# Patient Record
Sex: Male | Born: 1935
Health system: Southern US, Community
[De-identification: ages and names within clinical notes are randomized; demographics above are authoritative.]

## PROBLEM LIST (undated history)

## (undated) DIAGNOSIS — L03116 Cellulitis of left lower limb: Secondary | ICD-10-CM

## (undated) DIAGNOSIS — D591 Autoimmune hemolytic anemia, unspecified: Secondary | ICD-10-CM

## (undated) DIAGNOSIS — E559 Vitamin D deficiency, unspecified: Secondary | ICD-10-CM

## (undated) DIAGNOSIS — I10 Essential (primary) hypertension: Secondary | ICD-10-CM

## (undated) DIAGNOSIS — F32A Depression, unspecified: Secondary | ICD-10-CM

## (undated) DIAGNOSIS — I255 Ischemic cardiomyopathy: Secondary | ICD-10-CM

## (undated) DIAGNOSIS — R634 Abnormal weight loss: Secondary | ICD-10-CM

## (undated) DIAGNOSIS — I219 Acute myocardial infarction, unspecified: Secondary | ICD-10-CM

## (undated) DIAGNOSIS — I509 Heart failure, unspecified: Secondary | ICD-10-CM

## (undated) DIAGNOSIS — Z974 Presence of external hearing-aid: Secondary | ICD-10-CM

## (undated) DIAGNOSIS — I4891 Unspecified atrial fibrillation: Secondary | ICD-10-CM

## (undated) DIAGNOSIS — I679 Cerebrovascular disease, unspecified: Secondary | ICD-10-CM

## (undated) DIAGNOSIS — G4733 Obstructive sleep apnea (adult) (pediatric): Secondary | ICD-10-CM

## (undated) DIAGNOSIS — Z973 Presence of spectacles and contact lenses: Secondary | ICD-10-CM

## (undated) DIAGNOSIS — F329 Major depressive disorder, single episode, unspecified: Secondary | ICD-10-CM

## (undated) DIAGNOSIS — C44300 Unspecified malignant neoplasm of skin of unspecified part of face: Secondary | ICD-10-CM

## (undated) DIAGNOSIS — M1990 Unspecified osteoarthritis, unspecified site: Secondary | ICD-10-CM

## (undated) DIAGNOSIS — N189 Chronic kidney disease, unspecified: Secondary | ICD-10-CM

## (undated) DIAGNOSIS — L02419 Cutaneous abscess of limb, unspecified: Secondary | ICD-10-CM

## (undated) DIAGNOSIS — R599 Enlarged lymph nodes, unspecified: Secondary | ICD-10-CM

## (undated) DIAGNOSIS — E119 Type 2 diabetes mellitus without complications: Secondary | ICD-10-CM

## (undated) DIAGNOSIS — Z9989 Dependence on other enabling machines and devices: Secondary | ICD-10-CM

## (undated) DIAGNOSIS — G2581 Restless legs syndrome: Secondary | ICD-10-CM

## (undated) DIAGNOSIS — Z95810 Presence of automatic (implantable) cardiac defibrillator: Secondary | ICD-10-CM

## (undated) DIAGNOSIS — Z9189 Other specified personal risk factors, not elsewhere classified: Secondary | ICD-10-CM

## (undated) DIAGNOSIS — R351 Nocturia: Secondary | ICD-10-CM

## (undated) DIAGNOSIS — E785 Hyperlipidemia, unspecified: Secondary | ICD-10-CM

## (undated) DIAGNOSIS — L03119 Cellulitis of unspecified part of limb: Secondary | ICD-10-CM

## (undated) DIAGNOSIS — C4490 Unspecified malignant neoplasm of skin, unspecified: Secondary | ICD-10-CM

## (undated) HISTORY — PX: COLONOSCOPY W/ BIOPSIES AND POLYPECTOMY: SHX1376

## (undated) HISTORY — DX: Abnormal weight loss: R63.4

## (undated) HISTORY — DX: Nocturia: R35.1

## (undated) HISTORY — DX: Restless legs syndrome: G25.81

## (undated) HISTORY — DX: Hyperlipidemia, unspecified: E78.5

## (undated) HISTORY — DX: Cerebrovascular disease, unspecified: I67.9

## (undated) HISTORY — DX: Cellulitis of left lower limb: L03.116

## (undated) HISTORY — DX: Vitamin D deficiency, unspecified: E55.9

## (undated) HISTORY — DX: Other autoimmune hemolytic anemias: D59.1

## (undated) HISTORY — DX: Type 2 diabetes mellitus without complications: E11.9

## (undated) HISTORY — PX: CATARACT EXTRACTION W/ INTRAOCULAR LENS  IMPLANT, BILATERAL: SHX1307

## (undated) HISTORY — PX: MOHS SURGERY: SUR867

## (undated) HISTORY — DX: Essential (primary) hypertension: I10

## (undated) HISTORY — DX: Enlarged lymph nodes, unspecified: R59.9

## (undated) HISTORY — DX: Ischemic cardiomyopathy: I25.5

## (undated) HISTORY — DX: Autoimmune hemolytic anemia, unspecified: D59.10

## (undated) HISTORY — DX: Other specified personal risk factors, not elsewhere classified: Z91.89

## (undated) HISTORY — PX: CARDIAC CATHETERIZATION: SHX172

## (undated) HISTORY — DX: Unspecified atrial fibrillation: I48.91

---

## 1980-03-19 HISTORY — PX: CHOLECYSTECTOMY: SHX55

## 1980-03-19 HISTORY — PX: APPENDECTOMY: SHX54

## 1998-11-07 ENCOUNTER — Other Ambulatory Visit: Payer: Self-pay | Admitting: Plastic Surgery

## 2001-04-03 ENCOUNTER — Encounter (INDEPENDENT_AMBULATORY_CARE_PROVIDER_SITE_OTHER): Payer: Self-pay | Admitting: Gastroenterology

## 2003-03-20 DIAGNOSIS — I219 Acute myocardial infarction, unspecified: Secondary | ICD-10-CM

## 2003-03-20 HISTORY — PX: CARDIAC DEFIBRILLATOR PLACEMENT: SHX171

## 2003-03-20 HISTORY — DX: Acute myocardial infarction, unspecified: I21.9

## 2003-03-20 HISTORY — PX: CORONARY ARTERY BYPASS GRAFT: SHX141

## 2003-03-20 HISTORY — PX: VENTRICULAR RESECTION / REPAIR ANEURYSM: SUR1434

## 2003-06-10 ENCOUNTER — Encounter (INDEPENDENT_AMBULATORY_CARE_PROVIDER_SITE_OTHER): Payer: Self-pay | Admitting: Gastroenterology

## 2003-06-10 ENCOUNTER — Emergency Department (HOSPITAL_COMMUNITY): Admission: EM | Admit: 2003-06-10 | Discharge: 2003-06-10 | Payer: Self-pay | Admitting: Emergency Medicine

## 2003-09-06 ENCOUNTER — Inpatient Hospital Stay (HOSPITAL_COMMUNITY): Admission: EM | Admit: 2003-09-06 | Discharge: 2003-09-20 | Payer: Self-pay | Admitting: *Deleted

## 2003-09-07 ENCOUNTER — Encounter: Payer: Self-pay | Admitting: Cardiology

## 2003-09-10 ENCOUNTER — Encounter: Payer: Self-pay | Admitting: Cardiology

## 2003-09-13 ENCOUNTER — Encounter (INDEPENDENT_AMBULATORY_CARE_PROVIDER_SITE_OTHER): Payer: Self-pay | Admitting: Specialist

## 2003-10-18 ENCOUNTER — Encounter (HOSPITAL_COMMUNITY): Admission: RE | Admit: 2003-10-18 | Discharge: 2004-01-16 | Payer: Self-pay | Admitting: Cardiology

## 2004-02-03 ENCOUNTER — Ambulatory Visit: Payer: Self-pay | Admitting: Cardiology

## 2004-02-03 ENCOUNTER — Ambulatory Visit: Payer: Self-pay | Admitting: Family Medicine

## 2004-02-03 ENCOUNTER — Ambulatory Visit: Payer: Self-pay

## 2004-02-11 ENCOUNTER — Ambulatory Visit: Payer: Self-pay | Admitting: Cardiology

## 2004-02-22 ENCOUNTER — Ambulatory Visit: Payer: Self-pay | Admitting: Internal Medicine

## 2004-02-22 ENCOUNTER — Ambulatory Visit: Payer: Self-pay

## 2004-03-01 ENCOUNTER — Ambulatory Visit: Payer: Self-pay | Admitting: Cardiology

## 2004-03-01 ENCOUNTER — Ambulatory Visit: Payer: Self-pay | Admitting: Internal Medicine

## 2004-03-07 ENCOUNTER — Ambulatory Visit: Payer: Self-pay | Admitting: Internal Medicine

## 2004-03-08 ENCOUNTER — Inpatient Hospital Stay (HOSPITAL_COMMUNITY): Admission: AD | Admit: 2004-03-08 | Discharge: 2004-03-09 | Payer: Self-pay | Admitting: Internal Medicine

## 2004-03-15 ENCOUNTER — Ambulatory Visit: Payer: Self-pay | Admitting: Internal Medicine

## 2004-03-23 ENCOUNTER — Ambulatory Visit: Payer: Self-pay | Admitting: Internal Medicine

## 2004-03-23 ENCOUNTER — Ambulatory Visit: Payer: Self-pay

## 2004-04-06 ENCOUNTER — Ambulatory Visit: Payer: Self-pay | Admitting: Cardiology

## 2004-04-27 ENCOUNTER — Ambulatory Visit: Payer: Self-pay | Admitting: Cardiology

## 2004-05-02 ENCOUNTER — Ambulatory Visit: Payer: Self-pay | Admitting: Internal Medicine

## 2004-05-02 ENCOUNTER — Ambulatory Visit: Payer: Self-pay

## 2004-05-11 ENCOUNTER — Ambulatory Visit: Payer: Self-pay | Admitting: Internal Medicine

## 2004-05-31 ENCOUNTER — Ambulatory Visit: Payer: Self-pay | Admitting: *Deleted

## 2004-05-31 ENCOUNTER — Ambulatory Visit: Payer: Self-pay | Admitting: Cardiology

## 2004-06-07 ENCOUNTER — Ambulatory Visit: Payer: Self-pay | Admitting: Cardiology

## 2004-06-13 ENCOUNTER — Ambulatory Visit: Payer: Self-pay | Admitting: Family Medicine

## 2004-06-15 ENCOUNTER — Ambulatory Visit: Payer: Self-pay | Admitting: Internal Medicine

## 2004-07-06 ENCOUNTER — Ambulatory Visit: Payer: Self-pay | Admitting: Family Medicine

## 2004-07-13 ENCOUNTER — Ambulatory Visit: Payer: Self-pay | Admitting: Cardiology

## 2004-08-10 ENCOUNTER — Ambulatory Visit: Payer: Self-pay | Admitting: Cardiology

## 2004-08-18 ENCOUNTER — Ambulatory Visit: Payer: Self-pay | Admitting: Cardiology

## 2004-08-18 ENCOUNTER — Ambulatory Visit: Payer: Self-pay | Admitting: *Deleted

## 2004-09-04 ENCOUNTER — Ambulatory Visit: Payer: Self-pay | Admitting: Cardiology

## 2004-09-25 ENCOUNTER — Ambulatory Visit: Payer: Self-pay | Admitting: Cardiology

## 2004-10-03 ENCOUNTER — Encounter: Admission: RE | Admit: 2004-10-03 | Discharge: 2004-10-03 | Payer: Self-pay | Admitting: Family Medicine

## 2004-10-03 ENCOUNTER — Ambulatory Visit: Payer: Self-pay | Admitting: Family Medicine

## 2004-10-23 ENCOUNTER — Ambulatory Visit: Payer: Self-pay | Admitting: Cardiology

## 2004-11-06 ENCOUNTER — Ambulatory Visit: Payer: Self-pay | Admitting: Cardiology

## 2004-11-22 ENCOUNTER — Ambulatory Visit: Payer: Self-pay | Admitting: Cardiovascular Disease

## 2004-12-05 ENCOUNTER — Ambulatory Visit: Payer: Self-pay | Admitting: Internal Medicine

## 2004-12-05 ENCOUNTER — Ambulatory Visit: Payer: Self-pay | Admitting: Cardiology

## 2004-12-19 ENCOUNTER — Ambulatory Visit: Payer: Self-pay | Admitting: Internal Medicine

## 2005-01-08 ENCOUNTER — Ambulatory Visit: Payer: Self-pay | Admitting: Cardiology

## 2005-01-19 ENCOUNTER — Ambulatory Visit: Payer: Self-pay | Admitting: Cardiology

## 2005-01-20 ENCOUNTER — Ambulatory Visit: Payer: Self-pay | Admitting: Family Medicine

## 2005-02-05 ENCOUNTER — Ambulatory Visit: Payer: Self-pay | Admitting: Cardiology

## 2005-02-19 ENCOUNTER — Ambulatory Visit: Payer: Self-pay | Admitting: Cardiology

## 2005-03-20 ENCOUNTER — Ambulatory Visit: Payer: Self-pay | Admitting: *Deleted

## 2005-04-17 ENCOUNTER — Ambulatory Visit: Payer: Self-pay | Admitting: Cardiology

## 2005-05-15 ENCOUNTER — Ambulatory Visit: Payer: Self-pay | Admitting: *Deleted

## 2005-06-05 ENCOUNTER — Ambulatory Visit: Payer: Self-pay | Admitting: Cardiology

## 2005-06-14 ENCOUNTER — Ambulatory Visit: Payer: Self-pay | Admitting: Family Medicine

## 2005-07-03 ENCOUNTER — Ambulatory Visit: Payer: Self-pay | Admitting: Cardiology

## 2005-08-15 ENCOUNTER — Ambulatory Visit: Payer: Self-pay | Admitting: Internal Medicine

## 2005-08-27 ENCOUNTER — Ambulatory Visit: Payer: Self-pay | Admitting: Cardiology

## 2005-09-13 ENCOUNTER — Ambulatory Visit: Payer: Self-pay

## 2005-09-27 ENCOUNTER — Ambulatory Visit: Payer: Self-pay | Admitting: Family Medicine

## 2005-10-21 ENCOUNTER — Ambulatory Visit: Payer: Self-pay | Admitting: Internal Medicine

## 2005-10-31 ENCOUNTER — Ambulatory Visit: Payer: Self-pay | Admitting: Family Medicine

## 2005-11-22 ENCOUNTER — Ambulatory Visit: Payer: Self-pay | Admitting: Family Medicine

## 2005-12-11 ENCOUNTER — Ambulatory Visit: Payer: Self-pay | Admitting: Family Medicine

## 2006-01-30 ENCOUNTER — Ambulatory Visit: Payer: Self-pay | Admitting: Internal Medicine

## 2006-05-01 ENCOUNTER — Ambulatory Visit: Payer: Self-pay | Admitting: Internal Medicine

## 2006-06-06 ENCOUNTER — Ambulatory Visit: Payer: Self-pay | Admitting: Cardiology

## 2006-06-06 LAB — CONVERTED CEMR LAB
AST: 24 units/L (ref 0–37)
Albumin: 3.5 g/dL (ref 3.5–5.2)
Chloride: 102 meq/L (ref 96–112)
GFR calc non Af Amer: 87 mL/min
HDL: 45.8 mg/dL (ref >39.0)
LDL Cholesterol: 40 mg/dL (ref 0–99)
Sodium: 138 meq/L (ref 135–145)
VLDL: 15 mg/dL (ref 0–40)

## 2006-06-18 ENCOUNTER — Ambulatory Visit: Payer: Self-pay | Admitting: Family Medicine

## 2006-06-18 LAB — CONVERTED CEMR LAB
Bilirubin, Direct: 0.3 mg/dL (ref 0.0–0.3)
Creatinine,U: 160.7 mg/dL
Eosinophils Absolute: 0.3 10*3/uL (ref 0.0–0.6)
Eosinophils Relative: 2.8 % (ref 0.0–5.0)
GFR calc Af Amer: 106 mL/min
GFR calc non Af Amer: 87 mL/min
Glucose, Bld: 126 mg/dL — ABNORMAL HIGH (ref 70–99)
HCT: 46 % (ref 39.0–52.0)
Hemoglobin: 15 g/dL (ref 13.0–17.0)
Hgb A1c MFr Bld: 6.6 % — ABNORMAL HIGH (ref 4.6–6.0)
Lymphocytes Relative: 19.3 % (ref 12.0–46.0)
MCV: 92.8 fL (ref 78.0–100.0)
Microalb Creat Ratio: 9.3 mg/g (ref 0.0–30.0)
Monocytes Absolute: 0.8 10*3/uL — ABNORMAL HIGH (ref 0.2–0.7)
Neutro Abs: 6.5 10*3/uL (ref 1.4–7.7)
Neutrophils Relative %: 69.2 % (ref 43.0–77.0)
Platelets: 169 10*3/uL (ref 150–400)
Potassium: 3.8 meq/L (ref 3.5–5.1)
Sodium: 142 meq/L (ref 135–145)
WBC: 9.5 10*3/uL (ref 4.5–10.5)

## 2006-08-20 ENCOUNTER — Ambulatory Visit: Payer: Self-pay | Admitting: Cardiology

## 2006-08-20 LAB — CONVERTED CEMR LAB
Basophils Absolute: 0 10*3/uL (ref 0.0–0.1)
Chloride: 105 meq/L (ref 96–112)
Eosinophils Absolute: 0.1 10*3/uL (ref 0.0–0.6)
GFR calc Af Amer: 93 mL/min
GFR calc non Af Amer: 77 mL/min
HCT: 43.9 % (ref 39.0–52.0)
Lymphocytes Relative: 14.7 % (ref 12.0–46.0)
MCHC: 33.8 g/dL (ref 30.0–36.0)
MCV: 92 fL (ref 78.0–100.0)
Neutro Abs: 8.6 10*3/uL — ABNORMAL HIGH (ref 1.4–7.7)
Neutrophils Relative %: 77.5 % — ABNORMAL HIGH (ref 43.0–77.0)
Platelets: 161 10*3/uL (ref 150–400)
Pro B Natriuretic peptide (BNP): 1116 pg/mL — ABNORMAL HIGH (ref 0.0–100.0)
RBC: 4.77 M/uL (ref 4.22–5.81)
Sodium: 143 meq/L (ref 135–145)

## 2006-08-21 ENCOUNTER — Ambulatory Visit: Payer: Self-pay | Admitting: Cardiology

## 2006-08-28 ENCOUNTER — Encounter: Payer: Self-pay | Admitting: Cardiology

## 2006-08-28 ENCOUNTER — Ambulatory Visit: Payer: Self-pay

## 2006-09-13 ENCOUNTER — Ambulatory Visit: Payer: Self-pay | Admitting: Cardiology

## 2006-09-25 ENCOUNTER — Encounter: Payer: Self-pay | Admitting: Family Medicine

## 2006-09-25 DIAGNOSIS — I10 Essential (primary) hypertension: Secondary | ICD-10-CM | POA: Insufficient documentation

## 2006-09-25 DIAGNOSIS — E785 Hyperlipidemia, unspecified: Secondary | ICD-10-CM | POA: Insufficient documentation

## 2006-09-26 ENCOUNTER — Encounter: Payer: Self-pay | Admitting: Family Medicine

## 2006-09-26 ENCOUNTER — Ambulatory Visit: Payer: Self-pay | Admitting: Family Medicine

## 2006-10-30 ENCOUNTER — Ambulatory Visit: Payer: Self-pay | Admitting: Internal Medicine

## 2006-11-07 ENCOUNTER — Ambulatory Visit: Payer: Self-pay | Admitting: Family Medicine

## 2006-11-26 ENCOUNTER — Ambulatory Visit: Payer: Self-pay | Admitting: Cardiology

## 2006-11-26 LAB — CONVERTED CEMR LAB
ALT: 21 units/L (ref 0–53)
AST: 24 units/L (ref 0–37)
Albumin: 3.5 g/dL (ref 3.5–5.2)
Alkaline Phosphatase: 64 units/L (ref 39–117)
BUN: 18 mg/dL (ref 6–23)
CO2: 29 meq/L (ref 19–32)
Calcium: 9.3 mg/dL (ref 8.4–10.5)
Chloride: 104 meq/L (ref 96–112)
Creatinine, Ser: 1 mg/dL (ref 0.4–1.5)
Potassium: 3.9 meq/L (ref 3.5–5.1)
Total Bilirubin: 0.8 mg/dL (ref 0.3–1.2)
Total Protein: 6.1 g/dL (ref 6.0–8.3)
Triglycerides: 117 mg/dL (ref 0–149)
VLDL: 23 mg/dL (ref 0–40)

## 2006-12-05 ENCOUNTER — Ambulatory Visit: Payer: Self-pay | Admitting: Family Medicine

## 2006-12-12 ENCOUNTER — Encounter: Payer: Self-pay | Admitting: Family Medicine

## 2006-12-17 ENCOUNTER — Encounter (INDEPENDENT_AMBULATORY_CARE_PROVIDER_SITE_OTHER): Payer: Self-pay | Admitting: *Deleted

## 2007-01-09 ENCOUNTER — Ambulatory Visit: Payer: Self-pay | Admitting: Family Medicine

## 2007-01-27 ENCOUNTER — Ambulatory Visit: Payer: Self-pay | Admitting: Internal Medicine

## 2007-03-27 ENCOUNTER — Ambulatory Visit: Payer: Self-pay | Admitting: Family Medicine

## 2007-04-30 ENCOUNTER — Ambulatory Visit: Payer: Self-pay | Admitting: Internal Medicine

## 2007-06-11 ENCOUNTER — Encounter: Payer: Self-pay | Admitting: Family Medicine

## 2007-06-11 ENCOUNTER — Ambulatory Visit: Payer: Self-pay | Admitting: Cardiology

## 2007-06-17 ENCOUNTER — Ambulatory Visit: Payer: Self-pay

## 2007-06-17 ENCOUNTER — Encounter: Payer: Self-pay | Admitting: Family Medicine

## 2007-06-17 LAB — CONVERTED CEMR LAB
Alkaline Phosphatase: 40 units/L (ref 39–117)
Bilirubin, Direct: 0.2 mg/dL (ref 0.0–0.3)
Calcium: 9.4 mg/dL (ref 8.4–10.5)
GFR calc Af Amer: 121 mL/min
GFR calc non Af Amer: 100 mL/min
Glucose, Bld: 145 mg/dL — ABNORMAL HIGH (ref 70–99)
HDL: 43.5 mg/dL (ref >39.0)
LDL Cholesterol: 44 mg/dL (ref 0–99)
Potassium: 4.3 meq/L (ref 3.5–5.1)
Sodium: 140 meq/L (ref 135–145)
Total CHOL/HDL Ratio: 2.4
Total Protein: 6 g/dL (ref 6.0–8.3)
VLDL: 17 mg/dL (ref 0–40)

## 2007-06-19 ENCOUNTER — Ambulatory Visit: Payer: Self-pay | Admitting: Family Medicine

## 2007-06-30 LAB — CONVERTED CEMR LAB
Basophils Relative: 0.2 % (ref 0.0–1.0)
HCT: 44.4 % (ref 39.0–52.0)
Hemoglobin: 14.2 g/dL (ref 13.0–17.0)
Lymphocytes Relative: 23.7 % (ref 12.0–46.0)
MCHC: 31.9 g/dL (ref 30.0–36.0)
Monocytes Absolute: 0.8 10*3/uL (ref 0.1–1.0)
Monocytes Relative: 8.2 % (ref 3.0–12.0)
Neutro Abs: 6.1 10*3/uL (ref 1.4–7.7)
RBC: 4.62 M/uL (ref 4.22–5.81)
RDW: 13.7 % (ref 11.5–14.6)

## 2007-07-02 ENCOUNTER — Ambulatory Visit: Payer: Self-pay | Admitting: Family Medicine

## 2007-07-02 LAB — CONVERTED CEMR LAB
OCCULT 2: NEGATIVE
OCCULT 3: NEGATIVE

## 2007-07-03 ENCOUNTER — Encounter: Payer: Self-pay | Admitting: Family Medicine

## 2007-07-09 ENCOUNTER — Telehealth: Payer: Self-pay | Admitting: Family Medicine

## 2007-07-10 ENCOUNTER — Telehealth: Payer: Self-pay | Admitting: Family Medicine

## 2007-07-22 ENCOUNTER — Ambulatory Visit: Payer: Self-pay | Admitting: Internal Medicine

## 2007-07-22 LAB — CONVERTED CEMR LAB
CO2: 31 meq/L (ref 19–32)
Chloride: 103 meq/L (ref 96–112)
GFR calc non Af Amer: 87 mL/min
Potassium: 4.1 meq/L (ref 3.5–5.1)

## 2007-07-30 ENCOUNTER — Encounter: Payer: Self-pay | Admitting: Family Medicine

## 2007-09-02 ENCOUNTER — Ambulatory Visit: Payer: Self-pay

## 2007-10-20 ENCOUNTER — Ambulatory Visit: Payer: Self-pay | Admitting: Internal Medicine

## 2007-12-24 ENCOUNTER — Ambulatory Visit: Payer: Self-pay | Admitting: Family Medicine

## 2008-01-15 ENCOUNTER — Ambulatory Visit: Payer: Self-pay | Admitting: Internal Medicine

## 2008-01-15 ENCOUNTER — Inpatient Hospital Stay (HOSPITAL_COMMUNITY): Admission: EM | Admit: 2008-01-15 | Discharge: 2008-01-16 | Payer: Self-pay | Admitting: Emergency Medicine

## 2008-01-16 ENCOUNTER — Ambulatory Visit: Payer: Self-pay | Admitting: Cardiology

## 2008-01-16 ENCOUNTER — Encounter: Payer: Self-pay | Admitting: Internal Medicine

## 2008-01-19 ENCOUNTER — Ambulatory Visit: Payer: Self-pay | Admitting: Internal Medicine

## 2008-01-28 ENCOUNTER — Ambulatory Visit: Payer: Self-pay | Admitting: Family Medicine

## 2008-02-03 ENCOUNTER — Ambulatory Visit: Payer: Self-pay | Admitting: Cardiology

## 2008-02-03 LAB — CONVERTED CEMR LAB
CO2: 26 meq/L (ref 19–32)
Chloride: 106 meq/L (ref 96–112)
Glucose, Bld: 127 mg/dL — ABNORMAL HIGH (ref 70–99)
Potassium: 4.6 meq/L (ref 3.5–5.1)
Sodium: 140 meq/L (ref 135–145)

## 2008-02-04 ENCOUNTER — Telehealth: Payer: Self-pay | Admitting: Family Medicine

## 2008-04-19 ENCOUNTER — Ambulatory Visit: Payer: Self-pay | Admitting: Internal Medicine

## 2008-04-27 ENCOUNTER — Ambulatory Visit: Payer: Self-pay | Admitting: Cardiology

## 2008-04-27 LAB — CONVERTED CEMR LAB
AST: 28 units/L (ref 0–37)
Albumin: 3.5 g/dL (ref 3.5–5.2)
BUN: 25 mg/dL — ABNORMAL HIGH (ref 6–23)
Calcium: 9.5 mg/dL (ref 8.4–10.5)
Chloride: 105 meq/L (ref 96–112)
Cholesterol: 90 mg/dL (ref 0–200)
Creatinine, Ser: 1.1 mg/dL (ref 0.4–1.5)
GFR calc Af Amer: 83 mL/min
GFR calc non Af Amer: 69 mL/min
HDL: 30.9 mg/dL — ABNORMAL LOW (ref >39.0)
LDL Cholesterol: 27 mg/dL (ref 0–99)
Triglycerides: 161 mg/dL — ABNORMAL HIGH (ref 0–149)
VLDL: 32 mg/dL (ref 0–40)

## 2008-05-04 ENCOUNTER — Ambulatory Visit: Payer: Self-pay | Admitting: Family Medicine

## 2008-05-06 LAB — CONVERTED CEMR LAB: Hgb A1c MFr Bld: 8 % — ABNORMAL HIGH (ref 4.6–6.0)

## 2008-05-12 ENCOUNTER — Encounter (INDEPENDENT_AMBULATORY_CARE_PROVIDER_SITE_OTHER): Payer: Self-pay | Admitting: *Deleted

## 2008-05-17 ENCOUNTER — Telehealth: Payer: Self-pay | Admitting: Gastroenterology

## 2008-06-14 ENCOUNTER — Ambulatory Visit: Payer: Self-pay | Admitting: Gastroenterology

## 2008-06-15 ENCOUNTER — Encounter: Payer: Self-pay | Admitting: Internal Medicine

## 2008-06-23 ENCOUNTER — Ambulatory Visit: Payer: Self-pay | Admitting: Gastroenterology

## 2008-06-29 ENCOUNTER — Encounter: Payer: Self-pay | Admitting: Family Medicine

## 2008-07-14 ENCOUNTER — Ambulatory Visit: Payer: Self-pay | Admitting: Family Medicine

## 2008-07-14 DIAGNOSIS — G2581 Restless legs syndrome: Secondary | ICD-10-CM | POA: Insufficient documentation

## 2008-07-14 DIAGNOSIS — E039 Hypothyroidism, unspecified: Secondary | ICD-10-CM

## 2008-07-14 DIAGNOSIS — E559 Vitamin D deficiency, unspecified: Secondary | ICD-10-CM | POA: Insufficient documentation

## 2008-07-14 DIAGNOSIS — R351 Nocturia: Secondary | ICD-10-CM | POA: Insufficient documentation

## 2008-07-14 LAB — CONVERTED CEMR LAB
Glucose, Urine, Semiquant: NEGATIVE
Nitrite: NEGATIVE
Specific Gravity, Urine: 1.02

## 2008-07-15 LAB — CONVERTED CEMR LAB
Albumin: 3.5 g/dL (ref 3.5–5.2)
Basophils Relative: 0.2 % (ref 0.0–3.0)
CO2: 31 meq/L (ref 19–32)
Chloride: 106 meq/L (ref 96–112)
Creatinine,U: 190.7 mg/dL
Eosinophils Absolute: 0.2 10*3/uL (ref 0.0–0.7)
Hemoglobin: 14.3 g/dL (ref 13.0–17.0)
Lymphs Abs: 1.9 10*3/uL (ref 0.7–4.0)
MCHC: 33.7 g/dL (ref 30.0–36.0)
MCV: 97.4 fL (ref 78.0–100.0)
Microalb, Ur: 1.2 mg/dL (ref 0.0–1.9)
Monocytes Absolute: 0.8 10*3/uL (ref 0.1–1.0)
Neutro Abs: 8.7 10*3/uL — ABNORMAL HIGH (ref 1.4–7.7)
RBC: 4.35 M/uL (ref 4.22–5.81)
Sodium: 141 meq/L (ref 135–145)
Total CHOL/HDL Ratio: 3
Total Protein: 6.3 g/dL (ref 6.0–8.3)
Triglycerides: 163 mg/dL — ABNORMAL HIGH (ref 0.0–149.0)

## 2008-07-16 LAB — CONVERTED CEMR LAB: Vit D, 25-Hydroxy: 17 ng/mL — ABNORMAL LOW (ref 30–89)

## 2008-07-20 ENCOUNTER — Ambulatory Visit: Payer: Self-pay | Admitting: Internal Medicine

## 2008-07-20 DIAGNOSIS — I5022 Chronic systolic (congestive) heart failure: Secondary | ICD-10-CM | POA: Insufficient documentation

## 2008-07-27 ENCOUNTER — Ambulatory Visit: Payer: Self-pay | Admitting: Family Medicine

## 2008-07-27 LAB — CONVERTED CEMR LAB: OCCULT 3: NEGATIVE

## 2008-08-18 ENCOUNTER — Encounter: Payer: Self-pay | Admitting: Family Medicine

## 2008-09-02 ENCOUNTER — Ambulatory Visit: Payer: Self-pay

## 2008-09-02 ENCOUNTER — Encounter: Payer: Self-pay | Admitting: Cardiology

## 2008-09-23 ENCOUNTER — Telehealth (INDEPENDENT_AMBULATORY_CARE_PROVIDER_SITE_OTHER): Payer: Self-pay | Admitting: *Deleted

## 2008-09-23 ENCOUNTER — Encounter: Payer: Self-pay | Admitting: Cardiology

## 2008-10-06 ENCOUNTER — Ambulatory Visit: Payer: Self-pay | Admitting: Family Medicine

## 2008-10-08 LAB — CONVERTED CEMR LAB: Hgb A1c MFr Bld: 7.3 % — ABNORMAL HIGH (ref 4.6–6.5)

## 2008-10-18 ENCOUNTER — Ambulatory Visit: Payer: Self-pay | Admitting: Internal Medicine

## 2008-10-20 ENCOUNTER — Telehealth: Payer: Self-pay | Admitting: Family Medicine

## 2008-10-26 ENCOUNTER — Encounter: Payer: Self-pay | Admitting: Internal Medicine

## 2009-01-17 ENCOUNTER — Ambulatory Visit: Payer: Self-pay | Admitting: Internal Medicine

## 2009-01-24 ENCOUNTER — Encounter: Payer: Self-pay | Admitting: Internal Medicine

## 2009-01-26 ENCOUNTER — Ambulatory Visit: Payer: Self-pay | Admitting: Family Medicine

## 2009-01-27 DIAGNOSIS — I679 Cerebrovascular disease, unspecified: Secondary | ICD-10-CM | POA: Insufficient documentation

## 2009-01-31 ENCOUNTER — Ambulatory Visit: Payer: Self-pay | Admitting: Cardiology

## 2009-02-01 ENCOUNTER — Ambulatory Visit: Payer: Self-pay | Admitting: Cardiology

## 2009-02-02 ENCOUNTER — Encounter (INDEPENDENT_AMBULATORY_CARE_PROVIDER_SITE_OTHER): Payer: Self-pay | Admitting: *Deleted

## 2009-02-02 LAB — CONVERTED CEMR LAB
ALT: 20 units/L (ref 0–53)
AST: 19 units/L (ref 0–37)
Calcium: 9.3 mg/dL (ref 8.4–10.5)
Creatinine, Ser: 1.1 mg/dL (ref 0.4–1.5)
GFR calc non Af Amer: 68.69 mL/min (ref >60)
Glucose, Bld: 152 mg/dL — ABNORMAL HIGH (ref 70–99)
HDL: 37.6 mg/dL — ABNORMAL LOW (ref >39.00)
Sodium: 143 meq/L (ref 135–145)
Total Bilirubin: 0.8 mg/dL (ref 0.3–1.2)
Triglycerides: 162 mg/dL — ABNORMAL HIGH (ref 0.0–149.0)

## 2009-03-03 ENCOUNTER — Encounter: Payer: Self-pay | Admitting: Cardiology

## 2009-03-04 ENCOUNTER — Ambulatory Visit: Payer: Self-pay

## 2009-03-04 ENCOUNTER — Encounter: Payer: Self-pay | Admitting: Cardiology

## 2009-04-18 ENCOUNTER — Ambulatory Visit: Payer: Self-pay | Admitting: Internal Medicine

## 2009-04-20 ENCOUNTER — Encounter: Payer: Self-pay | Admitting: Internal Medicine

## 2009-04-20 ENCOUNTER — Ambulatory Visit: Payer: Self-pay | Admitting: Family Medicine

## 2009-04-20 DIAGNOSIS — M1610 Unilateral primary osteoarthritis, unspecified hip: Secondary | ICD-10-CM | POA: Insufficient documentation

## 2009-04-21 ENCOUNTER — Ambulatory Visit: Payer: Self-pay | Admitting: Family Medicine

## 2009-07-04 ENCOUNTER — Encounter: Payer: Self-pay | Admitting: Family Medicine

## 2009-07-19 ENCOUNTER — Ambulatory Visit: Payer: Self-pay | Admitting: Internal Medicine

## 2009-07-19 DIAGNOSIS — G4733 Obstructive sleep apnea (adult) (pediatric): Secondary | ICD-10-CM | POA: Insufficient documentation

## 2009-07-19 HISTORY — DX: Obstructive sleep apnea (adult) (pediatric): G47.33

## 2009-07-25 ENCOUNTER — Encounter: Payer: Self-pay | Admitting: Internal Medicine

## 2009-07-27 ENCOUNTER — Ambulatory Visit: Payer: Self-pay | Admitting: Family Medicine

## 2009-07-27 DIAGNOSIS — T50995A Adverse effect of other drugs, medicaments and biological substances, initial encounter: Secondary | ICD-10-CM | POA: Insufficient documentation

## 2009-08-04 LAB — CONVERTED CEMR LAB
Albumin: 3.5 g/dL (ref 3.5–5.2)
Basophils Absolute: 0 10*3/uL (ref 0.0–0.1)
Basophils Relative: 0.1 % (ref 0.0–3.0)
CO2: 31 meq/L (ref 19–32)
Calcium: 9.8 mg/dL (ref 8.4–10.5)
Chloride: 103 meq/L (ref 96–112)
Eosinophils Absolute: 0.4 10*3/uL (ref 0.0–0.7)
Glucose, Bld: 125 mg/dL — ABNORMAL HIGH (ref 70–99)
HCT: 39 % (ref 39.0–52.0)
HDL: 34 mg/dL — ABNORMAL LOW (ref >39.00)
Hemoglobin: 13.1 g/dL (ref 13.0–17.0)
Lymphs Abs: 2.2 10*3/uL (ref 0.7–4.0)
MCHC: 33.5 g/dL (ref 30.0–36.0)
MCV: 99.2 fL (ref 78.0–100.0)
Neutro Abs: 5.4 10*3/uL (ref 1.4–7.7)
PSA: 1.99 ng/mL (ref 0.10–4.00)
Potassium: 5.2 meq/L — ABNORMAL HIGH (ref 3.5–5.1)
RDW: 14.2 % (ref 11.5–14.6)
Sodium: 144 meq/L (ref 135–145)
TSH: 1.35 microintl units/mL (ref 0.35–5.50)
Total Protein: 6.4 g/dL (ref 6.0–8.3)
Vit D, 25-Hydroxy: 16 ng/mL — ABNORMAL LOW (ref 30–89)

## 2009-08-05 ENCOUNTER — Telehealth (INDEPENDENT_AMBULATORY_CARE_PROVIDER_SITE_OTHER): Payer: Self-pay | Admitting: *Deleted

## 2009-08-10 ENCOUNTER — Encounter (INDEPENDENT_AMBULATORY_CARE_PROVIDER_SITE_OTHER): Payer: Self-pay | Admitting: *Deleted

## 2009-08-17 ENCOUNTER — Ambulatory Visit: Payer: Self-pay | Admitting: Family Medicine

## 2009-08-22 ENCOUNTER — Ambulatory Visit: Payer: Self-pay | Admitting: Pulmonary Disease

## 2009-08-30 ENCOUNTER — Telehealth: Payer: Self-pay | Admitting: Pulmonary Disease

## 2009-09-01 ENCOUNTER — Encounter: Payer: Self-pay | Admitting: Cardiology

## 2009-09-02 ENCOUNTER — Ambulatory Visit: Payer: Self-pay | Admitting: Cardiology

## 2009-09-05 ENCOUNTER — Encounter: Payer: Self-pay | Admitting: Family Medicine

## 2009-09-07 ENCOUNTER — Encounter: Payer: Self-pay | Admitting: Pulmonary Disease

## 2009-09-14 ENCOUNTER — Encounter: Payer: Self-pay | Admitting: Pulmonary Disease

## 2009-10-02 ENCOUNTER — Encounter: Payer: Self-pay | Admitting: Pulmonary Disease

## 2009-10-20 ENCOUNTER — Ambulatory Visit: Payer: Self-pay | Admitting: Internal Medicine

## 2009-10-23 ENCOUNTER — Encounter: Payer: Self-pay | Admitting: Pulmonary Disease

## 2009-10-24 ENCOUNTER — Ambulatory Visit: Payer: Self-pay | Admitting: Pulmonary Disease

## 2009-10-24 DIAGNOSIS — J310 Chronic rhinitis: Secondary | ICD-10-CM | POA: Insufficient documentation

## 2009-11-04 ENCOUNTER — Encounter: Payer: Self-pay | Admitting: Internal Medicine

## 2009-11-07 ENCOUNTER — Ambulatory Visit: Payer: Self-pay | Admitting: Cardiology

## 2009-11-19 ENCOUNTER — Encounter: Payer: Self-pay | Admitting: Cardiology

## 2009-11-19 ENCOUNTER — Encounter: Payer: Self-pay | Admitting: Emergency Medicine

## 2009-11-19 ENCOUNTER — Ambulatory Visit: Payer: Self-pay | Admitting: Cardiology

## 2009-11-19 ENCOUNTER — Inpatient Hospital Stay (HOSPITAL_COMMUNITY): Admission: EM | Admit: 2009-11-19 | Discharge: 2009-11-21 | Payer: Self-pay | Admitting: Cardiology

## 2009-11-24 ENCOUNTER — Ambulatory Visit: Payer: Self-pay | Admitting: Cardiology

## 2009-11-24 LAB — CONVERTED CEMR LAB
BUN: 29 mg/dL — ABNORMAL HIGH (ref 6–23)
Chloride: 98 meq/L (ref 96–112)
Creatinine, Ser: 1.3 mg/dL (ref 0.4–1.5)
Glucose, Bld: 165 mg/dL — ABNORMAL HIGH (ref 70–99)
Potassium: 4.7 meq/L (ref 3.5–5.1)

## 2009-11-28 ENCOUNTER — Telehealth (INDEPENDENT_AMBULATORY_CARE_PROVIDER_SITE_OTHER): Payer: Self-pay | Admitting: *Deleted

## 2009-11-29 ENCOUNTER — Ambulatory Visit: Payer: Self-pay

## 2009-11-29 ENCOUNTER — Encounter: Payer: Self-pay | Admitting: Cardiology

## 2009-11-29 ENCOUNTER — Encounter (HOSPITAL_COMMUNITY): Admission: RE | Admit: 2009-11-29 | Discharge: 2010-01-18 | Payer: Self-pay | Admitting: Cardiology

## 2009-11-29 ENCOUNTER — Ambulatory Visit: Payer: Self-pay | Admitting: Cardiology

## 2009-11-29 ENCOUNTER — Ambulatory Visit (HOSPITAL_COMMUNITY): Admission: RE | Admit: 2009-11-29 | Discharge: 2009-11-29 | Payer: Self-pay | Admitting: Cardiology

## 2009-12-05 ENCOUNTER — Ambulatory Visit: Payer: Self-pay | Admitting: Family Medicine

## 2009-12-05 LAB — CONVERTED CEMR LAB
BUN: 37 mg/dL — ABNORMAL HIGH (ref 6–23)
CO2: 27 meq/L (ref 19–32)
Calcium: 9.5 mg/dL (ref 8.4–10.5)
Creatinine, Ser: 1.5 mg/dL (ref 0.4–1.5)
Glucose, Bld: 240 mg/dL — ABNORMAL HIGH (ref 70–99)
Sodium: 138 meq/L (ref 135–145)
TSH: 0.61 microintl units/mL (ref 0.35–5.50)

## 2009-12-06 ENCOUNTER — Ambulatory Visit: Payer: Self-pay | Admitting: Family Medicine

## 2009-12-07 LAB — CONVERTED CEMR LAB
Albumin: 3.1 g/dL — ABNORMAL LOW (ref 3.5–5.2)
BUN: 35 mg/dL — ABNORMAL HIGH (ref 6–23)
CO2: 25 meq/L (ref 19–32)
Calcium: 9.6 mg/dL (ref 8.4–10.5)
Chloride: 101 meq/L (ref 96–112)
Creatinine, Ser: 1.4 mg/dL (ref 0.4–1.5)

## 2009-12-08 ENCOUNTER — Ambulatory Visit: Payer: Self-pay | Admitting: Pulmonary Disease

## 2009-12-12 ENCOUNTER — Telehealth: Payer: Self-pay | Admitting: Family Medicine

## 2009-12-13 ENCOUNTER — Telehealth: Payer: Self-pay | Admitting: Cardiology

## 2009-12-16 ENCOUNTER — Inpatient Hospital Stay (HOSPITAL_COMMUNITY): Admission: EM | Admit: 2009-12-16 | Discharge: 2009-12-21 | Payer: Self-pay | Admitting: Emergency Medicine

## 2009-12-16 ENCOUNTER — Telehealth: Payer: Self-pay | Admitting: Family Medicine

## 2009-12-16 ENCOUNTER — Ambulatory Visit: Payer: Self-pay | Admitting: Cardiovascular Disease

## 2009-12-18 ENCOUNTER — Ambulatory Visit: Payer: Self-pay | Admitting: Hematology and Oncology

## 2009-12-19 ENCOUNTER — Encounter: Payer: Self-pay | Admitting: Cardiology

## 2009-12-19 ENCOUNTER — Ambulatory Visit: Payer: Self-pay | Admitting: Infectious Diseases

## 2009-12-19 ENCOUNTER — Encounter: Payer: Self-pay | Admitting: Cardiovascular Disease

## 2009-12-23 ENCOUNTER — Ambulatory Visit: Payer: Self-pay | Admitting: Hematology and Oncology

## 2009-12-23 ENCOUNTER — Telehealth: Payer: Self-pay | Admitting: Cardiovascular Disease

## 2009-12-28 ENCOUNTER — Encounter: Payer: Self-pay | Admitting: Family Medicine

## 2010-01-02 ENCOUNTER — Encounter: Payer: Self-pay | Admitting: Family Medicine

## 2010-01-03 ENCOUNTER — Ambulatory Visit: Payer: Self-pay | Admitting: Family Medicine

## 2010-01-03 DIAGNOSIS — R634 Abnormal weight loss: Secondary | ICD-10-CM | POA: Insufficient documentation

## 2010-01-03 DIAGNOSIS — D649 Anemia, unspecified: Secondary | ICD-10-CM | POA: Insufficient documentation

## 2010-01-04 ENCOUNTER — Encounter: Payer: Self-pay | Admitting: Family Medicine

## 2010-01-04 ENCOUNTER — Ambulatory Visit: Payer: Self-pay | Admitting: Infectious Disease

## 2010-01-04 DIAGNOSIS — D591 Autoimmune hemolytic anemia, unspecified: Secondary | ICD-10-CM | POA: Insufficient documentation

## 2010-01-04 DIAGNOSIS — R599 Enlarged lymph nodes, unspecified: Secondary | ICD-10-CM | POA: Insufficient documentation

## 2010-01-04 LAB — CONVERTED CEMR LAB
Basophils Absolute: 0 10*3/uL (ref 0.0–0.1)
CO2: 31 meq/L (ref 19–32)
Calcium: 9.9 mg/dL (ref 8.4–10.5)
Creatinine, Ser: 1.1 mg/dL (ref 0.4–1.5)
EBV VCA IgM: 0.08
Eosinophils Absolute: 0.1 10*3/uL (ref 0.0–0.7)
GFR calc non Af Amer: 71.52 mL/min (ref >60)
HCT: 37.5 % — ABNORMAL LOW (ref 39.0–52.0)
Hemoglobin: 12.5 g/dL — ABNORMAL LOW (ref 13.0–17.0)
Lymphs Abs: 2.5 10*3/uL (ref 0.7–4.0)
MCHC: 33.3 g/dL (ref 30.0–36.0)
MCV: 94.5 fL (ref 78.0–100.0)
Monocytes Absolute: 0.7 10*3/uL (ref 0.1–1.0)
Monocytes Relative: 7.6 % (ref 3.0–12.0)
Neutro Abs: 5.7 10*3/uL (ref 1.4–7.7)
Platelets: 200 10*3/uL (ref 150.0–400.0)
RDW: 17.1 % — ABNORMAL HIGH (ref 11.5–14.6)
Sodium: 140 meq/L (ref 135–145)

## 2010-01-12 ENCOUNTER — Encounter: Payer: Self-pay | Admitting: Cardiology

## 2010-01-16 ENCOUNTER — Telehealth: Payer: Self-pay | Admitting: Family Medicine

## 2010-01-17 ENCOUNTER — Ambulatory Visit: Payer: Self-pay | Admitting: Cardiology

## 2010-01-18 ENCOUNTER — Encounter: Payer: Self-pay | Admitting: Infectious Disease

## 2010-01-18 ENCOUNTER — Encounter: Payer: Self-pay | Admitting: Cardiology

## 2010-01-18 ENCOUNTER — Encounter: Payer: Self-pay | Admitting: Family Medicine

## 2010-01-18 LAB — CBC WITH DIFFERENTIAL/PLATELET
BASO%: 0.2 % (ref 0.0–2.0)
Eosinophils Absolute: 0 10*3/uL (ref 0.0–0.5)
LYMPH%: 19.5 % (ref 14.0–49.0)
MCHC: 33.5 g/dL (ref 32.0–36.0)
MONO#: 1.1 10*3/uL — ABNORMAL HIGH (ref 0.1–0.9)
NEUT#: 6.5 10*3/uL (ref 1.5–6.5)
RBC: 3.98 10*6/uL — ABNORMAL LOW (ref 4.20–5.82)
RDW: 16.2 % — ABNORMAL HIGH (ref 11.0–14.6)
WBC: 9.5 10*3/uL (ref 4.0–10.3)
lymph#: 1.8 10*3/uL (ref 0.9–3.3)

## 2010-01-18 LAB — COMPREHENSIVE METABOLIC PANEL
ALT: 17 U/L (ref 0–53)
Albumin: 3.2 g/dL — ABNORMAL LOW (ref 3.5–5.2)
CO2: 28 mEq/L (ref 19–32)
Chloride: 100 mEq/L (ref 96–112)
Glucose, Bld: 143 mg/dL — ABNORMAL HIGH (ref 70–99)
Potassium: 3.7 mEq/L (ref 3.5–5.3)
Sodium: 138 mEq/L (ref 135–145)
Total Bilirubin: 0.6 mg/dL (ref 0.3–1.2)
Total Protein: 6.7 g/dL (ref 6.0–8.3)

## 2010-01-18 LAB — LACTATE DEHYDROGENASE: LDH: 172 U/L (ref 94–250)

## 2010-01-19 ENCOUNTER — Ambulatory Visit: Payer: Self-pay | Admitting: Internal Medicine

## 2010-01-25 ENCOUNTER — Ambulatory Visit: Payer: Self-pay | Admitting: Cardiology

## 2010-01-25 LAB — CONVERTED CEMR LAB
AST: 25 units/L (ref 0–37)
BUN: 19 mg/dL (ref 6–23)
Bilirubin, Direct: 0.2 mg/dL (ref 0.0–0.3)
CO2: 30 meq/L (ref 19–32)
Glucose, Bld: 96 mg/dL (ref 70–99)
HDL: 26 mg/dL — ABNORMAL LOW (ref >39.00)
Potassium: 4.3 meq/L (ref 3.5–5.1)
Sodium: 141 meq/L (ref 135–145)
Total Bilirubin: 0.5 mg/dL (ref 0.3–1.2)
Total CHOL/HDL Ratio: 3
VLDL: 21.2 mg/dL (ref 0.0–40.0)

## 2010-01-30 ENCOUNTER — Encounter: Payer: Self-pay | Admitting: Family Medicine

## 2010-01-30 ENCOUNTER — Telehealth: Payer: Self-pay | Admitting: Family Medicine

## 2010-01-30 ENCOUNTER — Encounter: Payer: Self-pay | Admitting: Internal Medicine

## 2010-02-01 ENCOUNTER — Telehealth: Payer: Self-pay | Admitting: Cardiology

## 2010-02-03 ENCOUNTER — Ambulatory Visit: Payer: Self-pay | Admitting: Family Medicine

## 2010-02-03 DIAGNOSIS — D485 Neoplasm of uncertain behavior of skin: Secondary | ICD-10-CM | POA: Insufficient documentation

## 2010-02-13 ENCOUNTER — Encounter: Payer: Self-pay | Admitting: Cardiology

## 2010-02-15 ENCOUNTER — Encounter: Payer: Self-pay | Admitting: Cardiology

## 2010-02-16 ENCOUNTER — Encounter: Payer: Self-pay | Admitting: Cardiology

## 2010-02-17 ENCOUNTER — Ambulatory Visit: Payer: Self-pay

## 2010-02-23 ENCOUNTER — Encounter: Payer: Self-pay | Admitting: Cardiology

## 2010-03-02 ENCOUNTER — Ambulatory Visit: Payer: Self-pay | Admitting: Infectious Disease

## 2010-03-07 ENCOUNTER — Encounter: Payer: Self-pay | Admitting: Family Medicine

## 2010-03-08 ENCOUNTER — Encounter: Payer: Self-pay | Admitting: Family Medicine

## 2010-03-23 ENCOUNTER — Ambulatory Visit
Admission: RE | Admit: 2010-03-23 | Discharge: 2010-03-23 | Payer: Self-pay | Source: Home / Self Care | Attending: Family Medicine | Admitting: Family Medicine

## 2010-03-23 ENCOUNTER — Encounter: Payer: Self-pay | Admitting: Family Medicine

## 2010-03-23 ENCOUNTER — Other Ambulatory Visit: Payer: Self-pay | Admitting: Family Medicine

## 2010-03-23 LAB — BASIC METABOLIC PANEL
BUN: 22 mg/dL (ref 6–23)
CO2: 31 mEq/L (ref 19–32)
Calcium: 9.5 mg/dL (ref 8.4–10.5)
Chloride: 100 mEq/L (ref 96–112)
Creatinine, Ser: 1.2 mg/dL (ref 0.4–1.5)
GFR: 63.78 mL/min (ref >60.00)
Glucose, Bld: 128 mg/dL — ABNORMAL HIGH (ref 70–99)
Potassium: 4.4 mEq/L (ref 3.5–5.1)
Sodium: 139 mEq/L (ref 135–145)

## 2010-03-23 LAB — HEMOGLOBIN A1C: Hgb A1c MFr Bld: 7 % — ABNORMAL HIGH (ref 4.6–6.5)

## 2010-03-23 LAB — MICROALBUMIN / CREATININE URINE RATIO
Creatinine,U: 19.3 mg/dL
Microalb Creat Ratio: 9.8 mg/g (ref 0.0–30.0)
Microalb, Ur: 1.9 mg/dL (ref 0.0–1.9)

## 2010-03-28 ENCOUNTER — Encounter: Payer: Self-pay | Admitting: Cardiology

## 2010-03-30 ENCOUNTER — Ambulatory Visit
Admission: RE | Admit: 2010-03-30 | Discharge: 2010-03-30 | Payer: Self-pay | Source: Home / Self Care | Attending: Family Medicine | Admitting: Family Medicine

## 2010-04-04 LAB — CONVERTED CEMR LAB: Digitoxin Lvl: 0.7 ng/mL — ABNORMAL LOW (ref 0.8–2.0)

## 2010-04-10 ENCOUNTER — Ambulatory Visit: Payer: Self-pay | Admitting: Hematology and Oncology

## 2010-04-12 ENCOUNTER — Encounter: Payer: Self-pay | Admitting: Infectious Disease

## 2010-04-12 ENCOUNTER — Encounter: Payer: Self-pay | Admitting: Cardiology

## 2010-04-12 LAB — CBC WITH DIFFERENTIAL/PLATELET
Basophils Absolute: 0 10*3/uL (ref 0.0–0.1)
EOS%: 1.6 % (ref 0.0–7.0)
Eosinophils Absolute: 0.2 10*3/uL (ref 0.0–0.5)
HGB: 12.7 g/dL — ABNORMAL LOW (ref 13.0–17.1)
MONO#: 0.8 10*3/uL (ref 0.1–0.9)
NEUT#: 6.5 10*3/uL (ref 1.5–6.5)
RDW: 15.3 % — ABNORMAL HIGH (ref 11.0–14.6)
WBC: 9.3 10*3/uL (ref 4.0–10.3)
lymph#: 1.8 10*3/uL (ref 0.9–3.3)

## 2010-04-12 LAB — BASIC METABOLIC PANEL
BUN: 23 mg/dL (ref 6–23)
Chloride: 102 mEq/L (ref 96–112)
Glucose, Bld: 127 mg/dL — ABNORMAL HIGH (ref 70–99)
Potassium: 4.5 mEq/L (ref 3.5–5.3)

## 2010-04-16 LAB — CONVERTED CEMR LAB
CO2: 29 meq/L (ref 19–32)
Calcium: 9.4 mg/dL (ref 8.4–10.5)
Chloride: 104 meq/L (ref 96–112)
Creatinine, Ser: 1.2 mg/dL (ref 0.4–1.5)
Glucose, Bld: 146 mg/dL — ABNORMAL HIGH (ref 70–99)
Sodium: 140 meq/L (ref 135–145)

## 2010-04-17 ENCOUNTER — Other Ambulatory Visit: Payer: Self-pay | Admitting: Dermatology

## 2010-04-18 NOTE — Progress Notes (Signed)
Summary: results  Phone Note Call from Patient Call back at Home Phone [phone]   Caller: Patient Call For: [name] Summary of Call: pt states he is waiting to hear back from nurse/ vs re: results of test.  Initial call taken by: Cooper Render, CNA,  August 30, 2009 11:57 AM  Follow-up for Phone Call        pt requesting results of sleep study, it is scanned in under 07-25-09 date. Please advise. [place] Bing CMA  August 30, 2009 12:01 PM   Additional Follow-up for Phone Call Additional follow up Details #1::        d/w pt and wife over the phone. Additional Follow-up by: Chesley Mires MD,  August 30, 2009 6:00 PM

## 2010-04-18 NOTE — Miscellaneous (Signed)
Summary: Orders Update  Clinical Lists Changes  Orders: Added new Test order of Carotid Duplex (Carotid Duplex) - Signed 

## 2010-04-18 NOTE — Letter (Signed)
Summary: Eye Exam/[facility]  Eye Exam/[facility]   Imported By: Laural Benes 01/10/2010 10:53:08  _____________________________________________________________________  External Attachment:    Type:   Image     Comment:   External Document

## 2010-04-18 NOTE — Letter (Signed)
Summary: Appointment - Reschedule  [place], [place]    Phone:   Fax:      Aug 10, 2009 MRN: [id number]   VERLE GELSINGER [address] [place], [place]  [address]   Dear Mr. Calais,   Due to a change in our office schedule, your appointment on  8-22-192011  at   8:30           must be changed.  It is very important that we reach you to reschedule this appointment. We look forward to participating in your health care needs. Please contact us at the number listed above at your earliest convenience to reschedule this appointment.     Sincerely,      [place] Scheduling Team

## 2010-04-18 NOTE — Letter (Signed)
Summary: [name]   Imported By: Marilynne Drivers 02/14/2010 14:17:08  _____________________________________________________________________  External Attachment:    Type:   Image     Comment:   External Document

## 2010-04-18 NOTE — Assessment & Plan Note (Signed)
Summary: 2 MONTHS/ MBW   Visit Type:  Follow-up Copy to:  Dr. Virl Axe Primary Anda Sobotta/Referring Jaretssi Kraker:  Dr. Joni Fears  CC:  OSA.  The patient states he cannot wear cpap for more than 2-3 hours every night. He is having trouble adjusting to the mask.[name]  History of Present Illness: 77 yo male with mild sleep apnea.  He has experienced difficulty adjusting to his mask.  He was using a full face mask, but is now using nasal pillows.  He likes nasal pillows better, but has trouble with mouth breathing.  He was tried on chin strap, but he said this was too hot.    He can fall asleep with his CPAP.  He sleeps for about 1.5 to 2 hours, and then wakes up to use the bathroom.  He will eventually go back to sleep, but can only use the machine for about another 1 hour or 2.  He has not noticed a difference in his sleep quality.  He has problems with nasal congestion, and this makes it difficult for him to use his mask.   Current Medications (verified): 1)  Diazepam 5 Mg Tabs (Diazepam) .... Take 1 Tablet By Mouth Three Times A Day As Needed--Takes Seldom 2)  Digitek 0.125 Mg Tabs (Digoxin) .... Once Daily 3)  Norvasc 5 Mg  Tabs (Amlodipine Besylate) .... Once Daily 4)  Coreg 25 Mg  Tabs (Carvedilol) .... Two Times A Day 5)  Altace 10 Mg  Caps (Ramipril) .... Two Times A Day 6)  Spironolactone 25 Mg  Tabs (Spironolactone) .... Once Daily 7)  Furosemide 40 Mg  Tabs (Furosemide) .... Once Daily 8)  K-Lor 20 Meq  Pack (Potassium Chloride) .... Two Times A Day 9)  Lipitor 40 Mg  Tabs (Atorvastatin Calcium) .... Once Daily 10)  Fish Oil   Oil (Fish Oil) .[name].. 1 Once Daily 11)  Adult Aspirin Ec Low Strength 81 Mg  Tbec (Aspirin) .... Take 1 Tablet By Mouth Once A Day 12)  Glipizide 10 Mg  Tb24 (Glipizide) .... Two Times A Day 13)  Metformin Hcl 1000 Mg Tabs (Metformin Hcl) .... Take 1 By Mouth Two Times A Day For Diabetes 14)  Avandia 4 Mg Tabs (Rosiglitazone Maleate) .[name].. 1 Two Times A Day For  Diabetes 15)  Accu-Chek Comfort Curve   Strp (Glucose Blood) .... Check Blood Sugar Twice Daily 16)  Lexapro 10 Mg  Tabs (Escitalopram Oxalate) .... Once Daily 17)  Mirapex 0.5 Mg Tabs (Pramipexole Dihydrochloride) .[name].. 1 Once Daily For Restless Legs 18)  Flomax 0.4 Mg  Cp24 (Tamsulosin Hcl) .... Once Daily 19)  Diclofenac Sodium 75 Mg Tbec (Diclofenac Sodium) .[name].. 1 By Mouth Two Times A Day As Needed  Allergies (verified): No Known Drug Allergies  Past History:  Past Medical History: Current Problems:  SYSTOLIC HEART FAILURE, CHRONIC (ICD-428.22) CARDIOMYOPATHY, ISCHEMIC (ICD-414.8) HYPERLIPIDEMIA (ICD-272.4) MYOCARDIAL INFARCTION, HX OF (ICD-412) HYPERTENSION (ICD-401.9) CEREBROVASCULAR DISEASE (ICD-437.9) CORONARY ARTERY BYPASS GRAFT, HX OF (ICD-V45.81) RESTLESS LEG SYNDROME (ICD-333.94) IMPLANTATION OF DEFIBRILLATOR,PRIMARY PREVENTION STJUDE (ICD-V45.02) VITAMIN D DEFICIENCY (ICD-268.9) HYPOTHYROIDISM (ICD-244.9) ALLERGIC RHINITIS (ICD-477.9) ANEMIA (ICD-285.9) DIABETES MELLITUS, TYPE II (ICD-250.00) OSA      - AHI 8.4, RDI 13.9  Past Surgical History: Reviewed history from 01/27/2009 and no changes required. 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 SURGEON:  Lilia Argue. Servando Snare, M.D.  Vital Signs:  Patient profile:   77 year old male Height:      73 inches (185.42 cm) Weight:      212 pounds (96.36 kg) BMI:     28.07 O2 Sat:      94 % on Room air Temp:     97.8 degrees F (36.56 degrees C) oral Pulse rate:   70 / minute BP sitting:    116 / 78  (left arm) Cuff size:   large  Vitals Entered By: Francesca Jewett CMA (October 24, 2009 1:31 PM)  O2 Sat at Rest %:  94 O2 Flow:  Room air CC: OSA.  The patient states he cannot wear cpap for more than 2-3 hours every night. He is having trouble adjusting to the mask. Comments Medications reviewed. Daytime phone verified. Francesca Jewett CMA  October 24, 2009 1:32 PM   Physical Exam  General:  normal appearance, healthy appearing, and obese.   Nose:  clear nasal discharge.   Mouth:  MP 3, no exudate Neck:  no JVD.   Lungs:  diminished breath sounds, no wheezing or rales Heart:  regular rhythm and normal rate, 2/6 SM Extremities:  minimal ankle edema Cervical Nodes:  no significant adenopathy   Impression & Recommendations:  Problem # 1:  OBSTRUCTIVE SLEEP APNEA (ICD-327.23)  I had an extensive discussion with him about why we are trying to use CPAP.  I explained that he may not notice a huge change in his sleep quality, but that we are also trying to improve his heart function.  He is willing to try CPAP further for now.  Will get a copy of his CPAP report and have his mask fit assessed at the sleep lab.  Will also try to treat his rhinitis.  Orders: Est. Patient Level III [id number]  Problem # 2:  RHINITIS (ICD-472.0)  Will try him on nasonex to see if this helps improve his tolerance of CPAP.  Orders: Est. Patient Level III [id number]  Medications Added to Medication List This Visit: 1)  Diclofenac Sodium 75 Mg Tbec (Diclofenac sodium) .[name].. 1 by mouth two times a day as needed 2)  Nasonex 50 Mcg/act Susp (Mometasone furoate) .... Two sprays once daily  Complete Medication List: 1)  Diazepam 5 Mg Tabs (Diazepam) .... Take 1 tablet by mouth three times a day as needed--takes seldom 2)  Digitek 0.125 Mg Tabs (Digoxin) .... Once daily 3)  Norvasc 5 Mg Tabs (Amlodipine besylate) .... Once daily 4)  Coreg 25 Mg Tabs (Carvedilol) .... Two times a day 5)  Altace 10 Mg Caps  (Ramipril) .... Two times a day 6)  Spironolactone 25 Mg Tabs (Spironolactone) .... Once daily 7)  Furosemide 40 Mg Tabs (Furosemide) .... Once daily 8)  K-lor 20 Meq Pack (Potassium chloride) .... Two times a day 9)  Lipitor 40 Mg Tabs (Atorvastatin calcium) .... Once daily 10)  Fish Oil Oil (Fish oil) .[name].. 1 once daily 11)  Adult Aspirin Ec Low Strength 81 Mg Tbec (Aspirin) .... Take 1 tablet by mouth once a day 12)  Glipizide 10 Mg Tb24 (Glipizide) .... Two times a day 13)  Metformin Hcl 1000 Mg Tabs (Metformin hcl) .... Take 1 by mouth two times a day for diabetes  14)  Avandia 4 Mg Tabs (Rosiglitazone maleate) .[name].. 1 two times a day for diabetes 15)  Accu-chek Comfort Curve Strp (Glucose blood) .... Check blood sugar twice daily 16)  Lexapro 10 Mg Tabs (Escitalopram oxalate) .... Once daily 17)  Mirapex 0.5 Mg Tabs (Pramipexole dihydrochloride) .[name].. 1 once daily for restless legs 18)  Flomax 0.4 Mg Cp24 (Tamsulosin hcl) .... Once daily 19)  Diclofenac Sodium 75 Mg Tbec (Diclofenac sodium) .[name].. 1 by mouth two times a day as needed 20)  Nasonex 50 Mcg/act Susp (Mometasone furoate) .... Two sprays once daily  Patient Instructions: 1)  Nasonex two sprays once daily  2)  Will get report from CPAP machine 3)  Will arrange for mask fit at sleep lab 4)  Follow up in 4 to 6 weeks Prescriptions: NASONEX 50 MCG/ACT SUSP (MOMETASONE FUROATE) two sprays once daily  #1 x 3   Entered and Authorized by:   Chesley Mires MD   Signed by:   Chesley Mires MD on 10/24/2009   Method used:   Electronically to        [facility] [id number]* (retail)       [place], [place]  [address]       Ph: [phone]       Fax: [phone]   RxID:   [id number]   Appended Document: 2 MONTHS/ MBW    Clinical Lists Changes  Orders: Added new Referral order of Sleep Disorder Referral (Sleep Disorder) - Signed      Appended Document: 2 MONTHS/ MBW reviewed office visit

## 2010-04-18 NOTE — Medication Information (Signed)
Summary: Order for Diabetic Testing Supplies   Order for Diabetic Testing Supplies   Imported By: Laural Benes 01/20/2010 14:06:29  _____________________________________________________________________  External Attachment:    Type:   Image     Comment:   External Document

## 2010-04-18 NOTE — Letter (Signed)
Summary: Flow Cytometry Report  Flow Cytometry Report   Imported By: Marilynne Drivers 01/02/2010 15:37:44  _____________________________________________________________________  External Attachment:    Type:   Image     Comment:   External Document

## 2010-04-18 NOTE — Medication Information (Signed)
Summary: CPAP Supplies/[facility]  CPAP Supplies/[facility]   Imported By: Phillis Knack 09/15/2009 16:11:22  _____________________________________________________________________  External Attachment:    Type:   Image     Comment:   External Document

## 2010-04-18 NOTE — Assessment & Plan Note (Signed)
Summary: eph./f/u on echo & myoview / gd   Referring Provider:  Dr. Virl Axe, Dr. Kirk Ruths Primary Provider:  Dr. Joni Fears   History of Present Illness: [name] is a pleasant gentleman who has a history of coronary artery disease, status post coronary bypassing graft, ischemic cardiomyopathy, CHF, and status post ICD.  His last echocardiogram was performed in Sept 2011.  At that time, the ejection fraction was 25% with mild aortic insufficiency and trivial mitral regurgitation.  His last Myoview in Sept 2011 showed an ejection fraction of 28% with a prior infarct, but there was no ischemia.  His last carotid Dopplers in June 2011 and showed a 60-79% right and 40-59% left carotid stenosis. Followup was recommended in six months. Recently admitted with weakness. Abdominal CT showed mild lymphadenopathy. He also had cold agglutinins that were positive. He was seen by infectious disease and has seen him in followup since discharge. He has also seen hematology oncology for the possibility of lymphoma and is scheduled for followup tomorrow. Since DC he has dyspnea with more moderate activities but not with routine activities. It is relieved with rest. There is no associated chest pain. There is no orthopnea, PND or worsening pedal edema. He has had no syncope or ICD discharges. He feels much improved since discharge.  Current Medications (verified): 1)  Digitek 0.125 Mg Tabs (Digoxin) .... Once Daily 2)  Carvedilol 12.5 Mg Tabs (Carvedilol) .... Take One Tablet By Mouth Twice A Day 3)  Ramipril 2.5 Mg Caps (Ramipril) .... Take One Capsule By Mouth Daily 4)  Spironolactone 25 Mg  Tabs (Spironolactone) .... Once Daily 5)  Furosemide 40 Mg  Tabs (Furosemide) .... Once Daily 6)  Lipitor 40 Mg  Tabs (Atorvastatin Calcium) .... Once Daily 7)  Fish Oil   Oil (Fish Oil) .[name].. 1 Twice A Day 8)  Adult Aspirin Ec Low Strength 81 Mg  Tbec (Aspirin) .... Take 1 Tablet By Mouth Once A Day 9)  Glipizide 10  Mg  Tb24 (Glipizide) .... Two Times A Day 10)  Metformin Hcl 1000 Mg Tabs (Metformin Hcl) .... Take 1 By Mouth Two Times A Day For Diabetes 11)  Accu-Chek Comfort Curve   Strp (Glucose Blood) .... Check Blood Sugar Twice Daily 12)  Lexapro 10 Mg  Tabs (Escitalopram Oxalate) .... Once Daily 13)  Mirapex 0.5 Mg Tabs (Pramipexole Dihydrochloride) .[name].. 1 Once Daily For Restless Legs 14)  Flomax 0.4 Mg  Cp24 (Tamsulosin Hcl) .... Once Daily 15)  Nasonex 50 Mcg/act Susp (Mometasone Furoate) .... Two Sprays Once Daily 16)  Niaspan 1000 Mg Cr-Tabs (Niacin (Antihyperlipidemic)) .[name].. 1 Tab By Mouth Once Daily 17)  Sleep Therapy .... As Directed 18)  Meloxicam 7.5 Mg Tabs (Meloxicam) .[name].. 1 Tab By Mouth Once Daily As Needed Severe Pain With Food 19)  Onglyza 5 Mg Tabs (Saxagliptin Hcl) .[name].. 1 Tab By Mouth Once Daily 20)  Atrovent 0.03 % Soln (Ipratropium Bromide) .... Two Sprays Each Nostril Three Times A Day As Needed  Allergies (verified): No Known Drug Allergies  Past History:  Past Medical History: SYSTOLIC HEART FAILURE, CHRONIC (ICD-428.22) CARDIOMYOPATHY, ISCHEMIC (ICD-414.8) HYPERLIPIDEMIA (ICD-272.4) MYOCARDIAL INFARCTION, HX OF (ICD-412) HYPERTENSION (ICD-401.9) CEREBROVASCULAR DISEASE (ICD-437.9) CORONARY ARTERY BYPASS GRAFT, HX OF (ICD-V45.81) RESTLESS LEG SYNDROME (ICD-333.94) IMPLANTATION OF DEFIBRILLATOR,PRIMARY PREVENTION STJUDE (ICD-V45.02) VITAMIN D DEFICIENCY (ICD-268.9) HYPOTHYROIDISM (ICD-244.9) ALLERGIC RHINITIS (ICD-477.9) ANEMIA (ICD-285.9) DIABETES MELLITUS, TYPE II (ICD-250.00) OSA      - AHI 8.4, RDI 13.9 Cold agglutinins Lymphadenopathy  Past Surgical History:  Reviewed history from 01/27/2009 and no changes required. 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 SURGEON:  Edward B. Servando Snare, M.D.  Social History: Reviewed history from 01/27/2009 and no changes required.  No tobacco or ETOH abuse.  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   Review of Systems       Weight loss but no fevers or chills, productive cough, hemoptysis, dysphasia, odynophagia, melena, hematochezia, dysuria, hematuria, rash, seizure activity, orthopnea, PND, pedal edema, claudication. Remaining systems are negative.   Vital Signs:  Patient profile:   77 year old male Weight:      195 pounds Pulse rate:   68 / minute Pulse rhythm:   regular BP sitting:   138 / 66  (left arm) Cuff size:   regular  Vitals Entered By: Joelyn Oms RN (January 17, 2010 10:53 AM)  Physical Exam  General:  Well-developed well-nourished in no acute distress.  Skin is warm and dry.  HEENT is normal.  Neck is supple. No thyromegaly. Bilateral bruits Chest is clear to auscultation with normal expansion.  Cardiovascular exam is regular rate and rhythm. 2/6 systolic ejection murmur Abdominal exam nontender or distended. No masses palpated. Extremities show trace edema. neuro grossly intact     ICD Specifications Following MD:  Virl Axe, MD     ICD Vendor:  [employer]     ICD Model Number:  [id number]     ICD Serial Number:  [id number] ICD DOI:  03/08/2004     ICD Implanting MD:  Virl Axe, MD  Lead 1:    Location: [facility]     DOI: 03/08/2004     Model #: [id number]     Serial #: [id number]     Status: active  Indications::  ICM   ICD Follow Up ICD Dependent:  No      Episodes Coumadin:  No  Brady Parameters Mode VVI     Lower Rate Limit:  40      Tachy Zones VF:  240     VT:  200     VT1:  160     Impression & Recommendations:  Problem # 1:   ENLARGEMENT OF LYMPH NODES (ICD-785.6) Patient is being managed by infectious disease and oncology for this issue. Followup as scheduled.  Problem # 2:  WEIGHT LOSS (ICD-783.21) Further evaluation per primary care and above mentioned services.  Problem # 3:  SYSTOLIC HEART FAILURE, CHRONIC (ICD-428.22) Euvolemic on examination. Continue present medications. His updated medication list for this problem includes:    Digitek 0.125 Mg Tabs (Digoxin) ..... Once daily    Carvedilol 12.5 Mg Tabs (Carvedilol) .[name]... Take one tablet by mouth twice a day    Ramipril 5 Mg Caps (Ramipril) .[name]... Take one capsule by mouth daily    Spironolactone 25 Mg Tabs (Spironolactone) ..... Once daily    Furosemide 40 Mg Tabs (Furosemide) ..... Once daily    Adult Aspirin Ec Low Strength 81 Mg Tbec (Aspirin) .[name]... Take 1 tablet by mouth once a day  Problem # 4:  CARDIOMYOPATHY, ISCHEMIC (ICD-414.8) Increase  Altace to 5 mg p.o. b.i.d. Check potassium and renal function in one week. His updated medication list for this problem includes:    Digitek 0.125 Mg Tabs (Digoxin) ..... Once daily    Carvedilol 12.5 Mg Tabs (Carvedilol) .[name]... Take one tablet by mouth twice a day    Ramipril 5 Mg Caps (Ramipril) .[name]... Take one capsule by mouth daily    Spironolactone 25 Mg Tabs (Spironolactone) ..... Once daily    Furosemide 40 Mg Tabs (Furosemide) ..... Once daily    Adult Aspirin Ec Low Strength 81 Mg Tbec (Aspirin) .[name]... Take 1 tablet by mouth once a day  Problem # 5:  HYPERLIPIDEMIA (ICD-272.4)  Continue present medications.check lipids and liver. His updated medication list for this problem includes:    Lipitor 40 Mg Tabs (Atorvastatin calcium) ..... Once daily    Niaspan 1000 Mg Cr-tabs (Niacin (antihyperlipidemic)) .[name]... 1 tab by mouth once daily  His updated medication list for this problem includes:    Lipitor 40 Mg Tabs (Atorvastatin calcium) ..... Once daily    Niaspan 1000 Mg Cr-tabs (Niacin  (antihyperlipidemic)) .[name]... 1 tab by mouth once daily  Problem # 6:  HYPERTENSION (ICD-401.9) Blood pressure controlled. His updated medication list for this problem includes:    Carvedilol 12.5 Mg Tabs (Carvedilol) .[name]... Take one tablet by mouth twice a day    Ramipril 5 Mg Caps (Ramipril) .[name]... Take one capsule by mouth daily    Spironolactone 25 Mg Tabs (Spironolactone) ..... Once daily    Furosemide 40 Mg Tabs (Furosemide) ..... Once daily    Adult Aspirin Ec Low Strength 81 Mg Tbec (Aspirin) .[name]... Take 1 tablet by mouth once a day  Problem # 7:  CEREBROVASCULAR DISEASE (ICD-437.9)  Continue aspirin and statin. Followup carotid Dopplers December 2011.  Orders: Carotid Duplex (Carotid Duplex)  Problem # 8:  CORONARY ARTERY BYPASS GRAFT, HX OF (ICD-V45.81) Continue aspirin and statin.  Problem # 9:  IMPLANTATION OF [place] (ICD-V45.02) Management per electrophysiology.  Problem # 10:  DIABETES MELLITUS, TYPE II (ICD-250.00)  The following medications were removed from the medication list:    Onglyza 5 Mg Tabs (Saxagliptin hcl) .[name]... 1 tab by mouth once daily His updated medication list for this problem includes:    Ramipril 5 Mg Caps (Ramipril) .[name]... Take one capsule by mouth daily    Adult Aspirin Ec Low Strength 81 Mg Tbec (Aspirin) .[name]... Take 1 tablet by mouth once a day    Glipizide 10 Mg Tb24 (Glipizide) .[name]..[name] Two times a day    Metformin Hcl 1000 Mg Tabs (Metformin hcl) .[name]... Take 1 by mouth two times a day for diabetes    Onglyza 5 Mg Tabs (Saxagliptin hcl) .[name]... 1 tab by mouth once daily  The following medications were removed from the medication list:    Onglyza 5 Mg Tabs (Saxagliptin hcl) .[name]... 1 tab by mouth once daily His updated medication list for this problem includes:    Ramipril 5 Mg Caps (Ramipril) .[name]... Take one capsule by mouth daily    Adult Aspirin Ec Low Strength 81 Mg Tbec (Aspirin) .[name]... Take 1 tablet by mouth once a  day    Glipizide 10 Mg Tb24 (Glipizide) .[name]..[name] Two times a day    Metformin Hcl 1000 Mg Tabs (Metformin hcl) .[name]... Take 1 by mouth two times a day for diabetes    Onglyza 5 Mg Tabs (Saxagliptin hcl) .[name]... 1 tab by mouth once daily  Problem # 11:  HYPOTHYROIDISM (  ICD-244.9)  Patient Instructions: 1)  Your physician recommends that you schedule a follow-up appointment in: 3 months with Dr. Stanford Breed 2)  Your physician recommends that you return for a FASTING lipid profile, liver, bmet IN 1 WEEK 414.1 3)  Your physician has recommended you make the following change in your medication:  4)  Your physician has requested that you have a carotid duplex. This test is an ultrasound of the carotid arteries in your neck. It looks at blood flow through these arteries that supply the brain with blood. Allow one hour for this exam. There are no restrictions or special instructions.  IN [place] Prescriptions: RAMIPRIL 5 MG CAPS (RAMIPRIL) Take one capsule by mouth daily  #90 x 3   Entered by:   Joelyn Oms RN   Authorized by:   Colin Mulders, MD, [facility]   Signed by:   Joelyn Oms RN on 01/17/2010   Method used:   Faxed to ...       [facility] (mail-order)             , [place]         Ph: [phone]       Fax: [phone]   RxID:   [id number] RAMIPRIL 5 MG CAPS (RAMIPRIL) Take one capsule by mouth daily  #30 x 3   Entered by:   Joelyn Oms RN   Authorized by:   Colin Mulders, MD, [facility]   Signed by:   Joelyn Oms RN on 01/17/2010   Method used:   Electronically to        [facility]. [id number]* (retail)       [place], [place]  [address]       Ph: [phone]       Fax: [phone]   RxID:   [id number]

## 2010-04-18 NOTE — Medication Information (Signed)
Summary: CPAP Supplies/[facility]  CPAP Supplies/[facility]   Imported By: Phillis Knack 09/21/2009 08:53:11  _____________________________________________________________________  External Attachment:    Type:   Image     Comment:   External Document

## 2010-04-18 NOTE — Assessment & Plan Note (Signed)
Summary: 1 month rov/njr   Vital Signs:  Patient profile:   77 year old male Height:      73 inches (185.42 cm) Weight:      194 pounds (88.18 kg) O2 Sat:      97 % on Room air Temp:     97.6 degrees F (36.44 degrees C) oral Pulse rate:   78 / minute BP sitting:   120 / 62  (left arm) Cuff size:   regular  Vitals Entered By: Gardenia Phlegm RMA (January 03, 2010 10:04 AM)  O2 Flow:  Room air CC: 1 month follow up/ CF Is Patient Diabetic? Yes   History of Present Illness: Patient in today for scheduled follow up but also has been hospitalized since he was last seen. His wife had called due to worsening weakness and falls. In the hospital they found an elevated WBC upon initial presentation and it normalized. ID work up was largely unremarkable. An inguinal LN bx was done and the pathology is still pending. The differential was neoplasm vs infection. Has appts with cardiology, ID and Heme/Onc in the next couple of weeks. He feels much stronger since getting home from the hospital. Improved SOB. FSG 115 this am, no numbers less than 80 or higher than 200 in the past week. Happy with Onglyza. Denies CP/palp/SOB/GI or GU c/o today. No f/c/malaise. Reports a good appetite and increased by mouth intake. No n/v/diarrhea/constipation.  Current Medications (verified): 1)  Digitek 0.125 Mg Tabs (Digoxin) .... Once Daily 2)  Coreg 25 Mg  Tabs (Carvedilol) .... Two Times A Day 3)  Altace 10 Mg  Caps (Ramipril) .... Two Times A Day 4)  Spironolactone 25 Mg  Tabs (Spironolactone) .... Once Daily 5)  Furosemide 40 Mg  Tabs (Furosemide) .... Once Daily 6)  Lipitor 40 Mg  Tabs (Atorvastatin Calcium) .... Once Daily 7)  Fish Oil   Oil (Fish Oil) .[name].. 1 Once Daily 8)  Adult Aspirin Ec Low Strength 81 Mg  Tbec (Aspirin) .... Take 1 Tablet By Mouth Once A Day 9)  Glipizide 10 Mg  Tb24 (Glipizide) .... Two Times A Day 10)  Metformin Hcl 1000 Mg Tabs (Metformin Hcl) .... Take 1 By Mouth Two Times A Day For  Diabetes 11)  Accu-Chek Comfort Curve   Strp (Glucose Blood) .... Check Blood Sugar Twice Daily 12)  Lexapro 10 Mg  Tabs (Escitalopram Oxalate) .... Once Daily 13)  Mirapex 0.5 Mg Tabs (Pramipexole Dihydrochloride) .[name].. 1 Once Daily For Restless Legs 14)  Flomax 0.4 Mg  Cp24 (Tamsulosin Hcl) .... Once Daily 15)  Nasonex 50 Mcg/act Susp (Mometasone Furoate) .... Two Sprays Once Daily 16)  Niaspan 1000 Mg Cr-Tabs (Niacin (Antihyperlipidemic)) .[name].. 1 Tab By Mouth Once Daily 17)  Sleep Therapy .... As Directed 18)  Meloxicam 7.5 Mg Tabs (Meloxicam) .[name].. 1 Tab By Mouth Once Daily As Needed Severe Pain With Food 19)  Onglyza 5 Mg Tabs (Saxagliptin Hcl) .[name].. 1 Tab By Mouth Once Daily 20)  Atrovent 0.03 % Soln (Ipratropium Bromide) .... Two Sprays Each Nostril Three Times A Day As Needed  Allergies (verified): No Known Drug Allergies  Past History:  Past medical history reviewed for relevance to current acute and chronic problems. Social history (including risk factors) reviewed for relevance to current acute and chronic problems.  Past Medical History: Reviewed history from 11/07/2009 and no changes required. SYSTOLIC HEART FAILURE, CHRONIC (ICD-428.22) CARDIOMYOPATHY, ISCHEMIC (ICD-414.8) HYPERLIPIDEMIA (ICD-272.4) MYOCARDIAL INFARCTION, HX OF (ICD-412) HYPERTENSION (ICD-401.9) CEREBROVASCULAR DISEASE (  ICD-437.9) CORONARY ARTERY BYPASS GRAFT, HX OF (ICD-V45.81) RESTLESS LEG SYNDROME (ICD-333.94) IMPLANTATION OF DEFIBRILLATOR,PRIMARY PREVENTION STJUDE (ICD-V45.02) VITAMIN D DEFICIENCY (ICD-268.9) HYPOTHYROIDISM (ICD-244.9) ALLERGIC RHINITIS (ICD-477.9) ANEMIA (ICD-285.9) DIABETES MELLITUS, TYPE II (ICD-250.00) OSA      - AHI 8.4, RDI 13.9  Social History: Reviewed history from 01/27/2009 and no changes required.  No tobacco or ETOH abuse.  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   Review of Systems      See HPI  Physical Exam  General:   Well-developed,well-nourished,in no acute distress; alert,appropriate and cooperative throughout examination Head:  Normocephalic and atraumatic without obvious abnormalities. No apparent alopecia or balding. Lungs:  Normal respiratory effort, chest expands symmetrically. Lungs are clear to auscultation, no crackles or wheezes. Heart:  grade  2/6 systolic murmur.  normal rate, regular rhythm Abdomen:  Bowel sounds positive,abdomen soft and non-tender without masses, organomegaly or hernias noted. Extremities:  No clubbing, cyanosis, edema, or deformity noted with normal full range of motion of all joints.   Psych:  Cognition and judgment appear intact. Alert and cooperative with normal attention span and concentration. No apparent delusions, illusions, hallucinations   Impression & Recommendations:  Problem # 1:  SYSTOLIC HEART FAILURE, CHRONIC (ICD-428.22)  His updated medication list for this problem includes:    Digitek 0.125 Mg Tabs (Digoxin) ..... Once daily    Coreg 25 Mg Tabs (Carvedilol) .[name]..[name] Two times a day    Altace 10 Mg Caps (Ramipril) .[name]..[name] Two times a day    Spironolactone 25 Mg Tabs (Spironolactone) ..... Once daily    Furosemide 40 Mg Tabs (Furosemide) ..... Once daily    Adult Aspirin Ec Low Strength 81 Mg Tbec (Aspirin) .[name]... Take 1 tablet by mouth once a day Brings in daily weight log, has had a slow steady weight loss by watching fluids and maintaining his diuretics  Problem # 2:  UNSPECIFIED ANEMIA (ICD-285.9)  Orders: TLB-CBC Platelet - w/Differential (85025-CBCD) Specimen Handling (99000) Venipuncture [id number]) Also low platelets  and labile WBCs after reviewing his hospital records, he has an appt with Hematology next month and is encouraged to keep this, will repeat CBC with next month's visit.  Problem # 3:  OBSTRUCTIVE SLEEP APNEA (ICD-327.23) Has been compliant with his CPAP and is awaiting an O2 study at home to evaluate for oxygen needs at home.  Problem  # 4:  CEREBROVASCULAR DISEASE (ICD-437.9) Patient denies any history of CVA or TIA  Problem # 5:  DIABETES MELLITUS, TYPE II (ICD-250.00)  His updated medication list for this problem includes:    Altace 10 Mg Caps (Ramipril) .[name]..[name] Two times a day    Adult Aspirin Ec Low Strength 81 Mg Tbec (Aspirin) .[name]... Take 1 tablet by mouth once a day    Glipizide 10 Mg Tb24 (Glipizide) .[name]..[name] Two times a day    Metformin Hcl 1000 Mg Tabs (Metformin hcl) .[name]... Take 1 by mouth two times a day for diabetes    Onglyza 5 Mg Tabs (Saxagliptin hcl) .[name]... 1 tab by mouth once daily Improved control with Onglyza, given samples and an rx  Problem # 6:  WEIGHT LOSS (ICD-783.21) Encouraged increase protein and by mouth intake and reassess weight at next visit.  Complete Medication List: 1)  Digitek 0.125 Mg Tabs (Digoxin) .... Once daily 2)  Coreg 25 Mg Tabs (Carvedilol) .... Two times a day 3)  Altace 10 Mg Caps (Ramipril) .... Two times a day 4)  Spironolactone 25 Mg Tabs (  Spironolactone) .... Once daily 5)  Furosemide 40 Mg Tabs (Furosemide) .... Once daily 6)  Lipitor 40 Mg Tabs (Atorvastatin calcium) .... Once daily 7)  Fish Oil Oil (Fish oil) .[name].. 1 once daily 8)  Adult Aspirin Ec Low Strength 81 Mg Tbec (Aspirin) .... Take 1 tablet by mouth once a day 9)  Glipizide 10 Mg Tb24 (Glipizide) .... Two times a day 10)  Metformin Hcl 1000 Mg Tabs (Metformin hcl) .... Take 1 by mouth two times a day for diabetes 11)  Accu-chek Comfort Curve Strp (Glucose blood) .... Check blood sugar twice daily 12)  Lexapro 10 Mg Tabs (Escitalopram oxalate) .... Once daily 13)  Mirapex 0.5 Mg Tabs (Pramipexole dihydrochloride) .[name].. 1 once daily for restless legs 14)  Flomax 0.4 Mg Cp24 (Tamsulosin hcl) .... Once daily 15)  Nasonex 50 Mcg/act Susp (Mometasone furoate) .... Two sprays once daily 16)  Niaspan 1000 Mg Cr-tabs (Niacin (antihyperlipidemic)) .[name].. 1 tab by mouth once daily 17)  Sleep Therapy  .... As directed 18)   Meloxicam 7.5 Mg Tabs (Meloxicam) .[name].. 1 tab by mouth once daily as needed severe pain with food 19)  Onglyza 5 Mg Tabs (Saxagliptin hcl) .[name].. 1 tab by mouth once daily 20)  Atrovent 0.03 % Soln (Ipratropium bromide) .... Two sprays each nostril three times a day as needed 21)  Clobetasol Propionate 0.05 % Oint (Clobetasol propionate) .... Apply small amount to affected area 22)  Onglyza 5 Mg Tabs (Saxagliptin hcl) .[name].. 1 tab by mouth once daily  Other Orders: TLB-BMP (Basic Metabolic Panel-BMET) (99991111)  Patient Instructions: 1)  Please schedule a follow-up appointment in 1 month.  2)  BMP prior to visit, ICD-9: at next, 401.1 3)  CBC w/ Diff prior to visit ICD-9 : 401.1 4)  Continue daily weights and fluid restrictions and report any concerning numbers. Prescriptions: ONGLYZA 5 MG TABS (SAXAGLIPTIN HCL) 1 tab by mouth once daily  #90 x 1   Entered and Authorized by:   Penni Homans MD   Signed by:   Gardenia Phlegm RMA on 01/03/2010   Method used:   Faxed to ...       [facility] (mail-order)             , [place]         Ph: [phone]       Fax: [phone]   RxID:   [id number] CLOBETASOL PROPIONATE 0.05 % OINT (CLOBETASOL PROPIONATE) apply small amount to affected area  #1 tube x 3   Entered and Authorized by:   Penni Homans MD   Signed by:   Gardenia Phlegm RMA on 01/03/2010   Method used:   Electronically to        [id number] (retail)       [place], [place]  [address]       Ph: [phone]       Fax: [phone]   RxID:   [id number]    Orders Added: 1)  TLB-CBC Platelet - w/Differential [85025-CBCD] 2)  TLB-BMP (Basic Metabolic Panel-BMET) [id number] 3)  Specimen Handling [99000] 4)  Venipuncture [36415] 5)  Est. Patient Level IV [id number]

## 2010-04-18 NOTE — Cardiovascular Report (Signed)
Summary: Office Visit   Office Visit   Imported By: Sallee Provencal 07/22/2009 14:16:46  _____________________________________________________________________  External Attachment:    Type:   Image     Comment:   External Document

## 2010-04-18 NOTE — Consult Note (Signed)
[facility]   Imported By: Bonner Puna 01/30/2010 15:11:51  _____________________________________________________________________  External Attachment:    Type:   Image     Comment:   External Document

## 2010-04-18 NOTE — Assessment & Plan Note (Signed)
Summary: hsfu need chart/fever adenopathy on ct   Vital Signs:  Patient profile:   77 year old male Height:      73 inches (185.42 cm) Weight:      194.25 pounds (88.30 kg) BMI:     25.72 Temp:     98.2 degrees F (36.78 degrees C) oral Pulse rate:   82 / minute BP sitting:   124 / 73  (left arm)  Vitals Entered By: Jarrett Ables CMA (January 04, 2010 10:18 AM) CC: hsfu  Is Patient Diabetic? Yes Did you bring your meter with you today? No Pain Assessment Patient in pain? no      Nutritional Status BMI of 25 - 29 = overweight Nutritional Status Detail nl  Does patient need assistance? Functional Status Self care Ambulation Normal   Referring Provider:  Dr. Virl Axe, Dr. Kirk Ruths Primary Provider:  Dr. Joni Fears  CC:  hsfu .  History of Present Illness: 77 year old with cardiomyopathy, CHF who was admitted to [facility] last month after syncope, low grade fevers, leukocytosis with presence of cold agglutinins and also found to have retroperitoneal lymphadenopthy and inguinal LA thought LN were borderline enlarged. He had iniital workup in the hospitla which included negative blood cultures, negative mycoplasma abs, negative EBV and CMV panels for acute infection, negative quantiferon gold, histoplasma ag in urine. We tried to get excisional LN biopsy for path and culture but surgery and Radiology felt LN too small to make this worthwhile. HE is scheduled to see Dr. Jamse Arn with Hematology on November 2nd. He is without fevers. He had nearly left the hospital ama and had been angered at being put in isolation for possible pulmonary tb and then c diff precautions. Otherwise he feels well. Dr. Jamse Arn  Problems Prior to Update: 1)  Weight Loss  (ICD-783.21) 2)  Unspecified Anemia  (ICD-285.9) 3)  Rhinitis  (ICD-472.0) 4)  Uns Advrs Eff Oth Rx Medicinal&biological Sbstnc  [id number]) 5)  Obstructive Sleep Apnea  (ICD-327.23) 6)  Arthritis, Hip  (0000000) 7)  Systolic  Heart Failure, Chronic  (ICD-428.22) 8)  Cardiomyopathy, Ischemic  (ICD-414.8) 9)  Hyperlipidemia  (ICD-272.4) 10)  Hypertension  (ICD-401.9) 11)  Cerebrovascular Disease  (ICD-437.9) 12)  Coronary Artery Bypass Graft, Hx of  (ICD-V45.81) 13)  Nocturia  (ICD-788.43) 14)  Restless Leg Syndrome  (ICD-333.94) 15)  Implantation of Defibrillator,primary Prevention Stjude  (ICD-V45.02) 16)  Vitamin D Deficiency  (ICD-268.9) 17)  Hypothyroidism  (ICD-244.9) 18)  Special Screening Malignant Neoplasm of Prostate  (ICD-V76.44) 19)  Diabetes Mellitus, Type II  (ICD-250.00)  Medications Prior to Update: 1)  Digitek 0.125 Mg Tabs (Digoxin) .... Once Daily 2)  Coreg 25 Mg  Tabs (Carvedilol) .... Two Times A Day 3)  Altace 10 Mg  Caps (Ramipril) .... Two Times A Day 4)  Spironolactone 25 Mg  Tabs (Spironolactone) .... Once Daily 5)  Furosemide 40 Mg  Tabs (Furosemide) .... Once Daily 6)  Lipitor 40 Mg  Tabs (Atorvastatin Calcium) .... Once Daily 7)  Fish Oil   Oil (Fish Oil) .[name].. 1 Once Daily 8)  Adult Aspirin Ec Low Strength 81 Mg  Tbec (Aspirin) .... Take 1 Tablet By Mouth Once A Day 9)  Glipizide 10 Mg  Tb24 (Glipizide) .... Two Times A Day 10)  Metformin Hcl 1000 Mg Tabs (Metformin Hcl) .... Take 1 By Mouth Two Times A Day For Diabetes 11)  Accu-Chek Comfort Curve   Strp (Glucose Blood) .... Check Blood Sugar Twice Daily 12)  Lexapro 10 Mg  Tabs (Escitalopram Oxalate) .... Once Daily 13)  Mirapex 0.5 Mg Tabs (Pramipexole Dihydrochloride) .[name].. 1 Once Daily For Restless Legs 14)  Flomax 0.4 Mg  Cp24 (Tamsulosin Hcl) .... Once Daily 15)  Nasonex 50 Mcg/act Susp (Mometasone Furoate) .... Two Sprays Once Daily 16)  Niaspan 1000 Mg Cr-Tabs (Niacin (Antihyperlipidemic)) .[name].. 1 Tab By Mouth Once Daily 17)  Sleep Therapy .... As Directed 18)  Meloxicam 7.5 Mg Tabs (Meloxicam) .[name].. 1 Tab By Mouth Once Daily As Needed Severe Pain With Food 19)  Onglyza 5 Mg Tabs (Saxagliptin Hcl) .[name].. 1 Tab By Mouth Once  Daily 20)  Atrovent 0.03 % Soln (Ipratropium Bromide) .... Two Sprays Each Nostril Three Times A Day As Needed 21)  Clobetasol Propionate 0.05 % Oint (Clobetasol Propionate) .... Apply Small Amount To Affected Area 22)  Onglyza 5 Mg Tabs (Saxagliptin Hcl) .[name].. 1 Tab By Mouth Once Daily  Current Medications (verified): 1)  Digitek 0.125 Mg Tabs (Digoxin) .... Once Daily 2)  Coreg 25 Mg  Tabs (Carvedilol) .... Two Times A Day 3)  Altace 10 Mg  Caps (Ramipril) .... Two Times A Day 4)  Spironolactone 25 Mg  Tabs (Spironolactone) .... Once Daily 5)  Furosemide 40 Mg  Tabs (Furosemide) .... Once Daily 6)  Lipitor 40 Mg  Tabs (Atorvastatin Calcium) .... Once Daily 7)  Fish Oil   Oil (Fish Oil) .[name].. 1 Once Daily 8)  Adult Aspirin Ec Low Strength 81 Mg  Tbec (Aspirin) .... Take 1 Tablet By Mouth Once A Day 9)  Glipizide 10 Mg  Tb24 (Glipizide) .... Two Times A Day 10)  Metformin Hcl 1000 Mg Tabs (Metformin Hcl) .... Take 1 By Mouth Two Times A Day For Diabetes 11)  Accu-Chek Comfort Curve   Strp (Glucose Blood) .... Check Blood Sugar Twice Daily 12)  Lexapro 10 Mg  Tabs (Escitalopram Oxalate) .... Once Daily 13)  Mirapex 0.5 Mg Tabs (Pramipexole Dihydrochloride) .[name].. 1 Once Daily For Restless Legs 14)  Flomax 0.4 Mg  Cp24 (Tamsulosin Hcl) .... Once Daily 15)  Nasonex 50 Mcg/act Susp (Mometasone Furoate) .... Two Sprays Once Daily 16)  Niaspan 1000 Mg Cr-Tabs (Niacin (Antihyperlipidemic)) .[name].. 1 Tab By Mouth Once Daily 17)  Sleep Therapy .... As Directed 18)  Meloxicam 7.5 Mg Tabs (Meloxicam) .[name].. 1 Tab By Mouth Once Daily As Needed Severe Pain With Food 19)  Onglyza 5 Mg Tabs (Saxagliptin Hcl) .[name].. 1 Tab By Mouth Once Daily 20)  Atrovent 0.03 % Soln (Ipratropium Bromide) .... Two Sprays Each Nostril Three Times A Day As Needed 21)  Clobetasol Propionate 0.05 % Oint (Clobetasol Propionate) .... Apply Small Amount To Affected Area 22)  Onglyza 5 Mg Tabs (Saxagliptin Hcl) .[name].. 1 Tab By Mouth Once  Daily  Allergies (verified): No Known Drug Allergies  Past History:  Past Medical History: SYSTOLIC HEART FAILURE, CHRONIC (ICD-428.22) CARDIOMYOPATHY, ISCHEMIC (ICD-414.8) HYPERLIPIDEMIA (ICD-272.4) MYOCARDIAL INFARCTION, HX OF (ICD-412) HYPERTENSION (ICD-401.9) CEREBROVASCULAR DISEASE (ICD-437.9) CORONARY ARTERY BYPASS GRAFT, HX OF (ICD-V45.81) RESTLESS LEG SYNDROME (ICD-333.94) IMPLANTATION OF DEFIBRILLATOR,PRIMARY PREVENTION STJUDE (ICD-V45.02) VITAMIN D DEFICIENCY (ICD-268.9) HYPOTHYROIDISM (ICD-244.9) ALLERGIC RHINITIS (ICD-477.9) ANEMIA (ICD-285.9) DIABETES MELLITUS, TYPE II (ICD-250.00) OSA      - AHI 8.4, RDI 13.9  Cold agglutinins Lymphadenopathy  Past Surgical History: Reviewed history from 01/27/2009 and no changes required. 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 SURGEON:  Lilia Argue. Servando Snare, M.D.  Physical Exam  General:  Well-developed,well-nourished,in no acute distress; alert,appropriate and cooperative throughout examination Head:  Normocephalic and atraumatic without obvious abnormalities.  Eyes:  PERRLA and EOMI.   Ears:  no external deformities.   Nose:  no external erythema and no nasal discharge.   Mouth:  pharynx pink and moist, no erythema, and no exudates.   Neck:  supple and full ROM.   Lungs:  normal respiratory effort, no crackles, and no wheezes.   Heart:  normal rate, regular rhythm, no murmur, no gallop, and no rub.   Abdomen:  soft, non-tender, normal bowel sounds, and no distention.   Msk:  normal ROM  and no joint deformities.   Extremities:  1+ left pedal edema and 1+ right pedal edema.   Neurologic:  alert & oriented X3, strength normal in all extremities, and gait normal.   Skin:  no rashes.   Inguinal Nodes:  has bilateral inguinal lymphadenopathy Psych:  Oriented X3, memory intact for recent and remote, and dysphoric affect.     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 I will recheck mycoplasma antibodies, ebv panel histo plasma ag blood, cocci, blasto abs from blood. He needs to see Dr. Jamse Arn re this. LYmphoma remains in the differential  Orders: Est. Patient Level IV [id number])  Problem # 2:  ENLARGEMENT OF LYMPH NODES (ICD-785.6) see above orders. I would at minimum repeat ct scan in another 2 months, but perhaps excisonal LN biopsy should be pursued before then. LAdenopathy with positive cold agglutinins and no obvious infectoius cause does raise possiblity of lymphoma. He is seeing Dr. Jamse Arn on November 2nd Orders: T-Epstein-Barr virus, early antigen [phone]) T-Epstein-Barr virus, nuclear antigen [phone]) T-Epstein-Barr virus, viral capsid [phone]) T- * Misc. Laboratory test [phone]) T- * Misc. Laboratory test [id number]) T- * Misc. Laboratory test [phone]) T- * Misc. Laboratory test [phone]) T- * Misc. Laboratory test [phone]) Est. Patient Level IV [id number])  Patient Instructions: 1)  rtc to see Dr. Tommy Medal in December   Orders Added: 1)  T-Epstein-Barr virus, early antigen [phone] 2)  T-Epstein-Barr virus, nuclear antigen [phone] 3)  T-Epstein-Barr virus, viral capsid [phone] 4)  T- * Misc. Laboratory test [99999] 5)  T- * Misc. Laboratory test [99999] 6)  T- * Misc. Laboratory test [99999] 7)  T- * Misc. Laboratory test [id number])  T- * Misc. Laboratory test [id number] 9)  Est. Patient Level IV [id number]

## 2010-04-18 NOTE — Medication Information (Signed)
Summary: Order for Diabetic Testing Supplies  Order for Diabetic Testing Supplies   Imported By: Laural Benes 02/02/2010 11:21:18  _____________________________________________________________________  External Attachment:    Type:   Image     Comment:   External Document

## 2010-04-18 NOTE — Progress Notes (Signed)
Summary: fyi  Phone Note Other Incoming   Caller: [facility]laura home health nurse [phone] Summary of Call: Pt is going well (CHF) laura will decrease frequency to one a week for service. Mickel Baas is planning to discharge pt week of 02-13-2010 Initial call taken by: Glo Herring,  January 30, 2010 3:28 PM

## 2010-04-18 NOTE — Letter (Signed)
Summary: [facility]   Imported By: Rise Patience 01/30/2010 12:10:41  _____________________________________________________________________  External Attachment:    Type:   Image     Comment:   External Document

## 2010-04-18 NOTE — Medication Information (Signed)
Summary: Order for Diabetic Testing Supplies  Order for Diabetic Testing Supplies   Imported By: Laural Benes 12/30/2009 11:19:50  _____________________________________________________________________  External Attachment:    Type:   Image     Comment:   External Document

## 2010-04-18 NOTE — Assessment & Plan Note (Signed)
Summary: consult re: hip pain/cjr   Vital Signs:  Patient profile:   77 year old male Weight:      212 pounds Temp:     98.6 degrees F oral BP sitting:   120 / 62  Vitals Entered By: Deanna Artis CMA (April 20, 2009 4:21 PM) CC: right hip pain Is Patient Diabetic? Yes Pain Assessment Patient in pain? no        History of Present Illness: this 77 year old white male diabetic is in today complaining of pain in her right hip over the past week pain has increased in severity has been non-responsive to anti-inflammatory medication, he has been taken either Profen with poor results. He also relates he has had some low back pain episodically, no known injury or cause for the onset of pain in the hip and back this discomfort has occurred previously but not to this severity CBCs were good 80-110 Hypertension is stable Cardiac symptoms under control no symptoms at this time Over the past week he has at times felt as if he had a low grade fever some generalized aching his congestion and slight cough nonproductive but muscle aches  Current Medications (verified): 1)  Diazepam 5 Mg Tabs (Diazepam) .... Take 1 Tablet By Mouth Three Times A Day As Needed--Takes Seldom 2)  Digitek 0.125 Mg Tabs (Digoxin) .... Once Daily 3)  Glipizide 10 Mg  Tb24 (Glipizide) .... Two Times A Day 4)  Accu-Chek Comfort Curve   Strp (Glucose Blood) .... Check Blood Sugar Twice Daily 5)  Norvasc 5 Mg  Tabs (Amlodipine Besylate) .... Once Daily 6)  Coreg 25 Mg  Tabs (Carvedilol) .... Two Times A Day 7)  Altace 10 Mg  Caps (Ramipril) .... Two Times A Day 8)  Furosemide 40 Mg  Tabs (Furosemide) .... Once Daily 9)  Niaspan 1000 Mg  Tbcr (Niacin (Antihyperlipidemic)) .... Once Daily 10)  Lexapro 10 Mg  Tabs (Escitalopram Oxalate) .... Once Daily 11)  Chewable Vitamin C 500 Mg  Chew (Ascorbic Acid) .... Once Daily 12)  Adult Aspirin Ec Low Strength 81 Mg  Tbec (Aspirin) .... Take 1 Tablet By Mouth Once A Day 13)   K-Lor 20 Meq  Pack (Potassium Chloride) .... Two Times A Day 14)  Lipitor 40 Mg  Tabs (Atorvastatin Calcium) .... Once Daily 15)  Flomax 0.4 Mg  Cp24 (Tamsulosin Hcl) .... Once Daily 16)  Spironolactone 25 Mg  Tabs (Spironolactone) .... Once Daily 17)  Flonase 50 Mcg/act  Susp (Fluticasone Propionate) .[name].. 1 Spray Each Nostril Once Daily 18)  Metformin Hcl 1000 Mg Tabs (Metformin Hcl) .... Take 1 By Mouth Two Times A Day For Diabetes 19)  Mirapex 0.5 Mg Tabs (Pramipexole Dihydrochloride) .[name].. 1 Once Daily For Restless Legs 20)  Avandia 4 Mg Tabs (Rosiglitazone Maleate) .[name].. 1 Two Times A Day For Diabetes 21)  Fish Oil   Oil (Fish Oil) .[name].. 1 Once Daily  Allergies (verified): No Known Drug Allergies  Past History:  Past Medical History: Last updated: 01/31/2009 Current Problems:  SYSTOLIC HEART FAILURE, CHRONIC (ICD-428.22) CARDIOMYOPATHY, ISCHEMIC (ICD-414.8) HYPERLIPIDEMIA (ICD-272.4) MYOCARDIAL INFARCTION, HX OF (ICD-412) HYPERTENSION (ICD-401.9) CEREBROVASCULAR DISEASE (ICD-437.9) CORONARY ARTERY BYPASS GRAFT, HX OF (ICD-V45.81) RESTLESS LEG SYNDROME (ICD-333.94) IMPLANTATION OF DEFIBRILLATOR,PRIMARY PREVENTION STJUDE (ICD-V45.02) VITAMIN D DEFICIENCY (ICD-268.9) HYPOTHYROIDISM (ICD-244.9) ALLERGIC RHINITIS (ICD-477.9) ANEMIA (ICD-285.9) DIABETES MELLITUS, TYPE II (ICD-250.00)  Past Surgical History: Last updated: 01/27/2009 Cataract extraction Cholecystectomy s/p  ICD -defibralllator s/p resection  left ventriculr apical aneursym   PROCEDURE:  Coronary  artery bypass grafting x4 with left internal mammary to the diagonal coronary artery, reverse saphenous vein graft to the LAD, reverse saphenous vein graft to the circumflex, reverse saphenous vein graft to the distal right coronary artery, and 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 SURGEON:  Lilia Argue. Servando Snare, M.D.  Social History: Last updated:  01/27/2009  No tobacco or ETOH abuse.  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   Risk Factors: Smoking Status: quit (09/25/2006)  Review of Systems  The patient denies anorexia, fever, weight loss, weight gain, vision loss, decreased hearing, hoarseness, chest pain, syncope, dyspnea on exertion, peripheral edema, prolonged cough, headaches, hemoptysis, abdominal pain, melena, hematochezia, severe indigestion/heartburn, hematuria, incontinence, genital sores, muscle weakness, suspicious skin lesions, transient blindness, difficulty walking, depression, unusual weight change, abnormal bleeding, enlarged lymph nodes, angioedema, breast masses, and testicular masses.    Physical Exam  General:  Well-developed,well-nourished,in no acute distress; alert,appropriate and cooperative throughout examination Head:  Normocephalic and atraumatic without obvious abnormalities. No apparent alopecia or balding. Eyes:  No corneal or conjunctival inflammation noted. EOMI. Perrla. Funduscopic exam benign, without hemorrhages, exudates or papilledema. Vision grossly normal. Ears:  External ear exam shows no significant lesions or deformities.  Otoscopic examination reveals clear canals, tympanic membranes are intact bilaterally without bulging, retraction, inflammation or discharge. Hearing is grossly normal bilaterally. Nose:  minimally for congestion Mouth:  pharynx pink and moist.   Lungs:  Normal respiratory effort, chest expands symmetrically. Lungs are clear to auscultation, no crackles or wheezes. Heart:  Normal rate and regular rhythm. S1 and S2 normal without gallop, murmur, click, rub or other extra sounds. Abdomen:  Bowel sounds positive,abdomen soft and non-tender without masses, organomegaly or hernias noted. Msk:  marked tenderness over the right hip joint some pain on movement Pulses:  R and L carotid,radial,femoral,dorsalis pedis and posterior tibial pulses are full and equal  bilaterally Extremities:  No clubbing, cyanosis, edema, or deformity noted with normal full range of motion of all joints.     Impression & Recommendations:  Problem # 1:  FLU SYNDROME (ICD-487.1) Assessment New  Tamiflu  Orders: Prescription Created Electronically [phone])  Problem # 2:  ARTHRITIS, HIP (ICD-716.95) Assessment: New  Orders: T-Hip Comp Right Min 2 views ([id number]) Prescription Created Electronically [phone])  Problem # 3:  MYOCARDIAL INFARCTION, HX OF (ICD-412) Assessment: Improved  His updated medication list for this problem includes:    Norvasc 5 Mg Tabs (Amlodipine besylate) ..... Once daily    Coreg 25 Mg Tabs (Carvedilol) .[name]..[name] Two times a day    Altace 10 Mg Caps (Ramipril) .[name]..[name] Two times a day    Furosemide 40 Mg Tabs (Furosemide) ..... Once daily    Adult Aspirin Ec Low Strength 81 Mg Tbec (Aspirin) .[name]... Take 1 tablet by mouth once a day    Spironolactone 25 Mg Tabs (Spironolactone) ..... Once daily  Problem # 4:  HYPERTENSION (ICD-401.9) Assessment: Improved  His updated medication list for this problem includes:    Norvasc 5 Mg Tabs (Amlodipine besylate) ..... Once daily    Coreg 25 Mg Tabs (Carvedilol) .[name]..[name] Two times a day    Altace 10 Mg Caps (Ramipril) .[name]..[name] Two times a day    Furosemide 40 Mg Tabs (Furosemide) ..... Once daily    Spironolactone 25 Mg Tabs (Spironolactone) ..... Once daily  Problem # 5:  CORONARY ARTERY BYPASS GRAFT, HX OF (ICD-V45.81) Assessment: Improved  His updated  medication list for this problem includes:    Norvasc 5 Mg Tabs (Amlodipine besylate) ..... Once daily    Coreg 25 Mg Tabs (Carvedilol) .[name]..[name] Two times a day    Altace 10 Mg Caps (Ramipril) .[name]..[name] Two times a day    Furosemide 40 Mg Tabs (Furosemide) ..... Once daily    Adult Aspirin Ec Low Strength 81 Mg Tbec (Aspirin) .[name]... Take 1 tablet by mouth once a day    Spironolactone 25 Mg Tabs (Spironolactone) ..... Once daily  Problem # 6:  DIABETES MELLITUS,  TYPE II (ICD-250.00) Assessment: Improved  His updated medication list for this problem includes:    Glipizide 10 Mg Tb24 (Glipizide) .[name]..[name] Two times a day    Altace 10 Mg Caps (Ramipril) .[name]..[name] Two times a day    Adult Aspirin Ec Low Strength 81 Mg Tbec (Aspirin) .[name]... Take 1 tablet by mouth once a day    Metformin Hcl 1000 Mg Tabs (Metformin hcl) .[name]... Take 1 by mouth two times a day for diabetes    Avandia 4 Mg Tabs (Rosiglitazone maleate) .[name]... 1 two times a day for diabetes  Complete Medication List: 1)  Diazepam 5 Mg Tabs (Diazepam) .... Take 1 tablet by mouth three times a day as needed--takes seldom 2)  Digitek 0.125 Mg Tabs (Digoxin) .... Once daily 3)  Glipizide 10 Mg Tb24 (Glipizide) .... Two times a day 4)  Accu-chek Comfort Curve Strp (Glucose blood) .... Check blood sugar twice daily 5)  Norvasc 5 Mg Tabs (Amlodipine besylate) .... Once daily 6)  Coreg 25 Mg Tabs (Carvedilol) .... Two times a day 7)  Altace 10 Mg Caps (Ramipril) .... Two times a day 8)  Furosemide 40 Mg Tabs (Furosemide) .... Once daily 9)  Niaspan 1000 Mg Tbcr (Niacin (antihyperlipidemic)) .... Once daily 10)  Lexapro 10 Mg Tabs (Escitalopram oxalate) .... Once daily 11)  Chewable Vitamin C 500 Mg Chew (Ascorbic acid) .... Once daily 12)  Adult Aspirin Ec Low Strength 81 Mg Tbec (Aspirin) .... Take 1 tablet by mouth once a day 13)  K-lor 20 Meq Pack (Potassium chloride) .... Two times a day 14)  Lipitor 40 Mg Tabs (Atorvastatin calcium) .... Once daily 15)  Flomax 0.4 Mg Cp24 (Tamsulosin hcl) .... Once daily 16)  Spironolactone 25 Mg Tabs (Spironolactone) .... Once daily 17)  Flonase 50 Mcg/act Susp (Fluticasone propionate) .[name].. 1 spray each nostril once daily 18)  Metformin Hcl 1000 Mg Tabs (Metformin hcl) .... Take 1 by mouth two times a day for diabetes 19)  Mirapex 0.5 Mg Tabs (Pramipexole dihydrochloride) .[name].. 1 once daily for restless legs 20)  Avandia 4 Mg Tabs (Rosiglitazone maleate) .[name].. 1 two  times a day for diabetes 21)  Fish Oil Oil (Fish oil) .[name].. 1 once daily 22)  Tamiflu 75 Mg Caps (Oseltamivir phosphate) .[name].. 1 two times a day to prevnt or control flu 23)  Diclofenac Sodium 75 Mg Tbec (Diclofenac sodium) .[name].. 1 two times a day pc for arthritis  Patient Instructions: 1)  I feel she had a flulike syndrome and would be beneficial to take Tamiflu 75 mg twice daily for 5 days 2)  We will get an x-ray of the right hip without pink the diagnoses is arthritis number treat you with diclofenac 75 mg twice daily. Will call results of x-ray as soon as well\par 3)  Continue other medications Prescriptions: DICLOFENAC SODIUM 75 MG TBEC (DICLOFENAC SODIUM) 1 two times a day pc for arthritis  #60 x 11  Entered and Authorized by:   Emeterio Reeve MD   Signed by:   Emeterio Reeve MD on 04/20/2009   Method used:   Electronically to        [facility]. [id number]* (retail)       [place], [place]  [address]       Ph: [phone]       Fax: [phone]   RxID:   [id number] TAMIFLU 75 MG CAPS (OSELTAMIVIR PHOSPHATE) 1 two times a day to prevnt or control flu  #10 x 1   Entered and Authorized by:   Emeterio Reeve MD   Signed by:   Emeterio Reeve MD on 04/20/2009   Method used:   Electronically to        [facility]. [id number]* (retail)       [place], [place]  [address]       Ph: [phone]       Fax: [phone]   RxID:   [id number]

## 2010-04-18 NOTE — Miscellaneous (Signed)
Summary: Orders Update  Clinical Lists Changes  Problems: Added new problem of CORONARY ATHEROSCLEROSIS NATIVE CORONARY ARTERY (ICD-414.01) Added new problem of OTHER SPEC FORMS CHRONIC ISCHEMIC HEART DISEASE (ICD-414.8) Orders: Added new Test order of TLB-Lipid Panel (80061-LIPID) - Signed Added new Test order of TLB-BMP (Basic Metabolic Panel-BMET) (99991111) - Signed Added new Test order of TLB-Hepatic/Liver Function Pnl (80076-HEPATIC) - Signed

## 2010-04-18 NOTE — Cardiovascular Report (Signed)
Summary: Office Visit Remote   Office Visit Remote   Imported By: Sallee Provencal 04/25/2009 16:44:11  _____________________________________________________________________  External Attachment:    Type:   Image     Comment:   External Document

## 2010-04-18 NOTE — Progress Notes (Signed)
Summary: daibetic supplies  Phone Note From Pharmacy Call back at [phone] on phonepad   Caller: tina,[facility] billing office [phone] - vm Summary of Call: Sending physician's order diabetic order.  Form we received from your office 10-25 had one time & unable to make out rest, however patient did pick up supplies to test 2x.  Updating form to with correct amount.   Questions, give me a call  Initial call taken by: Shelbie Hutching, RN,  January 16, 2010 12:50 PM  Follow-up for Phone Call        OK just forward new form when it arrives Follow-up by: Penni Homans MD,  January 16, 2010 2:01 PM

## 2010-04-18 NOTE — Assessment & Plan Note (Signed)
Summary: 1 mo rov/mm LMOM FOR PT TO RSC/NJR   Vital Signs:  Patient profile:   77 year old male Height:      73 inches (185.42 cm) Weight:      192 pounds (87.27 kg) O2 Sat:      97 % on Room air Temp:     97.8 degrees F (36.56 degrees C) oral Pulse rate:   71 / minute BP sitting:   130 / 68  (left arm) Cuff size:   regular  Vitals Entered By: Gardenia Phlegm RMA (February 03, 2010 2:26 PM)  O2 Flow:  Room air CC: 1 month follow up/ CF Is Patient Diabetic? Yes   History of Present Illness: patient is 36 Caucasian male in today for evaluation of multiple medical is feeling significantly better at this time. he is to date with his wife who confirms that he is doing better. His appetite is improved he's not having any pain. He has been seen by hematology oncology as well as infectious disease and so far no cause for his lymphadenopathy anemia leukocytosis haven't found that he is feeling better status post treatment they do not believe at this time he has a lymphoma and the patient himself agrees that his energy level is better his appetite is better and he is on the mend. Heart his blood sugars morning was 135 but has generally been running between 90 and 100 denies polyuria polydipsia. Denies chest pain, palpitations, shortness of breath, GI or GU concerns at this time. He reports his weight continues to drop but much more slowly.  Current Medications (verified): 1)  Digitek 0.125 Mg Tabs (Digoxin) .... Once Daily 2)  Carvedilol 12.5 Mg Tabs (Carvedilol) .... Take One Tablet By Mouth Twice A Day 3)  Ramipril 5 Mg Caps (Ramipril) .... Take One Capsule By Mouth Daily 4)  Spironolactone 25 Mg  Tabs (Spironolactone) .... Once Daily 5)  Furosemide 40 Mg  Tabs (Furosemide) .... Once Daily 6)  Lipitor 40 Mg  Tabs (Atorvastatin Calcium) .... Once Daily 7)  Fish Oil   Oil (Fish Oil) .[name].. 1 Twice A Day 8)  Adult Aspirin Ec Low Strength 81 Mg  Tbec (Aspirin) .... Take 1 Tablet By Mouth Once A  Day 9)  Glipizide 10 Mg  Tb24 (Glipizide) .... Two Times A Day 10)  Metformin Hcl 1000 Mg Tabs (Metformin Hcl) .... Take 1 By Mouth Two Times A Day For Diabetes 11)  Accu-Chek Comfort Curve   Strp (Glucose Blood) .... Check Blood Sugar Twice Daily 12)  Lexapro 10 Mg  Tabs (Escitalopram Oxalate) .... Once Daily 13)  Mirapex 0.5 Mg Tabs (Pramipexole Dihydrochloride) .[name].. 1 Once Daily For Restless Legs 14)  Flomax 0.4 Mg  Cp24 (Tamsulosin Hcl) .... Once Daily 15)  Nasonex 50 Mcg/act Susp (Mometasone Furoate) .... Two Sprays Once Daily 16)  Niaspan 1000 Mg Cr-Tabs (Niacin (Antihyperlipidemic)) .[name].. 1 Tab By Mouth Once Daily 17)  Sleep Therapy .... As Directed 18)  Meloxicam 7.5 Mg Tabs (Meloxicam) .[name].. 1 Tab By Mouth Once Daily As Needed Severe Pain With Food 19)  Onglyza 5 Mg Tabs (Saxagliptin Hcl) .[name].. 1 Tab By Mouth Once Daily 20)  Atrovent 0.03 % Soln (Ipratropium Bromide) .... Two Sprays Each Nostril Three Times A Day As Needed  Allergies (verified): No Known Drug Allergies  Past History:  Past medical history reviewed for relevance to current acute and chronic problems. Social history (including risk factors) reviewed for relevance to current acute and chronic problems.  Past Medical History: Reviewed history from 01/17/2010 and no changes required. SYSTOLIC HEART FAILURE, CHRONIC (ICD-428.22) CARDIOMYOPATHY, ISCHEMIC (ICD-414.8) HYPERLIPIDEMIA (ICD-272.4) MYOCARDIAL INFARCTION, HX OF (ICD-412) HYPERTENSION (ICD-401.9) CEREBROVASCULAR DISEASE (ICD-437.9) CORONARY ARTERY BYPASS GRAFT, HX OF (ICD-V45.81) RESTLESS LEG SYNDROME (ICD-333.94) IMPLANTATION OF DEFIBRILLATOR,PRIMARY PREVENTION STJUDE (ICD-V45.02) VITAMIN D DEFICIENCY (ICD-268.9) HYPOTHYROIDISM (ICD-244.9) ALLERGIC RHINITIS (ICD-477.9) ANEMIA (ICD-285.9) DIABETES MELLITUS, TYPE II (ICD-250.00) OSA      - AHI 8.4, RDI 13.9 Cold agglutinins Lymphadenopathy  Social History: Reviewed history from 01/27/2009 and no  changes required.  No tobacco or ETOH abuse.  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   Review of Systems      See HPI  Physical Exam  General:  Well-developed,well-nourished,in no acute distress; alert,appropriate and cooperative throughout examination Head:  Normocephalic and atraumatic without obvious abnormalities. No apparent alopecia or balding. Mouth:  Oral mucosa and oropharynx without lesions or exudates.  Teeth in good repair. Neck:  No deformities, masses, or tenderness noted. Lungs:  Normal respiratory effort, chest expands symmetrically. Lungs are clear to auscultation, no crackles or wheezes. Heart:  Normal rate and regular rhythm. S1 and S2 normal without gallop, click, rub or other extra sounds.Grade  1 /6 systolic ejection murmur.   Abdomen:  Bowel sounds positive,abdomen soft and non-tender without masses, organomegaly or hernias noted. Extremities:  No clubbing, cyanosis, edema, or deformity noted with normal full range of motion of all joints.   Skin:  scaly lesion on left cheek enlarging, flesh colored, 5 mm x12 mm roughly Cervical Nodes:  No lymphadenopathy noted Psych:  Cognition and judgment appear intact. Alert and cooperative with normal attention span and concentration. No apparent delusions, illusions, hallucinations   Impression & Recommendations:  Problem # 1:  LESION, FACE (ICD-238.2)  Orders: Dermatology Referral (Derma)  Problem # 2:  CORONARY ATHEROSCLEROSIS NATIVE CORONARY ARTERY (ICD-414.01)  His updated medication list for this problem includes:    Carvedilol 12.5 Mg Tabs (Carvedilol) .[name]... Take one tablet by mouth twice a day    Ramipril 5 Mg Caps (Ramipril) .[name]... Take one capsule by mouth daily    Spironolactone 25 Mg Tabs (Spironolactone) ..... Once daily    Furosemide 40 Mg Tabs (Furosemide) ..... Once daily    Adult Aspirin Ec Low Strength 81 Mg Tbec (Aspirin) .[name]... Take 1 tablet by mouth once a day  Stable following with  cardiology  Problem # 3:  WEIGHT LOSS (ICD-783.21) Slowed considerably, appears to have been some unclear acute infection, no changes to regimen at this time call if appetite or weight drops again for further eval  Problem # 4:  ENLARGEMENT OF LYMPH NODES (ICD-785.6) Is following with ID and Heme/Onc and will cont to do so for f/u CT scan  Problem # 5:  HYPERTENSION (ICD-401.9)  His updated medication list for this problem includes:    Carvedilol 12.5 Mg Tabs (Carvedilol) .[name]... Take one tablet by mouth twice a day    Ramipril 5 Mg Caps (Ramipril) .[name]... Take one capsule by mouth daily    Spironolactone 25 Mg Tabs (Spironolactone) ..... Once daily    Furosemide 40 Mg Tabs (Furosemide) ..... Once daily Well controlled  Complete Medication List: 1)  Digitek 0.125 Mg Tabs (Digoxin) .... Once daily 2)  Carvedilol 12.5 Mg Tabs (Carvedilol) .... Take one tablet by mouth twice a day 3)  Ramipril 5 Mg Caps (Ramipril) .... Take one capsule by mouth daily 4)  Spironolactone 25 Mg Tabs (Spironolactone) .... Once daily 5)  Furosemide 40 Mg Tabs (Furosemide) .... Once daily 6)  Lipitor 40 Mg Tabs (Atorvastatin calcium) .... Once daily 7)  Fish Oil Oil (Fish oil) .[name].. 1 twice a day 8)  Adult Aspirin Ec Low Strength 81 Mg Tbec (Aspirin) .... Take 1 tablet by mouth once a day 9)  Glipizide 10 Mg Tb24 (Glipizide) .... Two times a day 10)  Metformin Hcl 1000 Mg Tabs (Metformin hcl) .... Take 1 by mouth two times a day for diabetes 11)  Accu-chek Comfort Curve Strp (Glucose blood) .... Check blood sugar twice daily 12)  Lexapro 10 Mg Tabs (Escitalopram oxalate) .... Once daily 13)  Mirapex 0.5 Mg Tabs (Pramipexole dihydrochloride) .[name].. 1 once daily for restless legs 14)  Flomax 0.4 Mg Cp24 (Tamsulosin hcl) .... Once daily 15)  Nasonex 50 Mcg/act Susp (Mometasone furoate) .... Two sprays once daily 16)  Niaspan 1000 Mg Cr-tabs (Niacin (antihyperlipidemic)) .[name].. 1 tab by mouth once daily 17)  Sleep  Therapy  .... As directed 18)  Meloxicam 7.5 Mg Tabs (Meloxicam) .[name].. 1 tab by mouth once daily as needed severe pain with food 19)  Onglyza 5 Mg Tabs (Saxagliptin hcl) .[name].. 1 tab by mouth once daily 20)  Atrovent 0.03 % Soln (Ipratropium bromide) .... Two sprays each nostril three times a day as needed  Patient Instructions: 1)  BMP prior to visit, ICD-9: 401.9 2)  Digoxin level: 414.01 3)  HgBA1c prior to visit  ICD-9: 250.0 4)  Urine Microalbumin prior to visit ICD-9 : 250.0 5)  Please schedule a follow-up appointment in 2 months with Dr Joni Fears Prescriptions: MELOXICAM 7.5 MG TABS (MELOXICAM) 1 tab by mouth once daily as needed severe pain with food  #30 x 2   Entered and Authorized by:   Penni Homans MD   Signed by:   Penni Homans MD on 02/03/2010   Method used:   Electronically to        [facility]. [id number]* (retail)       [place], [place]  [address]       Ph: [phone]       Fax: [phone]   RxID:   [id number]    Orders Added: 1)  Dermatology Referral [Derma] 2)  Est. Patient Level IV [id number]    Preventive Care Screening  Last Flu Shot:    Date:  12/17/2009    Results:  historical

## 2010-04-18 NOTE — Progress Notes (Signed)
Summary: homebound status   Phone Note From Other Clinic   Caller: [place] [phone] Request: Talk with Nurse Details of Complaint: Has question regarding homebound status. notes was faxed on 11-2. Initial call taken by: Neil Crouch,  February 01, 2010 11:16 AM  Follow-up for Phone Call        left message for callback.  Follow-up by: Joan Flores RN,  February 01, 2010 12:24 PM  Additional Follow-up for Phone Call Additional follow up Details #1::        spoke with [facility], questions answered Fredia Beets, RN  February 02, 2010 2:16 PM

## 2010-04-18 NOTE — Progress Notes (Signed)
Summary: FYI  Phone Note Call from Patient   Summary of Call: Patients wife states husband is still very weak (in legs) and every step he is going closer to the ground. Pts spouse stated she had to call the fire dept two days ago to help get patient off the floor. Pts spouse states there is no SOB or chest pain. Per MD I instructed Pts spouse he needed to go ahead to the ER. Initial call taken by: Gardenia Phlegm RMA,  December 16, 2009 9:27 AM  Follow-up for Phone Call        Agree Follow-up by: Penni Homans MD,  December 16, 2009 10:13 AM

## 2010-04-18 NOTE — Letter (Signed)
Summary: [name] - Face-to-Face Encounter  [place] Encounter   Imported By: Marilynne Drivers 02/07/2010 14:53:48  _____________________________________________________________________  External Attachment:    Type:   Image     Comment:   External Document

## 2010-04-18 NOTE — Progress Notes (Signed)
Summary: Nuclear Pre-Procedure  Phone Note Outgoing Call   Call placed by: Perrin Maltese, EMT-P,  November 28, 2009 4:01 PM Summary of Call: Left message with information on Myoview Information Sheet (see scanned document for details).      Nuclear Med Background Indications for Stress Test: Evaluation for Ischemia, Graft Patency, Post Hospital  Indications Comments: 11/21/09 CHF R/O MI  History: CABG, Defibrillator, Myocardial Infarction, Myocardial Perfusion Study  History Comments: 06/05 MI--CABG 12/05 Defibrilator 03/09 MPS EF 25% ICM  Symptoms: DOE    Nuclear Pre-Procedure Cardiac Risk Factors: Carotid Disease, Hypertension, LBBB, Lipids, NIDDM Height (in): 18  Nuclear Med Study Referring MD:  B.Crenshaw

## 2010-04-18 NOTE — Miscellaneous (Signed)
Summary: CPAP Compliance/[facility]  CPAP Compliance/[facility]   Imported By: Phillis Knack 10/03/2009 11:21:13  _____________________________________________________________________  External Attachment:    Type:   Image     Comment:   External Document

## 2010-04-18 NOTE — Assessment & Plan Note (Signed)
Summary: defib [email or web address]   CC:  defib check.  History of Present Illness:  [name] is seen in followup for an ICD implanted in the setting of ischemic heart disease prior bypass surgery and chronic systolic failure.    His last echocardiogram was performed in October 2009.  At that time, the ejection fraction was 20-25% with mild- to-moderate aortic insufficiency and mild mitral regurgitation.   His last Myoview in March of 2009 showed an ejection fraction of 25% with a prior anterior, apical, and inferoapical infarct, but there was no ischemia.     The patient denies SOB, chest pain,  or palpitations; he has had problems with intermittent peripheral edema However he does note significant daytime somnolence sleep disordered breathing.   Current Medications (verified): 1)  Diazepam 5 Mg Tabs (Diazepam) .... Take 1 Tablet By Mouth Three Times A Day As Needed--Takes Seldom 2)  Digitek 0.125 Mg Tabs (Digoxin) .... Once Daily 3)  Glipizide 10 Mg  Tb24 (Glipizide) .... Two Times A Day 4)  Accu-Chek Comfort Curve   Strp (Glucose Blood) .... Check Blood Sugar Twice Daily 5)  Norvasc 5 Mg  Tabs (Amlodipine Besylate) .... Once Daily 6)  Coreg 25 Mg  Tabs (Carvedilol) .... Two Times A Day 7)  Altace 10 Mg  Caps (Ramipril) .... Two Times A Day 8)  Furosemide 40 Mg  Tabs (Furosemide) .... Once Daily 9)  Niaspan 1000 Mg  Tbcr (Niacin (Antihyperlipidemic)) .... Once Daily 10)  Lexapro 10 Mg  Tabs (Escitalopram Oxalate) .... Once Daily 11)  Chewable Vitamin C 500 Mg  Chew (Ascorbic Acid) .... Once Daily 12)  Adult Aspirin Ec Low Strength 81 Mg  Tbec (Aspirin) .... Take 1 Tablet By Mouth Once A Day 13)  K-Lor 20 Meq  Pack (Potassium Chloride) .... Two Times A Day 14)  Lipitor 40 Mg  Tabs (Atorvastatin Calcium) .... Once Daily 15)  Flomax 0.4 Mg  Cp24 (Tamsulosin Hcl) .... Once Daily 16)  Spironolactone 25 Mg  Tabs (Spironolactone) .... Once Daily 17)  Flonase 50 Mcg/act  Susp (Fluticasone  Propionate) .[name].. 1 Spray Each Nostril Once Daily 18)  Metformin Hcl 1000 Mg Tabs (Metformin Hcl) .... Take 1 By Mouth Two Times A Day For Diabetes 19)  Mirapex 0.5 Mg Tabs (Pramipexole Dihydrochloride) .[name].. 1 Once Daily For Restless Legs 20)  Avandia 4 Mg Tabs (Rosiglitazone Maleate) .[name].. 1 Two Times A Day For Diabetes 21)  Fish Oil   Oil (Fish Oil) .[name].. 1 Once Daily  Allergies (verified): No Known Drug Allergies  Past History:  Past Medical History: Last updated: 01/31/2009 Current Problems:  SYSTOLIC HEART FAILURE, CHRONIC (ICD-428.22) CARDIOMYOPATHY, ISCHEMIC (ICD-414.8) HYPERLIPIDEMIA (ICD-272.4) MYOCARDIAL INFARCTION, HX OF (ICD-412) HYPERTENSION (ICD-401.9) CEREBROVASCULAR DISEASE (ICD-437.9) CORONARY ARTERY BYPASS GRAFT, HX OF (ICD-V45.81) RESTLESS LEG SYNDROME (ICD-333.94) IMPLANTATION OF DEFIBRILLATOR,PRIMARY PREVENTION STJUDE (ICD-V45.02) VITAMIN D DEFICIENCY (ICD-268.9) HYPOTHYROIDISM (ICD-244.9) ALLERGIC RHINITIS (ICD-477.9) ANEMIA (ICD-285.9) DIABETES MELLITUS, TYPE II (ICD-250.00)  Past Surgical History: Last updated: 01/27/2009 Cataract extraction Cholecystectomy s/p  ICD -defibralllator s/p resection  left ventriculr apical aneursym   PROCEDURE:  Coronary artery bypass grafting x4 with left internal mammary to the diagonal coronary artery, reverse saphenous vein graft to the LAD, reverse saphenous vein graft to the circumflex, reverse saphenous vein graft to the distal right coronary artery, and 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 SURGEON:  Lilia Argue. Servando Snare, M.D.  Family History: Last updated: 01/27/2009  Reviewed and noncontributory  Social  History: Last updated: 01/27/2009  No tobacco or ETOH abuse.  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   Vital Signs:  Patient profile:   76 year old male Height:      73 inches Weight:      225 pounds BMI:     29.79 Pulse rate:    62 / minute Pulse rhythm:   regular BP sitting:   126 / 64  (left arm) Cuff size:   regular  Vitals Entered By: Doug Sou CMA (Jul 19, 2009 10:28 AM)  Physical Exam  General:  The patient was alert and oriented in no acute distress. HEENT Normal.hearing aids in place  Neck veins were flat, carotids were brisk.  Lungs were clear.  Heart sounds were regular witha 2/6 murmur heard on the right upper sternal border or gallops.  Abdomen was soft with active bowel sounds. There is no clubbing cyanosis 2+ edema Skin Warm and dry     ICD Specifications Following MD:  Virl Axe, MD     ICD Vendor:  [employer]     ICD Model Number:  [phone]     ICD Serial Number:  [id number] ICD DOI:  03/08/2004     ICD Implanting MD:  Virl Axe, MD  Lead 1:    Location: RV     DOI: 03/08/2004     Model #: [id number]     Serial #: [id number]     Status: active  Indications::  ICM   ICD Follow Up Remote Check?  No Battery Voltage:  2.66 V     Charge Time:  11.4 seconds     ICD Dependent:  No       ICD Device Measurements Right Ventricle:  Amplitude: 12 mV, Impedance: 435 ohms, Threshold: 0.75 V at 0.5 msec  Episodes Coumadin:  No Shock:  0     ATP:  0     Nonsustained:  0     Ventricular Pacing:  <1%  Brady Parameters Mode VVI     Lower Rate Limit:  40      Tachy Zones VF:  240     VT:  200     VT1:  160     Next Remote Date:  10/20/2009     Next Cardiology Appt Due:  07/18/2010 Tech Comments:  No parameter changes.  Device function normal.  Atlas battery 2.66V today.  Merlin transmissions every 3 months.  ROV 1 year with Dr. Caryl Comes. Alma Friendly, LPN  May  3, [id number] AM   Impression & Recommendations:  Problem # 1:  SLEEP APNEA (ICD-780.57)  The patient has significant symptoms consistent with obstructive sleep apnea including daytime somnolence hypertension edema poor rest. We'll undertake a2. day night watch study  Orders: Misc. Referral (Misc. Ref)  Problem # 2:  SYSTOLIC HEART FAILURE,  CHRONIC (ICD-428.22)  His heart failure is relatively well compensated. I suspect however that he does be negatively impacted by sleep apnea hypertension. His edema may be related to his amlodipine. For now we'll continue him on his current medications pendts of the above His updated medication list for this problem includes:    Digitek 0.125 Mg Tabs (Digoxin) ..... Once daily    Norvasc 5 Mg Tabs (Amlodipine besylate) ..... Once daily    Coreg 25 Mg Tabs (Carvedilol) .[name]..[name] Two times a day    Altace 10 Mg Caps (Ramipril) .[name]..[name] Two times a day    Furosemide 40 Mg  Tabs (Furosemide) ..... Once daily    Adult Aspirin Ec Low Strength 81 Mg Tbec (Aspirin) .[name]... Take 1 tablet by mouth once a day    Spironolactone 25 Mg Tabs (Spironolactone) ..... Once daily  His updated medication list for this problem includes:    Digitek 0.125 Mg Tabs (Digoxin) ..... Once daily    Norvasc 5 Mg Tabs (Amlodipine besylate) ..... Once daily    Coreg 25 Mg Tabs (Carvedilol) .[name]..[name] Two times a day    Altace 10 Mg Caps (Ramipril) .[name]..[name] Two times a day    Furosemide 40 Mg Tabs (Furosemide) ..... Once daily    Adult Aspirin Ec Low Strength 81 Mg Tbec (Aspirin) .[name]... Take 1 tablet by mouth once a day    Spironolactone 25 Mg Tabs (Spironolactone) ..... Once daily  Problem # 3:  CARDIOMYOPATHY, ISCHEMIC (ICD-414.8)  stable at this point without evidence of recurrent angina  His updated medication list for this problem includes:    Digitek 0.125 Mg Tabs (Digoxin) ..... Once daily    Norvasc 5 Mg Tabs (Amlodipine besylate) ..... Once daily    Coreg 25 Mg Tabs (Carvedilol) .[name]..[name] Two times a day    Altace 10 Mg Caps (Ramipril) .[name]..[name] Two times a day    Furosemide 40 Mg Tabs (Furosemide) ..... Once daily    Adult Aspirin Ec Low Strength 81 Mg Tbec (Aspirin) .[name]... Take 1 tablet by mouth once a day    Spironolactone 25 Mg Tabs (Spironolactone) ..... Once daily  Problem # 4:  IMPLANTATION OF DEFIBRILLATOR,PRIMARY PREVENTION  STJUDE (ICD-V45.02) Device parameters and data were reviewed and no changes were made  Patient Instructions: 1)  Your physician recommends that you schedule a follow-up appointment in: 12 months  2)  pt needs a night watch monitor

## 2010-04-18 NOTE — Cardiovascular Report (Signed)
Summary: Office Visit Remote   Office Visit Remote   Imported By: Sallee Provencal 01/30/2010 15:12:36  _____________________________________________________________________  External Attachment:    Type:   Image     Comment:   External Document

## 2010-04-18 NOTE — Assessment & Plan Note (Signed)
Summary: sleep apena/cb   Visit Type:  Initial Consult Copy to:  Dr. Virl Axe Primary Provider/Referring Provider:  Dr. Joni Fears  CC:  Sleep consult for snoring. Epworth score is 5..  History of Present Illness: 77 yo male for sleep evaluation.  He is followed by cardiology.  He was noted to have snoring, and had a home sleep test.  This showed sleep apnea, and he was referred for further evaluation.  He is not aware of having any problems with his sleep.  He does snore.  His wife sleeps in a separate room, and can't comment more about his sleep patterns.  He gets a dry mouth.  He is not aware of breathing problems while asleep.  He goes to bed at 1030 pm.  He uses a valium about once per week.  He wakes up 2 or 3 times to use the bathroom.  He gets out of bed at 7am.  He feels okay in the morning, and denies headaches.  He takes a nap in the afternoon.  He drinks one cup of coffee in the morning.  He denies sleep walking, sleep talking, or bruxism.  He gets crazy dreams about his experiences during the war.  He is on mirapex for restless leg syndrome.  There is no history of sleep hallucinations, sleep paralysis, or cataplexy.  There is no history of thyroid disease.  He has gained about 15 lbs recently.   Preventive Screening-Counseling & Management  Alcohol-Tobacco     Smoking Status: current smokeless tobacco use     Cans of tobacco/week: ?  Current Medications (verified): 1)  Diazepam 5 Mg Tabs (Diazepam) .... Take 1 Tablet By Mouth Three Times A Day As Needed--Takes Seldom 2)  Digitek 0.125 Mg Tabs (Digoxin) .... Once Daily 3)  Glipizide 10 Mg  Tb24 (Glipizide) .... Two Times A Day 4)  Accu-Chek Comfort Curve   Strp (Glucose Blood) .... Check Blood Sugar Twice Daily 5)  Norvasc 5 Mg  Tabs (Amlodipine Besylate) .... Once Daily 6)  Coreg 25 Mg  Tabs (Carvedilol) .... Two Times A Day 7)  Altace 10 Mg  Caps (Ramipril) .... Two Times A Day 8)  Furosemide 40 Mg  Tabs (Furosemide)  .... Once Daily 9)  Niaspan 1000 Mg  Tbcr (Niacin (Antihyperlipidemic)) .... Once Daily 10)  Lexapro 10 Mg  Tabs (Escitalopram Oxalate) .... Once Daily 11)  Adult Aspirin Ec Low Strength 81 Mg  Tbec (Aspirin) .... Take 1 Tablet By Mouth Once A Day 12)  K-Lor 20 Meq  Pack (Potassium Chloride) .... Two Times A Day 13)  Lipitor 40 Mg  Tabs (Atorvastatin Calcium) .... Once Daily 14)  Flomax 0.4 Mg  Cp24 (Tamsulosin Hcl) .... Once Daily 15)  Spironolactone 25 Mg  Tabs (Spironolactone) .... Once Daily 16)  Metformin Hcl 1000 Mg Tabs (Metformin Hcl) .... Take 1 By Mouth Two Times A Day For Diabetes 17)  Mirapex 0.5 Mg Tabs (Pramipexole Dihydrochloride) .[name].. 1 Once Daily For Restless Legs 18)  Avandia 4 Mg Tabs (Rosiglitazone Maleate) .[name].. 1 Two Times A Day For Diabetes 19)  Fish Oil   Oil (Fish Oil) .[name].. 1 Once Daily 20)  Diclofenac Sodium 75 Mg Tbec (Diclofenac Sodium) .[name].. 1 By Mouth Two Times A Day 21)  Vitamin D (Ergocalciferol) 50000 Unit Caps (Ergocalciferol) .[name].. 1 Weekly X 12  Allergies (verified): No Known Drug Allergies  Past History:  Past Medical History: Current Problems:  SYSTOLIC HEART FAILURE, CHRONIC (ICD-428.22) CARDIOMYOPATHY, ISCHEMIC (ICD-414.8) HYPERLIPIDEMIA (ICD-272.4)  MYOCARDIAL INFARCTION, HX OF (ICD-412) HYPERTENSION (ICD-401.9) CEREBROVASCULAR DISEASE (ICD-437.9) CORONARY ARTERY BYPASS GRAFT, HX OF (ICD-V45.81) RESTLESS LEG SYNDROME (ICD-333.94) IMPLANTATION OF DEFIBRILLATOR,PRIMARY PREVENTION STJUDE (ICD-V45.02) VITAMIN D DEFICIENCY (ICD-268.9) HYPOTHYROIDISM (ICD-244.9) ALLERGIC RHINITIS (ICD-477.9) ANEMIA (ICD-285.9) DIABETES MELLITUS, TYPE II (ICD-250.00) OSA  Past Surgical History: Reviewed history from 01/27/2009 and no changes required. 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 SURGEON:  Edward B. Servando Snare, M.D.  Family History: Reviewed history from 01/27/2009 and no changes required.  Reviewed and noncontributory  Social History: Reviewed history from 01/27/2009 and no changes required.  No tobacco or ETOH abuse.  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 Smoking Status:  current smokeless tobacco use Cans of tobacco/week:  ?  Vital Signs:  Patient profile:   77 year old male Height:      73 inches (185.42 cm) Weight:      219 pounds (99.55 kg) BMI:     29.00 O2 Sat:      96 % on Room air Temp:     98.1 degrees F (36.72 degrees C) oral Pulse rate:   64 / minute BP sitting:   128 / 74  (right arm) Cuff size:   large  Vitals Entered By: Francesca Jewett CMA (August 22, 2009 2:32 PM)  O2 Sat at Rest %:  96 O2 Flow:  Room air CC: Sleep consult for snoring. Epworth score is 5. Is Patient Diabetic? Yes Comments Medications reviewed. Daytime phone verified. Francesca Jewett CMA  August 22, 2009 2:33 PM   Physical Exam  General:  normal appearance, healthy appearing, and obese.   Eyes:  PERRLA and EOMI.   Nose:  clear nasal discharge.   Mouth:  MP 3, no exudate Neck:  no JVD.   Chest Wall:  AICD Lt upper chest Lungs:  diminished breath sounds, no wheezing or rales Heart:  regular rhythm and normal rate, 2/6 SM Abdomen:  soft, non-tender, normal bowel sounds Pulses:  pulses normal Extremities:  minimal ankle edema Neurologic:  normal CN II-XII, gait normal, and strength normal.   Cervical Nodes:  no significant adenopathy Psych:  alert and cooperative; normal mood and affect; normal attention span and concentration   Impression & Recommendations:  Problem # 1:  OBSTRUCTIVE  SLEEP APNEA (ICD-327.23)  He has a recent home sleep test which showed evidence for sleep apnea.  He has a history of cardiovascular disease and diabetes.  I explained to him how sleep apnea can affect his health.  The importance of weight reduction was reviewed.  Driving precautions were discussed.  I reviewed various options to treat his sleep apnea.  Unfortunately, I do not have his sleep test results at this time.  I have contacted Dr. Olin Pia office, and they will fax the results as soon as possible.  I will call him to discuss the results, and review what the next appropriate option will be.  Complete Medication List: 1)  Diazepam 5 Mg Tabs (Diazepam) .... Take 1 tablet by mouth three times a day as needed--takes seldom 2)  Digitek 0.125 Mg Tabs (Digoxin) .Marland KitchenMarland KitchenMarland Kitchen  Once daily 3)  Norvasc 5 Mg Tabs (Amlodipine besylate) .... Once daily 4)  Coreg 25 Mg Tabs (Carvedilol) .... Two times a day 5)  Altace 10 Mg Caps (Ramipril) .... Two times a day 6)  Spironolactone 25 Mg Tabs (Spironolactone) .... Once daily 7)  Furosemide 40 Mg Tabs (Furosemide) .... Once daily 8)  K-lor 20 Meq Pack (Potassium chloride) .... Two times a day 9)  Niaspan 1000 Mg Tbcr (Niacin (antihyperlipidemic)) .... Once daily 10)  Lipitor 40 Mg Tabs (Atorvastatin calcium) .... Once daily 11)  Fish Oil Oil (Fish oil) .[name].. 1 once daily 12)  Adult Aspirin Ec Low Strength 81 Mg Tbec (Aspirin) .... Take 1 tablet by mouth once a day 13)  Glipizide 10 Mg Tb24 (Glipizide) .... Two times a day 14)  Metformin Hcl 1000 Mg Tabs (Metformin hcl) .... Take 1 by mouth two times a day for diabetes 15)  Avandia 4 Mg Tabs (Rosiglitazone maleate) .[name].. 1 two times a day for diabetes 16)  Accu-chek Comfort Curve Strp (Glucose blood) .... Check blood sugar twice daily 17)  Lexapro 10 Mg Tabs (Escitalopram oxalate) .... Once daily 18)  Mirapex 0.5 Mg Tabs (Pramipexole dihydrochloride) .[name].. 1 once daily for restless legs 19)  Flomax 0.4 Mg Cp24  (Tamsulosin hcl) .... Once daily 20)  Diclofenac Sodium 75 Mg Tbec (Diclofenac sodium) .[name].. 1 by mouth two times a day 21)  Vitamin D (ergocalciferol) 50000 Unit Caps (Ergocalciferol) .Marland KitchenMarland KitchenMarland Kitchen 1 weekly x 12  Other Orders: Consultation Level IV [id number])  Patient Instructions: 1)  Will call with results of sleep apnea test 2)  Follow up in 8 weeks  Appended Document: sleep apena/cb Sleep study reprinted and given to medical records to scan into EMR and route to Dr Halford Chessman.  Appended Document: sleep apena/cb home sleep test showed AHI 8.4 and 13.9.  Results d/w pt and wife over the phone.  Reviewed options for tx OSA.  Will arrange for auto-CPAP titration. Pt has f/u scheduled for August 8.   Clinical Lists Changes  Orders: Added new Referral order of DME Referral (DME) - Signed      Appended Document: sleep apena/cb reviewed

## 2010-04-18 NOTE — Letter (Signed)
Summary: Remote Device Check  [facility], [place]  [id number] [place]   [place], [place] [address]   Phone: [phone]  Fax: [phone]     November 04, 2009 MRN: [id number]   [name] [address] [place], [place]  [address]   Dear Mr. Bisping,   Your remote transmission was recieved and reviewed by your physician.  All diagnostics were within normal limits for you.  __X___Your next transmission is scheduled for:  01-19-2010.  Please transmit at any time this day.  If you have a wireless device your transmission will be sent automatically.    Sincerely,  Shelly Bombard

## 2010-04-18 NOTE — Procedures (Signed)
Summary: [facility] Respiration Report  [facility] Respiration Report   Imported By: Sallee Provencal 08/31/2009 11:55:01  _____________________________________________________________________  External Attachment:    Type:   Image     Comment:   External Document

## 2010-04-18 NOTE — Assessment & Plan Note (Signed)
Summary: Cardiology Nuclear Testing  Nuclear Med Background Indications for Stress Test: Evaluation for Ischemia, Graft Patency, Post Hospital  Indications Comments: 11/21/09 CHF R/O MI  History: CABG, Defibrillator, Myocardial Infarction, Myocardial Perfusion Study  History Comments: 06/05 MI--CABG 12/05 Defibrilator 03/09 MPS EF 25% ICM  Symptoms: DOE, Palpitations, SOB    Nuclear Pre-Procedure Cardiac Risk Factors: Carotid Disease, Hypertension, LBBB, Lipids, NIDDM Caffeine/Decaff Intake: None NPO After: 10:30 PM Lungs: clear IV 0.9% NS with Angio Cath: 22g     IV Site: R Wrist IV Started by: Irven Baltimore, RN Chest Size (in) 46     Height (in): 73 Weight (lb): 203 BMI: 26.88 Tech Comments: Held carvedilol this am.  Nuclear Med Study 1 or 2 day study:  1 day     Stress Test Type:  Adenosine Reading MD:  [name], MD     Referring MD:  B.Crenshaw Resting Radionuclide:  Technetium 38m Tetrofosmin     Resting Radionuclide Dose:  11 mCi  Stress Radionuclide:  Technetium 42m Tetrofosmin     Stress Radionuclide Dose:  33 mCi   Stress Protocol  Dose of Adenosine:  51.7 mg    Stress Test Technologist:  Perrin Maltese, EMT-P     Nuclear Technologist:  Charlton Amor, CNMT  Rest Procedure  Myocardial perfusion imaging was performed at rest 45 minutes following the intravenous administration of Technetium 34m Tetrofosmin.  Stress Procedure  The patient received IV adenosine at 140 mcg/kg/min for 4 minutes. There were no significant changes and rare pvcs with infusion. Technetium 66m Tetrofosmin was injected at the 2 minute mark and quantitative spect images were obtained after a 45 minute delay.  QPS Raw Data Images:  Normal; no motion artifact; normal heart/lung ratio. Stress Images:  Severe mid to apical anterior and anterolateral and apical lateral perfusion defect.  Moderate inferior perfusion defect.  Rest Images:  Same as stress.  Subtraction (SDS):  Large,  fixed mid to apical anterior and anterolateral and apical lateral perfusion defect with fixed moderate inferior perfusion defect.  Transient Ischemic Dilatation:  0.89  (Normal <1.22)  Lung/Heart Ratio:  0.47  (Normal <0.45)  Quantitative Gated Spect Images QGS EDV:  362 ml QGS ESV:  259 ml QGS EF:  28 % QGS cine images:  Apical and peri-apical akinesis.    Overall Impression  Exercise Capacity: Adenosine study with no exercise. BP Response: Normal blood pressure response. Clinical Symptoms: Warm, short of breath.  ECG Impression: IVCD at baseline, brief 2nd degree AV block with infusion.  Overall Impression: Large, fixed mid to apical anterior and anteroseptal, apical lateral, and inferior perfusion defect.  This suggests large MI with no significant ischemia.  Overall Impression Comments: Severe systolic dysfunction with akinesis of the apex and peri-apical segments.   Appended Document: Cardiology Nuclear Testing ok  Appended Document: Cardiology Nuclear Testing pt aware of results

## 2010-04-18 NOTE — Letter (Signed)
Summary: Remote Device Check  [facility], [place]  [id number] [place]   [place], [place] [address]   Phone: [phone]  Fax: [phone]     April 20, 2009 MRN: [id number]   [place], [place]  [address]   Dear Mr. Potempa,   Your remote transmission was recieved and reviewed by your physician.  All diagnostics were within normal limits for you.    ___X___Your next office visit is scheduled for:    MAY 2011 [place]. Please call our office to schedule an appointment.    Sincerely,  Hotel manager

## 2010-04-18 NOTE — Progress Notes (Signed)
Summary: pt's wife calling for a referral  Phone Note Call from Patient   Caller: Patient [phone] Reason for Call: Talk to Nurse Summary of Call: pt had chf labor day weekend , has been weak ever since-legs are so weak he can hardly stand on them, today he fell trying to get up-wife calling to see what kind of dr he should see Initial call taken by: Lorenda Hatchet,  December 13, 2009 4:47 PM  Follow-up for Phone Call        spoke with pt, he states he is feeling alittle better but cont to feel very weak. he has not checked his bp. he denies SOB or chest pain and has no swelling. eph appt 10/24, pt reschedule to see dr Stanford Breed next week. he will check his bp and call me back if running too low Fredia Beets, RN  December 14, 2009 12:06 PM

## 2010-04-18 NOTE — Progress Notes (Signed)
Summary: Accu-check  Phone Note Refill Request Message from:  Fax from Pharmacy on December 12, 2009 5:23 PM  Refills Requested: Medication #1:  ACCU-CHEK COMFORT CURVE   STRP check blood sugar twice daily   Dosage confirmed as above?Dosage Confirmed Initial call taken by: Gardenia Phlegm RMA,  December 12, 2009 5:23 PM    Prescriptions: ACCU-CHEK COMFORT CURVE   STRP (GLUCOSE BLOOD) check blood sugar twice daily  #100 x 2   Entered by:   Gardenia Phlegm RMA   Authorized by:   Penni Homans MD   Signed by:   Gardenia Phlegm RMA on 12/12/2009   Method used:   Electronically to        [facility]. [id number]* (retail)       [place], [place]  [address]       Ph: [phone]       Fax: [phone]   RxID:   [id number]

## 2010-04-18 NOTE — Miscellaneous (Signed)
Summary: auto CPAP dowload 09/02/09 to 10/02/09  Clinical Lists Changes Used on 31 of 31 nights with average 6hrs 24 min.  Optimal pressure 7 cm H2O with average AHI 1.4.  Will have my nurse call to inform patient that CPAP report looked good, and will set pressure at 7 cm H2O. Orders: Added new Referral order of DME Referral (DME) - Signed  Appended Document: auto CPAP dowload 09/02/09 to 10/02/09 Patients spouse aware cpap report looks good and dr. Halford Chessman will have [place] at 7.

## 2010-04-18 NOTE — Assessment & Plan Note (Signed)
Summary: 9 MO F/U   Referring Provider:  Dr. Virl Axe Primary Provider:  Dr. Joni Fears  CC:  no complaints.  History of Present Illness: [name] is a pleasant gentleman who has a history of coronary artery disease, status post coronary bypassing graft, ischemic cardiomyopathy, CHF, and status post ICD.  His last echocardiogram was performed in October 2009.  At that time, the ejection fraction was 20-25% with mild- to-moderate aortic insufficiency and mild mitral regurgitation.  His last Myoview in March of 2009 showed an ejection fraction of 25% with a prior anterior, apical, and inferoapical infarct, but there was no ischemia.  His last carotid Dopplers in June 2011 and showed a 60-79% right and 40-59% left carotid stenosis. Followup was recommended in six months. I last saw him in November of 2010. Since then he has dyspnea with more moderate activities but not with routine activities. It is relieved with rest. There is no associated chest pain. There is no orthopnea, PND or worsening pedal edema. He has had no syncope or ICD discharges.  Current Medications (verified): 1)  Digitek 0.125 Mg Tabs (Digoxin) .... Once Daily 2)  Norvasc 5 Mg  Tabs (Amlodipine Besylate) .... Ran Out Once Daily 3)  Coreg 25 Mg  Tabs (Carvedilol) .... Two Times A Day 4)  Altace 10 Mg  Caps (Ramipril) .... Two Times A Day 5)  Spironolactone 25 Mg  Tabs (Spironolactone) .... Once Daily 6)  Furosemide 40 Mg  Tabs (Furosemide) .... Once Daily 7)  K-Lor 20 Meq  Pack (Potassium Chloride) .... Two Times A Day 8)  Lipitor 40 Mg  Tabs (Atorvastatin Calcium) .... Once Daily 9)  Fish Oil   Oil (Fish Oil) .[name].. 1 Once Daily 10)  Adult Aspirin Ec Low Strength 81 Mg  Tbec (Aspirin) .... Take 1 Tablet By Mouth Once A Day 11)  Glipizide 10 Mg  Tb24 (Glipizide) .... Two Times A Day 12)  Metformin Hcl 1000 Mg Tabs (Metformin Hcl) .... Take 1 By Mouth Two Times A Day For Diabetes 13)  Avandia 4 Mg Tabs (Rosiglitazone Maleate)  .[name].. 1 Two Times A Day For Diabetes 14)  Accu-Chek Comfort Curve   Strp (Glucose Blood) .... Check Blood Sugar Twice Daily 15)  Lexapro 10 Mg  Tabs (Escitalopram Oxalate) .... Once Daily 16)  Mirapex 0.5 Mg Tabs (Pramipexole Dihydrochloride) .[name].. 1 Once Daily For Restless Legs 17)  Flomax 0.4 Mg  Cp24 (Tamsulosin Hcl) .... Once Daily 18)  Diclofenac Sodium 75 Mg Tbec (Diclofenac Sodium) .[name].. 1 By Mouth Two Times A Day As Needed 19)  Nasonex 50 Mcg/act Susp (Mometasone Furoate) .... Two Sprays Once Daily 20)  Vitamin D (Ergocalciferol) 50000 Unit Caps (Ergocalciferol) .... Every 12 Days 21)  Niaspan 1000 Mg Cr-Tabs (Niacin (Antihyperlipidemic)) .[name].. 1 Tab By Mouth Once Daily 22)  Sleep Therapy .... As Directed  Allergies: No Known Drug Allergies  Past History:  Past Medical History: SYSTOLIC HEART FAILURE, CHRONIC (ICD-428.22) CARDIOMYOPATHY, ISCHEMIC (ICD-414.8) HYPERLIPIDEMIA (ICD-272.4) MYOCARDIAL INFARCTION, HX OF (ICD-412) HYPERTENSION (ICD-401.9) CEREBROVASCULAR DISEASE (ICD-437.9) CORONARY ARTERY BYPASS GRAFT, HX OF (ICD-V45.81) RESTLESS LEG SYNDROME (ICD-333.94) IMPLANTATION OF DEFIBRILLATOR,PRIMARY PREVENTION STJUDE (ICD-V45.02) VITAMIN D DEFICIENCY (ICD-268.9) HYPOTHYROIDISM (ICD-244.9) ALLERGIC RHINITIS (ICD-477.9) ANEMIA (ICD-285.9) DIABETES MELLITUS, TYPE II (ICD-250.00) OSA      - AHI 8.4, RDI 13.9  Past Surgical History: Reviewed history from 01/27/2009 and no changes required. 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 SURGEON:   Lilia Argue. Servando Snare, M.D.  Social History: Reviewed history from 01/27/2009 and no changes required.  No tobacco or ETOH abuse.  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   Review of Systems       Some arthralgias but no fevers or chills, productive cough, hemoptysis, dysphasia, odynophagia, melena, hematochezia, dysuria, hematuria, rash, seizure activity, orthopnea, PND, pedal edema, claudication. Remaining systems are negative.   Vital Signs:  Patient profile:   77 year old male Height:      73 inches Weight:      214 pounds BMI:     28.34 Pulse rate:   60 / minute Resp:     14 per minute BP sitting:   106 / 58  (left arm)  Vitals Entered By: Burnett Kanaris (November 07, 2009 9:40 AM)  Physical Exam  General:  Well-developed well-nourished in no acute distress.  Skin is warm and dry.  HEENT is normal.  Neck is supple. No thyromegaly. Bilateral bruits Chest is clear to auscultation with normal expansion.  Cardiovascular exam is regular rate and rhythm. 2/6 systolic ejection murmur no diastolic murmur Abdominal exam nontender or distended. No masses palpated. Extremities show no edema. neuro grossly intact     ICD Specifications Following MD:  Virl Axe, MD     ICD Vendor:  [employer]     ICD Model Number:  [phone]     ICD Serial Number:  [id number] ICD DOI:  03/08/2004     ICD Implanting MD:  Virl Axe, MD  Lead 1:    Location: [facility]     DOI: 03/08/2004     Model #: [id number]     Serial #: [id number]     Status: active  Indications::  ICM   ICD Follow Up ICD Dependent:  No      Episodes Coumadin:  No  Brady Parameters Mode VVI     Lower Rate Limit:  40      Tachy Zones VF:  240     VT:  200     VT1:  160     Impression & Recommendations:  Problem # 1:  CARDIOMYOPATHY, ISCHEMIC (ICD-414.8) Continue digoxin, beta blocker and ACE inhibitor. Plan repeat echocardiogram to assess LV function and aortic insufficiency/mitral regurgitation. The following medications  were removed from the medication list:    Norvasc 5 Mg Tabs (Amlodipine besylate) ..... Once daily His updated medication list for this problem includes:    Digitek 0.125 Mg Tabs (Digoxin) ..... Once daily    Coreg 25 Mg Tabs (Carvedilol) .[name]..[name] Two times a day    Altace 10 Mg Caps (Ramipril) .[name]..[name] Two times a day    Spironolactone 25 Mg Tabs (Spironolactone) ..... Once daily    Furosemide 40 Mg Tabs (Furosemide) ..... Once daily    Adult Aspirin Ec Low Strength 81 Mg Tbec (Aspirin) .[name]... Take 1 tablet by mouth once a day  Orders: Echocardiogram (Echo)  Problem # 2:  HYPERLIPIDEMIA (ICD-272.4) Continue present medications. Lipids and liver monitored by primary care. His updated medication list for this problem includes:    Lipitor 40 Mg Tabs (Atorvastatin calcium) ..... Once daily  Niaspan 1000 Mg Cr-tabs (Niacin (antihyperlipidemic)) .[name]... 1 tab by mouth once daily  Problem # 3:  HYPERTENSION (ICD-401.9) Blood pressure controlled. Check potassium and renal function. Note he has not taken his amlodipine for 2 weeks. Given that his blood pressure is controlled I will not resume this medication. The following medications were removed from the medication list:    Norvasc 5 Mg Tabs (Amlodipine besylate) ..... Once daily His updated medication list for this problem includes:    Coreg 25 Mg Tabs (Carvedilol) .[name]..[name] Two times a day    Altace 10 Mg Caps (Ramipril) .[name]..[name] Two times a day    Spironolactone 25 Mg Tabs (Spironolactone) ..... Once daily    Furosemide 40 Mg Tabs (Furosemide) ..... Once daily    Adult Aspirin Ec Low Strength 81 Mg Tbec (Aspirin) .[name]... Take 1 tablet by mouth once a day  Orders: TLB-BMP (Basic Metabolic Panel-BMET) (99991111)  Problem # 4:  CEREBROVASCULAR DISEASE (ICD-437.9) Continue aspirin and statin. Followup carotid Dopplers December 2011.  Problem # 5:  CORONARY ARTERY BYPASS GRAFT, HX OF (ICD-V45.81) Continue aspirin, ACE inhibitor, statin and beta  blocker. Repeat Myoview. Orders: Nuclear Stress Test (Nuc Stress Test)  Problem # 6:  IMPLANTATION OF [place] (ICD-V45.02) Management per EP.  Problem # 7:  HYPOTHYROIDISM (ICD-244.9)  Problem # 8:  DIABETES MELLITUS, TYPE II (ICD-250.00)  His updated medication list for this problem includes:    Altace 10 Mg Caps (Ramipril) .[name]..[name] Two times a day    Adult Aspirin Ec Low Strength 81 Mg Tbec (Aspirin) .[name]... Take 1 tablet by mouth once a day    Glipizide 10 Mg Tb24 (Glipizide) .[name]..[name] Two times a day    Metformin Hcl 1000 Mg Tabs (Metformin hcl) .[name]... Take 1 by mouth two times a day for diabetes    Avandia 4 Mg Tabs (Rosiglitazone maleate) .[name]... 1 two times a day for diabetes  Problem # 9:  OBSTRUCTIVE SLEEP APNEA (ICD-327.23)  Patient Instructions: 1)  Your physician recommends that you schedule a follow-up appointment in: ONE YEAR 2)  Your physician has recommended you make the following change in your medication: STOP AMLODIPINE 3)  Your physician has requested that you have an echocardiogram.  Echocardiography is a painless test that uses sound waves to create images of your heart. It provides your doctor with information about the size and shape of your heart and how well your heart's chambers and valves are working.  This procedure takes approximately one hour. There are no restrictions for this procedure. 4)  Your physician has requested that you have an exercise stress myoview.  For further information please visit [email or web address].  Please follow instruction sheet, as given.

## 2010-04-18 NOTE — Assessment & Plan Note (Signed)
Summary: Hospital Follow up/ CF   Vital Signs:  Patient profile:   77 year old male Height:      73 inches (185.42 cm) Weight:      201 pounds (91.36 kg) O2 Sat:      96 % on Room air Temp:     97.8 degrees F (36.56 degrees C) oral Pulse rate:   66 / minute BP sitting:   112 / 56  (left arm) Cuff size:   large  Vitals Entered By: Gardenia Phlegm RMA (December 05, 2009 10:10 AM)  O2 Flow:  Room air CC: Hospital follow up/ CF Is Patient Diabetic? Yes   History of Present Illness: patient is a 77 year old Caucasian male in today for hospital followup from the practice of Dr. Joni Fears. She was admitted initially and then transferred to [facility] following benign 03 2001 in with a sudden onset episode of congestive heart failure. He has a long-standing history of known coronary artery disease status post CABG in 2005 at [place] ICD implantation valvular insufficiency hypothyroidism and hypertension. He is noted to have a history of ischemic cardiomyopathy and has had episodes of sudden onset CHF in the past but never this severe. He awoke in the a.m. of 77 with acute onset shortness of breath and his wife had to call EMS to get him to the hospital in route he was given Solu-Medrol, albuterol, Lasix. He is also known to have sleep apnea and does routinely use a CPAP machine. In the hospital they adjusted his medications and titrate his fluids they asked him to check his weight daily. They have asked him to monitor his sodium intake and to avoid sodium whenever possible maintaining it most 2 g sodium diet daily. His weight loss is reviewed and his weights at home have steadily trended down upon release from the hospital he was 205.6 pounds his numbers did and have slowly decreased to 204.6, 203.2, 202.4, 201.2, 200.0,199, 196.2,  and now 196.0. with the weight loss we note his sugars have improved as well, prior hospitalization he was running in the 130s to 140s or significant morning.  Now his blood sugars are running 100-110 routinely. No polyuria no polydipsia, no chest pain, no palpitations, no fevers, no chills, no GU complaints. He does have recent onset of loose stool 1-2 daily with a previous history of constipation. No bloody or tarry stools are noted.   Patient has been compliant with his CPAP machine since being home, has been compliant with fluid restriction and sodium restriction. His major complaint today is of persistent fatigue. His shortness of breath since hospitalization is significantly improved.  Current Medications (verified): 1)  Digitek 0.125 Mg Tabs (Digoxin) .... Once Daily 2)  Coreg 25 Mg  Tabs (Carvedilol) .... Two Times A Day 3)  Altace 10 Mg  Caps (Ramipril) .... Two Times A Day 4)  Spironolactone 25 Mg  Tabs (Spironolactone) .... Once Daily 5)  Furosemide 40 Mg  Tabs (Furosemide) .... Once Daily 6)  K-Lor 20 Meq  Pack (Potassium Chloride) .... Two Times A Day 7)  Lipitor 40 Mg  Tabs (Atorvastatin Calcium) .... Once Daily 8)  Fish Oil   Oil (Fish Oil) .[name].. 1 Once Daily 9)  Adult Aspirin Ec Low Strength 81 Mg  Tbec (Aspirin) .... Take 1 Tablet By Mouth Once A Day 10)  Glipizide 10 Mg  Tb24 (Glipizide) .... Two Times A Day 11)  Metformin Hcl 1000 Mg Tabs (Metformin Hcl) .... Take 1  By Mouth Two Times A Day For Diabetes 12)  Avandia 4 Mg Tabs (Rosiglitazone Maleate) .[name].. 1 Two Times A Day For Diabetes 13)  Accu-Chek Comfort Curve   Strp (Glucose Blood) .... Check Blood Sugar Twice Daily 14)  Lexapro 10 Mg  Tabs (Escitalopram Oxalate) .... Once Daily 15)  Mirapex 0.5 Mg Tabs (Pramipexole Dihydrochloride) .[name].. 1 Once Daily For Restless Legs 16)  Flomax 0.4 Mg  Cp24 (Tamsulosin Hcl) .... Once Daily 17)  Diclofenac Sodium 75 Mg Tbec (Diclofenac Sodium) .[name].. 1 By Mouth Two Times A Day As Needed 18)  Nasonex 50 Mcg/act Susp (Mometasone Furoate) .... Two Sprays Once Daily 19)  Niaspan 1000 Mg Cr-Tabs (Niacin (Antihyperlipidemic)) .[name].. 1 Tab By Mouth Once  Daily 20)  Sleep Therapy .... As Directed  Allergies (verified): No Known Drug Allergies  Past History:  Past medical history reviewed for relevance to current acute and chronic problems. Social history (including risk factors) reviewed for relevance to current acute and chronic problems.  Past Medical History: Reviewed history from 11/07/2009 and no changes required. SYSTOLIC HEART FAILURE, CHRONIC (ICD-428.22) CARDIOMYOPATHY, ISCHEMIC (ICD-414.8) HYPERLIPIDEMIA (ICD-272.4) MYOCARDIAL INFARCTION, HX OF (ICD-412) HYPERTENSION (ICD-401.9) CEREBROVASCULAR DISEASE (ICD-437.9) CORONARY ARTERY BYPASS GRAFT, HX OF (ICD-V45.81) RESTLESS LEG SYNDROME (ICD-333.94) IMPLANTATION OF DEFIBRILLATOR,PRIMARY PREVENTION STJUDE (ICD-V45.02) VITAMIN D DEFICIENCY (ICD-268.9) HYPOTHYROIDISM (ICD-244.9) ALLERGIC RHINITIS (ICD-477.9) ANEMIA (ICD-285.9) DIABETES MELLITUS, TYPE II (ICD-250.00) OSA      - AHI 8.4, RDI 13.9  Social History: Reviewed history from 01/27/2009 and no changes required.  No tobacco or ETOH abuse.  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   Review of Systems      See HPI  Physical Exam  General:  Well-developed,well-nourished,in no acute distress; alert,appropriate and cooperative throughout examination Head:  Normocephalic and atraumatic without obvious abnormalities. No apparent alopecia or balding. Mouth:  Oral mucosa and oropharynx without lesions or exudates.  Teeth in good repair. Neck:  No deformities, masses, or tenderness noted. Lungs:  Normal respiratory effort, chest expands symmetrically. Lungs are clear to auscultation, no crackles or wheezes. Decreased breath sounds at bases Heart:  normal rate, regular rhythm, and systolic click.   Abdomen:  Bowel sounds positive,abdomen soft and non-tender without masses, organomegaly or hernias noted. Extremities:  No clubbing, cyanosis, edema, or deformity noted   Skin:  Intact without suspicious lesions  or rashes Cervical Nodes:  No lymphadenopathy noted Psych:  Cognition and judgment appear intact. Alert and cooperative with normal attention span and concentration. No apparent delusions, illusions, hallucinations   Impression & Recommendations:  Problem # 1:  SYSTOLIC HEART FAILURE, CHRONIC (ICD-428.22)  His updated medication list for this problem includes:    Digitek 0.125 Mg Tabs (Digoxin) ..... Once daily    Coreg 25 Mg Tabs (Carvedilol) .[name]..[name] Two times a day    Altace 10 Mg Caps (Ramipril) .[name]..[name] Two times a day    Spironolactone 25 Mg Tabs (Spironolactone) ..... Once daily    Furosemide 40 Mg Tabs (Furosemide) ..... Once daily    Adult Aspirin Ec Low Strength 81 Mg Tbec (Aspirin) .[name]... Take 1 tablet by mouth once a day  Orders: TLB-Renal Function Panel (80069-RENAL) TLB-TSH (Thyroid Stimulating Hormone) (84443-TSH) Venipuncture [id number]) Specimen Handling (99000) Prescription Created Electronically [phone]) Reviewed Echo and stress test with patient, he will follow up tiwh cardiology. Cont to avoid sodium and cont fluid restrictions and daily weights  Problem # 2:  HYPERTENSION (ICD-401.9)  His updated medication list for this problem includes:  Coreg 25 Mg Tabs (Carvedilol) .[name]..[name] Two times a day    Altace 10 Mg Caps (Ramipril) .[name]..[name] Two times a day    Spironolactone 25 Mg Tabs (Spironolactone) ..... Once daily    Furosemide 40 Mg Tabs (Furosemide) ..... Once daily Well controlled at this time  Problem # 3:  DIABETES MELLITUS, TYPE II (ICD-250.00)  The following medications were removed from the medication list:    Avandia 4 Mg Tabs (Rosiglitazone maleate) .[name]... 1 two times a day for diabetes His updated medication list for this problem includes:    Altace 10 Mg Caps (Ramipril) .[name]..[name] Two times a day    Adult Aspirin Ec Low Strength 81 Mg Tbec (Aspirin) .[name]... Take 1 tablet by mouth once a day    Glipizide 10 Mg Tb24 (Glipizide) .[name]..[name] Two times a day    Metformin Hcl  1000 Mg Tabs (Metformin hcl) .[name]... Take 1 by mouth two times a day for diabetes    Onglyza 5 Mg Tabs (Saxagliptin hcl) .[name]... 1 tab by mouth once daily With the worsening of his CHF he needs to stop Avandia, samples of Onglyza given call with any concerning numbers monitor bs qam and prn  Orders: Prescription Created Electronically [phone])  Problem # 4:  ARTHRITIS, HIP (ICD-716.95) D/C Diclofenac due to cardiac risk. Asked to use Tylenol and topically creams OTC as first line for pain relief, if pain is severe and debilitating may take Meloxicam 7.5 mg daily prn  Complete Medication List: 1)  Digitek 0.125 Mg Tabs (Digoxin) .... Once daily 2)  Coreg 25 Mg Tabs (Carvedilol) .... Two times a day 3)  Altace 10 Mg Caps (Ramipril) .... Two times a day 4)  Spironolactone 25 Mg Tabs (Spironolactone) .... Once daily 5)  Furosemide 40 Mg Tabs (Furosemide) .... Once daily 6)  K-lor 20 Meq Pack (Potassium chloride) .... Two times a day 7)  Lipitor 40 Mg Tabs (Atorvastatin calcium) .... Once daily 8)  Fish Oil Oil (Fish oil) .[name].. 1 once daily 9)  Adult Aspirin Ec Low Strength 81 Mg Tbec (Aspirin) .... Take 1 tablet by mouth once a day 10)  Glipizide 10 Mg Tb24 (Glipizide) .... Two times a day 11)  Metformin Hcl 1000 Mg Tabs (Metformin hcl) .... Take 1 by mouth two times a day for diabetes 12)  Accu-chek Comfort Curve Strp (Glucose blood) .... Check blood sugar twice daily 13)  Lexapro 10 Mg Tabs (Escitalopram oxalate) .... Once daily 14)  Mirapex 0.5 Mg Tabs (Pramipexole dihydrochloride) .[name].. 1 once daily for restless legs 15)  Flomax 0.4 Mg Cp24 (Tamsulosin hcl) .... Once daily 16)  Nasonex 50 Mcg/act Susp (Mometasone furoate) .... Two sprays once daily 17)  Niaspan 1000 Mg Cr-tabs (Niacin (antihyperlipidemic)) .[name].. 1 tab by mouth once daily 18)  Sleep Therapy  .... As directed 19)  Meloxicam 7.5 Mg Tabs (Meloxicam) .[name].. 1 tab by mouth once daily as needed severe pain with food 20)  Onglyza 5 Mg  Tabs (Saxagliptin hcl) .[name].. 1 tab by mouth once daily  Patient Instructions: 1)  Please schedule a follow-up appointment in 1 month.  2)  Limit your Sodium(salt) to less than 2 grams a day (slightly less than 1/2 teaspoon) to prevent fluid retention, swelling, or worsening of symptoms .  3)  Check your blood sugars regularly. If your readings are usually above: 200 or below 70 you should contact our office. Or if any concerns. Avoid simple carbs Prescriptions: ONGLYZA 5 MG TABS (SAXAGLIPTIN HCL) 1 tab  by mouth once daily  #28 x 0   Entered and Authorized by:   Penni Homans MD   Signed by:   Penni Homans MD on 12/05/2009   Method used:   Samples Given   RxID:   [id number] MELOXICAM 7.5 MG TABS (MELOXICAM) 1 tab by mouth once daily as needed severe pain with food  #30 x 0   Entered and Authorized by:   Penni Homans MD   Signed by:   Penni Homans MD on 12/05/2009   Method used:   Electronically to        [facility]. [id number]* (retail)       [place], [place]  [address]       Ph: [phone]       Fax: [phone]   RxID:   [id number]   Prevention & Chronic Care Immunizations   Influenza vaccine: Fluvax 3+  (01/26/2009)    Tetanus booster: Not documented    Pneumococcal vaccine: Pneumovax  (01/28/2008)    H. zoster vaccine: 03/19/2005: Zostavax  Colorectal Screening   Hemoccult: Not documented    Colonoscopy: Location:  [facility].    (06/23/2008)   Colonoscopy due: 06/2008  Other Screening   PSA: 1.99  (07/27/2009)   Smoking status: current smokeless tobacco use  (08/22/2009)  Diabetes Mellitus   HgbA1C: 7.5  (07/27/2009)    Eye exam: Not documented    Foot exam: Not documented   High risk foot: Not documented   Foot care education: Not documented    Urine microalbumin/creatinine ratio: 6.3  (07/14/2008)  Lipids   Total Cholesterol: 115  (07/27/2009)   LDL: 51  (07/27/2009)   LDL Direct: Not documented    HDL: 34.00  (07/27/2009)   Triglycerides: 151.0  (07/27/2009)    SGOT (AST): 23  (07/27/2009)   SGPT (ALT): 21  (07/27/2009)   Alkaline phosphatase: 41  (07/27/2009)   Total bilirubin: 0.8  (07/27/2009)  Hypertension   Last Blood Pressure: 112 / 56  (12/05/2009)   Serum creatinine: 1.3  (11/24/2009)   Serum potassium 4.7  (11/24/2009)  Self-Management Support :    Diabetes self-management support: Not documented    Hypertension self-management support: Not documented    Lipid self-management support: Not documented

## 2010-04-18 NOTE — Cardiovascular Report (Signed)
Summary: Office Visit Remote   Office Visit Remote   Imported By: Sallee Provencal 11/07/2009 13:20:44  _____________________________________________________________________  External Attachment:    Type:   Image     Comment:   External Document

## 2010-04-18 NOTE — Progress Notes (Signed)
Summary: home health orders  Phone Note From Other Clinic   Caller: Nurse Summary of Call: per Rosalie Doctor  home health nurse. needs new order for home health. [phone] Initial call taken by: Lorraine Lax,  December 23, 2009 3:18 PM  Follow-up for Phone Call        left message for eric okay to continue home health Fredia Beets, RN  December 23, 2009 4:43 PM

## 2010-04-18 NOTE — Letter (Signed)
Summary: Eye Exam/[facility]  Eye Exam/[facility]   Imported By: Laural Benes 07/15/2009 15:20:03  _____________________________________________________________________  External Attachment:    Type:   Image     Comment:   External Document

## 2010-04-18 NOTE — Progress Notes (Signed)
  Phone Note Outgoing Call   Summary of Call: Results of sleep study  Follow-up for Phone Call        Called patient with results of positive sleep study from 07/25/2009 and need for referral to [facility] per Dr. Caryl Comes. Follow-up by: Georgetta Haber RN

## 2010-04-18 NOTE — Letter (Signed)
Summary: Remote Device Check  [facility] [place]  [id number] [place]   [place], [place] [address]   Phone: [phone]  Fax: [phone]     January 30, 2010 MRN: [id number]   [name] [address] [place], [place]  [address]   Dear Mr. Glacken,   Your remote transmission was recieved and reviewed by your physician.  All diagnostics were within normal limits for you.  __X___Your next transmission is scheduled for:  04-20-2010.  Please transmit at any time this day.  If you have a wireless device your transmission will be sent automatically.   Sincerely,  Shelly Bombard

## 2010-04-18 NOTE — Assessment & Plan Note (Signed)
Summary: 4-6 weeks/apc   Visit Type:  Follow-up Copy to:  Dr. Virl Axe, Dr. Kirk Ruths Primary Provider/Referring Provider:  Dr. Joni Fears  CC:  OSA...the patient says he is doing well on cpap...no complaints today.  History of Present Illness: 77 yo male with mild sleep apnea.  He has been sleeping better since he got his new mask.  He goes to bed at 11pm, and falls asleep quickly.  He has to wake up every few hours to use the bathroom.  He gets out of bed at 730 am, but usually feels like he has no energy.  He is sleeping more sound, and is not snoring.  He was in the hospital over labor day for an exacerbation of his heart failure.  He feels better now.  He is napping for upto 2 hours in the afternoon, but is not using his CPAP when he naps.   Current Medications (verified): 1)  Digitek 0.125 Mg Tabs (Digoxin) .... Once Daily 2)  Coreg 25 Mg  Tabs (Carvedilol) .... Two Times A Day 3)  Altace 10 Mg  Caps (Ramipril) .... Two Times A Day 4)  Spironolactone 25 Mg  Tabs (Spironolactone) .... Once Daily 5)  Furosemide 40 Mg  Tabs (Furosemide) .... Once Daily 6)  Lipitor 40 Mg  Tabs (Atorvastatin Calcium) .... Once Daily 7)  Fish Oil   Oil (Fish Oil) .[name].. 1 Once Daily 8)  Adult Aspirin Ec Low Strength 81 Mg  Tbec (Aspirin) .... Take 1 Tablet By Mouth Once A Day 9)  Glipizide 10 Mg  Tb24 (Glipizide) .... Two Times A Day 10)  Metformin Hcl 1000 Mg Tabs (Metformin Hcl) .... Take 1 By Mouth Two Times A Day For Diabetes 11)  Accu-Chek Comfort Curve   Strp (Glucose Blood) .... Check Blood Sugar Twice Daily 12)  Lexapro 10 Mg  Tabs (Escitalopram Oxalate) .... Once Daily 13)  Mirapex 0.5 Mg Tabs (Pramipexole Dihydrochloride) .[name].. 1 Once Daily For Restless Legs 14)  Flomax 0.4 Mg  Cp24 (Tamsulosin Hcl) .... Once Daily 15)  Nasonex 50 Mcg/act Susp (Mometasone Furoate) .... Two Sprays Once Daily 16)  Niaspan 1000 Mg Cr-Tabs (Niacin (Antihyperlipidemic)) .[name].. 1 Tab By Mouth Once Daily 17)   Sleep Therapy .... As Directed 18)  Meloxicam 7.5 Mg Tabs (Meloxicam) .[name].. 1 Tab By Mouth Once Daily As Needed Severe Pain With Food 19)  Onglyza 5 Mg Tabs (Saxagliptin Hcl) .[name].. 1 Tab By Mouth Once Daily  Allergies (verified): No Known Drug Allergies  Past History:  Past Medical History: Last updated: 0000000 SYSTOLIC HEART FAILURE, CHRONIC (ICD-428.22) CARDIOMYOPATHY, ISCHEMIC (ICD-414.8) HYPERLIPIDEMIA (ICD-272.4) MYOCARDIAL INFARCTION, HX OF (ICD-412) HYPERTENSION (ICD-401.9) CEREBROVASCULAR DISEASE (ICD-437.9) CORONARY ARTERY BYPASS GRAFT, HX OF (ICD-V45.81) RESTLESS LEG SYNDROME (ICD-333.94) IMPLANTATION OF DEFIBRILLATOR,PRIMARY PREVENTION STJUDE (ICD-V45.02) VITAMIN D DEFICIENCY (ICD-268.9) HYPOTHYROIDISM (ICD-244.9) ALLERGIC RHINITIS (ICD-477.9) ANEMIA (ICD-285.9) DIABETES MELLITUS, TYPE II (ICD-250.00) OSA      - AHI 8.4, RDI 13.9  Past Surgical History: Reviewed history from 01/27/2009 and no changes required. 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 SURGEON:  Edward B. Servando Snare, M.D.  Vital Signs:  Patient profile:  77 year old male Height:      73 inches (185.42 cm) Weight:      200.50 pounds (91.14 kg) BMI:     26.55 O2 Sat:      96 % on Room air Temp:     97.7 degrees F (36.50 degrees C) oral Pulse rate:   71 / minute BP sitting:   112 / 70  (right arm) Cuff size:   regular  Vitals Entered By: Francesca Jewett CMA (December 08, 2009 1:50 PM)  O2 Sat at Rest %:  96 O2 Flow:  Room air CC: OSA...the patient says he is  doing well on cpap...no complaints today Comments Medications reviewed with patient Daytime phone verified. Francesca Jewett [facility]  December 08, 2009 1:51 PM   Physical Exam  General:  normal appearance, healthy appearing, and obese.   Nose:  clear nasal discharge.   Mouth:  MP 3, no exudate Neck:  no JVD.   Lungs:  diminished breath sounds, no wheezing or rales Heart:  regular rhythm and normal rate, 2/6 SM Extremities:  minimal ankle edema Neurologic:  normal CN II-XII and strength normal.   Cervical Nodes:  no significant adenopathy Psych:  alert and cooperative; normal mood and affect; normal attention span and concentration   Impression & Recommendations:  Problem # 1:  OBSTRUCTIVE SLEEP APNEA (ICD-327.23) He has done well with CPAP.  Will get a copy of his download.  Advised him to use his machine whenever he is asleep, including during naps.  Problem # 2:  RHINITIS (ICD-472.0) He has gustatory rhinitis.  Will have him try atrovent nasal spray as needed before meals.  He is to use nasonex as needed.  Problem # 3:  NOCTURIA [id number]) Advised him to speak with primary care and cardiology about whether he can take spironolactone earlier in the day.  Medications Added to Medication List This Visit: 1)  Atrovent 0.03 % Soln (Ipratropium bromide) .... Two sprays each nostril three times a day as needed  Complete Medication List: 1)  Digitek 0.125 Mg Tabs (Digoxin) .... Once daily 2)  Coreg 25 Mg Tabs (Carvedilol) .... Two times a day 3)  Altace 10 Mg Caps (Ramipril) .... Two times a day 4)  Spironolactone 25 Mg Tabs (Spironolactone) .... Once daily 5)  Furosemide 40 Mg Tabs (Furosemide) .... Once daily 6)  Lipitor 40 Mg Tabs (Atorvastatin calcium) .... Once daily 7)  Fish Oil Oil (Fish oil) .[name].. 1 once daily 8)  Adult Aspirin Ec Low Strength 81 Mg Tbec (Aspirin) .... Take 1 tablet by mouth once a day 9)  Glipizide 10 Mg Tb24 (Glipizide) .... Two times a day 10)  Metformin Hcl  1000 Mg Tabs (Metformin hcl) .... Take 1 by mouth two times a day for diabetes 11)  Accu-chek Comfort Curve Strp (Glucose blood) .... Check blood sugar twice daily 12)  Lexapro 10 Mg Tabs (Escitalopram oxalate) .... Once daily 13)  Mirapex 0.5 Mg Tabs (Pramipexole dihydrochloride) .[name].. 1 once daily for restless legs 14)  Flomax 0.4 Mg Cp24 (Tamsulosin hcl) .... Once daily 15)  Nasonex 50 Mcg/act Susp (Mometasone furoate) .... Two sprays once daily 16)  Niaspan 1000 Mg Cr-tabs (Niacin (antihyperlipidemic)) .[name].. 1 tab by mouth once daily 17)  Sleep Therapy  .... As directed 18)  Meloxicam 7.5 Mg Tabs (Meloxicam) .[name].. 1 tab by mouth once daily as needed severe pain with food 19)  Onglyza 5 Mg Tabs (Saxagliptin hcl) .[name].. 1 tab by mouth once daily 20)  Atrovent 0.03 % Soln (Ipratropium bromide) .... Two sprays each nostril three times a day as needed  Other Orders: Est. Patient Level III [id number]) DME Referral (DME)  Patient Instructions: 1)  Atrovent nasal spray two sprays each nostril three times a day as needed before meals 2)  Nasonex two sprays once daily as needed  3)  Will get report from CPAP machine 4)  Talk to your heart doctor about changing the time when you take spironolactone 5)  Follow up in 6 months Prescriptions: ATROVENT 0.03 % SOLN (IPRATROPIUM BROMIDE) two sprays each nostril three times a day as needed  #1 x 3   Entered and Authorized by:   Chesley Mires MD   Signed by:   Chesley Mires MD on 12/08/2009   Method used:   Electronically to        [facility]. [id number] (retail)       [place], [place]  [address]       Ph: [phone]       Fax: [phone]   RxID:   [id number]

## 2010-04-18 NOTE — Assessment & Plan Note (Signed)
Summary: [name]--WILL FAST//CCM   Vital Signs:  Patient profile:   77 year old male Height:      73 inches Weight:      221 pounds O2 Sat:      94 % Temp:     97.7 degrees F Pulse rate:   58 / minute BP sitting:   110 / 70  (left arm) Cuff size:   regular  Vitals Entered By: Levora Angel, RN (Jul 27, 2009 9:40 AM) CC: refills to [facility] fasting go over problems    History of Present Illness: This 77 year old white married male known diabetic with coronary artery disease is in to discuss his medical problems as well as refill his medications. He had colonoscopic exam in 2010 Zostavax 07 and Pneumovax was He relates in general he is feeling overwhelmed the left complains of some nasal congestion CBGs have run 100 to 1:30 Hypertension has been well-controlled he has had no cardiac symptoms and is to see Dr. Stanford Breed in approximately one week he had a problem with anxiety and depression which have been well-controlled with Lexapro Been controlled with Mirapex  Allergies (verified): No Known Drug Allergies  Past History:  Past Medical History: Last updated: 01/31/2009 Current Problems:  SYSTOLIC HEART FAILURE, CHRONIC (ICD-428.22) CARDIOMYOPATHY, ISCHEMIC (ICD-414.8) HYPERLIPIDEMIA (ICD-272.4) MYOCARDIAL INFARCTION, HX OF (ICD-412) HYPERTENSION (ICD-401.9) CEREBROVASCULAR DISEASE (ICD-437.9) CORONARY ARTERY BYPASS GRAFT, HX OF (ICD-V45.81) RESTLESS LEG SYNDROME (ICD-333.94) IMPLANTATION OF DEFIBRILLATOR,PRIMARY PREVENTION STJUDE (ICD-V45.02) VITAMIN D DEFICIENCY (ICD-268.9) HYPOTHYROIDISM (ICD-244.9) ALLERGIC RHINITIS (ICD-477.9) ANEMIA (ICD-285.9) DIABETES MELLITUS, TYPE II (ICD-250.00)  Past Surgical History: Last updated: 01/27/2009 Cataract extraction Cholecystectomy s/p  ICD -defibralllator s/p resection  left ventriculr apical aneursym   PROCEDURE:  Coronary artery bypass grafting x4 with left internal mammary to the diagonal coronary artery, reverse saphenous  vein graft to the LAD, reverse saphenous vein graft to the circumflex, reverse saphenous vein graft to the distal right coronary artery, and 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 SURGEON:  Lilia Argue. Servando Snare, M.D.  Social History: Last updated: 01/27/2009  No tobacco or ETOH abuse.  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   Risk Factors: Smoking Status: quit (09/25/2006)  Review of Systems      See HPI General:  See HPI; Denies chills, fatigue, fever, loss of appetite, malaise, sleep disorder, sweats, weakness, and weight loss. Eyes:  Denies blurring, discharge, double vision, eye irritation, eye pain, halos, itching, light sensitivity, red eye, vision loss-1 eye, and vision loss-both eyes. ENT:  Complains of nasal congestion. CV:  Denies bluish discoloration of lips or nails, chest pain or discomfort, difficulty breathing at night, difficulty breathing while lying down, fainting, fatigue, leg cramps with exertion, lightheadness, near fainting, palpitations, shortness of breath with exertion, swelling of feet, swelling of hands, and weight gain. Resp:  Denies chest discomfort, chest pain with inspiration, cough, coughing up blood, excessive snoring, hypersomnolence, morning headaches, pleuritic, shortness of breath, sputum productive, and wheezing. GI:  Denies abdominal pain, bloody stools, change in bowel habits, constipation, dark tarry stools, diarrhea, excessive appetite, gas, hemorrhoids, indigestion, loss of appetite, nausea, vomiting, vomiting blood, and yellowish skin color. GU:  Complains of nocturia; nocturia controlled with Flomax. MS:  Denies joint pain, joint redness, joint swelling, loss of strength, low back pain, mid back pain, muscle aches, muscle , cramps, muscle weakness, stiffness, and thoracic pain. Derm:  Denies changes in color of skin, changes in nail beds,  dryness, excessive perspiration,  flushing, hair loss, insect bite(s), itching, lesion(s), poor wound healing, and rash. Neuro:  Denies brief paralysis, difficulty with concentration, disturbances in coordination, falling down, headaches, inability to speak, memory loss, numbness, poor balance, seizures, sensation of room spinning, tingling, tremors, visual disturbances, and weakness. Psych:  Complains of anxiety and depression; control with Lexapro.  Physical Exam  General:  Well-developed,well-nourished,in no acute distress; alert,appropriate and cooperative throughout examination Head:  Normocephalic and atraumatic without obvious abnormalities. No apparent alopecia or balding. Eyes:  No corneal or conjunctival inflammation noted. EOMI. Perrla. Funduscopic exam benign, without hemorrhages, exudates or papilledema. Vision grossly normal. Ears:  External ear exam shows no significant lesions or deformities.  Otoscopic examination reveals clear canals, tympanic membranes are intact bilaterally without bulging, retraction, inflammation or discharge. Hearing is grossly normal bilaterally. Nose:  boggy pale nasal mucosa with clear drainage Mouth:  Oral mucosa and oropharynx without lesions or exudates.  Teeth in good repair. Neck:  No deformities, masses, or tenderness noted. Chest Wall:  CABG scar Lungs:  Normal respiratory effort, chest expands symmetrically. Lungs are clear to auscultation, no crackles or wheezes. Heart:  Normal rate and regular rhythm. S1 and S2 normal without gallop, murmur, click, rub or other extra sounds. Abdomen:  Bowel sounds positive,abdomen soft and non-tender without masses, organomegaly or hernias noted. Rectal:  No external abnormalities noted. Normal sphincter tone. No rectal masses or tenderness. Genitalia:  Testes bilaterally descended without nodularity, tenderness or masses. No scrotal masses or lesions. No penis lesions or urethral discharge. Prostate:  no nodules, no asymmetry, and 1+ enlarged.    Msk:  No deformity or scoliosis noted of thoracic or lumbar spine.   Pulses:  R and L carotid,radial,femoral,dorsalis pedis and posterior tibial pulses are full and equal bilaterally Extremities:  No clubbing, cyanosis, edema, or deformity noted with normal full range of motion of all joints.   Neurologic:  No cranial nerve deficits noted. Station and gait are normal. Plantar reflexes are down-going bilaterally. DTRs are symmetrical throughout. Sensory, motor and coordinative functions appear intact. Skin:  Intact without suspicious lesions or rashes Cervical Nodes:  No lymphadenopathy noted Axillary Nodes:  No palpable lymphadenopathy Inguinal Nodes:  No significant adenopathy Psych:  Cognition and judgment appear intact. Alert and cooperative with normal attention span and concentration. No apparent delusions, illusions, hallucinations   Impression & Recommendations:  Problem # 1:  SLEEP APNEA (ICD-780.57) Assessment Improved CPAP  Problem # 2:  ARTHRITIS, HIP (ICD-716.95) Assessment: Unchanged  diclofenac 75 mg b.i.d.  Orders: Prescription Created Electronically [phone])  Problem # 3:  DIABETES MELLITUS, TYPE II (ICD-250.00) Assessment: Improved  His updated medication list for this problem includes:    Glipizide 10 Mg Tb24 (Glipizide) .[name]..[name] Two times a day    Altace 10 Mg Caps (Ramipril) .[name]..[name] Two times a day    Adult Aspirin Ec Low Strength 81 Mg Tbec (Aspirin) .[name]... Take 1 tablet by mouth once a day    Metformin Hcl 1000 Mg Tabs (Metformin hcl) .[name]... Take 1 by mouth two times a day for diabetes    Avandia 4 Mg Tabs (Rosiglitazone maleate) .[name]... 1 two times a day for diabetes  Orders: TLB-A1C / Hgb A1C (Glycohemoglobin) (83036-A1C)  Problem # 4:  HYPERLIPIDEMIA (ICD-272.4) Assessment: Improved  His updated medication list for this problem includes:    Niaspan 1000 Mg Tbcr (Niacin (antihyperlipidemic)) ..... Once daily    Lipitor 40 Mg Tabs (Atorvastatin calcium) .....  Once daily  Orders: Venipuncture [id number]) TLB-Lipid  Panel (80061-LIPID) TLB-Hepatic/Liver Function Pnl (80076-HEPATIC)  Problem # 5:  HYPERTENSION (ICD-401.9) Assessment: Improved  His updated medication list for this problem includes:    Norvasc 5 Mg Tabs (Amlodipine besylate) ..... Once daily    Coreg 25 Mg Tabs (Carvedilol) .[name]..[name] Two times a day    Altace 10 Mg Caps (Ramipril) .[name]..[name] Two times a day    Furosemide 40 Mg Tabs (Furosemide) ..... Once daily    Spironolactone 25 Mg Tabs (Spironolactone) ..... Once daily  Problem # 6:  CORONARY ARTERY BYPASS GRAFT, HX OF (ICD-V45.81) Assessment: Improved  His updated medication list for this problem includes:    Norvasc 5 Mg Tabs (Amlodipine besylate) ..... Once daily    Coreg 25 Mg Tabs (Carvedilol) .[name]..[name] Two times a day    Altace 10 Mg Caps (Ramipril) .[name]..[name] Two times a day    Furosemide 40 Mg Tabs (Furosemide) ..... Once daily    Adult Aspirin Ec Low Strength 81 Mg Tbec (Aspirin) .[name]... Take 1 tablet by mouth once a day    Spironolactone 25 Mg Tabs (Spironolactone) ..... Once daily  Problem # 7:  RESTLESS LEG SYNDROME (ICD-333.94) Assessment: Improved  Complete Medication List: 1)  Diazepam 5 Mg Tabs (Diazepam) .... Take 1 tablet by mouth three times a day as needed--takes seldom 2)  Digitek 0.125 Mg Tabs (Digoxin) .... Once daily 3)  Glipizide 10 Mg Tb24 (Glipizide) .... Two times a day 4)  Accu-chek Comfort Curve Strp (Glucose blood) .... Check blood sugar twice daily 5)  Norvasc 5 Mg Tabs (Amlodipine besylate) .... Once daily 6)  Coreg 25 Mg Tabs (Carvedilol) .... Two times a day 7)  Altace 10 Mg Caps (Ramipril) .... Two times a day 8)  Furosemide 40 Mg Tabs (Furosemide) .... Once daily 9)  Niaspan 1000 Mg Tbcr (Niacin (antihyperlipidemic)) .... Once daily 10)  Lexapro 10 Mg Tabs (Escitalopram oxalate) .... Once daily 11)  Vitamin C 500 Mg  .... Once daily 12)  Adult Aspirin Ec Low Strength 81 Mg Tbec (Aspirin) .... Take 1  tablet by mouth once a day 13)  K-lor 20 Meq Pack (Potassium chloride) .... Two times a day 14)  Lipitor 40 Mg Tabs (Atorvastatin calcium) .... Once daily 15)  Flomax 0.4 Mg Cp24 (Tamsulosin hcl) .... Once daily 16)  Spironolactone 25 Mg Tabs (Spironolactone) .... Once daily 17)  Metformin Hcl 1000 Mg Tabs (Metformin hcl) .... Take 1 by mouth two times a day for diabetes 18)  Mirapex 0.5 Mg Tabs (Pramipexole dihydrochloride) .[name].. 1 once daily for restless legs 19)  Avandia 4 Mg Tabs (Rosiglitazone maleate) .[name].. 1 two times a day for diabetes 20)  Fish Oil Oil (Fish oil) .[name].. 1 once daily 21)  Diclofenac Sodium 75 Mg Tbec (Diclofenac sodium) .[name].. 1 by mouth two times a day 22)  Vitamin D (ergocalciferol) 50000 Unit Caps (Ergocalciferol) .Marland KitchenMarland KitchenMarland Kitchen 1 weekly x 12  Other Orders: T-Vitamin D (25-Hydroxy) [id number]) TLB-BMP (Basic Metabolic Panel-BMET) (99991111) TLB-CBC Platelet - w/Differential (85025-CBCD) TLB-TSH (Thyroid Stimulating Hormone) (84443-TSH) TLB-PSA (Prostate Specific Antigen) (84153-PSA)  Patient Instructions: 1)  in [place] doing her well on present medications and your continues sure about her medical problems and taking care of yourself 2)  Refilled her medication 3)  Will call results of lab studies Prescriptions: VITAMIN D (ERGOCALCIFEROL) 50000 UNIT CAPS (ERGOCALCIFEROL) 1 weekly x 12  #12 x 1   Entered and Authorized by:   Emeterio Reeve MD   Signed by:   Melody Comas  Jr MD on 08/04/2009   Method used:   Faxed to ...       [facility]* (mail-order)             ,          Ph: [phone]       Fax: [phone]   [id number] DICLOFENAC SODIUM 75 MG TBEC (DICLOFENAC SODIUM) 1 by mouth two times a day  #180 x 3   Entered by:   Levora Angel, RN   Authorized by:   Emeterio Reeve MD   Signed by:   Levora Angel, RN on 07/27/2009   Method used:   Faxed to ...       [facility]* (mail-order)             ,          Ph:  [phone]       Fax: [phone]   [id number] AVANDIA 4 MG TABS (ROSIGLITAZONE MALEATE) 1 two times a day for diabetes  #180 x 3   Entered by:   Levora Angel, RN   Authorized by:   Emeterio Reeve MD   Signed by:   Levora Angel, RN on 07/27/2009   Method used:   Faxed to ...       [facility]* (mail-order)             ,          Ph: [phone]       Fax: [phone]   [id number] MIRAPEX 0.5 MG TABS (PRAMIPEXOLE DIHYDROCHLORIDE) 1 once daily for restless legs  #90 x 3   Entered by:   Levora Angel, RN   Authorized by:   Emeterio Reeve MD   Signed by:   Levora Angel, RN on 07/27/2009   Method used:   Faxed to ...       [facility]* (mail-order)             ,          Ph: [phone]       Fax: [phone]   [id number] METFORMIN HCL 1000 MG TABS (METFORMIN HCL) take 1 by mouth two times a day for diabetes  #180 x 3   Entered by:   Levora Angel, RN   Authorized by:   Emeterio Reeve MD   Signed by:   Levora Angel, RN on 07/27/2009   Method used:   Faxed to ...       [facility]* (mail-order)             ,          Ph: [phone]       Fax: [phone]   RxID:   [id number] FLOMAX 0.4 MG  CP24 (TAMSULOSIN HCL) once daily  #90 x 3   Entered by:   Levora Angel, RN   Authorized by:   Emeterio Reeve MD   Signed by:   Levora Angel, RN on 07/27/2009   Method used:   Faxed to ...       [facility] (mail-order)             ,          Ph: [phone]       Fax: [phone]   RxID:   [id number] [place] 10 MG  TABS (ESCITALOPRAM OXALATE) once daily  #90 x 3   Entered by:   Levora Angel, RN   Authorized by:   Emeterio Reeve MD   Signed by:   Levora Angel, RN on 07/27/2009   Method used:   Faxed to ...       [facility] (mail-order)             ,          Ph: [phone]       Fax: [phone]   RxID:   [id number] GLIPIZIDE 10 MG  TB24 (GLIPIZIDE) two times a day   #180 x 3   Entered by:   Levora Angel, RN   Authorized by:   Emeterio Reeve MD   Signed by:   Levora Angel, RN on 07/27/2009   Method used:   Faxed to ...       [facility]* (mail-order)             ,          Ph: [phone]       Fax: [phone]   [id number] DIAZEPAM 5 MG TABS (DIAZEPAM) Take 1 tablet by mouth three times a day as needed--takes seldom  #90 x 1   Entered by:   Levora Angel, RN   Authorized by:   Emeterio Reeve MD   Signed by:   Levora Angel, RN on 07/27/2009   Method used:   Printed then faxed to ...       [facility]* (mail-order)             ,          Ph: [phone]       Fax: [phone]   RxIDFW:[id number] K-LOR 20 MEQ  PACK (POTASSIUM CHLORIDE) two times a day  #180 x 3   Entered and Authorized by:   Emeterio Reeve MD   Signed by:   Emeterio Reeve MD on 07/27/2009   Method used:   Faxed to ...       [place] (mail-order)             ,          Ph: [phone]       Fax: [phone]   [id number] LEXAPRO 10 MG  TABS (ESCITALOPRAM OXALATE) once daily  #90 x 3   Entered and Authorized by:   Emeterio Reeve MD   Signed by:   Emeterio Reeve MD on 07/27/2009   Method used:   Faxed to ...       [place] (mail-order)             ,          Ph: [phone]       Fax: [phone]   [id number] NIASPAN 1000 MG  TBCR (NIACIN (ANTIHYPERLIPIDEMIC)) once daily  #90 x 3   Entered and Authorized by:   Emeterio Reeve MD   Signed by:   Emeterio Reeve MD on 07/27/2009   Method used:   Faxed to ...       [place] (mail-order)             ,          Ph: [phone]       Fax:  [phone]   [id number] FUROSEMIDE 40 MG  TABS (FUROSEMIDE) once daily  #90 x 3   Entered and Authorized by:   Emeterio Reeve MD   Signed by:   Emeterio Reeve MD on 07/27/2009   Method used:   Faxed to ...       [place]  (mail-order)             ,          Ph: [phone]       Fax: [phone]   [id number] ALTACE 10 MG  CAPS (RAMIPRIL) two times a day  #180 x 3   Entered and Authorized by:   Emeterio Reeve MD   Signed by:   Emeterio Reeve MD on 07/27/2009   Method used:   Faxed to ...       [facility]* (mail-order)             ,          Ph: [phone]       Fax: [phone]   RxID:   [id number] COREG 25 MG  TABS (CARVEDILOL) two times a day  #180 x 3   Entered and Authorized by:   Emeterio Reeve MD   Signed by:   Emeterio Reeve MD on 07/27/2009   Method used:   Faxed to ...       [place] (mail-order)             ,          Ph: [phone]       Fax: [phone]   RxID:   [id number] NORVASC 5 MG  TABS (AMLODIPINE BESYLATE) once daily  #90 x 3   Entered and Authorized by:   Emeterio Reeve MD   Signed by:   Emeterio Reeve MD on 07/27/2009   Method used:   Faxed to ...       [place] (mail-order)             ,          Ph: [phone]       Fax: [phone]   RxID:   [id number] GLIPIZIDE 10 MG  TB24 (GLIPIZIDE) two times a day  #180 x 3   Entered and Authorized by:   Emeterio Reeve MD   Signed by:   Emeterio Reeve MD on 07/27/2009   Method used:   Faxed to ...       [facility]* (mail-order)             ,          Ph: [phone]       Fax: [phone]   [id number] DIAZEPAM 5 MG TABS (DIAZEPAM) Take 1 tablet by mouth three times a day as needed--takes seldom  #90 x 5   Entered and Authorized by:   Emeterio Reeve MD   Signed by:   Emeterio Reeve MD on 07/27/2009   Method used:   Print then Give to Patient   RxID:   [id number]

## 2010-04-18 NOTE — Miscellaneous (Signed)
Summary: HIPAA Restrictions  HIPAA Restrictions   Imported By: Bonner Puna 01/04/2010 10:30:11  _____________________________________________________________________  External Attachment:    Type:   Image     Comment:   External Document

## 2010-04-18 NOTE — Letter (Signed)
Summary: Results Follow-up Letter  [name]   [place], [place] [address]   Phone: [phone]  Fax: [phone]    09/05/2009  [name], [place]  [id number]  Dear [name],   The following are the results of your recent test(s):  H[facility]ocult cards were all negative.     Sincerely,     Dr Viona Gilmore.R.Stafford,MD   at [facility]

## 2010-04-20 ENCOUNTER — Encounter: Payer: Self-pay | Admitting: Internal Medicine

## 2010-04-20 ENCOUNTER — Ambulatory Visit: Admit: 2010-04-20 | Payer: Self-pay | Admitting: Internal Medicine

## 2010-04-20 ENCOUNTER — Encounter (INDEPENDENT_AMBULATORY_CARE_PROVIDER_SITE_OTHER): Payer: Medicare Other

## 2010-04-20 DIAGNOSIS — Z95810 Presence of automatic (implantable) cardiac defibrillator: Secondary | ICD-10-CM

## 2010-04-20 DIAGNOSIS — I428 Other cardiomyopathies: Secondary | ICD-10-CM

## 2010-04-20 NOTE — Assessment & Plan Note (Signed)
Summary: 2 month rov/njr   Vital Signs:  Patient profile:   77 year old male Weight:      198 pounds Temp:     98.4 degrees F oral Pulse rate:   68 / minute Pulse rhythm:   regular BP sitting:   108 / 64  (left arm) Cuff size:   large  Vitals Entered By: Townsend Roger, CMA (March 30, 2010 11:35 AM) CC: discuss labs   History of Present Illness: This 77 year old white married male was hospitalized in September for congestive heart failure in the care of Dr. Herb Grays is in for a followup examination today. He relates he's been doing relatively well no cough congestion does complain of weakness in both legs after having congestive heart failure. Discussed exercise and walk and an attempt to strengthen the lower extremities he is in to discuss his lab test is also weak obtain a digoxin level which was oh 0.7 i will follow this to Dr. Stanford Breed CABG have been 110-120\par Patient recently treated for basal cell carcinoma of the right chest by Dr. Josph Macho locked in No other complaint sleeping better since having CPAP for the obstructive sleep apnea emotionally doing better but continues to need the Lexapro dilated Blood pressure under good control  Current Medications (verified): 1)  Digitek 0.125 Mg Tabs (Digoxin) .... Once Daily 2)  Carvedilol 12.5 Mg Tabs (Carvedilol) .... Take One Tablet By Mouth Twice A Day 3)  Ramipril 5 Mg Caps (Ramipril) .... Take One Capsule By Mouth Daily 4)  Spironolactone 25 Mg  Tabs (Spironolactone) .... Once Daily 5)  Furosemide 40 Mg  Tabs (Furosemide) .... Once Daily 6)  Lipitor 40 Mg  Tabs (Atorvastatin Calcium) .... Once Daily 7)  Fish Oil   Oil (Fish Oil) .[name].. 1 Twice A Day 8)  Adult Aspirin Ec Low Strength 81 Mg  Tbec (Aspirin) .... Take 1 Tablet By Mouth Once A Day 9)  Glipizide 10 Mg  Tb24 (Glipizide) .... Two Times A Day 10)  Metformin Hcl 1000 Mg Tabs (Metformin Hcl) .... Take 1 By Mouth Two Times A Day For Diabetes 11)  Accu-Chek Comfort Curve    Strp (Glucose Blood) .... Check Blood Sugar Twice Daily 12)  Lexapro 10 Mg  Tabs (Escitalopram Oxalate) .... Once Daily 13)  Mirapex 0.5 Mg Tabs (Pramipexole Dihydrochloride) .[name].. 1 Once Daily For Restless Legs 14)  Flomax 0.4 Mg  Cp24 (Tamsulosin Hcl) .... Once Daily 15)  Nasonex 50 Mcg/act Susp (Mometasone Furoate) .... Two Sprays Once Daily 16)  Niaspan 1000 Mg Cr-Tabs (Niacin (Antihyperlipidemic)) .[name].. 1 Tab By Mouth Once Daily 17)  Sleep Therapy .... As Directed 18)  Meloxicam 7.5 Mg Tabs (Meloxicam) .[name].. 1 Tab By Mouth Once Daily As Needed Severe Pain With Food 19)  Onglyza 5 Mg Tabs (Saxagliptin Hcl) .[name].. 1 Tab By Mouth Once Daily 20)  Atrovent 0.03 % Soln (Ipratropium Bromide) .... Two Sprays Each Nostril Three Times A Day As Needed  Allergies (verified): No Known Drug Allergies  Past History:  Past Medical History: Last updated: [id number] SYSTOLIC HEART FAILURE, CHRONIC (ICD-428.22) CARDIOMYOPATHY, ISCHEMIC (ICD-414.8) HYPERLIPIDEMIA (ICD-272.4) MYOCARDIAL INFARCTION, HX OF (ICD-412) HYPERTENSION (ICD-401.9) CEREBROVASCULAR DISEASE (ICD-437.9) CORONARY ARTERY BYPASS GRAFT, HX OF (ICD-V45.81) RESTLESS LEG SYNDROME (ICD-333.94) IMPLANTATION OF DEFIBRILLATOR,PRIMARY PREVENTION STJUDE (ICD-V45.02) VITAMIN D DEFICIENCY (ICD-268.9) HYPOTHYROIDISM (ICD-244.9) ALLERGIC RHINITIS (ICD-477.9) ANEMIA (ICD-285.9) DIABETES MELLITUS, TYPE II (ICD-250.00) OSA      - AHI 8.4, RDI 13.9 Cold agglutinins Lymphadenopathy  Past Surgical History: Last updated: 01/27/2009 Cataract  extraction Cholecystectomy s/p  ICD -defibralllator s/p resection  left ventriculr apical aneursym   PROCEDURE:  Coronary artery bypass grafting x4 with left internal mammary to the diagonal coronary artery, reverse saphenous vein graft to the LAD, reverse saphenous vein graft to the circumflex, reverse saphenous vein graft to the distal right coronary artery, and resection and closure of anterior apical LV  aneurysm and removal of mural thrombus, and endovein harvest. SURGEON:  Edward B. Servando Snare, M.D.  Social History: Last updated: 01/27/2009  No tobacco or ETOH abuse.  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   Risk Factors: Caffeine Use: none (03/02/2010) Exercise: yes (03/02/2010)  Risk Factors: Smoking Status: current smokeless tobacco use (03/02/2010) Cans of tobacco/wk: ? (03/02/2010)  Review of Systems      See HPI General:  See HPI; Complains of weakness. Eyes:  Denies blurring, discharge, double vision, eye irritation, eye pain, halos, itching, light sensitivity, red eye, vision loss-1 eye, and vision loss-both eyes. ENT:  Denies decreased hearing, difficulty swallowing, ear discharge, earache, hoarseness, nasal congestion, nosebleeds, postnasal drainage, ringing in ears, sinus pressure, and sore throat. CV:  See HPI; Complains of swelling of feet. Resp:  Denies chest discomfort, chest pain with inspiration, cough, coughing up blood, excessive snoring, hypersomnolence, morning headaches, pleuritic, shortness of breath, sputum productive, and wheezing. GI:  Denies abdominal pain, bloody stools, change in bowel habits, constipation, dark tarry stools, diarrhea, excessive appetite, gas, hemorrhoids, indigestion, loss of appetite, nausea, vomiting, vomiting blood, and yellowish skin color. GU:  Denies decreased libido, discharge, dysuria, erectile dysfunction, genital sores, hematuria, incontinence, nocturia, urinary frequency, and urinary hesitancy. MS:  Denies joint pain, joint redness, joint swelling, loss of strength, low back pain, mid back pain, muscle aches, muscle , cramps, muscle weakness, stiffness, and thoracic pain.  Physical Exam  General:  Well-developed,well-nourished,in no acute distress; alert,appropriate and cooperative throughout examination Head:  Normocephalic and atraumatic without obvious abnormalities. No apparent alopecia or balding. Eyes:  No  corneal or conjunctival inflammation noted. EOMI. Perrla. Funduscopic exam benign, without hemorrhages, exudates or papilledema. Vision grossly normal. Ears:  External ear exam shows no significant lesions or deformities.  Otoscopic examination reveals clear canals, tympanic membranes are intact bilaterally without bulging, retraction, inflammation or discharge. Hearing is grossly normal bilaterally. Nose:  External nasal examination shows no deformity or inflammation. Nasal mucosa are pink and moist without lesions or exudates. Mouth:  Oral mucosa and oropharynx without lesions or exudates.  Teeth in good repair. Chest Wall:  CABG scar Lungs:  Normal respiratory effort, chest expands symmetrically. Lungs are clear to auscultation, no crackles or wheezes. Heart:  Normal rate and regular rhythm. S1 and S2 normal without gallop, murmur, click, rub or other extra sounds. Abdomen:  Bowel sounds positive,abdomen soft and non-tender without masses, organomegaly or hernias noted. Msk:  noted weakness on trying to stand from sitting position in chair to stand and Extremities:  trace pretibial edema   Impression & Recommendations:  Problem # 1:  SYSTOLIC HEART FAILURE, CHRONIC (ICD-428.22) Assessment Improved  His updated medication list for this problem includes:    Digitek 0.125 Mg Tabs (Digoxin) ..... Once daily    Carvedilol 12.5 Mg Tabs (Carvedilol) .[name]... Take one tablet by mouth twice a day    Ramipril 5 Mg Caps (Ramipril) .[name]... Take one capsule by mouth daily    Spironolactone 25 Mg Tabs (Spironolactone) ..... Once daily    Furosemide 40 Mg Tabs (Furosemide) ..... Once daily  Adult Aspirin Ec Low Strength 81 Mg Tbec (Aspirin) .[name]... Take 1 tablet by mouth once a day  Problem # 2:  DIABETES MELLITUS, TYPE II (ICD-250.00) Assessment: Improved  His updated medication list for this problem includes:    Ramipril 5 Mg Caps (Ramipril) .[name]... Take one capsule by mouth daily    Adult Aspirin Ec  Low Strength 81 Mg Tbec (Aspirin) .[name]... Take 1 tablet by mouth once a day    Glipizide 10 Mg Tb24 (Glipizide) .[name]..[name] Two times a day    Metformin Hcl 1000 Mg Tabs (Metformin hcl) .[name]... Take 1 by mouth two times a day for diabetes    Onglyza 5 Mg Tabs (Saxagliptin hcl) .[name]... 1 tab by mouth once daily  Problem # 3:  OBSTRUCTIVE SLEEP APNEA (ICD-327.23) Assessment: Improved  Problem # 4:  ARTHRITIS, HIP (ICD-716.95) Assessment: Unchanged  Problem # 5:  HYPERTENSION (ICD-401.9) Assessment: Improved  His updated medication list for this problem includes:    Carvedilol 12.5 Mg Tabs (Carvedilol) .[name]... Take one tablet by mouth twice a day    Ramipril 5 Mg Caps (Ramipril) .[name]... Take one capsule by mouth daily    Spironolactone 25 Mg Tabs (Spironolactone) ..... Once daily    Furosemide 40 Mg Tabs (Furosemide) ..... Once daily  Problem # 6:  IMPLANTATION OF DEFIBRILLATOR,PRIMARY PREVENTION STJUDE (ICD-V45.02) Assessment: Unchanged  Complete Medication List: 1)  Digitek 0.125 Mg Tabs (Digoxin) .... Once daily 2)  Carvedilol 12.5 Mg Tabs (Carvedilol) .... Take one tablet by mouth twice a day 3)  Ramipril 5 Mg Caps (Ramipril) .... Take one capsule by mouth daily 4)  Spironolactone 25 Mg Tabs (Spironolactone) .... Once daily 5)  Furosemide 40 Mg Tabs (Furosemide) .... Once daily 6)  Lipitor 40 Mg Tabs (Atorvastatin calcium) .... Once daily 7)  Fish Oil Oil (Fish oil) .[name].. 1 twice a day 8)  Adult Aspirin Ec Low Strength 81 Mg Tbec (Aspirin) .... Take 1 tablet by mouth once a day 9)  Glipizide 10 Mg Tb24 (Glipizide) .... Two times a day 10)  Metformin Hcl 1000 Mg Tabs (Metformin hcl) .... Take 1 by mouth two times a day for diabetes 11)  Accu-chek Comfort Curve Strp (Glucose blood) .... Check blood sugar twice daily 12)  Lexapro 10 Mg Tabs (Escitalopram oxalate) .... Once daily 13)  Mirapex 0.5 Mg Tabs (Pramipexole dihydrochloride) .[name].. 1 once daily for restless legs 14)  Flomax 0.4 Mg Cp24  (Tamsulosin hcl) .... Once daily 15)  Nasonex 50 Mcg/act Susp (Mometasone furoate) .... Two sprays once daily 16)  Niaspan 1000 Mg Cr-tabs (Niacin (antihyperlipidemic)) .[name].. 1 tab by mouth once daily 17)  Sleep Therapy  .... As directed 18)  Meloxicam 7.5 Mg Tabs (Meloxicam) .[name].. 1 tab by mouth once daily as needed severe pain with food 19)  Onglyza 5 Mg Tabs (Saxagliptin hcl) .[name].. 1 tab by mouth once daily 20)  Atrovent 0.03 % Soln (Ipratropium bromide) .... Two sprays each nostril three times a day as needed  Patient Instructions: 1)  lab studies were good except digoxin 0.7 to send report to Dr. Stanford Breed 2)  Continue other medications as instructed 3)  Increase physical activity everyday exercise standing from a sitting position in the chair   Orders Added: 1)  Est. Patient Level IV [id number]

## 2010-04-20 NOTE — Letter (Signed)
Summary: Discharge Summary  Discharge Summary   Imported By: Sallee Provencal 03/30/2010 10:57:16  _____________________________________________________________________  External Attachment:    Type:   Image     Comment:   External Document

## 2010-04-20 NOTE — Letter (Signed)
Summary: [place] By: Marilynne Drivers 03/01/2010 10:26:58  _____________________________________________________________________  External Attachment:    Type:   Image     Comment:   External Document

## 2010-04-20 NOTE — Assessment & Plan Note (Signed)
Summary: 28month f/u [mkj]   Visit Type:  Follow-up Referring Shandel Busic:  Dr. Virl Axe, Dr. Kirk Ruths Primary Sevin Farone:  Dr. Joni Fears  CC:  follow-up visit, no problems, and doing better .  History of Present Illness: 77 year old with cardiomyopathy, CHF who was admitted to [facility] last month after syncope, low grade fevers, leukocytosis with presence of cold agglutinins and also found to have retroperitoneal lymphadenopthy and inguinal LA thought LN were borderline enlarged. He had iniital workup in the hospitla which included negative blood cultures, negative mycoplasma abs, negative EBV and CMV panels for acute infection, negative quantiferon gold, histoplasma ag in urine. We tried to get excisional LN biopsy for path and culture but surgery and Radiology felt LN too small to make this worthwhile. I rechecked his cold agglutinins in clinic (negative), histo plasma abs, crypto ag--both negaive. Patient was  apparently seen by Dr Jamse Arn who felt no need for repeat CT of the abdomen and pelvis. Patient has felt well and been withotu fevers, malaise or other systemic symptoms.dHE  claims he has minimal weight loss due to intentional weight loss, dieting.   Problems Prior to Update: 1)  Lesion, Face  (ICD-238.2) 2)  Other Spec Forms Chronic Ischemic Heart Disease  (ICD-414.8) 3)  Coronary Atherosclerosis Native Coronary Artery  (ICD-414.01) 4)  Coronary Atherosclerosis Native Coronary Artery  (ICD-414.01) 5)  Enlargement of Lymph Nodes  (ICD-785.6) 6)  Autoimmune Hemolytic Anemias  (ICD-283.0) 7)  Weight Loss  (ICD-783.21) 8)  Unspecified Anemia  (ICD-285.9) 9)  Rhinitis  (ICD-472.0) 10)  Uns Advrs Eff Oth Rx Medicinal&biological Sbstnc  (ICD-995.29) 11)  Obstructive Sleep Apnea  (ICD-327.23) 12)  Arthritis, Hip  (0000000) 13)  Systolic Heart Failure, Chronic  (ICD-428.22) 14)  Cardiomyopathy, Ischemic  (ICD-414.8) 15)  Hyperlipidemia  (ICD-272.4) 16)  Hypertension  (ICD-401.9) 17)   Cerebrovascular Disease  (ICD-437.9) 18)  Coronary Artery Bypass Graft, Hx of  (ICD-V45.81) 19)  Nocturia  (ICD-788.43) 20)  Restless Leg Syndrome  (ICD-333.94) 21)  Implantation of Defibrillator,primary Prevention Stjude  (ICD-V45.02) 22)  Vitamin D Deficiency  (ICD-268.9) 23)  Hypothyroidism  (ICD-244.9) 24)  Special Screening Malignant Neoplasm of Prostate  (ICD-V76.44) 25)  Diabetes Mellitus, Type II  (ICD-250.00)  Medications Prior to Update: 1)  Digitek 0.125 Mg Tabs (Digoxin) .... Once Daily 2)  Carvedilol 12.5 Mg Tabs (Carvedilol) .... Take One Tablet By Mouth Twice A Day 3)  Ramipril 5 Mg Caps (Ramipril) .... Take One Capsule By Mouth Daily 4)  Spironolactone 25 Mg  Tabs (Spironolactone) .... Once Daily 5)  Furosemide 40 Mg  Tabs (Furosemide) .... Once Daily 6)  Lipitor 40 Mg  Tabs (Atorvastatin Calcium) .... Once Daily 7)  Fish Oil   Oil (Fish Oil) .[name].. 1 Twice A Day 8)  Adult Aspirin Ec Low Strength 81 Mg  Tbec (Aspirin) .... Take 1 Tablet By Mouth Once A Day 9)  Glipizide 10 Mg  Tb24 (Glipizide) .... Two Times A Day 10)  Metformin Hcl 1000 Mg Tabs (Metformin Hcl) .... Take 1 By Mouth Two Times A Day For Diabetes 11)  Accu-Chek Comfort Curve   Strp (Glucose Blood) .... Check Blood Sugar Twice Daily 12)  Lexapro 10 Mg  Tabs (Escitalopram Oxalate) .... Once Daily 13)  Mirapex 0.5 Mg Tabs (Pramipexole Dihydrochloride) .[name].. 1 Once Daily For Restless Legs 14)  Flomax 0.4 Mg  Cp24 (Tamsulosin Hcl) .... Once Daily 15)  Nasonex 50 Mcg/act Susp (Mometasone Furoate) .... Two Sprays Once Daily 16)  Niaspan 1000  Mg Cr-Tabs (Niacin (Antihyperlipidemic)) .[name].. 1 Tab By Mouth Once Daily 17)  Sleep Therapy .... As Directed 18)  Meloxicam 7.5 Mg Tabs (Meloxicam) .[name].. 1 Tab By Mouth Once Daily As Needed Severe Pain With Food 19)  Onglyza 5 Mg Tabs (Saxagliptin Hcl) .[name].. 1 Tab By Mouth Once Daily 20)  Atrovent 0.03 % Soln (Ipratropium Bromide) .... Two Sprays Each Nostril Three Times A Day As  Needed  Current Medications (verified): 1)  Digitek 0.125 Mg Tabs (Digoxin) .... Once Daily 2)  Carvedilol 12.5 Mg Tabs (Carvedilol) .... Take One Tablet By Mouth Twice A Day 3)  Ramipril 5 Mg Caps (Ramipril) .... Take One Capsule By Mouth Daily 4)  Spironolactone 25 Mg  Tabs (Spironolactone) .... Once Daily 5)  Furosemide 40 Mg  Tabs (Furosemide) .... Once Daily 6)  Lipitor 40 Mg  Tabs (Atorvastatin Calcium) .... Once Daily 7)  Fish Oil   Oil (Fish Oil) .[name].. 1 Twice A Day 8)  Adult Aspirin Ec Low Strength 81 Mg  Tbec (Aspirin) .... Take 1 Tablet By Mouth Once A Day 9)  Glipizide 10 Mg  Tb24 (Glipizide) .... Two Times A Day 10)  Metformin Hcl 1000 Mg Tabs (Metformin Hcl) .... Take 1 By Mouth Two Times A Day For Diabetes 11)  Accu-Chek Comfort Curve   Strp (Glucose Blood) .... Check Blood Sugar Twice Daily 12)  Lexapro 10 Mg  Tabs (Escitalopram Oxalate) .... Once Daily 13)  Mirapex 0.5 Mg Tabs (Pramipexole Dihydrochloride) .[name].. 1 Once Daily For Restless Legs 14)  Flomax 0.4 Mg  Cp24 (Tamsulosin Hcl) .... Once Daily 15)  Nasonex 50 Mcg/act Susp (Mometasone Furoate) .... Two Sprays Once Daily 16)  Niaspan 1000 Mg Cr-Tabs (Niacin (Antihyperlipidemic)) .[name].. 1 Tab By Mouth Once Daily 17)  Sleep Therapy .... As Directed 18)  Meloxicam 7.5 Mg Tabs (Meloxicam) .[name].. 1 Tab By Mouth Once Daily As Needed Severe Pain With Food 19)  Onglyza 5 Mg Tabs (Saxagliptin Hcl) .[name].. 1 Tab By Mouth Once Daily 20)  Atrovent 0.03 % Soln (Ipratropium Bromide) .... Two Sprays Each Nostril Three Times A Day As Needed  Allergies (verified): No Known Drug Allergies   Preventive Screening-Counseling & Management  Alcohol-Tobacco     Smoking Status: current smokeless tobacco use     Cans of tobacco/week: ?  Caffeine-Diet-Exercise     Caffeine use/day: none     Does Patient Exercise: yes     Type of exercise: yard work  Paramedic Use: yes   Current Allergies (reviewed today): No  known allergies  Past History:  Past Medical History: Last updated: [id number] SYSTOLIC HEART FAILURE, CHRONIC (ICD-428.22) CARDIOMYOPATHY, ISCHEMIC (ICD-414.8) HYPERLIPIDEMIA (ICD-272.4) MYOCARDIAL INFARCTION, HX OF (ICD-412) HYPERTENSION (ICD-401.9) CEREBROVASCULAR DISEASE (ICD-437.9) CORONARY ARTERY BYPASS GRAFT, HX OF (ICD-V45.81) RESTLESS LEG SYNDROME (ICD-333.94) IMPLANTATION OF DEFIBRILLATOR,PRIMARY PREVENTION STJUDE (ICD-V45.02) VITAMIN D DEFICIENCY (ICD-268.9) HYPOTHYROIDISM (ICD-244.9) ALLERGIC RHINITIS (ICD-477.9) ANEMIA (ICD-285.9) DIABETES MELLITUS, TYPE II (ICD-250.00) OSA      - AHI 8.4, RDI 13.9 Cold agglutinins Lymphadenopathy  Past Surgical History: Last updated: 01/27/2009 Cataract extraction Cholecystectomy s/p  ICD -defibralllator s/p resection  left ventriculr apical aneursym   PROCEDURE:  Coronary artery bypass grafting x4 with left internal mammary to the diagonal coronary artery, reverse saphenous vein graft to the LAD, reverse saphenous vein graft to the circumflex, reverse saphenous vein graft to the distal right coronary artery, and 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 SURGEON:  Lilia Argue Servando Snare, M.D.  Family History: Last updated: 01/27/2009  Reviewed and noncontributory  Social History: Last updated: 01/27/2009  No tobacco or ETOH abuse.  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   Risk Factors: Caffeine Use: none (03/02/2010) Exercise: yes (03/02/2010)  Risk Factors: Smoking Status: current smokeless tobacco use (03/02/2010) Cans of tobacco/wk: ? (03/02/2010)  Family History: Reviewed history from 01/27/2009 and no changes required.  Reviewed and noncontributory  Social History: Reviewed history from 01/27/2009 and no changes required.  No tobacco or ETOH abuse.  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   Review of Systems  The  patient denies anorexia, fever, weight loss, weight gain, vision loss, decreased hearing, hoarseness, chest pain, syncope, dyspnea on exertion, peripheral edema, prolonged cough, headaches, hemoptysis, abdominal pain, melena, hematochezia, severe indigestion/heartburn, hematuria, incontinence, genital sores, muscle weakness, suspicious skin lesions, transient blindness, difficulty walking, depression, unusual weight change, abnormal bleeding, and enlarged lymph nodes.    Vital Signs:  Patient profile:   77 year old male Height:      73 inches (185.42 cm) Weight:      195.8 pounds (89 kg) BMI:     25.93 Temp:     99.5 degrees F (37.50 degrees C) oral Pulse rate:   65 / minute BP sitting:   143 / 66  (left arm)  Vitals Entered By: Myrtis Hopping CMA Deborra Medina) (March 02, 2010 3:58 PM) CC: follow-up visit, no problems, doing better  Is Patient Diabetic? Yes Did you bring your meter with you today? No Pain Assessment Patient in pain? no      Nutritional Status BMI of 25 - 29 = overweight Nutritional Status Detail appetite "good"  Have you ever been in a relationship where you felt threatened, hurt or afraid?No   Does patient need assistance? Functional Status Self care Ambulation Normal Comments no missed doses of meds per pt.   Physical Exam  General:  Well-developed,well-nourished,in no acute distress; alert,appropriate and cooperative throughout examination Head:  Normocephalic and atraumatic without obvious abnormalities.  Eyes:  PERRLA and EOMI.   Nose:  no external erythema and no nasal discharge.   Mouth:  pharynx pink and moist, no erythema, and no exudates.   Lungs:  normal respiratory effort, no crackles, and no wheezes.   Heart:  normal rate, regular rhythm, no murmur, no gallop, and no rub.   Abdomen:  soft, non-tender, normal bowel sounds, and no distention.   Neurologic:  alert & oriented X3, strength normal in all extremities, and gait normal.   Skin:  no  rashes.   Inguinal Nodes:  has bilateral inguinal lymphadenopathy Psych:  Oriented X3, memory intact for recent and remote, and dysphoric affect.     Impression & Recommendations:  Problem # 1:  ENLARGEMENT OF LYMPH NODES (ICD-785.6)  This seems to have resolved. If his oncologist does not wish to pursue repeat CT imaging, then I am also in agreemnet with this. Rtc prn  Orders: Est. Patient Level IV [id number])  Problem # 2:  WEIGHT LOSS (ICD-783.21)  appears stable  Orders: Est. Patient Level IV [id number])  Patient Instructions: 1)  rtc as needed 2)  Have a Happpy Holidays!

## 2010-04-26 NOTE — Consult Note (Signed)
Summary: [facility]   Imported By: Laural Benes 04/17/2010 10:08:01  _____________________________________________________________________  External Attachment:    Type:   Image     Comment:   External Document

## 2010-04-28 ENCOUNTER — Encounter: Payer: Self-pay | Admitting: Cardiology

## 2010-04-28 ENCOUNTER — Ambulatory Visit (INDEPENDENT_AMBULATORY_CARE_PROVIDER_SITE_OTHER): Payer: Medicare Other | Admitting: Cardiology

## 2010-04-28 DIAGNOSIS — I2589 Other forms of chronic ischemic heart disease: Secondary | ICD-10-CM

## 2010-04-28 DIAGNOSIS — I2510 Atherosclerotic heart disease of native coronary artery without angina pectoris: Secondary | ICD-10-CM

## 2010-05-04 NOTE — Assessment & Plan Note (Signed)
Summary: 3 month rov/sl   Referring Provider:  Dr. Virl Axe, Dr. Kirk Ruths Primary Provider:  Dr. Joni Fears   History of Present Illness: [name] is a pleasant gentleman who has a history of coronary artery disease, status post coronary bypassing graft, ischemic cardiomyopathy, CHF, and status post ICD.  His last echocardiogram was performed in Sept 2011.  At that time, the ejection fraction was 25% with mild aortic insufficiency and trivial mitral regurgitation.  His last Myoview in Sept 2011 showed an ejection fraction of 28% with a prior infarct, but there was no ischemia.  His last carotid Dopplers in Dec 2011 and showed a 60-79% right and 40-59% left carotid stenosis. Followup was recommended in six months. I last saw him in November of 2011. Since then, the patient has dyspnea with more extreme activities but not with routine activities. It is relieved with rest. It is not associated with chest pain. There is no orthopnea, PND or pedal edema. There is no syncope or palpitations. There is no exertional chest pain.   Current Medications (verified): 1)  Digitek 0.125 Mg Tabs (Digoxin) .... Once Daily 2)  Carvedilol 12.5 Mg Tabs (Carvedilol) .... Take One Tablet By Mouth Twice A Day 3)  Ramipril 5 Mg Caps (Ramipril) .... Take One Capsule By Mouth Daily 4)  Spironolactone 25 Mg  Tabs (Spironolactone) .... Once Daily 5)  Furosemide 40 Mg  Tabs (Furosemide) .... Once Daily 6)  Lipitor 40 Mg  Tabs (Atorvastatin Calcium) .... Once Daily 7)  Fish Oil   Oil (Fish Oil) .[name].. 1 Twice A Day 8)  Adult Aspirin Ec Low Strength 81 Mg  Tbec (Aspirin) .... Take 1 Tablet By Mouth Once A Day 9)  Glipizide 10 Mg  Tb24 (Glipizide) .... Two Times A Day 10)  Metformin Hcl 1000 Mg Tabs (Metformin Hcl) .... Take 1 By Mouth Two Times A Day For Diabetes 11)  Accu-Chek Comfort Curve   Strp (Glucose Blood) .... Check Blood Sugar Twice Daily 12)  Lexapro 10 Mg  Tabs (Escitalopram Oxalate) .... Once Daily 13)   Mirapex 0.5 Mg Tabs (Pramipexole Dihydrochloride) .[name].. 1 Once Daily For Restless Legs 14)  Flomax 0.4 Mg  Cp24 (Tamsulosin Hcl) .... Once Daily 15)  Nasonex 50 Mcg/act Susp (Mometasone Furoate) .... Two Sprays Once Daily 16)  Niaspan 1000 Mg Cr-Tabs (Niacin (Antihyperlipidemic)) .[name].. 1 Tab By Mouth Once Daily 17)  Sleep Therapy .... As Directed 18)  Meloxicam 7.5 Mg Tabs (Meloxicam) .[name].. 1 Tab By Mouth Once Daily As Needed Severe Pain With Food 19)  Onglyza 5 Mg Tabs (Saxagliptin Hcl) .[name].. 1 Tab By Mouth Once Daily 20)  Atrovent 0.03 % Soln (Ipratropium Bromide) .... Two Sprays Each Nostril Three Times A Day As Needed  Allergies: No Known Drug Allergies  Past History:  Past Medical History: Reviewed history from 01/17/2010 and no changes required. SYSTOLIC HEART FAILURE, CHRONIC (ICD-428.22) CARDIOMYOPATHY, ISCHEMIC (ICD-414.8) HYPERLIPIDEMIA (ICD-272.4) MYOCARDIAL INFARCTION, HX OF (ICD-412) HYPERTENSION (ICD-401.9) CEREBROVASCULAR DISEASE (ICD-437.9) CORONARY ARTERY BYPASS GRAFT, HX OF (ICD-V45.81) RESTLESS LEG SYNDROME (ICD-333.94) IMPLANTATION OF DEFIBRILLATOR,PRIMARY PREVENTION STJUDE (ICD-V45.02) VITAMIN D DEFICIENCY (ICD-268.9) HYPOTHYROIDISM (ICD-244.9) ALLERGIC RHINITIS (ICD-477.9) ANEMIA (ICD-285.9) DIABETES MELLITUS, TYPE II (ICD-250.00) OSA      - AHI 8.4, RDI 13.9 Cold agglutinins Lymphadenopathy  Past Surgical History: Reviewed history from 01/27/2009 and no changes required. 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 SURGEON:   Lilia Argue. Servando Snare, M.D.  Social History: Reviewed history from 01/27/2009 and no changes required.  No tobacco or ETOH abuse.  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   Review of Systems       no fevers or chills, productive cough, hemoptysis, dysphasia, odynophagia, melena, hematochezia, dysuria, hematuria, rash, seizure activity, orthopnea, PND, pedal edema, claudication. Remaining systems are negative.   Vital Signs:  Patient profile:   77 year old male Height:      73 inches Weight:      200 pounds Pulse rate:   68 / minute Resp:     14 per minute BP sitting:   119 / 57  (left arm)  Vitals Entered By: Burnett Kanaris (April 28, 2010 10:05 AM)  Physical Exam  General:  Well-developed well-nourished in no acute distress.  Skin is warm and dry.  HEENT is significant for recent resection of cancer from right side of face Neck is supple. No thyromegaly.  Chest is clear to auscultation with normal expansion.  Cardiovascular exam is regular rate and rhythm. 2/6 systolic ejection murmur. Abdominal exam nontender or distended. No masses palpated. Extremities show no edema. neuro grossly intact    EKG  Procedure date:  04/28/2010  Findings:      Sinus rhythm, first degree AV block, PVCs, left bundle branch block.   ICD Specifications Following MD:  Virl Axe, MD     ICD Vendor:  [facility]     ICD Model Number:  [id number]     ICD Serial Number:  [id number] ICD DOI:  03/08/2004     ICD Implanting MD:  Virl Axe, MD  Lead 1:    Location: RV     DOI: 03/08/2004     Model #: [id number]     Serial #: [id number]     Status: active  Indications::  ICM   ICD Follow Up ICD Dependent:  No      Episodes Coumadin:  No  Brady Parameters Mode VVI     Lower Rate Limit:  40      Tachy Zones VF:  240     VT:  200     VT1:  160     Impression & Recommendations:  Problem # 1:  CORONARY ATHEROSCLEROSIS NATIVE CORONARY ARTERY (ICD-414.01) Continued aspirin, beta blocker,  ACE inhibitor and statin. His updated medication list for this problem includes:    Carvedilol 12.5 Mg Tabs (Carvedilol) .[name]... Take one tablet by mouth twice a day    Ramipril 5 Mg Caps (Ramipril) .[name]... Take one capsule by mouth daily    Adult Aspirin Ec Low Strength 81 Mg Tbec (Aspirin) .[name]... Take 1 tablet by mouth once a day  Problem # 2:  SYSTOLIC HEART FAILURE, CHRONIC (ICD-428.22) Euvolemic on examination. Continue ACE inhibitor, beta blocker and present dose of diuretic. His updated medication list for this problem includes:    Digitek 0.125 Mg Tabs (Digoxin) ..... Once daily    Carvedilol 12.5 Mg Tabs (Carvedilol) .[name]... Take one tablet by mouth twice a day    Ramipril 5 Mg Caps (Ramipril) .[name]... Take one capsule by mouth daily    Spironolactone 25 Mg Tabs (Spironolactone) ..... Once  daily    Furosemide 40 Mg Tabs (Furosemide) ..... Once daily    Adult Aspirin Ec Low Strength 81 Mg Tbec (Aspirin) .[name]... Take 1 tablet by mouth once a day  Problem # 3:  CARDIOMYOPATHY, ISCHEMIC (ICD-414.8) Continue ACE inhibitor and beta blocker. His updated medication list for this problem includes:    Digitek 0.125 Mg Tabs (Digoxin) ..... Once daily    Carvedilol 12.5 Mg Tabs (Carvedilol) .[name]... Take one tablet by mouth twice a day    Ramipril 5 Mg Caps (Ramipril) .[name]... Take one capsule by mouth daily    Spironolactone 25 Mg Tabs (Spironolactone) ..... Once daily    Furosemide 40 Mg Tabs (Furosemide) ..... Once daily    Adult Aspirin Ec Low Strength 81 Mg Tbec (Aspirin) .[name]... Take 1 tablet by mouth once a day  Problem # 4:  HYPERLIPIDEMIA (ICD-272.4) Continue statin. His updated medication list for this problem includes:    Lipitor 40 Mg Tabs (Atorvastatin calcium) ..... Once daily    Niaspan 1000 Mg Cr-tabs (Niacin (antihyperlipidemic)) .[name]... 1 tab by mouth once daily  Problem # 5:  HYPERTENSION (ICD-401.9) Continue present medications. Blood pressure controlled. His updated medication list for  this problem includes:    Carvedilol 12.5 Mg Tabs (Carvedilol) .[name]... Take one tablet by mouth twice a day    Ramipril 5 Mg Caps (Ramipril) .[name]... Take one capsule by mouth daily    Spironolactone 25 Mg Tabs (Spironolactone) ..... Once daily    Furosemide 40 Mg Tabs (Furosemide) ..... Once daily    Adult Aspirin Ec Low Strength 81 Mg Tbec (Aspirin) .[name]... Take 1 tablet by mouth once a day  Problem # 6:  CEREBROVASCULAR DISEASE (ICD-437.9) Continue aspirin and statin. Followup carotid Dopplers June 2012.  Problem # 7:  IMPLANTATION OF [place] (ICD-V45.02) Management per electrophysiology.  Problem # 8:  DIABETES MELLITUS, TYPE II (ICD-250.00)  His updated medication list for this problem includes:    Ramipril 5 Mg Caps (Ramipril) .[name]... Take one capsule by mouth daily    Adult Aspirin Ec Low Strength 81 Mg Tbec (Aspirin) .[name]... Take 1 tablet by mouth once a day    Glipizide 10 Mg Tb24 (Glipizide) .[name]..[name] Two times a day    Metformin Hcl 1000 Mg Tabs (Metformin hcl) .[name]... Take 1 by mouth two times a day for diabetes    Onglyza 5 Mg Tabs (Saxagliptin hcl) .[name]... 1 tab by mouth once daily  Problem # 9:  HYPOTHYROIDISM (ICD-244.9)  Patient Instructions: 1)  Your physician wants you to follow-up in: [place] will receive a reminder letter in the mail two months in advance. If you don't receive a letter, please call our office to schedule the follow-up appointment.

## 2010-05-04 NOTE — Miscellaneous (Signed)
Summary: [name]   Imported By: Sallee Provencal 04/24/2010 14:41:11  _____________________________________________________________________  External Attachment:    Type:   Image     Comment:   External Document

## 2010-05-08 ENCOUNTER — Encounter (INDEPENDENT_AMBULATORY_CARE_PROVIDER_SITE_OTHER): Payer: Self-pay | Admitting: *Deleted

## 2010-05-16 NOTE — Consult Note (Signed)
Summary: [facility]   Imported By: Bonner Puna 05/09/2010 15:13:06  _____________________________________________________________________  External Attachment:    Type:   Image     Comment:   External Document

## 2010-05-16 NOTE — Progress Notes (Signed)
Summary: [facility]: Office Progress Note  [facility]: Office Progress Note   Imported By: Roddie Mc 05/10/2010 08:45:49  _____________________________________________________________________  External Attachment:    Type:   Image     Comment:   External Document

## 2010-05-16 NOTE — Cardiovascular Report (Signed)
Summary: Office Visit Remote   Office Visit Remote   Imported By: Sallee Provencal 05/12/2010 11:15:44  _____________________________________________________________________  External Attachment:    Type:   Image     Comment:   External Document

## 2010-05-16 NOTE — Letter (Signed)
Summary: Remote Device Check  [facility]  [id number] [place]   [place], [place] [address]   Phone: [phone]  Fax: [phone]     May 08, 2010 MRN: [id number]   [name] [address] [place], [place]  [address]   Dear Mr. Starace,   Your remote transmission was recieved and reviewed by your physician.  All diagnostics were within normal limits for you.  __X____Your next office visit is scheduled for:  May 2012 with Dr Caryl Comes. Please call our office to schedule an appointment.    Sincerely,  Shelly Bombard

## 2010-05-26 ENCOUNTER — Encounter: Payer: Self-pay | Admitting: Infectious Disease

## 2010-05-31 LAB — GLUCOSE, CAPILLARY
Glucose-Capillary: 164 mg/dL — ABNORMAL HIGH (ref 70–99)
Glucose-Capillary: 191 mg/dL — ABNORMAL HIGH (ref 70–99)
Glucose-Capillary: 217 mg/dL — ABNORMAL HIGH (ref 70–99)
Glucose-Capillary: 234 mg/dL — ABNORMAL HIGH (ref 70–99)
Glucose-Capillary: 235 mg/dL — ABNORMAL HIGH (ref 70–99)
Glucose-Capillary: 239 mg/dL — ABNORMAL HIGH (ref 70–99)
Glucose-Capillary: 261 mg/dL — ABNORMAL HIGH (ref 70–99)
Glucose-Capillary: 331 mg/dL — ABNORMAL HIGH (ref 70–99)
Glucose-Capillary: 65 mg/dL — ABNORMAL LOW (ref 70–99)

## 2010-05-31 LAB — DIFFERENTIAL
Basophils Relative: 0 % (ref 0–1)
Eosinophils Relative: 0 % (ref 0–5)
Lymphs Abs: 1.7 10*3/uL (ref 0.7–4.0)
Monocytes Absolute: 1.3 10*3/uL — ABNORMAL HIGH (ref 0.1–1.0)
Neutrophils Relative %: 82 % — ABNORMAL HIGH (ref 43–77)
Smear Review: ADEQUATE

## 2010-05-31 LAB — URINALYSIS, ROUTINE W REFLEX MICROSCOPIC
Ketones, ur: NEGATIVE mg/dL
Nitrite: NEGATIVE
Protein, ur: NEGATIVE mg/dL

## 2010-05-31 LAB — COMPREHENSIVE METABOLIC PANEL
AST: 42 U/L — ABNORMAL HIGH (ref 0–37)
Albumin: 2.3 g/dL — ABNORMAL LOW (ref 3.5–5.2)
BUN: 25 mg/dL — ABNORMAL HIGH (ref 6–23)
Calcium: 8.5 mg/dL (ref 8.4–10.5)
Creatinine, Ser: 1.16 mg/dL (ref 0.4–1.5)
GFR calc Af Amer: >60 mL/min (ref >60)
Total Protein: 5.8 g/dL — ABNORMAL LOW (ref 6.0–8.3)

## 2010-05-31 LAB — CBC
Hemoglobin: 11.4 g/dL — ABNORMAL LOW (ref 13.0–17.0)
Hemoglobin: 12.3 g/dL — ABNORMAL LOW (ref 13.0–17.0)
MCH: 30.9 pg (ref 26.0–34.0)
MCH: 33 pg (ref 26.0–34.0)
MCHC: 33.7 g/dL (ref 30.0–36.0)
MCHC: 34.9 g/dL (ref 30.0–36.0)
MCV: 94.8 fL (ref 78.0–100.0)
Platelets: 127 10*3/uL — ABNORMAL LOW (ref 150–400)
RBC: 3.45 MIL/uL — ABNORMAL LOW (ref 4.22–5.81)
RBC: 3.88 MIL/uL — ABNORMAL LOW (ref 4.22–5.81)

## 2010-05-31 LAB — IMMUNOFIXATION ELECTROPHORESIS
IgA: 324 mg/dL (ref 68–378)
IgG (Immunoglobin G), Serum: 1240 mg/dL (ref 694–1618)
IgM, Serum: 51 mg/dL — ABNORMAL LOW (ref 60–263)

## 2010-05-31 LAB — BASIC METABOLIC PANEL
BUN: 20 mg/dL (ref 6–23)
BUN: 23 mg/dL (ref 6–23)
CO2: 27 mEq/L (ref 19–32)
CO2: 27 mEq/L (ref 19–32)
CO2: 29 mEq/L (ref 19–32)
Calcium: 8.3 mg/dL — ABNORMAL LOW (ref 8.4–10.5)
Calcium: 8.6 mg/dL (ref 8.4–10.5)
Calcium: 8.6 mg/dL (ref 8.4–10.5)
Chloride: 97 mEq/L (ref 96–112)
Chloride: 99 mEq/L (ref 96–112)
Creatinine, Ser: 1.1 mg/dL (ref 0.4–1.5)
Creatinine, Ser: 1.14 mg/dL (ref 0.4–1.5)
GFR calc Af Amer: >60 mL/min (ref >60)
GFR calc Af Amer: >60 mL/min (ref >60)
GFR calc Af Amer: >60 mL/min (ref >60)
GFR calc non Af Amer: >60 mL/min (ref >60)
Glucose, Bld: 190 mg/dL — ABNORMAL HIGH (ref 70–99)
Glucose, Bld: 223 mg/dL — ABNORMAL HIGH (ref 70–99)
Glucose, Bld: 50 mg/dL — ABNORMAL LOW (ref 70–99)
Glucose, Bld: 77 mg/dL (ref 70–99)
Potassium: 4.6 mEq/L (ref 3.5–5.1)
Sodium: 131 mEq/L — ABNORMAL LOW (ref 135–145)

## 2010-05-31 LAB — HEPATITIS PANEL, ACUTE
HCV Ab: NEGATIVE
Hep A IgM: NEGATIVE
Hepatitis B Surface Ag: NEGATIVE

## 2010-05-31 LAB — PROTEIN ELECTROPHORESIS, SERUM
Albumin ELP: 41.3 % — ABNORMAL LOW (ref 55.8–66.1)
Alpha-1-Globulin: 8.5 % — ABNORMAL HIGH (ref 2.9–4.9)
Alpha-2-Globulin: 17.4 % — ABNORMAL HIGH (ref 7.1–11.8)
Gamma Globulin: 18.6 % (ref 11.1–18.8)

## 2010-05-31 LAB — QUANTIFERON TB GOLD ASSAY (BLOOD)
Interferon Gamma Release Assay: NEGATIVE
Mitogen Minus Nil Value: 1.95 IU/mL
TB Antigen Minus Nil Value: 0.04 IU/mL

## 2010-05-31 LAB — MYCOPLASMA PNEUMONIAE ANTIBODY, IGM: Mycoplasma pneumo IgM: 59 Units/mL (ref <770)

## 2010-05-31 LAB — HEPATIC FUNCTION PANEL
AST: 46 U/L — ABNORMAL HIGH (ref 0–37)
Albumin: 2.3 g/dL — ABNORMAL LOW (ref 3.5–5.2)
Bilirubin, Direct: 0.4 mg/dL — ABNORMAL HIGH (ref 0.0–0.3)
Total Bilirubin: 1 mg/dL (ref 0.3–1.2)

## 2010-05-31 LAB — HISTOPLASMA ANTIGEN, URINE

## 2010-05-31 LAB — IGA: IgA: 308 mg/dL (ref 68–378)

## 2010-05-31 LAB — IGG: IgG (Immunoglobin G), Serum: 1140 mg/dL (ref 694–1618)

## 2010-05-31 LAB — IGM: IgM, Serum: 48 mg/dL — ABNORMAL LOW (ref 60–263)

## 2010-05-31 LAB — RETICULOCYTES: RBC.: 3.95 MIL/uL — ABNORMAL LOW (ref 4.22–5.81)

## 2010-05-31 LAB — HIV ANTIBODY (ROUTINE TESTING W REFLEX): HIV: NONREACTIVE

## 2010-06-01 LAB — URINE CULTURE
Colony Count: NO GROWTH
Culture  Setup Time: 201110010045
Culture: NO GROWTH

## 2010-06-01 LAB — COMPREHENSIVE METABOLIC PANEL
ALT: 38 U/L (ref 0–53)
Alkaline Phosphatase: 60 U/L (ref 39–117)
Glucose, Bld: 183 mg/dL — ABNORMAL HIGH (ref 70–99)
Potassium: 4.9 mEq/L (ref 3.5–5.1)
Sodium: 131 mEq/L — ABNORMAL LOW (ref 135–145)
Total Protein: 6.6 g/dL (ref 6.0–8.3)

## 2010-06-01 LAB — PROTIME-INR
INR: 1.08 (ref 0.00–1.49)
Prothrombin Time: 14.2 seconds (ref 11.6–15.2)

## 2010-06-01 LAB — GLUCOSE, CAPILLARY
Glucose-Capillary: 122 mg/dL — ABNORMAL HIGH (ref 70–99)
Glucose-Capillary: 137 mg/dL — ABNORMAL HIGH (ref 70–99)
Glucose-Capillary: 178 mg/dL — ABNORMAL HIGH (ref 70–99)
Glucose-Capillary: 220 mg/dL — ABNORMAL HIGH (ref 70–99)
Glucose-Capillary: 95 mg/dL (ref 70–99)

## 2010-06-01 LAB — BASIC METABOLIC PANEL
BUN: 32 mg/dL — ABNORMAL HIGH (ref 6–23)
BUN: 22 mg/dL (ref 6–23)
CO2: 25 mEq/L (ref 19–32)
Chloride: 96 mEq/L (ref 96–112)
Chloride: 105 mEq/L (ref 96–112)
Creatinine, Ser: 1.44 mg/dL (ref 0.4–1.5)
Creatinine, Ser: 1.13 mg/dL (ref 0.4–1.5)
Glucose, Bld: 125 mg/dL — ABNORMAL HIGH (ref 70–99)
Glucose, Bld: 259 mg/dL — ABNORMAL HIGH (ref 70–99)
Potassium: 4.3 mEq/L (ref 3.5–5.1)

## 2010-06-01 LAB — DIFFERENTIAL
Basophils Absolute: 0 10*3/uL (ref 0.0–0.1)
Basophils Relative: 0 % (ref 0–1)
Basophils Relative: 0 % (ref 0–1)
Eosinophils Absolute: 0.1 10*3/uL (ref 0.0–0.7)
Eosinophils Relative: 0 % (ref 0–5)
Eosinophils Relative: 1 % (ref 0–5)
Lymphs Abs: 0.6 10*3/uL — ABNORMAL LOW (ref 0.7–4.0)
Monocytes Absolute: 1.5 10*3/uL — ABNORMAL HIGH (ref 0.1–1.0)
Monocytes Absolute: 0.8 10*3/uL (ref 0.1–1.0)

## 2010-06-01 LAB — URINALYSIS, ROUTINE W REFLEX MICROSCOPIC
Bilirubin Urine: NEGATIVE
Glucose, UA: NEGATIVE mg/dL
Hgb urine dipstick: NEGATIVE
Ketones, ur: 15 mg/dL — AB
Nitrite: NEGATIVE
Specific Gravity, Urine: 1.019 (ref 1.005–1.030)
pH: 5 (ref 5.0–8.0)

## 2010-06-01 LAB — CARDIAC PANEL(CRET KIN+CKTOT+MB+TROPI)
CK, MB: 2.4 ng/mL (ref 0.3–4.0)
CK, MB: 2.9 ng/mL (ref 0.3–4.0)
Relative Index: INVALID (ref 0.0–2.5)
Total CK: 49 U/L (ref 7–232)
Total CK: 74 U/L (ref 7–232)
Troponin I: 0.3 ng/mL — ABNORMAL HIGH (ref 0.00–0.06)

## 2010-06-01 LAB — TROPONIN I: Troponin I: 0.16 ng/mL — ABNORMAL HIGH (ref 0.00–0.06)

## 2010-06-01 LAB — CBC
HCT: 38.3 % — ABNORMAL LOW (ref 39.0–52.0)
Hemoglobin: 12.7 g/dL — ABNORMAL LOW (ref 13.0–17.0)
MCH: 30.7 pg (ref 26.0–34.0)
MCHC: 32.7 g/dL (ref 30.0–36.0)
MCV: 93.8 fL (ref 78.0–100.0)
Platelets: 162 10*3/uL (ref 150–400)
RDW: 14.2 % (ref 11.5–15.5)
WBC: 11.3 10*3/uL — ABNORMAL HIGH (ref 4.0–10.5)
WBC: 11.6 10*3/uL — ABNORMAL HIGH (ref 4.0–10.5)

## 2010-06-01 LAB — DIGOXIN LEVEL
Digoxin Level: 0.5 ng/mL — ABNORMAL LOW (ref 0.8–2.0)
Digoxin Level: 0.6 ng/mL — ABNORMAL LOW (ref 0.8–2.0)

## 2010-06-01 LAB — CULTURE, BLOOD (ROUTINE X 2)
Culture  Setup Time: 201110010423
Culture: NO GROWTH

## 2010-06-01 LAB — POCT CARDIAC MARKERS
CKMB, poc: <1 ng/mL — ABNORMAL LOW (ref 1.0–8.0)
Myoglobin, poc: 88.7 ng/mL (ref 12–200)
Troponin i, poc: <0.05 ng/mL (ref 0.00–0.09)
Troponin i, poc: <0.05 ng/mL (ref 0.00–0.09)

## 2010-06-01 LAB — TSH: TSH: 0.349 u[IU]/mL — ABNORMAL LOW (ref 0.350–4.500)

## 2010-06-01 LAB — BRAIN NATRIURETIC PEPTIDE: Pro B Natriuretic peptide (BNP): 956 pg/mL — ABNORMAL HIGH (ref 0.0–100.0)

## 2010-06-01 LAB — HEMOGLOBIN A1C: Mean Plasma Glucose: 174 mg/dL — ABNORMAL HIGH (ref <117)

## 2010-06-28 LAB — GLUCOSE, CAPILLARY
Glucose-Capillary: 188 mg/dL — ABNORMAL HIGH (ref 70–99)
Glucose-Capillary: 192 mg/dL — ABNORMAL HIGH (ref 70–99)

## 2010-07-21 ENCOUNTER — Encounter: Payer: Self-pay | Admitting: Internal Medicine

## 2010-07-25 ENCOUNTER — Ambulatory Visit (INDEPENDENT_AMBULATORY_CARE_PROVIDER_SITE_OTHER): Payer: Medicare Other | Admitting: Internal Medicine

## 2010-07-25 ENCOUNTER — Encounter: Payer: Self-pay | Admitting: Internal Medicine

## 2010-07-25 DIAGNOSIS — Z95810 Presence of automatic (implantable) cardiac defibrillator: Secondary | ICD-10-CM

## 2010-07-25 DIAGNOSIS — I255 Ischemic cardiomyopathy: Secondary | ICD-10-CM

## 2010-07-25 DIAGNOSIS — I5022 Chronic systolic (congestive) heart failure: Secondary | ICD-10-CM

## 2010-07-25 DIAGNOSIS — I2589 Other forms of chronic ischemic heart disease: Secondary | ICD-10-CM

## 2010-07-25 NOTE — Patient Instructions (Signed)
Your physician wants you to follow-up in: 1 year  You will receive a reminder letter in the mail two months in advance. If you don't receive a letter, please call our office to schedule the follow-up appointment.  Your physician recommends that you continue on your current medications as directed. Please refer to the Current Medication list given to you today.  

## 2010-07-25 NOTE — Progress Notes (Signed)
HPI  [name] is a 77 y.o. male seen in followup for an ICD implanted in the setting of ischemic heart disease prior bypass surgery and chronic systolic failure.    His last echocardiogram was performed in September 2011 when he presented with congestive heart failure. It demonstrated diffuse wall motion abnormalities and an ejection fraction of 20-25% His last Myoview in March of 2009 showed an ejection fraction of 25% with a prior anterior, apical, and inferoapical infarct, but there was no ischemia.     The patient denies SOB, chest pain,  or palpitations; he has had problems with intermittent peripheral edema However he does note significant daytime somnolence sleep disordered breathing.  Past Medical History  Diagnosis Date  . ARTHRITIS, HIP   . Autoimmune hemolytic anemias   . CEREBROVASCULAR DISEASE   . CORONARY ARTERY BYPASS GRAFT, HX OF   . CORONARY ATHEROSCLEROSIS NATIVE CORONARY ARTERY   . DIABETES MELLITUS, TYPE II   . Enlargement of lymph nodes   . HYPERLIPIDEMIA   . HYPERTENSION   . HYPOTHYROIDISM   . LESION, FACE   . Nocturia   . RESTLESS LEG SYNDROME   . UNSPECIFIED ANEMIA   . VITAMIN D DEFICIENCY   . WEIGHT LOSS     Past Surgical History  Procedure Date  . Cataract extraction   . Cholecystectomy   . Cardiac defibrillator placement   . Ventricular resection / repair aneurysm     left   . Cabg     Current Outpatient Prescriptions  Medication Sig Dispense Refill  . aspirin EC 81 MG EC tablet Take 81 mg by mouth daily.        [name] atorvastatin (LIPITOR) 40 MG tablet Take 40 mg by mouth daily.        . carvedilol (COREG) 12.5 MG tablet Take 12.5 mg by mouth 2 (two) times daily with a meal.        . digoxin (LANOXIN) 0.125 MG tablet Take 125 mcg by mouth daily.        [name] escitalopram (LEXAPRO) 10 MG tablet Take 10 mg by mouth daily.        . fish oil-omega-3 fatty acids 1000 MG capsule Take 2 g by mouth 2 (two) times daily.        . furosemide (LASIX)  40 MG tablet Take 40 mg by mouth daily.        [name] glipiZIDE (GLUCOTROL) 10 MG tablet Take 10 mg by mouth 2 (two) times daily before a meal.        . glucose blood (ACCU-CHEK COMFORT CURVE) test strip by Other route 2 (two) times daily. Use as instructed       . ipratropium (ATROVENT) 0.03 % nasal spray 2 sprays by Nasal route. 2 sprays into each nostril three times a day as needed       . metFORMIN (GLUCOPHAGE) 1000 MG tablet Take 1,000 mg by mouth 2 (two) times daily with a meal.        . niacin (NIASPAN) 1000 MG CR tablet Take 1,000 mg by mouth daily.        . pramipexole (MIRAPEX) 0.5 MG tablet Take 0.5 mg by mouth daily. For restless legs       . ramipril (ALTACE) 5 MG capsule Take 5 mg by mouth daily.        . saxagliptin HCl (ONGLYZA) 5 MG TABS tablet Take 5 mg by mouth daily.        [name] spironolactone (  ALDACTONE) 25 MG tablet Take 25 mg by mouth daily.        . Tamsulosin HCl (FLOMAX) 0.4 MG CAPS Take 0.4 mg by mouth daily.        . meloxicam (MOBIC) 7.5 MG tablet Take 7.5 mg by mouth daily. Take one tab by mouth once daily as needed for severe pain. Take with food       . mometasone (NASONEX) 50 MCG/ACT nasal spray 2 sprays by Nasal route daily.          No Known Allergies  Review of Systems negative except from HPI and PMH  Physical Exam Well developed and well nourished in no acute distress HENT normal x heaering aids E scleral and icterus clear Neck Supple JVP flat; carotids brisk and full Clear to ausculation Regular rate and rhythm, 2/6 murmur  Soft with active bowel sounds No clubbing cyanosis ; trace edema Alert and oriented, grossly normal motor and sensory function Skin Warm and Dry   Assessment and  Plan

## 2010-07-25 NOTE — Assessment & Plan Note (Signed)
Stable on the current medications;

## 2010-07-25 NOTE — Assessment & Plan Note (Signed)
Continue current meds 

## 2010-07-25 NOTE — Assessment & Plan Note (Signed)
The patient's device was interrogated.  The information was reviewed. No changes were made in the programming.    

## 2010-08-01 NOTE — Assessment & Plan Note (Signed)
[facility] OFFICE NOTE   [name], [name]                      MRN:          [id number]  DATE:07/22/2007                            DOB:          08/17/1928    [name] is seen in followup for an ICD implanted in the setting of  ischemic heart disease and chronic systolic failure.  He saw Dr.  Stanford Breed about a month ago, and because of more edema, spironolactone  was added.  The edema is better.  His shortness of breath is better as  well.   MEDICATIONS:  His medications are reviewed and are not changed, apart  from the intercurrent addition of the spironolactone apart from Dr.  Jacalyn Lefevre note from June 11, 2007.   PHYSICAL EXAMINATION:  VITAL SIGNS:  On examination today, his blood  pressure was better at 121/63.  His weight was 219, down 2 pounds.  His  pulse was 60.  NECK:  His neck veins were 7 cm.  LUNGS:  Clear.  HEART:  Heart sounds were regular.  EXTREMITIES:  Trace 1-2+ edema.   Interrogation of his Primera ICD demonstrated an R wave of 12,  impedance of 435, a threshold of 1 volt at 0.5.  Battery voltage 3.1.   IMPRESSION:  1. Ischemic heart disease with prior bypass grafting.  2. Congestive heart failure - chronic - systolic.  3. Status post implantable cardioverter-defibrillator for primary      prevention.   [name] is stable from a rhythm point of view.  As it relates to his  diuretics, we will plan to check a BMET today with the intercurrent  addition of the potassium-sparing diuretic.  I should note that his  potassium before was 4.3.   We will see him again in six months' time.     Deboraha Sprang, MD, [facility]  Electronically Signed    SCK/MedQ  DD: 07/22/2007  DT: 07/22/2007  Job #: [id number]

## 2010-08-01 NOTE — H&P (Signed)
[name], [name]               ACCOUNT NO.:  [id number]   MEDICAL RECORD NO.:  [id number]          PATIENT TYPE:  INP   LOCATION:  0104                         FACILITY:  [facility]   PHYSICIAN:  Valerie A. Asa Lente, MDDATE OF BIRTH:  Aug 27, 1928   DATE OF ADMISSION:  01/15/2008  DATE OF DISCHARGE:                              HISTORY & PHYSICAL   PRIMARY CARE PHYSICIAN:  Dr. Tempie Hoist.   CARDIOLOGIST:  Dr. Kirk Ruths.   ELECTROPHYSIOLOGIST:  Dr. Virl Axe.   CHIEF COMPLAINT:  Shortness of breath and diaphoresis.   HISTORY OF PRESENT ILLNESS:  [name] is a 77 year old white male with  history of CAD status post CABG and ischemic cardiomyopathy with chronic  systolic heart failure.  Patient presents to [facility] Emergency Room  with reports of onset of shortness of breath awaking him from sleep at  approximately 4 o'clock this morning.  Patient reported shortness of  breath associated with diaphoresis at which time he stepped outside for  some cool air.  Patient reports on admission eval that he has been  compliant with the Lasix aside from 1 missed dose on day prior to  admission, however, patient states he is not taking his spironolactone  prescribed by Dr. Kirk Ruths at last cardiology office visit because  he pees too much already.  Patient denies any recent chest pain,  increased dyspnea on exertion, weakness, or increase in lower extremity  edema.  Of note, patient with recent increase in emotional stressors in  the last 6 weeks as stepson in car accident and deceased just prior to  this admission with funeral being held on day of admission.  Due to  patient's inability to maintain O2 saturation on room air and evidence  of heart failure on chest x-ray, patient will be admitted for further  evaluation and treatment.   PAST MEDICAL HISTORY:  1. Ischemic cardiomyopathy with chronic systolic heart failure, left      ventricular ejection fraction 35% in  June 2008 per 2D echo.  2. CAD status post CABG.  3. ICD placement in December 2005 secondary to #1.  4. Hypertension.  5. Hyperlipidemia.  6. Type 2 diabetes.  7. Remote cholecystectomy.   ALLERGIES:  No known drug allergies.   MEDICATIONS:  Per the EMR, E-chart, and M-STAT review:  1. Coreg 25 mg p.o. b.i.d.  2. Altace 10 mg p.o. b.i.d.  3. Lasix 40 mg p.o. daily.  4. Potassium chloride 20 mEq p.o. b.i.d.  5. Digoxin 0.125 mg p.o. daily.  6. Lipitor 40 mg p.o. daily.  7. Niaspan 1 g p.o. daily.  8. Glipizide 10 mg p.o. b.i.d.  9. Fosamax 0.4 mg p.o. daily.  10.Vitamin C p.o. daily.  11.Aspirin 81 mg p.o. daily.  12.Lexapro 10 mg p.o. daily.  13.Fish oil p.o. daily.  14.Norvasc 5 mg p.o. daily.  15.Avandamet 06/998 two tabs p.o. daily.  16.Mirapex 0.25 mg p.o. daily.  17.Spironolactone 25 mg p.o. daily prescribed at last cardiologist      visit, however, patient not taking.   FAMILY HISTORY:  Reviewed and noncontributory to this  admit.   SOCIAL HISTORY:  No tobacco or ETOH abuse.  Patient is married.  He is a  retired Administrator.   REVIEW OF SYSTEMS:  GENERAL:  Patient denies fever or weakness.  EYES:  Patient denies blurred vision or diplopia.  EARS, NOSE, AND THROAT:  Patient's denies hearing loss, sore throat.  NECK:  Patient denies  stiffness or swelling.  CARDIOVASCULAR:  Patient denies chest pain,  palpitations.  Patient does report chronic pedal edema unchanged from  baseline.  RESPIRATORY:  Patient reports positive productive cough.  Positive shortness of breath.  GI:  Patient denies abdominal pain,  nausea, vomiting, diarrhea, melena, or hematochezia.  GENITOURINARY:  Patient denies dysuria or hematuria.  MUSCULOSKELETAL:  Patient denies  joint pain or swelling.  ENDOCRINE:  Patient denies polyuria,  polydipsia, polyphagia.  NEUROLOGIC:  Patient denies headache or any  dizziness.   PHYSICAL EXAM:  Blood pressure 115/76.  Heart rate 60.  Respirations 22.   Temp 97.0.  O2 sat is 85% on room air, 96% on 3 L of O2.  GENERAL:  This is an elderly white male, awake and alert, in no acute  distress.  HEAD:  Normocephalic, atraumatic.  EYES:  Extraocular movements are intact with no scleral icterus or  injection.  EARS, NOSE, AND THROAT:  Mucous membranes are moist with no  oropharyngeal lesions.  NECK:  Supple with no thyromegaly or adenopathy.  CHEST:  With symmetrical movement.  Nontender to palpation.  CARDIOVASCULAR:  S1-S2.  Regular rate and rhythm with no murmur, rub, or  gallop.  Two-plus pitting edema, bilateral lower extremities, distal to  knees.  RESPIRATORY:  Diminished throughout with bibasilar crackles.  No  increased work of breathing at rest.  No wheeze.  GI:  Abdomen is soft, nontender, and nondistended with positive bowel  sounds.  No appreciated masses or hepatosplenomegaly.  NEUROLOGICAL:  Patient moves all extremities x4 with 5/5 strength with  no deficits on exam.  PSYCH:  Patient is alert and oriented x3 with pleasant mood.   LABS AND X-RAY:  Lab work significant for a white cell count of 12.3  with absolute neutrophil count of 9.5, BNP 480.0, creatinine 0.78,  glucose 206.  Cardiac point of care enzymes are negative x1.  Digoxin  level 0.3.  Chest x-ray reveals cardiomegaly with interstitial edema  with early air space edema consistent with moderate CHF over atypical  pneumonia.  EKG reveals left bundle branch block with normal sinus  rhythm at 69 beats per minute.  No old EKGs for comparison.   IMPRESSION AND PLAN:  1. Acute hypoxic respiratory failure in setting of acute on chronic      systolic heart failure.  We will admit patient to telemetry for      diuresis by increasing home dose Lasix and place patient on      spironolactone as ordered previously by cardiology.  In addition,      we will continue other medications including beta-blocker, aspirin,      and statin.  Cardiac Myoview performed in March 2009  revealed large      apical and inferoapical infarct without any reversible ischemia.      At this time, we will check 2D echo to eval for any new wall motion      abnormalities and ejection fraction changes.  We will cycle cardiac      enzymes to rule out acute coronary syndrome.  Check TSH.  We will  wean patient off O2 as tolerated and monitor I's and O's closely.  2. Leukocytosis with productive cough.  Given patient's age and      physical exam, as well as chest x-ray changes, we will treat      patient empirically with Avelox.  Recheck CBC in a.m. to monitor      white blood cell trend.  3. Hypertension.  Stable.  Continue home medications.  4. Hyperlipidemia.  Continue home medications except fish oil.  We      will check FLP in a.m.  5. Type 2 diabetes.  Check A1c.  We will continue glipizide and      sliding scale insulin as inpatient.  We will hold Avandamet      secondary to problem #1 and acute hospitalization.  Avandia may      need to be held at time of discharge in setting of chronic systolic      heart failure.  However, we will review patient's A1c and      echocardiogram.  6. History of coronary artery disease status post coronary artery      bypass graft.  Again, patient with no complaints of chest pain at      time of admission, however, given risk factors we will cycle      cardiac enzymes x3 to monitor trend.      Patrici Ranks, NP      Jannifer Rodney. Asa Lente, MD  Electronically Signed    LE/MEDQ  D:  01/15/2008  T:  01/15/2008  Job:  [id number]   cc:   Shanna Cisco., MD  [address]  [place]  [place] [address]   Deboraha Sprang, MD, [place] [address]  [place] [address]   Brian S. Stanford Breed, MD, [place] [place]  [place] [address]

## 2010-08-01 NOTE — Assessment & Plan Note (Signed)
[place] OFFICE NOTE   GIBBS, STROUGH                      MRN:          [id number]  DATE:02/03/2008                            DOB:          01/24/1929    [name] is a pleasant gentleman who has a history of coronary artery  disease status post coronary artery bypass graft, ischemic  cardiomyopathy, chronic congestive heart failure, and status post ICD.  His step son was recently involved in a motor vehicle accident in  [place] and ultimately ended up dying from this.  He spent a  significant amount of time on the road to and from.  He did not take his  diuretics at times due to the fact that he would have to stop to  urinate.  This ultimately caused him to be admitted on January 15, 2008,  with an episode of congestive heart failure.  His medications were  resumed.  His cardiac markers were negative.  Note, he had a followup  echocardiogram as well performed on January 16, 2008.  The ejection  fraction was noted to be 20-25%.  There was a mild-to-moderate aortic  insufficiency and mild mitral regurgitation.  There is moderate left  atrial enlargement.  Also, of note, he did have laboratories checked  during that admission and his liver functions were normal other than a  minimally elevated bilirubin at 1.3.  His cholesterol was 94 with an LDL  of 39 and an HDL 37.  Since discharge and taken his medications, he is  doing well with no dyspnea on exertion, orthopnea, PND, pedal edema,  palpitations, presyncope, or syncope.  Note, he has not had chest pain.  Also of note that he did have a Myoview performed in March 2009.  At  that time, his ejection fraction was noted to be 25% and there was a  prior anterior, apical, and inferoapical infarct with no ischemia.  His  most recent carotid Dopplers in June 2009 showed a 40-59% bilateral  internal carotid artery stenosis.   MEDICATIONS:  1. Norvasc 5 mg  p.o. daily.  2. Spironolactone 25 mg p.o. daily.  3. Metformin 1000 mg p.o. b.i.d.  4. Coreg 25 mg p.o. b.i.d.  5. Altace 10 mg p.o. b.i.d.  6. Lasix 40 mg p.o. daily.  7. Potassium 2 mEq p.o. b.i.d.  8. Digoxin 0.25 mg p.o. daily.  9. Lipitor 40 mg p.o. daily.  10.Niaspan 1 g p.o. daily.  11.Glipizide 10 mg p.o. b.i.d.  12.Flomax.  13.Aspirin 81 mg p.o. daily.  14.Vitamin C.  15.Lexapro.  16.Fish oil.  17.Mirapex.   PHYSICAL EXAMINATION:  VITAL SIGNS:  A blood pressure of 112/56 and his  pulse is 60.  He weighs 215 pounds.  HEENT:  Normal.  NECK:  Supple.  CHEST:  Clear.  CARDIOVASCULAR:  Regular rhythm.  ABDOMEN:  No tenderness.  EXTREMITIES:  No edema.   DIAGNOSES:  1. Chronic systolic congestive heart failure - [name] has much      improved since discharge.  He will continue on his diuretics and I  instructed him on the compliance of this as well as the low-sodium      diet.  He will also continue on his ACE inhibitor as well as his      beta-blocker and digoxin.  I will check a BMET to follow his      potassium and renal function.  2. Coronary artery disease status post coronary artery bypass graft -      he has not had chest pain and his recent Myoview showed no      ischemia.  He will continue with medical therapy including his      aspirin, statin, beta-blocker, and ACE inhibitor.  3. Cerebrovascular disease - he will continue on his aspirin and      statin and he will need to follow up carotid Dopplers in June 2010.  4. History of implantable cardioverter-defibrillator.  5. Hypertension - his blood pressure is adequately controlled on his      present medications.  6. Hyperlipidemia - he will continue on his statin and his recent      lipids and liver were outstanding.  7. Diabetes mellitus.   I will see him back in 3 months.     [name] Bors Stanford Breed, MD, [facility]  Electronically Signed    BSC/MedQ  DD: 02/03/2008  DT: 02/04/2008  Job #: [id number]

## 2010-08-01 NOTE — Assessment & Plan Note (Signed)
[place] OFFICE NOTE   [name], [name]                      MRN:          [id number]  DATE:04/27/2008                            DOB:          1928/12/14    [name] is a pleasant gentleman who has a history of coronary artery  disease, status post coronary bypassing graft, ischemic cardiomyopathy,  CHF, and status post ICD.  His last echocardiogram was performed in  October 2009.  At that time, the ejection fraction was 20-25% with mild-  to-moderate aortic insufficiency and mild mitral regurgitation.  His  last Myoview in March of 2009 showed an ejection fraction of 25% with a  prior anterior, apical, and inferoapical infarct, but there was no  ischemia.  His last carotid Dopplers in June 2009 showed a 40-59%  bilateral stenosis.  Since I last saw him, he denies any dyspnea, chest  pain, palpitations, or syncope.  There is no pedal edema.   MEDICATIONS:  1. Altace 10 mg p.o. b.i.d.  2. Amlodipine 5 mg p.o. daily.  3. Lipitor 40 mg p.o. daily.  4. Niaspan 1 g p.o. daily.  5. Potassium 20 mEq p.o. daily.  6. Digoxin 0.25 mg p.o. daily.  7. Carvedilol 25 mg p.o. b.i.d.  8. Spironolactone 25 mg p.o. daily.  9. Lasix 40 mg p.o. daily.  10.Flomax 0.4 mg p.o. daily.  11.Lexapro 10 mg p.o. daily.  12.Metformin 1000 mg p.o. daily.  13.Glyburide 10 mg tablets 2 p.o. daily.  14.Fish oil,.  15.Vitamin C.  16.Aspirin 81 mg p.o. daily.   PHYSICAL EXAMINATION:  VITAL SIGNS:  Blood pressure of 100/59 and pulse  is 70.  HEENT:  Normal.  NECK:  Supple.  CHEST:  Clear.  CARDIOVASCULAR:  Regular rate.  ABDOMEN:  No tenderness  EXTREMITIES:  No edema.   Electrocardiogram shows sinus rhythm at a rate of 68.  There is left  anterior fascicular block.  There is left ventricular hypertrophy with  QRS widening.  There are no significant ST changes noted.   DIAGNOSES:  1. Coronary artery disease, status post  bypass graft/ischemic      cardiomyopathy - [name] is doing well from symptomatic      standpoint.  He will continue with his angiotensin-converting      enzyme inhibitor, beta blocker, digoxin, diuretics, and statin as      well as aspirin.  2. Chronic systolic congestive heart failure - he is euvolemic on      examination today.  He will continue on his present medications.  I      will check a BMET to follow his potassium and renal function.  3. Cerebrovascular disease - He will need followup carotid Dopplers in      June of this year.  4. History of implantable cardioverter-defibrillator.  5. Hypertension - his blood pressure is adequately controlled.  6. Hyperlipidemia - he will continue on his statin, and Niaspan.  We      will check lipids and liver to adjust as indicated.  7. Diabetes mellitus.   We will  see him back in 9 months.     Denice Bors Stanford Breed, MD, [facility]  Electronically Signed    BSC/MedQ  DD: 04/27/2008  DT: 04/28/2008  Job #: [id number]

## 2010-08-01 NOTE — Assessment & Plan Note (Signed)
[place] OFFICE NOTE   VICTORY, FEICKERT                      MRN:          [id number]  DATE:11/26/2006                            DOB:          08-10-1928    [name] is a pleasant gentleman who has a history of coronary disease  status post coronary bypassing graft, ischemic cardiomyopathy, status  post ICD, and congestive heart failure.  Since I last saw him he has  dyspnea with more extreme activities but not with routine activities.  There is no orthopnea, PND, palpitations, presyncope, syncope or chest  pain.  His ICD has not fired.  He occasionally has mild pedal edema.  Note he is now taking Lasix 40 mg p.o. b.i.d. 3 days a week and the  other days 40 mg a day.   His medications include:  1. Coreg 25 mg p.o. b.i.d.  2. Altace 10 mg p.o. b.i.d.  3. Lasix 40 mg p.o. daily.  4. Potassium 20 mEq p.o. b.i.d.  5. Digoxin 0.125 mg p.o. daily.  6. Lipitor 40 mg p.o. daily.  7. Niaspan 1 g p.o. daily.  8. Glipizide 10 mg p.o. b.i.d.  9. Flomax.  10.Vitamin C.  11.Aspirin 81 mg p.o. daily.  12.Lexapro 10 mg p.o. daily.  13.Omega-3 fish oil.  14.Mirapex.  15.He also takes Norvasc 5 mg p.o. daily.   PHYSICAL EXAMINATION:  Shows a blood pressure of 129/69 and his pulse is  58.  He weighs 216 pounds.  HEENT:  Normal.  NECK:  Supple.  CHEST:  Clear.  CARDIOVASCULAR:  Reveals a regular rate and rhythm.  There is a 1/6  systolic ejection murmur at the left sternal border.  ABDOMEN:  Benign.  EXTREMITIES:  Show trace edema.   His electrocardiogram shows a sinus rhythm at a rate of 58.  There is a  left anterior fascicular block.  There is left ventricular hypertrophy.   DIAGNOSES:  1. Chronic systolic congestive heart failure - Mr. Moure's symptoms      have improved since I saw him before.  His Actos has been      discontinued by Dr. Joni Fears.  We will continue with his present      medications.   I will check a BMET today to follow his potassium and      renal function.  He will continue on his Coreg, Altace, diuretic,      and digoxin.  I have instructed him on a low-sodium diet and to      minimize his fluids.  2. Coronary artery disease status post coronary artery bypassing graft      - he will continue on his aspirin, statin, beta blocker, and ACE      inhibitor.  3. Status post implantable cardioverter-defibrillator.  4. History of mild cerebrovascular disease - he will need followup      carotid Dopplers in June 2009.  5. Hypertension - his blood pressure is adequately controlled on his      present medications.  6. Hyperlipidemia - we will check lipids and liver today and  adjust as      indicated.  7. Diabetes mellitus - per Dr. Joni Fears.   I will see him back in 6 months.     Denice Bors Stanford Breed, MD, [facility]  Electronically Signed    BSC/MedQ  DD: 11/26/2006  DT: 11/26/2006  Job #: [id number]

## 2010-08-01 NOTE — Discharge Summary (Signed)
[name], [name]               ACCOUNT NO.:  [id number]   MEDICAL RECORD NO.:  [id number]          PATIENT TYPE:  INP   LOCATION:  [id number]                         [facility]ACILITY[facility]   PHYSICIAN:  Ricard Dillon, MD    DATE OF BIRTH:  06/09/1928   DATE OF ADMISSION:  01/15/2008  DATE OF DISCHARGE:  01/16/2008                               DISCHARGE SUMMARY   PRIMARY CARE PHYSICIAN:  Frankey Shown, M.D.   CARDIOLOGIST:  Denice Bors. Stanford Breed, MD, F.A.C.C.   DISCHARGE DIAGNOSES:  1. Acute hypoxic respiratory failure in setting of acute on chronic      systolic heart failure.  2. Leukocytosis with productive cough.   HISTORY OF PRESENT ILLNESS:  [name] is a 77 year old white male with  past medical history of ischemic cardiomyopathy with chronic systolic  heart failure, last documented left ventricular ejection fraction 35% in  June 2008.  Patient also with history of CAD, status post CABG, and  status post ICD placement in December of 2005.  Patient presented to  [facility] Emergency Room on day of admission with reports of onset of  shortness of breath, awakening him from sleep at approximately 4 o'clock  on morning of admission.  Patient reported shortness of breath was  associated with diaphoresis.  Upon further discussion with patient upon  admission evaluation, patient did report missing frequent doses of  diuretics over the past several weeks, as patient and wife have been in  and out of the hospital with patient's step-son, who was in an MVC.  Unfortunately, patient's step-son passed away and funeral was actually  held the day of this admission.  Also of note, patient reported that he  was not taking his spironolactone as prescribed by Dr. Stanford Breed at  previous office visit because he already pees too much and is wearing  out the bathroom door.  Patient denied any increased chest pain,  increased dyspnea on exertion, weakness or increase in lower extremity  edema.   Evaluation in the ER revealed mildly elevated BNP at 480 with  mild leukocytosis at 12.3.  Chest x-ray revealed interstitial edema with  early air space edema, consistent with moderate CHF.  Patient was  admitted at that time for further evaluation and treatment.   PAST MEDICAL HISTORY:  1. Ischemic cardiomyopathy with chronic systolic heart failure, left      ventricular ejection fraction 35% in June 2008, per 2D echo.  2. Coronary artery disease, status post CABG.  3. Implantable cardioverter defibrillator placement in December of      2005, secondary to problem #1.  4. Hypertension.  5. Hyperlipidemia.  6. Type 2 diabetes.  7. Remote cholecystectomy.   COURSE OF HOSPITALIZATION:  1. Acute hypoxic respiratory failure in setting of acute chronic      systolic heart failure.  As mentioned above, patient has missed      frequent doses of diuretic therapy over the past several weeks, due      to increased stressors in family and death of stepson.  In      addition, patient was not  taking his spironolactone, as prescribed      by cardiology, as he felt this was unnecessary.  Patient was      admitted from the emergency room and placed on telemetry unit.      Patient did not require IV diuresis.  However, home dose Lasix was      increased to b.i.d.  Patient diuresed well with just over a liter      output since arrival to floor.  Also of note, patient underwent      Myoview in March 2009, per cardiology, that revealed large apical      and inferoapical infarct, without reversible ischemia.  A 2D      echocardiogram was repeated during this admission.  However,      results are pending at this time and can be reviewed at followup      appointment.  Cardiac enzymes were cycled and negative times three.      TSH was within normal limits.  At this time, patient able to      maintain O2 saturation on room air and thought medically stable for      discharge home on continued beta blocker,  aspirin and statin      therapy.  2. In regards to patient's mild leukocytosis with productive cough at      time of admission, in addition to chest x-ray changes that could      not rule out atypical pneumonia, patient will be treated      empirically with seven days of Avelox therapy.  3. Type 2 diabetes.  A1c obtained during this admission, 7.1.  Of      note, the patient was on Avandamet at time of admission.  In sight      of problem #1, we will discontinue Avandamet at this time and      continue patient on metformin b.i.d.  Patient will need outpatient      followup with primary care physician in approximately one month to      assess efficacy of this medication change and need for addition of      any other medications.   DISCHARGE MEDICATIONS:  1. Avelox 400 mg p.o. daily until gone.  2. Metformin 1000 mg p.o. b.i.d.  Note this is a new medication.  3. Coreg 25 mg p.o. b.i.d.  4. Lasix 40 mg p.o. daily.  5. Potassium chloride 20 mEq p.o. b.i.d.  6. Digoxin 0.125 mg p.o. daily.  7. Ramipril 10 mg p.o. b.i.d.  8. Amlodipine 5 mg p.o. daily.  9. Glipizide 10 mg p.o. b.i.d.  10.Flomax 0.4 mg p.o. daily.  11.Lexapro 10 mg p.o. daily.  12.Niacin 1000 mg p.o. daily.  13.Mirapex 0.25 mg p.o. daily.  14.Aspirin 81 mg p.o. daily.   Patient instructed to discontinue Avandamet at this time.   DISPOSITION:  Patient felt medically stable for discharge home at this  time.  Patient was instructed to follow up with his cardiologist, Dr.  Kirk Ruths, on November 17 at 11:15 a.m., at which time 2D  echocardiogram results  can be reviewed.  In addition, patient instructed to follow up with his  primary care physician, Dr. Tempie Hoist, in one month after  hospital discharge to assess the need for further adjustment of diabetic  medications.  Patient to call office for this appointment.      Patrici Ranks, NP      Ricard Dillon, MD  Electronically Signed  LE/MEDQ  D:  01/16/2008  T:  01/16/2008  Job:  [id number]   cc:   Shanna Cisco., MD  [place] [address]   Brian S. Stanford Breed, MD, [place] [place]  [place] [address]

## 2010-08-01 NOTE — Assessment & Plan Note (Signed)
[place] OFFICE NOTE   [name], [name]                      MRN:          [id number]  DATE:09/13/2006                            DOB:          02-10-1929    [name] is a pleasant gentleman who I follow for coronary disease  status post coronary artery bypass graft, ischemic cardiomyopathy,  hypertension, and hyperlipidemia. While I was on vacation in early June,  the patient presented with increased shortness of breath and was seen by  Dr. Olevia Perches. A chest x-ray showed vascular congestion and small  effusions. He also had a BNP that was elevated at 1116. Note the patient  had forgotten to take his Lasix the day before. His symptoms improved  after his Lasix was reinitiated. Now he has dyspnea with more extreme  exertion but not with routine activities around the house. There is no  orthopnea or PND but occasionally there is mild pedal edema. There is no  chest pain, palpitations or syncope. Note Dr. Olevia Perches also started Imdur  30 mg p.o. daily. He also had a followup echocardiogram performed on  August 28, 2006 that showed an ejection fraction of 35% with diffuse LV  hypokinesis. There was mild aortic insufficiency and tricuspid  regurgitation. The left atrium is mild to moderately dilated.   CURRENT MEDICATIONS:  1. Coreg 25 mg p.o. b.i.d.  2. Altace 10 mg p.o. b.i.d.  3. Lasix 40 mg p.o. daily.  4. K-Dur 20 mEq p.o. b.i.d.  5. Digoxin 0.125 mg p.o. daily.  6. Lipitor 40 mg p.o. daily.  7. Niaspan 1 gram p.o. daily.  8. Glipizide 10 mg p.o. b.i.d.  9. Actos/__________ 15/850 b.i.d.  10.Flomax 0.4 mg p.o. daily.  11.Valium as needed.  12.Vitamin C.  13.Aspirin 81 mg p.o. daily.  14.Lexapro.  15.Omega 3.  16.Mirapex.  17.Imdur 30 mg p.o. daily.   PHYSICAL EXAMINATION:  VITAL SIGNS:  Blood pressure of 151/74 and his  pulse is 66.  HEENT:  Normal.  NECK:  Supple.  CHEST:  Clear.  CARDIOVASCULAR:  Reveals a regular rate and rhythm.  ABDOMEN:  Benign.  EXTREMITIES: Show trace ankle edema.   DIAGNOSES:  1. Chronic congestive heart failure - the patient's symptoms have      improved. I have asked him to followup with Dr. Joni Fears for      consideration of changing his Actos to a different medication that      does not cause fluid retention. He will continue his present dose      of Lasix and he can take an additional 40 mg p.o. twice weekly for      increased weight gain or shortness of breath. He will continue on      his beta blocker, ACE inhibitor and Digoxin. I have also instructed      him on a low sodium diet and to minimize his fluids.  2. Coronary artery disease status post coronary artery bypass graft -      he will continue on his aspirin, beta blocker, ACE inhibitor and  statin.  3. Status post ICD.  4. Diabetes mellitus - per Dr. Joni Fears.  5. Hypertension - his blood pressure is elevated today and this      certainly could contribute to his heart failure. I have asked him      to discontinue his Imdur as it is causing a headache and we will      begin Norvasc 5 mg p.o. daily. I could increase this in the future      as needed.  6. Hyperlipidemia - he will continue on his statin.  7. I will see him back in approximately 3 months.     Denice Bors Stanford Breed, MD, [facility]  Electronically Signed    BSC/MedQ  DD: 09/13/2006  DT: 09/13/2006  Job #: [id number]

## 2010-08-01 NOTE — Assessment & Plan Note (Signed)
[place] OFFICE NOTE   [name], [name]                      MRN:          [id number]  DATE:06/11/2007                            DOB:          November 02, 1928    [name] is a very pleasant gentleman who has a history of coronary  disease status post coronary bypassing graft, ischemic cardiomyopathy,  status post ICD, and chronic systolic congestive heart failure.  Since I  last saw him there is no increased dyspnea on exertion, orthopnea, PND,  palpitations, presyncope, syncope, chest pain or ICD discharges.  He has  noticed increased pedal edema over the past 1 month.  His medications  include Coreg 25 mg p.o. b.i.d., Altace 10 mg p.o. b.i.d., Lasix 40 mg  p.o. daily, potassium 20 mEq p.o. b.i.d., digoxin 0.5 mg daily, Lipitor  40 mg, Niaspan 1 gram daily, glipizide 10 mg p.o. b.i.d., Flomax 0.4 mg  p.o. daily, vitamin C, aspirin 81 mg daily, Lexapro 10 mg daily, fish  oil, Mirapex, Norvasc 5 mg daily and Avandamet 06/998 two p.o. daily.   PHYSICAL EXAM:  Today shows a blood pressure of 144/70 and pulse of 53.  Weighs 221 pounds.  HEENT:  Normal.  NECK:  Supple.  CHEST:  Clear.  CARDIOVASCULAR:  Regular rate.  ABDOMEN:  Exam shows no tenderness.  EXTREMITIES:  1+ ankle edema.   Electrocardiogram shows sinus rhythm at a rate of 55.  There is left  ventricle hypertrophy.  There is lateral T-wave inversion.   DIAGNOSES:  1. Chronic systolic congestive heart failure - [name] has mild      volume excess today.  I have asked him to begin spironolactone 25      mg p.o. daily in addition to his other medications.  He will also      continue on his ACE inhibitor, beta-blocker, Lasix and digoxin.  We      will check a BMET in 1 week to follow his past and renal function.      At that time I also check lipids and liver.  2. Coronary disease status post bypassing graft - we will schedule him      to have a  Myoview for stratification.  He will continue his      aspirin, statin, beta blocker and ACE inhibitor.  3. History of implantable cardioverter defibrillator - he is scheduled      see Dr. Caryl Comes in May.  4. History of mild cerebrovascular disease - he will need followup      carotid Dopplers in June.  5. Hypertension - his blood pressure is mildly elevated and we are      adding spironolactone.  6. Hyperlipidemia - he will continue on statin.  7. Diabetes mellitus.   We will see him back in 9 months.     Denice Bors Stanford Breed, MD, [facility]  Electronically Signed    BSC/MedQ  DD: 06/11/2007  DT: 06/11/2007  Job #: [id number]

## 2010-08-01 NOTE — Assessment & Plan Note (Signed)
[place] OFFICE NOTE   [name], [name]                      MRN:          [id number]  DATE:08/20/2006                            DOB:          1928-07-29    Primary care physician:  Frankey Shown, MD  Cardiologist:  Denice Bors. Crenshaw, MD   CLINICAL HISTORY:  [name] was seen today as an add-on because of  increased shortness of breath.  He has coronary disease and has previous  coronary bypass graft surgery and has an ischemic cardiomyopathy.  The  last evaluation of his LV function I saw was an echocardiogram in 2005,  which showed an ejection fraction of 20-30%.  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   This morning he woke up and developed symptoms of shortness of breath,  which lasted for a couple of hours.  This was a very unusual symptom for  him.  He has no associated chest pain or palpitations.  His symptoms  improved over the course of the day.  He had been to the eye doctor  yesterday and not taken his Lasix.  He did take his Lasix this morning  and got better after that.   PAST MEDICAL HISTORY:  1. Diabetes.  2. Hypertension.  3. Hyperlipidemia.   CURRENT MEDICATIONS:  Coreg, Altace, furosemide, potassium, digoxin,  Lipitor, Niaspan, glipizide, Actos, Flomax, diazepam, aspirin, Lexapro,  omega fish oil, and Mirapex.   PHYSICAL EXAMINATION:  VITAL SIGNS:  On examination, the blood pressure  is 151/81, pulse 68 and regular.  NECK:  There was no venous distention.  The carotid pulses were full  without bruits.  CHEST:  Clear.  CARDIAC:  Cardiac rhythm was regular.  I did not hear any murmurs or  gallops.  ABDOMEN:  Soft without hepatosplenomegaly.  EXTREMITIES:  1+ edema.  The pedal pulses were equal.   IMPRESSION:  1. Shortness of breath, probably related to congestive heart failure  with mild volume overload.  2. Ischemic cardiomyopathy with an ejection fraction of 20-30%.  3. Coronary artery disease, status post prior coronary bypass graft      surgery.  4. Status post implantable cardioverter-defibrillator, VVI back-up      implantable cardioverter-defibrillator placement.  5. Hypertension.   RECOMMENDATIONS:  We interrogated his ICD and he has had no episodes of  ventricular tachycardia.  We got an x-ray on him today which showed  cardiomegaly but no congestion.  We also got some laboratory studies  today which are pending, including a CBC, BMP and BNP.  We will plan to  have him take an extra Lasix today 40 mg a day and then resume his  regular 40 mg tomorrow.  We will give him Imdur 30 mg at night as  treatment for prevention, prophylaxis against congestive heart failure.  We will get a 2 D echo on him next week and arrange for follow-up with  Dr. Stanford Breed in a week.  If his blood pressure does not  come down, he  may need further treatment for that.  In reviewing his records, I do not  think we did an ECG today and if we cannot find one, we will need to  have him come in to make sure that that does not show any changes.     Bruce Alfonso Patten Olevia Perches, MD, [facility]  Electronically Signed    BRB/MedQ  DD: 08/20/2006  DT: 08/21/2006  Job #: [id number]

## 2010-08-02 ENCOUNTER — Ambulatory Visit (INDEPENDENT_AMBULATORY_CARE_PROVIDER_SITE_OTHER): Payer: Medicare Other | Admitting: Family Medicine

## 2010-08-02 ENCOUNTER — Encounter: Payer: Self-pay | Admitting: Family Medicine

## 2010-08-02 DIAGNOSIS — I10 Essential (primary) hypertension: Secondary | ICD-10-CM

## 2010-08-02 DIAGNOSIS — D649 Anemia, unspecified: Secondary | ICD-10-CM

## 2010-08-02 DIAGNOSIS — G2581 Restless legs syndrome: Secondary | ICD-10-CM

## 2010-08-02 DIAGNOSIS — E119 Type 2 diabetes mellitus without complications: Secondary | ICD-10-CM

## 2010-08-02 DIAGNOSIS — R3915 Urgency of urination: Secondary | ICD-10-CM

## 2010-08-02 DIAGNOSIS — E039 Hypothyroidism, unspecified: Secondary | ICD-10-CM

## 2010-08-02 DIAGNOSIS — N401 Enlarged prostate with lower urinary tract symptoms: Secondary | ICD-10-CM

## 2010-08-02 DIAGNOSIS — M19019 Primary osteoarthritis, unspecified shoulder: Secondary | ICD-10-CM

## 2010-08-02 DIAGNOSIS — E785 Hyperlipidemia, unspecified: Secondary | ICD-10-CM

## 2010-08-02 DIAGNOSIS — N138 Other obstructive and reflux uropathy: Secondary | ICD-10-CM

## 2010-08-02 LAB — PSA: PSA: 2.47 ng/mL (ref 0.10–4.00)

## 2010-08-02 LAB — CBC WITH DIFFERENTIAL/PLATELET
Basophils Relative: 0 % (ref 0.0–3.0)
Eosinophils Absolute: 0.3 10*3/uL (ref 0.0–0.7)
Lymphocytes Relative: 21.5 % (ref 12.0–46.0)
Monocytes Absolute: 0.8 10*3/uL (ref 0.1–1.0)
Monocytes Relative: 7 % (ref 3.0–12.0)
Neutro Abs: 8.4 10*3/uL — ABNORMAL HIGH (ref 1.4–7.7)
Platelets: 172 10*3/uL (ref 150.0–400.0)
RDW: 17.5 % — ABNORMAL HIGH (ref 11.5–14.6)

## 2010-08-02 LAB — TSH: TSH: 1.03 u[IU]/mL (ref 0.35–5.50)

## 2010-08-02 LAB — BASIC METABOLIC PANEL
BUN: 16 mg/dL (ref 6–23)
Creatinine, Ser: 1 mg/dL (ref 0.4–1.5)
GFR: 79.11 mL/min (ref >60.00)
Potassium: 4.5 mEq/L (ref 3.5–5.1)

## 2010-08-02 LAB — HEPATIC FUNCTION PANEL
Albumin: 3.2 g/dL — ABNORMAL LOW (ref 3.5–5.2)
Alkaline Phosphatase: 66 U/L (ref 39–117)

## 2010-08-02 LAB — HEMOGLOBIN A1C: Hgb A1c MFr Bld: 8 % — ABNORMAL HIGH (ref 4.6–6.5)

## 2010-08-03 LAB — VITAMIN D 25 HYDROXY (VIT D DEFICIENCY, FRACTURES): Vit D, 25-Hydroxy: 29 ng/mL — ABNORMAL LOW (ref 30–89)

## 2010-08-04 MED ORDER — VITAMIN D3 1.25 MG (50000 UT) PO CAPS: 1.0000 | ORAL_CAPSULE | ORAL | Status: DC

## 2010-08-04 NOTE — Assessment & Plan Note (Signed)
[place]                                 ON-CALL NOTE   SAISH, WEATHERWAX                     MRN:          [id number]  DATE:09/29/2006                            DOB:          [birth year]    PRIMARY CARDIOLOGIST:  Dr. Denice Bors. Crenshaw   The patient's wife called me this evening complaining of her husband  having marked ankle edema.  The patient states that he is normally  taking Lasix 40 mg once a day and potassium 20 mEq once a day.  The  patient is having no shortness of breath or chest pain or dizziness, but  she is noticing that his ankles are really starting to swell this  afternoon.  She states that he did have a salty breakfast actually,  eating some sausage and some sausage gravy but no other extra salt  today.  The patient is asymptomatic but she is concerned about the ankle  edema.   The patient has a history of ischemic cardiomyopathy with an EF of 35%  and he does have an AICD pacemaker and is also diabetic.  The patient  states that Dr. Stanford Breed had advised that he stop taking his Actos and  follow with his primary care physician for a new medication and he was  started on Glucophage along with his current dose of glipizide.   I had advised the patient's wife to have him take an additional 40 mg of  Lasix p.o. now and also an additional potassium.  He is to keep his feet  elevated as much as possible and not get up and walk around and has his  feet hang over a couch, usually to sit is a recliner or to keep his feet  up with an ottoman.  The patient's wife is to weigh him, give him the  Lasix and then in the morning weight him again and hopefully the ankle  edema will have been better and he would have lost some fluid.  I have  asked her to give Dr. Jacalyn Lefevre office a call in the morning to advise  him if they have to give him an additional dose of Lasix.  If the edema  is not any better, she is also to report that to Dr.  Jacalyn Lefevre nurse in  order to receive more instructions.  The wife has been advised that if  he becomes very short of breath, having chest discomfort or dizziness,  that she is to call EMS for transport to the emergency room secondary to  probable CHF exacerbation.     Phill Myron. Purcell Nails, NP  Electronically Signed    KML/MedQ  DD: 09/29/2006  DT: 09/30/2006  Job #: [id number]

## 2010-08-04 NOTE — Assessment & Plan Note (Signed)
[place]                                   ON-CALL NOTE   DREYDAN, SOTTO                      MRN:          [id number]  DATE:10/27/2005                            DOB:          Jun 12, 1928    PRIMARY CARDIOLOGIST:  Dr. Stanford Breed.   SUPERVISING CARDIOLOGIST:  Dr. Ron Parker.   SUMMARY OF HISTORY:  [name] calls on the morning of the 11th complaining  of chin numbness.  He states this has been going on for four days.  His  questions was that he did not know if this was related to his heart.  On  further review with Mr. Linde he has not had any problems with chest  discomfort, shortness of breath, syncope or palpitations, orthopnea or  edema.  He states that his chin numbness is not similar to the symptoms he  was having prior to his bypass surgery.  He denies any other neurological  difficulties such as speech problems, weakness or numbness on either side of  his body.   I reassured Mr. Bagent that I did not think that his chin numbness was  related to a cardiac issue.  However, if he wished to be seen the only way  for evaluation over the weekend would be to present to the emergency room  and the ER physician could evaluate him and contact us if he should have any  cardiac issues.  I also suggested that he call his primary care physician,  Dr. Joni Fears, for further direction.  Mr. Gorostieta also states and confirms  according to Dr. Jacalyn Lefevre notes, that he has had occasional numbness in  his left upper extremity.  He has not informed Dr. Joni Fears of this in the  past.  He denies any injuries and again denies any other deficits except for  numbness in his chin.  He is not having any left upper extremity numbness.  He is acceptable with speaking with Dr. Joni Fears first and he states that if  he begins to feel bad he will seek evaluation at the emergency room for  further direction.                                   Sharyl Nimrod, PA-C   EW/MedQ  DD:  10/27/2005  DT:  10/28/2005  Job #:  [id number]

## 2010-08-04 NOTE — Assessment & Plan Note (Signed)
[place]                                 ON-CALL NOTE   LEVENT, OLVER                      MRN:          [id number]  DATE:09/29/2006                            DOB:          11/13/1928    PRIMARY CARE PHYSICIAN:  Dr. Joni Fears   Mr. Baerga wife called in today stating that he has had a significant  swelling of his feet and ankles.  She reports that she has never seen it  this swollen in the past.  He does have some baseline swelling.  He  recently was seen by Dr. Stanford Breed, his cardiologist who recommended that  he be removed from Actos.  Dr. Joni Fears switched him from Actoplus Met  to regular metformin.  He has been taking it for about 3 days.  The wife  states that this evening she noted significant swelling of his ankles  and feet and she states that he is not complaining of shortness of  breath or dyspnea on exertion.  The patient does have a history of  coronary artery disease, status post CABG as well as having a  defibrillator.  He is currently on Lasix as well.   PLAN:  I advised wife to take her husband to the emergency department  given the significant swelling of the lower extremity.  Although he is  not complaining about shortness of breath, this could be early sign of  congestive heart failure.  Wife states that she will call Dr. Jacalyn Lefevre  office as well to get further advice.     Leone Haven, M.D.  Electronically Signed    LA/MedQ  DD: 09/29/2006  DT: 09/30/2006  Job #: [id number]   cc:   Shanna Cisco., MD

## 2010-08-04 NOTE — Assessment & Plan Note (Signed)
[place] OFFICE NOTE   JOHANSEN, GARY                      MRN:          [id number]  DATE:06/06/2006                            DOB:          09-29-1928    Mr. Rosco is a pleasant gentleman with a history of coronary disease  status post coronary artery bypass graft, ischemic cardiomyopathy,  hypertension, hyperlipidemia.  Since I last saw him, he is doing well.  There is no dyspnea, chest pain, palpitations or syncope.  He  occasionally has mild pedal edema.  Note, he is not taking his Lasix on  a routine basis, but only intermittently.  His medications include:  1. Coreg 25 mg  p.o. b.i.d.  2. Altace 10 mg p.o. b.i.d.  3. Lasix 40 mg p.o. daily.  4. Potassium 20 mEq p.o. b.i.d.  5. Digoxin 0.5 mg p.o. daily.  6. Lipitor 40 mg p.o.  daily.  7. Niaspan 1 g p.o. daily.  8. Glipizide 10 mg p.o. b.i.d.  9. Actos, Flomax, Valium as needed.  10.Vitamin C.  11.Aspirin 81 mg p.o. daily.  12.__________ 10 mg p.o. daily.  13.Fish oil.   His physical examination today shows a blood pressure of 138/82 and his  pulse is 65.  His neck is supple.  His chest is clear.  His  cardiovascular examination is regular.  His abdominal examination shows  no pulsatile masses and no bruits.  His extremities show no edema.  Electrocardiogram shows a sinus rhythm at a rate of 65.  There is a  first degree AV block.  There is left axis deviation and LVH.  There is  poor R wave progression consisting with his prior anterior infarct.   DIAGNOSES:  1. Coronary artery disease status post coronary artery bypass graft.  2. Ischemic cardiomyopathy.  3. Status post ICD.  4. Status post resection of left ventricular apical aneurysm.  5. Diabetes mellitus.  6. Hypertension.  7. Hyperlipidemia.   PLAN:  Mr. Battenfield is doing well from a symptomatic standpoint.  He will  follow up with the EP physicians for his ICD as scheduled in  June.  He  will need followup carotid Dopplers in June of 2009.  We will check  lipids and liver as well as a BMET today and adjust his medications as  indicated.  He will continue with the risk factor modification including  diet and exercise.  He does not smoke.  He will see me back in one year.     Denice Bors Stanford Breed, MD, [facility]  Electronically Signed    BSC/MedQ  DD: 06/06/2006  DT: 06/06/2006  Job #: [id number]   cc:   Shanna Cisco., MD

## 2010-08-04 NOTE — H&P (Signed)
NAMEHOLBERT, BENTLEY                           ACCOUNT NO.:  [id number]   MEDICAL RECORD NO.:  [id number]                   PATIENT TYPE:  INP   LOCATION:  2307                                 FACILITY:  [facility]   PHYSICIAN:  Kirk Ruths, M.D. [facility]            DATE OF BIRTH:  18-Jun-1928   DATE OF ADMISSION:  09/06/2003  DATE OF DISCHARGE:                                HISTORY & PHYSICAL   HISTORY OF PRESENT ILLNESS:  Mr. Giachetti is a 77 year old male with a past  medical history of diabetes mellitus, hypertension, and status post  cholecystectomy who we were asked to evaluate for neck pain.  He has no  prior cardiac history.  Yesterday morning, he developed upper posterior back  pain as well as throat pain.  It was described as an aching sensation  without radiation.  There was no associated nausea or vomiting, shortness of  breath, or diaphoresis.  The pain was initially not pleuritic.  The pain  persisted throughout the day and through the night it became pleuritic and  also increased with lying flat.  The pain persisted this morning and he went  to see Dr. Regis Bill.  His electrocardiogram was noted to be abnormal and he  was referred to the emergency room.  On arrival to the emergency room, he is  complaining of pleuritic-type pain.  His electrocardiogram reveals a septal  infarction with anterior ST elevation.   MEDICATIONS AT HOME:  1. Actos 30 mg p.o. daily.  2. Glipizide 10 mg p.o. b.i.d.  3. Lotrel 5/20 one p.o. daily.  4. Lexapro 10 mg p.o. q.h.s.  5. Maxzide 37.5/25 one p.o. daily.  6. Flomax.  7. Zyrtec.  8. Quinine.  9. Vitamins.  10.      Aspirin 81 mg p.o. daily.  11.      Cymbalta 30 mg.  12.      Mobic.  13.      Protonix.   He has no known drug allergies.   SOCIAL HISTORY:  He denies any tobacco or alcohol use.   FAMILY HISTORY:  Negative for coronary artery disease.   PAST MEDICAL HISTORY:  Significant for diabetes mellitus as well as  hypertension but he  denies hyperlipidemia.  He has had prior  cholecystectomy.  There is no other past medical history noted.   REVIEW OF SYSTEMS:  He denies any headaches or fevers or chills.  There is  no productive cough or hemoptysis.  There is no dysphagia, odynophagia,  melena, or hematochezia.  There is no dysuria or hematuria.  There is no  seizure activity.  There is no orthopnea, PND, or pedal edema.  The  remaining systems are negative.   PHYSICAL EXAMINATION:  VITAL SIGNS:  Today shows a blood pressure of 129/70.  His pulse is 85.  GENERAL:  He is well developed and well nourished in mild distress.  His  skin is  warm and dry.  He does not appear to be depressed and there is no  peripheral clubbing.  HEENT:  Unremarkable with no abnormalities.  NECK:  Supple with normal upstroke bilaterally and there are no bruits  noted.  There was no jugular venous distention and no thyromegaly.  CHEST:  Clear to auscultation, normal expansion.  CARDIOVASCULAR:  There is a regular rate and rhythm.  Normal S1, S2.  Heart  sounds are distant.  I could not appreciate murmurs, rubs, or gallops.  ABDOMEN:  No tenderness.  Positive bowel sounds.  No hepatosplenomegaly.  No  masses appreciated.  There is no abdominal bruit.  EXTREMITIES:  He has 2+ femoral pulses bilaterally and no bruits.  His  extremities showed no edema and I can palpate no cords.  He has 2+ dorsalis  pedis pulses bilaterally.  NEUROLOGIC:  Grossly intact.   His electrocardiogram shows a normal sinus rhythm with occasional PVC's,  septal myocardial infarction and ST elevation in the anterolateral  distribution.  A quick-look echocardiogram in the emergency room was  technically difficult but showed a septal wall motion abnormality and  minimal pericardial effusion.   His laboratories from the emergency room are pending other than an i-STAT at  the time of this dictation.  He has a hemoglobin and hematocrit of 15 and 45  and his potassium is  3.9.  His BUN is elevated at 27.  His creatinine is  1.4.   DIAGNOSES:  1. Recent myocardial infarction.  2. 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  3. Diabetes mellitus.  4. Hypertension.   PLAN:  Mr. Web presents with neck pain that is somewhat atypical but his  electrocardiogram is consistent with an anterior myocardial infarction with  a residual ST elevation.  It should be noted that his symptoms are greater  than 24 hours in duration and they are now pleuritic.  This is suggestive of  a recent myocardial infarction and now with a post myocardial infarction  pericarditis.  A quick-look echocardiogram in the emergency room revealed  only a minimal effusion.  I therefore feel we should treat with aspirin,  heparin, beta-blockade, and statin therapy.  The risks and benefits of  catheterization have been discussed with the patient and he agrees to  proceed.  We will check lipids, liver functions, and TSH.  We will need to  cycle enzymes and follow his CBG's.  We will make further recommendations  once his catheterization results are known.                                                Kirk Ruths, M.D. [facility]    BC/MEDQ  D:  09/06/2003  T:  09/07/2003  Job:  [id number]

## 2010-08-04 NOTE — Discharge Summary (Signed)
NAMEBINGHAM, PICCIOTTO               ACCOUNT NO.:  [id number]   MEDICAL RECORD NO.:  [id number]          PATIENT TYPE:  INP   LOCATION:  6523                         FACILITY:  [facility]   PHYSICIAN:  Deboraha Sprang, M.D.  DATE OF BIRTH:  11-29-1928   DATE OF ADMISSION:  03/08/2004  DATE OF DISCHARGE:  03/09/2004                                 DISCHARGE SUMMARY   DISCHARGE DIAGNOSES:  Discharging post procedure day #1, status post implant  of St. Jude ATLAS + VR cardioverter defibrillator.   SECONDARY DIAGNOSES:  1.  History of myocardial infarction with subsequent coronary artery bypass      surgery, September 13, 2003.  2.  Ischemic cardiomyopathy, ejection fraction 25%.  3.  Fatigue.  4.  Hypertension.  5.  Type 2 diabetes.  6.  Status post cholecystectomy.  7.  Benign prostatic hypertrophy.   PROCEDURE:  March 08, 2004 implantation of cardioverter defibrillator by  Dr. Virl Axe.  Defibrillator threshold testing less than equal to 15  joules.  The patient has had no complications post procedurally.  He had  interrogation on post procedure day #1 of this St. Jude device; all values  within normal limits.  Chest x-ray shows no evidence of pneumothorax and  that the lead is in appropriate position.  The patient discharged on the  following medications.  1.  Altace 5 mg twice daily.  2.  Enteric coated aspirin 81 mg daily.  3.  Coreg 12.5 mg twice daily.  4.  Lipitor 40 mg daily at bedtime.  5.  Lasix 40 mg daily.  6.  Glipizide 10 mg twice daily.  7.  Actos 30 mg daily.  8.  Flomax 0.4 mg daily.  9.  Coumadin as directed by primary care giver.  10. K-Dur 20 mEq twice daily.  11. Diazepam 5 mg at bedtime.  12. Zyrtec-D 5/120 b.i.d.   Mobility issues have been discussed with the patient.   DISCHARGE DIET:  Low, low cholesterol diabetic diet.   He is asked to keep his incision dry for the next week and restart showering  Wednesday, December 28.  He has followup in the  Coumadin clinic Wednesday  December 28.  He has ICD clinic at [facility] on Thursday, March 23, 2004 a 9 in the morning.  He will see Dr. Caryl Comes March 27 at 10:45.   BRIEF HISTORY:  Mr. Jointer is a 77 year old gentleman who has a history of  diabetes, hypertension.  He had a myocardial infarction which occurred out  of hospital in March and then presented with recurrent chest pain. He  underwent left heart catheterization which showed severe coronary artery  disease.  He also underwent bypass surgery.  He apparently had an apical  clot which the family says was removed surgically.  Ejection fraction at the  time of catheterization revealed 20% and repeat echocardiographic evaluation  about November 2005 shows ejection fraction still in the 20s.  The patient's  main symptoms are lack of energy and occasional peripheral edema.  He does  not have nocturnal dyspnea or orthopnea.  He has no history of syncope or  presyncope or palpitations.  Mr. Ormes has indication for implantable  cardioverter defibrillator for sudden death prevention as indicated by the  MADIT-II criteria.  Benefits and risks have been discussed with the patient  and he agrees to undergo the procedure.  This is scheduled for December 21.   HOSPITAL COURSE:  The patient presented electively December 21 and underwent  implantation of St. Jude cardioverter defibrillator the same day. He has had  no cardiac dysrhythmias or respiratory distress in the post implant period.  Discharged on the medications and followup.      GM/MEDQ  D:  05/18/2004  T:  05/18/2004  Job:  [id number]   cc:   Shanna Cisco., M.D.  [address] [place]  [place] [address]  Fax: [phone]

## 2010-08-04 NOTE — Op Note (Signed)
[name], LASPISA                           ACCOUNT NO.:  [id number]   MEDICAL RECORD NO.:  [id number]                   PATIENT TYPE:  INP   LOCATION:  2307                                 FACILITY:  [facility]   PHYSICIAN:  Finis Bud, M.D.              DATE OF BIRTH:  09-22-1928   DATE OF PROCEDURE:  DATE OF DISCHARGE:                                 OPERATIVE REPORT   PROCEDURE:  Transesophageal echocardiography.   ANESTHESIOLOGIST:  Finis Bud, M.D.   INDICATION:  [name] is a 77 year old male who presents today for  coronary artery bypass grafting by Dr. Percell Miller B. Gerhardt.  Transesophageal  echocardiography probe will be utilized for left ventricular function and  structure.   DESCRIPTION OF PROCEDURE:  The patient was brought to the holding area the  morning of surgery where radial artery and pulmonary artery lines were  placed under local anesthetic with sedation.  The patient was then taken to  the operating room for routine induction of anesthesia.  The trachea was  intubated.  Following intubation of the trachea, the TEE probe was passed  oropharyngeally into the stomach and then withdrawn for imaging of the  cardiac structures.   PRE-CARDIOPULMONARY BYPASS TRANSESOPHAGEAL ECHOCARDIOGRAM:  Left ventricle:  There was significant decrease in left ventricular systolic function.  There  was overall significant left ventricular hypokinesis.  There was akinesis of  the anterior wall.  There appeared to be a thrombus along the apical wall of  the ventricle, however, it was somewhat difficult to visualize.   Aortic valve:  The aortic valve was trileaflet in nature.  On Doppler  examination, there was a mild aortic regurgitant flow.   Mitral valve:  The mitral valve appeared to have adequate leaflet function.  There was 1 to 2+ regurgitant flow on Doppler examination.  Pulse wave  examination was not reversed.   Left atrium:  The left atrium appeared to be  clear of any masses.  The  interatrial septum appeared intact.   Right ventricle:  Overall and grossly within normal limits.  No significant  tricuspid regurgitant flow.   The patient was placed on cardiopulmonary bypass.  Coronary artery bypass  grafting was carried out, as well as removal of thrombus from the ventricle.  Deairing maneuvers were carried out.  The patient was rewarmed and separated  from cardiopulmonary bypass with the initial attempt.   POST-CARDIOPULMONARY BYPASS TRANSESOPHAGEAL ECHOCARDIOGRAM -- LIMITED  EXAMINATION:  Left ventricle:  In the early bypass period, the ventricle  revealed evidence of low volume but with replacement of volume, the  contractile pattern improved.  There was still significant depression of  left ventricular function.   The rest of the cardiac exam was as previously described with noted removal  of left ventricular thrombus.  The patient was returned to the intensive  care unit in stable condition.  Finis Bud, M.D.    CE/MEDQ  D:  09/13/2003  T:  09/14/2003  Job:  [id number]

## 2010-08-04 NOTE — Op Note (Signed)
[name], [name]                           ACCOUNT NO.:  [id number]   MEDICAL RECORD NO.:  [id number]                   PATIENT TYPE:  INP   LOCATION:  2307                                 FACILITY:  [facility]   PHYSICIAN:  Lilia Argue. Servando Snare, M.D.            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   PROCEDURE:  Coronary artery bypass grafting x4 with left internal mammary to  the diagonal coronary artery, reverse saphenous vein graft to the LAD,  reverse saphenous vein graft to the circumflex, reverse saphenous vein graft  to the distal right coronary artery, and resection and closure of anterior  apical LV aneurysm and removal of mural thrombus, and endovein harvest.   SURGEON:  Lilia Argue. Servando Snare, M.D.   FIRST ASSISTANT:  Valentina Gu. Roxy Manns, M.D.   SECOND ASSISTANT:  Mardene Celeste, N.P./RNFA   HISTORY:  The patient is a 77 year old male who presented with over 24 hours  of prolonged chest pain with shortness of breath and was admitted and  underwent urgent cardiac catheterization.  At the time of catheterization he  was noted to have a totally occluded LAD that filled by collaterals, a  moderate-sized diagonal with a 70-80% stenosis.  High-grade stenosis of the  circumflex and total occlusion of the right coronary artery.  In addition  the patient had depressed LV function and evidence of an apical clot.  The  patient was stabilized medically and placed on heparin and then underwent  surgery after a period of time for stabilization and while on heparin.  The  patient agreed to surgery and signed informed consent.   DESCRIPTION OF  PROCEDURE:  With Swan-Ganz and arterial line monitors in  place the patient underwent general endotracheal anesthesia without  incident.  The skin of the chest and legs was prepped with Betadine and  draped in the usual sterile manner.  Using the Guidant endovein harvesting  system the vein was harvested from the right leg and was of good quality and  caliber.  He was systemically heparinized and the ascending aorta and the  right atrium were cannulated.  On examination of the LV there was extensive  vascular adhesions to the pericardium from the anterior wall.  These were  dissected down and it became obvious that the patient has evidence by echo  and ventriculogram that the patient had a very definite full thickness area  of infarction involving the anterior apical wall, that appeared older than  one week, as it was a thickly healed scar with paradoxical movement with  contraction.  With the patient on cardiopulmonary bypass sites for  anastomosis were selected and dissected out of the epicardium.  The  patient's body temperature was cooled, and aortic cross-clamp was applied,  and 500 mL of cold blood potassium cardioplegia was administered with rapid  diastolic arrest of the heart.  Myocardial septal temperature is monitored  throughout the cross-clamp.   Attention was turned first to the distal right coronary artery which was  opened.  The posterior descending was a small vessel and not bypassable.  The distal right coronary artery was opened and admitted a 1.5 mm probe  using a running 7-0 Prolene a distal anastomosis was performed.  Attention  was then turned to the circumflex coronary artery, which was opened  admitting a 1.5 mm probe, and secondly a reverse saphenous vein graft was  anastomosed to the circumflex.  Attention was then turned to the LV  aneurysm.  The thinned full-thickness scar was opened anteriorly and carried  down to the base of the heart.  On examination it was  an old organized  thrombus at the base of the LV which was stripped away.  The anterior apical  akinetic segment was then plicated with #2 Ticron sutures that were felt  strips, and were reinforced with a running 3-0 Prolene.  Prior to complete  closure of this the heart was allowed to passively fill and de-air.   Attention was then turned to the diagonal coronary artery, which was opened  and using a running 8-0 Prolene a distal anastomosis was performed with the  left internal mammary to the diagonal coronary artery.  The small, totally  occluded LAD, which predominately supplied the area of scar was then opened  and admitted a 1 mm probe distally.  Using a segment of reverse saphenous  vein graft the distal anastomosis was performed.  The aortic cross-clamp was  removed.  The total cross-clamp time was 85 minutes.  The 16 gauge needle  was introduced into the left ventricular apex to further de-air the heart.  Partial occlusion clamp was placed on the ascending aorta.  Three-punch  aortotomies were performed and each of three vein grafts were anastomosed to  the ascending aorta.  The air was evacuated from the grafts.  Partial  occlusion clamp was removed.  On Elanone and Dopamine infusion the patient  was then ventilated and weaned from cardiopulmonary bypass without  difficulty.  He remained hemodynamically stable.  He was decannulated in the  usual fashion.  Protamine sulfate was administered with operative field  hemostatic.  Two atrial and two ventricular pacing wires were applied to  graft and markers were applied.  Left pleural tube and two mediastinal tubes  left in place.  The sternum was closed with #6 stainless steel wire.  Fascia  closed with interrupted 0 Vicryl and running 3-0 Vicryl and subcutaneous  tissue, and 4-0 subcuticular stitch in the skin edges.  Dry dressings were applied.  Sponge and needle count was reported as correct at completion of  the procedure.  The  patient tolerated the procedure without obvious  complication and was transferred to the surgical intensive care unit for  further postoperative care.                                               Lilia Argue Servando Snare, M.D.    Mcneil Sober  D:  09/14/2003  T:  09/15/2003  Job:  [id number]   cc:   Kirk Ruths, M.D. [facility]

## 2010-08-04 NOTE — Progress Notes (Signed)
  Subjective:    Patient ID: [name], male    DOB: March 17, 1929, 77 y.o.   MRN: [id number] This 77 year old white male diabetic is in to discuss his multiple medical problems as well as didn't have several lab studies and refill meds her medications he has recently seen Dr. Caryl Comes regarding his pacemaker defibrillator and relates he is doing fine cardiac wise his CBGs have been running 1:30 to 150 continues to have some arthritic pains especially in his right shoulder. Continues to have restless leg syndrome and states Mirapex control hypertension has been well-controlled blood pressure 102/62 he continues to have some hearing problem despite his hearing aidsHPI    Review of Systemssee history of present illness     Objective:   Physical Exam Th are palpable and no bruitsme difficulty hearing HEENT normal heart fall except for the hearing aids carotid arteries no bruits thyroid is nonpalpable Chest wall is rather large defibrillator pacemaker in the left upper chest CABG scar well healed Heart no evidence of cardiomegaly as a systolic precordial murmur Lungs clear to palpation percussion auscultation  no dullness no rales no wheeze andAbdomen liver spleen kidneys are nonpalpable well healed cholecystectomy scar Genitalia normal Prostate 1+ enlarged no nodules no tenderness Extremities negative Skin negative Neurological no positive findings        Assessment & Plan:  Assessment the patient is that he is doing rather well controlled on his multiple medical problemsCAD no change in treatment Restless leg syndrome continue Mirapex Diabetes mellitus type 2 continue same medication until we get results of hemoglobin A1c Arthritis continue Mobic 7.5 mg each day

## 2010-08-04 NOTE — Consult Note (Signed)
NAMEISSACHAR, HAWRYLUK                           ACCOUNT NO.:  [id number]   MEDICAL RECORD NO.:  [id number]                   PATIENT TYPE:  INP   LOCATION:  2307                                 FACILITY:  [facility]   PHYSICIAN:  Lilia Argue. Servando Snare, M.D.            DATE OF BIRTH:  05/01/1928   DATE OF CONSULTATION:  09/06/2003  DATE OF DISCHARGE:                                   CONSULTATION   PHYSICIANS:  Requesting physician:  Jenkins Rouge, M.D.  Followup cardiologist: Kirk Ruths, M.D.  Primary care physician:  Frankey Shown, M.D.   REASON FOR CONSULTATION:  Myocardial infarction.   HISTORY OF PRESENT ILLNESS:  The patient is a 77 year old male with known  diabetes who presented to the [facility] Room in March with  substernal chest pain.  He was discharged from the emergency room with  diagnosis of reflux.  The patient now presents to [facility] after  having greater than 24 hours of prolonged substernal chest pain along with  shortness of breath at rest and then becoming pleuritic in nature, worse  with deep breaths.  This caused him this morning to see Dr. __________ in  the office, and EKG showed evidence of septal myocardial infarction.  The  patient was referred to the emergency room and then ultimately underwent  cardiac catheterization late this afternoon and placement of intra-aortic  balloon pump.  He has no previous history of myocardial infarction.   Cardiac risk factors include hypertension, type 2 diabetes for 10 years.  He  checks his CBGs frequently, keeping his glucose between 110 and 120.  He is  a remote smoker, having quit more than 20 years ago.  He denies  hyperlipidemia.  Does have a positive family history of coronary artery  disease in his mother.  He has no previous history of stroke.  Denies  claudication, denies renal insufficiency.   PAST MEDICAL HISTORY:  1. Colonoscopy in March.  He reports that the night prior to this  procedure     when he was drinking bowel prep that he had substernal pain.  2. Cholecystectomy in 1973.  3. Lens implant.   SOCIAL HISTORY:  The patient is married, lives in [place], has six  children.  His wife has had a knee replacement.  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  He denies alcohol use.   MEDICATIONS AT TIME OF ADMISSION:  1. Actos 30 mg a day.  2. Glyburide 10 mg b.i.d.  3. Lotrel 5/20 daily.  4. Lexapro 10 mg q.h.s.  5. Maxzide 37.5/25 once a day.  6. Flomax 0.4 mg a day.  7. Zyrtec D once a day.  8. Quinine 300 mg q.h.s.  9. Baby aspirin 1 a day.  10.      Mobic 15 mg a day.  11.  Protonix 20 mg a day.  12.      Cymbalta 30 mg a day.   ALLERGIES:  Denies any allergies.   REVIEW OF SYSTEMS:  CARDIAC:  Positive for chest pain, exertional shortness  of breath, and increase in fatigue.  Denies syncope, presyncope, orthopnea,  resting shortness of breath, palpitations, lower extremity edema.  GENERAL:  Denies any constitutional symptoms.  RESPIRATORY:  Symptoms as noted above.  Denies hemoptysis. GASTROINTESTINAL:  Positive for constipation, recent  negative colonoscopy.  NEUROLOGIC:  Has some evidence of peripheral  neuropathy, has also had frost bite during the [place] war of the feet.  MUSCULOSKELETAL:  Left leg jerks at night, treated with quinine.  GU:  Nocturia x 3.  Denies any recent infection.  Denies any hematologic  abnormalities.  Known diabetes for at least 10 years.  Denies psychiatric  history.  Denies claudication.  HEENT:  He has had bilateral implants.   PHYSICAL EXAMINATION:  VITAL SIGNS:  Temperature 98, blood pressure 117/48,  pulse 81, respiratory rate 1, O2 saturation 92%.  Height 75 inches, weight  225 pounds.  GENERAL:  The patient is supine in bed with a intra-aortic balloon pump in  his right groin.  He is without chest pain and comfortable.  He is alert and   oriented, able to relate his history without difficulty.  HEENT:  Pupils equal, round, and reactive to light.  NECK:  Without carotid bruits or jugular venous distention.  LUNGS:  Clear bilaterally with exception of sounds of the intra-aortic  balloon pump.  He has no wheezing.  CARDIAC:  Regular rate and rhythm without murmur or gallop.  ABDOMEN:  Benign without palpable masses or tenderness.  EXTREMITIES:  He has 1+ DP and PT pulses bilaterally.  NEUROLOGIC:  Grossly intact.  SKIN:  He has no skin changes in the lower extremities.  VASCULAR:  Exam as noted above.   LABORATORY AND X-RAY DATA:  Laboratory findings:  White count 16,000,  hemoglobin 14, hematocrit 42, platelet count 233.  PT 13.3, INR 1.0, PTT 35.  Sodium 138, potassium 3.9, creatinine 1.3.   Cardiac catheterization was reviewed with Dr. Stanford Breed and Dr. Johnsie Cancel.  The  patient has approximately 20% ejection fraction.  PA pressure is 37/20 with  a wedge of 22.  Cardiac output 6.0.  He has severe anterior apical  hypokinesis with large apical clot.  Left main 10%.  LAD:  Small vessel,  totally occluded, fills by collaterals from the right.  Does not appear to  be a long, large LAD wrapping around the apex.  Circumflex:  There is a  large diagonal with a proximal 90 to 95% stenosis.  The circumflex has a 70%  mid lesion.  Right coronary artery:  Totally occluded, a small vessel with  right-to-right and left-to-right collaterals.   IMPRESSION:  A patient with probable out-of-hospital completed myocardial  infarction with significant hypokinesis and apical clot.  Currently the  patient has been stabilized with intra-aortic balloon pump and medications  with the overall situation waiting and giving recovery time for acute  myocardial infarction before proceeding with bypass surgery would be  indicated but with surgery anticipated during this admission.  At this point, I do not think emergency bypass would be in the best  interest of the  patient now that he has been stabilized and has significant left ventricular  dysfunction with apical clot.  I will continue to follow the patient with  you with  possible bypass surgery  in approximately 7 days.                                               Lilia Argue Servando Snare, M.D.    Mcneil Sober  D:  09/06/2003  T:  09/06/2003  Job:  [id number]   cc:   Jenkins Rouge, M.D.   Kirk Ruths, M.D. [facility]   Shanna Cisco., M.D.  [address] [place]  [place] [address]  Fax: [phone]

## 2010-08-04 NOTE — Discharge Summary (Signed)
[name], [name]                           ACCOUNT NO.:  [id number]   MEDICAL RECORD NO.:  [id number]                   PATIENT TYPE:  INP   LOCATION:  2018                                 FACILITY:  [facility]   PHYSICIAN:  Lilia Argue. Servando Snare, M.D.            DATE OF BIRTH:  April 03, 1928   DATE OF ADMISSION:  09/06/2003  DATE OF DISCHARGE:  09/20/2003                                 DISCHARGE SUMMARY   ADMITTING DIAGNOSIS:  Acute myocardial infarction.   PAST MEDICAL HISTORY:  1. Diabetes mellitus type 2 x10 years.  2. Hypertension.   SURGICAL HISTORY:  Cholecystectomy and cataract extraction.   ALLERGIES:  No known drug allergies.   DISCHARGE DIAGNOSES:  1. 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  2. Post chest tube removal, development of subcutaneous emphysema, treated     with chest tube, now resolved.   BRIEF HISTORY:  [name] is a 77 year old Caucasian man.  He presented to  the [facility] Emergency Room on June 20 after having greater than 24 hours of  prolonged substernal chest pain associated with shortness of breath.  He saw  Dr. Suzzette Righter in the office.  EKG there showed evidence of septal MI.  He was  referred to the emergency room and was evaluated by Dr. Kirk Ruths.  He  recommended admission to the hospital and plans for urgent cardiac  catheterization.   HOSPITAL COURSE:  September 06, 2003, [name] was admitted to [facility] in care of [facility].  He underwent  cardiac catheterization by Dr. Jenkins Rouge.  His ejection fraction was  estimated to be 20%.  He was noted to have a severe anterior apical  hypokinesis with a large apical clot.  He was also noted to have occlusions  of the left anterior descending artery as well as diagonal arterial disease.  His circumflex artery had a 70% mid-vessel lesion and the right coronary  artery  was 100% occluded at the mid vessel.  Intraarticular balloon pump was  placed at the conclusion of the catheterization.  Cardiac surgery  consultation was requested and he was evaluated by Dr. Servando Snare.  After  examination of the patient and review of the available records, including  the catheterization films reviewed with Drs. Crenshaw and Albertine Patricia, all were  in agreement that [name] was currently stable and emergency coronary  artery bypass grafting would probably be a higher risk than waiting for some  recovery after his MI.   [name] remained hemodynamically stable after his catheterization.  His  condition improved such that his intra aortic balloon pump was removed on  September 07, 2003.   Preoperative arterial evaluation was performed due to occluded duplex scan  which revealed no significant carotid artery disease.  His upper extremity  exam revealed normal Allen's  chest bilaterally.  His lower extremity exam  revealed ankle brachial indices .88 on the right, .98 on the left.   [name] remained stable while recovering from his MI.  On September 13, 2003,  he underwent following surgical procedure with Dr. Lanelle Bal.  1. Coronary artery bypass grafting x4.  Grafts placed at time of procedure:     Left internal mammary artery graft to the diagonal artery, saphenous vein     grafted to left interior descending artery, saphenous vein was grafted to     the circumflex artery, saphenous vein was grafted to the distal right     coronary artery.  Vein was harvested from the right thigh to the end of     vein harvesting, taped date to the lower leg with an open surgical     technique.  2. Procedure No. 2.  Evacuation of left ventricular neural thrombus and     resection and plication of left ventricular aneurysm.   [name] tolerated these procedures well, transferring in stable condition  to the SICU.  He remained hemodynamically stable in the immediate  perioperative period  and was extubated several hours after arrival in the  ICU.  He woke from anesthesia neurologically intact.  He initially required  only routine care in the Intensive Care Unit.  Postoperative day 1, his  mediastinal chest tubes were removed.  He subsequently developed significant  subcutaneous emphysema.  There were no air leaks noted from the left pleural  tube that remained.  On postoperative day 2, his subcutaneous emphysema  increased such that placement of a left anterior chest tube was required.  This was performed at the bedside without incident.  Over the next several  days, the subcutaneous emphysema resolved.  His pleural tube and anterior  chest tube were removed in sequential fashion on postoperative day 4 and  postoperative day 6.  Otherwise [name] continued to make very good  progress in recovery from his surgery.  He was transferred to unit 2000 on  postoperative day 3.  He began working with cardiac rehab services on  postoperative day 2.  Coumadin therapy was initiated postoperatively and his  INR has been rising slowly.  This morning, July 3, his INR is 1.9.  His goal  is 2-2.5.  Cardiology's plan is for reevaluation of left ventricular  function at about 12 weeks and if his ejection fraction is less than 30 he  may require an ICD.   This morning, August 20, 2003, postoperative day 6 on morning rounds, [name] reports feeling very well.  His vital signs are stable, with blood  pressure 134/81.  He is afebrile.  His room air saturation is 92%.  His  heart is maintaining a normal sinus rhythm. 78 PBM.  His lungs are clear to  auscultation bilaterally.  His chest x-ray remains stable.  His subcutaneous  emphysema is continuing to resolve.  He is tolerating his diet without  nausea.  His bowel and bladder functions are within normal limits for him. His incisions are all healing well.  He has no lower extremity edema.  He is  ambulating in the hall today independently,  his pain is adequately  controlled.  Chest x-ray following removal of his left chest tube revealed  no pneumothorax.   [name] diabetes was controlled in the immediate perioperative period  due to the recommended protocol.  He was switched to Lantus insulin  initially and then as his p.o.  intake increased he was restarted on his home  oral medications.  His CBGs have generally remained below 150 during his  postoperative period.   If [name] continues to progress as managed, anticipate he will be ready  for discharge home tomorrow, September 20, 2003.   LABORATORY DATA:  July 1 CBC, white blood cells 13.3, hemoglobin 8.9,  hematocrit 26.5, platelets 220.  July 2 chemistries included sodium 146,  potassium 3.6, BUN 20, creatinine 1.3, glucose 92.  July 3, PT 18.4, INR  1.9.   CONDITION ON DISCHARGE:  Improved.   DISCHARGE INSTRUCTIONS:  Medications:  1. Lopressor 25 mg b.i.d.  2. Altace 2.5 mg daily.  3. Lipitor 40 mg daily.  4. Lasix 40 mg daily x 1 week.  5. Potassium chloride 20 mEq daily for 1 week.  6. Coumadin, final dose to be determined at discharge.  He is to resume his home medications of Glipizide 10 mg t.i.d., Actos 30 mg  daily, Lexapro 10 mg daily, Flomax 0.4 mg daily, Protonix 20 mg daily.  Pain management:  He may have Ultram 50 mg 1-2 p.o. q.6 hours p.r.n. for moderate to severe  pain or Tylenol 325 mg 1-2 p.o. q.4 hours for mild pain.  Activity:  He should refrain from driving or any heavy lifting, pushing or pulling.  Also instructed to continue his breathing exercises and daily walking.  Diet:  Should continue to be a carbohydrate-modified medium calories diet.  Wound care:  He is to shower daily with mild soap and water.  If his incisions  show any  sign of infection he is to call Dr. Everrett Coombe office.   FOLLOW UP:  Home health services will be arranged for cardiac surgery  restorative protocol as well as for PT and INR blood work.  First draw to be   on Wednesday, July 6.  Results should be called to [facility].  He should be seen by Dr. Stanford Breed at the [facility] office in approximately 2 weeks.  If an appointment is not made  prior to discharge, the patient will be asked to call the office for an  appointment.  He will have a chest x-ray taken that day.   He has an appointment to see Dr. Cyndia Bent on Tuesday, July 26 at 12:15.  He  will be asked to bring his chest x-ray from Dr. Jacalyn Lefevre office for Dr.  Cyndia Bent to review.      Mardene Celeste, N.P.                  Lilia Argue Servando Snare, M.D.    CTK/MEDQ  D:  09/19/2003  T:  09/20/2003  Job:  [id number]   cc:   Kirk Ruths, M.D. [facility]   Shanna Cisco., M.D.  [address] [place]  [place] [address]  Fax: [phone]

## 2010-08-04 NOTE — Cardiovascular Report (Signed)
NAMESHERROD, [name]                           ACCOUNT [name].:  [id number]   [name].:  [id number]                   PATIENT TYPE:  INP   LOCATION:  2307                                 FACILITY:  [name]   PHYSICIAN:  [name], M.D.                  DATE OF BIRTH:  April 10, 1928   DATE OF PROCEDURE:  DATE OF DISCHARGE:                              CARDIAC CATHETERIZATION   PROCEDURE PERFORMED:  Coronary arteriography.   CARDIOLOGIST:  [name], M.D.   INDICATIONS:  [name] was done emergently.  He is a 77 year old patient  who suffered a myocardial infarction over the last 36 hours.  He was having  continuous chest pain.  He was sent to the emergency room by Dr. Regis [name].  Catheter was done as if the patient was an acute.   DESCRIPTION OF PROCEDURE:  We initially approached him from the right  femoral artery.  After the seriousness of his coronary disease was noted we  placed an intra-aortic balloon pump in the right femoral artery and a Swan  in the left femoral vein.   CORONARY ARTERIOGRAPHY:  Left Main Coronary Artery:  The left main coronary  artery had 20% discrete stenosis.   Left Anterior Descending Artery:  The left anterior descending artery was  100% occluded after the first septal perforator.  There were right-to-left  collaterals to a smallish LAD.  The first diagonal branch was a very large  artery and actually supplied the apex.  This is the artery that an internal  mammary graft should be placed.  There was a 95% ostial stenosis here.  The  second diagonal branch was small with 30-40% diffuse disease.   Circumflex Coronary Artery:  The circumflex coronary artery had a 70%  tubular midvessel lesion.  The second obtuse marginal branch had a 70%  tubular lesion.   Right Coronary Artery:  The right coronary artery was 100% occluded in its  midvessel.  There were right-to-right as well as left-to-right collaterals.  The right coronary artery also  supplied collaterals to the LAD.   VENTRICULOGRAPHIC DATA:  RAO Ventriculography:  RAO ventriculography showed  an EF of 25%.  The entire anterior wall apex and inferior apex were  akinetic.  There was a large roundish thrombus in the apex.   We called CVTS during the middle of the case since there was a chance of  having emergent bypass surgery.  The left subclavian was injected and the  LIMA was a good artery for bypass.  Because of the patient's ongoing chest  pain, three-vessel disease and decreased LV function intra-aortic balloon  pumping was initiated.   The 6 French coronary sheath was exchanged out for an 8.5 [place] intra-  aortic balloon pump sheath.  Fluoroscopically the tip of the intra-aortic  balloon was placed just distal to the left subclavian artery.  Balloon  inflation was excellent by fluoro.  Right heart catheterization was performed to help with hemodynamics.  The  patient was somewhat hypotensive in the lab and the cardiac surgeons wanted  an estimation of his cardiac index.   Mean right atrial pressure was 16.  RV pressure was 37/12.  PA pressure was  37/20.  Mean pulmonary capillary wedge pressure was 22.  The aortic pressure  was 105/68.  LV pressure was 101/21.  Fick cardiac output is pending.   IMPRESSION:  Dr. Servando [name], Dr. Stanford [name], Dr. Albertine [name] and I all reviewed the  films while in the catheterization laboratory.  We all agreed that he was  currently stable and that emergency coronary artery bypass graft would  probably a higher risk given tissue edema and the large clot in his  ventricle.   We will maintain his intra-aortic balloon pumping for 36-48 hours and  monitor his hemodynamics with a Swan-Ganz catheter.  He will be heparinized.  The patient will hopefully have an uneventful course in the next few days.  Coronary bypass grafting early next week would then be indicated.  Since the  LAD is collateralized and most of the infarction should have  been completed  by now, Dr. Albertine [name], myself and Dr. Stanford [name] all agreed that there was [name]  need currently for an intervention.  Dr. Albertine [name] actually felt that the LAD  had some aspects, which suggested chronic occlusion.   Again, I think that the internal mammary artery should go to the large  diagonal branch.   The patient left the lab hemodynamically stable with the balloon pump on 1:1  pumping.                                               [name], M.D.    PN/MEDQ  D:  09/06/2003  T:  09/07/2003  Job:  [id number]   cc:   [name]. [name], M.D. [facility]   [name], M.D. [name]

## 2010-08-04 NOTE — Patient Instructions (Signed)
Past allergies are doing very well however but may possibly not had your diabetes is well controlled as we like it will call results and will discuss treatment later [facility] call results of lab studies Reviewed her medications for 90 days

## 2010-08-04 NOTE — Op Note (Signed)
[name], [name]               ACCOUNT NO.:  [id number]   MEDICAL RECORD NO.:  [id number]          PATIENT TYPE:  INP   LOCATION:  6523                         FACILITY:  [facility]   PHYSICIAN:  Deboraha Sprang, M.D.  DATE OF BIRTH:  Mar 02, 1929   DATE OF PROCEDURE:  03/08/2004  DATE OF DISCHARGE:                                 OPERATIVE REPORT   PREOPERATIVE DIAGNOSES:  1.  Ischemic cardiomyopathy.  2.  Prior bypass surgery and depressed left ventricular function.   POSTOPERATIVE DIAGNOSES:  1.  Ischemic cardiomyopathy.  2.  Prior bypass surgery and depressed left ventricular function.   PROCEDURE:  Defibrillation implantation with intraoperative defibrillation  threshold testing.   CARDIOLOGIST:  Deboraha Sprang, M.D.   DESCRIPTION OF PROCEDURE:  Following the obtaining of an informed consent,  the patient was brought to the electrophysiological laboratory and placed on  the fluoroscopic table in the supine position.  After a routine prep and  drape of the left upper chest, lidocaine was infiltrated in the pre-pectoral  subpectoral groove, and an incision was made and carried down to the layer  of the pre-pectoral fascia using electrocautery and sharp dissection.  A  pocket was formed similarly.  Hemostasis was obtained.  Thereafter, attention was turned to gaining access to the extra-thoracic  left subclavian vein, which was accomplished without difficulty and without  the aspiration of air or puncture of the artery.  A single venipuncture was  accomplished.  A guide wire was placed and retained, and a #0 silk suture  was placed in a figure-of-eight fashion, and allowed to hang loosely.  Subsequently a 9-French tear-away introducer sheath was placed, through  which was then passed without the aspiration of air or puncture of the  artery.  The lead was a dual coil active fixation fibrillator lead model  #1581, serial [id number].  Under fluoroscopic guidance this was  manipulated  to the right ventricular apex where the bipolar R-wave was  20.9 mV, with a  pacing impedance of 512 ohms and a threshold of 0.7 V, at 0.5 msec.  The  currented threshold was 1.3 MA, and there was no diaphragmatic pacing at 10  V.  This lead was secured to the pre-pectoral fascia and then attached to a Kimble +VR ICD model #V193, serial [id number].  Through the device a  bipolar R-wave was 12 mV, with a pacing impedance of 460 and a threshold of  0.7 V, at 0.5 msec.  With these acceptable parameters recorded,  defibrillation threshold testing was undertaken.  Ventricular fibrillation  was induced through the T-wave shock.  After a total duration of 6 seconds,  a 15-joule shock was delivered through a measured resistance of 39 ohms,  terminating ventricular fibrillation and restoring sinus rhythm..  After a wait of 5 minutes, ventricular fibrillation was re-introduced via  the T-wave shock.  Again after a total duration of 6 seconds, a 15-joule  shock was delivered through a measured resistance of 39 ohms, terminating  ventricular fibrillation and restoring sinus rhythm.  With these acceptable  parameters recorded, the system  was implanted.  The pocket was copiously  irrigated with antibiotic-containing saline solution.  Hemostasis was  assured.  The leads in the pulse generator were placed in the pocket and  secured to the pre-pectoral fascia.  The wound was closed in three layers in  the normal fashion.  The wound was washed and dried, and Benzoin and Steri-  Strip dressing  was applied.  The needle count, sponge count and instrument count were  correct at the end of the procedure, according to the staff.  The patient tolerated the procedure without apparent complications.       SCK/MEDQ  D:  03/08/2004  T:  03/09/2004  Job:  [id number]   cc:   Electrophysiology Lab - [facility]   Kirk Ruths, M.D. [facility]

## 2010-08-10 ENCOUNTER — Encounter: Payer: Self-pay | Admitting: Family Medicine

## 2010-08-10 ENCOUNTER — Other Ambulatory Visit: Payer: Self-pay | Admitting: Family Medicine

## 2010-08-10 MED ORDER — METFORMIN HCL 1000 MG PO TABS: 1000.0000 mg | ORAL_TABLET | Freq: Two times a day (BID) | ORAL | Status: DC

## 2010-08-10 MED ORDER — IPRATROPIUM BROMIDE 0.03 % NA SOLN: 2.0000 | Freq: Three times a day (TID) | NASAL | Status: DC

## 2010-08-10 MED ORDER — ESCITALOPRAM OXALATE 10 MG PO TABS: 10.0000 mg | ORAL_TABLET | Freq: Every day | ORAL | Status: DC

## 2010-08-10 MED ORDER — GLIPIZIDE 10 MG PO TABS: 10.0000 mg | ORAL_TABLET | Freq: Two times a day (BID) | ORAL | Status: DC

## 2010-08-10 MED ORDER — PRAMIPEXOLE DIHYDROCHLORIDE 0.5 MG PO TABS: 0.5000 mg | ORAL_TABLET | Freq: Every day | ORAL | Status: DC

## 2010-08-10 MED ORDER — ATORVASTATIN CALCIUM 40 MG PO TABS: 40.0000 mg | ORAL_TABLET | Freq: Every day | ORAL | Status: DC

## 2010-08-10 MED ORDER — SAXAGLIPTIN HCL 5 MG PO TABS: 5.0000 mg | ORAL_TABLET | Freq: Every day | ORAL | Status: DC

## 2010-08-10 NOTE — Telephone Encounter (Signed)
Pt said that [facility] has not rcvd any scripts for the pt. Pls call all pts meds into [facility] mail order pharmacy asap.

## 2010-08-10 NOTE — Telephone Encounter (Signed)
rx sent to [facility] mail order and pt is aware

## 2010-08-17 ENCOUNTER — Other Ambulatory Visit (INDEPENDENT_AMBULATORY_CARE_PROVIDER_SITE_OTHER): Payer: Medicare Other

## 2010-08-17 DIAGNOSIS — K921 Melena: Secondary | ICD-10-CM

## 2010-08-17 LAB — HEMOCCULT GUIAC POC 1CARD (OFFICE)
Card #2 Fecal Occult Blod, POC: NEGATIVE
Card #3 Fecal Occult Blood, POC: NEGATIVE

## 2010-08-22 ENCOUNTER — Other Ambulatory Visit: Payer: Self-pay

## 2010-08-22 MED ORDER — TAMSULOSIN HCL 0.4 MG PO CAPS: 0.4000 mg | ORAL_CAPSULE | Freq: Every day | ORAL | Status: DC

## 2010-08-22 NOTE — Telephone Encounter (Signed)
Pt's wife called in and stated husband needed flomax rx sent to [facility]; pt is having trouble getting his rx from [facility].  Rx sent over and pt is aware

## 2010-08-24 ENCOUNTER — Encounter: Payer: Medicare Other | Admitting: *Deleted

## 2010-09-01 ENCOUNTER — Telehealth: Payer: Self-pay

## 2010-09-05 ENCOUNTER — Other Ambulatory Visit: Payer: Self-pay | Admitting: Cardiology

## 2010-09-05 DIAGNOSIS — I6529 Occlusion and stenosis of unspecified carotid artery: Secondary | ICD-10-CM

## 2010-09-06 ENCOUNTER — Encounter (INDEPENDENT_AMBULATORY_CARE_PROVIDER_SITE_OTHER): Payer: Medicare Other | Admitting: *Deleted

## 2010-09-06 ENCOUNTER — Encounter: Payer: Self-pay | Admitting: Cardiology

## 2010-09-06 DIAGNOSIS — I6529 Occlusion and stenosis of unspecified carotid artery: Secondary | ICD-10-CM

## 2010-09-06 NOTE — Telephone Encounter (Signed)
Pt is aware of lab results.

## 2010-10-19 ENCOUNTER — Encounter: Payer: Medicare Other | Admitting: *Deleted

## 2010-10-24 ENCOUNTER — Encounter: Payer: Self-pay | Admitting: *Deleted

## 2010-10-30 ENCOUNTER — Ambulatory Visit (INDEPENDENT_AMBULATORY_CARE_PROVIDER_SITE_OTHER): Payer: Medicare Other | Admitting: *Deleted

## 2010-10-30 DIAGNOSIS — Z95810 Presence of automatic (implantable) cardiac defibrillator: Secondary | ICD-10-CM

## 2010-10-30 DIAGNOSIS — I428 Other cardiomyopathies: Secondary | ICD-10-CM

## 2010-10-31 ENCOUNTER — Other Ambulatory Visit: Payer: Self-pay | Admitting: Internal Medicine

## 2010-10-31 ENCOUNTER — Encounter: Payer: Self-pay | Admitting: Internal Medicine

## 2010-10-31 LAB — REMOTE ICD DEVICE
BATTERY VOLTAGE: 2.55 V
BRDY-0002RV: 40 {beats}/min
RV LEAD AMPLITUDE: 12 mv
TZAT-0004SLOWVT: 8
TZAT-0012SLOWVT: 200 ms
TZAT-0013SLOWVT: 3
TZAT-0018FASTVT: NEGATIVE
TZAT-0018SLOWVT: NEGATIVE
TZAT-0019SLOWVT: 7.5 V
TZAT-0020FASTVT: 1 ms
TZAT-0020SLOWVT: 1 ms
TZON-0003FASTVT: 300 ms
TZON-0003SLOWVT: 375 ms
TZON-0004SLOWVT: 12
TZON-0005FASTVT: 6
TZON-0005SLOWVT: 6
TZON-0008FASTVT: 40 ms
TZON-0010FASTVT: 80 ms
TZST-0001FASTVT: 2
TZST-0001FASTVT: 4
TZST-0001FASTVT: 5
TZST-0001SLOWVT: 3
TZST-0001SLOWVT: 4
TZST-0003FASTVT: 36 J
TZST-0003FASTVT: 36 J
TZST-0003SLOWVT: 15 J
TZST-0003SLOWVT: 25 J
TZST-0003SLOWVT: 36 J
VENTRICULAR PACING ICD: 1 pct

## 2010-11-06 NOTE — Progress Notes (Signed)
icd remote check  

## 2010-11-13 ENCOUNTER — Telehealth: Payer: Self-pay | Admitting: Cardiology

## 2010-11-13 ENCOUNTER — Ambulatory Visit (INDEPENDENT_AMBULATORY_CARE_PROVIDER_SITE_OTHER): Payer: Medicare Other | Admitting: Cardiology

## 2010-11-13 ENCOUNTER — Encounter: Payer: Self-pay | Admitting: Cardiology

## 2010-11-13 DIAGNOSIS — I2510 Atherosclerotic heart disease of native coronary artery without angina pectoris: Secondary | ICD-10-CM | POA: Insufficient documentation

## 2010-11-13 DIAGNOSIS — I255 Ischemic cardiomyopathy: Secondary | ICD-10-CM

## 2010-11-13 DIAGNOSIS — Z95810 Presence of automatic (implantable) cardiac defibrillator: Secondary | ICD-10-CM

## 2010-11-13 DIAGNOSIS — I5022 Chronic systolic (congestive) heart failure: Secondary | ICD-10-CM

## 2010-11-13 DIAGNOSIS — I10 Essential (primary) hypertension: Secondary | ICD-10-CM

## 2010-11-13 DIAGNOSIS — I679 Cerebrovascular disease, unspecified: Secondary | ICD-10-CM

## 2010-11-13 DIAGNOSIS — E785 Hyperlipidemia, unspecified: Secondary | ICD-10-CM

## 2010-11-13 DIAGNOSIS — I2589 Other forms of chronic ischemic heart disease: Secondary | ICD-10-CM

## 2010-11-13 MED ORDER — SPIRONOLACTONE 25 MG PO TABS: 25.0000 mg | ORAL_TABLET | Freq: Every day | ORAL | Status: DC

## 2010-11-13 MED ORDER — DIGOXIN 125 MCG PO TABS: 125.0000 ug | ORAL_TABLET | Freq: Every day | ORAL | Status: DC

## 2010-11-13 MED ORDER — NIACIN ER (ANTIHYPERLIPIDEMIC) 1000 MG PO TBCR: 1000.0000 mg | EXTENDED_RELEASE_TABLET | Freq: Every day | ORAL | Status: DC

## 2010-11-13 MED ORDER — RAMIPRIL 5 MG PO CAPS: 5.0000 mg | ORAL_CAPSULE | Freq: Every day | ORAL | Status: DC

## 2010-11-13 MED ORDER — CARVEDILOL 12.5 MG PO TABS: 12.5000 mg | ORAL_TABLET | Freq: Two times a day (BID) | ORAL | Status: DC

## 2010-11-13 MED ORDER — FUROSEMIDE 40 MG PO TABS: 40.0000 mg | ORAL_TABLET | Freq: Every day | ORAL | Status: DC

## 2010-11-13 MED ORDER — ATORVASTATIN CALCIUM 40 MG PO TABS: 40.0000 mg | ORAL_TABLET | Freq: Every day | ORAL | Status: DC

## 2010-11-13 NOTE — Patient Instructions (Addendum)
Your physician recommends that you schedule a follow-up appointment in: [place]  Your physician recommends that you continue on your current medications as directed. Please refer to the Current Medication list given to you today.

## 2010-11-13 NOTE — Assessment & Plan Note (Signed)
Blood pressure controlled. Continue present medications. 

## 2010-11-13 NOTE — Progress Notes (Signed)
HPI:[name] is a pleasant gentleman who has a history of coronary artery disease, status post coronary bypassing graft, ischemic cardiomyopathy, CHF, and status post ICD.  His last echocardiogram was performed in Sept 2011.  At that time, the ejection fraction was 25% with mild aortic insufficiency and trivial mitral regurgitation.  His last Myoview in Sept 2011 showed an ejection fraction of 28% with a prior infarct, but there was no ischemia.  His last carotid Dopplers in June 2012 showed a 60-79% right and 40-59% left carotid stenosis. Followup was recommended in six months. I last saw him in Feb 2012. Since then, the patient has dyspnea with more extreme activities but not with routine activities. It is relieved with rest. It is not associated with chest pain. There is no orthopnea, PND or pedal edema. There is no syncope or palpitations. There is no exertional chest pain.   Current Outpatient Prescriptions  Medication Sig Dispense Refill  . aspirin EC 81 MG EC tablet Take 81 mg by mouth daily.        [name] atorvastatin (LIPITOR) 40 MG tablet Take 1 tablet (40 mg total) by mouth daily.  90 tablet  3  . carvedilol (COREG) 12.5 MG tablet Take 12.5 mg by mouth 2 (two) times daily with a meal.        . digoxin (LANOXIN) 0.125 MG tablet Take 125 mcg by mouth daily.        [name] escitalopram (LEXAPRO) 10 MG tablet Take 1 tablet (10 mg total) by mouth daily.  90 tablet  3  . fish oil-omega-3 fatty acids 1000 MG capsule Take 2 g by mouth 2 (two) times daily.        . furosemide (LASIX) 40 MG tablet Take 40 mg by mouth daily.        [name] glipiZIDE (GLUCOTROL) 10 MG tablet Take 1 tablet (10 mg total) by mouth 2 (two) times daily before a meal.  270 tablet  3  . glucose blood (ACCU-CHEK COMFORT CURVE) test strip by Other route 2 (two) times daily. Use as instructed       . meloxicam (MOBIC) 7.5 MG tablet Take 7.5 mg by mouth daily. Take one tab by mouth once daily as needed for severe pain. Take with food       .  metFORMIN (GLUCOPHAGE) 1000 MG tablet Take 1 tablet (1,000 mg total) by mouth 2 (two) times daily with a meal.  270 tablet  3  . mometasone (NASONEX) 50 MCG/ACT nasal spray 2 sprays by Nasal route daily.        . niacin (NIASPAN) 1000 MG CR tablet Take 1,000 mg by mouth daily.        . pramipexole (MIRAPEX) 0.5 MG tablet Take 1 tablet (0.5 mg total) by mouth daily. For restless legs  90 tablet  3  . ramipril (ALTACE) 5 MG capsule Take 5 mg by mouth daily.        . saxagliptin HCl (ONGLYZA) 5 MG TABS tablet Take 1 tablet (5 mg total) by mouth daily.  90 tablet  3  . spironolactone (ALDACTONE) 25 MG tablet Take 25 mg by mouth daily.        . Tamsulosin HCl (FLOMAX) 0.4 MG CAPS Take 1 capsule (0.4 mg total) by mouth daily.  90 capsule  3     Past Medical History  Diagnosis Date  . ARTHRITIS, HIP   . Autoimmune hemolytic anemias   . CEREBROVASCULAR DISEASE   . Ischemic cardiomyopathy  S/P CABG; EF 201-25% 11/2009  . ICD (implantable cardiac defibrillator) in place     [facility]--implanted 2005  . DIABETES MELLITUS, TYPE II   . Enlargement of lymph nodes   . HYPERLIPIDEMIA   . HYPERTENSION   . HYPOTHYROIDISM   . LESION, FACE   . Nocturia   . RESTLESS LEG SYNDROME   . UNSPECIFIED ANEMIA   . VITAMIN D DEFICIENCY   . WEIGHT LOSS     Past Surgical History  Procedure Date  . Cataract extraction   . Cholecystectomy   . Cardiac defibrillator placement   . Ventricular resection / repair aneurysm     left   . Cabg     History   Social History  . Marital Status: Married    Spouse Name: N/A    Number of Children: N/A  . Years of Education: N/A   Occupational History  . Not on file.   Social History Main Topics  . Smoking status: Former Research scientist (life sciences)  . Smokeless tobacco: Current User  . Alcohol Use: No  . Drug Use: No  . Sexually Active: No   Other Topics Concern  . Not on file   Social History Narrative  . No narrative on file    ROS: no fevers or chills, productive  cough, hemoptysis, dysphasia, odynophagia, melena, hematochezia, dysuria, hematuria, rash, seizure activity, orthopnea, PND, pedal edema, claudication. Remaining systems are negative.  Physical Exam: Well-developed well-nourished in no acute distress.  Skin is warm and dry.  HEENT is normal.  Neck is supple. No thyromegaly. Bilateral bruits Chest is clear to auscultation with normal expansion.  Cardiovascular exam is regular rate and rhythm. 2/6 systolic murmur Abdominal exam nontender or distended. No masses palpated. Extremities show no edema. neuro grossly intact  ECG sinus rhythm at a rate of 57. First degree AV block. Left bundle branch block.

## 2010-11-13 NOTE — Assessment & Plan Note (Signed)
Continue statin. 

## 2010-11-13 NOTE — Assessment & Plan Note (Signed)
Continue ACE inhibitor and beta blocker. 

## 2010-11-13 NOTE — Assessment & Plan Note (Signed)
Continue aspirin and statin. 

## 2010-11-13 NOTE — Assessment & Plan Note (Signed)
Continue aspirin and statin. Followup carotid Dopplers December 2012.

## 2010-11-13 NOTE — Telephone Encounter (Signed)
Spoke with pt, refills done as requested [name]

## 2010-11-13 NOTE — Telephone Encounter (Signed)
Pt has question re his meds pt would like to talk to debra.

## 2010-11-13 NOTE — Assessment & Plan Note (Signed)
Management per electrophysiology. 

## 2010-11-13 NOTE — Assessment & Plan Note (Signed)
Euvolemic on examination. Continue present dose of diuretic.

## 2010-11-27 NOTE — Progress Notes (Signed)
Addended by: Doug Sou D on: 11/27/2010 02:35 PM   Modules accepted: Orders

## 2010-12-01 ENCOUNTER — Telehealth: Payer: Self-pay | Admitting: *Deleted

## 2010-12-01 DIAGNOSIS — T82198A Other mechanical complication of other cardiac electronic device, initial encounter: Secondary | ICD-10-CM

## 2010-12-01 NOTE — Telephone Encounter (Signed)
Checking lead

## 2010-12-12 ENCOUNTER — Ambulatory Visit (INDEPENDENT_AMBULATORY_CARE_PROVIDER_SITE_OTHER)
Admission: RE | Admit: 2010-12-12 | Discharge: 2010-12-12 | Disposition: A | Discharge status: Home / Self Care | Payer: Medicare Other | Source: Ambulatory Visit | Attending: Internal Medicine | Admitting: Internal Medicine

## 2010-12-12 DIAGNOSIS — T82198A Other mechanical complication of other cardiac electronic device, initial encounter: Secondary | ICD-10-CM

## 2010-12-13 ENCOUNTER — Ambulatory Visit (INDEPENDENT_AMBULATORY_CARE_PROVIDER_SITE_OTHER): Payer: Medicare Other | Admitting: Pulmonary Disease

## 2010-12-13 ENCOUNTER — Encounter: Payer: Self-pay | Admitting: Pulmonary Disease

## 2010-12-13 VITALS — BP 122/64 | HR 58 | Temp 97.7°F | Ht 74.0 in | Wt 204.0 lb

## 2010-12-13 DIAGNOSIS — G4733 Obstructive sleep apnea (adult) (pediatric): Secondary | ICD-10-CM

## 2010-12-13 NOTE — Patient Instructions (Signed)
Will get report from CPAP machine and call with results Follow up in one year

## 2010-12-13 NOTE — Progress Notes (Signed)
Subjective:    Patient ID: [name], male    DOB: 09-29-1928, 77 y.o.   MRN: [id number]  HPI  77 yo male with OSA.  He goes to bed at 11 pm and wakes up at 7 am.  He wakes up once to use the bathroom.  He uses his CPAP every night.  He is not having problems with his mask, and uses a full face mask.  He feels rested during the day.  Past Medical History  Diagnosis Date  . ARTHRITIS, HIP   . Autoimmune hemolytic anemias   . CEREBROVASCULAR DISEASE   . Ischemic cardiomyopathy     S/P CABG; EF 201-25% 11/2009  . ICD (implantable cardiac defibrillator) in place     [employer]--implanted 2005  . DIABETES MELLITUS, TYPE II   . Enlargement of lymph nodes   . HYPERLIPIDEMIA   . HYPERTENSION   . HYPOTHYROIDISM   . LESION, FACE   . Nocturia   . RESTLESS LEG SYNDROME   . UNSPECIFIED ANEMIA   . VITAMIN D DEFICIENCY   . WEIGHT LOSS   . OBSTRUCTIVE SLEEP APNEA 07/19/2009    Family History  Problem Relation Age of Onset  . COPD Father     History   Social History  . Marital Status: Married   Occupational History  . Not on file.   Social History Main Topics  . Smoking status: Former Smoker -- 1.0 packs/day for 40 years    Types: Cigarettes    Quit date: 03/19/1978  . Smokeless tobacco: Current User  . Alcohol Use: No  . Drug Use: No  . Sexually Active: No   No Known Allergies   Review of Systems     Objective:   Physical Exam  BP 122/64  Pulse 58  Temp(Src) 97.7 F (36.5 C) (Oral)  Ht 6\' 2"  (1.88 m)  Wt 204 lb (92.534 kg)  BMI 26.19 kg/m2  SpO2 97%  General: normal appearance, healthy appearing, and obese.  Nose: clear nasal discharge.  Mouth: MP 3, no exudate  Neck: no JVD.  Lungs: diminished breath sounds, no wheezing or rales  Heart: regular rhythm and normal rate, 2/6 SM  Extremities: minimal ankle edema  Neurologic: normal CN II-XII and strength normal.  Cervical Nodes: no significant adenopathy  Psych: alert and cooperative; normal mood and  affect; normal attention span and concentration      Assessment & Plan:   OBSTRUCTIVE SLEEP APNEA He is doing well with CPAP.  He reports benefit and compliance with therapy.  Will get his download and call him with results.    Updated Medication List Outpatient Encounter Prescriptions as of 12/13/2010  Medication Sig Dispense Refill  . aspirin EC 81 MG EC tablet Take 81 mg by mouth daily.        [name] atorvastatin (LIPITOR) 40 MG tablet Take 1 tablet (40 mg total) by mouth daily.  90 tablet  3  . carvedilol (COREG) 12.5 MG tablet Take 1 tablet (12.5 mg total) by mouth 2 (two) times daily with a meal.  180 tablet  3  . digoxin (LANOXIN) 0.125 MG tablet Take 1 tablet (125 mcg total) by mouth daily.  90 tablet  3  . escitalopram (LEXAPRO) 10 MG tablet Take 1 tablet (10 mg total) by mouth daily.  90 tablet  3  . fish oil-omega-3 fatty acids 1000 MG capsule Take 2 g by mouth 2 (two) times daily.        . furosemide (  LASIX) 40 MG tablet Take 1 tablet (40 mg total) by mouth daily.  90 tablet  3  . glipiZIDE (GLUCOTROL) 10 MG tablet Take 1 tablet (10 mg total) by mouth 2 (two) times daily before a meal.  270 tablet  3  . glucose blood (ACCU-CHEK COMFORT CURVE) test strip by Other route 2 (two) times daily. Use as instructed       . meloxicam (MOBIC) 7.5 MG tablet Take 7.5 mg by mouth daily. Take one tab by mouth once daily as needed for severe pain. Take with food       . metFORMIN (GLUCOPHAGE) 1000 MG tablet Take 1 tablet (1,000 mg total) by mouth 2 (two) times daily with a meal.  270 tablet  3  . mometasone (NASONEX) 50 MCG/ACT nasal spray 2 sprays by Nasal route daily.        . niacin (NIASPAN) 1000 MG CR tablet Take 1 tablet (1,000 mg total) by mouth daily.  90 tablet  3  . ramipril (ALTACE) 5 MG capsule Take 1 capsule (5 mg total) by mouth daily.  90 capsule  3  . saxagliptin HCl (ONGLYZA) 5 MG TABS tablet Take 1 tablet (5 mg total) by mouth daily.  90 tablet  3  . spironolactone (ALDACTONE) 25  MG tablet Take 1 tablet (25 mg total) by mouth daily.  90 tablet  3  . Tamsulosin HCl (FLOMAX) 0.4 MG CAPS Take 1 capsule (0.4 mg total) by mouth daily.  90 capsule  3  . pramipexole (MIRAPEX) 0.5 MG tablet Take 1 tablet (0.5 mg total) by mouth daily. For restless legs  90 tablet  3

## 2010-12-13 NOTE — Assessment & Plan Note (Signed)
He is doing well with CPAP.  He reports benefit and compliance with therapy.  Will get his download and call him with results.

## 2010-12-19 LAB — BASIC METABOLIC PANEL
CO2: 33 — ABNORMAL HIGH
Chloride: 101
Chloride: 104
GFR calc Af Amer: >60
GFR calc Af Amer: >60
Potassium: 3.5
Sodium: 142
Sodium: 143

## 2010-12-19 LAB — GLUCOSE, CAPILLARY: Glucose-Capillary: 148 — ABNORMAL HIGH

## 2010-12-19 LAB — URINALYSIS, ROUTINE W REFLEX MICROSCOPIC
Glucose, UA: 100 — AB
Ketones, ur: NEGATIVE
Leukocytes, UA: NEGATIVE
pH: 6.5

## 2010-12-19 LAB — CARDIAC PANEL(CRET KIN+CKTOT+MB+TROPI)
CK, MB: 2
Relative Index: INVALID
Relative Index: INVALID
Total CK: 41
Total CK: 69
Troponin I: 0.04

## 2010-12-19 LAB — HEMOGLOBIN A1C: Mean Plasma Glucose: 157

## 2010-12-19 LAB — B-NATRIURETIC PEPTIDE (CONVERTED LAB): Pro B Natriuretic peptide (BNP): 480 — ABNORMAL HIGH

## 2010-12-19 LAB — DIGOXIN LEVEL: Digoxin Level: 0.3 — ABNORMAL LOW

## 2010-12-19 LAB — LIPID PANEL
LDL Cholesterol: 39
Triglycerides: 89

## 2010-12-19 LAB — CBC
Hemoglobin: 12.7 — ABNORMAL LOW
MCHC: 32.9
MCV: 95.8
RBC: 4.04 — ABNORMAL LOW
RBC: 4.75
WBC: 12.3 — ABNORMAL HIGH
WBC: 9.3

## 2010-12-19 LAB — TSH: TSH: 0.864

## 2010-12-19 LAB — POCT CARDIAC MARKERS
CKMB, poc: 1.3
Myoglobin, poc: 80.6
Troponin i, poc: <0.05

## 2010-12-19 LAB — DIFFERENTIAL
Eosinophils Absolute: 0.2
Eosinophils Relative: 2
Lymphs Abs: 1.8

## 2010-12-19 LAB — COMPREHENSIVE METABOLIC PANEL
ALT: 22
AST: 27
CO2: 26
Calcium: 9.3
Chloride: 106
GFR calc Af Amer: >60
GFR calc non Af Amer: >60
Potassium: 4.2
Sodium: 141

## 2010-12-19 LAB — URINE MICROSCOPIC-ADD ON

## 2011-01-12 ENCOUNTER — Telehealth: Payer: Self-pay | Admitting: Family Medicine

## 2011-01-12 DIAGNOSIS — E119 Type 2 diabetes mellitus without complications: Secondary | ICD-10-CM

## 2011-01-12 MED ORDER — GLUCOSE BLOOD VI STRP: ORAL_STRIP | Status: DC

## 2011-01-12 NOTE — Telephone Encounter (Signed)
Pt called and is req refill glucose blood (ACCU-CHEK COMFORT CURVE) test strip to [facility] on [address] and [address].

## 2011-01-12 NOTE — Telephone Encounter (Signed)
Pls disregard previous note. Pt says that he already has Accu-chek test strips.

## 2011-01-12 NOTE — Telephone Encounter (Signed)
rx sent into pharmacy

## 2011-01-17 ENCOUNTER — Encounter: Payer: Self-pay | Admitting: Family Medicine

## 2011-01-17 ENCOUNTER — Ambulatory Visit (INDEPENDENT_AMBULATORY_CARE_PROVIDER_SITE_OTHER): Payer: Medicare Other | Admitting: Family Medicine

## 2011-01-17 VITALS — BP 120/70 | Temp 97.5°F | Wt 205.0 lb

## 2011-01-17 DIAGNOSIS — Z23 Encounter for immunization: Secondary | ICD-10-CM

## 2011-01-17 DIAGNOSIS — G4700 Insomnia, unspecified: Secondary | ICD-10-CM

## 2011-01-17 DIAGNOSIS — E119 Type 2 diabetes mellitus without complications: Secondary | ICD-10-CM

## 2011-01-17 MED ORDER — DIAZEPAM 5 MG PO TABS: 5.0000 mg | ORAL_TABLET | Freq: Four times a day (QID) | ORAL | Status: AC | PRN

## 2011-01-17 NOTE — Progress Notes (Signed)
  Subjective:    Patient ID: [name], male    DOB: Nov 04, 1928, 77 y.o.   MRN: [id number]  HPI  Patient seen for medical followup. 2 diabetes. History of poor control. A1c 8.0% last May. Some weight gain since then. Blood sugar sometimes as high as 240 fasting. No symptoms of thirst. Chronic urine frequency unchanged. Patient on 3 drug regimen which is reviewed. No history of insulin use. Poor dietary compliance at times.  History of chronic insomnia. Has been on diazepam 5 mg each bedtime as needed and requesting refills. Multiple other medical problems reviewed. Compliant with all medications. Denies recent chest pains. He has history of chronic systolic heart failure as well as CAD. No recent chest pain  Past Medical History  Diagnosis Date  . ARTHRITIS, HIP   . Autoimmune hemolytic anemias   . CEREBROVASCULAR DISEASE   . Ischemic cardiomyopathy     S/P CABG; EF 201-25% 11/2009  . ICD (implantable cardiac defibrillator) in place     [employer]--implanted 2005  . DIABETES MELLITUS, TYPE II   . Enlargement of lymph nodes   . HYPERLIPIDEMIA   . HYPERTENSION   . HYPOTHYROIDISM   . LESION, FACE   . Nocturia   . RESTLESS LEG SYNDROME   . UNSPECIFIED ANEMIA   . VITAMIN D DEFICIENCY   . WEIGHT LOSS   . OBSTRUCTIVE SLEEP APNEA 07/19/2009   Past Surgical History  Procedure Date  . Cataract extraction   . Cholecystectomy   . Cardiac defibrillator placement   . Ventricular resection / repair aneurysm     left   . Cabg     reports that he quit smoking about 32 years ago. His smoking use included Cigarettes. He has a 40 pack-year smoking history. He uses smokeless tobacco. He reports that he does not drink alcohol or use illicit drugs. family history includes COPD in his father. No Known Allergies    Review of Systems  Constitutional: Negative for fever, chills, appetite change and unexpected weight change.  Respiratory: Negative for cough and shortness of breath.     Cardiovascular: Negative for chest pain, palpitations and leg swelling.  Gastrointestinal: Negative for abdominal pain.  Neurological: Negative for dizziness and syncope.  Psychiatric/Behavioral: Negative for confusion and dysphoric mood.       Objective:   Physical Exam  Constitutional: He is oriented to person, place, and time. He appears well-developed and well-nourished.  HENT:  Mouth/Throat: Oropharynx is clear and moist.  Neck: Neck supple.  Cardiovascular: Normal rate, regular rhythm and normal heart sounds.   Pulmonary/Chest: Effort normal and breath sounds normal. No respiratory distress. He has no wheezes. He has no rales.  Musculoskeletal:       Feet reveal no skin lesions. Good distal foot pulses. Good capillary refill. No calluses. Normal sensation with monofilament testing   Lymphadenopathy:    He has no cervical adenopathy.  Neurological: He is alert and oriented to person, place, and time.  Psychiatric: He has a normal mood and affect. His behavior is normal.          Assessment & Plan:  #1 type 2 diabetes. History of poor control. Repeat A1c. If climbing consider addition of long-acting insulin #2 chronic insomnia. Sleep hygiene discussed. Refill diazepam with caution about potential side effects

## 2011-01-18 NOTE — Progress Notes (Signed)
Quick Note:  Pt and wife informed, they will return in January 2013 ______

## 2011-02-01 ENCOUNTER — Encounter: Payer: Medicare Other | Admitting: *Deleted

## 2011-02-07 ENCOUNTER — Encounter: Payer: Self-pay | Admitting: *Deleted

## 2011-02-12 ENCOUNTER — Other Ambulatory Visit: Payer: Self-pay | Admitting: Internal Medicine

## 2011-02-12 ENCOUNTER — Ambulatory Visit (INDEPENDENT_AMBULATORY_CARE_PROVIDER_SITE_OTHER): Payer: Medicare Other | Admitting: *Deleted

## 2011-02-12 ENCOUNTER — Encounter: Payer: Self-pay | Admitting: Internal Medicine

## 2011-02-12 DIAGNOSIS — Z95810 Presence of automatic (implantable) cardiac defibrillator: Secondary | ICD-10-CM

## 2011-02-12 DIAGNOSIS — I428 Other cardiomyopathies: Secondary | ICD-10-CM

## 2011-02-13 LAB — REMOTE ICD DEVICE
BATTERY VOLTAGE: 2.55 V
RV LEAD AMPLITUDE: 12 mv
RV LEAD IMPEDENCE ICD: 420 Ohm
TZAT-0001FASTVT: 1
TZAT-0012SLOWVT: 200 ms
TZAT-0013SLOWVT: 3
TZAT-0018FASTVT: NEGATIVE
TZAT-0018SLOWVT: NEGATIVE
TZAT-0019FASTVT: 7.5 V
TZAT-0020FASTVT: 1 ms
TZAT-0020SLOWVT: 1 ms
TZON-0003FASTVT: 300 ms
TZON-0003SLOWVT: 375 ms
TZON-0004FASTVT: 12
TZON-0005FASTVT: 6
TZON-0005SLOWVT: 6
TZON-0008FASTVT: 40 ms
TZON-0008SLOWVT: 40 ms
TZON-0010FASTVT: 80 ms
TZST-0001FASTVT: 2
TZST-0001FASTVT: 4
TZST-0001SLOWVT: 2
TZST-0001SLOWVT: 5
TZST-0003FASTVT: 25 J
TZST-0003FASTVT: 36 J
TZST-0003SLOWVT: 25 J
TZST-0003SLOWVT: 36 J
VENTRICULAR PACING ICD: 1 pct

## 2011-02-14 NOTE — Progress Notes (Signed)
Remote icd check  

## 2011-02-22 ENCOUNTER — Encounter: Payer: Self-pay | Admitting: *Deleted

## 2011-03-08 ENCOUNTER — Other Ambulatory Visit: Payer: Self-pay | Admitting: Cardiology

## 2011-03-08 DIAGNOSIS — I6529 Occlusion and stenosis of unspecified carotid artery: Secondary | ICD-10-CM

## 2011-03-09 ENCOUNTER — Encounter (INDEPENDENT_AMBULATORY_CARE_PROVIDER_SITE_OTHER): Payer: Medicare Other | Admitting: *Deleted

## 2011-03-09 ENCOUNTER — Other Ambulatory Visit: Payer: Self-pay | Admitting: *Deleted

## 2011-03-09 DIAGNOSIS — I6529 Occlusion and stenosis of unspecified carotid artery: Secondary | ICD-10-CM

## 2011-05-17 ENCOUNTER — Encounter: Payer: Self-pay | Admitting: Internal Medicine

## 2011-05-17 ENCOUNTER — Ambulatory Visit (INDEPENDENT_AMBULATORY_CARE_PROVIDER_SITE_OTHER): Payer: Medicare Other | Admitting: *Deleted

## 2011-05-17 DIAGNOSIS — I5022 Chronic systolic (congestive) heart failure: Secondary | ICD-10-CM

## 2011-05-17 DIAGNOSIS — Z95810 Presence of automatic (implantable) cardiac defibrillator: Secondary | ICD-10-CM

## 2011-05-17 LAB — REMOTE ICD DEVICE
BATTERY VOLTAGE: 2.55 V
BRDY-0002RV: 40 {beats}/min
RV LEAD AMPLITUDE: 12 mv
TZAT-0001FASTVT: 1
TZAT-0004SLOWVT: 8
TZAT-0012SLOWVT: 200 ms
TZAT-0013FASTVT: 1
TZAT-0018SLOWVT: NEGATIVE
TZAT-0019SLOWVT: 7.5 V
TZAT-0020FASTVT: 1 ms
TZON-0003SLOWVT: 375 ms
TZON-0004SLOWVT: 12
TZON-0005FASTVT: 6
TZON-0008FASTVT: 40 ms
TZON-0010FASTVT: 80 ms
TZON-0010SLOWVT: 80 ms
TZST-0001FASTVT: 2
TZST-0001FASTVT: 4
TZST-0001SLOWVT: 3
TZST-0003FASTVT: 36 J
TZST-0003FASTVT: 36 J
TZST-0003SLOWVT: 36 J
VENTRICULAR PACING ICD: 1 pct

## 2011-05-29 ENCOUNTER — Encounter: Payer: Self-pay | Admitting: *Deleted

## 2011-05-30 NOTE — Progress Notes (Signed)
ICD remote 

## 2011-06-09 ENCOUNTER — Other Ambulatory Visit: Payer: Self-pay | Admitting: Cardiology

## 2011-06-25 DIAGNOSIS — Z85828 Personal history of other malignant neoplasm of skin: Secondary | ICD-10-CM | POA: Diagnosis not present

## 2011-06-25 DIAGNOSIS — L570 Actinic keratosis: Secondary | ICD-10-CM | POA: Diagnosis not present

## 2011-07-11 ENCOUNTER — Encounter: Payer: Self-pay | Admitting: Internal Medicine

## 2011-08-07 ENCOUNTER — Encounter: Payer: Medicare Other | Admitting: Internal Medicine

## 2011-08-07 ENCOUNTER — Encounter: Payer: Medicare Other | Admitting: Family Medicine

## 2011-08-14 ENCOUNTER — Ambulatory Visit (INDEPENDENT_AMBULATORY_CARE_PROVIDER_SITE_OTHER): Payer: Medicare Other | Admitting: Internal Medicine

## 2011-08-14 ENCOUNTER — Encounter: Payer: Self-pay | Admitting: Internal Medicine

## 2011-08-14 VITALS — BP 100/70 | HR 68 | Temp 97.7°F | Ht 73.0 in | Wt 200.0 lb

## 2011-08-14 VITALS — BP 112/62 | HR 62 | Ht 74.0 in | Wt 200.1 lb

## 2011-08-14 DIAGNOSIS — G4733 Obstructive sleep apnea (adult) (pediatric): Secondary | ICD-10-CM

## 2011-08-14 DIAGNOSIS — I2510 Atherosclerotic heart disease of native coronary artery without angina pectoris: Secondary | ICD-10-CM | POA: Diagnosis not present

## 2011-08-14 DIAGNOSIS — I5022 Chronic systolic (congestive) heart failure: Secondary | ICD-10-CM

## 2011-08-14 DIAGNOSIS — I10 Essential (primary) hypertension: Secondary | ICD-10-CM

## 2011-08-14 DIAGNOSIS — Z0000 Encounter for general adult medical examination without abnormal findings: Secondary | ICD-10-CM | POA: Diagnosis not present

## 2011-08-14 DIAGNOSIS — G2581 Restless legs syndrome: Secondary | ICD-10-CM

## 2011-08-14 DIAGNOSIS — G629 Polyneuropathy, unspecified: Secondary | ICD-10-CM | POA: Insufficient documentation

## 2011-08-14 DIAGNOSIS — E119 Type 2 diabetes mellitus without complications: Secondary | ICD-10-CM | POA: Diagnosis not present

## 2011-08-14 DIAGNOSIS — E785 Hyperlipidemia, unspecified: Secondary | ICD-10-CM

## 2011-08-14 DIAGNOSIS — Z95810 Presence of automatic (implantable) cardiac defibrillator: Secondary | ICD-10-CM | POA: Diagnosis not present

## 2011-08-14 DIAGNOSIS — E1140 Type 2 diabetes mellitus with diabetic neuropathy, unspecified: Secondary | ICD-10-CM | POA: Insufficient documentation

## 2011-08-14 DIAGNOSIS — H9190 Unspecified hearing loss, unspecified ear: Secondary | ICD-10-CM | POA: Insufficient documentation

## 2011-08-14 DIAGNOSIS — I2589 Other forms of chronic ischemic heart disease: Secondary | ICD-10-CM | POA: Diagnosis not present

## 2011-08-14 DIAGNOSIS — R413 Other amnesia: Secondary | ICD-10-CM

## 2011-08-14 DIAGNOSIS — M79606 Pain in leg, unspecified: Secondary | ICD-10-CM | POA: Insufficient documentation

## 2011-08-14 DIAGNOSIS — R5383 Other fatigue: Secondary | ICD-10-CM | POA: Insufficient documentation

## 2011-08-14 DIAGNOSIS — I255 Ischemic cardiomyopathy: Secondary | ICD-10-CM

## 2011-08-14 LAB — ICD DEVICE OBSERVATION
BATTERY VOLTAGE: 2.56 V
BRDY-0002RV: 40 {beats}/min
DEV-0020ICD: NEGATIVE
DEVICE MODEL ICD: 255313
RV LEAD THRESHOLD: 1 V
TZAT-0001FASTVT: 1
TZAT-0013SLOWVT: 3
TZAT-0018SLOWVT: NEGATIVE
TZAT-0019FASTVT: 7.5 V
TZAT-0019SLOWVT: 7.5 V
TZAT-0020FASTVT: 1 ms
TZON-0003SLOWVT: 375 ms
TZON-0004FASTVT: 12
TZON-0004SLOWVT: 12
TZON-0005FASTVT: 6
TZON-0005SLOWVT: 6
TZON-0008SLOWVT: 40 ms
TZON-0010FASTVT: 80 ms
TZON-0010SLOWVT: 80 ms
TZST-0001FASTVT: 4
TZST-0001SLOWVT: 2
TZST-0001SLOWVT: 5
TZST-0003FASTVT: 25 J
TZST-0003FASTVT: 36 J
TZST-0003FASTVT: 36 J
TZST-0003SLOWVT: 25 J
VENTRICULAR PACING ICD: 0 pct

## 2011-08-14 LAB — HEPATIC FUNCTION PANEL
ALT: 24 U/L (ref 0–53)
Total Bilirubin: 0.9 mg/dL (ref 0.3–1.2)

## 2011-08-14 LAB — CBC WITH DIFFERENTIAL/PLATELET
Eosinophils Relative: 2.5 % (ref 0.0–5.0)
HCT: 41.5 % (ref 39.0–52.0)
Lymphs Abs: 3.2 10*3/uL (ref 0.7–4.0)
MCHC: 32.6 g/dL (ref 30.0–36.0)
MCV: 93.6 fl (ref 78.0–100.0)
Monocytes Absolute: 1.1 10*3/uL — ABNORMAL HIGH (ref 0.1–1.0)
Platelets: 157 10*3/uL (ref 150.0–400.0)
RDW: 14.5 % (ref 11.5–14.6)
WBC: 14 10*3/uL — ABNORMAL HIGH (ref 4.5–10.5)

## 2011-08-14 LAB — HEMOGLOBIN A1C: Hgb A1c MFr Bld: 10.9 % — ABNORMAL HIGH (ref 4.6–6.5)

## 2011-08-14 LAB — LIPID PANEL
Cholesterol: 86 mg/dL (ref 0–200)
HDL: 35.4 mg/dL — ABNORMAL LOW
LDL Cholesterol: 16 mg/dL (ref 0–99)
Total CHOL/HDL Ratio: 2
Triglycerides: 172 mg/dL — ABNORMAL HIGH (ref 0.0–149.0)
VLDL: 34.4 mg/dL (ref 0.0–40.0)

## 2011-08-14 LAB — BASIC METABOLIC PANEL WITH GFR
BUN: 40 mg/dL — ABNORMAL HIGH (ref 6–23)
CO2: 28 meq/L (ref 19–32)
Calcium: 10 mg/dL (ref 8.4–10.5)
Chloride: 101 meq/L (ref 96–112)
Creatinine, Ser: 1.5 mg/dL (ref 0.4–1.5)
GFR: 46.29 mL/min — ABNORMAL LOW
Glucose, Bld: 340 mg/dL — ABNORMAL HIGH (ref 70–99)
Potassium: 5.4 meq/L — ABNORMAL HIGH (ref 3.5–5.1)
Sodium: 141 meq/L (ref 135–145)

## 2011-08-14 LAB — VITAMIN B12: Vitamin B-12: 298 pg/mL (ref 211–911)

## 2011-08-14 LAB — MICROALBUMIN / CREATININE URINE RATIO
Creatinine,U: 150.1 mg/dL
Microalb, Ur: 0.6 mg/dL (ref 0.0–1.9)

## 2011-08-14 LAB — TSH: TSH: 1 u[IU]/mL (ref 0.35–5.50)

## 2011-08-14 MED ORDER — METFORMIN HCL 1000 MG PO TABS: 1000.0000 mg | ORAL_TABLET | Freq: Two times a day (BID) | ORAL | Status: DC

## 2011-08-14 NOTE — Patient Instructions (Addendum)
Remote monitoring is used to monitor your Pacemaker of ICD from home. This monitoring reduces the number of office visits required to check your device to one time per year. It allows [place] to keep an eye on the functioning of your device to ensure it is working properly. You are scheduled for a device check from home on November 22, 2011. You may send your transmission at any time that day. If you have a wireless device, the transmission will be sent automatically. After your physician reviews your transmission, you will receive a postcard with your next transmission date.  Your physician wants you to follow-up in:  1 year with Dr Caryl Comes.  You will receive a reminder letter in the mail two months in advance. If you don't receive a letter, please call our office to schedule the follow-up appointment.  Your physician recommends that you continue on your current medications as directed. Please refer to the Current Medication list given to you today.

## 2011-08-14 NOTE — Assessment & Plan Note (Signed)
The patient's device was interrogated.  The information was reviewed. Changes were made in the programming to prolonged detection and hopefully avoid inappropriate shocks

## 2011-08-14 NOTE — Assessment & Plan Note (Signed)
stable °

## 2011-08-14 NOTE — Patient Instructions (Addendum)
Decrease the sugars  In the diet ( processed ) such as candies cakes and cookies.   Fresh fruits are ok for now.  Continue same meds for now. Uses cane support and good shoes to prevent falling . Legs  Could have decrease circulation although your pulses feel good today someone will contact you about an ABI test which is blood pressure checks in  legs. This will check  circulation. It is possible that your diabetes if sugar is too high can give you a dry mouth fatigue and numbness in your feet. Make sure you check your feet and the bottom of your feet every day to check for sore spots ulcers and infection.

## 2011-08-14 NOTE — Progress Notes (Signed)
HPI  [name] is a 78 y.o. male  seen in followup for an ICD implanted in the setting of  ischemic heart disease prior bypass surgery and chronic systolic failure.  His last echocardiogram was performed in September 2011 when he presented with congestive heart failure. It demonstrated diffuse wall motion abnormalities and an ejection fraction of 20-25% His last Myoview in March of 2009 showed an ejection fraction of 25% with a prior anterior, apical, and inferoapical infarct, but there was no ischemia.   The patient denies chest pain, shortness of breath, nocturnal dyspnea, orthopnea or peripheral edema.  There have been no palpitations, lightheadedness or syncope.   He told me the story today of having been trapped by the [place] in [place] and taking 12 days of day and night fighting to breakthrough;  three quarters of the man of the 68 didn't survive    Past Medical History  Diagnosis Date  . ARTHRITIS, HIP   . Autoimmune hemolytic anemias   . CEREBROVASCULAR DISEASE   . Ischemic cardiomyopathy     S/P CABG; EF 201-25% 11/2009  . ICD (implantable cardiac defibrillator) in place     [employer]--implanted 2005  . DIABETES MELLITUS, TYPE II   . Enlargement of lymph nodes   . HYPERLIPIDEMIA   . HYPERTENSION   . HYPOTHYROIDISM   . LESION, FACE   . Nocturia   . RESTLESS LEG SYNDROME   . UNSPECIFIED ANEMIA   . VITAMIN D DEFICIENCY   . WEIGHT LOSS   . OBSTRUCTIVE SLEEP APNEA 07/19/2009    Past Surgical History  Procedure Date  . Cataract extraction   . Cholecystectomy   . Cardiac defibrillator placement   . Ventricular resection / repair aneurysm     left   . Cabg     Current Outpatient Prescriptions  Medication Sig Dispense Refill  . aspirin EC 81 MG EC tablet Take 81 mg by mouth daily.        [name] atorvastatin (LIPITOR) 40 MG tablet Take 1 tablet (40 mg total) by mouth daily.  90 tablet  3  . carvedilol (COREG) 12.5 MG tablet Take 1 tablet (12.5 mg total) by mouth 2  (two) times daily with a meal.  180 tablet  3  . digoxin (LANOXIN) 0.125 MG tablet Take 1 tablet (125 mcg total) by mouth daily.  90 tablet  3  . escitalopram (LEXAPRO) 10 MG tablet Take 1 tablet (10 mg total) by mouth daily.  90 tablet  3  . fish oil-omega-3 fatty acids 1000 MG capsule Take 1 g by mouth daily.       . furosemide (LASIX) 40 MG tablet TAKE 1 TABLET DAILY  90 tablet  3  . glipiZIDE (GLUCOTROL) 10 MG tablet Take 1 tablet (10 mg total) by mouth 2 (two) times daily before a meal.  270 tablet  3  . glucose blood (ACCU-CHEK COMFORT CURVE) test strip Use as instructed  100 each  5  . metFORMIN (GLUCOPHAGE) 1000 MG tablet Take 1 tablet (1,000 mg total) by mouth 2 (two) times daily with a meal.  45 tablet  3  . mometasone (NASONEX) 50 MCG/ACT nasal spray 2 sprays by Nasal route daily.        . niacin (NIASPAN) 1000 MG CR tablet Take 1 tablet (1,000 mg total) by mouth daily.  90 tablet  3  . pramipexole (MIRAPEX) 0.5 MG tablet Take 1 tablet (0.5 mg total) by mouth daily. For restless legs  90  tablet  3  . ramipril (ALTACE) 5 MG capsule Take 1 capsule (5 mg total) by mouth daily.  90 capsule  3  . spironolactone (ALDACTONE) 25 MG tablet Take 1 tablet (25 mg total) by mouth daily.  90 tablet  3  . Tamsulosin HCl (FLOMAX) 0.4 MG CAPS Take 1 capsule (0.4 mg total) by mouth daily.  90 capsule  3  . saxagliptin HCl (ONGLYZA) 5 MG TABS tablet Take 1 tablet (5 mg total) by mouth daily.  90 tablet  3  . DISCONTD: metFORMIN (GLUCOPHAGE) 1000 MG tablet Take 1 tablet (1,000 mg total) by mouth 2 (two) times daily with a meal.  270 tablet  3  . DISCONTD: metFORMIN (GLUCOPHAGE) 1000 MG tablet Take 1 tablet (1,000 mg total) by mouth 2 (two) times daily with a meal.  270 tablet  3    No Known Allergies  Review of Systems negative except from HPI and PMH  Physical Exam BP 112/62  Pulse 62  Ht 6\' 2"  (1.88 m)  Wt 200 lb 1.9 oz (90.774 kg)  BMI 25.69 kg/m2 Well developed and well nourished in no acute  distress HENT normal E scleral and icterus clear; hearing aids in pace Neck Supple JVP flat; carotids brisk and full Clear to ausculation  regular rate and rhythm, no murmurs gallops or rub Soft with active bowel sounds No clubbing cyanosis none Edema Alert and oriented, grossly normal motor and sensory function Skin Warm and Dry    Assessment and  Plan

## 2011-08-14 NOTE — Progress Notes (Signed)
Subjective:    Patient ID: [name], male    DOB: 11-16-1928, 78 y.o.   MRN: [id number]  HPI Patient comes in today as a new patient transfer from Dr. Joni Fears. 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 He lives at home with his wife. He has a number of medical problems. Dm: On oral medicines metformin and on the Liza and glipizide. Likes sugar or candy jellybeans etc. his sugars have been on the high side in the 200s in the morning. He complains of fatigue and some thirst in the middle the night no vision changes or unusual infections. Obstructive sleep apnea on machine Systolic heart failure ICD hypertension ischemic cardiomyopathy on medications followed by Dr. Stanford Breed. History of CABG surgery  Review of Systems No chest pain shortness of breath legs get tired and the thighs after walking. No specific pain or back pain. Tendency to fall backwards no recent fall or injury. Has restless legs and his legs jerking at night and some medicine for this and taking tonic water. Decreased hearing using hearing aids sometimes affects intake question memory problems and occasional flushing like hot flashes. Takes niacin regularly. Takes an aspirin a day. Has a chronic nasal drip.  Past history family history social history reviewed in the electronic medical record.   Hearing: Impaired hearing aids does affect interaction socially  Vision:  No limitations at present . Check dr Herbert Deaner 2012 has glasses   Safety:  Has smoke detector and wears seat belts.  No firearms. No excess sun exposure. Sees dentist regularly.  Falls:  None recently has had some previously "tripped"  Advance directive :  Reviewed .  Memory: Felt to be good  , no concern from her or her family.  Depression: No anhedonia unusual crying or depressive symptoms  Nutrition: Eats well balanced diet; LIKES SWEETS ?adequate calcium and vitamin D. No swallowing chewiing problems.  Injury:  no major injuries in the last six months.  Other healthcare providers:  Reviewed today .  Social:  Lives with wife  No pets.  hhof 2   Preventive parameters: up-to-date onADLS:   There are no problems or need for assistance  driving, feeding, obtaining food, dressing, toileting and bathing, managing money using phone. is independent.  No significant alcohol is an ex smoker  per smoked from 1950 05/07/1973      Objective:   Physical Exam BP 100/70  Pulse 68  Temp(Src) 97.7 F (36.5 C) (Oral)  Ht 6\' 1"  (1.854 m)  Wt 200 lb (90.719 kg)  BMI 26.39 kg/m2  SpO2 95%  wdwN in no acute distress slightly flat facies has bilateral hearing aids HEENT: Normocephalic ;atraumatic , Eyes;  PERRL, EOMs  Full, lids and conjunctiva clear,,Ears: no deformities, canals nl, TM landmarks normal, Nose: no deformity or discharge  Mouth : OP clear without lesion or edema . Neck: Supple without adenopathy or masses or bruits Chest:  Clear to A&P without wheezes rales or rhonchi CV:  S1-S2 rr syst  murmurs peripheral perfusion is normal  Pulses legs 1 +?  No clubbing cyanosis or edema Abdomen:  Sof,t normal bowel sounds without hepatosplenomegaly, no guarding rebound or masses no CVA tenderness Inguinalarea fullness  But no hernia or LN. FEET : Scaling on the bottom of the feet with some thickened toenails. Decreased sensation to microfilament on the sole of the foot but intact on the dorsum. Left more than right there some calluses no ulcers or infection.  Gait disorder flat-footed but reasonably balanced. Oriented x 3 and no noted deficits in memory, attention, and speech. Oriented to time person and place formal testing not done today     Assessment & Plan:  [place] regarding healthy nutrition, exercise, sleep, injury prevention, calcium vit d and healthy weight .counseled on decreasing sweets.  DM  presumed out of control by history. He also has some neuropathic findings. Some  symptoms sound like claudication his pulses seem present but would get an ABI to be certain. CAD CHF ICD  followed by cardiology apparently stable Hx of fall   Prevention.  Discussed Mood apparently stable on Lexapro Ob sleep apnea on CPAP Restless leg syndrome multiple factors involved on low-dose Requip taking tonic water. Rule out vascular. Get B12 level today. LIPIDS;;; on meds  MOOD  On lexapro.  Fu 4 months or depending on his labs .

## 2011-08-14 NOTE — Assessment & Plan Note (Signed)
Stable on current medication labs were drawn earlier today

## 2011-08-15 ENCOUNTER — Other Ambulatory Visit: Payer: Self-pay | Admitting: Internal Medicine

## 2011-08-15 DIAGNOSIS — E119 Type 2 diabetes mellitus without complications: Secondary | ICD-10-CM

## 2011-08-15 DIAGNOSIS — I2510 Atherosclerotic heart disease of native coronary artery without angina pectoris: Secondary | ICD-10-CM

## 2011-08-15 DIAGNOSIS — M79606 Pain in leg, unspecified: Secondary | ICD-10-CM

## 2011-08-16 MED ORDER — SAXAGLIPTIN HCL 5 MG PO TABS: 5.0000 mg | ORAL_TABLET | Freq: Every day | ORAL | Status: DC

## 2011-08-16 MED ORDER — TAMSULOSIN HCL 0.4 MG PO CAPS: 0.4000 mg | ORAL_CAPSULE | Freq: Every day | ORAL | Status: DC

## 2011-08-16 MED ORDER — ESCITALOPRAM OXALATE 10 MG PO TABS: 10.0000 mg | ORAL_TABLET | Freq: Every day | ORAL | Status: DC

## 2011-08-16 MED ORDER — GLIPIZIDE 10 MG PO TABS: 10.0000 mg | ORAL_TABLET | Freq: Two times a day (BID) | ORAL | Status: DC

## 2011-08-16 MED ORDER — PRAMIPEXOLE DIHYDROCHLORIDE 0.5 MG PO TABS: 0.5000 mg | ORAL_TABLET | Freq: Every day | ORAL | Status: DC

## 2011-08-28 ENCOUNTER — Encounter (INDEPENDENT_AMBULATORY_CARE_PROVIDER_SITE_OTHER): Payer: Medicare Other

## 2011-08-28 DIAGNOSIS — E1159 Type 2 diabetes mellitus with other circulatory complications: Secondary | ICD-10-CM | POA: Diagnosis not present

## 2011-08-28 DIAGNOSIS — I2510 Atherosclerotic heart disease of native coronary artery without angina pectoris: Secondary | ICD-10-CM

## 2011-08-28 DIAGNOSIS — M79606 Pain in leg, unspecified: Secondary | ICD-10-CM

## 2011-08-28 DIAGNOSIS — E119 Type 2 diabetes mellitus without complications: Secondary | ICD-10-CM

## 2011-09-24 ENCOUNTER — Encounter: Payer: Self-pay | Admitting: Internal Medicine

## 2011-09-24 ENCOUNTER — Ambulatory Visit (INDEPENDENT_AMBULATORY_CARE_PROVIDER_SITE_OTHER): Payer: Medicare Other | Admitting: Internal Medicine

## 2011-09-24 VITALS — BP 142/70 | HR 65 | Temp 97.8°F | Wt 199.0 lb

## 2011-09-24 DIAGNOSIS — E1149 Type 2 diabetes mellitus with other diabetic neurological complication: Secondary | ICD-10-CM | POA: Diagnosis not present

## 2011-09-24 DIAGNOSIS — E1142 Type 2 diabetes mellitus with diabetic polyneuropathy: Secondary | ICD-10-CM | POA: Diagnosis not present

## 2011-09-24 DIAGNOSIS — I10 Essential (primary) hypertension: Secondary | ICD-10-CM | POA: Diagnosis not present

## 2011-09-24 DIAGNOSIS — N289 Disorder of kidney and ureter, unspecified: Secondary | ICD-10-CM

## 2011-09-24 DIAGNOSIS — E1140 Type 2 diabetes mellitus with diabetic neuropathy, unspecified: Secondary | ICD-10-CM

## 2011-09-24 LAB — BASIC METABOLIC PANEL
Chloride: 102 mEq/L (ref 96–112)
GFR: 52.96 mL/min — ABNORMAL LOW (ref >60.00)
Glucose, Bld: 309 mg/dL — ABNORMAL HIGH (ref 70–99)
Potassium: 4.8 mEq/L (ref 3.5–5.1)
Sodium: 139 mEq/L (ref 135–145)

## 2011-09-24 MED ORDER — INSULIN DETEMIR 100 UNIT/ML ~~LOC~~ SOLN: SUBCUTANEOUS | Status: DC

## 2011-09-24 NOTE — Patient Instructions (Signed)
Someone will contact you about diabetes education and nutrition consult. This could help him learn how to read labels and choose foods that might be helpful for you.  Begin Levemir or flex pen  this is a 23-24 hour insulin.  Begin with 6 units every evening. After 5 days if you're fasting blood sugar is still elevated increase by 2 units every 2 days until the fasting blood sugar is below 180  . At that time contact us about further advice or we can discuss this at an office visit.  Continue to check her blood sugars twice a day during this period one should be the fasting in the morning and another either before lunch or in the evening before you get the insulin shot.   Do not use Advil Aleve at this time to protect  kidneys use Tylenol instead if needed. We'll get a chemistry panel today to make sure your kidney function is stable.

## 2011-09-25 ENCOUNTER — Encounter: Payer: Self-pay | Admitting: Cardiology

## 2011-09-25 DIAGNOSIS — N289 Disorder of kidney and ureter, unspecified: Secondary | ICD-10-CM | POA: Insufficient documentation

## 2011-09-25 NOTE — Progress Notes (Signed)
  Subjective:    Patient ID: [name], male    DOB: 12-20-1928, 78 y.o.   MRN: [id number]  HPI Patient comes in today for follow up of  multiple medical problems.  But most specifically his diabetes. See last office note and laboratory studies that showed hemoglobin A1c over 10 and blood sugar in the 300s.  He states that he has cut out sweets and eating a lot more rabbit food. He doesn't feel that bad no syncope change in his vision. He brings with him today a copy of his blood sugars 2-3 times a day over the last month. Wife is also diabetic but her blood sugars are in control  He does take Advil 1-2 times a week occasionally.  He has not been to a nutrition evaluation or referral for diabetic diet in years. Review of Systems No fever chest pain shortness of breath no swelling at this time Past history family history social history reviewed in the electronic medical record. Med reviewed     Objective:   Physical Exam BP 142/70  Pulse 65  Temp 97.8 F (36.6 C) (Oral)  Wt 199 lb (90.266 kg)  SpO2 96% Well-developed well-nourished cognitively intact hearing aids here with wife. In no acute distress.  Review of blood sugars majority are in the 200s and 300 range he has a rare one in the high 100s.    Assessment & Plan:   Diabetes with neuropathy out of control. Despite his continued medications and lifestyle intervention. He is a candidate for low-dose 24-hour insulin. Samples of Levemir given to start off with 6 units day and increase slowly 2 units every few days goal fasting blood sugar would be 120 or below. We discussed at length importance of avoiding hypoglycemia but I do not think he has a risk of this at this time. Call for prescription if needed before he follows up. Would have to monitor once or twice a day and then recheck in about 6 weeks. Information handouts a nurse reviewing injection in the office. We'll also do a referral for diabetes nutrition  management.  Hypertension controlled Mild renal insufficiency potassium 5.4 last check we'll recheck chemistry today avoid Advil Aleve products at this time take Tylenol instead. Wife asks about liver damage from Tylenol discussed limited dosing to avoid this problem. The risk of renal failure is higher than the risk of liver damage. For now will remain on the metformin and monitor carefully.  Total visit 30 mins > 50% spent counseling and coordinating care

## 2011-10-17 ENCOUNTER — Encounter: Payer: Self-pay | Admitting: Dietician

## 2011-10-17 ENCOUNTER — Encounter: Payer: Medicare Other | Attending: Internal Medicine | Admitting: Dietician

## 2011-10-17 VITALS — Ht 74.0 in | Wt 196.0 lb

## 2011-10-17 DIAGNOSIS — E119 Type 2 diabetes mellitus without complications: Secondary | ICD-10-CM | POA: Diagnosis not present

## 2011-10-17 DIAGNOSIS — E1140 Type 2 diabetes mellitus with diabetic neuropathy, unspecified: Secondary | ICD-10-CM

## 2011-10-17 DIAGNOSIS — Z713 Dietary counseling and surveillance: Secondary | ICD-10-CM | POA: Insufficient documentation

## 2011-10-17 NOTE — Patient Instructions (Addendum)
   For your sugars decreasing, consider using glucose tablets for a glucose of 80 mg.  If you are having the shake,  Try to have a protein with every meal and snack.    sweaty feelings, and your glucose is at 100-110 then have a sandwich or your meal if it is time for the meal.    At nigh, check the glucose level after dinner before your snack and then have the snack and take your insulin.

## 2011-10-17 NOTE — Progress Notes (Signed)
Medical Nutrition Therapy:  Appt start time: 1030 end time:  1200.   Assessment:  Primary concerns today: Comes with wife to learn what type of foods to eat. Has a current A1C of "10 something" and has started on Levemir to assist with blood glucose control. He notes that when he started on the Levemir, his blood glucose was frequently 400 or greater.  Over the last few weeks, he is trying to eat better and to take his medications.  Since his visit to the MD he has lost 3 lbs.  He is trying to give up sweets and is giving up some and not totally abstaining from them at this time.  He comes with questions regarding the needle length for his pen needles and the the difference in using the thigh for injections vs using the abdomen.  We discussed how his insulin does not have a real curve as such and the duration of the Levemir.  We reviewed hypo and hyperglycemia and the treatment of hypoglycemia especially.  We talk about treating a glucose of 100-110 that caused some of the S/S of low blood sugar and how he might have a sandwich or his meal rather than using a concentrated glucose source.   Our time was brief and they are to call with questions and follow-up with a return visit for clarification.  MEDICATIONS: medication review completed.  Currently on 12 unit of Levemir. At [facility]  BLOOD GLUCOSE:  Does not have his meter or glucose log book with him today.  He recalls the following:    Fasting:112, 210, 208, 180, 180    After Dinner (2 hr):  200's or betteR  HYPOGLYCEMIA:  No S/S noted today.  Tolerated the 112 without problems  HYPERGLYCEMIA:  No S/S noted, it felt normal at higher levels.    DIETARY INTAKE:  Usual eating pattern includes 3 meals and 2 snacks per day.  Everyday foods include proteins, starches, vegetables, fruits and some sweet items.  Avoided foods include Eating/snacking on less sweets. And milk  24-hr recall:  B ( AM): 7:00-9:00  Bacon (2) or sausage (1 patty) and biscuit  (1) and an egg (2) and sometimes grits.  OR Cereal Special K with Berries, sometimes mixes with Fiber One (1.5 cup) Milk (4 oz) 1%. Water, coffee, (decaf) black Snk ( AM): none  L ( PM): 1:00 Sandwich or cottage cheese and fruit Sandwich 2 breads (ww) tomato, mayo  And couple of sugar free cookies. Drink water Snk ( PM): my have a fruit, peach, orange or apple D ( PM): 6:00-6:30  spam baked, with black eyed peas (1 cup) with sliced tomatoes and sliced cucumbers, 1 slice ww bread. Drinks tonic water with the meal Snk ( PM): 9:00 popcicle fruit bar or frozen yogurt, graham crackers with PB or celery with PB Beverages: coffee, tonic water, water.  Usual physical activity: none   Estimated energy needs:Ht: 74 in WT: 196.0 lb  BMI: 25.2 kg/m2  Adj WT:192 lb (87 kg) 1700-1800 calories 195-200 g carbohydrates 125 g protein 46-48 g fat  Progress Towards Goal(s):  In progress.   Nutritional Diagnosis:  [place]-2.1 Inpaired nutrition utilization As related to blood glucose levels.  As evidenced by 2 yr history of type 2 diabetes, A1C of 10% or greater, fasting and post meal blood glucose levels in the 200's..    Intervention:  Nutrition Completed review of the carb restricted diet for diabetes.  Review of label reading, use of the exchange list  and some carb counting.  Review of the interaction of exercise on the the blood glucose levels and the expected pre and post-meal goals to aim for..  Handouts given during visit include:  Living well with Diabetes  Controlling Blood Glucose  ADA Recommendations for Standards of Care  60 Carb Menu  Snack List  Yellow Card prescription for 60 gm CHO for meals and 15 gm plus protein at mid-afternoon and bedtime snack  Monitoring/Evaluation:  Dietary intake, exercise, blood glucose levels, and body weight in a 8-12 weeks and in the meantime, call with questions.

## 2011-11-08 ENCOUNTER — Encounter: Payer: Self-pay | Admitting: Internal Medicine

## 2011-11-08 ENCOUNTER — Ambulatory Visit (INDEPENDENT_AMBULATORY_CARE_PROVIDER_SITE_OTHER): Payer: Medicare Other | Admitting: Internal Medicine

## 2011-11-08 VITALS — BP 106/56 | HR 66 | Temp 98.3°F | Wt 197.0 lb

## 2011-11-08 DIAGNOSIS — E1149 Type 2 diabetes mellitus with other diabetic neurological complication: Secondary | ICD-10-CM | POA: Diagnosis not present

## 2011-11-08 DIAGNOSIS — I2589 Other forms of chronic ischemic heart disease: Secondary | ICD-10-CM | POA: Diagnosis not present

## 2011-11-08 DIAGNOSIS — E1142 Type 2 diabetes mellitus with diabetic polyneuropathy: Secondary | ICD-10-CM | POA: Diagnosis not present

## 2011-11-08 DIAGNOSIS — I10 Essential (primary) hypertension: Secondary | ICD-10-CM

## 2011-11-08 DIAGNOSIS — E1140 Type 2 diabetes mellitus with diabetic neuropathy, unspecified: Secondary | ICD-10-CM

## 2011-11-08 DIAGNOSIS — Z95810 Presence of automatic (implantable) cardiac defibrillator: Secondary | ICD-10-CM

## 2011-11-08 DIAGNOSIS — G2581 Restless legs syndrome: Secondary | ICD-10-CM

## 2011-11-08 DIAGNOSIS — H9190 Unspecified hearing loss, unspecified ear: Secondary | ICD-10-CM

## 2011-11-08 DIAGNOSIS — I255 Ischemic cardiomyopathy: Secondary | ICD-10-CM

## 2011-11-08 MED ORDER — CLONAZEPAM 0.5 MG PO TABS: ORAL_TABLET | ORAL | Status: DC

## 2011-11-08 NOTE — Patient Instructions (Signed)
Increase the insulin by 2 more units  Goals for the fasting blood sugar is between 90- 120. We want to avoid low blood sugars as we discussed. Consider checking post eating blood sugar 2 hours after midday meal some days and after her evening meal other days.  Continue healthier eating.  If needed can try clonazepam this is instead of the Valium  for sleep restless leg symptoms  Can take a half a milligram to a whole milligram at night this lasts about 8-12 hours as opposed to the Valium which is more likely to build up in the system and cause mental fogginess.  Call for refills as needed before your next visit  Laboratory testing in 2 months before the next visit hemoglobin A1c and a chemistry panel.  Have pharmacy contact us about shorter needles when you need a refill on your needles

## 2011-11-08 NOTE — Progress Notes (Signed)
  Subjective:    Patient ID: [name], male    DOB: 1928/04/29, 78 y.o.   MRN: [id number]  HPI Pt comesin today for fu of dm after beginning insulin  Therapy. He is here with wife. Has been to diabetes teaching  Is slowly increasing insulin and now on 12 units per day and fasting are coming down to the 130 range  Although one was 114 .  Checking pp in the veening and anywhere from 180 to high 200s  . Did related soul like shorter needles  As doesn't have that much fat in skin layers, No se and no lows.  Asks to go back ot valium prn hs for sleep and restless leg   mirapex not that helpful at present . Dr Joni Fears had given him valium in the past.  Used it no more than 2-3 x per week.   No falling infections foot ulcers   Review of Systems No cp sob falling new edema bleeding   Hearing aids no change in mentation or As per hpi Med list checked     Objective:   Physical Exam BP 106/56  Pulse 66  Temp 98.3 F (36.8 C) (Oral)  Wt 197 lb (89.359 kg)  SpO2 95%  WDWN alert pleasant WM in nad  Has hearing aids conversed well. Reviewed logs of bg readings as above.  Gait non antalgic  Oriented x 3 and no noted deficits in memory, attention, and speech.    Assessment & Plan:  \Dm uncontrolled with neuropathy   Doing better by readings   Increase 2 more units to 14 and up if not controlled.  Goal is fastin 90 - 120 or so  Long disc about avoiding  Hypoglycemia and pt aware.  Disc a1c tests and will get labs in 2 months and fu.  RLS sleep   Hx of diazepam use in past    mirapex not that helpful   Options discussed  Would prefer to try clonopin over valium at this time because of risk of metabolite  Accumulation   To see if helps and use prn hs.   i suppose gabapentin may be another choice if needed  the patient willing to try clonopin for now  If not helping can  Call for return to valium with caution.   HT ICM CAD vascular disease  Followed by cards   Total visit 47mins > 50% spent  counseling and coordinating care

## 2011-11-13 IMAGING — CR DG CHEST 2V
2 series · 2 of 2 positions shown · non-contrast
Comparison: 11/19/2009

CLINICAL DATA: Weakness, diabetes, hypertension, CHF

CHEST - 2 VIEW

[w chest lat]
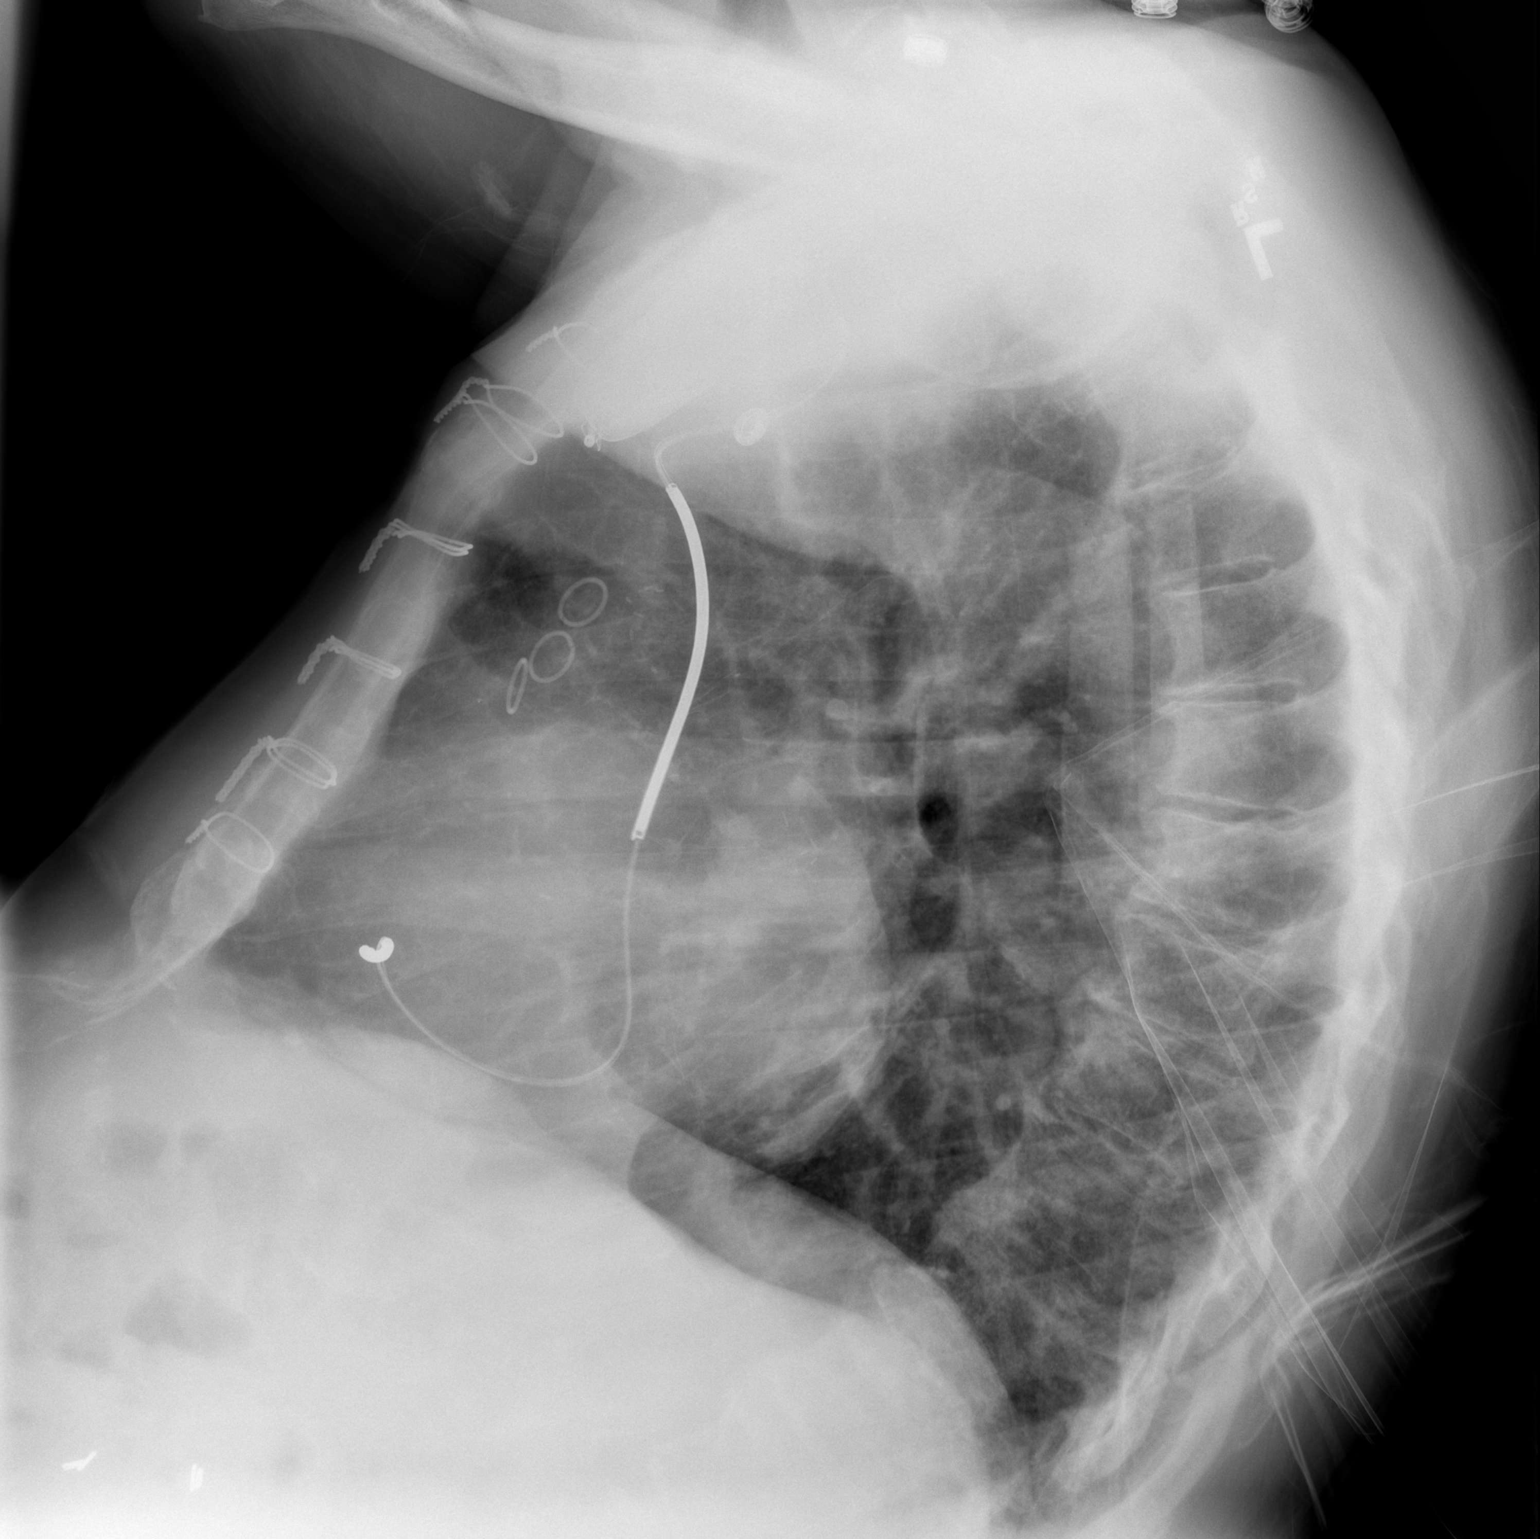

[w chest pa]
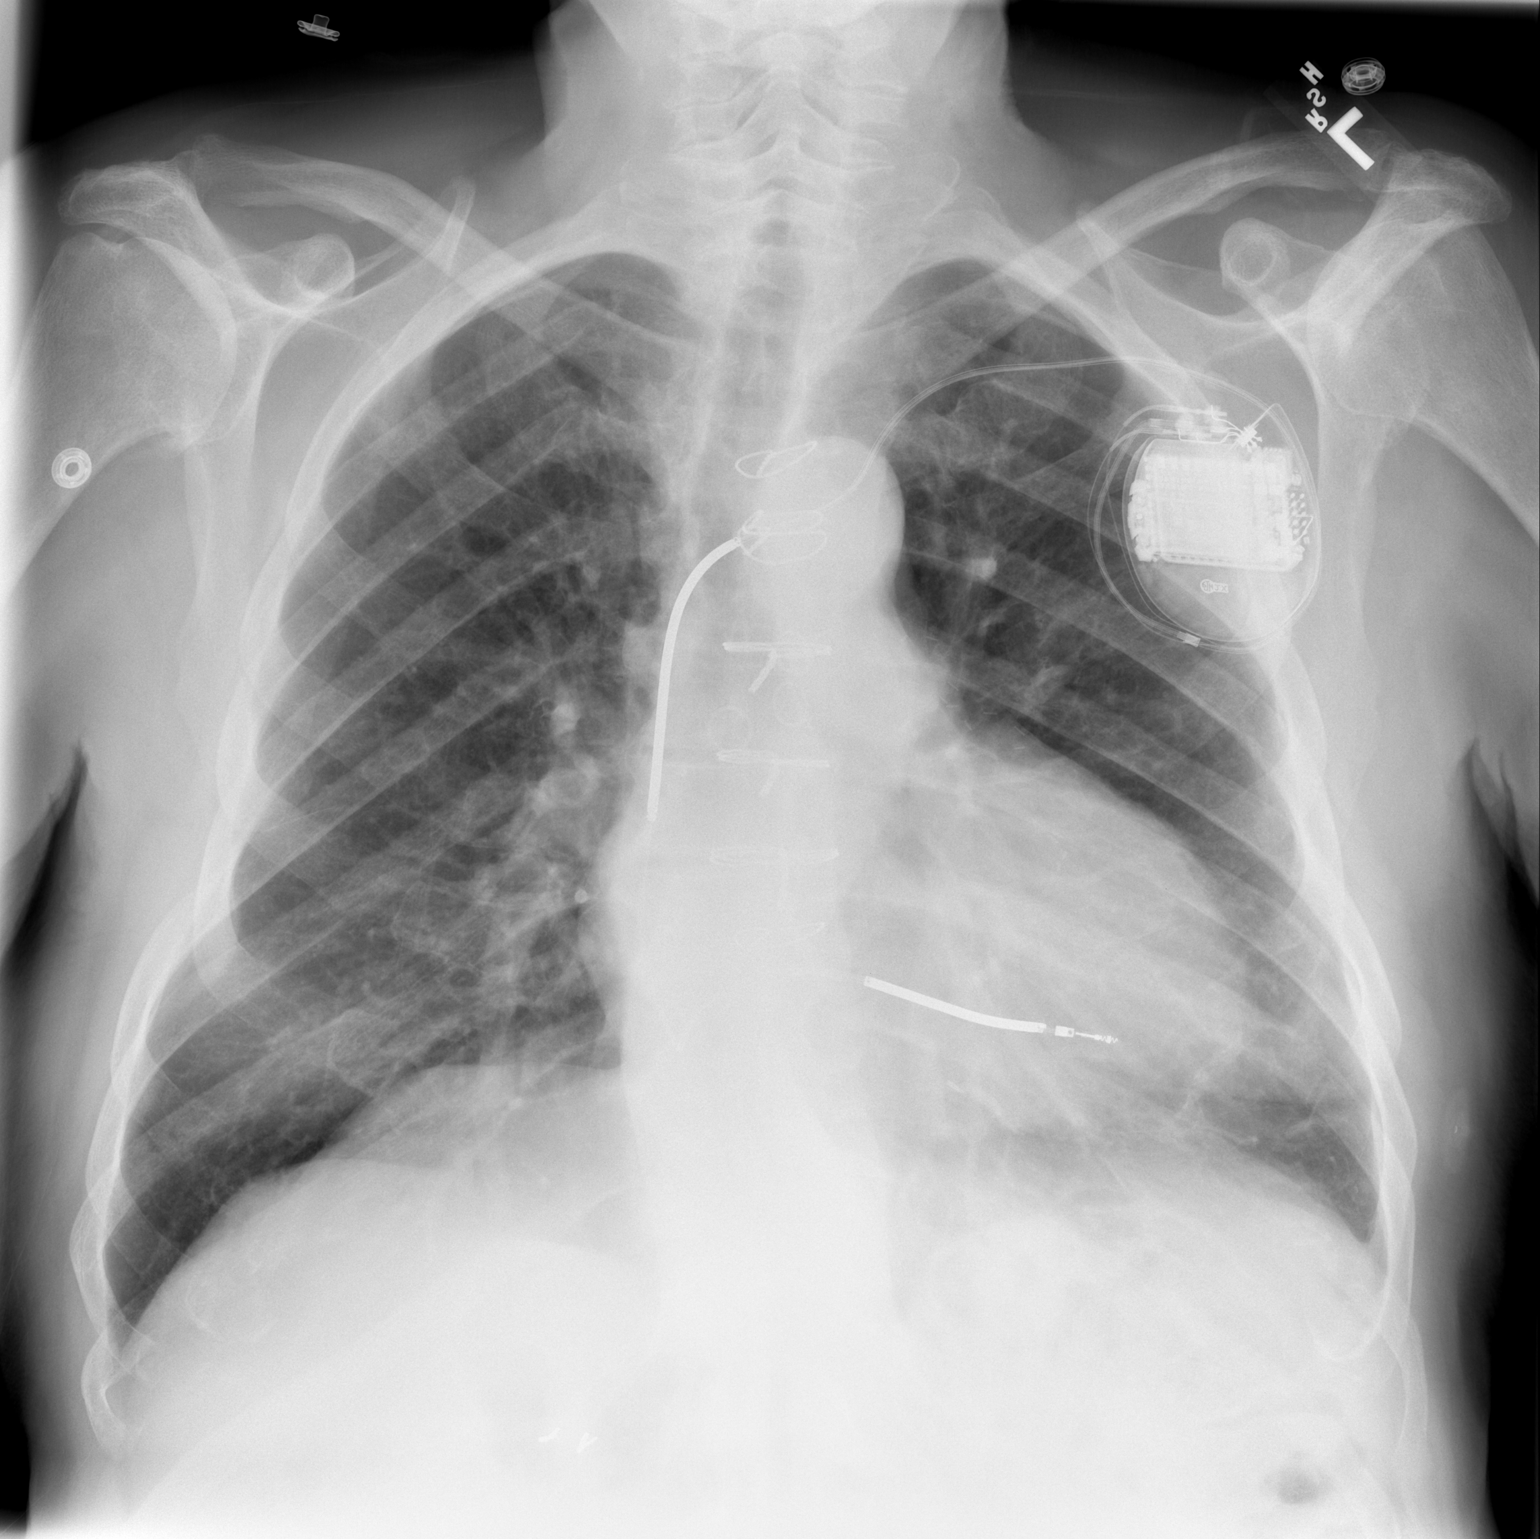

[2 of 2 positions shown; findings below may reference images not displayed]

FINDINGS: Normal heart size and vascularity.  Negative for CHF,
pneumonia, significant effusion or pneumothorax.  Coronary bypass
changes noted with a left subclavian defibrillator.  Degenerative
changes of the spine.
IMPRESSION: Negative for CHF or pneumonia

## 2011-11-21 ENCOUNTER — Other Ambulatory Visit: Payer: Self-pay | Admitting: Cardiology

## 2011-11-22 ENCOUNTER — Encounter: Payer: Self-pay | Admitting: Internal Medicine

## 2011-11-22 ENCOUNTER — Ambulatory Visit (INDEPENDENT_AMBULATORY_CARE_PROVIDER_SITE_OTHER): Payer: Medicare Other | Admitting: *Deleted

## 2011-11-22 DIAGNOSIS — Z95810 Presence of automatic (implantable) cardiac defibrillator: Secondary | ICD-10-CM

## 2011-11-22 DIAGNOSIS — I2589 Other forms of chronic ischemic heart disease: Secondary | ICD-10-CM | POA: Diagnosis not present

## 2011-11-22 DIAGNOSIS — I255 Ischemic cardiomyopathy: Secondary | ICD-10-CM

## 2011-11-23 LAB — REMOTE ICD DEVICE
BATTERY VOLTAGE: 2.55 V
BRDY-0002RV: 40 {beats}/min
DEVICE MODEL ICD: 255313
TZAT-0001FASTVT: 1
TZAT-0012FASTVT: 200 ms
TZAT-0018SLOWVT: NEGATIVE
TZAT-0019SLOWVT: 7.5 V
TZAT-0020FASTVT: 1 ms
TZON-0003SLOWVT: 375 ms
TZON-0004SLOWVT: 40
TZON-0005FASTVT: 6
TZON-0008SLOWVT: 40 ms
TZON-0010SLOWVT: 80 ms
TZST-0001FASTVT: 4
TZST-0001SLOWVT: 2
TZST-0003FASTVT: 25 J
TZST-0003FASTVT: 36 J
TZST-0003SLOWVT: 36 J

## 2011-11-26 ENCOUNTER — Telehealth: Payer: Self-pay | Admitting: Cardiology

## 2011-11-26 MED ORDER — FUROSEMIDE 40 MG PO TABS: 40.0000 mg | ORAL_TABLET | Freq: Every day | ORAL | Status: DC

## 2011-11-26 MED ORDER — DIGOXIN 125 MCG PO TABS: 125.0000 ug | ORAL_TABLET | Freq: Every day | ORAL | Status: DC

## 2011-11-26 MED ORDER — SPIRONOLACTONE 25 MG PO TABS: 25.0000 mg | ORAL_TABLET | Freq: Every day | ORAL | Status: DC

## 2011-11-26 MED ORDER — CARVEDILOL 12.5 MG PO TABS: 12.5000 mg | ORAL_TABLET | Freq: Two times a day (BID) | ORAL | Status: DC

## 2011-11-26 MED ORDER — NIACIN ER (ANTIHYPERLIPIDEMIC) 1000 MG PO TBCR: 1000.0000 mg | EXTENDED_RELEASE_TABLET | Freq: Every day | ORAL | Status: DC

## 2011-11-26 MED ORDER — RAMIPRIL 5 MG PO CAPS: 5.0000 mg | ORAL_CAPSULE | Freq: Every day | ORAL | Status: DC

## 2011-11-26 MED ORDER — ATORVASTATIN CALCIUM 40 MG PO TABS: 40.0000 mg | ORAL_TABLET | Freq: Every day | ORAL | Status: DC

## 2011-11-26 NOTE — Telephone Encounter (Signed)
Pt needs refill of ramipril 5mg , lipitor 40mg , niaspan 1000mg , digoxin 0.125 mg, carvedilol 12.5mg , spirolactone 25mg , furosimide 40mg  90 day supply, [facility]

## 2011-11-28 ENCOUNTER — Encounter: Payer: Self-pay | Admitting: *Deleted

## 2012-01-01 ENCOUNTER — Other Ambulatory Visit (INDEPENDENT_AMBULATORY_CARE_PROVIDER_SITE_OTHER): Payer: Medicare Other

## 2012-01-01 DIAGNOSIS — E119 Type 2 diabetes mellitus without complications: Secondary | ICD-10-CM

## 2012-01-01 DIAGNOSIS — Z23 Encounter for immunization: Secondary | ICD-10-CM

## 2012-01-01 LAB — BASIC METABOLIC PANEL
CO2: 29 mEq/L (ref 19–32)
Calcium: 9.6 mg/dL (ref 8.4–10.5)
Chloride: 104 mEq/L (ref 96–112)
Sodium: 140 mEq/L (ref 135–145)

## 2012-01-08 ENCOUNTER — Encounter: Payer: Self-pay | Admitting: Internal Medicine

## 2012-01-08 ENCOUNTER — Ambulatory Visit (INDEPENDENT_AMBULATORY_CARE_PROVIDER_SITE_OTHER): Payer: Medicare Other | Admitting: Internal Medicine

## 2012-01-08 VITALS — BP 124/60 | HR 70 | Temp 98.2°F | Wt 199.0 lb

## 2012-01-08 DIAGNOSIS — M533 Sacrococcygeal disorders, not elsewhere classified: Secondary | ICD-10-CM

## 2012-01-08 DIAGNOSIS — I255 Ischemic cardiomyopathy: Secondary | ICD-10-CM

## 2012-01-08 DIAGNOSIS — Z95810 Presence of automatic (implantable) cardiac defibrillator: Secondary | ICD-10-CM

## 2012-01-08 DIAGNOSIS — E1142 Type 2 diabetes mellitus with diabetic polyneuropathy: Secondary | ICD-10-CM

## 2012-01-08 DIAGNOSIS — G479 Sleep disorder, unspecified: Secondary | ICD-10-CM

## 2012-01-08 DIAGNOSIS — E1149 Type 2 diabetes mellitus with other diabetic neurological complication: Secondary | ICD-10-CM

## 2012-01-08 DIAGNOSIS — G2581 Restless legs syndrome: Secondary | ICD-10-CM

## 2012-01-08 DIAGNOSIS — I2589 Other forms of chronic ischemic heart disease: Secondary | ICD-10-CM

## 2012-01-08 DIAGNOSIS — N289 Disorder of kidney and ureter, unspecified: Secondary | ICD-10-CM

## 2012-01-08 DIAGNOSIS — E1140 Type 2 diabetes mellitus with diabetic neuropathy, unspecified: Secondary | ICD-10-CM

## 2012-01-08 MED ORDER — TIZANIDINE HCL 2 MG PO TABS: 2.0000 mg | ORAL_TABLET | Freq: Three times a day (TID) | ORAL | Status: DC | PRN

## 2012-01-08 MED ORDER — CLONAZEPAM 1 MG PO TABS: 1.0000 mg | ORAL_TABLET | Freq: Every evening | ORAL | Status: DC | PRN

## 2012-01-08 NOTE — Progress Notes (Signed)
  Subjective:    Patient ID: [name], male    DOB: January 02, 1929, 78 y.o.   MRN: [id number]  HPI Patient comes in today for follow up of  multiple medical problems. Here with wife.  DM with neuropathy  Readings now on insulin better without lows has log today. Needs shorter nano needles better that short ones on levemir 14 unitsl  Eating some better.  LEGS bothering him at night and restless with his neuropathy only tried Total visit 92mins > 50% spent counseling and coordinating care  On mirapex  ? Not helping.    clonopin and needs something stronger. Getting  low back hip pain at times and wants a muscle relaxer  Or asks about med to take and safety  Heart tylenol may not be safe .   Review of Systems NO new cp sob edema falling bleeding vision change   Past history family history social history reviewed in the electronic medical record.     Objective:   Physical Exam BP 124/60  Pulse 70  Temp 98.2 F (36.8 C) (Oral)  Wt 199 lb (90.266 kg)  SpO2 96% wdwn in nad looks well  Hard of hearing  Has log of readings Chest:  Clear to A&P without wheezes rales or rhonchi CV:  S1-S2 no gallops or murmurs peripheral perfusion is normal Gait slightly antalgic favoring the right  Lower back but easily arises and walks with good balance today.   Lab Results  Component Value Date   HGBA1C 7.1* 01/01/2012     Chemistry      Component Value Date/Time   NA 140 01/01/2012 0922   K 4.3 01/01/2012 0922   CL 104 01/01/2012 0922   CO2 29 01/01/2012 0922   BUN 32* 01/01/2012 0922   CREATININE 1.3 01/01/2012 0922      Component Value Date/Time   CALCIUM 9.6 01/01/2012 0922   ALKPHOS 47 08/14/2011 0936   AST 27 08/14/2011 0936   ALT 24 08/14/2011 0936   BILITOT 0.9 08/14/2011 0936         Assessment & Plan:  DM  With neuropathy and CV disease   Much better  control continue  Counseled. Cr 1.3  Stable  To follow Back hip pain seems MS and not alarming   Risk benefit of medication  discussed. Tylenol safer than nsaid in his situation. And muscle relaxant with care  RSL night and alsosleepproblems  Can increase the  clonipen to 1 mg per night and see how he does.  Uncertain it the mirapex has helped in although can increase this  In future if needed.    Total visit 45mins > 50% spent counseling and coordinating care

## 2012-01-08 NOTE — Patient Instructions (Signed)
Continue the insulin you are doing very well.  No  Change will send in nano needles to your pharmacy.  Plan trial increase dose of the klonipen to 1 mg per night to see if that helps.   Can try  Mild muscle relaxant if needed for the back but caution with sedation.   Can use tylenol acetomenophen 325 1-2  upt to 3 x per day if needed for  The back hip pain .  This may be safer than advil at this time.   ROV in 4 months  Labs pre visit

## 2012-01-10 ENCOUNTER — Other Ambulatory Visit: Payer: Self-pay | Admitting: Dermatology

## 2012-01-10 DIAGNOSIS — L570 Actinic keratosis: Secondary | ICD-10-CM | POA: Diagnosis not present

## 2012-01-10 DIAGNOSIS — C44320 Squamous cell carcinoma of skin of unspecified parts of face: Secondary | ICD-10-CM | POA: Diagnosis not present

## 2012-01-12 DIAGNOSIS — G479 Sleep disorder, unspecified: Secondary | ICD-10-CM | POA: Insufficient documentation

## 2012-01-12 DIAGNOSIS — M533 Sacrococcygeal disorders, not elsewhere classified: Secondary | ICD-10-CM | POA: Insufficient documentation

## 2012-01-12 NOTE — Assessment & Plan Note (Signed)
Lab Results  Component Value Date   HGBA1C 7.1* 01/01/2012   Better on insulin continue

## 2012-01-16 DIAGNOSIS — H353190 Nonexudative age-related macular degeneration, unspecified eye, stage unspecified: Secondary | ICD-10-CM | POA: Diagnosis not present

## 2012-01-16 DIAGNOSIS — IMO0001 Reserved for inherently not codable concepts without codable children: Secondary | ICD-10-CM | POA: Diagnosis not present

## 2012-01-16 DIAGNOSIS — H04129 Dry eye syndrome of unspecified lacrimal gland: Secondary | ICD-10-CM | POA: Diagnosis not present

## 2012-01-16 DIAGNOSIS — H35039 Hypertensive retinopathy, unspecified eye: Secondary | ICD-10-CM | POA: Diagnosis not present

## 2012-01-28 ENCOUNTER — Ambulatory Visit (INDEPENDENT_AMBULATORY_CARE_PROVIDER_SITE_OTHER): Payer: Medicare Other | Admitting: *Deleted

## 2012-01-28 ENCOUNTER — Encounter: Payer: Self-pay | Admitting: Internal Medicine

## 2012-01-28 DIAGNOSIS — I2589 Other forms of chronic ischemic heart disease: Secondary | ICD-10-CM | POA: Diagnosis not present

## 2012-01-28 DIAGNOSIS — Z95810 Presence of automatic (implantable) cardiac defibrillator: Secondary | ICD-10-CM

## 2012-01-28 DIAGNOSIS — I255 Ischemic cardiomyopathy: Secondary | ICD-10-CM

## 2012-01-29 ENCOUNTER — Encounter: Payer: Self-pay | Admitting: *Deleted

## 2012-01-29 LAB — REMOTE ICD DEVICE
BATTERY VOLTAGE: 2.55 V
RV LEAD AMPLITUDE: 12 mv
RV LEAD IMPEDENCE ICD: 415 Ohm
TZAT-0001FASTVT: 1
TZAT-0004FASTVT: 8
TZAT-0004SLOWVT: 8
TZAT-0012FASTVT: 200 ms
TZAT-0012SLOWVT: 200 ms
TZAT-0013SLOWVT: 3
TZAT-0018FASTVT: NEGATIVE
TZAT-0018SLOWVT: NEGATIVE
TZAT-0019FASTVT: 7.5 V
TZAT-0020FASTVT: 1 ms
TZON-0003FASTVT: 300 ms
TZON-0003SLOWVT: 375 ms
TZON-0004FASTVT: 24
TZON-0005FASTVT: 6
TZON-0010FASTVT: 80 ms
TZST-0001FASTVT: 3
TZST-0001FASTVT: 4
TZST-0001SLOWVT: 3
TZST-0001SLOWVT: 5
TZST-0003FASTVT: 25 J
TZST-0003FASTVT: 36 J
TZST-0003SLOWVT: 25 J
TZST-0003SLOWVT: 36 J
VENTRICULAR PACING ICD: 1 pct

## 2012-02-21 DIAGNOSIS — Z85828 Personal history of other malignant neoplasm of skin: Secondary | ICD-10-CM | POA: Diagnosis not present

## 2012-02-25 ENCOUNTER — Encounter: Payer: Self-pay | Admitting: *Deleted

## 2012-03-03 ENCOUNTER — Ambulatory Visit (INDEPENDENT_AMBULATORY_CARE_PROVIDER_SITE_OTHER): Payer: Medicare Other | Admitting: *Deleted

## 2012-03-03 DIAGNOSIS — Z95810 Presence of automatic (implantable) cardiac defibrillator: Secondary | ICD-10-CM

## 2012-03-04 ENCOUNTER — Encounter: Payer: Self-pay | Admitting: Internal Medicine

## 2012-03-04 LAB — REMOTE ICD DEVICE
BATTERY VOLTAGE: 2.55 V
BRDY-0002RV: 40 {beats}/min
DEV-0020ICD: NEGATIVE
RV LEAD AMPLITUDE: 12 mv
TZAT-0013FASTVT: 1
TZAT-0018FASTVT: NEGATIVE
TZAT-0020FASTVT: 1 ms
TZAT-0020SLOWVT: 1 ms
TZON-0003FASTVT: 300 ms
TZON-0003SLOWVT: 375 ms
TZON-0004SLOWVT: 40
TZON-0005FASTVT: 6
TZON-0008FASTVT: 40 ms
TZON-0010FASTVT: 80 ms
TZST-0001FASTVT: 2
TZST-0001FASTVT: 4
TZST-0001SLOWVT: 2
TZST-0001SLOWVT: 4
TZST-0001SLOWVT: 5
TZST-0003FASTVT: 36 J
TZST-0003SLOWVT: 25 J
TZST-0003SLOWVT: 36 J
VENTRICULAR PACING ICD: 1 pct

## 2012-03-13 ENCOUNTER — Emergency Department (HOSPITAL_COMMUNITY)
Admission: EM | Admit: 2012-03-13 | Discharge: 2012-03-13 | Disposition: A | Discharge status: Home / Self Care | Payer: Medicare Other | Attending: Emergency Medicine | Admitting: Emergency Medicine

## 2012-03-13 ENCOUNTER — Encounter (HOSPITAL_COMMUNITY): Payer: Self-pay | Admitting: Emergency Medicine

## 2012-03-13 DIAGNOSIS — E559 Vitamin D deficiency, unspecified: Secondary | ICD-10-CM | POA: Diagnosis not present

## 2012-03-13 DIAGNOSIS — W268XXA Contact with other sharp object(s), not elsewhere classified, initial encounter: Secondary | ICD-10-CM | POA: Insufficient documentation

## 2012-03-13 DIAGNOSIS — Z951 Presence of aortocoronary bypass graft: Secondary | ICD-10-CM | POA: Insufficient documentation

## 2012-03-13 DIAGNOSIS — E039 Hypothyroidism, unspecified: Secondary | ICD-10-CM | POA: Diagnosis not present

## 2012-03-13 DIAGNOSIS — Z95810 Presence of automatic (implantable) cardiac defibrillator: Secondary | ICD-10-CM | POA: Diagnosis not present

## 2012-03-13 DIAGNOSIS — Y929 Unspecified place or not applicable: Secondary | ICD-10-CM | POA: Insufficient documentation

## 2012-03-13 DIAGNOSIS — IMO0002 Reserved for concepts with insufficient information to code with codable children: Secondary | ICD-10-CM

## 2012-03-13 DIAGNOSIS — M1610 Unilateral primary osteoarthritis, unspecified hip: Secondary | ICD-10-CM | POA: Diagnosis not present

## 2012-03-13 DIAGNOSIS — Z794 Long term (current) use of insulin: Secondary | ICD-10-CM | POA: Insufficient documentation

## 2012-03-13 DIAGNOSIS — Z9889 Other specified postprocedural states: Secondary | ICD-10-CM | POA: Insufficient documentation

## 2012-03-13 DIAGNOSIS — G2581 Restless legs syndrome: Secondary | ICD-10-CM | POA: Diagnosis not present

## 2012-03-13 DIAGNOSIS — D649 Anemia, unspecified: Secondary | ICD-10-CM | POA: Diagnosis not present

## 2012-03-13 DIAGNOSIS — Z87891 Personal history of nicotine dependence: Secondary | ICD-10-CM | POA: Diagnosis not present

## 2012-03-13 DIAGNOSIS — Y939 Activity, unspecified: Secondary | ICD-10-CM | POA: Insufficient documentation

## 2012-03-13 DIAGNOSIS — S61409A Unspecified open wound of unspecified hand, initial encounter: Secondary | ICD-10-CM | POA: Diagnosis not present

## 2012-03-13 DIAGNOSIS — I679 Cerebrovascular disease, unspecified: Secondary | ICD-10-CM | POA: Insufficient documentation

## 2012-03-13 DIAGNOSIS — D591 Autoimmune hemolytic anemia, unspecified: Secondary | ICD-10-CM | POA: Diagnosis not present

## 2012-03-13 DIAGNOSIS — E785 Hyperlipidemia, unspecified: Secondary | ICD-10-CM | POA: Diagnosis not present

## 2012-03-13 DIAGNOSIS — G4733 Obstructive sleep apnea (adult) (pediatric): Secondary | ICD-10-CM | POA: Insufficient documentation

## 2012-03-13 DIAGNOSIS — Z79899 Other long term (current) drug therapy: Secondary | ICD-10-CM | POA: Insufficient documentation

## 2012-03-13 DIAGNOSIS — Z23 Encounter for immunization: Secondary | ICD-10-CM | POA: Insufficient documentation

## 2012-03-13 DIAGNOSIS — Z7982 Long term (current) use of aspirin: Secondary | ICD-10-CM | POA: Insufficient documentation

## 2012-03-13 DIAGNOSIS — R351 Nocturia: Secondary | ICD-10-CM | POA: Diagnosis not present

## 2012-03-13 DIAGNOSIS — Z9849 Cataract extraction status, unspecified eye: Secondary | ICD-10-CM | POA: Insufficient documentation

## 2012-03-13 DIAGNOSIS — E119 Type 2 diabetes mellitus without complications: Secondary | ICD-10-CM | POA: Insufficient documentation

## 2012-03-13 DIAGNOSIS — Z9089 Acquired absence of other organs: Secondary | ICD-10-CM | POA: Diagnosis not present

## 2012-03-13 DIAGNOSIS — R634 Abnormal weight loss: Secondary | ICD-10-CM | POA: Insufficient documentation

## 2012-03-13 MED ORDER — TETANUS-DIPHTH-ACELL PERTUSSIS 5-2.5-18.5 LF-MCG/0.5 IM SUSP
0.5000 mL | Freq: Once | INTRAMUSCULAR | Status: AC
  Administered 2012-03-13: 0.5 mL via INTRAMUSCULAR
  Filled 2012-03-13: qty 0.5

## 2012-03-13 MED ORDER — OXYCODONE-ACETAMINOPHEN 5-325 MG PO TABS: 2.0000 | ORAL_TABLET | ORAL | Status: DC | PRN

## 2012-03-13 NOTE — ED Provider Notes (Signed)
History     CSN: [id number]  Arrival date & time 03/13/12  1350   First MD Initiated Contact with Patient 03/13/12 1426      Chief Complaint  Patient presents with  . Extremity Laceration    (Consider location/radiation/quality/duration/timing/severity/associated sxs/prior treatment) Patient is a 79 y.o. male presenting with skin laceration. The history is provided by the patient and the spouse. No language interpreter was used.  Laceration  The incident occurred 3 to 5 hours ago. The laceration is located on the left hand. The laceration mechanism was a a metal edge. The pain is at a severity of 3/10. The pain is mild. The pain has been constant since onset. He reports no foreign bodies present. His tetanus status is out of date.  78yo male with laceration to L hand sustained on a metal can today.  Bleeding presently.  Takes asa daily.   Past Medical History  Diagnosis Date  . ARTHRITIS, HIP   . Autoimmune hemolytic anemias   . CEREBROVASCULAR DISEASE   . Ischemic cardiomyopathy     S/P CABG; EF 201-25% 11/2009  . ICD (implantable cardiac defibrillator) in place     [facility]--implanted 2005  . DIABETES MELLITUS, TYPE II   . Enlargement of lymph nodes   . HYPERLIPIDEMIA   . HYPERTENSION   . HYPOTHYROIDISM   . LESION, FACE   . Nocturia   . RESTLESS LEG SYNDROME   . UNSPECIFIED ANEMIA   . VITAMIN D DEFICIENCY   . WEIGHT LOSS   . OBSTRUCTIVE SLEEP APNEA 07/19/2009    Past Surgical History  Procedure Date  . Cataract extraction   . Cholecystectomy   . Cardiac defibrillator placement   . Ventricular resection / repair aneurysm     left   . Cabg     Family History  Problem Relation Age of Onset  . COPD Father     History  Substance Use Topics  . Smoking status: Former Smoker -- 1.0 packs/day for 40 years    Types: Cigarettes    Quit date: 03/19/1978  . Smokeless tobacco: Current User  . Alcohol Use: No      Review of Systems  Constitutional: Negative.     HENT: Negative.   Eyes: Negative.   Respiratory: Negative.   Cardiovascular: Negative.   Gastrointestinal: Negative.   Skin:       Laceration to hand  Neurological: Negative.   Psychiatric/Behavioral: Negative.   All other systems reviewed and are negative.    Allergies  Review of patient's allergies indicates no known allergies.  Home Medications   Current Outpatient Rx  Name  Route  Sig  Dispense  Refill  . ASPIRIN EC 81 MG PO TBEC   Oral   Take 81 mg by mouth daily.           . ATORVASTATIN CALCIUM 40 MG PO TABS   Oral   Take 1 tablet (40 mg total) by mouth daily.   90 tablet   3   . CARVEDILOL 12.5 MG PO TABS   Oral   Take 1 tablet (12.5 mg total) by mouth 2 (two) times daily with a meal.   180 tablet   3   . CLONAZEPAM 1 MG PO TABS   Oral   Take 1 tablet (1 mg total) by mouth at bedtime as needed (OR AS DIRECTED ).   30 tablet   3   . DIGOXIN 0.125 MG PO TABS   Oral   Take  1 tablet (125 mcg total) by mouth daily.   90 tablet   3   . DOCUSATE SODIUM 100 MG PO CAPS   Oral   Take 100 mg by mouth daily.         [name] ESCITALOPRAM OXALATE 10 MG PO TABS   Oral   Take 1 tablet (10 mg total) by mouth daily.   90 tablet   3   . OMEGA-3 FATTY ACIDS 1000 MG PO CAPS   Oral   Take 1 g by mouth daily.          . FUROSEMIDE 40 MG PO TABS   Oral   Take 1 tablet (40 mg total) by mouth daily.   90 tablet   3   . GLIPIZIDE 10 MG PO TABS   Oral   Take 1 tablet (10 mg total) by mouth 2 (two) times daily before a meal.   270 tablet   3   . GLUCOSE BLOOD VI STRP      Use as instructed   100 each   5   . INSULIN DETEMIR 100 UNIT/ML [place] SOLN      Daily at bedtime 14 UNITS OR AS DIRECTED  With needles NANO 4 MM   15 mL   12   . IPRATROPIUM BROMIDE 0.03 % NA SOLN   Nasal   Place 2 sprays into the nose 2 (two) times daily.         [name] METFORMIN HCL 1000 MG PO TABS   Oral   Take 1 tablet (1,000 mg total) by mouth 2 (two) times daily with a  meal.   45 tablet   3   . NIACIN ER (ANTIHYPERLIPIDEMIC) 1000 MG PO TBCR   Oral   Take 1 tablet (1,000 mg total) by mouth at bedtime.   90 tablet   3   . PRAMIPEXOLE DIHYDROCHLORIDE 0.5 MG PO TABS   Oral   Take 1 tablet (0.5 mg total) by mouth daily. For restless legs   90 tablet   3   . RAMIPRIL 5 MG PO CAPS   Oral   Take 1 capsule (5 mg total) by mouth daily.   90 capsule   3   . SAXAGLIPTIN HCL 5 MG PO TABS   Oral   Take 1 tablet (5 mg total) by mouth daily.   90 tablet   3   . SPIRONOLACTONE 25 MG PO TABS   Oral   Take 1 tablet (25 mg total) by mouth daily.   90 tablet   3   . TAMSULOSIN HCL 0.4 MG PO CAPS   Oral   Take 1 capsule (0.4 mg total) by mouth daily.   90 capsule   3   . TIZANIDINE HCL 2 MG PO TABS   Oral   Take 1 tablet (2 mg total) by mouth every 8 (eight) hours as needed (back spasm).   30 tablet   1   . OXYCODONE-ACETAMINOPHEN 5-325 MG PO TABS   Oral   Take 2 tablets by mouth every 4 (four) hours as needed for pain.   6 tablet   0     BP 126/62  Pulse 66  Temp 97.4 F (36.3 C) (Oral)  Resp 16  SpO2 97%  Physical Exam  Nursing note and vitals reviewed. Constitutional: He is oriented to person, place, and time. He appears well-developed and well-nourished.  HENT:  Head: Normocephalic.  Eyes: Conjunctivae normal and EOM are normal. Pupils are  equal, round, and reactive to light.  Neck: Normal range of motion. Neck supple.  Cardiovascular: Normal rate.   Pulmonary/Chest: Effort normal.  Abdominal: Soft.  Musculoskeletal: Normal range of motion.  Neurological: He is alert and oriented to person, place, and time.  Skin: Skin is warm and dry.       4cm laceratin to l hand at the base of the 2nd finger  Psychiatric: He has a normal mood and affect.    ED Course  LACERATION REPAIR Date/Time: 03/13/2012 4:18 PM Performed by: Julieta Bellini Authorized by: Julieta Bellini Consent: Verbal consent obtained. Risks and benefits:  risks, benefits and alternatives were discussed Consent given by: patient Patient understanding: patient states understanding of the procedure being performed Patient identity confirmed: verbally with patient, arm band, provided demographic data and hospital-assigned identification number Time out: Immediately prior to procedure a "time out" was called to verify the correct patient, procedure, equipment, support staff and site/side marked as required. Body area: upper extremity Location details: left hand Laceration length: 4 cm Foreign bodies: metal Tendon involvement: none Nerve involvement: none Vascular damage: no Anesthesia: local infiltration Local anesthetic: lidocaine 2% without epinephrine Anesthetic total: 4 ml Patient sedated: no Preparation: Patient was prepped and draped in the usual sterile fashion. Irrigation solution: saline Irrigation method: jet lavage Amount of cleaning: standard Debridement: none Skin closure: 5-0 Prolene Number of sutures: 4 Approximation: close Approximation difficulty: simple Dressing: 4x4 sterile gauze Patient tolerance: Patient tolerated the procedure well with no immediate complications. Comments: Pressure dressing and split applied   (including critical care time)  Labs Reviewed - No data to display No results found.   1. Laceration       MDM  Laceration repair Clotting material applied.  Tetanus updated.  Splint provided. Patient understands to return in 6-8 days for suture removal.  Ready for discharge.         Julieta Bellini, NP 03/13/12 2221

## 2012-03-13 NOTE — ED Notes (Signed)
Patient states that he cut his hand on a piece of tin earlier today - at 1030 am - He has been unable to get the bleeding to stop

## 2012-03-17 ENCOUNTER — Other Ambulatory Visit: Payer: Self-pay | Admitting: Internal Medicine

## 2012-03-17 DIAGNOSIS — E119 Type 2 diabetes mellitus without complications: Secondary | ICD-10-CM

## 2012-03-17 MED ORDER — GLUCOSE BLOOD VI STRP: ORAL_STRIP | Status: DC

## 2012-03-17 NOTE — Telephone Encounter (Signed)
Pt needs refill of glucose blood (ACCU-CHEK COMFORT CURVE) test strip sent to  [facility]/[address].

## 2012-03-17 NOTE — Telephone Encounter (Signed)
Sent to the pharmacy by e-scribe. 

## 2012-03-18 NOTE — ED Provider Notes (Signed)
Medical screening examination/treatment/procedure(s) were performed by non-physician practitioner and as supervising physician I was immediately available for consultation/collaboration.  Virgel Manifold, MD 03/18/12 1310

## 2012-03-20 ENCOUNTER — Encounter: Payer: Self-pay | Admitting: *Deleted

## 2012-03-24 ENCOUNTER — Ambulatory Visit (INDEPENDENT_AMBULATORY_CARE_PROVIDER_SITE_OTHER): Payer: Medicare Other | Admitting: Family Medicine

## 2012-03-24 ENCOUNTER — Encounter: Payer: Self-pay | Admitting: Family Medicine

## 2012-03-24 VITALS — BP 122/84 | HR 69 | Temp 98.3°F | Wt 212.0 lb

## 2012-03-24 DIAGNOSIS — S61419A Laceration without foreign body of unspecified hand, initial encounter: Secondary | ICD-10-CM

## 2012-03-24 DIAGNOSIS — S61409A Unspecified open wound of unspecified hand, initial encounter: Secondary | ICD-10-CM | POA: Diagnosis not present

## 2012-03-24 NOTE — Patient Instructions (Addendum)
-  wear splint for 1 week to avoid moving this area  -keep clean and dry  -see a doctor if drainage, redness, pain or swelling  -follow up in 1 week with your doctor

## 2012-03-24 NOTE — Progress Notes (Signed)
Chief Complaint  Patient presents with  . Suture / Staple Removal    HPI:  Laceration follow up: -per [facility] seen in ED 12/26 for lac on hand -4 sutures place and tetanus updated -reports no pain at site, no drainage, puts neosporin  ROS: See pertinent positives and negatives per HPI.  Past Medical History  Diagnosis Date  . ARTHRITIS, HIP   . Autoimmune hemolytic anemias   . CEREBROVASCULAR DISEASE   . Ischemic cardiomyopathy     S/P CABG; EF 201-25% 11/2009  . ICD (implantable cardiac defibrillator) in place     [facility]--implanted 2005  . DIABETES MELLITUS, TYPE II   . Enlargement of lymph nodes   . HYPERLIPIDEMIA   . HYPERTENSION   . HYPOTHYROIDISM   . LESION, FACE   . Nocturia   . RESTLESS LEG SYNDROME   . UNSPECIFIED ANEMIA   . VITAMIN D DEFICIENCY   . WEIGHT LOSS   . OBSTRUCTIVE SLEEP APNEA 07/19/2009    Family History  Problem Relation Age of Onset  . COPD Father     History   Social History  . Marital Status: Married    Spouse Name: N/A    Number of Children: N/A  . Years of Education: N/A   Social History Main Topics  . Smoking status: Former Smoker -- 1.0 packs/day for 40 years    Types: Cigarettes    Quit date: 03/19/1978  . Smokeless tobacco: Current User  . Alcohol Use: No  . Drug Use: No  . Sexually Active: No   Other Topics Concern  . None   Social History Narrative   hhof 2 marriedNo pets  In [place] for over 33 years. Retired Education officer, museum.    Current outpatient prescriptions:aspirin EC 81 MG EC tablet, Take 81 mg by mouth daily.  , Disp: , Rfl: ;  atorvastatin (LIPITOR) 40 MG tablet, Take 1 tablet (40 mg total) by mouth daily., Disp: 90 tablet, Rfl: 3;  carvedilol (COREG) 12.5 MG tablet, Take 1 tablet (12.5 mg total) by mouth 2 (two) times daily with a meal., Disp: 180 tablet, Rfl: 3 clonazePAM (KLONOPIN) 1 MG tablet, Take 1 tablet (1 mg total) by mouth at bedtime as needed (OR AS DIRECTED )., Disp: 30 tablet, Rfl: 3;  digoxin  (LANOXIN) 0.125 MG tablet, Take 1 tablet (125 mcg total) by mouth daily., Disp: 90 tablet, Rfl: 3;  docusate sodium (COLACE) 100 MG capsule, Take 100 mg by mouth daily., Disp: , Rfl: ;  escitalopram (LEXAPRO) 10 MG tablet, Take 1 tablet (10 mg total) by mouth daily., Disp: 90 tablet, Rfl: 3 fish oil-omega-3 fatty acids 1000 MG capsule, Take 1 g by mouth daily. , Disp: , Rfl: ;  furosemide (LASIX) 40 MG tablet, Take 1 tablet (40 mg total) by mouth daily., Disp: 90 tablet, Rfl: 3;  glipiZIDE (GLUCOTROL) 10 MG tablet, Take 1 tablet (10 mg total) by mouth 2 (two) times daily before a meal., Disp: 270 tablet, Rfl: 3;  glucose blood (ACCU-CHEK COMFORT CURVE) test strip, Use as instructed, Disp: 100 each, Rfl: 11 insulin detemir (LEVEMIR FLEXPEN) 100 UNIT/ML injection, Daily at bedtime 14 UNITS OR AS DIRECTED  With needles NANO 4 MM, Disp: 15 mL, Rfl: 12;  ipratropium (ATROVENT) 0.03 % nasal spray, Place 2 sprays into the nose 2 (two) times daily., Disp: , Rfl: ;  metFORMIN (GLUCOPHAGE) 1000 MG tablet, Take 1 tablet (1,000 mg total) by mouth 2 (two) times daily with a meal., Disp: 45  tablet, Rfl: 3 niacin (NIASPAN) 1000 MG CR tablet, Take 1 tablet (1,000 mg total) by mouth at bedtime., Disp: 90 tablet, Rfl: 3;  oxyCODONE-acetaminophen (PERCOCET/ROXICET) 5-325 MG per tablet, Take 2 tablets by mouth every 4 (four) hours as needed for pain., Disp: 6 tablet, Rfl: 0;  pramipexole (MIRAPEX) 0.5 MG tablet, Take 1 tablet (0.5 mg total) by mouth daily. For restless legs, Disp: 90 tablet, Rfl: 3 ramipril (ALTACE) 5 MG capsule, Take 1 capsule (5 mg total) by mouth daily., Disp: 90 capsule, Rfl: 3;  saxagliptin HCl (ONGLYZA) 5 MG TABS tablet, Take 1 tablet (5 mg total) by mouth daily., Disp: 90 tablet, Rfl: 3;  spironolactone (ALDACTONE) 25 MG tablet, Take 1 tablet (25 mg total) by mouth daily., Disp: 90 tablet, Rfl: 3;  Tamsulosin HCl (FLOMAX) 0.4 MG CAPS, Take 1 capsule (0.4 mg total) by mouth daily., Disp: 90 capsule, Rfl:  3 tiZANidine (ZANAFLEX) 2 MG tablet, Take 1 tablet (2 mg total) by mouth every 8 (eight) hours as needed (back spasm)., Disp: 30 tablet, Rfl: 1  EXAM:  Filed Vitals:   03/24/12 1009  BP: 122/84  Pulse: 69  Temp: 98.3 F (36.8 C)    There is no height on file to calculate BMI.  GENERAL: vitals reviewed and listed above, alert, oriented, appears well hydrated and in no acute distress  SKIN: aprox 1.2cm healing lac on L hand over 2nd MCP joint - appears 3 sutures cut through skin and red granulation tissue in small gap at this point, no signs of infection  MS: moves all extremities without noticeable abnormality  PSYCH: pleasant and cooperative, no obvious depression or anxiety  ASSESSMENT AND PLAN:  Discussed the following assessment and plan:  1. Laceration of skin of hand    -sutures tore through very thin skin, no signs of infection -after informed consent, removed sutures, debrided dead skin from area, cleaned with NS, closed with 3 small steri strips and bandage applied -care and return instructions given -Patient advised to return or notify a doctor immediately if symptoms worsen or persist or new concerns arise.  Patient Instructions  -wear splint for 1 week to avoid moving this area  -keep clean and dry  -see a doctor if drainage, redness, pain or swelling  -follow up in 1 week with your doctor      [name].

## 2012-04-07 ENCOUNTER — Ambulatory Visit (INDEPENDENT_AMBULATORY_CARE_PROVIDER_SITE_OTHER): Payer: Medicare Other | Admitting: *Deleted

## 2012-04-07 DIAGNOSIS — I428 Other cardiomyopathies: Secondary | ICD-10-CM

## 2012-04-08 ENCOUNTER — Encounter: Payer: Self-pay | Admitting: *Deleted

## 2012-04-10 ENCOUNTER — Encounter: Payer: Self-pay | Admitting: Internal Medicine

## 2012-04-10 DIAGNOSIS — I428 Other cardiomyopathies: Secondary | ICD-10-CM | POA: Diagnosis not present

## 2012-04-10 LAB — REMOTE ICD DEVICE
BRDY-0002RV: 40 {beats}/min
RV LEAD AMPLITUDE: 12 mv
TZAT-0001FASTVT: 1
TZAT-0001SLOWVT: 1
TZAT-0012SLOWVT: 200 ms
TZAT-0013SLOWVT: 3
TZAT-0018FASTVT: NEGATIVE
TZAT-0019SLOWVT: 7.5 V
TZAT-0020SLOWVT: 1 ms
TZON-0004SLOWVT: 40
TZON-0005SLOWVT: 6
TZON-0008FASTVT: 40 ms
TZON-0010FASTVT: 80 ms
TZST-0001FASTVT: 2
TZST-0001FASTVT: 5
TZST-0001SLOWVT: 4
TZST-0003FASTVT: 25 J
TZST-0003FASTVT: 36 J
TZST-0003SLOWVT: 25 J
TZST-0003SLOWVT: 36 J
VENTRICULAR PACING ICD: 1 pct

## 2012-04-15 ENCOUNTER — Encounter: Payer: Self-pay | Admitting: *Deleted

## 2012-05-12 ENCOUNTER — Other Ambulatory Visit: Payer: Self-pay | Admitting: Internal Medicine

## 2012-05-12 ENCOUNTER — Ambulatory Visit (INDEPENDENT_AMBULATORY_CARE_PROVIDER_SITE_OTHER): Payer: Medicare Other | Admitting: *Deleted

## 2012-05-12 ENCOUNTER — Other Ambulatory Visit: Payer: Self-pay

## 2012-05-12 DIAGNOSIS — I428 Other cardiomyopathies: Secondary | ICD-10-CM

## 2012-05-14 ENCOUNTER — Other Ambulatory Visit: Payer: Self-pay | Admitting: Family Medicine

## 2012-05-14 ENCOUNTER — Telehealth: Payer: Self-pay | Admitting: Family Medicine

## 2012-05-14 MED ORDER — CLONAZEPAM 1 MG PO TABS: 1.0000 mg | ORAL_TABLET | Freq: Every evening | ORAL | Status: DC | PRN

## 2012-05-14 NOTE — Telephone Encounter (Signed)
This patient is requesting a refill. Last filled on 01/07/13 #30 with 3 additional refills.  Last seen for chronic conditions on the same day.  No follow up scheduled.  Please advise.  Thanks!!

## 2012-05-14 NOTE — Telephone Encounter (Signed)
No sent to Dr. Sarajane Jews as [facility] is out of the office this week.

## 2012-05-14 NOTE — Telephone Encounter (Signed)
Call in #[phone] with no rf  

## 2012-05-14 NOTE — Telephone Encounter (Signed)
Called and left on voicemail.

## 2012-05-15 LAB — REMOTE ICD DEVICE
BRDY-0002RV: 40 {beats}/min
DEVICE MODEL ICD: 255313
TZAT-0001FASTVT: 1
TZAT-0004FASTVT: 8
TZAT-0012FASTVT: 200 ms
TZAT-0013SLOWVT: 3
TZAT-0018SLOWVT: NEGATIVE
TZAT-0019SLOWVT: 7.5 V
TZAT-0020FASTVT: 1 ms
TZAT-0020SLOWVT: 1 ms
TZON-0003SLOWVT: 375 ms
TZON-0004SLOWVT: 40
TZON-0005FASTVT: 6
TZON-0005SLOWVT: 6
TZON-0008SLOWVT: 40 ms
TZON-0010FASTVT: 80 ms
TZON-0010SLOWVT: 80 ms
TZST-0001FASTVT: 4
TZST-0001SLOWVT: 2
TZST-0003FASTVT: 25 J
TZST-0003FASTVT: 36 J
TZST-0003FASTVT: 36 J
TZST-0003SLOWVT: 25 J
TZST-0003SLOWVT: 36 J

## 2012-05-27 ENCOUNTER — Encounter: Payer: Self-pay | Admitting: *Deleted

## 2012-05-29 ENCOUNTER — Encounter: Payer: Self-pay | Admitting: Internal Medicine

## 2012-06-02 ENCOUNTER — Telehealth: Payer: Self-pay | Admitting: Internal Medicine

## 2012-06-02 NOTE — Telephone Encounter (Signed)
Pt and his wife came in last year on 08/14/11 and both had physicals. Pt would like to do that again this year, but no appt available. They will come in separate in they have to. But still none available except well child. Pls advise.

## 2012-06-05 NOTE — Telephone Encounter (Signed)
May 28th is wide open  Why cant they come in that day?

## 2012-06-06 NOTE — Telephone Encounter (Signed)
appt set/kh 

## 2012-06-16 ENCOUNTER — Other Ambulatory Visit: Payer: Self-pay

## 2012-06-16 ENCOUNTER — Ambulatory Visit (INDEPENDENT_AMBULATORY_CARE_PROVIDER_SITE_OTHER): Payer: Medicare Other | Admitting: *Deleted

## 2012-06-16 ENCOUNTER — Encounter: Payer: Self-pay | Admitting: Internal Medicine

## 2012-06-16 DIAGNOSIS — I428 Other cardiomyopathies: Secondary | ICD-10-CM

## 2012-06-18 LAB — REMOTE ICD DEVICE
BRDY-0002RV: 40 {beats}/min
DEV-0020ICD: NEGATIVE
RV LEAD AMPLITUDE: 12 mv
TZAT-0001SLOWVT: 1
TZAT-0004FASTVT: 8
TZAT-0012SLOWVT: 200 ms
TZAT-0018FASTVT: NEGATIVE
TZAT-0019SLOWVT: 7.5 V
TZAT-0020SLOWVT: 1 ms
TZON-0003FASTVT: 300 ms
TZON-0004SLOWVT: 40
TZON-0005SLOWVT: 6
TZON-0008FASTVT: 40 ms
TZON-0008SLOWVT: 40 ms
TZON-0010FASTVT: 80 ms
TZON-0010SLOWVT: 80 ms
TZST-0001FASTVT: 2
TZST-0001FASTVT: 3
TZST-0001FASTVT: 5
TZST-0001SLOWVT: 2
TZST-0001SLOWVT: 4
TZST-0003FASTVT: 36 J
TZST-0003SLOWVT: 15 J
TZST-0003SLOWVT: 36 J
VENTRICULAR PACING ICD: 1 pct

## 2012-06-23 ENCOUNTER — Ambulatory Visit (INDEPENDENT_AMBULATORY_CARE_PROVIDER_SITE_OTHER): Payer: Medicare Other | Admitting: Internal Medicine

## 2012-06-23 VITALS — BP 130/50 | HR 63 | Temp 98.4°F | Wt 204.0 lb

## 2012-06-23 DIAGNOSIS — I10 Essential (primary) hypertension: Secondary | ICD-10-CM

## 2012-06-23 DIAGNOSIS — E1140 Type 2 diabetes mellitus with diabetic neuropathy, unspecified: Secondary | ICD-10-CM

## 2012-06-23 DIAGNOSIS — R259 Unspecified abnormal involuntary movements: Secondary | ICD-10-CM

## 2012-06-23 DIAGNOSIS — R258 Other abnormal involuntary movements: Secondary | ICD-10-CM | POA: Insufficient documentation

## 2012-06-23 DIAGNOSIS — G479 Sleep disorder, unspecified: Secondary | ICD-10-CM

## 2012-06-23 DIAGNOSIS — E1142 Type 2 diabetes mellitus with diabetic polyneuropathy: Secondary | ICD-10-CM

## 2012-06-23 DIAGNOSIS — N289 Disorder of kidney and ureter, unspecified: Secondary | ICD-10-CM

## 2012-06-23 DIAGNOSIS — E1149 Type 2 diabetes mellitus with other diabetic neurological complication: Secondary | ICD-10-CM | POA: Diagnosis not present

## 2012-06-23 DIAGNOSIS — E785 Hyperlipidemia, unspecified: Secondary | ICD-10-CM

## 2012-06-23 DIAGNOSIS — I2510 Atherosclerotic heart disease of native coronary artery without angina pectoris: Secondary | ICD-10-CM | POA: Diagnosis not present

## 2012-06-23 DIAGNOSIS — G2581 Restless legs syndrome: Secondary | ICD-10-CM

## 2012-06-23 DIAGNOSIS — I5022 Chronic systolic (congestive) heart failure: Secondary | ICD-10-CM

## 2012-06-23 MED ORDER — CLONAZEPAM 1 MG PO TABS: 1.0000 mg | ORAL_TABLET | Freq: Every evening | ORAL | Status: DC | PRN

## 2012-06-23 MED ORDER — PRAMIPEXOLE DIHYDROCHLORIDE 0.5 MG PO TABS: 0.5000 mg | ORAL_TABLET | Freq: Every day | ORAL | Status: DC

## 2012-06-23 MED ORDER — SAXAGLIPTIN HCL 5 MG PO TABS: 5.0000 mg | ORAL_TABLET | Freq: Every day | ORAL | Status: DC

## 2012-06-23 NOTE — Progress Notes (Signed)
Chief Complaint  Patient presents with  . Legs jerking at night    Needs refills of Klonopin, Mirapex and Onglyza.  . Numbness    HPI: Patient comes in today for SDA for problem evaluation.  He is concerned about medication for his leg shaking. It turns out his regular yearly visit appointment cut changed to a month later and he is going to run out of his medications. And because of this problem appointment was made for him to come in. He is having continued shaking jerking both legs at night usually about an hour after falling asleep and also getting feet numbness. He states it doesn't happen in the day he has no falling with this. His foot is non-and he stands up and shakes each foot 50 times and goes back to sleep. He takes Requip a half a milligram at night and Klonopin 1 mg at night when needed.. he is trying not to use this was on Valium at some point.    Blood sugars he states are pretty good and denies any hypoglycemia.   He states his arteries have been checked out and are okay.   He denies severe pain in his back this associated. He denies new onset weakness. ROS: See pertinent positives and negatives per HPI.  Past Medical History  Diagnosis Date  . ARTHRITIS, HIP   . Autoimmune hemolytic anemias   . CEREBROVASCULAR DISEASE   . Ischemic cardiomyopathy     S/P CABG; EF 201-25% 11/2009  . ICD (implantable cardiac defibrillator) in place     [facility]--implanted 2005  . DIABETES MELLITUS, TYPE II   . Enlargement of lymph nodes   . HYPERLIPIDEMIA   . HYPERTENSION   . HYPOTHYROIDISM   . LESION, FACE   . Nocturia   . RESTLESS LEG SYNDROME   . UNSPECIFIED ANEMIA   . VITAMIN D DEFICIENCY   . WEIGHT LOSS   . OBSTRUCTIVE SLEEP APNEA 07/19/2009    Family History  Problem Relation Age of Onset  . COPD Father     History   Social History  . Marital Status: Married    Spouse Name: N/A    Number of Children: N/A  . Years of Education: N/A   Social History Main  Topics  . Smoking status: Former Smoker -- 1.00 packs/day for 40 years    Types: Cigarettes    Quit date: 03/19/1978  . Smokeless tobacco: Current User  . Alcohol Use: No  . Drug Use: No  . Sexually Active: No   Other Topics Concern  . Not on file   Social History Narrative   hhof 2 married   No pets     In [place] for over 60 years.       Retired Education officer, museum.    Outpatient Encounter Prescriptions as of 06/23/2012  Medication Sig Dispense Refill  . aspirin EC 81 MG EC tablet Take 81 mg by mouth daily.        [name] atorvastatin (LIPITOR) 40 MG tablet Take 1 tablet (40 mg total) by mouth daily.  90 tablet  3  . carvedilol (COREG) 12.5 MG tablet Take 1 tablet (12.5 mg total) by mouth 2 (two) times daily with a meal.  180 tablet  3  . clonazePAM (KLONOPIN) 1 MG tablet Take 1 tablet (1 mg total) by mouth at bedtime as needed (OR AS DIRECTED ).  30 tablet  2  . digoxin (LANOXIN) 0.125 MG tablet Take 1 tablet (125 mcg  total) by mouth daily.  90 tablet  3  . docusate sodium (COLACE) 100 MG capsule Take 100 mg by mouth daily.      [name] escitalopram (LEXAPRO) 10 MG tablet Take 1 tablet (10 mg total) by mouth daily.  90 tablet  3  . fish oil-omega-3 fatty acids 1000 MG capsule Take 1 g by mouth daily.       . furosemide (LASIX) 40 MG tablet Take 1 tablet (40 mg total) by mouth daily.  90 tablet  3  . glipiZIDE (GLUCOTROL) 10 MG tablet Take 1 tablet (10 mg total) by mouth 2 (two) times daily before a meal.  270 tablet  3  . glucose blood (ACCU-CHEK COMFORT CURVE) test strip Use as instructed  100 each  11  . insulin detemir (LEVEMIR FLEXPEN) 100 UNIT/ML injection Daily at bedtime 14 UNITS OR AS DIRECTED  With needles NANO 4 MM  15 mL  12  . ipratropium (ATROVENT) 0.03 % nasal spray Place 2 sprays into the nose 2 (two) times daily.      . metFORMIN (GLUCOPHAGE) 1000 MG tablet Take 1 tablet (1,000 mg total) by mouth 2 (two) times daily with a meal.  45 tablet  3  . niacin (NIASPAN) 1000 MG  CR tablet Take 1 tablet (1,000 mg total) by mouth at bedtime.  90 tablet  3  . oxyCODONE-acetaminophen (PERCOCET/ROXICET) 5-325 MG per tablet Take 2 tablets by mouth every 4 (four) hours as needed for pain.  6 tablet  0  . pramipexole (MIRAPEX) 0.5 MG tablet Take 1 tablet (0.5 mg total) by mouth daily. For restless legs  90 tablet  1  . ramipril (ALTACE) 5 MG capsule Take 1 capsule (5 mg total) by mouth daily.  90 capsule  3  . saxagliptin HCl (ONGLYZA) 5 MG TABS tablet Take 1 tablet (5 mg total) by mouth daily.  90 tablet  1  . spironolactone (ALDACTONE) 25 MG tablet Take 1 tablet (25 mg total) by mouth daily.  90 tablet  3  . Tamsulosin HCl (FLOMAX) 0.4 MG CAPS Take 1 capsule (0.4 mg total) by mouth daily.  90 capsule  3  . tiZANidine (ZANAFLEX) 2 MG tablet Take 1 tablet (2 mg total) by mouth every 8 (eight) hours as needed (back spasm).  30 tablet  1  . [DISCONTINUED] clonazePAM (KLONOPIN) 1 MG tablet Take 1 tablet (1 mg total) by mouth at bedtime as needed (OR AS DIRECTED ).  30 tablet  0  . [DISCONTINUED] pramipexole (MIRAPEX) 0.5 MG tablet Take 1 tablet (0.5 mg total) by mouth daily. For restless legs  90 tablet  3  . [DISCONTINUED] saxagliptin HCl (ONGLYZA) 5 MG TABS tablet Take 1 tablet (5 mg total) by mouth daily.  90 tablet  3   No facility-administered encounter medications on file as of 06/23/2012.    EXAM:  BP 130/50  Pulse 63  Temp(Src) 98.4 F (36.9 C) (Oral)  Wt 204 lb (92.534 kg)  BMI 26.18 kg/m2  SpO2 99%  Body mass index is 26.18 kg/(m^2).  GENERAL: vitals reviewed and listed above, alert, oriented, appears well hydrated and in no acute distress wearing his hearing aids  HEENT: atraumatic, conjunctiva  clear, no obvious abnormalities on inspection of external nose and ears  NECK: no obvious masses on inspection palpation   LUNGS: clear to auscultation bilaterally, no wheezes, rales or rhonchi, good air movement  CV: HRRR, no clubbing cyanosis or  peripheral edema nl  cap refill  Abdomen soft without organomegaly MS: moves all extremities without noticeable focal  Abnormality  his gait is  equal. He can raise up on his toes and raise up on his heels. Neurologic no obvious cogwheeling or tremor no fasciculation is seen reflexes hard to elicit ?  one beat clonus on the left ankle but was not consistent. No ulcers or calluses on his feet. PSYCH: pleasant and cooperative, no obvious depression or anxiety  ASSESSMENT AND PLAN:  Discussed the following assessment and plan:  Nocturnal leg movements - Presumed restless leg or by clonus uncertain if this is worse or he just needs refills. Slept well the last 2 nights consider consult about medicines  Diabetic neuropathy, type II diabetes mellitus - denies hypoglycemia   HYPERTENSION It is unclear to me if things are worse or not  have to review his record we'll rearrange his wellness visit and get labs at a time to include IBC ferritin  . b12  a1c etc.  -Patient advised to return or notify health care team  if symptoms worsen or persist or new concerns arise.  Patient Instructions  Will refill medications today. i would l;ike  You to get labs before your  Visit in July   If needed we can move up your appt.  This way . I will put in orders .   I will review your record to see if there are other options for your symptoms but I'm glad you have slept the last 2 nights.  Contact us immediately if you're getting weakness in  legs and falling.     Standley Brooking. Panosh M.D.

## 2012-06-23 NOTE — Patient Instructions (Addendum)
Will refill medications today. i would l;ike  You to get labs before your  Visit in July   If needed we can move up your appt.  This way . I will put in orders .   I will review your record to see if there are other options for your symptoms but I'm glad you have slept the last 2 nights.  Contact us immediately if you're getting weakness in  legs and falling.

## 2012-06-24 DIAGNOSIS — H6120 Impacted cerumen, unspecified ear: Secondary | ICD-10-CM | POA: Diagnosis not present

## 2012-06-24 DIAGNOSIS — H9110 Presbycusis, unspecified ear: Secondary | ICD-10-CM | POA: Diagnosis not present

## 2012-06-24 DIAGNOSIS — M7710 Lateral epicondylitis, unspecified elbow: Secondary | ICD-10-CM | POA: Diagnosis not present

## 2012-06-26 ENCOUNTER — Encounter: Payer: Self-pay | Admitting: Internal Medicine

## 2012-07-14 DIAGNOSIS — H353190 Nonexudative age-related macular degeneration, unspecified eye, stage unspecified: Secondary | ICD-10-CM | POA: Diagnosis not present

## 2012-07-14 DIAGNOSIS — H35039 Hypertensive retinopathy, unspecified eye: Secondary | ICD-10-CM | POA: Diagnosis not present

## 2012-07-14 DIAGNOSIS — H26499 Other secondary cataract, unspecified eye: Secondary | ICD-10-CM | POA: Diagnosis not present

## 2012-07-14 DIAGNOSIS — E119 Type 2 diabetes mellitus without complications: Secondary | ICD-10-CM | POA: Diagnosis not present

## 2012-07-21 ENCOUNTER — Encounter: Payer: Medicare Other | Admitting: *Deleted

## 2012-07-22 ENCOUNTER — Encounter: Payer: Self-pay | Admitting: *Deleted

## 2012-07-28 ENCOUNTER — Encounter: Payer: Self-pay | Admitting: Pulmonary Disease

## 2012-07-28 ENCOUNTER — Ambulatory Visit (INDEPENDENT_AMBULATORY_CARE_PROVIDER_SITE_OTHER): Payer: Medicare Other | Admitting: Pulmonary Disease

## 2012-07-28 VITALS — BP 110/62 | HR 62 | Ht 74.0 in | Wt 203.8 lb

## 2012-07-28 DIAGNOSIS — R258 Other abnormal involuntary movements: Secondary | ICD-10-CM

## 2012-07-28 DIAGNOSIS — R259 Unspecified abnormal involuntary movements: Secondary | ICD-10-CM | POA: Diagnosis not present

## 2012-07-28 DIAGNOSIS — G4733 Obstructive sleep apnea (adult) (pediatric): Secondary | ICD-10-CM

## 2012-07-28 NOTE — Assessment & Plan Note (Signed)
He reports compliance with therapy and benefit from CPAP.  Will get his CPAP download and call him with results.

## 2012-07-28 NOTE — Progress Notes (Signed)
Chief Complaint  Patient presents with  . Follow-up    Pt states he wears his CPAP everynight x 8-9 hrs a night. Denies any problems with mask/machine. Pt states the pressure settings still feels like it is sufficient. Pt states his left leg goes numb everynight and it jerks at night.     History of Present Illness: [name] is a 79 y.o. male with OSA.  He was last seen in 2012.  He has been using CPAP and this is working well.  He has full face mask, and no problems with mask fit.  His main concern is with left leg numbness.  This occurs about 30 minutes to 1 hour after he goes to bed.  This causes trouble with him falling asleep.  He will get up and shakes his leg about 30 to 40 times before he feels okay in his leg.  He can then go back to sleep.  This does not happen during the day.  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  [name]  has past surgical history that includes Cataract extraction; Cholecystectomy; Cardiac defibrillator placement; Ventricular resection / repair aneurysm; and cabg.  Prior to Admission medications   Medication Sig Start Date End Date Taking? Authorizing Provider  aspirin EC 81 MG EC tablet Take 81 mg by mouth daily.     Yes Historical Provider, MD  atorvastatin (LIPITOR) 40 MG tablet Take 1 tablet (40 mg total) by mouth daily. 11/26/11  Yes Lelon Perla, MD  carvedilol (COREG) 12.5 MG tablet Take 1 tablet (12.5 mg total) by mouth 2 (two) times daily with a meal. 11/26/11  Yes Lelon Perla, MD  clonazePAM  (KLONOPIN) 1 MG tablet Take 1 tablet (1 mg total) by mouth at bedtime as needed (OR AS DIRECTED ). 06/23/12  Yes Burnis Medin, MD  digoxin (LANOXIN) 0.125 MG tablet Take 1 tablet (125 mcg total) by mouth daily. 11/26/11  Yes Lelon Perla, MD  docusate sodium (COLACE) 100 MG capsule Take 100 mg by mouth daily.   Yes Historical Provider, MD  escitalopram (LEXAPRO) 10 MG tablet Take 1 tablet (10 mg total) by mouth daily. 08/16/11  Yes Burnis Medin, MD  fish oil-omega-3 fatty acids 1000 MG capsule Take 1 g by mouth daily.    Yes Historical Provider, MD  furosemide (LASIX) 40 MG tablet Take 1 tablet (40 mg total) by mouth daily. 11/26/11  Yes Lelon Perla, MD  glipiZIDE (GLUCOTROL) 10 MG tablet Take 1 tablet (10 mg total) by mouth 2 (two) times daily before a meal. 08/16/11  Yes Burnis Medin, MD  glucose blood (ACCU-CHEK COMFORT CURVE) test strip Use as instructed 03/17/12  Yes Burnis Medin, MD  insulin detemir (LEVEMIR FLEXPEN) 100 UNIT/ML injection Daily at bedtime 14 UNITS OR AS DIRECTED  With needles NANO 4 MM 01/08/12  Yes Burnis Medin, MD  ipratropium (ATROVENT) 0.03 % nasal spray Place 2 sprays into the nose 2 (two) times daily.   Yes Historical Provider, MD  metFORMIN (GLUCOPHAGE) 1000 MG tablet Take 1 tablet (1,000 mg total) by mouth 2 (two) times daily with a meal. 08/14/11  Yes Burnis Medin, MD  niacin (NIASPAN) 1000 MG CR tablet Take 1 tablet (1,000 mg total) by mouth at bedtime. 11/26/11  Yes Lelon Perla, MD  OVER THE COUNTER MEDICATION benfotiamine 300 mg once a day   Yes Historical Provider, MD  oxyCODONE-acetaminophen (PERCOCET/ROXICET) 5-325 MG per tablet Take 2 tablets by mouth every 4 (four) hours as needed for pain. 03/13/12  Yes Julieta Bellini, NP  pramipexole (MIRAPEX) 0.5 MG tablet Take 1 tablet (0.5 mg total) by mouth daily. For restless legs 06/23/12  Yes Burnis Medin, MD  ramipril (ALTACE) 5 MG capsule Take 1 capsule (5 mg total) by mouth daily. 11/26/11  Yes Lelon Perla, MD  saxagliptin HCl (ONGLYZA) 5 MG TABS tablet Take 1 tablet (5 mg total) by mouth daily. 06/23/12  Yes Burnis Medin, MD  spironolactone (ALDACTONE) 25 MG tablet Take 1 tablet (25 mg total) by mouth daily. 11/26/11  Yes Lelon Perla, MD  Tamsulosin HCl (FLOMAX) 0.4 MG CAPS Take 1 capsule (0.4 mg total) by mouth daily. 08/16/11  Yes Burnis Medin, MD  tiZANidine (ZANAFLEX) 2 MG tablet Take 1 tablet (2 mg total) by mouth every 8 (eight) hours as needed (back spasm). 01/08/12  Yes Burnis Medin, MD    No Known Allergies   Physical Exam:  General - No distress ENT - No sinus tenderness, no oral exudate, no LAN Cardiac - s1s2 regular, no murmur Chest - No wheeze/rales/dullness Back - No focal tenderness Abd - Soft, non-tender Ext - No edema Neuro - Normal strength, CN intact, no sensory deficit appreciated Skin - No rashes Psych - normal mood, and behavior   Assessment/Plan:  Chesley Mires, MD [place] [facility]/Sleep Pager:  [phone]

## 2012-07-28 NOTE — Patient Instructions (Signed)
Will get report from CPAP machine and call with results Follow up in 1 year 

## 2012-07-28 NOTE — Assessment & Plan Note (Signed)
His symptom description is suggestive of progression of his restless legs.  This has been managed by his PCP.  Plan was to have lab work prior to his [place] in July with PCP.  Advised him to d/w Dr. Regis Bill about whether he should have lab work done sooner to further assess.

## 2012-08-14 ENCOUNTER — Encounter: Payer: Medicare Other | Admitting: Internal Medicine

## 2012-08-14 DIAGNOSIS — L570 Actinic keratosis: Secondary | ICD-10-CM | POA: Diagnosis not present

## 2012-08-14 DIAGNOSIS — Z85828 Personal history of other malignant neoplasm of skin: Secondary | ICD-10-CM | POA: Diagnosis not present

## 2012-08-19 ENCOUNTER — Encounter: Payer: Medicare Other | Admitting: Internal Medicine

## 2012-08-20 ENCOUNTER — Encounter: Payer: Medicare Other | Admitting: Internal Medicine

## 2012-08-27 ENCOUNTER — Other Ambulatory Visit (INDEPENDENT_AMBULATORY_CARE_PROVIDER_SITE_OTHER): Payer: Medicare Other

## 2012-08-27 DIAGNOSIS — E1142 Type 2 diabetes mellitus with diabetic polyneuropathy: Secondary | ICD-10-CM

## 2012-08-27 DIAGNOSIS — G2581 Restless legs syndrome: Secondary | ICD-10-CM | POA: Diagnosis not present

## 2012-08-27 DIAGNOSIS — E1149 Type 2 diabetes mellitus with other diabetic neurological complication: Secondary | ICD-10-CM

## 2012-08-27 DIAGNOSIS — E1140 Type 2 diabetes mellitus with diabetic neuropathy, unspecified: Secondary | ICD-10-CM

## 2012-08-27 DIAGNOSIS — I10 Essential (primary) hypertension: Secondary | ICD-10-CM | POA: Diagnosis not present

## 2012-08-27 DIAGNOSIS — N289 Disorder of kidney and ureter, unspecified: Secondary | ICD-10-CM

## 2012-08-27 DIAGNOSIS — E785 Hyperlipidemia, unspecified: Secondary | ICD-10-CM

## 2012-08-27 DIAGNOSIS — I5022 Chronic systolic (congestive) heart failure: Secondary | ICD-10-CM

## 2012-08-27 DIAGNOSIS — R258 Other abnormal involuntary movements: Secondary | ICD-10-CM

## 2012-08-27 DIAGNOSIS — G479 Sleep disorder, unspecified: Secondary | ICD-10-CM

## 2012-08-27 DIAGNOSIS — R259 Unspecified abnormal involuntary movements: Secondary | ICD-10-CM | POA: Diagnosis not present

## 2012-08-27 DIAGNOSIS — I2510 Atherosclerotic heart disease of native coronary artery without angina pectoris: Secondary | ICD-10-CM

## 2012-08-27 LAB — BASIC METABOLIC PANEL
Chloride: 106 mEq/L (ref 96–112)
GFR: 49.89 mL/min — ABNORMAL LOW (ref >60.00)
Glucose, Bld: 178 mg/dL — ABNORMAL HIGH (ref 70–99)
Potassium: 4.7 mEq/L (ref 3.5–5.1)
Sodium: 141 mEq/L (ref 135–145)

## 2012-08-27 LAB — IBC PANEL: Transferrin: 332.5 mg/dL (ref 212.0–360.0)

## 2012-08-27 LAB — CBC WITH DIFFERENTIAL/PLATELET
Eosinophils Relative: 1.6 % (ref 0.0–5.0)
HCT: 37.3 % — ABNORMAL LOW (ref 39.0–52.0)
Hemoglobin: 12.1 g/dL — ABNORMAL LOW (ref 13.0–17.0)
Lymphs Abs: 2.2 10*3/uL (ref 0.7–4.0)
Monocytes Relative: 7 % (ref 3.0–12.0)
Neutro Abs: 8.5 10*3/uL — ABNORMAL HIGH (ref 1.4–7.7)
WBC: 11.7 10*3/uL — ABNORMAL HIGH (ref 4.5–10.5)

## 2012-08-27 LAB — HEPATIC FUNCTION PANEL
ALT: 17 U/L (ref 0–53)
AST: 19 U/L (ref 0–37)
Albumin: 3.5 g/dL (ref 3.5–5.2)
Alkaline Phosphatase: 44 U/L (ref 39–117)
Total Protein: 6.4 g/dL (ref 6.0–8.3)

## 2012-08-27 LAB — CK: Total CK: 62 U/L (ref 7–232)

## 2012-08-27 LAB — LIPID PANEL: LDL Cholesterol: 17 mg/dL (ref 0–99)

## 2012-09-03 ENCOUNTER — Other Ambulatory Visit: Payer: Self-pay | Admitting: Family Medicine

## 2012-09-03 ENCOUNTER — Ambulatory Visit (INDEPENDENT_AMBULATORY_CARE_PROVIDER_SITE_OTHER): Payer: Medicare Other | Admitting: Internal Medicine

## 2012-09-03 ENCOUNTER — Encounter: Payer: Self-pay | Admitting: Internal Medicine

## 2012-09-03 VITALS — BP 126/60 | HR 60 | Temp 98.2°F | Ht 71.5 in | Wt 205.0 lb

## 2012-09-03 DIAGNOSIS — E785 Hyperlipidemia, unspecified: Secondary | ICD-10-CM

## 2012-09-03 DIAGNOSIS — I10 Essential (primary) hypertension: Secondary | ICD-10-CM | POA: Diagnosis not present

## 2012-09-03 DIAGNOSIS — G479 Sleep disorder, unspecified: Secondary | ICD-10-CM

## 2012-09-03 DIAGNOSIS — I2510 Atherosclerotic heart disease of native coronary artery without angina pectoris: Secondary | ICD-10-CM | POA: Diagnosis not present

## 2012-09-03 DIAGNOSIS — E119 Type 2 diabetes mellitus without complications: Secondary | ICD-10-CM

## 2012-09-03 DIAGNOSIS — E1149 Type 2 diabetes mellitus with other diabetic neurological complication: Secondary | ICD-10-CM

## 2012-09-03 DIAGNOSIS — Z789 Other specified health status: Secondary | ICD-10-CM

## 2012-09-03 DIAGNOSIS — E1142 Type 2 diabetes mellitus with diabetic polyneuropathy: Secondary | ICD-10-CM

## 2012-09-03 DIAGNOSIS — N289 Disorder of kidney and ureter, unspecified: Secondary | ICD-10-CM

## 2012-09-03 DIAGNOSIS — G2581 Restless legs syndrome: Secondary | ICD-10-CM | POA: Diagnosis not present

## 2012-09-03 DIAGNOSIS — Z0000 Encounter for general adult medical examination without abnormal findings: Secondary | ICD-10-CM | POA: Diagnosis not present

## 2012-09-03 DIAGNOSIS — E1140 Type 2 diabetes mellitus with diabetic neuropathy, unspecified: Secondary | ICD-10-CM

## 2012-09-03 DIAGNOSIS — I5022 Chronic systolic (congestive) heart failure: Secondary | ICD-10-CM

## 2012-09-03 DIAGNOSIS — D509 Iron deficiency anemia, unspecified: Secondary | ICD-10-CM

## 2012-09-03 MED ORDER — INSULIN DETEMIR 100 UNIT/ML ~~LOC~~ SOLN: SUBCUTANEOUS | Status: DC

## 2012-09-03 MED ORDER — TAMSULOSIN HCL 0.4 MG PO CAPS: 0.4000 mg | ORAL_CAPSULE | Freq: Every day | ORAL | Status: DC

## 2012-09-03 MED ORDER — IPRATROPIUM BROMIDE 0.03 % NA SOLN: 2.0000 | Freq: Two times a day (BID) | NASAL | Status: DC

## 2012-09-03 MED ORDER — GLIPIZIDE 10 MG PO TABS: 10.0000 mg | ORAL_TABLET | Freq: Two times a day (BID) | ORAL | Status: DC

## 2012-09-03 MED ORDER — SAXAGLIPTIN HCL 5 MG PO TABS: 5.0000 mg | ORAL_TABLET | Freq: Every day | ORAL | Status: DC

## 2012-09-03 MED ORDER — ESCITALOPRAM OXALATE 10 MG PO TABS: 10.0000 mg | ORAL_TABLET | Freq: Every day | ORAL | Status: DC

## 2012-09-03 MED ORDER — GLUCOSE BLOOD VI STRP: ORAL_STRIP | Status: DC

## 2012-09-03 MED ORDER — ACCU-CHEK SOFT TOUCH LANCETS MISC: Status: DC

## 2012-09-03 MED ORDER — BENFOTIAMINE 150 MG PO CAPS: 150.0000 mg | ORAL_CAPSULE | Freq: Every day | ORAL | Status: DC

## 2012-09-03 MED ORDER — CLONAZEPAM 1 MG PO TABS: 1.0000 mg | ORAL_TABLET | Freq: Every evening | ORAL | Status: DC | PRN

## 2012-09-03 MED ORDER — PRAMIPEXOLE DIHYDROCHLORIDE 0.5 MG PO TABS: 0.5000 mg | ORAL_TABLET | Freq: Every day | ORAL | Status: DC

## 2012-09-03 MED ORDER — METFORMIN HCL 1000 MG PO TABS: 1000.0000 mg | ORAL_TABLET | Freq: Two times a day (BID) | ORAL | Status: DC

## 2012-09-03 NOTE — Patient Instructions (Addendum)
Your blood sugar has become more elevated. Agree with increasing her insulin and monitoring keeping her blood sugar below 180-160 range.  Check your feet every day take care of the area of that left foot if getting bigger or looking infected we will get a foot doctor to see you.  I'm glad you're restless leg is better with the new medication however your iron level is low which also makes her restless leg feel worse.  I would like you take an iron supplement  these are over-the-counter usually ferrous sulfate or ferrous gluconate take 1  at least once a day twice a day is better. If it doesn't bother your stomach.  We should recheck your iron levels and blood count and A1c level in about 3-4 months to see how this is working.  Contact us if you have any Evidence of blood in your stool or bleeding.  Caution take care when you were doing her housework and tasks to avoid falling and losing balance.     Iron-Rich Diet An iron-rich diet contains foods that are good sources of iron. Iron is an important mineral that helps your body produce hemoglobin. Hemoglobin is a protein in red blood cells that carries oxygen to the body's tissues. Sometimes, the iron level in your blood can be low. This may be caused by:  A lack of iron in your diet.  Blood loss.  Times of growth, such as during pregnancy or during a child's growth and development. Low levels of iron can cause a decrease in the number of red blood cells. This can result in iron deficiency anemia. Iron deficiency anemia symptoms include:  Tiredness.  Weakness.  Irritability.  Increased chance of infection. Here are some recommendations for daily iron intake:  Males older than 77 years of age need 8 mg of iron per day.  Women ages 45 to 23 need 18 mg of iron per day.  Pregnant women need 27 mg of iron per day, and women who are over 65 years of age and breastfeeding need 9 mg of iron per day.  Women over the age of 49 need 8 mg  of iron per day. SOURCES OF IRON There are 2 types of iron that are found in food: heme iron and nonheme iron. Heme iron is absorbed by the body better than nonheme iron. Heme iron is found in meat, poultry, and fish. Nonheme iron is found in grains, beans, and vegetables. Heme Iron Sources Food / Iron (mg)  Chicken liver, 3 oz (85 g)/ 10 mg  Beef liver, 3 oz (85 g)/ 5.5 mg  Oysters, 3 oz (85 g)/ 8 mg  Beef, 3 oz (85 g)/ 2 to 3 mg  Shrimp, 3 oz (85 g)/ 2.8 mg  [place], 3 oz (85 g)/ 2 mg  Chicken, 3 oz (85 g) / 1 mg  Fish (tuna, halibut), 3 oz (85 g)/ 1 mg  Pork, 3 oz (85 g)/ 0.9 mg Nonheme Iron Sources Food / Iron (mg)  Ready-to-eat breakfast cereal, iron-fortified / 3.9 to 7 mg  Tofu,  cup / 3.4 mg  Kidney beans,  cup / 2.6 mg  Baked potato with skin / 2.7 mg  Asparagus,  cup / 2.2 mg  Avocado / 2 mg  Dried peaches,  cup / 1.6 mg  Raisins,  cup / 1.5 mg  Soy milk, 1 cup / 1.5 mg  Whole-wheat bread, 1 slice / 1.2 mg  Spinach, 1 cup / 0.8 mg  Broccoli,  cup /  0.6 mg IRON ABSORPTION Certain foods can decrease the body's absorption of iron. Try to avoid these foods and beverages while eating meals with iron-containing foods:  Coffee.  Tea.  Fiber.  Soy. Foods containing vitamin C can help increase the amount of iron your body absorbs from iron sources, especially from nonheme sources. Eat foods with vitamin C along with iron-containing foods to increase your iron absorption. Foods that are high in vitamin C include many fruits and vegetables. Some good sources are:  Fresh orange juice.  Oranges.  Strawberries.  Mangoes.  Grapefruit.  Red bell peppers.  Green bell peppers.  Broccoli.  Potatoes with skin.  Tomato juice. Document Released: 10/17/2004 Document Revised: 05/28/2011 Document Reviewed: 08/24/2010 [facility] Patient Information 2014 [place], [place]. Diabetes and Foot Care Diabetes may cause you to have a poor blood supply  (circulation) to your legs and feet. Because of this, the skin may be thinner, break easier, and heal more slowly. You also may have nerve damage in your legs and feet causing decreased feeling. You may not notice minor injuries to your feet that could lead to serious problems or infections. Taking care of your feet is one of the most important things you can do for yourself.  HOME CARE INSTRUCTIONS  Do not go barefoot. Bare feet are easily injured.  Check your feet daily for blisters, cuts, and redness.  Wash your feet with warm water (not hot) and mild soap. Pat your feet and between your toes until completely dry.  Apply a moisturizing lotion that does not contain alcohol or petroleum jelly to the dry skin on your feet and to dry brittle toenails. Do not put it between your toes.  Trim your toenails straight across. Do not dig under them or around the cuticle.  Do not cut corns or calluses, or try to remove them with medicine.  Wear clean cotton socks or stockings every day. Make sure they are not too tight. Do not wear knee high stockings since they may decrease blood flow to your legs.  Wear leather shoes that fit properly and have enough cushioning. To break in new shoes, wear them just a few hours a day to avoid injuring your feet.  Wear shoes at all times, even in the house.  Do not cross your legs. This may decrease the blood flow to your feet.  If you find a minor scrape, cut, or break in the skin on your feet, keep it and the skin around it clean and dry. These areas may be cleansed with mild soap and water. Do not use peroxide, alcohol, iodine or Merthiolate.  When you remove an adhesive bandage, be sure not to harm the skin around it.  If you have a wound, look at it several times a day to make sure it is healing.  Do not use heating pads or hot water bottles. Burns can occur. If you have lost feeling in your feet or legs, you may not know it is happening until it is too  late.  Report any cuts, sores or bruises to your caregiver. Do not wait! SEEK MEDICAL CARE IF:   You have an injury that is not healing or you notice redness, numbness, burning, or tingling.  Your feet always feel cold.  You have pain or cramps in your legs and feet. SEEK IMMEDIATE MEDICAL CARE IF:   There is increasing redness, swelling, or increasing pain in the wound.  There is a red line that goes up your leg.  Pus is coming from a wound.  You develop an unexplained oral temperature above 102 F (38.9 C), or as your caregiver suggests.  You notice a bad smell coming from an ulcer or wound. MAKE SURE YOU:   Understand these instructions.  Will watch your condition.  Will get help right away if you are not doing well or get worse. Document Released: 03/02/2000 Document Revised: 05/28/2011 Document Reviewed: 09/08/2008 [facility] Patient Information 2014 [place], [place].

## 2012-09-03 NOTE — Progress Notes (Signed)
Chief Complaint  Patient presents with  . Medicare Wellness    medication refills  . Diabetes    HPI: Patient comes in today for Preventive Medicare wellness visit . Just had check up at the [place]  In may and had "extensive CPX "  was told to go up on his insulin Levemir by 4 units. His sugars have been on the higher side 180-200. Denies any low blood sugars passing out. Eating fairly well and could do better. He has a copy of laboratories that were done a month ago and was told that he might need to take a little higher but to followup with [place]. He has no active bleeding blood in his stool stomach ulcer symptoms No major injuries, ed visits ,hospitalizations ,  Restless leg at night seems to be increasing has seen a pulmonary doctor for his sleep apnea and advised to followup with primary care a bout has restless leg.  Wife has gotten a new medicine called BENFOTIAMINE 300 mg at night   and he states this seems to help him sleep quite a better.  There is confusion about whether he is taking the clonazepam at night but after much discussion he is taking a little green pill that you have to sign for. In addition to his Mirapex.   Hearing:  Hearing aids   Vision:  No limitations at present . Last eye check UTD  Safety:  Has smoke detector and wears seat belts.  No firearms. No excess sun exposure. Sees dentist regularly.  Falls:   When painting the deck and reached down  Lost balance up against screen door,  Advance directive :  Reviewed  Has one.  Memory: Felt to be good  , no concern from her or her family.  Depression: No anhedonia unusual crying or depressive symptoms  Nutrition: Eats well balanced diet; adequate calcium and vitamin D. No swallowing chewing problems.  Injury: no major injuries in the last six months.  Other healthcare providers:  Reviewed today .  Social:  Lives with spouse married. No pets.   Preventive parameters: up-to-date  Reviewed   ADLS:   There are no  problems or need for assistance  driving, feeding, obtaining food, dressing, toileting and bathing, managing money using phone. She is independent.  EXERCISE/ HABITS  Per week   No tobacco  Pinch every now and then    etoh  No  Coffee  2 per day.     ROS:  GEN/ HEENT: No fever, significant weight changes sweats headaches vision  hearing changes, CV/ PULM; No chest pain shortness of breath cough, syncope,edema  change in exercise tolerance. GI /GU: No adominal pain, vomiting, change in bowel habits. No blood in the stool. No significant GU symptoms. SKIN/HEME: ,no acute skin rashes suspicious lesions or bleeding. No lymphadenopathy, nodules, masses.  NEURO/ PSYCH:  No new neurologic signs such as weaknessNo depression anxiety. Denies any deterioration in his memory. IMM/ Allergy: No unusual infections.  Allergy .   REST of 12 system review negative except as per HPI   Past Medical History  Diagnosis Date  . ARTHRITIS, HIP   . Autoimmune hemolytic anemias   . CEREBROVASCULAR DISEASE   . Ischemic cardiomyopathy     S/P CABG; EF 201-25% 11/2009  . ICD (implantable cardiac defibrillator) in place     [facility]--implanted 2005  . DIABETES MELLITUS, TYPE II   . Enlargement of lymph nodes   . HYPERLIPIDEMIA   . HYPERTENSION   .  HYPOTHYROIDISM   . LESION, FACE   . Nocturia   . RESTLESS LEG SYNDROME   . UNSPECIFIED ANEMIA   . VITAMIN D DEFICIENCY   . WEIGHT LOSS   . OBSTRUCTIVE SLEEP APNEA 07/19/2009    Family History  Problem Relation Age of Onset  . COPD Father     History   Social History  . Marital Status: Married    Spouse Name: N/A    Number of Children: N/A  . Years of Education: N/A   Social History Main Topics  . Smoking status: Former Smoker -- 1.00 packs/day for 40 years    Types: Cigarettes    Quit date: 03/19/1978  . Smokeless tobacco: Current User  . Alcohol Use: No  . Drug Use: No  . Sexually Active: No   Other Topics Concern  . None   Social History  Narrative   hhof 2 married   No pets     In [place] for over 80 years.       Retired Education officer, museum.    Outpatient Encounter Prescriptions as of 09/03/2012  Medication Sig Dispense Refill  . aspirin EC 81 MG EC tablet Take 81 mg by mouth daily.        [name] atorvastatin (LIPITOR) 40 MG tablet Take 1 tablet (40 mg total) by mouth daily.  90 tablet  3  . carvedilol (COREG) 12.5 MG tablet Take 1 tablet (12.5 mg total) by mouth 2 (two) times daily with a meal.  180 tablet  3  . digoxin (LANOXIN) 0.125 MG tablet Take 1 tablet (125 mcg total) by mouth daily.  90 tablet  3  . docusate sodium (COLACE) 100 MG capsule Take 100 mg by mouth daily.      [name] escitalopram (LEXAPRO) 10 MG tablet Take 1 tablet (10 mg total) by mouth daily.  90 tablet  3  . fish oil-omega-3 fatty acids 1000 MG capsule Take 1 g by mouth daily.       . furosemide (LASIX) 40 MG tablet Take 1 tablet (40 mg total) by mouth daily.  90 tablet  3  . glipiZIDE (GLUCOTROL) 10 MG tablet Take 1 tablet (10 mg total) by mouth 2 (two) times daily before a meal.  270 tablet  3  . glucose blood (ACCU-CHEK COMFORT CURVE) test strip Use as instructed  100 each  11  . ipratropium (ATROVENT) 0.03 % nasal spray Place 2 sprays into the nose 2 (two) times daily.  90 mL  3  . metFORMIN (GLUCOPHAGE) 1000 MG tablet Take 1 tablet (1,000 mg total) by mouth 2 (two) times daily with a meal.  180 tablet  3  . niacin (NIASPAN) 1000 MG CR tablet Take 1 tablet (1,000 mg total) by mouth at bedtime.  90 tablet  3  . OVER THE COUNTER MEDICATION benfotiamine 300 mg once a day      . pramipexole (MIRAPEX) 0.5 MG tablet Take 1 tablet (0.5 mg total) by mouth daily. For restless legs  90 tablet  3  . ramipril (ALTACE) 5 MG capsule Take 2.5 mg by mouth 2 (two) times daily.      . saxagliptin HCl (ONGLYZA) 5 MG TABS tablet Take 1 tablet (5 mg total) by mouth daily.  90 tablet  3  . spironolactone (ALDACTONE) 25 MG tablet Take 1 tablet (25 mg total) by mouth daily.   90 tablet  3  . tamsulosin (FLOMAX) 0.4 MG CAPS Take 1 capsule (0.4 mg total) by  mouth daily.  90 capsule  3  . [DISCONTINUED] escitalopram (LEXAPRO) 10 MG tablet Take 1 tablet (10 mg total) by mouth daily.  90 tablet  3  . [DISCONTINUED] glipiZIDE (GLUCOTROL) 10 MG tablet Take 1 tablet (10 mg total) by mouth 2 (two) times daily before a meal.  270 tablet  3  . [DISCONTINUED] glucose blood (ACCU-CHEK COMFORT CURVE) test strip Use as instructed  100 each  11  . [DISCONTINUED] insulin detemir (LEVEMIR FLEXPEN) 100 UNIT/ML injection Daily at bedtime 14 UNITS OR AS DIRECTED  With needles NANO 4 MM  15 mL  12  . [DISCONTINUED] insulin detemir (LEVEMIR) 100 UNIT/ML injection Daily at bedtime 14 UNITS OR AS DIRECTED  With needles NANO 4 MM  15 mL  12  . [DISCONTINUED] ipratropium (ATROVENT) 0.03 % nasal spray Place 2 sprays into the nose 2 (two) times daily.      . [DISCONTINUED] metFORMIN (GLUCOPHAGE) 1000 MG tablet Take 1 tablet (1,000 mg total) by mouth 2 (two) times daily with a meal.  45 tablet  3  . [DISCONTINUED] pramipexole (MIRAPEX) 0.5 MG tablet Take 1 tablet (0.5 mg total) by mouth daily. For restless legs  90 tablet  1  . [DISCONTINUED] ramipril (ALTACE) 5 MG capsule Take 1 capsule (5 mg total) by mouth daily.  90 capsule  3  . [DISCONTINUED] saxagliptin HCl (ONGLYZA) 5 MG TABS tablet Take 1 tablet (5 mg total) by mouth daily.  90 tablet  1  . [DISCONTINUED] Tamsulosin HCl (FLOMAX) 0.4 MG CAPS Take 1 capsule (0.4 mg total) by mouth daily.  90 capsule  3  . Benfotiamine 150 MG CAPS Take 150 mg by mouth at bedtime.  90 capsule  3  . clonazePAM (KLONOPIN) 1 MG tablet Take 1 tablet (1 mg total) by mouth at bedtime as needed (OR AS DIRECTED ).  30 tablet  2  . Lancets (ACCU-CHEK SOFT TOUCH) lancets Use as instructed  100 each  12  . [DISCONTINUED] clonazePAM (KLONOPIN) 1 MG tablet Take 1 tablet (1 mg total) by mouth at bedtime as needed (OR AS DIRECTED ).  30 tablet  2  . [DISCONTINUED]  oxyCODONE-acetaminophen (PERCOCET/ROXICET) 5-325 MG per tablet Take 2 tablets by mouth every 4 (four) hours as needed for pain.  6 tablet  0  . [DISCONTINUED] tiZANidine (ZANAFLEX) 2 MG tablet Take 1 tablet (2 mg total) by mouth every 8 (eight) hours as needed (back spasm).  30 tablet  1   No facility-administered encounter medications on file as of 09/03/2012.    EXAM:  BP 126/60  Pulse 60  Temp(Src) 98.2 F (36.8 C) (Oral)  Ht 5' 11.5" (1.816 m)  Wt 205 lb (92.987 kg)  BMI 28.2 kg/m2  SpO2 97%  Body mass index is 28.2 kg/(m^2).  Physical Exam: Vital signs reviewed [id number] is a well-developed well-nourished alert cooperative malr  who appears stated age in no acute distress. He is hard of hearing speech intact as well as gait a bit flat-footed. Articulate in speech HEENT: normocephalic atraumatic , Eyes: PERRL EOM's full, conjunctiva clear, Nares: paten,t no deformity discharge or tenderness., Ears: no deformity EAC's hearing aids intact .NECK: supple without masses, thyromegaly or bruits. CHEST/PULM:  Clear to auscultation and percussion breath sounds equal no wheeze , rales or rhonchi.  CV: PMI is nondisplaced, S1 S2 no gallops, murmurs, rubs. ICD in place Peripheral pulses are full without delay.ABDOMEN: Bowel sounds normal nontender  No guard or rebound, no hepato splenomegal no CVA tenderness.  Extremtities:  No clubbing cyanosis slight edema, no acute joint swelling or redness no focal atrophy NEURO:  Oriented x3, cranial nerves 3-12 appear to be intact, no obvious focal weakness,gait within normal limits  Decreased sensation in the foot C. diabetic foot exam SKIN: No acute rashes normal turgor, color, noor petechiae. Fading bruise right forearm no hematoma PSYCH: Oriented, good eye contact, no obvious depression anxiety, cognition and judgment appear normal. LN: no cervical axillary inguinal adenopathy No noted gross deficits in memory, attention, and speech.   Lab Results   Component Value Date   WBC 11.7* 08/27/2012   HGB 12.1* 08/27/2012   HCT 37.3* 08/27/2012   PLT 172.0 08/27/2012   GLUCOSE 178* 08/27/2012   CHOL 68 08/27/2012   TRIG 121.0 08/27/2012   HDL 26.70* 08/27/2012   LDLCALC 17 08/27/2012   ALT 17 08/27/2012   AST 19 08/27/2012   NA 141 08/27/2012   K 4.7 08/27/2012   CL 106 08/27/2012   CREATININE 1.4 08/27/2012   BUN 34* 08/27/2012   CO2 24 08/27/2012   TSH 1.39 08/27/2012   PSA 2.47 08/02/2010   INR 1.11 12/16/2009   HGBA1C 8.7* 08/27/2012   MICROALBUR 0.6 08/14/2011   Review of labs from the [place] A1c was 9 hemoglobin 11.7 lipids reviewed Iron studies in RE HR ASSESSMENT AND PLAN:  Discussed the following assessment and plan:  Medicare annual wellness visit, subsequent  Diabetic neuropathy, type II diabetes mellitus - Worse control recently - Plan: clonazePAM (KLONOPIN) 1 MG tablet, saxagliptin HCl (ONGLYZA) 5 MG TABS tablet, pramipexole (MIRAPEX) 0.5 MG tablet  RESTLESS LEG SYNDROME - Worsening recently but supplement seems to help he is also iron deficiency we'll add iron supplementation to see how this helps - Plan: clonazePAM (KLONOPIN) 1 MG tablet, saxagliptin HCl (ONGLYZA) 5 MG TABS tablet, pramipexole (MIRAPEX) 0.5 MG tablet  CAD (coronary artery disease) - Plan: clonazePAM (KLONOPIN) 1 MG tablet, saxagliptin HCl (ONGLYZA) 5 MG TABS tablet, pramipexole (MIRAPEX) 0.5 MG tablet  HYPERTENSION - Plan: clonazePAM (KLONOPIN) 1 MG tablet, saxagliptin HCl (ONGLYZA) 5 MG TABS tablet, pramipexole (MIRAPEX) 0.5 MG tablet  HYPERLIPIDEMIA - Plan: clonazePAM (KLONOPIN) 1 MG tablet, saxagliptin HCl (ONGLYZA) 5 MG TABS tablet, pramipexole (MIRAPEX) 0.5 MG tablet  Sleep difficulties - Plan: clonazePAM (KLONOPIN) 1 MG tablet, saxagliptin HCl (ONGLYZA) 5 MG TABS tablet, pramipexole (MIRAPEX) 0.5 MG tablet  Iron deficiency anemia - New onset no obvious active bleeding we'll follow high-risk  SYSTOLIC HEART FAILURE, CHRONIC - Plan: clonazePAM (KLONOPIN) 1 MG  tablet, saxagliptin HCl (ONGLYZA) 5 MG TABS tablet, pramipexole (MIRAPEX) 0.5 MG tablet  Renal insufficiency - Plan: clonazePAM (KLONOPIN) 1 MG tablet, saxagliptin HCl (ONGLYZA) 5 MG TABS tablet, pramipexole (MIRAPEX) 0.5 MG tablet  DIABETES MELLITUS, TYPE II - Plan: glucose blood (ACCU-CHEK COMFORT CURVE) test strip  Medically complex patient Worsening diabetes agree with increasing the insulin with care avoid hypoglycemia patient knows what he can do as far as adjusting his eating  osa , under treatment multipl high risk medication  it is very difficult today for his medicines and make sure they're accurate. Long amount of time with this he is getting care at the [facility] specialists and here which make it a bit more problematic responsibility of care refills and disease states. Complex m,edical disease .  He actually looks quite good today and his wife is added to supplement for his legs which has helped him sleep a good bit but we will add iron and followup in 3-4 months  with iron levels and diabetes check. It is uncertain how much the glipizide is helping him as he is on glipizide on the [place] metformin and insulin. It would be good to simplify his regimen at some point but if he's doing well we'll not change that today. Talked with him and his wife about looking at his feet at that one small area keeping it clean covered if progressing need to contact our office consider seeing podiatrist or other specialist.  Patient Care Team: Burnis Medin, MD as PCP - General (Internal Medicine) Johna Sheriff, MD (Ophthalmology) Lelon Perla, MD (Cardiology) Deboraha Sprang, MD (Cardiology) Chesley Mires, MD (Pulmonary Disease) [name]  Patient Instructions  Your blood sugar has become more elevated. Agree with increasing her insulin and monitoring keeping her blood sugar below 180-160 range.  Check your feet every day take care of the area of that left foot if getting bigger or looking  infected we will get a foot doctor to see you.  I'm glad you're restless leg is better with the new medication however your iron level is low which also makes her restless leg feel worse.  I would like you take an iron supplement  these are over-the-counter usually ferrous sulfate or ferrous gluconate take 1  at least once a day twice a day is better. If it doesn't bother your stomach.  We should recheck your iron levels and blood count and A1c level in about 3-4 months to see how this is working.  Contact us if you have any Evidence of blood in your stool or bleeding.  Caution take care when you were doing her housework and tasks to avoid falling and losing balance.     Iron-Rich Diet An iron-rich diet contains foods that are good sources of iron. Iron is an important mineral that helps your body produce hemoglobin. Hemoglobin is a protein in red blood cells that carries oxygen to the body's tissues. Sometimes, the iron level in your blood can be low. This may be caused by:  A lack of iron in your diet.  Blood loss.  Times of growth, such as during pregnancy or during a child's growth and development. Low levels of iron can cause a decrease in the number of red blood cells. This can result in iron deficiency anemia. Iron deficiency anemia symptoms include:  Tiredness.  Weakness.  Irritability.  Increased chance of infection. Here are some recommendations for daily iron intake:  Males older than 77 years of age need 8 mg of iron per day.  Women ages 72 to 31 need 18 mg of iron per day.  Pregnant women need 27 mg of iron per day, and women who are over 37 years of age and breastfeeding need 9 mg of iron per day.  Women over the age of 68 need 8 mg of iron per day. SOURCES OF IRON There are 2 types of iron that are found in food: heme iron and nonheme iron. Heme iron is absorbed by the body better than nonheme iron. Heme iron is found in meat, poultry, and fish. Nonheme iron  is found in grains, beans, and vegetables. Heme Iron Sources Food / Iron (mg)  Chicken liver, 3 oz (85 g)/ 10 mg  Beef liver, 3 oz (85 g)/ 5.5 mg  Oysters, 3 oz (85 g)/ 8 mg  Beef, 3 oz (85 g)/ 2 to 3 mg  Shrimp, 3 oz (85 g)/ 2.8 mg  [place], 3 oz (  85 g)/ 2 mg  Chicken, 3 oz (85 g) / 1 mg  Fish (tuna, halibut), 3 oz (85 g)/ 1 mg  Pork, 3 oz (85 g)/ 0.9 mg Nonheme Iron Sources Food / Iron (mg)  Ready-to-eat breakfast cereal, iron-fortified / 3.9 to 7 mg  Tofu,  cup / 3.4 mg  Kidney beans,  cup / 2.6 mg  Baked potato with skin / 2.7 mg  Asparagus,  cup / 2.2 mg  Avocado / 2 mg  Dried peaches,  cup / 1.6 mg  Raisins,  cup / 1.5 mg  Soy milk, 1 cup / 1.5 mg  Whole-wheat bread, 1 slice / 1.2 mg  Spinach, 1 cup / 0.8 mg  Broccoli,  cup / 0.6 mg IRON ABSORPTION Certain foods can decrease the body's absorption of iron. Try to avoid these foods and beverages while eating meals with iron-containing foods:  Coffee.  Tea.  Fiber.  Soy. Foods containing vitamin C can help increase the amount of iron your body absorbs from iron sources, especially from nonheme sources. Eat foods with vitamin C along with iron-containing foods to increase your iron absorption. Foods that are high in vitamin C include many fruits and vegetables. Some good sources are:  Fresh orange juice.  Oranges.  Strawberries.  Mangoes.  Grapefruit.  Red bell peppers.  Green bell peppers.  Broccoli.  Potatoes with skin.  Tomato juice. Document Released: 10/17/2004 Document Revised: 05/28/2011 Document Reviewed: 08/24/2010 [facility] Patient Information 2014 [place], [place]. Diabetes and Foot Care Diabetes may cause you to have a poor blood supply (circulation) to your legs and feet. Because of this, the skin may be thinner, break easier, and heal more slowly. You also may have nerve damage in your legs and feet causing decreased feeling. You may not notice minor injuries to your  feet that could lead to serious problems or infections. Taking care of your feet is one of the most important things you can do for yourself.  HOME CARE INSTRUCTIONS  Do not go barefoot. Bare feet are easily injured.  Check your feet daily for blisters, cuts, and redness.  Wash your feet with warm water (not hot) and mild soap. Pat your feet and between your toes until completely dry.  Apply a moisturizing lotion that does not contain alcohol or petroleum jelly to the dry skin on your feet and to dry brittle toenails. Do not put it between your toes.  Trim your toenails straight across. Do not dig under them or around the cuticle.  Do not cut corns or calluses, or try to remove them with medicine.  Wear clean cotton socks or stockings every day. Make sure they are not too tight. Do not wear knee high stockings since they may decrease blood flow to your legs.  Wear leather shoes that fit properly and have enough cushioning. To break in new shoes, wear them just a few hours a day to avoid injuring your feet.  Wear shoes at all times, even in the house.  Do not cross your legs. This may decrease the blood flow to your feet.  If you find a minor scrape, cut, or break in the skin on your feet, keep it and the skin around it clean and dry. These areas may be cleansed with mild soap and water. Do not use peroxide, alcohol, iodine or Merthiolate.  When you remove an adhesive bandage, be sure not to harm the skin around it.  If you have a wound, look at it several  times a day to make sure it is healing.  Do not use heating pads or hot water bottles. Burns can occur. If you have lost feeling in your feet or legs, you may not know it is happening until it is too late.  Report any cuts, sores or bruises to your caregiver. Do not wait! SEEK MEDICAL CARE IF:   You have an injury that is not healing or you notice redness, numbness, burning, or tingling.  Your feet always feel cold.  You have  pain or cramps in your legs and feet. SEEK IMMEDIATE MEDICAL CARE IF:   There is increasing redness, swelling, or increasing pain in the wound.  There is a red line that goes up your leg.  Pus is coming from a wound.  You develop an unexplained oral temperature above 102 F (38.9 C), or as your caregiver suggests.  You notice a bad smell coming from an ulcer or wound. MAKE SURE YOU:   Understand these instructions.  Will watch your condition.  Will get help right away if you are not doing well or get worse. Document Released: 03/02/2000 Document Revised: 05/28/2011 Document Reviewed: 09/08/2008 [facility] Patient Information 2014 [place], [place].     Standley Brooking. Lynae Pederson M.D.   Health Maintenance  Topic Date Due  . Colonoscopy  05/26/1980  . Influenza Vaccine  11/17/2012  . Tetanus/tdap  03/13/2022  . Pneumococcal Polysaccharide Vaccine Age 37 And Over  Completed  . Zostavax  Completed   Health Maintenance Review } Note from sleep pulm specialist   [place]  As off 5 14  His symptom description is suggestive of progression of his restless legs. This has been managed by his PCP. Plan was to have lab work prior to his [place] in July with PCP. Advised him to d/w Dr. Regis Bill about whether he should have lab work done sooner to further assess.

## 2012-09-12 ENCOUNTER — Encounter: Payer: Self-pay | Admitting: Internal Medicine

## 2012-09-12 ENCOUNTER — Ambulatory Visit (INDEPENDENT_AMBULATORY_CARE_PROVIDER_SITE_OTHER): Payer: Medicare Other | Admitting: Internal Medicine

## 2012-09-12 VITALS — BP 118/62 | HR 62 | Ht 71.5 in | Wt 206.4 lb

## 2012-09-12 DIAGNOSIS — Z79899 Other long term (current) drug therapy: Secondary | ICD-10-CM

## 2012-09-12 DIAGNOSIS — I255 Ischemic cardiomyopathy: Secondary | ICD-10-CM

## 2012-09-12 DIAGNOSIS — I2589 Other forms of chronic ischemic heart disease: Secondary | ICD-10-CM

## 2012-09-12 LAB — ICD DEVICE OBSERVATION
CHARGE TIME: 0 s
RV LEAD THRESHOLD: 1 V
TOT-0008: 0
TOT-0009: 2
TZAT-0001SLOWVT: 1
TZAT-0004FASTVT: 8
TZAT-0012FASTVT: 200 ms
TZAT-0012SLOWVT: 200 ms
TZAT-0013FASTVT: 1
TZAT-0013SLOWVT: 3
TZAT-0018FASTVT: NEGATIVE
TZAT-0019FASTVT: 7.5 V
TZON-0003FASTVT: 300 ms
TZON-0004FASTVT: 30
TZON-0010FASTVT: 80 ms
TZST-0001FASTVT: 3
TZST-0001FASTVT: 5
TZST-0001SLOWVT: 2
TZST-0001SLOWVT: 4
TZST-0001SLOWVT: 5
TZST-0003FASTVT: 36 J
TZST-0003FASTVT: 36 J
TZST-0003SLOWVT: 15 J
TZST-0003SLOWVT: 25 J
VENTRICULAR PACING ICD: 0 pct

## 2012-09-12 NOTE — Assessment & Plan Note (Signed)
As above.

## 2012-09-12 NOTE — Progress Notes (Signed)
Patient Care Team: Burnis Medin, MD as PCP - General (Internal Medicine) Johna Sheriff, MD (Ophthalmology) Lelon Perla, MD (Cardiology) Deboraha Sprang, MD (Cardiology) Chesley Mires, MD (Pulmonary Disease) [name]   HPI  [name] is a 79 y.o. male seen in followup for an ICD implanted in the setting of  ischemic heart disease prior bypass surgery and chronic systolic failure.   His last echocardiogram was performed in September 2011 when he presented with congestive heart failure. It demonstrated diffuse wall motion abnormalities and an ejection fraction of 20-25% His last Myoview in March of 2009 showed an ejection fraction of 25% with a prior anterior, apical, and inferoapical infarct, but there was no ischemia.  The patient denies chest pain, shortness of breath, nocturnal dyspnea, orthopnea or peripheral edema. There have been no palpitations, lightheadedness or syncope.    He told me the story last year of having been trapped by the [place] in [place] and taking 12 days of day and night fighting to breakthrough; three quarters of the man of the 28 didn't survive   Past Medical History  Diagnosis Date  . ARTHRITIS, HIP   . Autoimmune hemolytic anemias   . CEREBROVASCULAR DISEASE   . Ischemic cardiomyopathy     S/P CABG; EF 201-25% 11/2009  . ICD (implantable cardiac defibrillator) in place     [employer]--implanted 2005  . DIABETES MELLITUS, TYPE II   . Enlargement of lymph nodes   . HYPERLIPIDEMIA   . HYPERTENSION   . HYPOTHYROIDISM   . LESION, FACE   . Nocturia   . RESTLESS LEG SYNDROME   . UNSPECIFIED ANEMIA   . VITAMIN D DEFICIENCY   . WEIGHT LOSS   . OBSTRUCTIVE SLEEP APNEA 07/19/2009    Past Surgical History  Procedure Laterality Date  . Cataract extraction    . Cholecystectomy    . Cardiac defibrillator placement    . Ventricular resection / repair aneurysm      left   . Cabg      Current Outpatient Prescriptions  Medication Sig  Dispense Refill  . aspirin EC 81 MG EC tablet Take 81 mg by mouth daily.        [name] atorvastatin (LIPITOR) 40 MG tablet Take 1 tablet (40 mg total) by mouth daily.  90 tablet  3  . Benfotiamine 150 MG CAPS Take 300 mg by mouth at bedtime.      . carvedilol (COREG) 12.5 MG tablet Take 1 tablet (12.5 mg total) by mouth 2 (two) times daily with a meal.  180 tablet  3  . digoxin (LANOXIN) 0.125 MG tablet Take 1 tablet (125 mcg total) by mouth daily.  90 tablet  3  . docusate sodium (COLACE) 100 MG capsule Take 100 mg by mouth daily.      [name] escitalopram (LEXAPRO) 10 MG tablet Take 1 tablet (10 mg total) by mouth daily.  90 tablet  3  . fish oil-omega-3 fatty acids 1000 MG capsule Take 1 g by mouth daily.       . furosemide (LASIX) 40 MG tablet Take 1 tablet (40 mg total) by mouth daily.  90 tablet  3  . glipiZIDE (GLUCOTROL) 10 MG tablet Take 1 tablet (10 mg total) by mouth 2 (two) times daily before a meal.  270 tablet  3  . glucose blood (ACCU-CHEK COMFORT CURVE) test strip Use as instructed  100 each  11  . insulin detemir (LEVEMIR) 100  UNIT/ML injection Daily at bedtime 18 UNITS OR AS DIRECTED  10 mL  12  . ipratropium (ATROVENT) 0.03 % nasal spray Place 2 sprays into the nose 2 (two) times daily.  90 mL  3  . Lancets (ACCU-CHEK SOFT TOUCH) lancets Use as instructed  100 each  12  . metFORMIN (GLUCOPHAGE) 1000 MG tablet Take 1 tablet (1,000 mg total) by mouth 2 (two) times daily with a meal.  180 tablet  3  . niacin (NIASPAN) 1000 MG CR tablet Take 1 tablet (1,000 mg total) by mouth at bedtime.  90 tablet  3  . pramipexole (MIRAPEX) 0.5 MG tablet Take 1 tablet (0.5 mg total) by mouth daily. For restless legs  90 tablet  3  . ramipril (ALTACE) 5 MG capsule Take 2.5 mg by mouth 2 (two) times daily.      . saxagliptin HCl (ONGLYZA) 5 MG TABS tablet Take 1 tablet (5 mg total) by mouth daily.  90 tablet  3  . spironolactone (ALDACTONE) 25 MG tablet Take 1 tablet (25 mg total) by mouth daily.  90 tablet   3  . tamsulosin (FLOMAX) 0.4 MG CAPS Take 1 capsule (0.4 mg total) by mouth daily.  90 capsule  3  . clonazePAM (KLONOPIN) 1 MG tablet Take 1 tablet (1 mg total) by mouth at bedtime as needed (OR AS DIRECTED ).  30 tablet  2   No current facility-administered medications for this visit.    No Known Allergies  Review of Systems negative except from HPI and PMH  Physical Exam BP 118/62  Pulse 62  Ht 5' 11.5" (1.816 m)  Wt 206 lb 6.4 oz (93.622 kg)  BMI 28.39 kg/m2 Well developed and well nourished in no acute distress HENT normal E scleral and icterus clear Neck Supple JVP flat; carotids brisk and full Clear to ausculation  Regular rate and rhythm, no murmurs gallops or rub Soft with active bowel sounds No clubbing cyanosis none Edema Alert and oriented, grossly normal motor and sensory function Skin Warm and Dry  ECG demonstrates sinus rhythm with left bundle branch block left axis deviation  Assessment and  Plan

## 2012-09-12 NOTE — Patient Instructions (Addendum)
You will need lab work today: Digoxin level   We will call you with your results  Your physician wants you to follow-up in: 6 months with Dr. Stanford Breed. You will receive a reminder letter in the mail two months in advance. If you don't receive a letter, please call our office to schedule the follow-up appointment.  Your physician wants you to follow-up in: 1 year with Dr. Gari Crown will receive a reminder letter in the mail two months in advance. If you don't receive a letter, please call our office to schedule the follow-up appointment.  Send a Merlin transmission on 10/13/12

## 2012-09-12 NOTE — Assessment & Plan Note (Signed)
The patient's device was interrogated.  The information was reviewed. No changes were made in the programming.    

## 2012-09-12 NOTE — Assessment & Plan Note (Signed)
Euvolemic; continue current meds. We will check a digoxin level. Potassium monitoring on Aldactone earlier this month was normal

## 2012-09-22 ENCOUNTER — Encounter: Payer: Medicare Other | Admitting: Internal Medicine

## 2012-09-29 ENCOUNTER — Telehealth: Payer: Self-pay | Admitting: Nurse Practitioner

## 2012-09-29 DIAGNOSIS — I5022 Chronic systolic (congestive) heart failure: Secondary | ICD-10-CM

## 2012-09-29 NOTE — Telephone Encounter (Signed)
Reviewed lab results and plan of care with patient's wife who verbalized understanding. Patient's wife informed of decreased digoxin dosage to 0.0625 due to elevated dig level and repeat level ordered for 7/28.

## 2012-10-01 ENCOUNTER — Other Ambulatory Visit: Payer: Self-pay | Admitting: Family Medicine

## 2012-10-01 MED ORDER — INSULIN DETEMIR 100 UNIT/ML FLEXPEN: PEN_INJECTOR | SUBCUTANEOUS | Status: DC

## 2012-10-13 ENCOUNTER — Other Ambulatory Visit: Payer: Medicare Other

## 2012-10-13 ENCOUNTER — Encounter: Payer: Medicare Other | Admitting: *Deleted

## 2012-10-13 DIAGNOSIS — I5022 Chronic systolic (congestive) heart failure: Secondary | ICD-10-CM

## 2012-10-14 LAB — DIGOXIN LEVEL: Digoxin Level: 0.7 ng/mL — ABNORMAL LOW (ref 0.8–2.0)

## 2012-10-15 ENCOUNTER — Encounter: Payer: Self-pay | Admitting: *Deleted

## 2012-10-20 ENCOUNTER — Encounter: Payer: Medicare Other | Admitting: *Deleted

## 2012-11-03 ENCOUNTER — Encounter: Payer: Self-pay | Admitting: Internal Medicine

## 2012-11-24 ENCOUNTER — Ambulatory Visit (INDEPENDENT_AMBULATORY_CARE_PROVIDER_SITE_OTHER): Payer: Medicare Other | Admitting: *Deleted

## 2012-11-24 DIAGNOSIS — Z4502 Encounter for adjustment and management of automatic implantable cardiac defibrillator: Secondary | ICD-10-CM

## 2012-11-26 ENCOUNTER — Other Ambulatory Visit (INDEPENDENT_AMBULATORY_CARE_PROVIDER_SITE_OTHER): Payer: Medicare Other

## 2012-11-26 DIAGNOSIS — IMO0001 Reserved for inherently not codable concepts without codable children: Secondary | ICD-10-CM | POA: Diagnosis not present

## 2012-11-26 DIAGNOSIS — D509 Iron deficiency anemia, unspecified: Secondary | ICD-10-CM

## 2012-11-26 LAB — CBC WITH DIFFERENTIAL/PLATELET
Basophils Absolute: 0 10*3/uL (ref 0.0–0.1)
Eosinophils Absolute: 0.1 10*3/uL (ref 0.0–0.7)
Hemoglobin: 13.3 g/dL (ref 13.0–17.0)
Lymphocytes Relative: 18.4 % (ref 12.0–46.0)
MCHC: 33.5 g/dL (ref 30.0–36.0)
Neutro Abs: 7.3 10*3/uL (ref 1.4–7.7)
RDW: 17.1 % — ABNORMAL HIGH (ref 11.5–14.6)

## 2012-11-26 LAB — IBC PANEL
Iron: 96 ug/dL (ref 42–165)
Transferrin: 273.2 mg/dL (ref 212.0–360.0)

## 2012-12-01 ENCOUNTER — Ambulatory Visit (INDEPENDENT_AMBULATORY_CARE_PROVIDER_SITE_OTHER): Payer: Medicare Other | Admitting: Internal Medicine

## 2012-12-01 ENCOUNTER — Encounter: Payer: Self-pay | Admitting: Internal Medicine

## 2012-12-01 VITALS — BP 120/54 | HR 59 | Temp 98.4°F | Wt 203.0 lb

## 2012-12-01 DIAGNOSIS — D509 Iron deficiency anemia, unspecified: Secondary | ICD-10-CM | POA: Diagnosis not present

## 2012-12-01 DIAGNOSIS — I2589 Other forms of chronic ischemic heart disease: Secondary | ICD-10-CM | POA: Diagnosis not present

## 2012-12-01 DIAGNOSIS — E1140 Type 2 diabetes mellitus with diabetic neuropathy, unspecified: Secondary | ICD-10-CM

## 2012-12-01 DIAGNOSIS — E1142 Type 2 diabetes mellitus with diabetic polyneuropathy: Secondary | ICD-10-CM

## 2012-12-01 DIAGNOSIS — Z23 Encounter for immunization: Secondary | ICD-10-CM

## 2012-12-01 DIAGNOSIS — R258 Other abnormal involuntary movements: Secondary | ICD-10-CM

## 2012-12-01 DIAGNOSIS — E1149 Type 2 diabetes mellitus with other diabetic neurological complication: Secondary | ICD-10-CM

## 2012-12-01 DIAGNOSIS — R259 Unspecified abnormal involuntary movements: Secondary | ICD-10-CM

## 2012-12-01 DIAGNOSIS — I255 Ischemic cardiomyopathy: Secondary | ICD-10-CM

## 2012-12-01 MED ORDER — INSULIN DETEMIR 100 UNIT/ML FLEXPEN: PEN_INJECTOR | SUBCUTANEOUS | Status: DC

## 2012-12-01 NOTE — Progress Notes (Signed)
Chief Complaint  Patient presents with  . Anemia  . Diabetes    HPI: Patient comes in today for follow up of  multiple medical problems.  Here with wife. Since the last time he had had labs at the [facility] hospital blood sugars fasting are about 170-200. He went up from 14 units to 18 units of insulin for the last 3-4 weeks. No low blood sugars. Feels pretty well his legs feel somewhat better he is on iron supplementation at least once or twice a day. No active bleeding. No recent falling. Says he is looking at his feet and has no ulcers or sore spots. ROS: See pertinent positives and negatives per HPI. No current chest pain new shortness of breath or edema. Wearing hearing aids.  Past Medical History  Diagnosis Date  . ARTHRITIS, HIP   . Autoimmune hemolytic anemias   . CEREBROVASCULAR DISEASE   . Ischemic cardiomyopathy     S/P CABG; EF 201-25% 11/2009  . ICD (implantable cardiac defibrillator) in place     [employer]--implanted 2005  . DIABETES MELLITUS, TYPE II   . Enlargement of lymph nodes   . HYPERLIPIDEMIA   . HYPERTENSION   . HYPOTHYROIDISM   . LESION, FACE   . Nocturia   . RESTLESS LEG SYNDROME   . UNSPECIFIED ANEMIA   . VITAMIN D DEFICIENCY   . WEIGHT LOSS   . OBSTRUCTIVE SLEEP APNEA 07/19/2009    Family History  Problem Relation Age of Onset  . COPD Father     History   Social History  . Marital Status: Married    Spouse Name: N/A    Number of Children: N/A  . Years of Education: N/A   Social History Main Topics  . Smoking status: Former Smoker -- 1.00 packs/day for 40 years    Types: Cigarettes    Quit date: 03/19/1978  . Smokeless tobacco: Current User  . Alcohol Use: No  . Drug Use: No  . Sexual Activity: No   Other Topics Concern  . None   Social History Narrative   hhof 2 married   No pets     In [place] for over 47 years.       Retired Education officer, museum.    Outpatient Encounter Prescriptions as of 12/01/2012  Medication Sig Dispense  Refill  . aspirin EC 81 MG EC tablet Take 81 mg by mouth daily.        [name] atorvastatin (LIPITOR) 40 MG tablet Take 1 tablet (40 mg total) by mouth daily.  90 tablet  3  . Benfotiamine 150 MG CAPS Take 300 mg by mouth at bedtime.      . carvedilol (COREG) 12.5 MG tablet Take 1 tablet (12.5 mg total) by mouth 2 (two) times daily with a meal.  180 tablet  3  . clonazePAM (KLONOPIN) 1 MG tablet Take 1 tablet (1 mg total) by mouth at bedtime as needed (OR AS DIRECTED ).  30 tablet  2  . digoxin (LANOXIN) 0.125 MG tablet Take 1 tablet (125 mcg total) by mouth daily.  90 tablet  3  . docusate sodium (COLACE) 100 MG capsule Take 100 mg by mouth daily.      [name] escitalopram (LEXAPRO) 10 MG tablet Take 1 tablet (10 mg total) by mouth daily.  90 tablet  3  . furosemide (LASIX) 40 MG tablet Take 1 tablet (40 mg total) by mouth daily.  90 tablet  3  . glipiZIDE (GLUCOTROL) 10  MG tablet Take 1 tablet (10 mg total) by mouth 2 (two) times daily before a meal.  270 tablet  3  . glucose blood (ACCU-CHEK COMFORT CURVE) test strip Use as instructed  100 each  11  . Insulin Detemir (LEVEMIR FLEXTOUCH) 100 UNIT/ML SOPN Inject 20 units at bedtime or as directed  1 pen  5  . ipratropium (ATROVENT) 0.03 % nasal spray Place 2 sprays into the nose 2 (two) times daily.  90 mL  3  . Lancets (ACCU-CHEK SOFT TOUCH) lancets Use as instructed  100 each  12  . metFORMIN (GLUCOPHAGE) 1000 MG tablet Take 1 tablet (1,000 mg total) by mouth 2 (two) times daily with a meal.  180 tablet  3  . niacin (NIASPAN) 1000 MG CR tablet Take 500 mg by mouth at bedtime.      . pramipexole (MIRAPEX) 0.5 MG tablet Take 1 tablet (0.5 mg total) by mouth daily. For restless legs  90 tablet  3  . ramipril (ALTACE) 5 MG capsule Take 2.5 mg by mouth 2 (two) times daily.      . saxagliptin HCl (ONGLYZA) 5 MG TABS tablet Take 1 tablet (5 mg total) by mouth daily.  90 tablet  3  . spironolactone (ALDACTONE) 25 MG tablet Take 1 tablet (25 mg total) by mouth  daily.  90 tablet  3  . tamsulosin (FLOMAX) 0.4 MG CAPS Take 1 capsule (0.4 mg total) by mouth daily.  90 capsule  3  . [DISCONTINUED] fish oil-omega-3 fatty acids 1000 MG capsule Take 1 g by mouth daily.       . [DISCONTINUED] Insulin Detemir (LEVEMIR FLEXTOUCH) 100 UNIT/ML SOPN Inject 18 units at bedtime or as directed  1 pen  5  . [DISCONTINUED] niacin (NIASPAN) 1000 MG CR tablet Take 1 tablet (1,000 mg total) by mouth at bedtime.  90 tablet  3   No facility-administered encounter medications on file as of 12/01/2012.    EXAM:  BP 120/54  Pulse 59  Temp(Src) 98.4 F (36.9 C) (Oral)  Wt 203 lb (92.08 kg)  BMI 27.92 kg/m2  SpO2 98%  Body mass index is 27.92 kg/(m^2).  GENERAL: vitals reviewed and listed above, alert, oriented, appears well hydrated and in no acute distress slightly hard of hearing  HEENT: atraumatic, conjunctiva  clear, no obvious abnormalities on inspection of external nose and ears  NECK: no obvious masses on inspection palpation  LUNGS: clear to auscultation bilaterally, no wheezes, rales or rhonchi, CV: HRRR, no clubbing cyanosis or  peripheral edema nl cap refill  MS: moves all extremities without noticeable focal  abnormality  PSYCH: pleasant and cooperative, no obvious depression or anxiety Lab Results  Component Value Date   WBC 10.2 11/26/2012   HGB 13.3 11/26/2012   HCT 39.8 11/26/2012   PLT 155.0 11/26/2012   GLUCOSE 178* 08/27/2012   CHOL 68 08/27/2012   TRIG 121.0 08/27/2012   HDL 26.70* 08/27/2012   LDLCALC 17 08/27/2012   ALT 17 08/27/2012   AST 19 08/27/2012   NA 141 08/27/2012   K 4.7 08/27/2012   CL 106 08/27/2012   CREATININE 1.4 08/27/2012   BUN 34* 08/27/2012   CO2 24 08/27/2012   TSH 1.39 08/27/2012   PSA 2.47 08/02/2010   INR 1.11 12/16/2009   HGBA1C 8.4* 11/26/2012   MICROALBUR 0.6 08/14/2011    ASSESSMENT AND PLAN:  Discussed the following assessment and plan:  Nocturnal leg movements  Iron deficiency anemia  Diabetic neuropathy,  type II diabetes mellitus  Ischemic cardiomyopathy  Need for prophylactic vaccination and inoculation against influenza - Plan: Flu Vaccine QUAD 36+ mos PF IM (Fluarix) He states that his legs feel somewhat better this is possibly from the iron supplementation although he still iron deficient. He has no active bleeding. His diabetes could be better control although 1 to avoid going low would like to get it under 8 possible increase his insulin to 20 units a day and contact us if his sugars are high at 200 and above. We'll recheck his A1c in about 3-4 months for followup visit. -Patient advised to return or notify health care team  if symptoms worsen or persist or new concerns arise.  Patient Instructions  Your diabetes is slightly better but needs to be improved without going low.  Increase the Levemir to 20 units a day. Check your blood sugars to make sure they are not going below 100.  Continue the iron increase to twice a day if your stomach is okay with this. Your iron levels are better and that should help her restless legs at night. Continue to look and take care of your feet.  Standley Brooking. Panosh M.D.

## 2012-12-01 NOTE — Patient Instructions (Addendum)
Your diabetes is slightly better but needs to be improved without going low.  Increase the Levemir to 20 units a day. Check your blood sugars to make sure they are not going below 100.  Continue the iron increase to twice a day if your stomach is okay with this. Your iron levels are better and that should help her restless legs at night. Continue to look and take care of your feet.

## 2012-12-17 ENCOUNTER — Telehealth: Payer: Self-pay | Admitting: Internal Medicine

## 2012-12-17 NOTE — Telephone Encounter (Signed)
Device is nearing ERI of 2.45V (currently at 2.5V), monthly battery checks will continue.  I let pt know it's okay to wait until 01/12/13 to transmit instead of 12/22/12.

## 2012-12-17 NOTE — Telephone Encounter (Signed)
New Problem  Pt's wife states that he recenlty sent a signal on 9/29 and that she doesn't know why he needs to send another signal on 10/6. Please call pt back to clarify.

## 2012-12-22 ENCOUNTER — Encounter: Payer: Medicare Other | Admitting: *Deleted

## 2012-12-25 ENCOUNTER — Encounter: Payer: Self-pay | Admitting: *Deleted

## 2012-12-28 ENCOUNTER — Ambulatory Visit (INDEPENDENT_AMBULATORY_CARE_PROVIDER_SITE_OTHER): Payer: Medicare Other | Admitting: Internal Medicine

## 2012-12-28 VITALS — BP 100/48 | HR 68 | Temp 98.4°F | Resp 16 | Ht 72.5 in | Wt 199.0 lb

## 2012-12-28 DIAGNOSIS — R197 Diarrhea, unspecified: Secondary | ICD-10-CM | POA: Diagnosis not present

## 2012-12-28 LAB — POCT CBC
Hemoglobin: 13.8 g/dL — AB (ref 14.1–18.1)
Lymph, poc: 2.5 (ref 0.6–3.4)
MCH, POC: 30.8 pg (ref 27–31.2)
MCHC: 31.7 g/dL — AB (ref 31.8–35.4)
MCV: 97.3 fL — AB (ref 80–97)
MPV: 10.2 fL (ref 0–99.8)
POC Granulocyte: 8.4 — AB (ref 2–6.9)
POC LYMPH PERCENT: 21.8 %L (ref 10–50)
POC MID %: 5.5 %M (ref 0–12)
RBC: 4.48 M/uL — AB (ref 4.69–6.13)
RDW, POC: 15.1 %
WBC: 11.6 10*3/uL — AB (ref 4.6–10.2)

## 2012-12-28 NOTE — Progress Notes (Addendum)
Subjective:  This chart was scribed for Tami Lin, MD by Roxan Diesel, ED scribe.  This patient was seen in room [facility] Room 4 and the patient's care was started at 9:24 AM.   Patient ID: [name], male    DOB: 31-May-1928, 79 y.o.   MRN: [id number]  HPI  HPI Comments: [name] is a 79 y.o. male who presents with a chief complaint of diarrhea that began 4 days ago   Pt states his diarrhea became slightly better but then became worse again.  Last night it was worsened after eating dinner and he had over 5 episodes of diarrhea last night.  Episodes of diarrhea are associated with cramping abdominal pain which resolves afterward.  He also notes one episode of emesis.  He states he has been more fatigued since his diarrhea began.  He denies blood in stool, fever, or urinary issues.  Pt notes that he recently changed from 1 iron tablet/day to 2.  He denies any other recent medication changes.  He denies recent travel or suspect food intake.  Diarrhea had resolved until he ate fried fish last night None today  Past Medical History  Diagnosis Date   ARTHRITIS, HIP    Autoimmune hemolytic anemias    CEREBROVASCULAR DISEASE    Ischemic cardiomyopathy     S/P CABG; EF 201-25% 11/2009   ICD (implantable cardiac defibrillator) in place     [employer]--implanted 2005   DIABETES MELLITUS, TYPE II    Enlargement of lymph nodes    HYPERLIPIDEMIA    HYPERTENSION    HYPOTHYROIDISM    LESION, FACE    Nocturia    RESTLESS LEG SYNDROME    UNSPECIFIED ANEMIA    VITAMIN D DEFICIENCY    WEIGHT LOSS    OBSTRUCTIVE SLEEP APNEA 07/19/2009       Medication List       This list is accurate as of: 12/28/12  9:32 AM.  Always use your most recent med list.               accu-chek soft touch lancets  Use as instructed     aspirin EC 81 MG tablet  Take 81 mg by mouth daily.     atorvastatin 40 MG tablet  Commonly known as:  LIPITOR  Take 1 tablet (40 mg total)  by mouth daily.     Benfotiamine 150 MG Caps  Take 300 mg by mouth at bedtime.     carvedilol 12.5 MG tablet  Commonly known as:  COREG  Take 1 tablet (12.5 mg total) by mouth 2 (two) times daily with a meal.     clonazePAM 1 MG tablet  Commonly known as:  KLONOPIN  Take 1 tablet (1 mg total) by mouth at bedtime as needed (OR AS DIRECTED ).     digoxin 0.125 MG tablet  Commonly known as:  LANOXIN  Take 1 tablet (125 mcg total) by mouth daily.     docusate sodium 100 MG capsule  Commonly known as:  COLACE  Take 100 mg by mouth daily.     escitalopram 10 MG tablet  Commonly known as:  LEXAPRO  Take 1 tablet (10 mg total) by mouth daily.     furosemide 40 MG tablet  Commonly known as:  LASIX  Take 1 tablet (40 mg total) by mouth daily.     glipiZIDE 10 MG tablet  Commonly known as:  GLUCOTROL  Take 1 tablet (10 mg total) by  mouth 2 (two) times daily before a meal.     glucose blood test strip  Commonly known as:  ACCU-CHEK COMFORT CURVE  Use as instructed     Insulin Detemir 100 UNIT/ML Sopn  Commonly known as:  LEVEMIR FLEXTOUCH  Inject 20 units at bedtime or as directed     ipratropium 0.03 % nasal spray  Commonly known as:  ATROVENT  Place 2 sprays into the nose 2 (two) times daily.     metFORMIN 1000 MG tablet  Commonly known as:  GLUCOPHAGE  Take 1 tablet (1,000 mg total) by mouth 2 (two) times daily with a meal.     niacin 1000 MG CR tablet  Commonly known as:  NIASPAN  Take 500 mg by mouth at bedtime.     pramipexole 0.5 MG tablet  Commonly known as:  MIRAPEX  Take 1 tablet (0.5 mg total) by mouth daily. For restless legs     ramipril 5 MG capsule  Commonly known as:  ALTACE  Take 2.5 mg by mouth 2 (two) times daily.     saxagliptin HCl 5 MG Tabs tablet  Commonly known as:  ONGLYZA  Take 1 tablet (5 mg total) by mouth daily.     spironolactone 25 MG tablet  Commonly known as:  ALDACTONE  Take 1 tablet (25 mg total) by mouth daily.      tamsulosin 0.4 MG Caps capsule  Commonly known as:  FLOMAX  Take 1 capsule (0.4 mg total) by mouth daily.        History   Social History   Marital Status: Married    Spouse Name: N/A    Number of Children: N/A   Years of Education: N/A   Occupational History   Not on file.   Social History Main Topics   Smoking status: Former Smoker -- 1.00 packs/day for 40 years    Types: Cigarettes    Quit date: 03/19/1978   Smokeless tobacco: Current User   Alcohol Use: No   Drug Use: No   Sexual Activity: No   Other Topics Concern   Not on file   Social History Narrative   hhof 2 married   No pets     In [place] for over 50 years.       Retired Education officer, museum.    Review of Systems     Objective:   Physical Exam  Constitutional: He is oriented to person, place, and time. He appears well-nourished. No distress.  Cardiovascular: Normal rate and regular rhythm.   Pulmonary/Chest: Effort normal.  Abdominal: Soft. Bowel sounds are normal. He exhibits no distension and no mass. There is tenderness. There is no rebound and no guarding.  Mildly tender across the entire abdomen with palpation  Musculoskeletal: He exhibits no edema.  Neurological: He is alert and oriented to person, place, and time.  mild tend to palp over upper and mid abd/no omeg    Results for orders placed in visit on 12/28/12  POCT CBC      Result Value Range   WBC 11.6 (*) 4.6 - 10.2 K/uL   Lymph, poc 2.5  0.6 - 3.4   POC LYMPH PERCENT 21.8  10 - 50 %L   MID (cbc) 0.6  0 - 0.9   POC MID % 5.5  0 - 12 %M   POC Granulocyte 8.4 (*) 2 - 6.9   Granulocyte percent 72.7  37 - 80 %G   RBC 4.48 (*) 4.69 -  6.13 M/uL   Hemoglobin 13.8 (*) 14.1 - 18.1 g/dL   HCT, POC 43.6  43.5 - 53.7 %   MCV 97.3 (*) 80 - 97 fL   MCH, POC 30.8  27 - 31.2 pg   MCHC 31.7 (*) 31.8 - 35.4 g/dL   RDW, POC 15.1     Platelet Count, POC 185  142 - 424 K/uL   MPV 10.2  0 - 99.8 fL       Assessment & Plan:   Diarrhea -infectious  If recurs will cult since wbc sl high  Soft diet/incr fluids/immod if desired

## 2012-12-29 DIAGNOSIS — R197 Diarrhea, unspecified: Secondary | ICD-10-CM | POA: Diagnosis not present

## 2012-12-29 NOTE — Addendum Note (Signed)
Addended by: Constance Goltz on: 12/29/2012 04:32 PM   Modules accepted: Orders

## 2012-12-30 ENCOUNTER — Telehealth: Payer: Self-pay

## 2012-12-30 NOTE — Telephone Encounter (Signed)
Spoke with wife. Stool culture still in process. Wife notifed

## 2012-12-30 NOTE — Telephone Encounter (Signed)
Wife is checking on the status of her husbands stool specimen.   Please call [phone]

## 2013-01-02 LAB — STOOL CULTURE

## 2013-01-09 ENCOUNTER — Other Ambulatory Visit: Payer: Self-pay

## 2013-01-09 DIAGNOSIS — C4442 Squamous cell carcinoma of skin of scalp and neck: Secondary | ICD-10-CM | POA: Diagnosis not present

## 2013-01-12 ENCOUNTER — Other Ambulatory Visit: Payer: Self-pay | Admitting: Internal Medicine

## 2013-01-12 ENCOUNTER — Ambulatory Visit (INDEPENDENT_AMBULATORY_CARE_PROVIDER_SITE_OTHER): Payer: Medicare Other | Admitting: *Deleted

## 2013-01-12 DIAGNOSIS — I255 Ischemic cardiomyopathy: Secondary | ICD-10-CM

## 2013-01-12 DIAGNOSIS — I428 Other cardiomyopathies: Secondary | ICD-10-CM | POA: Diagnosis not present

## 2013-01-12 DIAGNOSIS — I2589 Other forms of chronic ischemic heart disease: Secondary | ICD-10-CM | POA: Diagnosis not present

## 2013-01-12 LAB — REMOTE ICD DEVICE
BATTERY VOLTAGE: 2.49 V
BRDY-0002RV: 40 {beats}/min
DEV-0020ICD: NEGATIVE
DEVICE MODEL ICD: 255313
RV LEAD IMPEDENCE ICD: 440 Ohm
TZAT-0001SLOWVT: 1
TZAT-0004SLOWVT: 8
TZAT-0012FASTVT: 200 ms
TZAT-0012SLOWVT: 200 ms
TZAT-0013FASTVT: 1
TZAT-0018FASTVT: NEGATIVE
TZAT-0018SLOWVT: NEGATIVE
TZAT-0019FASTVT: 7.5 V
TZAT-0019SLOWVT: 7.5 V
TZAT-0020FASTVT: 1 ms
TZON-0003SLOWVT: 375 ms
TZON-0004FASTVT: 30
TZON-0004SLOWVT: 40
TZON-0005FASTVT: 6
TZON-0008FASTVT: 40 ms
TZON-0010SLOWVT: 80 ms
TZST-0001FASTVT: 2
TZST-0001FASTVT: 4
TZST-0001SLOWVT: 3
TZST-0001SLOWVT: 5
TZST-0003FASTVT: 25 J
TZST-0003FASTVT: 36 J
TZST-0003FASTVT: 36 J
TZST-0003SLOWVT: 36 J
TZST-0003SLOWVT: 36 J
VENTRICULAR PACING ICD: 1 pct

## 2013-01-13 NOTE — Telephone Encounter (Signed)
Ok to refill x 3 

## 2013-01-13 NOTE — Telephone Encounter (Signed)
Last filled on 09/03/12 #30 with 2 additional refills Has a future appointment on 04/02/13. Last seen on 12/01/12. Please advise. Thanks!

## 2013-01-22 NOTE — Progress Notes (Signed)
Remote defib received

## 2013-01-26 ENCOUNTER — Other Ambulatory Visit: Payer: Self-pay | Admitting: Internal Medicine

## 2013-02-04 ENCOUNTER — Other Ambulatory Visit: Payer: Self-pay

## 2013-02-04 ENCOUNTER — Telehealth: Payer: Self-pay | Admitting: Cardiology

## 2013-02-04 MED ORDER — RAMIPRIL 5 MG PO CAPS: ORAL_CAPSULE | ORAL | Status: DC

## 2013-02-04 NOTE — Telephone Encounter (Signed)
New message     Refill---ramipril 2.5mg  bid------[facility] [address]only want enough to last until 12-26 when he sees the doctor

## 2013-02-09 DIAGNOSIS — Z85828 Personal history of other malignant neoplasm of skin: Secondary | ICD-10-CM | POA: Diagnosis not present

## 2013-02-09 DIAGNOSIS — L570 Actinic keratosis: Secondary | ICD-10-CM | POA: Diagnosis not present

## 2013-02-09 DIAGNOSIS — L918 Other hypertrophic disorders of the skin: Secondary | ICD-10-CM | POA: Diagnosis not present

## 2013-02-16 ENCOUNTER — Other Ambulatory Visit: Payer: Self-pay | Admitting: Internal Medicine

## 2013-02-16 ENCOUNTER — Encounter: Payer: Self-pay | Admitting: Internal Medicine

## 2013-02-16 ENCOUNTER — Ambulatory Visit (INDEPENDENT_AMBULATORY_CARE_PROVIDER_SITE_OTHER): Payer: Medicare Other | Admitting: *Deleted

## 2013-02-16 DIAGNOSIS — Z95810 Presence of automatic (implantable) cardiac defibrillator: Secondary | ICD-10-CM

## 2013-02-22 LAB — MDC_IDC_ENUM_SESS_TYPE_REMOTE
Battery Remaining Percentage: 11 %
Brady Statistic RV Percent Paced: 1 %
HighPow Impedance: 32 Ohm
HighPow Impedance: 39 Ohm
Implantable Pulse Generator Serial Number: 255313
Lead Channel Impedance Value: 420 Ohm
Lead Channel Pacing Threshold Amplitude: 1 V
Lead Channel Sensing Intrinsic Amplitude: 12 mV
Lead Channel Setting Pacing Amplitude: 2 V
Lead Channel Setting Pacing Pulse Width: 0.5 ms
Lead Channel Setting Sensing Sensitivity: 0.3 mV

## 2013-03-02 ENCOUNTER — Telehealth: Payer: Self-pay | Admitting: Internal Medicine

## 2013-03-02 NOTE — Telephone Encounter (Signed)
Pt instructed to call in w/ the name of a meter that insurance will cover.   One touch ultra 2 . Strips and lancets/ 90 day supply of each [facility]/ [address]

## 2013-03-03 ENCOUNTER — Other Ambulatory Visit: Payer: Self-pay | Admitting: Family Medicine

## 2013-03-03 MED ORDER — ONETOUCH LANCETS MISC: Status: DC

## 2013-03-03 MED ORDER — GLUCOSE BLOOD VI STRP: ORAL_STRIP | Status: DC

## 2013-03-03 NOTE — Telephone Encounter (Signed)
Sent to the pharmacy by e-scribe. 

## 2013-03-09 ENCOUNTER — Telehealth: Payer: Self-pay | Admitting: Family Medicine

## 2013-03-09 NOTE — Telephone Encounter (Signed)
Received a fax from [facility] [facility].  Flomax is on back order.  A new prescription is needed temporarily for this patient.  Please advise.  Thanks!

## 2013-03-10 NOTE — Telephone Encounter (Signed)
Can try rapaflo 8 mg once a day disp 30 refill x 2  Instead of flomax

## 2013-03-13 ENCOUNTER — Other Ambulatory Visit: Payer: Self-pay | Admitting: *Deleted

## 2013-03-13 ENCOUNTER — Ambulatory Visit (INDEPENDENT_AMBULATORY_CARE_PROVIDER_SITE_OTHER): Payer: Medicare Other | Admitting: Cardiology

## 2013-03-13 ENCOUNTER — Encounter: Payer: Self-pay | Admitting: Cardiology

## 2013-03-13 VITALS — BP 118/60 | HR 57 | Ht 74.0 in | Wt 203.0 lb

## 2013-03-13 DIAGNOSIS — I679 Cerebrovascular disease, unspecified: Secondary | ICD-10-CM

## 2013-03-13 DIAGNOSIS — I2589 Other forms of chronic ischemic heart disease: Secondary | ICD-10-CM

## 2013-03-13 DIAGNOSIS — I2510 Atherosclerotic heart disease of native coronary artery without angina pectoris: Secondary | ICD-10-CM

## 2013-03-13 DIAGNOSIS — I255 Ischemic cardiomyopathy: Secondary | ICD-10-CM

## 2013-03-13 DIAGNOSIS — I5022 Chronic systolic (congestive) heart failure: Secondary | ICD-10-CM | POA: Diagnosis not present

## 2013-03-13 LAB — HEPATIC FUNCTION PANEL
ALT: 17 U/L (ref 0–53)
AST: 17 U/L (ref 0–37)
Albumin: 4 g/dL (ref 3.5–5.2)
Alkaline Phosphatase: 49 U/L (ref 39–117)
Bilirubin, Direct: 0.1 mg/dL (ref 0.0–0.3)
Total Protein: 6.5 g/dL (ref 6.0–8.3)

## 2013-03-13 LAB — LIPID PANEL
Cholesterol: 75 mg/dL (ref 0–200)
HDL: 33.8 mg/dL — ABNORMAL LOW (ref >39.00)
LDL Cholesterol: 16 mg/dL (ref 0–99)
Total CHOL/HDL Ratio: 2

## 2013-03-13 LAB — BASIC METABOLIC PANEL
BUN: 40 mg/dL — ABNORMAL HIGH (ref 6–23)
Calcium: 9.6 mg/dL (ref 8.4–10.5)
Chloride: 104 mEq/L (ref 96–112)
Creatinine, Ser: 1.7 mg/dL — ABNORMAL HIGH (ref 0.4–1.5)
GFR: 42.28 mL/min — ABNORMAL LOW (ref >60.00)

## 2013-03-13 MED ORDER — SPIRONOLACTONE 25 MG PO TABS: 25.0000 mg | ORAL_TABLET | Freq: Every day | ORAL | Status: DC

## 2013-03-13 MED ORDER — SILODOSIN 8 MG PO CAPS: 8.0000 mg | ORAL_CAPSULE | Freq: Every day | ORAL | Status: DC

## 2013-03-13 MED ORDER — FUROSEMIDE 40 MG PO TABS: 20.0000 mg | ORAL_TABLET | Freq: Every day | ORAL | Status: DC

## 2013-03-13 MED ORDER — RAMIPRIL 5 MG PO CAPS: ORAL_CAPSULE | ORAL | Status: DC

## 2013-03-13 MED ORDER — FUROSEMIDE 40 MG PO TABS: 40.0000 mg | ORAL_TABLET | Freq: Every day | ORAL | Status: DC

## 2013-03-13 MED ORDER — SPIRONOLACTONE 25 MG PO TABS: 12.5000 mg | ORAL_TABLET | Freq: Every day | ORAL | Status: DC

## 2013-03-13 MED ORDER — ATORVASTATIN CALCIUM 40 MG PO TABS: 40.0000 mg | ORAL_TABLET | Freq: Every day | ORAL | Status: DC

## 2013-03-13 MED ORDER — NIACIN ER (ANTIHYPERLIPIDEMIC) 1000 MG PO TBCR: 1000.0000 mg | EXTENDED_RELEASE_TABLET | Freq: Every day | ORAL | Status: DC

## 2013-03-13 MED ORDER — DIGOXIN 125 MCG PO TABS: 125.0000 ug | ORAL_TABLET | Freq: Every day | ORAL | Status: DC

## 2013-03-13 MED ORDER — CARVEDILOL 12.5 MG PO TABS: 12.5000 mg | ORAL_TABLET | Freq: Two times a day (BID) | ORAL | Status: DC

## 2013-03-13 NOTE — Assessment & Plan Note (Signed)
Continue ACE inhibitor and beta blocker. 

## 2013-03-13 NOTE — Assessment & Plan Note (Signed)
Continue present dose of Lasix. Check potassium and renal function. 

## 2013-03-13 NOTE — Assessment & Plan Note (Signed)
Blood pressure controlled. Continue present medications. Check potassium and renal function. 

## 2013-03-13 NOTE — Progress Notes (Signed)
HPI: FU coronary artery disease, status post coronary bypassing graft, ischemic cardiomyopathy, CHF, and status post ICD. His last echocardiogram was performed in Sept 2011. At that time, the ejection fraction was 25% with mild aortic insufficiency and trivial mitral regurgitation. His last Myoview in Sept 2011 showed an ejection fraction of 28% with a prior infarct, but there was no ischemia. His last carotid Dopplers in Dec 2012 showed a 60-79% right and 40-59% left carotid stenosis. Followup was recommended in six months. I last saw him in Feb 2012. Since then, the patient has dyspnea with more extreme activities but not with routine activities. It is relieved with rest. It is not associated with chest pain. There is no orthopnea, PND or pedal edema. There is no syncope or palpitations. There is no exertional chest pain.   Current Outpatient Prescriptions  Medication Sig Dispense Refill  . aspirin EC 81 MG EC tablet Take 81 mg by mouth daily.        [name] atorvastatin (LIPITOR) 40 MG tablet Take 1 tablet (40 mg total) by mouth daily.  90 tablet  3  . Benfotiamine 150 MG CAPS Take 300 mg by mouth at bedtime.      . carvedilol (COREG) 12.5 MG tablet Take 1 tablet (12.5 mg total) by mouth 2 (two) times daily with a meal.  180 tablet  3  . clonazePAM (KLONOPIN) 1 MG tablet TAKE 1 TABLET BY MOUTH EVERY NIGHT AT BEDTIME OR AS DIRECTED  30 tablet  2  . digoxin (LANOXIN) 0.125 MG tablet Take 1 tablet (125 mcg total) by mouth daily.  90 tablet  3  . escitalopram (LEXAPRO) 10 MG tablet Take 1 tablet (10 mg total) by mouth daily.  90 tablet  3  . furosemide (LASIX) 40 MG tablet Take 1 tablet (40 mg total) by mouth daily.  90 tablet  3  . glipiZIDE (GLUCOTROL) 10 MG tablet Take 1 tablet (10 mg total) by mouth 2 (two) times daily before a meal.  270 tablet  3  . glucose blood (ACCU-CHEK COMFORT CURVE) test strip Use as instructed  100 each  11  . glucose blood test strip Use as instructed  100 each  11    . Insulin Detemir (LEVEMIR FLEXTOUCH) 100 UNIT/ML SOPN Inject 20 units at bedtime or as directed  1 pen  5  . ipratropium (ATROVENT) 0.03 % nasal spray Place 2 sprays into the nose 2 (two) times daily.  90 mL  3  . Lancets (ACCU-CHEK SOFT TOUCH) lancets Use as instructed  100 each  12  . metFORMIN (GLUCOPHAGE) 1000 MG tablet Take 1 tablet (1,000 mg total) by mouth 2 (two) times daily with a meal.  180 tablet  3  . niacin (NIASPAN) 1000 MG CR tablet Take 1,000 mg by mouth at bedtime.       . ONE TOUCH LANCETS MISC Use as directed  200 each  11  . pramipexole (MIRAPEX) 0.5 MG tablet Take 1 tablet (0.5 mg total) by mouth daily. For restless legs  90 tablet  3  . ramipril (ALTACE) 5 MG capsule Take 1/2 tablet twice a day  120 capsule  0  . saxagliptin HCl (ONGLYZA) 5 MG TABS tablet Take 1 tablet (5 mg total) by mouth daily.  90 tablet  3  . spironolactone (ALDACTONE) 25 MG tablet Take 1 tablet (25 mg total) by mouth daily.  90 tablet  3  . silodosin (RAPAFLO) 8 MG CAPS capsule Take  1 capsule (8 mg total) by mouth daily with breakfast.  90 capsule  0   No current facility-administered medications for this visit.     Past Medical History  Diagnosis Date  . ARTHRITIS, HIP   . Autoimmune hemolytic anemias   . CEREBROVASCULAR DISEASE   . Ischemic cardiomyopathy     S/P CABG; EF 201-25% 11/2009  . ICD (implantable cardiac defibrillator) in place     [facility]--implanted 2005  . DIABETES MELLITUS, TYPE II   . Enlargement of lymph nodes   . HYPERLIPIDEMIA   . HYPERTENSION   . HYPOTHYROIDISM   . LESION, FACE   . Nocturia   . RESTLESS LEG SYNDROME   . UNSPECIFIED ANEMIA   . VITAMIN D DEFICIENCY   . WEIGHT LOSS   . OBSTRUCTIVE SLEEP APNEA 07/19/2009    Past Surgical History  Procedure Laterality Date  . Cataract extraction    . Cholecystectomy    . Cardiac defibrillator placement    . Ventricular resection / repair aneurysm      left   . Cabg      History   Social History  .  Marital Status: Married    Spouse Name: N/A    Number of Children: N/A  . Years of Education: N/A   Occupational History  . Not on file.   Social History Main Topics  . Smoking status: Former Smoker -- 1.00 packs/day for 40 years    Types: Cigarettes    Quit date: 03/19/1978  . Smokeless tobacco: Current User  . Alcohol Use: No  . Drug Use: No  . Sexual Activity: No   Other Topics Concern  . Not on file   Social History Narrative   hhof 2 married   No pets     In [place] for over 93 years.       Retired Education officer, museum.    ROS: no fevers or chills, productive cough, hemoptysis, dysphasia, odynophagia, melena, hematochezia, dysuria, hematuria, rash, seizure activity, orthopnea, PND, pedal edema, claudication. Remaining systems are negative.  Physical Exam: Well-developed well-nourished in no acute distress.  Skin is warm and dry.  HEENT is normal.  Neck is supple.  Chest is clear to auscultation with normal expansion.  Cardiovascular exam is regular rate and rhythm.  Abdominal exam nontender or distended. No masses palpated. Extremities show no edema. neuro grossly intact  ECG Sinus rhythm, LBBB

## 2013-03-13 NOTE — Patient Instructions (Signed)
Your physician wants you to follow-up in: [name] will receive a reminder letter in the mail two months in advance. If you don't receive a letter, please call our office to schedule the follow-up appointment.   Your physician has requested that you have a carotid duplex. This test is an ultrasound of the carotid arteries in your neck. It looks at blood flow through these arteries that supply the brain with blood. Allow one hour for this exam. There are no restrictions or special instructions.   Your physician recommends that you HAVE LAB WORK TODAY

## 2013-03-13 NOTE — Assessment & Plan Note (Signed)
Followed by electrophysiology. 

## 2013-03-13 NOTE — Telephone Encounter (Signed)
Called and spoke with pt and pt's wife and they are both aware new rx sent to [place].

## 2013-03-13 NOTE — Assessment & Plan Note (Signed)
Continue aspirin and statin. 

## 2013-03-13 NOTE — Assessment & Plan Note (Signed)
Continue aspirin and statin. Schedule followup carotid Dopplers. 

## 2013-03-13 NOTE — Assessment & Plan Note (Signed)
Continue statin. Check lipids and liver. 

## 2013-03-17 ENCOUNTER — Other Ambulatory Visit: Payer: Self-pay | Admitting: *Deleted

## 2013-03-17 ENCOUNTER — Encounter: Payer: Self-pay | Admitting: *Deleted

## 2013-03-17 MED ORDER — RAMIPRIL 2.5 MG PO CAPS: 2.5000 mg | ORAL_CAPSULE | Freq: Two times a day (BID) | ORAL | Status: DC

## 2013-03-18 ENCOUNTER — Other Ambulatory Visit (INDEPENDENT_AMBULATORY_CARE_PROVIDER_SITE_OTHER): Payer: Medicare Other

## 2013-03-18 ENCOUNTER — Encounter: Payer: Self-pay | Admitting: Cardiology

## 2013-03-18 ENCOUNTER — Ambulatory Visit (HOSPITAL_COMMUNITY): Payer: Medicare Other | Attending: Cardiology

## 2013-03-18 DIAGNOSIS — Z951 Presence of aortocoronary bypass graft: Secondary | ICD-10-CM | POA: Diagnosis not present

## 2013-03-18 DIAGNOSIS — I10 Essential (primary) hypertension: Secondary | ICD-10-CM | POA: Insufficient documentation

## 2013-03-18 DIAGNOSIS — I2510 Atherosclerotic heart disease of native coronary artery without angina pectoris: Secondary | ICD-10-CM

## 2013-03-18 DIAGNOSIS — E119 Type 2 diabetes mellitus without complications: Secondary | ICD-10-CM | POA: Insufficient documentation

## 2013-03-18 DIAGNOSIS — I6529 Occlusion and stenosis of unspecified carotid artery: Secondary | ICD-10-CM | POA: Diagnosis not present

## 2013-03-18 DIAGNOSIS — I658 Occlusion and stenosis of other precerebral arteries: Secondary | ICD-10-CM | POA: Insufficient documentation

## 2013-03-18 DIAGNOSIS — E785 Hyperlipidemia, unspecified: Secondary | ICD-10-CM | POA: Insufficient documentation

## 2013-03-18 DIAGNOSIS — I679 Cerebrovascular disease, unspecified: Secondary | ICD-10-CM

## 2013-03-18 DIAGNOSIS — Z87891 Personal history of nicotine dependence: Secondary | ICD-10-CM | POA: Diagnosis not present

## 2013-03-18 LAB — BASIC METABOLIC PANEL
BUN: 27 mg/dL — ABNORMAL HIGH (ref 6–23)
CO2: 28 mEq/L (ref 19–32)
Chloride: 106 mEq/L (ref 96–112)
Glucose, Bld: 265 mg/dL — ABNORMAL HIGH (ref 70–99)
Potassium: 4.8 mEq/L (ref 3.5–5.1)
Sodium: 139 mEq/L (ref 135–145)

## 2013-03-23 ENCOUNTER — Ambulatory Visit (INDEPENDENT_AMBULATORY_CARE_PROVIDER_SITE_OTHER): Payer: Medicare Other | Admitting: *Deleted

## 2013-03-23 DIAGNOSIS — I255 Ischemic cardiomyopathy: Secondary | ICD-10-CM

## 2013-03-23 DIAGNOSIS — I2589 Other forms of chronic ischemic heart disease: Secondary | ICD-10-CM

## 2013-03-23 DIAGNOSIS — I5022 Chronic systolic (congestive) heart failure: Secondary | ICD-10-CM

## 2013-03-26 ENCOUNTER — Other Ambulatory Visit (INDEPENDENT_AMBULATORY_CARE_PROVIDER_SITE_OTHER): Payer: Medicare Other

## 2013-03-26 DIAGNOSIS — E1142 Type 2 diabetes mellitus with diabetic polyneuropathy: Secondary | ICD-10-CM | POA: Diagnosis not present

## 2013-03-26 DIAGNOSIS — E1140 Type 2 diabetes mellitus with diabetic neuropathy, unspecified: Secondary | ICD-10-CM

## 2013-03-26 DIAGNOSIS — E785 Hyperlipidemia, unspecified: Secondary | ICD-10-CM

## 2013-03-26 DIAGNOSIS — D509 Iron deficiency anemia, unspecified: Secondary | ICD-10-CM | POA: Diagnosis not present

## 2013-03-26 DIAGNOSIS — E1149 Type 2 diabetes mellitus with other diabetic neurological complication: Secondary | ICD-10-CM

## 2013-03-26 LAB — CBC WITH DIFFERENTIAL/PLATELET
BASOS ABS: 0 10*3/uL (ref 0.0–0.1)
Basophils Relative: 0.2 % (ref 0.0–3.0)
EOS PCT: 1.8 % (ref 0.0–5.0)
Eosinophils Absolute: 0.2 10*3/uL (ref 0.0–0.7)
HEMATOCRIT: 39.7 % (ref 39.0–52.0)
HEMOGLOBIN: 13.2 g/dL (ref 13.0–17.0)
LYMPHS ABS: 2.2 10*3/uL (ref 0.7–4.0)
Lymphocytes Relative: 21 % (ref 12.0–46.0)
MCHC: 33.3 g/dL (ref 30.0–36.0)
MCV: 95.3 fl (ref 78.0–100.0)
MONO ABS: 0.7 10*3/uL (ref 0.1–1.0)
Monocytes Relative: 7.1 % (ref 3.0–12.0)
Neutro Abs: 7.2 10*3/uL (ref 1.4–7.7)
Neutrophils Relative %: 69.9 % (ref 43.0–77.0)
PLATELETS: 137 10*3/uL — AB (ref 150.0–400.0)
RBC: 4.17 Mil/uL — ABNORMAL LOW (ref 4.22–5.81)
RDW: 13.3 % (ref 11.5–14.6)
WBC: 10.3 10*3/uL (ref 4.5–10.5)

## 2013-03-26 LAB — BASIC METABOLIC PANEL
BUN: 26 mg/dL — AB (ref 6–23)
CHLORIDE: 106 meq/L (ref 96–112)
CO2: 30 mEq/L (ref 19–32)
Calcium: 9 mg/dL (ref 8.4–10.5)
Creatinine, Ser: 1.3 mg/dL (ref 0.4–1.5)
GFR: 56.06 mL/min — ABNORMAL LOW (ref >60.00)
GLUCOSE: 117 mg/dL — AB (ref 70–99)
Potassium: 4.1 mEq/L (ref 3.5–5.1)
Sodium: 143 mEq/L (ref 135–145)

## 2013-03-26 LAB — FERRITIN: Ferritin: 16.8 ng/mL — ABNORMAL LOW (ref 22.0–322.0)

## 2013-03-26 LAB — HEMOGLOBIN A1C: Hgb A1c MFr Bld: 7.3 % — ABNORMAL HIGH (ref 4.6–6.5)

## 2013-03-31 LAB — MDC_IDC_ENUM_SESS_TYPE_REMOTE
Battery Remaining Percentage: 6 %
Battery Voltage: 2.42 V
Brady Statistic RV Percent Paced: 1 %
Date Time Interrogation Session: 20150105150800
HIGH POWER IMPEDANCE MEASURED VALUE: 39 Ohm
HighPow Impedance: 32 Ohm
Lead Channel Sensing Intrinsic Amplitude: 12 mV
Lead Channel Setting Pacing Pulse Width: 0.5 ms
MDC IDC MSMT LEADCHNL RV IMPEDANCE VALUE: 400 Ohm
MDC IDC PG SERIAL: 255313
MDC IDC SET LEADCHNL RV PACING AMPLITUDE: 2 V
MDC IDC SET LEADCHNL RV SENSING SENSITIVITY: 0.3 mV
Zone Setting Detection Interval: 250 ms
Zone Setting Detection Interval: 300 ms
Zone Setting Detection Interval: 375 ms

## 2013-04-02 ENCOUNTER — Ambulatory Visit (INDEPENDENT_AMBULATORY_CARE_PROVIDER_SITE_OTHER): Payer: Medicare Other | Admitting: Internal Medicine

## 2013-04-02 ENCOUNTER — Encounter: Payer: Self-pay | Admitting: Internal Medicine

## 2013-04-02 VITALS — BP 136/66 | Temp 98.0°F | Ht 71.5 in | Wt 210.0 lb

## 2013-04-02 DIAGNOSIS — I2510 Atherosclerotic heart disease of native coronary artery without angina pectoris: Secondary | ICD-10-CM

## 2013-04-02 DIAGNOSIS — R259 Unspecified abnormal involuntary movements: Secondary | ICD-10-CM

## 2013-04-02 DIAGNOSIS — E1149 Type 2 diabetes mellitus with other diabetic neurological complication: Secondary | ICD-10-CM | POA: Diagnosis not present

## 2013-04-02 DIAGNOSIS — N289 Disorder of kidney and ureter, unspecified: Secondary | ICD-10-CM

## 2013-04-02 DIAGNOSIS — R258 Other abnormal involuntary movements: Secondary | ICD-10-CM

## 2013-04-02 DIAGNOSIS — E1140 Type 2 diabetes mellitus with diabetic neuropathy, unspecified: Secondary | ICD-10-CM

## 2013-04-02 DIAGNOSIS — I2589 Other forms of chronic ischemic heart disease: Secondary | ICD-10-CM

## 2013-04-02 DIAGNOSIS — Z23 Encounter for immunization: Secondary | ICD-10-CM

## 2013-04-02 DIAGNOSIS — I255 Ischemic cardiomyopathy: Secondary | ICD-10-CM

## 2013-04-02 DIAGNOSIS — L84 Corns and callosities: Secondary | ICD-10-CM

## 2013-04-02 NOTE — Progress Notes (Signed)
Chief Complaint  Patient presents with  . Follow-up    HPI: Patient comes in today for follow up of  multiple medical problems.    Cards fu carotid dopplesre this month 6-12 month fu  No change in meds   DM no low sugars sugars have actually been up over the holidays because he had a lot more sweets. No change in medicines some sugars have been in the 225 range fasting.  Due for defibrillator check.  Legs at night are somewhat better takes mustard before he goes to bed. Also taking his clonazepam.  No falling   Checks feet clips own nails denies problem    ROS: See pertinent positives and negatives per HPI. States that his memories about the same and fairly well. No cough syncope new numbness vision change  Past Medical History  Diagnosis Date  . ARTHRITIS, HIP   . Autoimmune hemolytic anemias   . CEREBROVASCULAR DISEASE   . Ischemic cardiomyopathy     S/P CABG; EF 201-25% 11/2009  . ICD (implantable cardiac defibrillator) in place     [facility]--implanted 2005  . DIABETES MELLITUS, TYPE II   . Enlargement of lymph nodes   . HYPERLIPIDEMIA   . HYPERTENSION   . HYPOTHYROIDISM   . LESION, FACE   . Nocturia   . RESTLESS LEG SYNDROME   . UNSPECIFIED ANEMIA   . VITAMIN D DEFICIENCY   . WEIGHT LOSS   . OBSTRUCTIVE SLEEP APNEA 07/19/2009  . Hx of frostbite     [place] 1950 face and digits     Family History  Problem Relation Age of Onset  . COPD Father     History   Social History  . Marital Status: Married    Spouse Name: N/A    Number of Children: N/A  . Years of Education: N/A   Social History Main Topics  . Smoking status: Former Smoker -- 1.00 packs/day for 40 years    Types: Cigarettes    Quit date: 03/19/1978  . Smokeless tobacco: Current User  . Alcohol Use: No  . Drug Use: No  . Sexual Activity: No   Other Topics Concern  . None   Social History Narrative   hhof 2 married   No pets     In [place] for over [place] years.       Retired Architect.   [place]    Outpatient Encounter Prescriptions as of 04/02/2013  Medication Sig  . aspirin EC 81 MG EC tablet Take 81 mg by mouth daily.    [name] atorvastatin (LIPITOR) 40 MG tablet Take 1 tablet (40 mg total) by mouth daily.  . carvedilol (COREG) 12.5 MG tablet Take 1 tablet (12.5 mg total) by mouth 2 (two) times daily with a meal.  . clonazePAM (KLONOPIN) 1 MG tablet TAKE 1 TABLET BY MOUTH EVERY NIGHT AT BEDTIME OR AS DIRECTED  . escitalopram (LEXAPRO) 10 MG tablet Take 1 tablet (10 mg total) by mouth daily.  . furosemide (LASIX) 40 MG tablet Take 0.5 tablets (20 mg total) by mouth daily.  [name] glipiZIDE (GLUCOTROL) 10 MG tablet Take 1 tablet (10 mg total) by mouth 2 (two) times daily before a meal.  . glucose blood (ACCU-CHEK COMFORT CURVE) test strip Use as instructed  . glucose blood test strip Use as instructed  . Insulin Detemir (LEVEMIR FLEXTOUCH) 100 UNIT/ML SOPN Inject 20 units at bedtime or as directed  . ipratropium (ATROVENT) 0.03 % nasal  spray Place 2 sprays into the nose 2 (two) times daily.  . Lancets (ACCU-CHEK SOFT TOUCH) lancets Use as instructed  . metFORMIN (GLUCOPHAGE) 1000 MG tablet Take 1 tablet (1,000 mg total) by mouth 2 (two) times daily with a meal.  . niacin (NIASPAN) 500 MG CR tablet Take 500 mg by mouth at bedtime.  . ONE TOUCH LANCETS MISC Use as directed  . pramipexole (MIRAPEX) 0.5 MG tablet Take 1 tablet (0.5 mg total) by mouth daily. For restless legs  . ramipril (ALTACE) 2.5 MG capsule Take 1 capsule (2.5 mg total) by mouth 2 (two) times daily.  . saxagliptin HCl (ONGLYZA) 5 MG TABS tablet Take 1 tablet (5 mg total) by mouth daily.  . silodosin (RAPAFLO) 8 MG CAPS capsule Take 1 capsule (8 mg total) by mouth daily with breakfast.  . spironolactone (ALDACTONE) 25 MG tablet Take 0.5 tablets (12.5 mg total) by mouth daily.  . Benfotiamine 150 MG CAPS Take 300 mg by mouth at bedtime.  . [DISCONTINUED] niacin (NIASPAN) 1000 MG CR tablet Take 1  tablet (1,000 mg total) by mouth at bedtime.    EXAM:  BP 136/66  Temp(Src) 98 F (36.7 C) (Oral)  Ht 5' 11.5" (1.816 m)  Wt 210 lb (95.255 kg)  BMI 28.88 kg/m2  Body mass index is 28.88 kg/(m^2).  GENERAL: vitals reviewed and listed above, alert, oriented, appears well hydrated and in no acute distress looks well today he has hearing aids in HEENT: atraumatic, conjunctiva  clear, no obvious abnormalities on inspection of external nose and ears  NECK: no obvious masses on inspection palpation  LUNGS: clear to auscultation bilaterally, no wheezes, rales or rhonchi, CV: HRRR, no clubbing cyanosis trace to +1 peripheral edema nl cap refill  MS: moves all extremities without noticeable focal  abnormality PSYCH: pleasant and cooperative, no obvious depression or anxiety See feet exam scaling on sore of foot dallous dorn 4th toe pulse present thickened toenails no redness  Lab Results  Component Value Date   WBC 10.3 03/26/2013   HGB 13.2 03/26/2013   HCT 39.7 03/26/2013   PLT 137.0* 03/26/2013   GLUCOSE 117* 03/26/2013   CHOL 75 03/13/2013   TRIG 127.0 03/13/2013   HDL 33.80* 03/13/2013   LDLCALC 16 03/13/2013   ALT 17 03/13/2013   AST 17 03/13/2013   NA 143 03/26/2013   K 4.1 03/26/2013   CL 106 03/26/2013   CREATININE 1.3 03/26/2013   BUN 26* 03/26/2013   CO2 30 03/26/2013   TSH 1.39 08/27/2012   PSA 2.47 08/02/2010   INR 1.11 12/16/2009   HGBA1C 7.3* 03/26/2013   MICROALBUR 0.6 08/14/2011    ASSESSMENT AND PLAN:  Discussed the following assessment and plan:  Diabetic neuropathy, type II diabetes mellitus - elevaetd readings from hiloday indiscretions  will chdo dietary chagnes no med change at this time  as a1c id below 8  - Plan: Ambulatory referral to Podiatry  Nocturnal leg movements  Ischemic cardiomyopathy  Renal insufficiency  Need for vaccination with 13-polyvalent pneumococcal conjugate vaccine - Plan: Pneumococcal conjugate vaccine 13-valent  Corns/callosities - feet with  diabetes  [place] of 4tyh toe  no ulcer plan podiatry care opinion - Plan: Ambulatory referral to Podiatry  CAD (coronary artery disease) prevenar  -Patient advised to return or notify health care team  if symptoms worsen or persist or new concerns arise.  Patient Instructions  Avoid sugars and sweets. To get blood sugar down to better range . Will be contacted  about getting a podatrist to check your feet. Recheck hg a1c in 4 months and then rov.     Standley Brooking. Panosh M.D.  Pre visit review using our clinic review tool, if applicable. No additional management support is needed unless otherwise documented below in the visit note.

## 2013-04-02 NOTE — Patient Instructions (Signed)
Avoid sugars and sweets. To get blood sugar down to better range . Will be contacted about getting a podatrist to check your feet. Recheck hg a1c in 4 months and then rov.

## 2013-04-03 ENCOUNTER — Telehealth: Payer: Self-pay

## 2013-04-03 NOTE — Telephone Encounter (Signed)
Relevant patient education mailed to patient.  

## 2013-04-06 ENCOUNTER — Encounter: Payer: Self-pay | Admitting: Internal Medicine

## 2013-04-16 ENCOUNTER — Ambulatory Visit (INDEPENDENT_AMBULATORY_CARE_PROVIDER_SITE_OTHER): Payer: Medicare Other | Admitting: Internal Medicine

## 2013-04-16 ENCOUNTER — Encounter: Payer: Self-pay | Admitting: Internal Medicine

## 2013-04-16 VITALS — BP 138/64 | HR 59 | Ht 74.0 in | Wt 212.4 lb

## 2013-04-16 DIAGNOSIS — Z01812 Encounter for preprocedural laboratory examination: Secondary | ICD-10-CM

## 2013-04-16 DIAGNOSIS — I255 Ischemic cardiomyopathy: Secondary | ICD-10-CM

## 2013-04-16 DIAGNOSIS — I2589 Other forms of chronic ischemic heart disease: Secondary | ICD-10-CM | POA: Diagnosis not present

## 2013-04-16 DIAGNOSIS — Z4502 Encounter for adjustment and management of automatic implantable cardiac defibrillator: Secondary | ICD-10-CM | POA: Diagnosis not present

## 2013-04-16 DIAGNOSIS — Z95810 Presence of automatic (implantable) cardiac defibrillator: Secondary | ICD-10-CM

## 2013-04-16 NOTE — Patient Instructions (Signed)
Your physician has requested that you have a lexiscan myoview. For further information please visit [email or web address]. Please follow instruction sheet, as given.  Your physician has recommended that you have a defibrillator generator change   --  Arav Bannister, RN will be in touch next week to set up procedure date   Possible procedure dates: 2/11, 2/20, 2/27, 3/2  Your physician recommends that you continue on your current medications as directed. Please refer to the Current Medication list given to you today.

## 2013-04-16 NOTE — Assessment & Plan Note (Signed)
The patient's device was interrogated.  The information was reviewed. No changes were made in the programming.    

## 2013-04-16 NOTE — Assessment & Plan Note (Signed)
Having increasing symptoms of heart failure manifested by dyspnea and fatigue. There is only modest volume overload. Interrogation of his device demonstrates chronotropic incompetence with an inability to accomplish any reasonable number of heart disease at greater than 65% of his predicted max. He also has left bundle branch block and left axis deviation with a QRS of 170+ which would suggest a reasonable likelihood of improvement with resynchronization.  Prior to that however will undertake Myoview scanning to make sure there is no evidence of ischemia.  At the time of generator replacement will anticipate upgrade to CRT device we'll further chronotropic incompetence as well as left bundle branch block. We had a lengthy discussion regarding end-of-life issues of defibrillator therapy versus simply resynchronization. He and his wife are both in agreement that they would like to proceed with ICD implantation  We have reviewed the benefits and risks of generator replacement.  These include but are not limited to lead fracture and infection.  The patient understands, agrees and is willing to proceed.

## 2013-04-16 NOTE — Progress Notes (Signed)
Patient Care Team: Burnis Medin, MD as PCP - General (Internal Medicine) Johna Sheriff, MD (Ophthalmology) Lelon Perla, MD (Cardiology) Deboraha Sprang, MD (Cardiology) Chesley Mires, MD (Pulmonary Disease) [name]   HPI  [name] is a 80 y.o. male seen in followup for an ICD implanted in the setting of  ischemic heart disease prior bypass surgery and chronic systolic failure.  His last echocardiogram was performed in September 2011 when he presented with congestive heart failure. It demonstrated diffuse wall motion abnormalities and an ejection fraction of 20-25% His last Myoview in March of 2009 showed an ejection fraction of 25% with a prior anterior, apical, and inferoapical infarct, but there was no ischemia.  The patient denies chest pain,   nocturnal dyspnea, orthopnea or peripheral edema. There have been no palpitations, lightheadedness or syncope.   Is any increasing problems with shortness of breath. He is dyspneic at less than 100 yards. He does not have nocturnal dyspnea. He's had no peripheral edema.   His device has reached ERI  He told me the story last year of having been trapped by the [place] in [place] and taking 12 days of day and night fighting to breakthrough; three quarters of the man of the 155 didn't survive   Past Medical History  Diagnosis Date  . ARTHRITIS, HIP   . Autoimmune hemolytic anemias   . CEREBROVASCULAR DISEASE   . Ischemic cardiomyopathy     S/P CABG; EF 201-25% 11/2009  . ICD (implantable cardiac defibrillator) in place     [facility]--implanted 2005  . DIABETES MELLITUS, TYPE II   . Enlargement of lymph nodes   . HYPERLIPIDEMIA   . HYPERTENSION   . HYPOTHYROIDISM   . LESION, FACE   . Nocturia   . RESTLESS LEG SYNDROME   . UNSPECIFIED ANEMIA   . VITAMIN D DEFICIENCY   . WEIGHT LOSS   . OBSTRUCTIVE SLEEP APNEA 07/19/2009  . Hx of frostbite     [place] 1950 face and digits     Past Surgical History    Procedure Laterality Date  . Cataract extraction    . Cholecystectomy    . Cardiac defibrillator placement    . Ventricular resection / repair aneurysm      left   . Cabg      Current Outpatient Prescriptions  Medication Sig Dispense Refill  . aspirin EC 81 MG EC tablet Take 81 mg by mouth daily.        [name] atorvastatin (LIPITOR) 40 MG tablet Take 1 tablet (40 mg total) by mouth daily.  90 tablet  3  . Benfotiamine 150 MG CAPS Take 300 mg by mouth at bedtime.      . carvedilol (COREG) 12.5 MG tablet Take 1 tablet (12.5 mg total) by mouth 2 (two) times daily with a meal.  180 tablet  3  . clonazePAM (KLONOPIN) 1 MG tablet TAKE 1 TABLET BY MOUTH EVERY NIGHT AT BEDTIME OR AS DIRECTED  30 tablet  2  . escitalopram (LEXAPRO) 10 MG tablet Take 1 tablet (10 mg total) by mouth daily.  90 tablet  3  . furosemide (LASIX) 40 MG tablet Take 0.5 tablets (20 mg total) by mouth daily.  90 tablet  3  . glipiZIDE (GLUCOTROL) 10 MG tablet Take 1 tablet (10 mg total) by mouth 2 (two) times daily before a meal.  270 tablet  3  . glucose blood (ACCU-CHEK COMFORT  CURVE) test strip Use as instructed  100 each  11  . glucose blood test strip Use as instructed  100 each  11  . Insulin Detemir (LEVEMIR FLEXTOUCH) 100 UNIT/ML SOPN Inject 20 units at bedtime or as directed  1 pen  5  . ipratropium (ATROVENT) 0.03 % nasal spray Place 2 sprays into the nose 2 (two) times daily.  90 mL  3  . metFORMIN (GLUCOPHAGE) 1000 MG tablet Take 1 tablet (1,000 mg total) by mouth 2 (two) times daily with a meal.  180 tablet  3  . niacin (NIASPAN) 500 MG CR tablet Take 500 mg by mouth at bedtime.      . ONE TOUCH LANCETS MISC Use as directed  200 each  11  . pramipexole (MIRAPEX) 0.5 MG tablet Take 1 tablet (0.5 mg total) by mouth daily. For restless legs  90 tablet  3  . ramipril (ALTACE) 2.5 MG capsule Take 1 capsule (2.5 mg total) by mouth 2 (two) times daily.  180 capsule  1  . saxagliptin HCl (ONGLYZA) 5 MG TABS tablet Take 1  tablet (5 mg total) by mouth daily.  90 tablet  3  . silodosin (RAPAFLO) 8 MG CAPS capsule Take 1 capsule (8 mg total) by mouth daily with breakfast.  90 capsule  0  . spironolactone (ALDACTONE) 25 MG tablet Take 0.5 tablets (12.5 mg total) by mouth daily.  90 tablet  3   No current facility-administered medications for this visit.    No Known Allergies  Review of Systems negative except from HPI and PMH  Physical Exam BP 138/64  Pulse 59  Ht 6\' 2"  (1.88 m)  Wt 212 lb 6.4 oz (96.344 kg)  BMI 27.26 kg/m2 Well developed and well nourished in no acute distress HENT normal E scleral and icterus clear Neck Supple JVP flat; carotids brisk and full Clear to ausculation Slow but Regular rate and rhythm, no murmurs gallops or rub Soft with active bowel sounds No clubbing cyanosis none Edema Alert and oriented, grossly normal motor and sensory function Skin Warm and Dry The patient's device was interrogated.  The information was reviewed. No changes were made in the programming.    ECG demonstrates sinus rhythm and left bundle branch block intervals 19/17/42@axis  is left at 154  Assessment and  Plan

## 2013-04-16 NOTE — Assessment & Plan Note (Signed)
As above.

## 2013-04-20 ENCOUNTER — Encounter: Payer: Medicare Other | Admitting: Internal Medicine

## 2013-04-21 ENCOUNTER — Ambulatory Visit (INDEPENDENT_AMBULATORY_CARE_PROVIDER_SITE_OTHER): Payer: Medicare Other

## 2013-04-21 VITALS — BP 135/64 | HR 64 | Resp 18

## 2013-04-21 DIAGNOSIS — E1140 Type 2 diabetes mellitus with diabetic neuropathy, unspecified: Secondary | ICD-10-CM

## 2013-04-21 DIAGNOSIS — M79609 Pain in unspecified limb: Secondary | ICD-10-CM | POA: Diagnosis not present

## 2013-04-21 DIAGNOSIS — E1142 Type 2 diabetes mellitus with diabetic polyneuropathy: Secondary | ICD-10-CM

## 2013-04-21 DIAGNOSIS — B351 Tinea unguium: Secondary | ICD-10-CM

## 2013-04-21 DIAGNOSIS — E1149 Type 2 diabetes mellitus with other diabetic neurological complication: Secondary | ICD-10-CM

## 2013-04-21 MED ORDER — TAVABOROLE 5 % EX SOLN: CUTANEOUS | Status: DC

## 2013-04-21 NOTE — Patient Instructions (Signed)
Diabetes and Foot Care Diabetes may cause you to have problems because of poor blood supply (circulation) to your feet and legs. This may cause the skin on your feet to become thinner, break easier, and heal more slowly. Your skin may become dry, and the skin may peel and crack. You may also have nerve damage in your legs and feet causing decreased feeling in them. You may not notice minor injuries to your feet that could lead to infections or more serious problems. Taking care of your feet is one of the most important things you can do for yourself.  HOME CARE INSTRUCTIONS  Wear shoes at all times, even in the house. Do not go barefoot. Bare feet are easily injured.  Check your feet daily for blisters, cuts, and redness. If you cannot see the bottom of your feet, use a mirror or ask someone for help.  Wash your feet with warm water (do not use hot water) and mild soap. Then pat your feet and the areas between your toes until they are completely dry. Do not soak your feet as this can dry your skin.  Apply a moisturizing lotion or petroleum jelly (that does not contain alcohol and is unscented) to the skin on your feet and to dry, brittle toenails. Do not apply lotion between your toes.  Trim your toenails straight across. Do not dig under them or around the cuticle. File the edges of your nails with an emery board or nail file.  Do not cut corns or calluses or try to remove them with medicine.  Wear clean socks or stockings every day. Make sure they are not too tight. Do not wear knee-high stockings since they may decrease blood flow to your legs.  Wear shoes that fit properly and have enough cushioning. To break in new shoes, wear them for just a few hours a day. This prevents you from injuring your feet. Always look in your shoes before you put them on to be sure there are no objects inside.  Do not cross your legs. This may decrease the blood flow to your feet.  If you find a minor scrape,  cut, or break in the skin on your feet, keep it and the skin around it clean and dry. These areas may be cleansed with mild soap and water. Do not cleanse the area with peroxide, alcohol, or iodine.  When you remove an adhesive bandage, be sure not to damage the skin around it.  If you have a wound, look at it several times a day to make sure it is healing.  Do not use heating pads or hot water bottles. They may burn your skin. If you have lost feeling in your feet or legs, you may not know it is happening until it is too late.  Make sure your health care provider performs a complete foot exam at least annually or more often if you have foot problems. Report any cuts, sores, or bruises to your health care provider immediately. SEEK MEDICAL CARE IF:   You have an injury that is not healing.  You have cuts or breaks in the skin.  You have an ingrown nail.  You notice redness on your legs or feet.  You feel burning or tingling in your legs or feet.  You have pain or cramps in your legs and feet.  Your legs or feet are numb.  Your feet always feel cold. SEEK IMMEDIATE MEDICAL CARE IF:   There is increasing redness,   swelling, or pain in or around a wound.  There is a red line that goes up your leg.  Pus is coming from a wound.  You develop a fever or as directed by your health care provider.  You notice a bad smell coming from an ulcer or wound. Document Released: 03/02/2000 Document Revised: 11/05/2012 Document Reviewed: 08/12/2012 [facility] Patient Information 2014 [place].    Onychomycosis/Fungal Toenails  WHAT IS IT? An infection that lies within the keratin of your nail plate that is caused by a fungus.  WHY ME? Fungal infections affect all ages, sexes, races, and creeds.  There may be many factors that predispose you to a fungal infection such as age, coexisting medical conditions such as diabetes, or an autoimmune disease; stress, medications, fatigue, genetics,  etc.  Bottom line: fungus thrives in a warm, moist environment and your shoes offer such a location.  IS IT CONTAGIOUS? Theoretically, yes.  You do not want to share shoes, nail clippers or files with someone who has fungal toenails.  Walking around barefoot in the same room or sleeping in the same bed is unlikely to transfer the organism.  It is important to realize, however, that fungus can spread easily from one nail to the next on the same foot.  HOW DO WE TREAT THIS?  There are several ways to treat this condition.  Treatment may depend on many factors such as age, medications, pregnancy, liver and kidney conditions, etc.  It is best to ask your doctor which options are available to you.  1. No treatment.   Unlike many other medical concerns, you can live with this condition.  However for many people this can be a painful condition and may lead to ingrown toenails or a bacterial infection.  It is recommended that you keep the nails cut short to help reduce the amount of fungal nail. 2. Topical treatment.  These range from herbal remedies to prescription strength nail lacquers.  About 40-50% effective, topicals require twice daily application for approximately 9 to 12 months or until an entirely new nail has grown out.  The most effective topicals are medical grade medications available through physicians offices. 3. Oral antifungal medications.  With an 80-90% cure rate, the most common oral medication requires 3 to 4 months of therapy and stays in your system for a year as the new nail grows out.  Oral antifungal medications do require blood work to make sure it is a safe drug for you.  A liver function panel will be performed prior to starting the medication and after the first month of treatment.  It is important to have the blood work performed to avoid any harmful side effects.  In general, this medication safe but blood work is required. 4. Laser Therapy.  This treatment is performed by applying  a specialized laser to the affected nail plate.  This therapy is noninvasive, fast, and non-painful.  It is not covered by insurance and is therefore, out of pocket.  The results have been very good with a 80-95% cure rate.  [facility] is the only practice in the area to offer this therapy. 5. Permanent Nail Avulsion.  Removing the entire nail so that a new nail will not grow back.  Applied Kerydin topical nail antifungal as instructed once daily to each affected nails for 12 months.

## 2013-04-21 NOTE — Progress Notes (Signed)
   Subjective:    Patient ID: [name], male    DOB: 1928/07/23, 80 y.o.   MRN: [id number]  HPI The doctor sent me over here and I am a diabetic and been a diabetic for about 20 years or more    Review of Systems  Respiratory: Positive for shortness of breath.   Endocrine: Positive for cold intolerance.  Hematological: Bruises/bleeds easily.  All other systems reviewed and are negative.       Objective:   Physical Exam Lower extremity objective findings as follows vascular status dorsalis pedis pulse 2 for PT plus one over 4 bilateral Refill timed 3-4 seconds all digits skin temperature warm turgor diminished no edema rubor pallor mild varicosities noted. Neurologically epicritic and proprioceptive sensations are significantly diminished both on Semmes Weinstein testing to the plantar and dorsal foot digits and plantar arch area. Also vibratory sensation absent on the forefoot and rear foot. DTRs not elicited dermatologically skin color pigment normal hair growth absent nails somewhat criptotic orthopedic biomechanical exam rectus foot type mild digital contractures noted no signs of fracture or other osseous abnormalities noted no secondary infections no open wounds or ulcerations noted.       Assessment & Plan:  Assessment this time diabetes with peripheral neuropathy. Patient does have dry skin he been applying a gallbladder cream or lotion which is advised to continue to do so. Patient also has mycotic friable discolored painful nails hallux second third and fourth digits bilateral painful tender symptomatic mycotic nails debrided x8 also recommended topical antifungal medication the apply to affected nails at least once daily for 12 months duration. Prescription for keratin is provided to the patient followup in 3 months for continued palliative care is needed also recommended a lace up athletic or walking shoe rather than slip on type shoes  Harriet Masson DPM

## 2013-04-22 ENCOUNTER — Ambulatory Visit (INDEPENDENT_AMBULATORY_CARE_PROVIDER_SITE_OTHER): Payer: Medicare Other | Admitting: Family Medicine

## 2013-04-22 ENCOUNTER — Telehealth: Payer: Self-pay | Admitting: Internal Medicine

## 2013-04-22 ENCOUNTER — Encounter: Payer: Self-pay | Admitting: Family Medicine

## 2013-04-22 VITALS — BP 118/60 | HR 70 | Temp 99.5°F | Wt 212.0 lb

## 2013-04-22 DIAGNOSIS — J209 Acute bronchitis, unspecified: Secondary | ICD-10-CM | POA: Diagnosis not present

## 2013-04-22 MED ORDER — HYDROCODONE-HOMATROPINE 5-1.5 MG/5ML PO SYRP: 5.0000 mL | ORAL_SOLUTION | ORAL | Status: DC | PRN

## 2013-04-22 MED ORDER — AMOXICILLIN-POT CLAVULANATE 875-125 MG PO TABS: 1.0000 | ORAL_TABLET | Freq: Two times a day (BID) | ORAL | Status: DC

## 2013-04-22 NOTE — Progress Notes (Signed)
Pre visit review using our clinic review tool, if applicable. No additional management support is needed unless otherwise documented below in the visit note. 

## 2013-04-22 NOTE — Progress Notes (Signed)
   Subjective:    Patient ID: [name], male    DOB: Dec 19, 1928, 80 y.o.   MRN: [id number]  HPI Here for 2 days of fevers, aches, PND, ST, chest congestion and coughing up green sputum. Drinking fluids and taking Tylenol.    Review of Systems  Constitutional: Positive for fever.  HENT: Positive for congestion and postnasal drip.   Eyes: Negative.   Respiratory: Positive for cough and wheezing. Negative for shortness of breath.   Cardiovascular: Negative.        Objective:   Physical Exam  Constitutional: He appears well-developed and well-nourished.  HENT:  Right Ear: External ear normal.  Left Ear: External ear normal.  Nose: Nose normal.  Mouth/Throat: Oropharynx is clear and moist.  Eyes: Conjunctivae are normal.  Pulmonary/Chest: Effort normal. No respiratory distress. He has no rales.  Scattered rhonchi and wheezes  Lymphadenopathy:    He has no cervical adenopathy.          Assessment & Plan:  Bronchitis probably secondary to influenza.

## 2013-04-22 NOTE — Telephone Encounter (Signed)
Patient Information:  Caller Name: Brycen  Phone: [phone]  Patient: [name]  Gender: Male  DOB: [birth year]  Age: 80 Years  PCP: Shanon Ace [facility])  Office Follow Up:  Does the office need to follow up with this patient?: No  Instructions For The Office: N/A   Symptoms  Reason For Call & Symptoms: 04/21/13 cough, joints ache.  04/22/13 cough continues, joints ache, mild headache, congestion, afebrile.  Eating/drinking well.   Taking Tylenol and Vit C.  Triage nurse able to hear mild wheezing on phone, no hx of lung disease.  Pt denies any difficulty breathing.  Reviewed Health History In EMR: Yes  Reviewed Medications In EMR: Yes  Reviewed Allergies In EMR: Yes  Reviewed Surgeries / Procedures: Yes  Date of Onset of Symptoms: 04/21/2013  Guideline(s) Used:  Cough  Disposition Per Guideline:   Go to Office Now  Reason For Disposition Reached:   Wheezing is present  Advice Given:  Call Back If:  Difficulty breathing  You become worse.  Patient Will Follow Care Advice:  YES  Appointment Scheduled:  04/22/2013 16:15:00 Appointment Scheduled Provider:  Alysia Penna [facility])

## 2013-04-22 NOTE — Telephone Encounter (Signed)
FYI! Pt has an appt with SF today.

## 2013-04-24 ENCOUNTER — Encounter: Payer: Medicare Other | Admitting: Internal Medicine

## 2013-04-28 ENCOUNTER — Encounter: Payer: Self-pay | Admitting: Cardiology

## 2013-04-29 ENCOUNTER — Ambulatory Visit (HOSPITAL_COMMUNITY): Payer: Medicare Other | Attending: Internal Medicine | Admitting: Radiology

## 2013-04-29 VITALS — BP 141/74 | HR 57 | Ht 74.0 in | Wt 210.0 lb

## 2013-04-29 DIAGNOSIS — Z01810 Encounter for preprocedural cardiovascular examination: Secondary | ICD-10-CM | POA: Insufficient documentation

## 2013-04-29 DIAGNOSIS — I509 Heart failure, unspecified: Secondary | ICD-10-CM | POA: Insufficient documentation

## 2013-04-29 DIAGNOSIS — Z951 Presence of aortocoronary bypass graft: Secondary | ICD-10-CM | POA: Diagnosis not present

## 2013-04-29 DIAGNOSIS — I779 Disorder of arteries and arterioles, unspecified: Secondary | ICD-10-CM | POA: Insufficient documentation

## 2013-04-29 DIAGNOSIS — I2510 Atherosclerotic heart disease of native coronary artery without angina pectoris: Secondary | ICD-10-CM | POA: Insufficient documentation

## 2013-04-29 DIAGNOSIS — Z794 Long term (current) use of insulin: Secondary | ICD-10-CM | POA: Insufficient documentation

## 2013-04-29 DIAGNOSIS — R0602 Shortness of breath: Secondary | ICD-10-CM | POA: Diagnosis not present

## 2013-04-29 DIAGNOSIS — E119 Type 2 diabetes mellitus without complications: Secondary | ICD-10-CM | POA: Insufficient documentation

## 2013-04-29 DIAGNOSIS — I447 Left bundle-branch block, unspecified: Secondary | ICD-10-CM | POA: Insufficient documentation

## 2013-04-29 DIAGNOSIS — I10 Essential (primary) hypertension: Secondary | ICD-10-CM | POA: Diagnosis not present

## 2013-04-29 DIAGNOSIS — Z87891 Personal history of nicotine dependence: Secondary | ICD-10-CM | POA: Insufficient documentation

## 2013-04-29 DIAGNOSIS — I255 Ischemic cardiomyopathy: Secondary | ICD-10-CM

## 2013-04-29 DIAGNOSIS — R0609 Other forms of dyspnea: Secondary | ICD-10-CM | POA: Insufficient documentation

## 2013-04-29 DIAGNOSIS — E785 Hyperlipidemia, unspecified: Secondary | ICD-10-CM | POA: Diagnosis not present

## 2013-04-29 DIAGNOSIS — R0989 Other specified symptoms and signs involving the circulatory and respiratory systems: Secondary | ICD-10-CM | POA: Insufficient documentation

## 2013-04-29 DIAGNOSIS — I252 Old myocardial infarction: Secondary | ICD-10-CM | POA: Insufficient documentation

## 2013-04-29 MED ORDER — TECHNETIUM TC 99M SESTAMIBI GENERIC - CARDIOLITE
10.8000 | Freq: Once | INTRAVENOUS | Status: AC | PRN
  Administered 2013-04-29: 11 via INTRAVENOUS

## 2013-04-29 MED ORDER — REGADENOSON 0.4 MG/5ML IV SOLN
0.4000 mg | Freq: Once | INTRAVENOUS | Status: AC
  Administered 2013-04-29: 0.4 mg via INTRAVENOUS

## 2013-04-29 MED ORDER — TECHNETIUM TC 99M SESTAMIBI GENERIC - CARDIOLITE
33.0000 | Freq: Once | INTRAVENOUS | Status: AC | PRN
  Administered 2013-04-29: 33 via INTRAVENOUS

## 2013-04-29 NOTE — Progress Notes (Signed)
[facility] [address] [place], [place] [address] [phone]    Cardiology Nuclear Med Study  [name] is a 80 y.o. male     MRN : [id number]     DOB: October 13, 1928  Procedure Date: 04/29/2013  Nuclear Med Background Indication for Stress Test:  Evaluation for Ischemia, Surgical Clearance- AICD Generator replacement, and Graft Patency History:  CAD, MI 2005, CABG 2005, AICD, CHF, Echo 2011 EF 20-25% MPI 2011 (scar) EF 28% Cardiac Risk Factors: Carotid Disease, History of Smoking, Hypertension, IDDM Type 2, LBBB and Lipids  Symptoms:  DOE and SOB   Nuclear Pre-Procedure Caffeine/Decaff Intake:  None NPO After: 9:00pm   Lungs:  clear O2 Sat: 96% on room air. IV 0.9% NS with Angio Cath:  22g  IV Site: R Antecubital  IV Started by:  Crissie Figures, RN  Chest Size (in):  46 Cup Size: n/a  Height: 6\' 2"  (1.88 m)  Weight:  210 lb (95.255 kg)  BMI:  Body mass index is 26.95 kg/(m^2). Tech Comments:  CBG @ 8 am =240    Nuclear Med Study 1 or 2 day study: 1 day  Stress Test Type:  Adenosine  Reading MD: N/A  Order Authorizing Provider:  Jolyn Nap, MD  Resting Radionuclide: Technetium 19m Sestamibi  Resting Radionuclide Dose: 11.0 mCi   Stress Radionuclide:  Technetium 45m Sestamibi  Stress Radionuclide Dose: 33.0 mCi           Stress Protocol Rest HR: 57 Stress HR: 65  Rest BP: 141/74 Stress BP: 147/65  Exercise Time (min): n/a METS: n/a           Dose of Adenosine (mg):  53.5 mg Dose of Lexiscan: n/a mg  Dose of Atropine (mg): n/a Dose of Dobutamine: n/a mcg/kg/min (at max HR)  Stress Test Technologist: Glade Lloyd, BS-ES  Nuclear Technologist:  Annye Rusk, CNMT     Rest Procedure:  Myocardial perfusion imaging was performed at rest 45 minutes following the intravenous administration of Technetium 35m Sestamibi. Rest ECG: Normal sinus rhythm. Interventricular conduction delay of the left bundle type  Stress Procedure:  The  patient received IV adenosine at 140 mcg/kg/min for 4 minutes.  Technetium 17m Sestamibi was injected at the 2 minute mark and quantitative spect images were obtained after a 45 minute delay.  During the infusion of Adenosine, the patient complained of chest tightness.  This completely resolved in recovery.  Stress ECG: No significant change from baseline ECG  QPS Raw Data Images:  Normal; no motion artifact; normal heart/lung ratio. Stress Images:  There is a very large area of severe decreased uptake affecting all segments other than the base inferoseptal, base anteroseptal, base anterior, and base lateral segments.   Rest images :  The rest images appear exactly the same as the stress images.  Subtraction (SDS):  No evidence of ischemia. Transient Ischemic Dilatation (Normal <1.22):   1.05 Lung/Heart Ratio (Normal <0.45):  0.32  Quantitative Gated Spect Images QGS EDV:  416 ml QGS ESV:  335 ml  Impression Exercise Capacity:  Adenosine study with no exercise. BP Response:  Normal blood pressure response. Clinical Symptoms:  Chest tightness ECG Impression:  No significant ST segment change suggestive of ischemia. Comparison with Prior Nuclear Study: Study is compared to the study report of September, 2011 and the hard copy images from 2011.  Overall Impression:  This study is consistent with an old very large infarct affecting all areas of  the myocardium other than the base segments of the inferior septum, anterior septum, anterior wall, and lateral wall. I have reviewed these images and compared them to the hard copy images from the study of 2011. There is no significant change. There is no significant ischemia.   LV Ejection Fraction: 19%.  LV Wall Motion:    There is severe left ventricular dysfunction. There is motion only at the base of the septum anterior wall and lateral walls.   Dola Argyle, MD

## 2013-05-04 ENCOUNTER — Encounter: Payer: Self-pay | Admitting: Internal Medicine

## 2013-05-12 ENCOUNTER — Other Ambulatory Visit: Payer: Self-pay | Admitting: *Deleted

## 2013-05-12 ENCOUNTER — Encounter (HOSPITAL_COMMUNITY): Payer: Self-pay | Admitting: Pharmacy Technician

## 2013-05-12 ENCOUNTER — Encounter: Payer: Self-pay | Admitting: *Deleted

## 2013-05-12 ENCOUNTER — Telehealth: Payer: Self-pay | Admitting: *Deleted

## 2013-05-12 DIAGNOSIS — Z01812 Encounter for preprocedural laboratory examination: Secondary | ICD-10-CM

## 2013-05-12 DIAGNOSIS — I255 Ischemic cardiomyopathy: Secondary | ICD-10-CM

## 2013-05-12 NOTE — Telephone Encounter (Signed)
Called patient to schedule ICD gen change and CRT upgrade - scheduled for 05/20/13 at 8:30 am.    Pt to be at hospital at 6:30am. Pre procedure labs to be done 3/2.  Letter of instructions reviewed with pt and wife and left at front desk for pick up. Wound check scheduled for 3/16. Patient verbalized understanding and agreeable to plan.

## 2013-05-16 ENCOUNTER — Other Ambulatory Visit: Payer: Self-pay | Admitting: Internal Medicine

## 2013-05-17 DIAGNOSIS — Z95810 Presence of automatic (implantable) cardiac defibrillator: Secondary | ICD-10-CM

## 2013-05-17 HISTORY — DX: Presence of automatic (implantable) cardiac defibrillator: Z95.810

## 2013-05-18 ENCOUNTER — Other Ambulatory Visit (INDEPENDENT_AMBULATORY_CARE_PROVIDER_SITE_OTHER): Payer: Medicare Other

## 2013-05-18 ENCOUNTER — Telehealth: Payer: Self-pay | Admitting: *Deleted

## 2013-05-18 DIAGNOSIS — Z01812 Encounter for preprocedural laboratory examination: Secondary | ICD-10-CM

## 2013-05-18 DIAGNOSIS — I2589 Other forms of chronic ischemic heart disease: Secondary | ICD-10-CM

## 2013-05-18 DIAGNOSIS — I255 Ischemic cardiomyopathy: Secondary | ICD-10-CM

## 2013-05-18 LAB — CBC WITH DIFFERENTIAL/PLATELET
BASOS PCT: 0.3 % (ref 0.0–3.0)
Basophils Absolute: 0 10*3/uL (ref 0.0–0.1)
EOS ABS: 0.2 10*3/uL (ref 0.0–0.7)
Eosinophils Relative: 1.8 % (ref 0.0–5.0)
HEMATOCRIT: 43.1 % (ref 39.0–52.0)
HEMOGLOBIN: 13.9 g/dL (ref 13.0–17.0)
LYMPHS ABS: 3.2 10*3/uL (ref 0.7–4.0)
LYMPHS PCT: 28.3 % (ref 12.0–46.0)
MCHC: 32.3 g/dL (ref 30.0–36.0)
MCV: 96.2 fl (ref 78.0–100.0)
Monocytes Absolute: 1.1 10*3/uL — ABNORMAL HIGH (ref 0.1–1.0)
Monocytes Relative: 9.7 % (ref 3.0–12.0)
Neutro Abs: 6.8 10*3/uL (ref 1.4–7.7)
Neutrophils Relative %: 59.9 % (ref 43.0–77.0)
Platelets: 164 10*3/uL (ref 150.0–400.0)
RBC: 4.49 Mil/uL (ref 4.22–5.81)
RDW: 13.6 % (ref 11.5–14.6)
WBC: 11.3 10*3/uL — ABNORMAL HIGH (ref 4.5–10.5)

## 2013-05-18 LAB — BASIC METABOLIC PANEL
BUN: 27 mg/dL — ABNORMAL HIGH (ref 6–23)
CALCIUM: 9.6 mg/dL (ref 8.4–10.5)
CO2: 31 meq/L (ref 19–32)
Chloride: 103 mEq/L (ref 96–112)
Creatinine, Ser: 1.4 mg/dL (ref 0.4–1.5)
GFR: 53.65 mL/min — ABNORMAL LOW (ref >60.00)
Glucose, Bld: 45 mg/dL — CL (ref 70–99)
POTASSIUM: 3.9 meq/L (ref 3.5–5.1)
SODIUM: 141 meq/L (ref 135–145)

## 2013-05-18 NOTE — Telephone Encounter (Signed)
Please have him check BG at least tice a day for the next 2 weeks  Send in readings and how confirm much insulintaking?20 units     IF bg are below 90  Then decrease insulin to 18 units from 20

## 2013-05-18 NOTE — Telephone Encounter (Signed)
Received call from lab for critical glucose of 45 today.   Called patient who reports feeling fine.  His wife states they got a new meter recently and since then his readings have been "on the low side" .  She tells him he should cut back on his dose of insulin but he has not.  I educated them on s/s of hypoglycemia and told them I would forward the results to his PCP.  Pt and his spouse verbalize understanding.

## 2013-05-19 DIAGNOSIS — I679 Cerebrovascular disease, unspecified: Secondary | ICD-10-CM | POA: Diagnosis not present

## 2013-05-19 DIAGNOSIS — I2589 Other forms of chronic ischemic heart disease: Secondary | ICD-10-CM | POA: Diagnosis not present

## 2013-05-19 DIAGNOSIS — Z951 Presence of aortocoronary bypass graft: Secondary | ICD-10-CM | POA: Diagnosis not present

## 2013-05-19 DIAGNOSIS — E039 Hypothyroidism, unspecified: Secondary | ICD-10-CM | POA: Diagnosis not present

## 2013-05-19 DIAGNOSIS — E785 Hyperlipidemia, unspecified: Secondary | ICD-10-CM | POA: Diagnosis not present

## 2013-05-19 DIAGNOSIS — D591 Autoimmune hemolytic anemia, unspecified: Secondary | ICD-10-CM | POA: Diagnosis not present

## 2013-05-19 DIAGNOSIS — E559 Vitamin D deficiency, unspecified: Secondary | ICD-10-CM | POA: Diagnosis not present

## 2013-05-19 DIAGNOSIS — I10 Essential (primary) hypertension: Secondary | ICD-10-CM | POA: Diagnosis not present

## 2013-05-19 DIAGNOSIS — I2510 Atherosclerotic heart disease of native coronary artery without angina pectoris: Secondary | ICD-10-CM | POA: Diagnosis not present

## 2013-05-19 DIAGNOSIS — Z8673 Personal history of transient ischemic attack (TIA), and cerebral infarction without residual deficits: Secondary | ICD-10-CM | POA: Diagnosis not present

## 2013-05-19 DIAGNOSIS — G4733 Obstructive sleep apnea (adult) (pediatric): Secondary | ICD-10-CM | POA: Diagnosis not present

## 2013-05-19 DIAGNOSIS — E119 Type 2 diabetes mellitus without complications: Secondary | ICD-10-CM | POA: Diagnosis not present

## 2013-05-19 DIAGNOSIS — G2581 Restless legs syndrome: Secondary | ICD-10-CM | POA: Diagnosis not present

## 2013-05-19 DIAGNOSIS — Z4502 Encounter for adjustment and management of automatic implantable cardiac defibrillator: Secondary | ICD-10-CM | POA: Diagnosis not present

## 2013-05-19 DIAGNOSIS — I447 Left bundle-branch block, unspecified: Secondary | ICD-10-CM | POA: Diagnosis not present

## 2013-05-19 DIAGNOSIS — I509 Heart failure, unspecified: Secondary | ICD-10-CM | POA: Diagnosis not present

## 2013-05-19 DIAGNOSIS — R599 Enlarged lymph nodes, unspecified: Secondary | ICD-10-CM | POA: Diagnosis not present

## 2013-05-19 DIAGNOSIS — I5022 Chronic systolic (congestive) heart failure: Secondary | ICD-10-CM | POA: Diagnosis not present

## 2013-05-19 MED ORDER — SODIUM CHLORIDE 0.9 % IR SOLN
80.0000 mg | Status: DC
  Filled 2013-05-19: qty 2

## 2013-05-19 MED ORDER — CEFAZOLIN SODIUM-DEXTROSE 2-3 GM-% IV SOLR
2.0000 g | INTRAVENOUS | Status: DC
  Filled 2013-05-19: qty 50

## 2013-05-19 NOTE — Telephone Encounter (Signed)
Spoke to the pt and his wife.  He will keep a record of his blood sugar for 2wks.  Will not be able to begin for a few days due to hospital procedure.  He is taking 20 units of insulin.  Directed him to take bg and if below 90 than to decrease the insulin to 18 units.  Noted in the system.

## 2013-05-20 ENCOUNTER — Encounter (HOSPITAL_COMMUNITY): Payer: Self-pay | Admitting: General Practice

## 2013-05-20 ENCOUNTER — Telehealth: Payer: Self-pay | Admitting: Family Medicine

## 2013-05-20 ENCOUNTER — Ambulatory Visit (HOSPITAL_COMMUNITY): Payer: Medicare Other

## 2013-05-20 ENCOUNTER — Encounter (HOSPITAL_COMMUNITY)
Admission: RE | Disposition: A | Discharge status: Home / Self Care | Payer: Self-pay | Source: Ambulatory Visit | Attending: Internal Medicine

## 2013-05-20 ENCOUNTER — Ambulatory Visit (HOSPITAL_COMMUNITY)
Admission: RE | Admit: 2013-05-20 | Discharge: 2013-05-21 | Disposition: A | Discharge status: Home / Self Care | Payer: Medicare Other | Source: Ambulatory Visit | Attending: Internal Medicine | Admitting: Internal Medicine

## 2013-05-20 DIAGNOSIS — G2581 Restless legs syndrome: Secondary | ICD-10-CM

## 2013-05-20 DIAGNOSIS — Z8673 Personal history of transient ischemic attack (TIA), and cerebral infarction without residual deficits: Secondary | ICD-10-CM | POA: Insufficient documentation

## 2013-05-20 DIAGNOSIS — E1140 Type 2 diabetes mellitus with diabetic neuropathy, unspecified: Secondary | ICD-10-CM

## 2013-05-20 DIAGNOSIS — D591 Autoimmune hemolytic anemia, unspecified: Secondary | ICD-10-CM

## 2013-05-20 DIAGNOSIS — I2589 Other forms of chronic ischemic heart disease: Secondary | ICD-10-CM | POA: Diagnosis not present

## 2013-05-20 DIAGNOSIS — R258 Other abnormal involuntary movements: Secondary | ICD-10-CM

## 2013-05-20 DIAGNOSIS — L84 Corns and callosities: Secondary | ICD-10-CM

## 2013-05-20 DIAGNOSIS — M1610 Unilateral primary osteoarthritis, unspecified hip: Secondary | ICD-10-CM

## 2013-05-20 DIAGNOSIS — R918 Other nonspecific abnormal finding of lung field: Secondary | ICD-10-CM | POA: Diagnosis not present

## 2013-05-20 DIAGNOSIS — I509 Heart failure, unspecified: Secondary | ICD-10-CM | POA: Insufficient documentation

## 2013-05-20 DIAGNOSIS — R413 Other amnesia: Secondary | ICD-10-CM

## 2013-05-20 DIAGNOSIS — I10 Essential (primary) hypertension: Secondary | ICD-10-CM

## 2013-05-20 DIAGNOSIS — E119 Type 2 diabetes mellitus without complications: Secondary | ICD-10-CM | POA: Insufficient documentation

## 2013-05-20 DIAGNOSIS — Z789 Other specified health status: Secondary | ICD-10-CM

## 2013-05-20 DIAGNOSIS — N289 Disorder of kidney and ureter, unspecified: Secondary | ICD-10-CM

## 2013-05-20 DIAGNOSIS — I2510 Atherosclerotic heart disease of native coronary artery without angina pectoris: Secondary | ICD-10-CM

## 2013-05-20 DIAGNOSIS — I255 Ischemic cardiomyopathy: Secondary | ICD-10-CM

## 2013-05-20 DIAGNOSIS — M533 Sacrococcygeal disorders, not elsewhere classified: Secondary | ICD-10-CM

## 2013-05-20 DIAGNOSIS — E785 Hyperlipidemia, unspecified: Secondary | ICD-10-CM

## 2013-05-20 DIAGNOSIS — G4733 Obstructive sleep apnea (adult) (pediatric): Secondary | ICD-10-CM

## 2013-05-20 DIAGNOSIS — E039 Hypothyroidism, unspecified: Secondary | ICD-10-CM

## 2013-05-20 DIAGNOSIS — R351 Nocturia: Secondary | ICD-10-CM

## 2013-05-20 DIAGNOSIS — G629 Polyneuropathy, unspecified: Secondary | ICD-10-CM

## 2013-05-20 DIAGNOSIS — IMO0001 Reserved for inherently not codable concepts without codable children: Secondary | ICD-10-CM

## 2013-05-20 DIAGNOSIS — I5022 Chronic systolic (congestive) heart failure: Secondary | ICD-10-CM

## 2013-05-20 DIAGNOSIS — Z951 Presence of aortocoronary bypass graft: Secondary | ICD-10-CM | POA: Insufficient documentation

## 2013-05-20 DIAGNOSIS — E559 Vitamin D deficiency, unspecified: Secondary | ICD-10-CM | POA: Insufficient documentation

## 2013-05-20 DIAGNOSIS — T50995A Adverse effect of other drugs, medicaments and biological substances, initial encounter: Secondary | ICD-10-CM

## 2013-05-20 DIAGNOSIS — I447 Left bundle-branch block, unspecified: Secondary | ICD-10-CM | POA: Diagnosis not present

## 2013-05-20 DIAGNOSIS — D509 Iron deficiency anemia, unspecified: Secondary | ICD-10-CM

## 2013-05-20 DIAGNOSIS — G479 Sleep disorder, unspecified: Secondary | ICD-10-CM

## 2013-05-20 DIAGNOSIS — M79606 Pain in leg, unspecified: Secondary | ICD-10-CM

## 2013-05-20 DIAGNOSIS — R599 Enlarged lymph nodes, unspecified: Secondary | ICD-10-CM

## 2013-05-20 DIAGNOSIS — H9190 Unspecified hearing loss, unspecified ear: Secondary | ICD-10-CM

## 2013-05-20 DIAGNOSIS — R5383 Other fatigue: Secondary | ICD-10-CM

## 2013-05-20 DIAGNOSIS — Z4502 Encounter for adjustment and management of automatic implantable cardiac defibrillator: Secondary | ICD-10-CM | POA: Diagnosis not present

## 2013-05-20 DIAGNOSIS — I679 Cerebrovascular disease, unspecified: Secondary | ICD-10-CM

## 2013-05-20 DIAGNOSIS — R634 Abnormal weight loss: Secondary | ICD-10-CM

## 2013-05-20 HISTORY — PX: BI-VENTRICULAR IMPLANTABLE CARDIOVERTER DEFIBRILLATOR UPGRADE: SHX5461

## 2013-05-20 HISTORY — DX: Obstructive sleep apnea (adult) (pediatric): G47.33

## 2013-05-20 HISTORY — PX: BI-VENTRICULAR IMPLANTABLE CARDIOVERTER DEFIBRILLATOR  (CRT-D): SHX5747

## 2013-05-20 HISTORY — DX: Heart failure, unspecified: I50.9

## 2013-05-20 HISTORY — DX: Depression, unspecified: F32.A

## 2013-05-20 HISTORY — DX: Dependence on other enabling machines and devices: Z99.89

## 2013-05-20 HISTORY — DX: Acute myocardial infarction, unspecified: I21.9

## 2013-05-20 HISTORY — DX: Unspecified malignant neoplasm of skin, unspecified: C44.90

## 2013-05-20 HISTORY — DX: Major depressive disorder, single episode, unspecified: F32.9

## 2013-05-20 HISTORY — DX: Unspecified osteoarthritis, unspecified site: M19.90

## 2013-05-20 HISTORY — DX: Unspecified malignant neoplasm of skin of unspecified part of face: C44.300

## 2013-05-20 HISTORY — PX: IMPLANTABLE CARDIOVERTER DEFIBRILLATOR (ICD) GENERATOR CHANGE: SHX5469

## 2013-05-20 LAB — GLUCOSE, CAPILLARY
GLUCOSE-CAPILLARY: 111 mg/dL — AB (ref 70–99)
Glucose-Capillary: 87 mg/dL (ref 70–99)
Glucose-Capillary: 138 mg/dL — ABNORMAL HIGH (ref 70–99)
Glucose-Capillary: 114 mg/dL — ABNORMAL HIGH (ref 70–99)

## 2013-05-20 LAB — SURGICAL PCR SCREEN
MRSA, PCR: NEGATIVE
Staphylococcus aureus: NEGATIVE

## 2013-05-20 SURGERY — ICD GENERATOR CHANGE
Anesthesia: LOCAL

## 2013-05-20 MED ORDER — SILODOSIN 8 MG PO CAPS: 8.0000 mg | ORAL_CAPSULE | Freq: Every day | ORAL | Status: DC

## 2013-05-20 MED ORDER — ESCITALOPRAM OXALATE 10 MG PO TABS
10.0000 mg | ORAL_TABLET | Freq: Every day | ORAL | Status: DC
  Administered 2013-05-20 – 2013-05-21 (×2): 10 mg via ORAL
  Filled 2013-05-20 (×2): qty 1

## 2013-05-20 MED ORDER — SPIRONOLACTONE 12.5 MG HALF TABLET
12.5000 mg | ORAL_TABLET | Freq: Every day | ORAL | Status: DC
  Administered 2013-05-20: 12.5 mg via ORAL
  Filled 2013-05-20 (×2): qty 1

## 2013-05-20 MED ORDER — METFORMIN HCL 1000 MG PO TABS: 1000.0000 mg | ORAL_TABLET | Freq: Two times a day (BID) | ORAL | Status: DC

## 2013-05-20 MED ORDER — ESCITALOPRAM OXALATE 10 MG PO TABS: 10.0000 mg | ORAL_TABLET | Freq: Every day | ORAL | Status: DC

## 2013-05-20 MED ORDER — ASPIRIN EC 81 MG PO TBEC
81.0000 mg | DELAYED_RELEASE_TABLET | Freq: Every day | ORAL | Status: DC
  Administered 2013-05-20 – 2013-05-21 (×2): 81 mg via ORAL
  Filled 2013-05-20 (×2): qty 1

## 2013-05-20 MED ORDER — CARVEDILOL 12.5 MG PO TABS
12.5000 mg | ORAL_TABLET | Freq: Two times a day (BID) | ORAL | Status: DC
  Administered 2013-05-20 – 2013-05-21 (×2): 12.5 mg via ORAL
  Filled 2013-05-20 (×4): qty 1

## 2013-05-20 MED ORDER — ONDANSETRON HCL 4 MG/2ML IJ SOLN: 4.0000 mg | Freq: Four times a day (QID) | INTRAMUSCULAR | Status: DC | PRN

## 2013-05-20 MED ORDER — METFORMIN HCL 500 MG PO TABS
1000.0000 mg | ORAL_TABLET | Freq: Two times a day (BID) | ORAL | Status: DC
  Administered 2013-05-20: 1000 mg via ORAL
  Filled 2013-05-20 (×4): qty 2

## 2013-05-20 MED ORDER — SODIUM CHLORIDE 0.9 % IV SOLN
INTRAVENOUS | Status: DC
  Administered 2013-05-20: 07:00:00 via INTRAVENOUS

## 2013-05-20 MED ORDER — SAXAGLIPTIN HCL 5 MG PO TABS: 5.0000 mg | ORAL_TABLET | Freq: Every day | ORAL | Status: DC

## 2013-05-20 MED ORDER — ATORVASTATIN CALCIUM 40 MG PO TABS
40.0000 mg | ORAL_TABLET | Freq: Every day | ORAL | Status: DC
  Administered 2013-05-20: 40 mg via ORAL
  Filled 2013-05-20 (×2): qty 1

## 2013-05-20 MED ORDER — ACETAMINOPHEN 325 MG PO TABS: 325.0000 mg | ORAL_TABLET | ORAL | Status: DC | PRN

## 2013-05-20 MED ORDER — SODIUM CHLORIDE 0.9 % IV SOLN: INTRAVENOUS | Status: AC

## 2013-05-20 MED ORDER — RAMIPRIL 2.5 MG PO CAPS
2.5000 mg | ORAL_CAPSULE | Freq: Two times a day (BID) | ORAL | Status: DC
  Administered 2013-05-20 – 2013-05-21 (×3): 2.5 mg via ORAL
  Filled 2013-05-20 (×5): qty 1

## 2013-05-20 MED ORDER — FENTANYL CITRATE 0.05 MG/ML IJ SOLN
INTRAMUSCULAR | Status: AC
  Filled 2013-05-20: qty 2

## 2013-05-20 MED ORDER — MUPIROCIN 2 % EX OINT
TOPICAL_OINTMENT | CUTANEOUS | Status: AC
  Filled 2013-05-20: qty 22

## 2013-05-20 MED ORDER — CEFAZOLIN SODIUM 1-5 GM-% IV SOLN
1.0000 g | Freq: Four times a day (QID) | INTRAVENOUS | Status: AC
  Administered 2013-05-20 – 2013-05-21 (×3): 1 g via INTRAVENOUS
  Filled 2013-05-20 (×3): qty 50

## 2013-05-20 MED ORDER — CHLORHEXIDINE GLUCONATE 4 % EX LIQD
60.0000 mL | Freq: Once | CUTANEOUS | Status: DC
  Filled 2013-05-20: qty 60

## 2013-05-20 MED ORDER — GLIPIZIDE 10 MG PO TABS: 10.0000 mg | ORAL_TABLET | Freq: Two times a day (BID) | ORAL | Status: DC

## 2013-05-20 MED ORDER — TAMSULOSIN HCL 0.4 MG PO CAPS
0.4000 mg | ORAL_CAPSULE | Freq: Every day | ORAL | Status: DC
Start: 2013-05-21 — End: 2013-05-21
  Administered 2013-05-21: 0.4 mg via ORAL
  Filled 2013-05-20 (×2): qty 1

## 2013-05-20 MED ORDER — GLIPIZIDE 10 MG PO TABS
10.0000 mg | ORAL_TABLET | Freq: Two times a day (BID) | ORAL | Status: DC
  Administered 2013-05-20 – 2013-05-21 (×2): 10 mg via ORAL
  Filled 2013-05-20 (×4): qty 1

## 2013-05-20 MED ORDER — AMOXICILLIN-POT CLAVULANATE 875-125 MG PO TABS
1.0000 | ORAL_TABLET | Freq: Two times a day (BID) | ORAL | Status: DC
  Administered 2013-05-20 – 2013-05-21 (×2): 1 via ORAL
  Filled 2013-05-20 (×4): qty 1

## 2013-05-20 MED ORDER — CLONAZEPAM 1 MG PO TABS
1.0000 mg | ORAL_TABLET | Freq: Every day | ORAL | Status: DC
  Administered 2013-05-20: 1 mg via ORAL
  Filled 2013-05-20: qty 1

## 2013-05-20 MED ORDER — IPRATROPIUM BROMIDE 0.03 % NA SOLN: 2.0000 | Freq: Two times a day (BID) | NASAL | Status: DC

## 2013-05-20 MED ORDER — LINAGLIPTIN 5 MG PO TABS
5.0000 mg | ORAL_TABLET | Freq: Every day | ORAL | Status: DC
Start: 2013-05-21 — End: 2013-05-21
  Administered 2013-05-21: 5 mg via ORAL
  Filled 2013-05-20: qty 1

## 2013-05-20 MED ORDER — FUROSEMIDE 20 MG PO TABS
20.0000 mg | ORAL_TABLET | Freq: Every day | ORAL | Status: DC
  Filled 2013-05-20 (×2): qty 1

## 2013-05-20 MED ORDER — MUPIROCIN 2 % EX OINT
TOPICAL_OINTMENT | Freq: Two times a day (BID) | CUTANEOUS | Status: DC
  Administered 2013-05-20 – 2013-05-21 (×2): via NASAL
  Filled 2013-05-20: qty 22

## 2013-05-20 MED ORDER — LIDOCAINE HCL (PF) 1 % IJ SOLN
INTRAMUSCULAR | Status: AC
  Filled 2013-05-20: qty 60

## 2013-05-20 MED ORDER — MIDAZOLAM HCL 5 MG/5ML IJ SOLN
INTRAMUSCULAR | Status: AC
  Filled 2013-05-20: qty 5

## 2013-05-20 MED ORDER — ONETOUCH LANCETS MISC: Status: DC

## 2013-05-20 NOTE — Telephone Encounter (Signed)
He is in hospital for procedure and i cannot tell when last refill was from  the med list .    However when he gets out  We can follow up.  Ok to refill x 1

## 2013-05-20 NOTE — H&P (Signed)
Patient Care Team: Burnis Medin, MD as PCP - General (Internal Medicine) Johna Sheriff, MD (Ophthalmology) Lelon Perla, MD (Cardiology) Deboraha Sprang, MD (Cardiology) Chesley Mires, MD (Pulmonary Disease) [name]   HPI  [name] is a 80 y.o. male Admitted for ICD genearator replacement andupgrade to CRT-D for ischemic heart disease prior bypass surgery and chronic systolic failure.   His last echocardiogram was performed in September 2011 when he presented with congestive heart failure. It demonstrated diffuse wall motion abnormalities and an ejection fraction of 20-25% His last Myoview in March of 2009 showed an ejection fraction of 25% with a prior anterior, apical, and inferoapical infarct, but there was no ischemia.  Myoview 2015  EF 19%    He has class 3 CHF   He told me the story last year of having been trapped by the [place] in [place] and taking 12 days of day and night fighting to breakthrough; three quarters of the man of the 155 didn't survive   Past Medical History  Diagnosis Date  . ARTHRITIS, HIP   . Autoimmune hemolytic anemias   . CEREBROVASCULAR DISEASE   . Ischemic cardiomyopathy     S/P CABG; EF 201-25% 11/2009  . ICD (implantable cardiac defibrillator) in place     [facility]--implanted 2005  . DIABETES MELLITUS, TYPE II   . Enlargement of lymph nodes   . HYPERLIPIDEMIA   . HYPERTENSION   . HYPOTHYROIDISM   . LESION, FACE   . Nocturia   . RESTLESS LEG SYNDROME   . UNSPECIFIED ANEMIA   . VITAMIN D DEFICIENCY   . WEIGHT LOSS   . OBSTRUCTIVE SLEEP APNEA 07/19/2009  . Hx of frostbite     [place] 1950 face and digits     Past Surgical History  Procedure Laterality Date  . Cataract extraction    . Cholecystectomy    . Cardiac defibrillator placement    . Ventricular resection / repair aneurysm      left   . Cabg      Current Facility-Administered Medications  Medication Dose Route Frequency Provider Last Rate Last  Dose  . 0.9 %  sodium chloride infusion   Intravenous Continuous Deboraha Sprang, MD 50 mL/hr at 05/20/13 [phone]    . ceFAZolin (ANCEF) IVPB 2 g/50 mL premix  2 g Intravenous On Call Deboraha Sprang, MD      . chlorhexidine (HIBICLENS) 4 % liquid 4 application  60 mL Topical Once Deboraha Sprang, MD      . gentamicin (GARAMYCIN) 80 mg in sodium chloride irrigation 0.9 % 500 mL irrigation  80 mg Irrigation On Call Deboraha Sprang, MD      . mupirocin ointment Drue Stager) 2 %   Nasal BID Deboraha Sprang, MD        No Known Allergies  Review of Systems negative except from HPI and PMH  Physical Exam BP 150/75  Pulse 65  Temp(Src) 98.3 F (36.8 C) (Oral)  Resp 18  Ht 6\' 3"  (1.905 m)  Wt 204 lb (92.534 kg)  BMI 25.50 kg/m2  SpO2 100% Well developed and well nourished in no acute distress HENT normal E scleral and icterus clear Neck Supple JVP flat; carotids brisk and full Clear to ausculation  Regular rate and rhythm, no murmurs gallops or rub Soft with active bowel sounds No clubbing cyanosis none Edema Alert and oriented, grossly normal motor and sensory  function Skin Warm and Dry  ECG  NSR LBBB  Assessment and  Plan  ICM CHF chronic systolic class [id number]  LBBB  HTN   Have reviewed the potential benefits and risks of lead implantation and generator replacement including but not limited to death, perforation of heart or lung, lead dislodgement, infection,  device malfunction and inappropriate shocks.    .  The patient understands, agrees and is willing to proceed.

## 2013-05-20 NOTE — Telephone Encounter (Signed)
escitalopram (LEXAPRO) 10 MG tablet pramipexole (MIRAPEX) 0.5 MG tablet ipratropium (ATROVENT) 0.03 % nasal spray glipiZIDE (GLUCOTROL) 10 MG tablet saxagliptin HCl (ONGLYZA) 5 MG TABS tablet metFORMIN (GLUCOPHAGE) 1000 MG tablet silodosin (RAPAFLO) 8 MG CAPS capsule   The pt would like these sent to [facility]

## 2013-05-20 NOTE — Telephone Encounter (Signed)
Sent by e-scribe. 

## 2013-05-20 NOTE — Interval H&P Note (Signed)
ICD Criteria  Current LVEF:19% ;Obtained < 1 month ago.  NYHA Functional Classification: Class III  Heart Failure History:  Yes, Duration of heart failure since onset is > 9 months  Non-Ischemic Dilated Cardiomyopathy History:  No.  Atrial Fibrillation/Atrial Flutter:  No.  Ventricular Tachycardia History:  No.  Cardiac Arrest History:  No  History of Syndromes with Risk of Sudden Death:  No.  Previous ICD:  Yes, ICD Type:  Single, Reason for ICD:  Primary prevention.  LVEF is not available  Electrophysiology Study: No.  Prior MI: Yes, Most recent MI timeframe is > 40 days.  PPM: No.  OSA:  No  Patient Life Expectancy of >=1 year: Yes.  Anticoagulation Therapy:  Patient is NOT on anticoagulation therapy.   Beta Blocker Therapy:  Yes.   Ace Inhibitor/ARB Therapy:  Yes.History and Physical Interval Note:  05/20/2013 8:03 AM  [name]  has presented today for surgery, with the diagnosis of asm  The various methods of treatment have been discussed with the patient and family. After consideration of risks, benefits and other options for treatment, the patient has consented to  Procedure(s): ICD GENERATOR CHANGE (N/A) BI-VENTRICULAR IMPLANTABLE CARDIOVERTER DEFIBRILLATOR UPGRADE (N/A) as a surgical intervention .  The patient's history has been reviewed, patient examined, no change in status, stable for surgery.  I have reviewed the patient's chart and labs.  Questions were answered to the patient's satisfaction.     Virl Axe

## 2013-05-20 NOTE — CV Procedure (Signed)
SOLAN LAMMERS [id number]  [id number]  Preop Dx: DEVICE at [facility]  Lead on recall CHF  ICM Postop Dx same/ externalizaiton  Procedure: Venogram , device explant, device implant, pocket revision, LV and RA lead insertion  Cx: None   Dictation number ?  Virl Axe, MD 05/20/2013 10:44 AM

## 2013-05-21 ENCOUNTER — Ambulatory Visit (HOSPITAL_COMMUNITY): Payer: Medicare Other

## 2013-05-21 DIAGNOSIS — I2510 Atherosclerotic heart disease of native coronary artery without angina pectoris: Secondary | ICD-10-CM | POA: Diagnosis not present

## 2013-05-21 DIAGNOSIS — I5022 Chronic systolic (congestive) heart failure: Secondary | ICD-10-CM | POA: Diagnosis not present

## 2013-05-21 DIAGNOSIS — R918 Other nonspecific abnormal finding of lung field: Secondary | ICD-10-CM | POA: Diagnosis not present

## 2013-05-21 DIAGNOSIS — I2589 Other forms of chronic ischemic heart disease: Secondary | ICD-10-CM

## 2013-05-21 DIAGNOSIS — Z4502 Encounter for adjustment and management of automatic implantable cardiac defibrillator: Secondary | ICD-10-CM | POA: Diagnosis not present

## 2013-05-21 DIAGNOSIS — I509 Heart failure, unspecified: Secondary | ICD-10-CM | POA: Diagnosis not present

## 2013-05-21 DIAGNOSIS — I447 Left bundle-branch block, unspecified: Secondary | ICD-10-CM | POA: Diagnosis not present

## 2013-05-21 LAB — GLUCOSE, CAPILLARY: Glucose-Capillary: 107 mg/dL — ABNORMAL HIGH (ref 70–99)

## 2013-05-21 NOTE — Discharge Summary (Signed)
ELECTROPHYSIOLOGY DISCHARGE SUMMARY   Patient ID: [name],  MRN: [id number], DOB/AGE: August 27, 1928 80 y.o.  Admit date: 05/20/2013 Discharge date: 05/21/2013  Primary Care Physician: Regis Bill, MD Primary Cardiologist: Stanford Breed, MD Primary EP: Caryl Comes, MD  Primary Discharge Diagnosis:  1. Ischemic CM s/p ICD implant previously which had reached ERI in setting of chronic systolic HF and LBBB, now s/p CRT-D upgrade  Secondary Discharge Diagnoses:  1. Ischemic CM, EF 25% 2. Chronic systolic HF 3. CAD s/p CABG 4. Prior CVA 5. OSA 6. DM 7. HTN 8. Dyslipidemia 9. Hypothyroidism 10. Autoimmune hemolytic anemia  Procedures This Admission:  1. Venogram, explant of previously implanted single chamber ICD, upgrade to CRT-D  RA lead - St Jude [phone] active fixation atrial lead, serial [id number] RV lead - previously inserted (old) St Jude 1581 dual-coil Riata lead, serial# [id number] (interrogated and stable) LV lead - St Jude LV lead, model 1458Q, serial# [id number] Device - [address] Jude Quadra Assura 3365-40C CRT-D serial# [id number]    History and Hospital Course:  [name] is a pleasant 80 year old gentleman with an ischemic CM, EF 25%, s/p previously implanted single chamber ICD, chronic systolic HF, LBBB, CAD s/p CABG, HTN and OSA. His ICD battery reached ERI and given his persistent LV dysfunction, CHF and LBBB, CRT-D was recommended. On 05/20/2013, [name] underwent CRT-D upgrade. Of note, his previously implanted RV Riata lead on recall was found to be functioning normally. Please see Dr Olin Pia operative report for full details. He tolerated this procedure well without any immediate complication. He remains hemodynamically stable and afebrile. His chest xray shows stable lead placement without pneumothorax. His device interrogation shows normal CRT-D function with stable lead parameters/measurements. His implant site is intact without significant bleeding or hematoma. He has been given  discharge instructions including wound care and activity restrictions. He will follow-up in 10 days for wound check. There were no changes made to his medications. He is currently on guideline-directed medical therapy including Coreg and ramipril. He has been seen, examined and deemed stable for discharge home today by Dr. Cristopher Peru.  Physical Exam: Vitals: Blood pressure 128/49, pulse 64, temperature 97.8 F (36.6 C), temperature source Oral, resp. rate 18, height 6\' 3"  (1.905 m), weight 204 lb (92.534 kg), SpO2 95.00%.  General: Well developed, well appearing 80 year old male in no acute distress. Neck: Supple. JVD not elevated. Lungs: Clear bilaterally to auscultation without wheezes, rales, or rhonchi. Breathing is unlabored. Heart: RRR S1 S2 without murmurs, rubs, or gallops.  Abdomen: Soft, non-distended. Extremities: No clubbing or cyanosis. No edema.  Distal pedal pulses are 2+ and equal bilaterally. Neuro: Alert and oriented X 3. Moves all extremities spontaneously. No focal deficits. Skin: Left upper chest / implant site intact with Steri-strips. Mild ecchymosis. No hematoma.  Labs: Lab Results  Component Value Date   WBC 11.3* 05/18/2013   HGB 13.9 05/18/2013   HCT 43.1 05/18/2013   MCV 96.2 05/18/2013   PLT 164.0 05/18/2013     Recent Labs Lab 05/18/13 1208  NA 141  K 3.9  CL 103  CO2 31  BUN 27*  CREATININE 1.4  CALCIUM 9.6  GLUCOSE 45*    Disposition:  The patient is being discharged in stable condition.  Follow-up:     Follow-up Information   Follow up with [facility] On 06/01/2013. (At 3:00 PM for wound check)    Specialty:  Cardiology   Contact information:   [address]  [address] [place] [address] [phone]      Follow up with Ileene Hutchinson, PA-C On 07/07/2013. (At 11:30 AM (with Dr. Olin Pia PA))    Specialty:  Cardiology   Contact information:   [address] [place] [address] [phone]         Follow up with Virl Axe, MD On 08/25/2013. (At 10:00 AM)    Specialty:  Cardiology   Contact information:   [id number] [place] [address] [phone]      Discharge Medications:    Medication List         amoxicillin-clavulanate 875-125 MG per tablet  Commonly known as:  AUGMENTIN  Take 1 tablet by mouth 2 (two) times daily.     aspirin EC 81 MG tablet  Take 81 mg by mouth daily.     atorvastatin 40 MG tablet  Commonly known as:  LIPITOR  Take 1 tablet (40 mg total) by mouth daily.     Benfotiamine 150 MG Caps  Take 300 mg by mouth at bedtime.     carvedilol 12.5 MG tablet  Commonly known as:  COREG  Take 1 tablet (12.5 mg total) by mouth 2 (two) times daily with a meal.     clonazePAM 1 MG tablet  Commonly known as:  KLONOPIN  TAKE 1 TABLET BY MOUTH EVERY NIGHT AT BEDTIME OR AS DIRECTED     escitalopram 10 MG tablet  Commonly known as:  LEXAPRO  Take 1 tablet (10 mg total) by mouth daily.     furosemide 40 MG tablet  Commonly known as:  LASIX  Take 0.5 tablets (20 mg total) by mouth daily.     glipiZIDE 10 MG tablet  Commonly known as:  GLUCOTROL  Take 1 tablet (10 mg total) by mouth 2 (two) times daily before a meal.     glucose blood test strip  Commonly known as:  ACCU-CHEK COMFORT CURVE  Use as instructed     glucose blood test strip  Use as instructed     Insulin Detemir 100 UNIT/ML Pen  Commonly known as:  LEVEMIR FLEXTOUCH  Inject 20 units at bedtime or as directed     ipratropium 0.03 % nasal spray  Commonly known as:  ATROVENT  Place 2 sprays into the nose 2 (two) times daily.     metFORMIN 1000 MG tablet  Commonly known as:  GLUCOPHAGE  Take 1 tablet (1,000 mg total) by mouth 2 (two) times daily with a meal.     niacin 500 MG CR tablet  Commonly known as:  NIASPAN  Take 250 mg by mouth at bedtime.     ONE TOUCH LANCETS Misc  Use as directed     pramipexole 0.5 MG tablet  Commonly known as:  MIRAPEX   Take 1 tablet (0.5 mg total) by mouth daily. For restless legs     ramipril 2.5 MG capsule  Commonly known as:  ALTACE  Take 1 capsule (2.5 mg total) by mouth 2 (two) times daily.     saxagliptin HCl 5 MG Tabs tablet  Commonly known as:  ONGLYZA  Take 1 tablet (5 mg total) by mouth daily.     silodosin 8 MG Caps capsule  Commonly known as:  RAPAFLO  Take 1 capsule (8 mg total) by mouth daily with breakfast.     spironolactone 25 MG tablet  Commonly known as:  ALDACTONE  Take 0.5 tablets (12.5 mg total) by  mouth daily.     Tavaborole 5 % Soln  Applied one drop each affected nail once daily for 12 months       Duration of Discharge Encounter: Greater than 30 minutes including physician time.  Signed, Ileene Hutchinson, PA-C 05/21/2013, 7:52 AM  EP Attending  Patient seen and examined. Agree with above history, physical exam, assessment and plan. Incision, ecg and interogation are all good this morning. Will discharge home with usual followup.  Mikle Bosworth.D.

## 2013-05-21 NOTE — Discharge Instructions (Signed)
Supplemental Discharge Instructions for  Pacemaker/Defibrillator Patients  Activity No heavy lifting or vigorous activity with your left/right arm for 6 to 8 weeks.  Do not raise your left/right arm above your head for one week.  Gradually raise your affected arm as drawn below.           03/07                      03/08                       03/09                      03/10       NO DRIVING for 1 week; you may begin driving on [id number]. WOUND CARE   Keep the wound area clean and dry.  You cannot get the wound wet for 7-10 days.  NO showers, soaking in a tub bath, swimming pool or hot tub for 7-10 days until wound is completely healed.    The Steri-strips on your wound will fall off on their own; do not pull them off.  No bandage is needed on the site.  DO NOT apply any creams, oils, or ointments to the wound area.   If you notice any drainage or discharge from the wound, any swelling or bruising at the site, or you develop a fever > 101? F after you are discharged home, call the office at once.  Special Instructions   You are still able to use cellular telephones; use the ear opposite the side where you have your pacemaker/defibrillator.  Avoid carrying your cellular phone near your device.   When traveling through airports, show security personnel your identification card to avoid being screened in the metal detectors.  Ask the security personnel to use the hand wand.   Avoid arc welding equipment, MRI testing (magnetic resonance imaging), TENS units (transcutaneous nerve stimulators).  Call the office for questions about other devices.   Avoid electrical appliances that are in poor condition or are not properly grounded.   Microwave ovens are safe to be near or to operate.  Additional information for defibrillator patients should your device go off:   If your device goes off ONCE and you feel fine afterward, notify the device clinic nurses.   If your device goes off ONCE and you do  not feel well afterward, call 911.   If your device goes off TWICE, call 911.   If your device goes off THREE times in one day, call 911.  DO NOT DRIVE YOURSELF OR A FAMILY MEMBER WITH A DEFIBRILLATOR TO THE HOSPITAL--CALL 911.  Biventricular Pacemaker Implantation A pacemaker is a small, lightweight, battery-powered device that is placed (implanted) under the skin in the upper chest. Your caregiver may prescribe a pacemaker for you if your heartbeat is too slow (bradycardia). A biventricular pacemaker is a pulse generator connected by wires called leads that go into the two lower chambers on the right and left sides of your heart (ventricles). It is used to treat symptoms of heart failure. The pulse generator is a small computer run by a battery. The generator creates a regular electronic pulse. The pulse is sent through the leads, which go through a blood vessel and into the ventricles of your heart. This type of pacemaker makes a weak heart more efficient. LET YOUR CAREGIVER KNOW ABOUT  Any allergies. Some allergies can cause serious  problems during the procedure. Allergies to shellfish or agents, such as iodine, used in liquids that enhance specific areas of your body on X-ray images (contrast dyes) are especially problematic.   All medicines you take. These include vitamins, herbs, eyedrops, over-the-counter medicines, and creams.   Use of steroids.   Problems with numbing medicines (anesthetics).   Bleeding problems.   Past surgeries.   Other health problems. RISKS AND COMPLICATIONS Implanting a biventricular pacemaker is usually a safe procedure but problems can occur. For example:   Too much bleeding may occur.   Infection may develop.   Blood vessels, your lungs, or your heart may be harmed.   The pacemaker may not make your condition better. BEFORE THE PROCEDURE   You may need to have blood tests, heart tests, or a chest X-ray done before the day of the  procedure.   Ask your caregiver about changing or stopping your regular medicines.   Make plans to have someone drive you home. You will usually need to stay in the hospital overnight after the procedure.   Stop smoking at least 24 hours before the procedure.   Take a bath or shower the night before the procedure. You may need to scrub your chest with a special type of soap.   Do not eat or drink anything after midnight the night before your procedure. Ask if it is okay to take any needed medicine with a small sip of water. PROCEDURE The procedure to put a pacemaker in your chest is usually done at a hospital in a room that has a large X-ray machine called a fluoroscope. The machine will be above you during the procedure. It will help your doctor see your heart during the procedure. Implanting a biventricular pacemaker usually takes 2 5 hours. Before the procedure:   Small monitors will be put on your body. They will be used to check your heart, blood pressure, and oxygen level.   A needle will be put into a vein in your hand or arm. This is called an intravenous (IV) access tube. Fluids and medicine will flow directly into your body through the IV tube.   Your chest will be cleaned with a germ-killing (antiseptic) solution. Your chest may be shaved.   You may be given medicine to help you relax (sedative).   You will be given a numbing medicine called a local anesthetic. This medicine will make your chest area have no feeling while the pacemaker is implanted. You will be sleepy but awake during the procedure. After you are numb the procedure will begin. The caregiver will:   Make a small cut (incision). This will make a pocket deep under your skin that will hold the pulse generator.   Guide the leads through a large blood vessel into your heart and attach them to the heart muscles.   Test the pacemaker.   Close the incision with stitches, glue, or staples. AFTER THE  PROCEDURE  You may feel pain. Some pain is normal. It may last a few days.   You may stay in a recovery area until the local anesthetic has worn off. Your blood pressure and pulse will be checked often. You will be taken to a room where your heart beat will be monitored.   A chest X-ray will be taken. This checks that the pacemaker is in the right place.   You may stay in the hospital overnight.  Heart Failure Heart failure means your heart has trouble pumping blood.  This makes it hard for your body to work well. Heart failure is usually a long-term (chronic) condition. You must take good care of yourself and follow your doctor's treatment plan. HOME CARE Take your heart medicine as told by your doctor. Do not stop taking medicine unless your doctor tells you to. Do not skip any dose of medicine. Refill your medicines before they run out. Take other medicines only as told by your doctor or pharmacist. Stay active if told by your doctor. The elderly and people with severe heart failure should talk with a doctor about physical activity. Eat heart healthy foods. Choose foods that are without trans fat and are low in saturated fat, cholesterol, and salt (sodium). This includes fresh or frozen fruits and vegetables, fish, lean meats, fat-free or low-fat dairy foods, whole grains, and high-fiber foods. Lentils and dried peas and beans (legumes) are also good choices. Limit salt if told by your doctor. Cook in a healthy way. Roast, grill, broil, bake, poach, steam, or stir-fry foods. Limit fluids as told by your doctor. Weigh yourself every morning. Do this after you pee (urinate) and before you eat breakfast. Write down your weight to give to your doctor. Take your blood pressure and write it down if your doctor tell you to. Ask your doctor how to check your pulse. Check your pulse as told. Lose weight if told by your doctor. Stop smoking or chewing tobacco. Do not use gum or patches that  help you quit without your doctor's approval. Schedule and go to doctor visits as told. Nonpregnant women should have no more than 1 drink a day. Men should have no more than 2 drinks a day. Talk to your doctor about drinking alcohol. Stop illegal drug use. Stay current with shots (immunizations). Manage your health conditions as told by your doctor. Learn to manage your stress. Rest when you are tired. If it is really hot outside: Avoid intense activities. Use air conditioning or fans, or get in a cooler place. Avoid caffeine and alcohol. Wear loose-fitting, lightweight, and light-colored clothing. If it is really cold outside: Avoid intense activities. Layer your clothing. Wear mittens or gloves, a hat, and a scarf when going outside. Avoid alcohol. Learn about heart failure and get support as needed. Get help to maintain or improve your quality of life and your ability to care for yourself as needed. GET HELP IF:  You gain 03 lb/1.4 kg or more in 1 day or 05 lb/2.3 kg in a week. You are more short of breath than usual. You cannot do your normal activities. You tire easily. You cough more than normal, especially with activity. You have any or more puffiness (swelling) in areas such as your hands, feet, ankles, or belly (abdomen). You cannot sleep because it is hard to breathe. You feel like your heart is beating fast (palpitations). You get dizzy or lightheaded when you stand up. GET HELP RIGHT AWAY IF:  You have trouble breathing. There is a change in mental status, such as becoming less alert or not being able to focus. You have chest pain or discomfort. You faint. MAKE SURE YOU:  Understand these instructions. Will watch your condition. Will get help right away if you are not doing well or get worse. Document Released: 12/13/2007 Document Revised: 06/30/2012 Document Reviewed: 10/04/2011 [facility] Patient Information 2014 [place], [place].   The pacemaker will be checked  before you go home. It can be adjusted if that is needed. Document Released: 11/28/2011 Document Reviewed:  11/28/2011 [employer] Patient Information 2014 [place].

## 2013-05-21 NOTE — Progress Notes (Signed)
Reviewed discharge instructions with patient and wife, they stated their understanding.  Patient wishes to remove his dressing once he gets home and understands that a dressing does not need to be replaced.  Discharged home with wife via wheelchair and volunteer.  Sanda Linger

## 2013-05-21 NOTE — Op Note (Signed)
NAMEQUANELL, WINDOM NO.:  [id number]  MEDICAL RECORD NO.:  [id number]  LOCATION:  3W34C                        FACILITY:  [facility]  PHYSICIAN:  Deboraha Sprang, MD, FACCDATE OF BIRTH:  22-Mar-1928  DATE OF PROCEDURE:  05/20/2013 DATE OF DISCHARGE:                              OPERATIVE REPORT   PREOPERATIVE DIAGNOSIS:  Previously implanted ICD at [facility] recall lead, congestive heart failure, and ischemic cardiomyopathy.  POSTOPERATIVE DIAGNOSIS:  Previously implanted ICD at [facility] recall lead, congestive heart failure, and ischemic cardiomyopathy; evidence of conductor externalization-mild.  PROCEDURES:  Explantation of a previously implanted device, venogram, insertion of an RV lead, insertion of an atrial lead, insertion of a biventricular defibrillator with high-voltage lead testing.  DESCRIPTION OF PROCEDURE:  Following obtaining informed consent, the patient was brought to the electrophysiology laboratory and placed on the fluoroscopic table in supine position.  After routine prep and drape in the left upper chest, a venogram having demonstrating patency of the extrathoracic left subclavian vein.  An incision was made and carried down to layer of the prepectoral fascia using electrocautery and sharp dissection.  The pocket was not opened.  Access was obtained with some difficulty.  There was no aspiration of the air.  Subsequently 2 venipunctures were accomplished and sequentially a 9.5 and 7-French sheaths were placed through which we passed a Saint Jude 135 degree LV coronary sinus cannulation catheter and a Saint Jude [id number] active fixation atrial lead, serial [id number].  With some degree of difficulty, we cannulated the coronary sinus and venography demonstrated a low posterior lateral branch and a high anterolateral branch.  We targeted the latter initially.  We were able to deploy a wire; however, we were not able to deploy the lead.  We attempted to  use a Medtronic Attain two system to pass a dilator over the wire.  In our attempts to exchange the wire, we were unable to maintain position in the vein.  The Whisper wire that we had used was insufficient to support even with passing of the lead or the passing of the dilator.  We then sought to target the lower vein.  It had a number of laterally directed ramifications and these were located at the halfway point between base and apex.  We were able to cannulate one of these and insert the [place] lead, model 1458Q, serial# [id number].  In its location, outputs were high programing between poles on the lead.  As there was no other target, we elected to deploy the lead in this location where the R-wave was greater than 35 and impedance was 130 ohms.  The 9.5-French sheath was removed and CS sheath left in place, and the right atrial lead described above was manipulated.  The right atrial appendage remnant where the bipolar P-wave was 4 with a pace impedance of 561, threshold 1.8 V at 0.5 milliseconds, current threshold is 3.0 and the current of injury was brisk.  There was no diaphragmatic pacing at 10 V.  At this point, the LV delivery system was removed and the lead was secured.  We then turned our attention to opening up the pocket of the previously implanted device.  Interrogation of the previously inserted [facility] 1581 dual-coil Riata lead, serial# [id number] demonstrated an amplitude of greater than 35 with a pace impedance of 380, threshold 0.9 at 0.5.  Current threshold was 2.2 mA.  At this point, the pocket was expanded to hold the new device, which has a longer North[place] axis. Please add pocket revision to the procedure log.  Through the device, bipolar P-wave 3.9 with a pace impedance of 550, threshold 0.5 V at 0.5. The R-wave was greater than 12 with a pace impedance of 360, a threshold 1 V at 0.5, and the LV impedance was 480 with a threshold of 1.5 at 1 in LV 4 to  coil configuration, 4-2 had a pacing threshold of about 3 V, and all the other thresholds were greater than 4.  There was no diaphragmatic pacing at maximum output in this configuration.  The high-voltage impedance was 43 ohms.  I should also note that pacing impedances through the high-voltage coils prior to insertion of the new device were 297 and 282 respectively.  The pocket was copiously irrigated with antibiotic containing saline solution.  Hemostasis was assured.  Leads in the pulse generator were then placed in the pocket and secured to the prepectoral fascia. Surgicel was placed at the cephalad, medial, and caudal aspects of the pocket.  The wound was closed in 2 layers in normal fashion.  A Dermabond dressing was applied.  Needle counts, sponge counts, and instrument counts were correct at the end of the procedure according to staff.  The patient tolerated the procedure without apparent complication.     Deboraha Sprang, MD, [facility]     SCK/MEDQ  D:  05/20/2013  T:  05/21/2013  Job:  [id number]

## 2013-05-22 ENCOUNTER — Ambulatory Visit (INDEPENDENT_AMBULATORY_CARE_PROVIDER_SITE_OTHER): Payer: Medicare Other | Admitting: *Deleted

## 2013-05-22 ENCOUNTER — Telehealth: Payer: Self-pay | Admitting: Internal Medicine

## 2013-05-22 DIAGNOSIS — Z95810 Presence of automatic (implantable) cardiac defibrillator: Secondary | ICD-10-CM

## 2013-05-22 NOTE — Telephone Encounter (Signed)
Follow up   Pt called and is holding for EP department. Needs answer to his question.

## 2013-05-22 NOTE — Telephone Encounter (Signed)
Appt made to be seen 05/22/13 @3 :30

## 2013-05-22 NOTE — Telephone Encounter (Signed)
New message    Had defibulator procedure on Wednesday.  Left hand is swollen.  Is this normal?

## 2013-05-22 NOTE — Progress Notes (Signed)
Wound pre-check. Pt's arm & hand noticeably swelling. Per AS/KM, pt instructed to keep arm elevated on top of a pillow when sitting.  ROV w/ Dr. Caryl Comes 05/25/13 @ 2:00.

## 2013-05-26 ENCOUNTER — Encounter (HOSPITAL_COMMUNITY): Payer: Self-pay | Admitting: *Deleted

## 2013-05-28 ENCOUNTER — Encounter: Payer: Self-pay | Admitting: Internal Medicine

## 2013-05-28 ENCOUNTER — Ambulatory Visit (INDEPENDENT_AMBULATORY_CARE_PROVIDER_SITE_OTHER): Payer: Medicare Other | Admitting: *Deleted

## 2013-05-28 DIAGNOSIS — I428 Other cardiomyopathies: Secondary | ICD-10-CM

## 2013-05-28 NOTE — Progress Notes (Signed)
Wound check only for left arm swelling. Swelling has went down in arm with minimal swelling at pocket site. Pt to be in for wound check on 06-01-13 @ 1100 and let SK evaluate site/kwm

## 2013-06-01 ENCOUNTER — Ambulatory Visit (INDEPENDENT_AMBULATORY_CARE_PROVIDER_SITE_OTHER): Payer: Medicare Other | Admitting: *Deleted

## 2013-06-01 ENCOUNTER — Ambulatory Visit: Payer: Medicare Other

## 2013-06-01 DIAGNOSIS — I2589 Other forms of chronic ischemic heart disease: Secondary | ICD-10-CM | POA: Diagnosis not present

## 2013-06-01 DIAGNOSIS — I5022 Chronic systolic (congestive) heart failure: Secondary | ICD-10-CM

## 2013-06-01 DIAGNOSIS — I255 Ischemic cardiomyopathy: Secondary | ICD-10-CM

## 2013-06-01 LAB — MDC_IDC_ENUM_SESS_TYPE_INCLINIC
Battery Remaining Longevity: 57.6 mo
Brady Statistic RA Percent Paced: 39 %
Date Time Interrogation Session: 20150316111956
HIGH POWER IMPEDANCE MEASURED VALUE: 40.4944
HighPow Impedance: 40 Ohm
Lead Channel Impedance Value: 362.5 Ohm
Lead Channel Impedance Value: 487.5 Ohm
Lead Channel Pacing Threshold Amplitude: 0.625 V
Lead Channel Pacing Threshold Pulse Width: 0.5 ms
Lead Channel Pacing Threshold Pulse Width: 1 ms
Lead Channel Sensing Intrinsic Amplitude: 12 mV
Lead Channel Sensing Intrinsic Amplitude: 2.6 mV
Lead Channel Setting Pacing Amplitude: 2.125
Lead Channel Setting Pacing Amplitude: 2.875
Lead Channel Setting Pacing Pulse Width: 0.5 ms
Lead Channel Setting Pacing Pulse Width: 1 ms
MDC IDC MSMT LEADCHNL LV PACING THRESHOLD AMPLITUDE: 1.875 V
MDC IDC MSMT LEADCHNL RA IMPEDANCE VALUE: 437.5 Ohm
MDC IDC MSMT LEADCHNL RV PACING THRESHOLD AMPLITUDE: 1.125 V
MDC IDC MSMT LEADCHNL RV PACING THRESHOLD PULSEWIDTH: 0.5 ms
MDC IDC PG SERIAL: 7170478
MDC IDC SET LEADCHNL RA PACING AMPLITUDE: 1.625
MDC IDC SET LEADCHNL RV SENSING SENSITIVITY: 0.5 mV
MDC IDC SET ZONE DETECTION INTERVAL: 375 ms
MDC IDC STAT BRADY RV PERCENT PACED: 96 %
Zone Setting Detection Interval: 250 ms
Zone Setting Detection Interval: 300 ms

## 2013-06-01 NOTE — Progress Notes (Signed)
Wound check appointment. Steri-strips removed. Wound without redness or edema. Incision edges approximated, wound well healed. Normal device function. Thresholds, sensing, and impedances consistent with implant measurements. Device programmed at 3.5V for extra safety margin until 3 month visit. Histogram distribution appropriate for patient and level of activity. No mode switches or ventricular arrhythmias noted.  2.9% PVC count.  Patient educated about wound care, arm mobility, lifting restrictions, shock plan. ROV in 3 months with implanting physician.

## 2013-06-02 DIAGNOSIS — Z85828 Personal history of other malignant neoplasm of skin: Secondary | ICD-10-CM | POA: Diagnosis not present

## 2013-06-02 DIAGNOSIS — L570 Actinic keratosis: Secondary | ICD-10-CM | POA: Diagnosis not present

## 2013-06-02 DIAGNOSIS — D0460 Carcinoma in situ of skin of unspecified upper limb, including shoulder: Secondary | ICD-10-CM | POA: Diagnosis not present

## 2013-06-02 DIAGNOSIS — D485 Neoplasm of uncertain behavior of skin: Secondary | ICD-10-CM | POA: Diagnosis not present

## 2013-06-02 DIAGNOSIS — C44529 Squamous cell carcinoma of skin of other part of trunk: Secondary | ICD-10-CM | POA: Diagnosis not present

## 2013-06-04 ENCOUNTER — Telehealth: Payer: Self-pay | Admitting: Internal Medicine

## 2013-06-04 NOTE — Telephone Encounter (Signed)
Pt instructed to record BS daily, pt has 2 weeks worth. Pt would like to know if its ok to mail these results, or bring to office. Pt states his BS have gone down w' his new meter.

## 2013-06-05 NOTE — Telephone Encounter (Signed)
MyChart would be the preferable choice of communication but if that is not a possibility than pt may bring them by or mail them.

## 2013-06-08 NOTE — Telephone Encounter (Signed)
Pt mailed those reding. Pt would like a cb after you read.

## 2013-06-16 DIAGNOSIS — C44529 Squamous cell carcinoma of skin of other part of trunk: Secondary | ICD-10-CM | POA: Diagnosis not present

## 2013-06-22 ENCOUNTER — Encounter: Payer: Self-pay | Admitting: Internal Medicine

## 2013-06-22 ENCOUNTER — Other Ambulatory Visit: Payer: Self-pay | Admitting: Internal Medicine

## 2013-06-22 NOTE — Progress Notes (Signed)
Blood sugars mailed to yus reviewed  Fasting 80-69  Afternoon 93 68 140   2 weeks of readings  In march .

## 2013-06-23 ENCOUNTER — Encounter: Payer: Self-pay | Admitting: Internal Medicine

## 2013-06-24 NOTE — Telephone Encounter (Signed)
Last filled on 05/21/13 #30 with 0 additional refills. Last seen on 04/02/13.  Has no future appt. Please advise.  Thanks!

## 2013-06-25 NOTE — Telephone Encounter (Signed)
Ok x 1  Due for fu hga1c and ov  Soon. See last note  January  Have him arrange this

## 2013-06-26 ENCOUNTER — Telehealth: Payer: Self-pay | Admitting: Internal Medicine

## 2013-06-26 ENCOUNTER — Telehealth: Payer: Self-pay | Admitting: Family Medicine

## 2013-06-26 ENCOUNTER — Ambulatory Visit (INDEPENDENT_AMBULATORY_CARE_PROVIDER_SITE_OTHER): Payer: Medicare Other | Admitting: Internal Medicine

## 2013-06-26 ENCOUNTER — Ambulatory Visit (INDEPENDENT_AMBULATORY_CARE_PROVIDER_SITE_OTHER): Payer: Medicare Other | Admitting: *Deleted

## 2013-06-26 ENCOUNTER — Encounter: Payer: Self-pay | Admitting: Internal Medicine

## 2013-06-26 VITALS — BP 134/76 | HR 64

## 2013-06-26 DIAGNOSIS — I2510 Atherosclerotic heart disease of native coronary artery without angina pectoris: Secondary | ICD-10-CM

## 2013-06-26 DIAGNOSIS — R0602 Shortness of breath: Secondary | ICD-10-CM

## 2013-06-26 DIAGNOSIS — I2589 Other forms of chronic ischemic heart disease: Secondary | ICD-10-CM | POA: Diagnosis not present

## 2013-06-26 DIAGNOSIS — I4891 Unspecified atrial fibrillation: Secondary | ICD-10-CM

## 2013-06-26 DIAGNOSIS — Z79899 Other long term (current) drug therapy: Secondary | ICD-10-CM

## 2013-06-26 LAB — MDC_IDC_ENUM_SESS_TYPE_INCLINIC: Implantable Pulse Generator Serial Number: 7170478

## 2013-06-26 MED ORDER — APIXABAN 5 MG PO TABS: 5.0000 mg | ORAL_TABLET | Freq: Two times a day (BID) | ORAL | Status: DC

## 2013-06-26 NOTE — Telephone Encounter (Signed)
Patient is SOB with minimal activity since last several days.   Has occas cough, "maybe a little" ankle edema.  Has not been sick lately.  Spoke with Cyril Mourning in EP, states patient should be able to send a transmission for her to review.  Patient does not have a new transmitter for his recently replaced defibrillator.  Patient will attempt a transmission at this time.  Cyril Mourning will call him this am after 10:15 am.
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
## 2013-06-26 NOTE — Progress Notes (Signed)
Pt seen today for increased SOB x 3-4 days. On interrogation, Pt in AF since 4-10  around 11am. new onset of AF. Pt seen by Dr Caryl Comes as well due to new diagnosis. Pt to start Eliquis and follow up with Brooke on 07-07-13.

## 2013-06-26 NOTE — Telephone Encounter (Signed)
Spoke with [employer].  Patients transmission did not come through.  Cyril Mourning will see him today for device check around 2pm.  Informed patient who verbalizes understanding.

## 2013-06-26 NOTE — Telephone Encounter (Signed)
New message    Pt had defibulator replaced last month--within the last couple of days, pt states he has been sob.

## 2013-06-26 NOTE — Telephone Encounter (Signed)
Called to the pharmacy and left on voicemail. 

## 2013-06-26 NOTE — Telephone Encounter (Signed)
This patient should have a May appt for follow up per [facility].  Please call the pt and make the appt.  Thanks!

## 2013-06-26 NOTE — Patient Instructions (Addendum)
START ELIQUIS 5 MG TWICE A DAY  STOP YOUR ASPIRIN  INCREASE YOUR LASIX TO 40 MG DAILY X 1 WEEK   Return on Thursday  4/16 for labs (bmet)  Would like for you to go to the [facility] across from [facility] for chest xray soon between the hours of 8:30-4:00

## 2013-06-29 ENCOUNTER — Ambulatory Visit (INDEPENDENT_AMBULATORY_CARE_PROVIDER_SITE_OTHER)
Admission: RE | Admit: 2013-06-29 | Discharge: 2013-06-29 | Disposition: A | Discharge status: Home / Self Care | Payer: Medicare Other | Source: Ambulatory Visit | Attending: Internal Medicine | Admitting: Internal Medicine

## 2013-06-29 DIAGNOSIS — I4891 Unspecified atrial fibrillation: Secondary | ICD-10-CM | POA: Diagnosis not present

## 2013-06-29 DIAGNOSIS — R0602 Shortness of breath: Secondary | ICD-10-CM

## 2013-06-30 NOTE — Telephone Encounter (Signed)
appt made for pt

## 2013-07-01 DIAGNOSIS — H1130 Conjunctival hemorrhage, unspecified eye: Secondary | ICD-10-CM | POA: Diagnosis not present

## 2013-07-02 ENCOUNTER — Other Ambulatory Visit (INDEPENDENT_AMBULATORY_CARE_PROVIDER_SITE_OTHER): Payer: Medicare Other

## 2013-07-02 DIAGNOSIS — Z79899 Other long term (current) drug therapy: Secondary | ICD-10-CM

## 2013-07-02 LAB — BASIC METABOLIC PANEL
BUN: 31 mg/dL — AB (ref 6–23)
CALCIUM: 9.5 mg/dL (ref 8.4–10.5)
CO2: 32 mEq/L (ref 19–32)
Chloride: 103 mEq/L (ref 96–112)
Creatinine, Ser: 1.4 mg/dL (ref 0.4–1.5)
GFR: 51.43 mL/min — AB (ref >60.00)
Glucose, Bld: 104 mg/dL — ABNORMAL HIGH (ref 70–99)
Potassium: 3.9 mEq/L (ref 3.5–5.1)
Sodium: 142 mEq/L (ref 135–145)

## 2013-07-07 ENCOUNTER — Telehealth: Payer: Self-pay | Admitting: *Deleted

## 2013-07-07 ENCOUNTER — Encounter: Payer: Self-pay | Admitting: Internal Medicine

## 2013-07-07 ENCOUNTER — Encounter: Payer: Self-pay | Admitting: Cardiology

## 2013-07-07 ENCOUNTER — Ambulatory Visit (INDEPENDENT_AMBULATORY_CARE_PROVIDER_SITE_OTHER): Payer: Medicare Other | Admitting: Cardiology

## 2013-07-07 VITALS — BP 106/64 | HR 96 | Ht 74.0 in | Wt 216.0 lb

## 2013-07-07 DIAGNOSIS — I5023 Acute on chronic systolic (congestive) heart failure: Secondary | ICD-10-CM

## 2013-07-07 DIAGNOSIS — Z95810 Presence of automatic (implantable) cardiac defibrillator: Secondary | ICD-10-CM | POA: Diagnosis not present

## 2013-07-07 DIAGNOSIS — I5022 Chronic systolic (congestive) heart failure: Secondary | ICD-10-CM

## 2013-07-07 DIAGNOSIS — I255 Ischemic cardiomyopathy: Secondary | ICD-10-CM

## 2013-07-07 DIAGNOSIS — I4891 Unspecified atrial fibrillation: Secondary | ICD-10-CM | POA: Diagnosis not present

## 2013-07-07 DIAGNOSIS — I2510 Atherosclerotic heart disease of native coronary artery without angina pectoris: Secondary | ICD-10-CM

## 2013-07-07 DIAGNOSIS — I509 Heart failure, unspecified: Secondary | ICD-10-CM

## 2013-07-07 DIAGNOSIS — I2589 Other forms of chronic ischemic heart disease: Secondary | ICD-10-CM

## 2013-07-07 LAB — MDC_IDC_ENUM_SESS_TYPE_INCLINIC
Battery Remaining Longevity: 66 mo
Brady Statistic RV Percent Paced: 67 %
Date Time Interrogation Session: 20150421154354
HighPow Impedance: 41 Ohm
Implantable Pulse Generator Serial Number: 7170478
Lead Channel Impedance Value: 560 Ohm
Lead Channel Pacing Threshold Amplitude: 1 V
Lead Channel Pacing Threshold Amplitude: 1.75 V
Lead Channel Pacing Threshold Pulse Width: 1 ms
Lead Channel Sensing Intrinsic Amplitude: 1.7 mV
Lead Channel Setting Sensing Sensitivity: 0.5 mV
MDC IDC MSMT LEADCHNL RA IMPEDANCE VALUE: 480 Ohm
MDC IDC MSMT LEADCHNL RV IMPEDANCE VALUE: 380 Ohm
MDC IDC MSMT LEADCHNL RV PACING THRESHOLD PULSEWIDTH: 0.5 ms
MDC IDC MSMT LEADCHNL RV SENSING INTR AMPL: 12 mV
MDC IDC SET LEADCHNL LV PACING AMPLITUDE: 2.75 V
MDC IDC SET LEADCHNL LV PACING PULSEWIDTH: 1 ms
MDC IDC SET LEADCHNL RA PACING AMPLITUDE: 2 V
MDC IDC SET LEADCHNL RV PACING AMPLITUDE: 2 V
MDC IDC SET LEADCHNL RV PACING PULSEWIDTH: 0.5 ms
MDC IDC SET ZONE DETECTION INTERVAL: 300 ms
MDC IDC STAT BRADY RA PERCENT PACED: 20 %
Zone Setting Detection Interval: 250 ms
Zone Setting Detection Interval: 375 ms

## 2013-07-07 MED ORDER — FUROSEMIDE 20 MG PO TABS: ORAL_TABLET | ORAL | Status: DC

## 2013-07-07 MED ORDER — CARVEDILOL 12.5 MG PO TABS: ORAL_TABLET | ORAL | Status: DC
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
## 2013-07-07 NOTE — Patient Instructions (Addendum)
Your physician has recommended you make the following change in your medication:   1. Increase your Lasix for 1 week to Lasix 20 mg every other day alternating with  40 mg every other day ( example 20 mg Monday ,40 mg Tuesday and so on). After 1 week go back to the 20 mg 1 tab daily   Your physician recommends that you schedule a follow-up appointment next week with Dr. Caryl Comes   Your physician recommends that you continue on your current medications as directed. Please refer to the Current Medication list given to you today.

## 2013-07-07 NOTE — Telephone Encounter (Signed)
Pt will call back to identify what medications he's on specifically the carvedilol if he's taken 12.5 mg bid we will increase this med to 1 1/2 tab bid per [employer]

## 2013-07-09 ENCOUNTER — Encounter: Payer: Self-pay | Admitting: Internal Medicine

## 2013-07-13 DIAGNOSIS — H353190 Nonexudative age-related macular degeneration, unspecified eye, stage unspecified: Secondary | ICD-10-CM | POA: Diagnosis not present

## 2013-07-13 DIAGNOSIS — H35039 Hypertensive retinopathy, unspecified eye: Secondary | ICD-10-CM | POA: Diagnosis not present

## 2013-07-13 DIAGNOSIS — E119 Type 2 diabetes mellitus without complications: Secondary | ICD-10-CM | POA: Diagnosis not present

## 2013-07-13 DIAGNOSIS — Z961 Presence of intraocular lens: Secondary | ICD-10-CM | POA: Diagnosis not present

## 2013-07-13 DIAGNOSIS — H26499 Other secondary cataract, unspecified eye: Secondary | ICD-10-CM | POA: Diagnosis not present

## 2013-07-13 LAB — HM DIABETES EYE EXAM

## 2013-07-16 ENCOUNTER — Ambulatory Visit (INDEPENDENT_AMBULATORY_CARE_PROVIDER_SITE_OTHER): Payer: Medicare Other | Admitting: Internal Medicine

## 2013-07-16 ENCOUNTER — Encounter: Payer: Self-pay | Admitting: Internal Medicine

## 2013-07-16 ENCOUNTER — Encounter: Payer: Self-pay | Admitting: *Deleted

## 2013-07-16 VITALS — BP 122/68 | HR 117 | Ht 74.0 in | Wt 221.0 lb

## 2013-07-16 DIAGNOSIS — I2589 Other forms of chronic ischemic heart disease: Secondary | ICD-10-CM | POA: Diagnosis not present

## 2013-07-16 DIAGNOSIS — I5023 Acute on chronic systolic (congestive) heart failure: Secondary | ICD-10-CM | POA: Diagnosis not present

## 2013-07-16 DIAGNOSIS — I255 Ischemic cardiomyopathy: Secondary | ICD-10-CM

## 2013-07-16 DIAGNOSIS — I2510 Atherosclerotic heart disease of native coronary artery without angina pectoris: Secondary | ICD-10-CM

## 2013-07-16 DIAGNOSIS — I509 Heart failure, unspecified: Secondary | ICD-10-CM

## 2013-07-16 DIAGNOSIS — Z95810 Presence of automatic (implantable) cardiac defibrillator: Secondary | ICD-10-CM | POA: Diagnosis not present

## 2013-07-16 DIAGNOSIS — I4891 Unspecified atrial fibrillation: Secondary | ICD-10-CM | POA: Diagnosis not present

## 2013-07-16 DIAGNOSIS — Z01812 Encounter for preprocedural laboratory examination: Secondary | ICD-10-CM

## 2013-07-16 DIAGNOSIS — I5022 Chronic systolic (congestive) heart failure: Secondary | ICD-10-CM

## 2013-07-16 LAB — CBC WITH DIFFERENTIAL/PLATELET
Basophils Absolute: 0 10*3/uL (ref 0.0–0.1)
Basophils Relative: 0.1 % (ref 0.0–3.0)
EOS PCT: 1 % (ref 0.0–5.0)
Eosinophils Absolute: 0.1 10*3/uL (ref 0.0–0.7)
HEMATOCRIT: 40 % (ref 39.0–52.0)
Hemoglobin: 13.1 g/dL (ref 13.0–17.0)
LYMPHS ABS: 1.6 10*3/uL (ref 0.7–4.0)
Lymphocytes Relative: 14.1 % (ref 12.0–46.0)
MCHC: 32.7 g/dL (ref 30.0–36.0)
MCV: 93.9 fl (ref 78.0–100.0)
Monocytes Absolute: 0.6 10*3/uL (ref 0.1–1.0)
Monocytes Relative: 5.7 % (ref 3.0–12.0)
Neutro Abs: 9.1 10*3/uL — ABNORMAL HIGH (ref 1.4–7.7)
Neutrophils Relative %: 79.1 % — ABNORMAL HIGH (ref 43.0–77.0)
Platelets: 143 10*3/uL — ABNORMAL LOW (ref 150.0–400.0)
RBC: 4.26 Mil/uL (ref 4.22–5.81)
RDW: 14.2 % (ref 11.5–14.6)
WBC: 11.5 10*3/uL — AB (ref 4.5–10.5)

## 2013-07-16 LAB — BASIC METABOLIC PANEL
BUN: 27 mg/dL — AB (ref 6–23)
CHLORIDE: 104 meq/L (ref 96–112)
CO2: 29 mEq/L (ref 19–32)
Calcium: 9.3 mg/dL (ref 8.4–10.5)
Creatinine, Ser: 1.5 mg/dL (ref 0.4–1.5)
GFR: 48.99 mL/min — ABNORMAL LOW (ref >60.00)
Glucose, Bld: 173 mg/dL — ABNORMAL HIGH (ref 70–99)
POTASSIUM: 4.7 meq/L (ref 3.5–5.1)
SODIUM: 139 meq/L (ref 135–145)

## 2013-07-16 MED ORDER — FUROSEMIDE 40 MG PO TABS: 60.0000 mg | ORAL_TABLET | Freq: Every day | ORAL | Status: DC

## 2013-07-16 MED ORDER — FUROSEMIDE 20 MG PO TABS: ORAL_TABLET | ORAL | Status: DC

## 2013-07-16 NOTE — Patient Instructions (Addendum)
Your physician has recommended you make the following change in your medication:  1) INCREASE Furosemide to 60 mg every morning (1 1/2 tablets)  Your physician recommends that you return for lab work today: BMET/CBCD  Your physician has recommended that you have a Cardioversion (DCCV). Electrical Cardioversion uses a jolt of electricity to your heart either through paddles or wired patches attached to your chest. This is a controlled, usually prescheduled, procedure. Defibrillation is done under light anesthesia in the hospital, and you usually go home the day of the procedure. This is done to get your heart back into a normal rhythm. You are not awake for the procedure. Please see the instruction sheet given to you today.  You are scheduled for 07/20/13
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
## 2013-07-19 MED ORDER — SODIUM CHLORIDE 0.9 % IV SOLN
INTRAVENOUS | Status: DC
  Administered 2013-07-20: 12:00:00 via INTRAVENOUS

## 2013-07-20 ENCOUNTER — Encounter (HOSPITAL_COMMUNITY): Payer: Self-pay | Admitting: *Deleted

## 2013-07-20 ENCOUNTER — Encounter (HOSPITAL_COMMUNITY): Payer: Medicare Other | Admitting: Anesthesiology

## 2013-07-20 ENCOUNTER — Other Ambulatory Visit: Payer: Self-pay | Admitting: Internal Medicine

## 2013-07-20 ENCOUNTER — Ambulatory Visit (HOSPITAL_COMMUNITY)
Admission: RE | Admit: 2013-07-20 | Discharge: 2013-07-20 | Disposition: A | Discharge status: Home / Self Care | Payer: Medicare Other | Source: Ambulatory Visit | Attending: Internal Medicine | Admitting: Internal Medicine

## 2013-07-20 ENCOUNTER — Encounter (HOSPITAL_COMMUNITY)
Admission: RE | Disposition: A | Discharge status: Home / Self Care | Payer: Self-pay | Source: Ambulatory Visit | Attending: Internal Medicine

## 2013-07-20 ENCOUNTER — Ambulatory Visit (HOSPITAL_COMMUNITY): Payer: Medicare Other | Admitting: Anesthesiology

## 2013-07-20 DIAGNOSIS — G4733 Obstructive sleep apnea (adult) (pediatric): Secondary | ICD-10-CM | POA: Insufficient documentation

## 2013-07-20 DIAGNOSIS — G2581 Restless legs syndrome: Secondary | ICD-10-CM | POA: Insufficient documentation

## 2013-07-20 DIAGNOSIS — I252 Old myocardial infarction: Secondary | ICD-10-CM | POA: Diagnosis not present

## 2013-07-20 DIAGNOSIS — Z95810 Presence of automatic (implantable) cardiac defibrillator: Secondary | ICD-10-CM

## 2013-07-20 DIAGNOSIS — I509 Heart failure, unspecified: Secondary | ICD-10-CM | POA: Diagnosis not present

## 2013-07-20 DIAGNOSIS — Z951 Presence of aortocoronary bypass graft: Secondary | ICD-10-CM | POA: Diagnosis not present

## 2013-07-20 DIAGNOSIS — E785 Hyperlipidemia, unspecified: Secondary | ICD-10-CM | POA: Insufficient documentation

## 2013-07-20 DIAGNOSIS — I5023 Acute on chronic systolic (congestive) heart failure: Secondary | ICD-10-CM

## 2013-07-20 DIAGNOSIS — I4891 Unspecified atrial fibrillation: Secondary | ICD-10-CM

## 2013-07-20 DIAGNOSIS — I255 Ischemic cardiomyopathy: Secondary | ICD-10-CM

## 2013-07-20 DIAGNOSIS — I10 Essential (primary) hypertension: Secondary | ICD-10-CM | POA: Diagnosis not present

## 2013-07-20 DIAGNOSIS — E119 Type 2 diabetes mellitus without complications: Secondary | ICD-10-CM | POA: Diagnosis not present

## 2013-07-20 DIAGNOSIS — I5022 Chronic systolic (congestive) heart failure: Secondary | ICD-10-CM

## 2013-07-20 DIAGNOSIS — M129 Arthropathy, unspecified: Secondary | ICD-10-CM | POA: Insufficient documentation

## 2013-07-20 DIAGNOSIS — I2589 Other forms of chronic ischemic heart disease: Secondary | ICD-10-CM | POA: Diagnosis not present

## 2013-07-20 DIAGNOSIS — Z01812 Encounter for preprocedural laboratory examination: Secondary | ICD-10-CM

## 2013-07-20 HISTORY — PX: CARDIOVERSION: SHX1299

## 2013-07-20 LAB — BASIC METABOLIC PANEL
BUN: 35 mg/dL — ABNORMAL HIGH (ref 6–23)
CALCIUM: 9 mg/dL (ref 8.4–10.5)
CO2: 27 mEq/L (ref 19–32)
Chloride: 102 mEq/L (ref 96–112)
Creatinine, Ser: 1.44 mg/dL — ABNORMAL HIGH (ref 0.50–1.35)
GFR, EST AFRICAN AMERICAN: 50 mL/min — AB (ref >90)
GFR, EST NON AFRICAN AMERICAN: 43 mL/min — AB (ref >90)
Glucose, Bld: 158 mg/dL — ABNORMAL HIGH (ref 70–99)
POTASSIUM: 4.2 meq/L (ref 3.7–5.3)
SODIUM: 142 meq/L (ref 137–147)

## 2013-07-20 LAB — GLUCOSE, CAPILLARY: Glucose-Capillary: 138 mg/dL — ABNORMAL HIGH (ref 70–99)

## 2013-07-20 SURGERY — CARDIOVERSION
Anesthesia: Monitor Anesthesia Care

## 2013-07-20 MED ORDER — SODIUM CHLORIDE 0.9 % IV SOLN
INTRAVENOUS | Status: DC | PRN
  Administered 2013-07-20: 13:00:00 via INTRAVENOUS

## 2013-07-20 MED ORDER — PROPOFOL 10 MG/ML IV BOLUS
INTRAVENOUS | Status: DC | PRN
  Administered 2013-07-20: 40 mg via INTRAVENOUS

## 2013-07-20 MED ORDER — LIDOCAINE HCL (PF) 2 % IJ SOLN
INTRAMUSCULAR | Status: DC | PRN
  Administered 2013-07-20: 40 mg via INTRADERMAL

## 2013-07-20 NOTE — Anesthesia Preprocedure Evaluation (Addendum)
Anesthesia Evaluation  Patient identified by MRN, date of birth, ID band Patient awake    Reviewed: Allergy & Precautions, H&P , NPO status , Patient's Chart, lab work & pertinent test results, reviewed documented beta blocker date and time   Airway Mallampati: II TM Distance: >3 FB Neck ROM: full    Dental   Pulmonary sleep apnea , former smoker,  breath sounds clear to auscultation        Cardiovascular hypertension, On Medications and On Home Beta Blockers + CAD, + Past MI, + CABG and +CHF Atrial Fibrillation + Cardiac Defibrillator Rhythm:regular     Neuro/Psych PSYCHIATRIC DISORDERS negative neurological ROS     GI/Hepatic negative GI ROS, Neg liver ROS,   Endo/Other  negative endocrine ROSdiabetesHypothyroidism   Renal/GU Renal InsufficiencyRenal disease  negative genitourinary   Musculoskeletal   Abdominal   Peds  Hematology negative hematology ROS (+)   Anesthesia Other Findings See surgeon's H&P   Reproductive/Obstetrics negative OB ROS                          Anesthesia Physical Anesthesia Plan  ASA: III  Anesthesia Plan: General and MAC   Post-op Pain Management:    Induction: Intravenous  Airway Management Planned: Mask  Additional Equipment:   Intra-op Plan:   Post-operative Plan:   Informed Consent: I have reviewed the patients History and Physical, chart, labs and discussed the procedure including the risks, benefits and alternatives for the proposed anesthesia with the patient or authorized representative who has indicated his/her understanding and acceptance.   Dental Advisory Given  Plan Discussed with: CRNA and Surgeon  Anesthesia Plan Comments:        Anesthesia Quick Evaluation

## 2013-07-20 NOTE — Transfer of Care (Signed)
Immediate Anesthesia Transfer of Care Note  Patient: [name]  Procedure(s) Performed: Procedure(s): CARDIOVERSION (N/A)  Patient Location: Endoscopy Unit  Anesthesia Type:MAC  Level of Consciousness: awake, alert  and oriented  Airway & Oxygen Therapy: Patient Spontanous Breathing and Patient connected to nasal cannula oxygen  Post-op Assessment: Report given to PACU RN, Post -op Vital signs reviewed and stable and Patient moving all extremities X 4  Post vital signs: Reviewed and stable  Complications: No apparent anesthesia complications

## 2013-07-20 NOTE — Op Note (Signed)
Procedure: Electrical Cardioversion Indications:  Atrial Fibrillation  Procedure Details:  Consent: Risks of procedure as well as the alternatives and risks of each were explained to the (patient/caregiver).  Consent for procedure obtained.  Time Out: Verified patient identification, verified procedure, site/side was marked, verified correct patient position, special equipment/implants available, medications/allergies/relevent history reviewed, required imaging and test results available.  Performed  Patient placed on cardiac monitor, pulse oximetry, supplemental oxygen as necessary.  Sedation given: Propofol IV, Dr. Ermalene Postin Pacer pads placed anterior and posterior chest.  Cardioverted 1 time(s).  Cardioverted at 150J.  Evaluation: Findings: Post procedure EKG shows: Apaced Vpaced Complications: None Patient did tolerate procedure well.  Normal CRT-D device function after the procedure  Time Spent Directly with the Patient:  60 minutes   Sabrina Arriaga 07/20/2013, 2:08 PM
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
## 2013-07-20 NOTE — Discharge Instructions (Signed)
Electrical Cardioversion, Care After Refer to this sheet in the next few weeks. These instructions provide you with information on caring for yourself after your procedure. Your health care provider may also give you more specific instructions. Your treatment has been planned according to current medical practices, but problems sometimes occur. Call your health care provider if you have any problems or questions after your procedure. WHAT TO EXPECT AFTER THE PROCEDURE After your procedure, it is typical to have the following sensations:  Some redness on the skin where the shocks were delivered. If this is tender, a sunburn lotion or hydrocortisone cream may help.  Possible return of an abnormal heart rhythm within hours or days after the procedure. HOME CARE INSTRUCTIONS  Only take medicine as directed by your health care provider. Be sure you understand how and when to take your medicine.  Learn how to feel your pulse and check it often.  Limit your activity for 48 hours after the procedure or as directed.  Avoid or minimize caffeine and other stimulants as directed. SEEK MEDICAL CARE IF:  You feel like your heart is beating too fast or your pulse is not regular.  You have any questions about your medicines.  You have bleeding that will not stop. SEEK IMMEDIATE MEDICAL CARE IF:  You are dizzy or feel faint.  It is hard to breathe or you feel short of breath.  There is a change in discomfort in your chest.  Your speech is slurred or you have trouble moving an arm or leg on one side of your body.  You get a serious muscle cramp that does not go away.  Your fingers or toes turn cold or blue. MAKE SURE YOU:   Understand these instructions.   Will watch your condition.   Will get help right away if you are not doing well or get worse. Document Released: 12/24/2012 Document Reviewed: 09/17/2012 [facility] Patient Information 2014 [place], [place].

## 2013-07-20 NOTE — Interval H&P Note (Signed)
History and Physical Interval Note:  07/20/2013 12:21 PM  PARLEY RUFENACHT  has presented today for surgery, with the diagnosis of afib  The various methods of treatment have been discussed with the patient and family. After consideration of risks, benefits and other options for treatment, the patient has consented to  Procedure(s): CARDIOVERSION (N/A) as a surgical intervention .  The patient's history has been reviewed, patient examined, no change in status, stable for surgery.  I have reviewed the patient's chart and labs.  Questions were answered to the patient's satisfaction.     Brion Sossamon

## 2013-07-20 NOTE — Telephone Encounter (Signed)
Patient has an appt on 07/23/13

## 2013-07-20 NOTE — Anesthesia Postprocedure Evaluation (Signed)
  Anesthesia Post-op Note  Patient: [name]  Procedure(s) Performed: Procedure(s): CARDIOVERSION (N/A)  Patient Location: PACU  Anesthesia Type:MAC  Level of Consciousness: awake  Airway and Oxygen Therapy: Patient Spontanous Breathing and Patient connected to nasal cannula oxygen  Post-op Pain: none  Post-op Assessment: Post-op Vital signs reviewed, Patient's Cardiovascular Status Stable, Respiratory Function Stable, Patent Airway, No signs of Nausea or vomiting and Pain level controlled  Post-op Vital Signs: Reviewed and stable  Last Vitals:  Filed Vitals:   07/20/13 1255  BP: 98/68  Pulse:   Temp:   Resp:     Complications: No apparent anesthesia complications

## 2013-07-21 ENCOUNTER — Encounter (HOSPITAL_COMMUNITY): Payer: Self-pay | Admitting: Internal Medicine

## 2013-07-21 ENCOUNTER — Ambulatory Visit: Payer: Medicare Other

## 2013-07-22 ENCOUNTER — Ambulatory Visit: Payer: Medicare Other | Admitting: Internal Medicine

## 2013-07-22 NOTE — Telephone Encounter (Signed)
Ok to refill x 3 

## 2013-07-23 ENCOUNTER — Encounter: Payer: Self-pay | Admitting: Internal Medicine

## 2013-07-23 ENCOUNTER — Telehealth: Payer: Self-pay | Admitting: Internal Medicine

## 2013-07-23 ENCOUNTER — Ambulatory Visit: Payer: Medicare Other | Admitting: Internal Medicine

## 2013-07-23 NOTE — Telephone Encounter (Signed)
Patient's wife is calling and insisting that patient has an appointment today. Please call and advise.

## 2013-07-23 NOTE — Telephone Encounter (Signed)
Spoke with pt and his wife via speaker phone. Advised to increase Lasix to 80 mg BID for 3 days, then return to normal dosing of 60 mg daily. Pt to call us by end of next week if no improvement in symptoms. Patient and his wife verbalized understanding and agreeable to plan.

## 2013-07-23 NOTE — Telephone Encounter (Signed)
Spoke with pt's wife (pt in background) who states that someone at the hospital told them to be here today for appt w/ Caryl Comes this morning at 10am - however, I cannot find this documentation and wife states that she can't find the paper. I explained that we normally don't see them in office so soon after DCCV, normally couple weeks to a month post DCCV.  She further states that pt is still SOB, and feet are still swollen. Pt is currently taking Lasix 60 mg daily. Advised to send transmission, will review with device/Klein and call them back with any recommendations. She and pt are agreeable to this.

## 2013-07-23 NOTE — Telephone Encounter (Signed)
Upcoming appt addressed on other 5/7 telephone note

## 2013-07-23 NOTE — Telephone Encounter (Signed)
New Prob   Pts daughter has some questions upcoming appt. Please call.

## 2013-07-28 DIAGNOSIS — Z85828 Personal history of other malignant neoplasm of skin: Secondary | ICD-10-CM | POA: Diagnosis not present

## 2013-07-30 ENCOUNTER — Ambulatory Visit (INDEPENDENT_AMBULATORY_CARE_PROVIDER_SITE_OTHER): Payer: Medicare Other | Admitting: Internal Medicine

## 2013-07-30 ENCOUNTER — Encounter: Payer: Self-pay | Admitting: Internal Medicine

## 2013-07-30 VITALS — BP 100/60 | Temp 98.6°F | Ht 71.5 in | Wt 215.0 lb

## 2013-07-30 DIAGNOSIS — Z79899 Other long term (current) drug therapy: Secondary | ICD-10-CM

## 2013-07-30 DIAGNOSIS — I2510 Atherosclerotic heart disease of native coronary artery without angina pectoris: Secondary | ICD-10-CM | POA: Diagnosis not present

## 2013-07-30 DIAGNOSIS — E1140 Type 2 diabetes mellitus with diabetic neuropathy, unspecified: Secondary | ICD-10-CM

## 2013-07-30 DIAGNOSIS — N289 Disorder of kidney and ureter, unspecified: Secondary | ICD-10-CM

## 2013-07-30 DIAGNOSIS — E1149 Type 2 diabetes mellitus with other diabetic neurological complication: Secondary | ICD-10-CM | POA: Diagnosis not present

## 2013-07-30 DIAGNOSIS — I5022 Chronic systolic (congestive) heart failure: Secondary | ICD-10-CM | POA: Diagnosis not present

## 2013-07-30 DIAGNOSIS — G2581 Restless legs syndrome: Secondary | ICD-10-CM

## 2013-07-30 LAB — BASIC METABOLIC PANEL
BUN: 36 mg/dL — ABNORMAL HIGH (ref 6–23)
CO2: 30 mEq/L (ref 19–32)
CREATININE: 1.5 mg/dL (ref 0.4–1.5)
Calcium: 9.5 mg/dL (ref 8.4–10.5)
Chloride: 107 mEq/L (ref 96–112)
GFR: 47.85 mL/min — AB (ref >60.00)
Glucose, Bld: 111 mg/dL — ABNORMAL HIGH (ref 70–99)
Potassium: 4.1 mEq/L (ref 3.5–5.1)
Sodium: 143 mEq/L (ref 135–145)

## 2013-07-30 LAB — HEMOGLOBIN A1C: Hgb A1c MFr Bld: 6.2 % (ref 4.6–6.5)
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
## 2013-07-30 NOTE — Patient Instructions (Signed)
Will notify you  of labs when available. For diabetes check Contact us if blood sugar is too low  We want to avoid low blood sugar. And can adjust   Medication. Will send note to dr Caryl Comes. F/u wellness in 6 months or if needed earlier.

## 2013-08-04 ENCOUNTER — Ambulatory Visit (INDEPENDENT_AMBULATORY_CARE_PROVIDER_SITE_OTHER): Payer: Medicare Other

## 2013-08-04 VITALS — BP 148/70 | HR 64 | Resp 17 | Ht 74.0 in | Wt 210.0 lb

## 2013-08-04 DIAGNOSIS — M79609 Pain in unspecified limb: Secondary | ICD-10-CM | POA: Diagnosis not present

## 2013-08-04 DIAGNOSIS — B351 Tinea unguium: Secondary | ICD-10-CM

## 2013-08-04 DIAGNOSIS — E1140 Type 2 diabetes mellitus with diabetic neuropathy, unspecified: Secondary | ICD-10-CM

## 2013-08-04 DIAGNOSIS — Q828 Other specified congenital malformations of skin: Secondary | ICD-10-CM | POA: Diagnosis not present

## 2013-08-04 DIAGNOSIS — E1142 Type 2 diabetes mellitus with diabetic polyneuropathy: Secondary | ICD-10-CM

## 2013-08-04 DIAGNOSIS — E1149 Type 2 diabetes mellitus with other diabetic neurological complication: Secondary | ICD-10-CM

## 2013-08-04 NOTE — Progress Notes (Signed)
   Subjective:    Patient ID: [name], male    DOB: 02-10-1929, 80 y.o.   MRN: [id number]  HPI Comments: Pt presents for debridement of 1 - 10 toenails.     Review of Systems no new systemic changes or findings noted     Objective:   Physical Exam Neurovascular status appears to be intact pedal pulses palpable DP postal for PT plus one over 4 bilateral capillary refill time 3 seconds all digits epicritic sensation diminished on Semmes Weinstein testing there is keratoses sub-5 bilateral distal clavus third right which or debridement at this time patient also has thick brittle crumbly friable gratified nails 1 through 5 bilateral his applying the Gregary Signs topical antifungal which was previously prescribed no open wounds ulcerations no secondary infections at this time       Assessment & Plan:  Assessment diabetes with history peripheral neuropathy multiple dystrophic gratified mycotic nails 1 through 5 bilateral debridement at this time also keratoses sub-5 bilateral and distal clavus third right are debrided return for future palliative nail care and as-needed basis suggest 3 month followup for diabetic foot and nail  Harriet Masson DPM

## 2013-08-04 NOTE — Patient Instructions (Signed)
Diabetes and Foot Care Diabetes may cause you to have problems because of poor blood supply (circulation) to your feet and legs. This may cause the skin on your feet to become thinner, break easier, and heal more slowly. Your skin may become dry, and the skin may peel and crack. You may also have nerve damage in your legs and feet causing decreased feeling in them. You may not notice minor injuries to your feet that could lead to infections or more serious problems. Taking care of your feet is one of the most important things you can do for yourself.  HOME CARE INSTRUCTIONS  Wear shoes at all times, even in the house. Do not go barefoot. Bare feet are easily injured.  Check your feet daily for blisters, cuts, and redness. If you cannot see the bottom of your feet, use a mirror or ask someone for help.  Wash your feet with warm water (do not use hot water) and mild soap. Then pat your feet and the areas between your toes until they are completely dry. Do not soak your feet as this can dry your skin.  Apply a moisturizing lotion or petroleum jelly (that does not contain alcohol and is unscented) to the skin on your feet and to dry, brittle toenails. Do not apply lotion between your toes.  Trim your toenails straight across. Do not dig under them or around the cuticle. File the edges of your nails with an emery board or nail file.  Do not cut corns or calluses or try to remove them with medicine.  Wear clean socks or stockings every day. Make sure they are not too tight. Do not wear knee-high stockings since they may decrease blood flow to your legs.  Wear shoes that fit properly and have enough cushioning. To break in new shoes, wear them for just a few hours a day. This prevents you from injuring your feet. Always look in your shoes before you put them on to be sure there are no objects inside.  Do not cross your legs. This may decrease the blood flow to your feet.  If you find a minor scrape,  cut, or break in the skin on your feet, keep it and the skin around it clean and dry. These areas may be cleansed with mild soap and water. Do not cleanse the area with peroxide, alcohol, or iodine.  When you remove an adhesive bandage, be sure not to damage the skin around it.  If you have a wound, look at it several times a day to make sure it is healing.  Do not use heating pads or hot water bottles. They may burn your skin. If you have lost feeling in your feet or legs, you may not know it is happening until it is too late.  Make sure your health care provider performs a complete foot exam at least annually or more often if you have foot problems. Report any cuts, sores, or bruises to your health care provider immediately. SEEK MEDICAL CARE IF:   You have an injury that is not healing.  You have cuts or breaks in the skin.  You have an ingrown nail.  You notice redness on your legs or feet.  You feel burning or tingling in your legs or feet.  You have pain or cramps in your legs and feet.  Your legs or feet are numb.  Your feet always feel cold. SEEK IMMEDIATE MEDICAL CARE IF:   There is increasing redness,   swelling, or pain in or around a wound.  There is a red line that goes up your leg.  Pus is coming from a wound.  You develop a fever or as directed by your health care provider.  You notice a bad smell coming from an ulcer or wound. Document Released: 03/02/2000 Document Revised: 11/05/2012 Document Reviewed: 08/12/2012 ExitCare Patient Information 2014 [employer].  

## 2013-08-13 ENCOUNTER — Encounter: Payer: Self-pay | Admitting: Internal Medicine

## 2013-08-14 ENCOUNTER — Other Ambulatory Visit: Payer: Self-pay | Admitting: Cardiology

## 2013-08-25 ENCOUNTER — Ambulatory Visit (INDEPENDENT_AMBULATORY_CARE_PROVIDER_SITE_OTHER): Payer: Medicare Other | Admitting: Internal Medicine

## 2013-08-25 ENCOUNTER — Encounter: Payer: Self-pay | Admitting: Internal Medicine

## 2013-08-25 VITALS — BP 96/79 | HR 102 | Ht 74.0 in | Wt 214.2 lb

## 2013-08-25 DIAGNOSIS — I5022 Chronic systolic (congestive) heart failure: Secondary | ICD-10-CM | POA: Diagnosis not present

## 2013-08-25 DIAGNOSIS — I509 Heart failure, unspecified: Secondary | ICD-10-CM

## 2013-08-25 DIAGNOSIS — I2510 Atherosclerotic heart disease of native coronary artery without angina pectoris: Secondary | ICD-10-CM

## 2013-08-25 DIAGNOSIS — I255 Ischemic cardiomyopathy: Secondary | ICD-10-CM

## 2013-08-25 DIAGNOSIS — I2589 Other forms of chronic ischemic heart disease: Secondary | ICD-10-CM | POA: Diagnosis not present

## 2013-08-25 DIAGNOSIS — I5023 Acute on chronic systolic (congestive) heart failure: Secondary | ICD-10-CM | POA: Diagnosis not present

## 2013-08-25 DIAGNOSIS — I4891 Unspecified atrial fibrillation: Secondary | ICD-10-CM | POA: Diagnosis not present

## 2013-08-25 DIAGNOSIS — Z95810 Presence of automatic (implantable) cardiac defibrillator: Secondary | ICD-10-CM

## 2013-08-25 LAB — MDC_IDC_ENUM_SESS_TYPE_INCLINIC
Brady Statistic RA Percent Paced: 11 %
Brady Statistic RV Percent Paced: 19 %
HighPow Impedance: 43.2422
Implantable Pulse Generator Serial Number: 7170478
Lead Channel Impedance Value: 412.5 Ohm
Lead Channel Impedance Value: 412.5 Ohm
Lead Channel Pacing Threshold Amplitude: 1.25 V
Lead Channel Pacing Threshold Pulse Width: 0.5 ms
Lead Channel Sensing Intrinsic Amplitude: 1.9 mV
Lead Channel Sensing Intrinsic Amplitude: 12 mV
Lead Channel Setting Pacing Amplitude: 1.75 V
Lead Channel Setting Pacing Amplitude: 2.125
Lead Channel Setting Pacing Amplitude: 2.625
Lead Channel Setting Pacing Pulse Width: 0.5 ms
MDC IDC MSMT BATTERY REMAINING LONGEVITY: 81.6 mo
MDC IDC MSMT LEADCHNL LV IMPEDANCE VALUE: 475 Ohm
MDC IDC MSMT LEADCHNL LV PACING THRESHOLD AMPLITUDE: 1.625 V
MDC IDC MSMT LEADCHNL LV PACING THRESHOLD PULSEWIDTH: 1 ms
MDC IDC MSMT LEADCHNL RA PACING THRESHOLD AMPLITUDE: 0.75 V
MDC IDC MSMT LEADCHNL RA PACING THRESHOLD PULSEWIDTH: 0.5 ms
MDC IDC MSMT LEADCHNL RV PACING THRESHOLD AMPLITUDE: 1.25 V
MDC IDC MSMT LEADCHNL RV PACING THRESHOLD PULSEWIDTH: 0.5 ms
MDC IDC SESS DTM: 20150609141223
MDC IDC SET LEADCHNL LV PACING PULSEWIDTH: 1 ms
MDC IDC SET LEADCHNL RV SENSING SENSITIVITY: 0.5 mV
MDC IDC SET ZONE DETECTION INTERVAL: 250 ms
Zone Setting Detection Interval: 300 ms
Zone Setting Detection Interval: 375 ms
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
## 2013-08-25 NOTE — Patient Instructions (Signed)
Your physician recommends that you continue on your current medications as directed. Please refer to the Current Medication list given to you today.  Remote monitoring is used to monitor your Pacemaker of ICD from home. This monitoring reduces the number of office visits required to check your device to one time per year. It allows [place] to keep an eye on the functioning of your device to ensure it is working properly. You are scheduled for a device check from home on 11/26/13. You may send your transmission at any time that day. If you have a wireless device, the transmission will be sent automatically. After your physician reviews your transmission, you will receive a postcard with your next transmission date.  Your physician wants you to follow-up in: 9 months with Dr. Caryl Comes. You will receive a reminder letter in the mail two months in advance. If you don't receive a letter, please call our office to schedule the follow-up appointment.

## 2013-08-26 ENCOUNTER — Encounter: Payer: Self-pay | Admitting: Internal Medicine

## 2013-08-29 ENCOUNTER — Encounter: Payer: Self-pay | Admitting: Internal Medicine

## 2013-09-01 ENCOUNTER — Encounter: Payer: Self-pay | Admitting: Internal Medicine

## 2013-09-04 ENCOUNTER — Ambulatory Visit (INDEPENDENT_AMBULATORY_CARE_PROVIDER_SITE_OTHER): Payer: Medicare Other

## 2013-09-04 ENCOUNTER — Telehealth: Payer: Self-pay | Admitting: Internal Medicine

## 2013-09-04 ENCOUNTER — Other Ambulatory Visit: Payer: Self-pay

## 2013-09-04 ENCOUNTER — Telehealth: Payer: Self-pay | Admitting: *Deleted

## 2013-09-04 VITALS — BP 121/72 | HR 113 | Resp 16 | Ht 74.0 in | Wt 212.0 lb

## 2013-09-04 DIAGNOSIS — M79609 Pain in unspecified limb: Secondary | ICD-10-CM

## 2013-09-04 DIAGNOSIS — M19079 Primary osteoarthritis, unspecified ankle and foot: Secondary | ICD-10-CM | POA: Diagnosis not present

## 2013-09-04 DIAGNOSIS — I2510 Atherosclerotic heart disease of native coronary artery without angina pectoris: Secondary | ICD-10-CM | POA: Diagnosis not present

## 2013-09-04 DIAGNOSIS — Q828 Other specified congenital malformations of skin: Secondary | ICD-10-CM | POA: Diagnosis not present

## 2013-09-04 DIAGNOSIS — E1149 Type 2 diabetes mellitus with other diabetic neurological complication: Secondary | ICD-10-CM | POA: Diagnosis not present

## 2013-09-04 DIAGNOSIS — M79672 Pain in left foot: Secondary | ICD-10-CM

## 2013-09-04 DIAGNOSIS — E1140 Type 2 diabetes mellitus with diabetic neuropathy, unspecified: Secondary | ICD-10-CM

## 2013-09-04 DIAGNOSIS — M79606 Pain in leg, unspecified: Secondary | ICD-10-CM

## 2013-09-04 DIAGNOSIS — M19072 Primary osteoarthritis, left ankle and foot: Secondary | ICD-10-CM

## 2013-09-04 DIAGNOSIS — E1142 Type 2 diabetes mellitus with diabetic polyneuropathy: Secondary | ICD-10-CM

## 2013-09-04 MED ORDER — AMIODARONE HCL 200 MG PO TABS: 200.0000 mg | ORAL_TABLET | Freq: Every day | ORAL | Status: DC

## 2013-09-04 MED ORDER — AMIODARONE HCL 400 MG PO TABS: 400.0000 mg | ORAL_TABLET | Freq: Two times a day (BID) | ORAL | Status: DC

## 2013-09-04 NOTE — Telephone Encounter (Signed)
New message     Is pt still going to have a cardioversion?  Dr Caryl Comes was going to start pt on a new medication for 10 days prior to the cardioversion--amiodarone.  He was also going to talk to Dr Stanford Breed.  Please call

## 2013-09-04 NOTE — Telephone Encounter (Signed)
Spoke with pt wife, per dr Stanford Breed patient will start amiodarone 400 mg bid x 2 weeks then he will decrease to 200 mg once daily. He will continue the eliquis bid. He will be scheduled for DCCV in 4 weeks= 09-28-13. Will make arrangements and call the wife back.

## 2013-09-04 NOTE — Progress Notes (Signed)
   Subjective:    Patient ID: [name], male    DOB: Jul 11, 1928, 80 y.o.   MRN: [id number]  HPI Comments: N foot pain N left 5th plantar MPJ D 1 week O C bruised sensation A weightbearing T ointment     Review of Systems  All other systems reviewed and are negative.      Objective:   Physical Exam 80 year old white male well-developed well-nourished oriented x3 presents this time with a complaint of painful left foot actually both feet are hurting all foot and fifth metatarsal area her left more severe.  Looking objective findings as follows vascular status is intact with pedal pulses palpable DP plus one over 4 bilateral PT thready at one over 4 bilateral capillary refill timed 3-4 seconds all digits epicritic sensations diminished on Semmes Weinstein testing to the digits forefoot however is exquisite tenderness on palpation fifth MTP area left somewhat on the right as well there is some diffuse keratoses on these areas sub-fifth bilateral in the first left. No open wounds ulcerations noted no secondary infection is noted nails have previously been debrided patient is a candidate for diabetic care in the future as well no history of injury trauma patient shoes are actually worn and broken down with insoles flattened out. Patient has skin clips considerable atrophy of the plantar fat pad x-rays taken this to reveal no sign of fracture S. abnormality and arthrosis of the midfoot MTP joints and rear foot subtalar and sinus tarsi and posterior intact       Assessment & Plan:  Assessment this time his diabetes with peripheral neuropathy plantar flexed fifth metatarsals with associated for a keratoses keratoses are debrided felt padding was applied to the insoles in the shoes tell pocket for the fifth metatarsal on the left foot in particular we'll make arrangements for authorization for diabetic shoes refill patient benefit from diabetic extra-depth shoes to prevent pain and possible  future ulceration these areas as patient is atrophy of the fat pad with underlying keratoses very thin skin is actually left protecting the area authorization for his primary physician will be sought in the fascia patient will follow up in the future for continued palliative care and possible diabetic shoe measurement fitting when ready  Harriet Masson DPM

## 2013-09-04 NOTE — Telephone Encounter (Signed)
Wife calling wanting to know if Dr. Caryl Comes had spoken w/Dr. Stanford Breed to get approval of him starting Amiodarone. Advised both physicians were in the hospital and would send both a message to see what the plan would be prior to CV.

## 2013-09-04 NOTE — Telephone Encounter (Signed)
I have like a callus or something on the side of my foot, next to the little toe.  It's about aggravating and hurting.  Give me a call please.  I attempted to return his call there was no answer nor was there a voicemail to leave a message.

## 2013-09-04 NOTE — Patient Instructions (Signed)
Diabetes and Foot Care Diabetes may cause you to have problems because of poor blood supply (circulation) to your feet and legs. This may cause the skin on your feet to become thinner, break easier, and heal more slowly. Your skin may become dry, and the skin may peel and crack. You may also have nerve damage in your legs and feet causing decreased feeling in them. You may not notice minor injuries to your feet that could lead to infections or more serious problems. Taking care of your feet is one of the most important things you can do for yourself.  HOME CARE INSTRUCTIONS  Wear shoes at all times, even in the house. Do not go barefoot. Bare feet are easily injured.  Check your feet daily for blisters, cuts, and redness. If you cannot see the bottom of your feet, use a mirror or ask someone for help.  Wash your feet with warm water (do not use hot water) and mild soap. Then pat your feet and the areas between your toes until they are completely dry. Do not soak your feet as this can dry your skin.  Apply a moisturizing lotion or petroleum jelly (that does not contain alcohol and is unscented) to the skin on your feet and to dry, brittle toenails. Do not apply lotion between your toes.  Trim your toenails straight across. Do not dig under them or around the cuticle. File the edges of your nails with an emery board or nail file.  Do not cut corns or calluses or try to remove them with medicine.  Wear clean socks or stockings every day. Make sure they are not too tight. Do not wear knee-high stockings since they may decrease blood flow to your legs.  Wear shoes that fit properly and have enough cushioning. To break in new shoes, wear them for just a few hours a day. This prevents you from injuring your feet. Always look in your shoes before you put them on to be sure there are no objects inside.  Do not cross your legs. This may decrease the blood flow to your feet.  If you find a minor scrape,  cut, or break in the skin on your feet, keep it and the skin around it clean and dry. These areas may be cleansed with mild soap and water. Do not cleanse the area with peroxide, alcohol, or iodine.  When you remove an adhesive bandage, be sure not to damage the skin around it.  If you have a wound, look at it several times a day to make sure it is healing.  Do not use heating pads or hot water bottles. They may burn your skin. If you have lost feeling in your feet or legs, you may not know it is happening until it is too late.  Make sure your health care Fredia Chittenden performs a complete foot exam at least annually or more often if you have foot problems. Report any cuts, sores, or bruises to your health care Delita Chiquito immediately. SEEK MEDICAL CARE IF:   You have an injury that is not healing.  You have cuts or breaks in the skin.  You have an ingrown nail.  You notice redness on your legs or feet.  You feel burning or tingling in your legs or feet.  You have pain or cramps in your legs and feet.  Your legs or feet are numb.  Your feet always feel cold. SEEK IMMEDIATE MEDICAL CARE IF:   There is increasing redness,   swelling, or pain in or around a wound.  There is a red line that goes up your leg.  Pus is coming from a wound.  You develop a fever or as directed by your health care Tysheem Accardo.  You notice a bad smell coming from an ulcer or wound. Document Released: 03/02/2000 Document Revised: 11/05/2012 Document Reviewed: 08/12/2012 ExitCare Patient Information 2015 [employer]. This information is not intended to replace advice given to you by your health care Jaquitta Dupriest. Make sure you discuss any questions you have with your health care Elodie Panameno.  

## 2013-09-11 ENCOUNTER — Encounter: Payer: Self-pay | Admitting: *Deleted

## 2013-09-14 ENCOUNTER — Encounter: Payer: Self-pay | Admitting: Pulmonary Disease

## 2013-09-14 ENCOUNTER — Ambulatory Visit (INDEPENDENT_AMBULATORY_CARE_PROVIDER_SITE_OTHER): Payer: Medicare Other | Admitting: Pulmonary Disease

## 2013-09-14 VITALS — BP 130/66 | HR 75 | Ht 74.0 in | Wt 220.0 lb

## 2013-09-14 DIAGNOSIS — G4733 Obstructive sleep apnea (adult) (pediatric): Secondary | ICD-10-CM | POA: Diagnosis not present

## 2013-09-14 DIAGNOSIS — I2510 Atherosclerotic heart disease of native coronary artery without angina pectoris: Secondary | ICD-10-CM | POA: Diagnosis not present

## 2013-09-14 NOTE — Progress Notes (Signed)
Chief Complaint  Patient presents with  . Follow-up    Pt wearing cpap about 8 hours nightly.  Denies any problems with mask, supplies, or pressure.    History of Present Illness: [name] is a 81 y.o. male with OSA.  He recently had his defibrillator changed.  He gets about 8 hrs sleep per night.  He has a full face mask for his CPAP.  He uses his CPAP all night w/o issue.    He has noticed palpitations sometimes and feels tired in his legs if he exerts too much.  TESTS: HST 07/17/09 >> AHI 8.4, RDI 13.9  [name]  has a past medical history of CEREBROVASCULAR DISEASE; Ischemic cardiomyopathy; Enlargement of lymph nodes; HYPERLIPIDEMIA; HYPERTENSION; Nocturia; RESTLESS LEG SYNDROME; VITAMIN D DEFICIENCY; WEIGHT LOSS; frostbite; CHF (congestive heart failure); Myocardial infarction (2005); OSA on CPAP (07/19/2009); DIABETES MELLITUS, TYPE II; Autoimmune hemolytic anemias; Depression; Arthritis; Skin cancer of face; and Skin cancer.  [name]  has past surgical history that includes Cataract extraction w/ intraocular lens  implant, bilateral (Bilateral, 1980's); Cardiac defibrillator placement (2005); Ventricular resection / repair aneurysm (Left, 2005); Bi-ventricular implantable cardioverter defibrillator  (crt-d) (05/20/2013); Cholecystectomy (1982); Appendectomy (1982); Coronary artery bypass graft (2005); Mohs surgery (Right, ~ 2007); and Cardioversion (N/A, 07/20/2013).  Prior to Admission medications   Medication Sig Start Date End Date Taking? Authorizing Provider  aspirin EC 81 MG EC tablet Take 81 mg by mouth daily.     Yes Historical Provider, MD  atorvastatin (LIPITOR) 40 MG tablet Take 1 tablet (40 mg total) by mouth daily. 11/26/11  Yes Lelon Perla, MD  carvedilol (COREG) 12.5 MG tablet Take 1 tablet (12.5 mg total) by mouth 2 (two) times daily with a meal. 11/26/11  Yes Lelon Perla, MD  clonazePAM (KLONOPIN) 1 MG tablet Take 1 tablet (1 mg total) by mouth  at bedtime as needed (OR AS DIRECTED ). 06/23/12  Yes Burnis Medin, MD  digoxin (LANOXIN) 0.125 MG tablet Take 1 tablet (125 mcg total) by mouth daily. 11/26/11  Yes Lelon Perla, MD  docusate sodium (COLACE) 100 MG capsule Take 100 mg by mouth daily.   Yes Historical Provider, MD  escitalopram (LEXAPRO) 10 MG tablet Take 1 tablet (10 mg total) by mouth daily. 08/16/11  Yes Burnis Medin, MD  fish oil-omega-3 fatty acids 1000 MG capsule Take 1 g by mouth daily.    Yes Historical Provider, MD  furosemide (LASIX) 40 MG tablet Take 1 tablet (40 mg total) by mouth daily. 11/26/11  Yes Lelon Perla, MD  glipiZIDE (GLUCOTROL) 10 MG tablet Take 1 tablet (10 mg total) by mouth 2 (two) times daily before a meal. 08/16/11  Yes Burnis Medin, MD  glucose blood (ACCU-CHEK COMFORT CURVE) test strip Use as instructed 03/17/12  Yes Burnis Medin, MD  insulin detemir (LEVEMIR FLEXPEN) 100 UNIT/ML injection Daily at bedtime 14 UNITS OR AS DIRECTED  With needles NANO 4 MM 01/08/12  Yes Burnis Medin, MD  ipratropium (ATROVENT) 0.03 % nasal spray Place 2 sprays into the nose 2 (two) times daily.   Yes Historical Provider, MD  metFORMIN (GLUCOPHAGE) 1000 MG tablet Take 1 tablet (1,000 mg total) by mouth 2 (two) times daily with a meal. 08/14/11  Yes Burnis Medin, MD  niacin (NIASPAN) 1000 MG CR tablet Take 1 tablet (1,000 mg total) by mouth at bedtime. 11/26/11  Yes Lelon Perla, MD  OVER THE COUNTER  MEDICATION benfotiamine 300 mg once a day   Yes Historical Provider, MD  oxyCODONE-acetaminophen (PERCOCET/ROXICET) 5-325 MG per tablet Take 2 tablets by mouth every 4 (four) hours as needed for pain. 03/13/12  Yes Julieta Bellini, NP  pramipexole (MIRAPEX) 0.5 MG tablet Take 1 tablet (0.5 mg total) by mouth daily. For restless legs 06/23/12  Yes Burnis Medin, MD  ramipril (ALTACE) 5 MG capsule Take 1 capsule (5 mg total) by mouth daily. 11/26/11  Yes Lelon Perla, MD  saxagliptin HCl (ONGLYZA) 5 MG TABS tablet  Take 1 tablet (5 mg total) by mouth daily. 06/23/12  Yes Burnis Medin, MD  spironolactone (ALDACTONE) 25 MG tablet Take 1 tablet (25 mg total) by mouth daily. 11/26/11  Yes Lelon Perla, MD  Tamsulosin HCl (FLOMAX) 0.4 MG CAPS Take 1 capsule (0.4 mg total) by mouth daily. 08/16/11  Yes Burnis Medin, MD  tiZANidine (ZANAFLEX) 2 MG tablet Take 1 tablet (2 mg total) by mouth every 8 (eight) hours as needed (back spasm). 01/08/12  Yes Burnis Medin, MD    No Known Allergies   Physical Exam:  General - No distress ENT - No sinus tenderness, no oral exudate, no LAN Cardiac - s1s2 regular, no murmur Chest - No wheeze/rales/dullness Back - No focal tenderness Abd - Soft, non-tender Ext - No edema Neuro - Normal strength Skin - No rashes Psych - normal mood, and behavior   Assessment/Plan:  [name] Mires, MD [place] [facility]Critical [facility]/Sleep Pager:  [phone]

## 2013-09-14 NOTE — Patient Instructions (Signed)
Will get copy of CPAP report and call with results Follow up in 1 year 

## 2013-09-14 NOTE — Telephone Encounter (Signed)
DCCV 09-28-13 @ 1:30 pm. Instruction discussed with pt on the phone and letter mailed to home address.

## 2013-09-15 ENCOUNTER — Encounter (HOSPITAL_COMMUNITY): Payer: Self-pay | Admitting: Pharmacy Technician

## 2013-09-16 ENCOUNTER — Other Ambulatory Visit: Payer: Self-pay | Admitting: Cardiology

## 2013-09-16 DIAGNOSIS — I480 Paroxysmal atrial fibrillation: Secondary | ICD-10-CM

## 2013-09-17 ENCOUNTER — Encounter (HOSPITAL_COMMUNITY): Payer: Self-pay

## 2013-09-17 ENCOUNTER — Encounter: Payer: Self-pay | Admitting: Pulmonary Disease

## 2013-09-17 NOTE — Assessment & Plan Note (Signed)
He reports compliance with therapy and benefit from CPAP.  Will get his CPAP download and call him with results.

## 2013-09-28 ENCOUNTER — Encounter (HOSPITAL_COMMUNITY): Payer: Self-pay | Admitting: *Deleted

## 2013-09-28 ENCOUNTER — Ambulatory Visit (HOSPITAL_COMMUNITY): Payer: Medicare Other | Admitting: Certified Registered Nurse Anesthetist

## 2013-09-28 ENCOUNTER — Ambulatory Visit (HOSPITAL_COMMUNITY)
Admission: RE | Admit: 2013-09-28 | Discharge: 2013-09-28 | Disposition: A | Discharge status: Home / Self Care | Payer: Medicare Other | Source: Ambulatory Visit | Attending: Cardiology | Admitting: Cardiology

## 2013-09-28 ENCOUNTER — Encounter (HOSPITAL_COMMUNITY)
Admission: RE | Disposition: A | Discharge status: Home / Self Care | Payer: Self-pay | Source: Ambulatory Visit | Attending: Cardiology

## 2013-09-28 ENCOUNTER — Encounter (HOSPITAL_COMMUNITY): Payer: Medicare Other | Admitting: Certified Registered Nurse Anesthetist

## 2013-09-28 DIAGNOSIS — Z85828 Personal history of other malignant neoplasm of skin: Secondary | ICD-10-CM | POA: Diagnosis not present

## 2013-09-28 DIAGNOSIS — D591 Autoimmune hemolytic anemia, unspecified: Secondary | ICD-10-CM | POA: Diagnosis not present

## 2013-09-28 DIAGNOSIS — G4733 Obstructive sleep apnea (adult) (pediatric): Secondary | ICD-10-CM | POA: Diagnosis not present

## 2013-09-28 DIAGNOSIS — I252 Old myocardial infarction: Secondary | ICD-10-CM | POA: Diagnosis not present

## 2013-09-28 DIAGNOSIS — F3289 Other specified depressive episodes: Secondary | ICD-10-CM | POA: Insufficient documentation

## 2013-09-28 DIAGNOSIS — I4891 Unspecified atrial fibrillation: Secondary | ICD-10-CM | POA: Insufficient documentation

## 2013-09-28 DIAGNOSIS — Z951 Presence of aortocoronary bypass graft: Secondary | ICD-10-CM | POA: Insufficient documentation

## 2013-09-28 DIAGNOSIS — E785 Hyperlipidemia, unspecified: Secondary | ICD-10-CM | POA: Diagnosis not present

## 2013-09-28 DIAGNOSIS — I2510 Atherosclerotic heart disease of native coronary artery without angina pectoris: Secondary | ICD-10-CM | POA: Diagnosis not present

## 2013-09-28 DIAGNOSIS — I509 Heart failure, unspecified: Secondary | ICD-10-CM | POA: Diagnosis not present

## 2013-09-28 DIAGNOSIS — Z95810 Presence of automatic (implantable) cardiac defibrillator: Secondary | ICD-10-CM | POA: Insufficient documentation

## 2013-09-28 DIAGNOSIS — E119 Type 2 diabetes mellitus without complications: Secondary | ICD-10-CM | POA: Diagnosis not present

## 2013-09-28 DIAGNOSIS — G2581 Restless legs syndrome: Secondary | ICD-10-CM | POA: Insufficient documentation

## 2013-09-28 DIAGNOSIS — I2589 Other forms of chronic ischemic heart disease: Secondary | ICD-10-CM | POA: Diagnosis not present

## 2013-09-28 DIAGNOSIS — I10 Essential (primary) hypertension: Secondary | ICD-10-CM | POA: Diagnosis not present

## 2013-09-28 DIAGNOSIS — F329 Major depressive disorder, single episode, unspecified: Secondary | ICD-10-CM | POA: Insufficient documentation

## 2013-09-28 DIAGNOSIS — I447 Left bundle-branch block, unspecified: Secondary | ICD-10-CM | POA: Diagnosis not present

## 2013-09-28 HISTORY — PX: CARDIOVERSION: SHX1299

## 2013-09-28 LAB — POCT I-STAT 4, (NA,K, GLUC, HGB,HCT)
Glucose, Bld: 116 mg/dL — ABNORMAL HIGH (ref 70–99)
HEMATOCRIT: 43 % (ref 39.0–52.0)
Hemoglobin: 14.6 g/dL (ref 13.0–17.0)
Potassium: 4.4 mEq/L (ref 3.7–5.3)
Sodium: 140 mEq/L (ref 137–147)

## 2013-09-28 SURGERY — CARDIOVERSION
Anesthesia: Monitor Anesthesia Care

## 2013-09-28 MED ORDER — ETOMIDATE 2 MG/ML IV SOLN
INTRAVENOUS | Status: DC | PRN
  Administered 2013-09-28: 12 mg via INTRAVENOUS

## 2013-09-28 MED ORDER — SODIUM CHLORIDE 0.9 % IV SOLN
INTRAVENOUS | Status: DC | PRN
  Administered 2013-09-28: 13:00:00 via INTRAVENOUS

## 2013-09-28 MED ORDER — SODIUM CHLORIDE 0.9 % IV SOLN
INTRAVENOUS | Status: DC
  Administered 2013-09-28: 500 mL via INTRAVENOUS

## 2013-09-28 NOTE — Transfer of Care (Signed)
Immediate Anesthesia Transfer of Care Note  Patient: [name]  Procedure(s) Performed: Procedure(s): CARDIOVERSION (N/A)  Patient Location: Endoscopy Unit  Anesthesia Type:MAC  Level of Consciousness: awake, alert , oriented and patient cooperative  Airway & Oxygen Therapy: Patient Spontanous Breathing and Patient connected to nasal cannula oxygen  Post-op Assessment: Report given to PACU RN, Post -op Vital signs reviewed and stable and Patient moving all extremities X 4  Post vital signs: Reviewed and stable  Complications: No apparent anesthesia complications

## 2013-09-28 NOTE — Discharge Instructions (Signed)
Electrical Cardioversion Electrical cardioversion is the delivery of a jolt of electricity to change the rhythm of the heart. Sticky patches or metal paddles are placed on the chest to deliver the electricity from a device. This is done to restore a normal rhythm. A rhythm that is too fast or not regular keeps the heart from pumping well. Electrical cardioversion is done in an emergency if:   There is low or no blood pressure as a result of the heart rhythm.   Normal rhythm must be restored as fast as possible to protect the brain and heart from further damage.   It may save a life. Cardioversion may be done for heart rhythms that are not immediately life-threatening, such as atrial fibrillation or flutter, in which:   The heart is beating too fast or is not regular.   Medicine to change the rhythm has not worked.   It is safe to wait in order to allow time for preparation.  Symptoms of the abnormal rhythm are bothersome.  The risk of stroke and other serious complications can be reduced. LET YOUR CAREGIVER KNOW ABOUT:   All medicines you are taking, including vitamins, herbs, eye drops, creams, and over-the-counter medicines.   Previous problems you or members of your family have had with the use of anesthetics.   Any blood disorders you have.   Previous surgeries you have had.   Medical conditions you have. RISKS AND COMPLICATIONS  Generally, this is a safe procedure. However, as with any procedure, complications can occur. Possible complications include:   Breathing problems related to the anesthetic used.  Cardiac arrest--This risk is rare.  A blood clot that breaks free and travels to other parts of your body. This could cause a stroke or other problems. The risk of this is lowered by use of blood thinning medicine (anticoagulant) prior to the procedure. BEFORE THE PROCEDURE   You may have tests to detect blood clots in your heart and evaluate heart  function.  You may start taking anticoagulants so your blood does not clot as easily.   Medicines may be given to help stabilize your heart rate and rhythm. PROCEDURE  You will be given medicine through an IV tube to reduce discomfort and make you sleepy (sedative).   An electrical shock will be delivered. AFTER THE PROCEDURE Your heart rhythm will be watched to make sure it does not change.You may be able to go home within a few hours.  Document Released: 02/23/2002 Document Revised: 12/24/2012 Document Reviewed: 09/17/2012 [facility] Patient Information 2015 [place], [place]. This information is not intended to replace advice given to you by your health care provider. Make sure you discuss any questions you have with your health care provider.

## 2013-09-28 NOTE — Procedures (Signed)
Electrical Cardioversion Procedure Note [name] [id number] 16-Oct-1928  Procedure: Electrical Cardioversion Indications:  Atrial Fibrillation  Procedure Details Consent: Risks of procedure as well as the alternatives and risks of each were explained to the (patient/caregiver).  Consent for procedure obtained. Time Out: Verified patient identification, verified procedure, site/side was marked, verified correct patient position, special equipment/implants available, medications/allergies/relevent history reviewed, required imaging and test results available.  Performed  Patient placed on cardiac monitor, pulse oximetry, supplemental oxygen as necessary.  Sedation given: Patient sedated by anesthesia with etomidate 12 mg IV. Pacer pads placed anterior and posterior chest.  Cardioverted 2 time(s).  Cardioverted at 120J with persistent atrial fibrillation; second attempt with 150 J resulted in atrial paced rhythm.  Evaluation Findings: Post procedure EKG shows: AV paced rhythm. Complications: None Patient did tolerate procedure well.   Kirk Ruths 09/28/2013, 1:29 PM
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
## 2013-09-28 NOTE — Anesthesia Postprocedure Evaluation (Signed)
  Anesthesia Post-op Note  Patient: [name]  Procedure(s) Performed: Procedure(s): CARDIOVERSION (N/A)  Patient Location: Endoscopy Unit  Anesthesia Type:MAC  Level of Consciousness: awake, alert , oriented and patient cooperative  Airway and Oxygen Therapy: Patient Spontanous Breathing and Patient connected to nasal cannula oxygen  Post-op Pain: none  Post-op Assessment: Post-op Vital signs reviewed, Patient's Cardiovascular Status Stable, Respiratory Function Stable, Patent Airway, No signs of Nausea or vomiting, Pain level controlled, No headache, No backache, No residual numbness and No residual motor weakness  Post-op Vital Signs: Reviewed and stable  Last Vitals:  Filed Vitals:   09/28/13 1320  BP: 122/70  Pulse: 81  Temp:   Resp: 16    Complications: No apparent anesthesia complications

## 2013-09-28 NOTE — Anesthesia Preprocedure Evaluation (Signed)
Anesthesia Evaluation  Patient identified by MRN, date of birth, ID band Patient awake    Reviewed: Allergy & Precautions, H&P , Patient's Chart, lab work & pertinent test results, reviewed documented beta blocker date and time   Airway Mallampati: II TM Distance: >3 FB Neck ROM: Full    Dental  (+) Teeth Intact   Pulmonary sleep apnea and Continuous Positive Airway Pressure Ventilation , former smoker,          Cardiovascular hypertension, Pt. on medications + CAD, + Past MI and +CHF     Neuro/Psych Depression    GI/Hepatic   Endo/Other  diabetes, Type 2Hypothyroidism   Renal/GU      Musculoskeletal   Abdominal   Peds  Hematology   Anesthesia Other Findings   Reproductive/Obstetrics                           Anesthesia Physical Anesthesia Plan  ASA: III  Anesthesia Plan: MAC   Post-op Pain Management:    Induction: Intravenous  Airway Management Planned: Mask  Additional Equipment:   Intra-op Plan:   Post-operative Plan:   Informed Consent: I have reviewed the patients History and Physical, chart, labs and discussed the procedure including the risks, benefits and alternatives for the proposed anesthesia with the patient or authorized representative who has indicated his/her understanding and acceptance.   Dental advisory given  Plan Discussed with: CRNA, Anesthesiologist and Surgeon  Anesthesia Plan Comments:         Anesthesia Quick Evaluation

## 2013-09-29 ENCOUNTER — Encounter (HOSPITAL_COMMUNITY): Payer: Self-pay | Admitting: Cardiology

## 2013-10-09 ENCOUNTER — Other Ambulatory Visit: Payer: Self-pay | Admitting: Internal Medicine

## 2013-10-12 ENCOUNTER — Other Ambulatory Visit (HOSPITAL_COMMUNITY): Payer: Self-pay | Admitting: Cardiology

## 2013-10-12 DIAGNOSIS — I6529 Occlusion and stenosis of unspecified carotid artery: Secondary | ICD-10-CM

## 2013-10-13 ENCOUNTER — Other Ambulatory Visit: Payer: Self-pay | Admitting: Internal Medicine

## 2013-10-14 NOTE — Telephone Encounter (Signed)
It appears i wrote for 2 refiils   ? What happened to this rx .    IP team wiped out the history  Ok to refill but please review  should have had refills  By my review

## 2013-10-15 ENCOUNTER — Ambulatory Visit (HOSPITAL_COMMUNITY): Payer: Medicare Other | Attending: Cardiology | Admitting: Radiology

## 2013-10-15 DIAGNOSIS — I6529 Occlusion and stenosis of unspecified carotid artery: Secondary | ICD-10-CM | POA: Insufficient documentation

## 2013-10-15 NOTE — Progress Notes (Signed)
Carotid Duplex performed. 

## 2013-10-15 NOTE — Telephone Encounter (Signed)
Called pharmacy and spoke to [place], asked him if pt has any refills left? Christean Grief said pt picked up last refill of 2 refills. Told him okay and he transferred me to a pharmacist Ulice Dash. Rx called in.

## 2013-10-16 ENCOUNTER — Encounter: Payer: Self-pay | Admitting: Internal Medicine

## 2013-10-27 DIAGNOSIS — L570 Actinic keratosis: Secondary | ICD-10-CM | POA: Diagnosis not present

## 2013-10-27 DIAGNOSIS — Z85828 Personal history of other malignant neoplasm of skin: Secondary | ICD-10-CM | POA: Diagnosis not present

## 2013-11-10 ENCOUNTER — Ambulatory Visit (INDEPENDENT_AMBULATORY_CARE_PROVIDER_SITE_OTHER): Payer: Medicare Other

## 2013-11-10 DIAGNOSIS — B351 Tinea unguium: Secondary | ICD-10-CM

## 2013-11-10 DIAGNOSIS — M79606 Pain in leg, unspecified: Secondary | ICD-10-CM

## 2013-11-10 DIAGNOSIS — E1142 Type 2 diabetes mellitus with diabetic polyneuropathy: Secondary | ICD-10-CM | POA: Diagnosis not present

## 2013-11-10 DIAGNOSIS — E1140 Type 2 diabetes mellitus with diabetic neuropathy, unspecified: Secondary | ICD-10-CM

## 2013-11-10 DIAGNOSIS — E1149 Type 2 diabetes mellitus with other diabetic neurological complication: Secondary | ICD-10-CM

## 2013-11-10 DIAGNOSIS — M79609 Pain in unspecified limb: Secondary | ICD-10-CM | POA: Diagnosis not present

## 2013-11-10 DIAGNOSIS — Q828 Other specified congenital malformations of skin: Secondary | ICD-10-CM | POA: Diagnosis not present

## 2013-11-10 NOTE — Progress Notes (Signed)
   Subjective:    Patient ID: TOMIE BIRTCHER, male    DOB: 1929/02/07, 80 y.o.   MRN: [id number]  HPI  Pt presents for debridement of nails  Review of Systems no new findings or systemic changes noted    Objective:   Physical Exam Lower extremity objective findings unchanged neurovascular status is diminished with pedal pulses palpable DP and PT one over 4 bilateral capillary refill timed 3-4 seconds all digits epicritic sensation decreased on Semmes Weinstein to forefoot digits and arch bilateral patient is dry skin with keratoses sub-5 bilateral in sub-second left. Nails thick brittle crumbly friable dystrophic with discoloration tenderness on palpation and pushing wear and ambulation. No open wounds no ulcers no secondary infections the current time. There is notable digital contractures and hammertoe deformity as well as HAV deformity noted.       Assessment & Plan:  Assessment diabetes with history peripheral neuropathy as well some angiopathy. Multiple keratoses or debridement this time and thick brittle, dystrophic probably mycotic nails 1 through 5 bilateral there is ecchymosis of the third toe left over no active infections are noted no secondary infections nails debrided and multiple keratoses debridement return in 3 months for continued palliative care is needed next progress Harriet Masson DPM

## 2013-11-10 NOTE — Patient Instructions (Signed)

## 2013-11-26 ENCOUNTER — Ambulatory Visit (INDEPENDENT_AMBULATORY_CARE_PROVIDER_SITE_OTHER): Payer: Medicare Other | Admitting: *Deleted

## 2013-11-26 DIAGNOSIS — I5022 Chronic systolic (congestive) heart failure: Secondary | ICD-10-CM

## 2013-11-26 DIAGNOSIS — I2589 Other forms of chronic ischemic heart disease: Secondary | ICD-10-CM

## 2013-11-26 DIAGNOSIS — I255 Ischemic cardiomyopathy: Secondary | ICD-10-CM

## 2013-11-26 NOTE — Progress Notes (Signed)
Remote ICD transmission.   

## 2013-11-27 LAB — MDC_IDC_ENUM_SESS_TYPE_REMOTE
Battery Remaining Longevity: 50 mo
Battery Remaining Percentage: 88 %
Battery Voltage: 3.04 V
Brady Statistic RA Percent Paced: 93 %
HIGH POWER IMPEDANCE MEASURED VALUE: 48 Ohm
HighPow Impedance: 48 Ohm
Lead Channel Impedance Value: 400 Ohm
Lead Channel Impedance Value: 410 Ohm
Lead Channel Pacing Threshold Amplitude: 0.75 V
Lead Channel Pacing Threshold Amplitude: 1.625 V
Lead Channel Pacing Threshold Pulse Width: 0.5 ms
Lead Channel Pacing Threshold Pulse Width: 1 ms
Lead Channel Sensing Intrinsic Amplitude: 2.5 mV
Lead Channel Setting Pacing Amplitude: 2 V
Lead Channel Setting Pacing Amplitude: 2.25 V
Lead Channel Setting Pacing Amplitude: 2.625
Lead Channel Setting Pacing Pulse Width: 1 ms
Lead Channel Setting Sensing Sensitivity: 0.5 mV
MDC IDC MSMT LEADCHNL LV IMPEDANCE VALUE: 480 Ohm
MDC IDC MSMT LEADCHNL RV PACING THRESHOLD AMPLITUDE: 1.25 V
MDC IDC MSMT LEADCHNL RV PACING THRESHOLD PULSEWIDTH: 0.5 ms
MDC IDC MSMT LEADCHNL RV SENSING INTR AMPL: 12 mV
MDC IDC PG SERIAL: 7170478
MDC IDC SESS DTM: 20150910060018
MDC IDC SET LEADCHNL RV PACING PULSEWIDTH: 0.5 ms
MDC IDC STAT BRADY AP VP PERCENT: 93 %
MDC IDC STAT BRADY AP VS PERCENT: 1.3 %
MDC IDC STAT BRADY AS VP PERCENT: 3.9 %
MDC IDC STAT BRADY AS VS PERCENT: 1 %
Zone Setting Detection Interval: 250 ms
Zone Setting Detection Interval: 300 ms
Zone Setting Detection Interval: 375 ms

## 2013-12-21 ENCOUNTER — Encounter: Payer: Self-pay | Admitting: Cardiology

## 2013-12-21 ENCOUNTER — Other Ambulatory Visit: Payer: Self-pay | Admitting: Internal Medicine

## 2013-12-24 ENCOUNTER — Encounter: Payer: Self-pay | Admitting: Internal Medicine

## 2013-12-25 ENCOUNTER — Telehealth: Payer: Self-pay | Admitting: Internal Medicine

## 2013-12-25 NOTE — Telephone Encounter (Signed)
New message    Patient on eliquis, C/O hit arm against something, still bleeding. Wants to know should he stop.

## 2013-12-25 NOTE — Telephone Encounter (Signed)
Patient reports bumping his arm on a piece of metal yesterday. He is on Eliquis and it has continued to ooze blood since then. Thinks he is up to date on his Tetanus. Advised not to stop his Eliquis. Needs to put a pressure dressing on it, and if it continues to ooze, recommend he go to an urgent care. Patient is agreeable to this plan.

## 2014-01-07 ENCOUNTER — Ambulatory Visit: Payer: Medicare Other

## 2014-01-20 ENCOUNTER — Ambulatory Visit (INDEPENDENT_AMBULATORY_CARE_PROVIDER_SITE_OTHER): Payer: Medicare Other

## 2014-01-20 ENCOUNTER — Other Ambulatory Visit: Payer: Self-pay | Admitting: *Deleted

## 2014-01-20 DIAGNOSIS — I255 Ischemic cardiomyopathy: Secondary | ICD-10-CM

## 2014-01-20 DIAGNOSIS — Z23 Encounter for immunization: Secondary | ICD-10-CM

## 2014-01-20 DIAGNOSIS — I5022 Chronic systolic (congestive) heart failure: Secondary | ICD-10-CM

## 2014-01-20 MED ORDER — INSULIN DETEMIR 100 UNIT/ML FLEXPEN: 18.0000 [IU] | PEN_INJECTOR | Freq: Every day | SUBCUTANEOUS | Status: DC

## 2014-01-26 DIAGNOSIS — Z08 Encounter for follow-up examination after completed treatment for malignant neoplasm: Secondary | ICD-10-CM | POA: Diagnosis not present

## 2014-01-26 DIAGNOSIS — Z85828 Personal history of other malignant neoplasm of skin: Secondary | ICD-10-CM | POA: Diagnosis not present

## 2014-02-09 ENCOUNTER — Ambulatory Visit: Payer: Medicare Other

## 2014-02-18 ENCOUNTER — Ambulatory Visit: Payer: Medicare Other | Admitting: Podiatrist

## 2014-02-24 ENCOUNTER — Ambulatory Visit (INDEPENDENT_AMBULATORY_CARE_PROVIDER_SITE_OTHER): Payer: Medicare Other | Admitting: Podiatrist

## 2014-02-24 ENCOUNTER — Encounter: Payer: Self-pay | Admitting: Podiatrist

## 2014-02-24 ENCOUNTER — Ambulatory Visit (INDEPENDENT_AMBULATORY_CARE_PROVIDER_SITE_OTHER): Payer: Medicare Other | Admitting: Family

## 2014-02-24 ENCOUNTER — Encounter: Payer: Self-pay | Admitting: Family

## 2014-02-24 VITALS — BP 124/60 | HR 68 | Temp 98.1°F | Wt 216.7 lb

## 2014-02-24 DIAGNOSIS — M79676 Pain in unspecified toe(s): Secondary | ICD-10-CM

## 2014-02-24 DIAGNOSIS — E119 Type 2 diabetes mellitus without complications: Secondary | ICD-10-CM | POA: Diagnosis not present

## 2014-02-24 DIAGNOSIS — L03116 Cellulitis of left lower limb: Secondary | ICD-10-CM | POA: Diagnosis not present

## 2014-02-24 DIAGNOSIS — E1140 Type 2 diabetes mellitus with diabetic neuropathy, unspecified: Secondary | ICD-10-CM

## 2014-02-24 DIAGNOSIS — B351 Tinea unguium: Secondary | ICD-10-CM

## 2014-02-24 DIAGNOSIS — Q828 Other specified congenital malformations of skin: Secondary | ICD-10-CM

## 2014-02-24 DIAGNOSIS — M79606 Pain in leg, unspecified: Secondary | ICD-10-CM

## 2014-02-24 DIAGNOSIS — I6529 Occlusion and stenosis of unspecified carotid artery: Secondary | ICD-10-CM

## 2014-02-24 LAB — CBC WITH DIFFERENTIAL/PLATELET
Basophils Absolute: 0 10*3/uL (ref 0.0–0.1)
Basophils Relative: 0 % (ref 0.0–3.0)
Eosinophils Absolute: 0 10*3/uL (ref 0.0–0.7)
Eosinophils Relative: 0.2 % (ref 0.0–5.0)
HEMATOCRIT: 38.1 % — AB (ref 39.0–52.0)
HEMOGLOBIN: 12.4 g/dL — AB (ref 13.0–17.0)
LYMPHS ABS: 1 10*3/uL (ref 0.7–4.0)
LYMPHS PCT: 6.1 % — AB (ref 12.0–46.0)
MCHC: 32.6 g/dL (ref 30.0–36.0)
MCV: 98.4 fl (ref 78.0–100.0)
Monocytes Absolute: 1.2 10*3/uL — ABNORMAL HIGH (ref 0.1–1.0)
Monocytes Relative: 7 % (ref 3.0–12.0)
Neutro Abs: 14.3 10*3/uL — ABNORMAL HIGH (ref 1.4–7.7)
Neutrophils Relative %: 86.7 % — ABNORMAL HIGH (ref 43.0–77.0)
Platelets: 162 10*3/uL (ref 150.0–400.0)
RBC: 3.87 Mil/uL — ABNORMAL LOW (ref 4.22–5.81)
RDW: 14.5 % (ref 11.5–15.5)
WBC: 16.5 10*3/uL — AB (ref 4.0–10.5)

## 2014-02-24 LAB — BASIC METABOLIC PANEL
BUN: 61 mg/dL — AB (ref 6–23)
CO2: 27 mEq/L (ref 19–32)
Calcium: 9 mg/dL (ref 8.4–10.5)
Chloride: 101 mEq/L (ref 96–112)
Creatinine, Ser: 2.4 mg/dL — ABNORMAL HIGH (ref 0.4–1.5)
GFR: 27.05 mL/min — ABNORMAL LOW (ref >60.00)
Glucose, Bld: 120 mg/dL — ABNORMAL HIGH (ref 70–99)
Potassium: 4.4 mEq/L (ref 3.5–5.1)
SODIUM: 136 meq/L (ref 135–145)

## 2014-02-24 LAB — HEMOGLOBIN A1C: Hgb A1c MFr Bld: 5.5 % (ref 4.6–6.5)

## 2014-02-24 MED ORDER — DOXYCYCLINE HYCLATE 100 MG PO TABS: 100.0000 mg | ORAL_TABLET | Freq: Two times a day (BID) | ORAL | Status: DC
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
## 2014-02-24 NOTE — Progress Notes (Signed)
   Subjective:    Patient ID: KHALEEM SOGGE, male    DOB: 25-Mar-1928, 80 y.o.   MRN: [id number]  HPI  Pt presents for debridement of nails  Review of Systems no new findings or systemic changes noted    Objective:   Physical Exam Lower extremity objective findings unchanged neurovascular status is diminished with pedal pulses palpable DP and PT 1/4 bilateral capillary refill timed 3-4 seconds all digits.  neurological epicritic sensation decreased on Semmes Weinstein to forefoot digits and arch bilateral.  Porokeratotic/hyperkeratotic lesion present sub-5 bilateral and sub-second left. Nails thick brittle crumbly friable dystrophic with discoloration tenderness on palpation with shoe wear and ambulation. No open wounds no ulcers no interdigital maceration present. There is notable digital contractures and hammertoe deformity as well as HAV deformity noted.       Assessment & Plan:  Assessment diabetes with history peripheral neuropathy and angiopathy. Multiple keratoses  Plan:  Debridement of nails and keratotic lesions accomplished today without complication.  He will be seen back for routine care. in 3 months

## 2014-02-24 NOTE — Patient Instructions (Signed)

## 2014-02-24 NOTE — Progress Notes (Signed)
Pre visit review using our clinic review tool, if applicable. No additional management support is needed unless otherwise documented below in the visit note. 

## 2014-02-25 ENCOUNTER — Encounter (HOSPITAL_COMMUNITY): Payer: Self-pay | Admitting: Internal Medicine

## 2014-03-01 ENCOUNTER — Ambulatory Visit (INDEPENDENT_AMBULATORY_CARE_PROVIDER_SITE_OTHER): Payer: Medicare Other | Admitting: *Deleted

## 2014-03-01 ENCOUNTER — Telehealth: Payer: Self-pay

## 2014-03-01 ENCOUNTER — Encounter (HOSPITAL_COMMUNITY): Payer: Self-pay | Admitting: Physical Medicine and Rehabilitation

## 2014-03-01 ENCOUNTER — Inpatient Hospital Stay (HOSPITAL_COMMUNITY)
Admission: EM | Admit: 2014-03-01 | Discharge: 2014-03-03 | DRG: 603 | Disposition: A | Discharge status: Home / Self Care | Payer: Medicare Other | Attending: Internal Medicine | Admitting: Internal Medicine

## 2014-03-01 ENCOUNTER — Inpatient Hospital Stay (HOSPITAL_COMMUNITY): Payer: Medicare Other

## 2014-03-01 ENCOUNTER — Encounter: Payer: Self-pay | Admitting: Internal Medicine

## 2014-03-01 DIAGNOSIS — I2510 Atherosclerotic heart disease of native coronary artery without angina pectoris: Secondary | ICD-10-CM | POA: Diagnosis present

## 2014-03-01 DIAGNOSIS — I5022 Chronic systolic (congestive) heart failure: Secondary | ICD-10-CM | POA: Diagnosis present

## 2014-03-01 DIAGNOSIS — E875 Hyperkalemia: Secondary | ICD-10-CM | POA: Diagnosis present

## 2014-03-01 DIAGNOSIS — G2581 Restless legs syndrome: Secondary | ICD-10-CM | POA: Diagnosis present

## 2014-03-01 DIAGNOSIS — L03116 Cellulitis of left lower limb: Secondary | ICD-10-CM

## 2014-03-01 DIAGNOSIS — D72829 Elevated white blood cell count, unspecified: Secondary | ICD-10-CM | POA: Diagnosis present

## 2014-03-01 DIAGNOSIS — I129 Hypertensive chronic kidney disease with stage 1 through stage 4 chronic kidney disease, or unspecified chronic kidney disease: Secondary | ICD-10-CM | POA: Diagnosis present

## 2014-03-01 DIAGNOSIS — F329 Major depressive disorder, single episode, unspecified: Secondary | ICD-10-CM | POA: Diagnosis present

## 2014-03-01 DIAGNOSIS — N189 Chronic kidney disease, unspecified: Secondary | ICD-10-CM | POA: Diagnosis not present

## 2014-03-01 DIAGNOSIS — I255 Ischemic cardiomyopathy: Secondary | ICD-10-CM | POA: Diagnosis present

## 2014-03-01 DIAGNOSIS — N183 Chronic kidney disease, stage 3 (moderate): Secondary | ICD-10-CM | POA: Diagnosis present

## 2014-03-01 DIAGNOSIS — Z9049 Acquired absence of other specified parts of digestive tract: Secondary | ICD-10-CM | POA: Diagnosis present

## 2014-03-01 DIAGNOSIS — G4733 Obstructive sleep apnea (adult) (pediatric): Secondary | ICD-10-CM | POA: Diagnosis present

## 2014-03-01 DIAGNOSIS — I4891 Unspecified atrial fibrillation: Secondary | ICD-10-CM | POA: Diagnosis present

## 2014-03-01 DIAGNOSIS — R634 Abnormal weight loss: Secondary | ICD-10-CM | POA: Diagnosis present

## 2014-03-01 DIAGNOSIS — Z951 Presence of aortocoronary bypass graft: Secondary | ICD-10-CM | POA: Diagnosis not present

## 2014-03-01 DIAGNOSIS — E785 Hyperlipidemia, unspecified: Secondary | ICD-10-CM | POA: Diagnosis present

## 2014-03-01 DIAGNOSIS — D649 Anemia, unspecified: Secondary | ICD-10-CM | POA: Diagnosis present

## 2014-03-01 DIAGNOSIS — M1990 Unspecified osteoarthritis, unspecified site: Secondary | ICD-10-CM | POA: Diagnosis present

## 2014-03-01 DIAGNOSIS — N1832 Chronic kidney disease, stage 3b: Secondary | ICD-10-CM | POA: Diagnosis present

## 2014-03-01 DIAGNOSIS — E1140 Type 2 diabetes mellitus with diabetic neuropathy, unspecified: Secondary | ICD-10-CM | POA: Diagnosis present

## 2014-03-01 DIAGNOSIS — I10 Essential (primary) hypertension: Secondary | ICD-10-CM | POA: Diagnosis not present

## 2014-03-01 DIAGNOSIS — E559 Vitamin D deficiency, unspecified: Secondary | ICD-10-CM | POA: Diagnosis present

## 2014-03-01 DIAGNOSIS — Z95810 Presence of automatic (implantable) cardiac defibrillator: Secondary | ICD-10-CM | POA: Diagnosis not present

## 2014-03-01 DIAGNOSIS — Z79899 Other long term (current) drug therapy: Secondary | ICD-10-CM | POA: Diagnosis not present

## 2014-03-01 DIAGNOSIS — Z85828 Personal history of other malignant neoplasm of skin: Secondary | ICD-10-CM | POA: Diagnosis not present

## 2014-03-01 DIAGNOSIS — Z794 Long term (current) use of insulin: Secondary | ICD-10-CM

## 2014-03-01 DIAGNOSIS — I252 Old myocardial infarction: Secondary | ICD-10-CM | POA: Diagnosis not present

## 2014-03-01 DIAGNOSIS — E039 Hypothyroidism, unspecified: Secondary | ICD-10-CM | POA: Diagnosis present

## 2014-03-01 DIAGNOSIS — N179 Acute kidney failure, unspecified: Secondary | ICD-10-CM | POA: Diagnosis not present

## 2014-03-01 DIAGNOSIS — Z87891 Personal history of nicotine dependence: Secondary | ICD-10-CM

## 2014-03-01 DIAGNOSIS — L0390 Cellulitis, unspecified: Secondary | ICD-10-CM | POA: Insufficient documentation

## 2014-03-01 HISTORY — DX: Cellulitis of left lower limb: L03.116

## 2014-03-01 HISTORY — DX: Cutaneous abscess of limb, unspecified: L02.419

## 2014-03-01 HISTORY — DX: Presence of automatic (implantable) cardiac defibrillator: Z95.810

## 2014-03-01 HISTORY — DX: Cellulitis of unspecified part of limb: L03.119

## 2014-03-01 LAB — COMPREHENSIVE METABOLIC PANEL WITH GFR
ALT: 20 U/L (ref 0–53)
AST: 19 U/L (ref 0–37)
Albumin: 3.2 g/dL — ABNORMAL LOW (ref 3.5–5.2)
Alkaline Phosphatase: 57 U/L (ref 39–117)
Anion gap: 14 (ref 5–15)
BUN: 76 mg/dL — ABNORMAL HIGH (ref 6–23)
CO2: 26 meq/L (ref 19–32)
Calcium: 10.2 mg/dL (ref 8.4–10.5)
Chloride: 101 meq/L (ref 96–112)
Creatinine, Ser: 2.55 mg/dL — ABNORMAL HIGH (ref 0.50–1.35)
GFR calc Af Amer: 25 mL/min — ABNORMAL LOW
GFR calc non Af Amer: 22 mL/min — ABNORMAL LOW
Glucose, Bld: 100 mg/dL — ABNORMAL HIGH (ref 70–99)
Potassium: 5.4 meq/L — ABNORMAL HIGH (ref 3.7–5.3)
Sodium: 141 meq/L (ref 137–147)
Total Bilirubin: 0.4 mg/dL (ref 0.3–1.2)
Total Protein: 7.1 g/dL (ref 6.0–8.3)

## 2014-03-01 LAB — CBC WITH DIFFERENTIAL/PLATELET
Basophils Absolute: 0 10*3/uL (ref 0.0–0.1)
Basophils Relative: 0 % (ref 0–1)
EOS ABS: 0 10*3/uL (ref 0.0–0.7)
Eosinophils Relative: 0 % (ref 0–5)
HCT: 37.3 % — ABNORMAL LOW (ref 39.0–52.0)
Hemoglobin: 12.3 g/dL — ABNORMAL LOW (ref 13.0–17.0)
Lymphocytes Relative: 16 % (ref 12–46)
Lymphs Abs: 2.1 10*3/uL (ref 0.7–4.0)
MCH: 31.8 pg (ref 26.0–34.0)
MCHC: 33 g/dL (ref 30.0–36.0)
MCV: 96.4 fL (ref 78.0–100.0)
MONO ABS: 0.9 10*3/uL (ref 0.1–1.0)
Monocytes Relative: 7 % (ref 3–12)
NEUTROS PCT: 77 % (ref 43–77)
Neutro Abs: 10 10*3/uL — ABNORMAL HIGH (ref 1.7–7.7)
Platelets: 231 10*3/uL (ref 150–400)
RBC: 3.87 MIL/uL — ABNORMAL LOW (ref 4.22–5.81)
RDW: 13.1 % (ref 11.5–15.5)
WBC: 13 10*3/uL — ABNORMAL HIGH (ref 4.0–10.5)

## 2014-03-01 LAB — GLUCOSE, CAPILLARY
Glucose-Capillary: 121 mg/dL — ABNORMAL HIGH (ref 70–99)
Glucose-Capillary: 148 mg/dL — ABNORMAL HIGH (ref 70–99)

## 2014-03-01 MED ORDER — ONDANSETRON HCL 4 MG/2ML IJ SOLN: 4.0000 mg | Freq: Four times a day (QID) | INTRAMUSCULAR | Status: DC | PRN

## 2014-03-01 MED ORDER — ESCITALOPRAM OXALATE 10 MG PO TABS
10.0000 mg | ORAL_TABLET | Freq: Every day | ORAL | Status: DC
  Administered 2014-03-01 – 2014-03-03 (×3): 10 mg via ORAL
  Filled 2014-03-01 (×3): qty 1

## 2014-03-01 MED ORDER — ACETAMINOPHEN 500 MG PO TABS: 500.0000 mg | ORAL_TABLET | Freq: Every day | ORAL | Status: DC | PRN

## 2014-03-01 MED ORDER — CEFAZOLIN SODIUM 1-5 GM-% IV SOLN
1.0000 g | Freq: Three times a day (TID) | INTRAVENOUS | Status: DC
  Administered 2014-03-01: 1 g via INTRAVENOUS
  Filled 2014-03-01 (×3): qty 50

## 2014-03-01 MED ORDER — IPRATROPIUM BROMIDE 0.06 % NA SOLN
2.0000 | Freq: Two times a day (BID) | NASAL | Status: DC
  Filled 2014-03-01: qty 15

## 2014-03-01 MED ORDER — AMIODARONE HCL 200 MG PO TABS
200.0000 mg | ORAL_TABLET | Freq: Two times a day (BID) | ORAL | Status: DC
Start: 2014-03-01 — End: 2014-03-03
  Administered 2014-03-01 – 2014-03-03 (×4): 200 mg via ORAL
  Filled 2014-03-01 (×5): qty 1

## 2014-03-01 MED ORDER — ATORVASTATIN CALCIUM 40 MG PO TABS
40.0000 mg | ORAL_TABLET | Freq: Every day | ORAL | Status: DC
  Administered 2014-03-01 – 2014-03-03 (×3): 40 mg via ORAL
  Filled 2014-03-01 (×3): qty 1

## 2014-03-01 MED ORDER — PRAMIPEXOLE DIHYDROCHLORIDE 0.25 MG PO TABS
0.5000 mg | ORAL_TABLET | Freq: Every day | ORAL | Status: DC
  Administered 2014-03-01 – 2014-03-03 (×3): 0.5 mg via ORAL
  Filled 2014-03-01 (×3): qty 2

## 2014-03-01 MED ORDER — CARVEDILOL 6.25 MG PO TABS
18.7500 mg | ORAL_TABLET | Freq: Two times a day (BID) | ORAL | Status: DC
  Filled 2014-03-01 (×2): qty 1

## 2014-03-01 MED ORDER — CEFAZOLIN SODIUM 1-5 GM-% IV SOLN
1.0000 g | Freq: Two times a day (BID) | INTRAVENOUS | Status: DC
  Administered 2014-03-02: 1 g via INTRAVENOUS
  Filled 2014-03-01 (×2): qty 50

## 2014-03-01 MED ORDER — IPRATROPIUM BROMIDE 0.03 % NA SOLN
2.0000 | Freq: Two times a day (BID) | NASAL | Status: DC
  Filled 2014-03-01: qty 30

## 2014-03-01 MED ORDER — ONDANSETRON HCL 4 MG PO TABS: 4.0000 mg | ORAL_TABLET | Freq: Four times a day (QID) | ORAL | Status: DC | PRN

## 2014-03-01 MED ORDER — SODIUM POLYSTYRENE SULFONATE 15 GM/60ML PO SUSP
15.0000 g | Freq: Once | ORAL | Status: AC
  Administered 2014-03-01: 15 g via ORAL
  Filled 2014-03-01: qty 60

## 2014-03-01 MED ORDER — APIXABAN 2.5 MG PO TABS
2.5000 mg | ORAL_TABLET | Freq: Two times a day (BID) | ORAL | Status: DC
  Administered 2014-03-01 – 2014-03-03 (×4): 2.5 mg via ORAL
  Filled 2014-03-01 (×5): qty 1

## 2014-03-01 MED ORDER — SODIUM CHLORIDE 0.9 % IV BOLUS (SEPSIS)
500.0000 mL | Freq: Once | INTRAVENOUS | Status: AC
  Administered 2014-03-01: 500 mL via INTRAVENOUS

## 2014-03-01 MED ORDER — CEFAZOLIN SODIUM 1-5 GM-% IV SOLN
1.0000 g | Freq: Three times a day (TID) | INTRAVENOUS | Status: DC
  Filled 2014-03-01 (×2): qty 50

## 2014-03-01 MED ORDER — TAMSULOSIN HCL 0.4 MG PO CAPS
0.4000 mg | ORAL_CAPSULE | Freq: Every day | ORAL | Status: DC
  Administered 2014-03-01 – 2014-03-02 (×2): 0.4 mg via ORAL
  Filled 2014-03-01 (×3): qty 1

## 2014-03-01 MED ORDER — INSULIN DETEMIR 100 UNIT/ML ~~LOC~~ SOLN
18.0000 [IU] | Freq: Every day | SUBCUTANEOUS | Status: DC
  Administered 2014-03-01: 18 [IU] via SUBCUTANEOUS
  Filled 2014-03-01 (×2): qty 0.18

## 2014-03-01 MED ORDER — INSULIN ASPART 100 UNIT/ML ~~LOC~~ SOLN
0.0000 [IU] | Freq: Three times a day (TID) | SUBCUTANEOUS | Status: DC
Start: 2014-03-02 — End: 2014-03-03
  Administered 2014-03-02: 3 [IU] via SUBCUTANEOUS
  Administered 2014-03-02 – 2014-03-03 (×2): 1 [IU] via SUBCUTANEOUS
  Administered 2014-03-03: 2 [IU] via SUBCUTANEOUS

## 2014-03-01 MED ORDER — APIXABAN 5 MG PO TABS
5.0000 mg | ORAL_TABLET | Freq: Two times a day (BID) | ORAL | Status: DC
Start: 2014-03-01 — End: 2014-03-01

## 2014-03-01 MED ORDER — TAVABOROLE 5 % EX SOLN: 1.0000 [drp] | Freq: Every day | CUTANEOUS | Status: DC

## 2014-03-01 NOTE — ED Notes (Signed)
Pt transported to vascular.  °

## 2014-03-01 NOTE — Telephone Encounter (Signed)
[facility] [facility] - Client [facility] Patient Name: [name] Gender: Male DOB: 1928/10/09 Age: 80 Y 59 M 4 D Return Phone Number: [phone] (Primary) Address: [address] City/State/Zip: [place] [address] Client [facility] - Client Client Site [place] - Day Physician Roxy Cedar Contact Type Call Call Type Triage / Clinical Relationship To Patient Self Return Phone Number [phone] (Primary) Chief Complaint Swelling (generalized) Initial Comment Caller states he was seen last week for cellulitis. Has swollen bump in area, advised to seek medical attention. [place] Not Listed UC/ED PreDisposition Did not know what to do Nurse Assessment Nurse: Verlin Fester, RN, [facility] Date/Time (Eastern Time): 03/01/2014 10:33:17 AM Confirm and document reason for call. If symptomatic, describe symptoms. ---Patient states he was seen last week and told he has cellulitis and given antibiotic (Doxycycline)) and there is a bump the size of a half dollar in the area and it is getting white. There is a black spot in the wound Has the patient traveled out of the country within the last 30 days? ---No Does the patient require triage? ---Yes Related visit to physician within the last 2 weeks? ---Yes Does the PT have any chronic conditions? (i.e. diabetes, asthma, etc.) ---Yes List chronic conditions. ---"diabetes, HTN, A-fib Guidelines Guideline Title Affirmed Question Affirmed Notes Nurse Date/Time (Eastern Time) Wound Infection Black (necrotic) or blisters develop in wound [place], RN, [facility] 03/01/2014 10:35:46 AM Disp. Time Eilene Ghazi Time) Disposition Final User 03/01/2014 10:46:44 AM Called On-Call Provider Noe, RN, [facility] Reason: Elayne Snare and asked about appointment in the next 4 hours and they have none 03/01/2014 10:46:54 AM Call Completed Verlin Fester RN,  [facility] 03/01/2014 10:45:03 AM Go to ED Now (or PCP triage) Yes Verlin Fester, RN, [facility] PLEASE NOTE: All timestamps contained within this report are represented as [place] Standard Time. CONFIDENTIALTY NOTICE: This fax transmission is intended only for the addressee. It contains information that is legally privileged, confidential or otherwise protected from use or disclosure. If you are not the intended recipient, you are strictly prohibited from reviewing, disclosing, copying using or disseminating any of this information or taking any action in reliance on or regarding this information. If you have received this fax in error, please notify us immediately by telephone so that we can arrange for its return to [place]. Phone: [phone], Toll-Free: [phone], Fax: [phone] Page: 2 of 2 Call Id: [id number] Caller Understands: Yes Disagree/Comply: Comply Care Advice Given Per Guideline GO TO ED NOW (OR PCP TRIAGE): CARE ADVICE per Wound Infection (Adult) guideline. After Care Instructions Given Call Event Type User Date / Time Description
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
## 2014-03-01 NOTE — ED Notes (Signed)
Pt presents to department for evaluation of L lower leg swelling and pain. Ongoing for several days. L lower leg red and warm and touch. Finished regimen of Doxycycline, no relief of symptoms. Pt is alert and oriented x4. NAD.
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
## 2014-03-01 NOTE — Telephone Encounter (Signed)
Pt seen in ED 

## 2014-03-01 NOTE — Progress Notes (Signed)
Remote ICD transmission.   

## 2014-03-01 NOTE — Progress Notes (Signed)
VASCULAR LAB PRELIMINARY  PRELIMINARY  PRELIMINARY  PRELIMINARY  Left lower extremity venous duplex completed.    Preliminary report: Left lower extremity is negative for deep and superficial vein thrombosis.  Zaia Carre, RVT 03/01/2014, 4:48 PM

## 2014-03-02 ENCOUNTER — Encounter (HOSPITAL_COMMUNITY): Payer: Self-pay | Admitting: General Practice

## 2014-03-02 DIAGNOSIS — N183 Chronic kidney disease, stage 3 (moderate): Secondary | ICD-10-CM

## 2014-03-02 DIAGNOSIS — E875 Hyperkalemia: Secondary | ICD-10-CM | POA: Diagnosis present

## 2014-03-02 DIAGNOSIS — N1832 Chronic kidney disease, stage 3b: Secondary | ICD-10-CM | POA: Diagnosis present

## 2014-03-02 DIAGNOSIS — L02419 Cutaneous abscess of limb, unspecified: Secondary | ICD-10-CM

## 2014-03-02 DIAGNOSIS — I10 Essential (primary) hypertension: Secondary | ICD-10-CM | POA: Diagnosis present

## 2014-03-02 DIAGNOSIS — E785 Hyperlipidemia, unspecified: Secondary | ICD-10-CM | POA: Diagnosis present

## 2014-03-02 DIAGNOSIS — E1140 Type 2 diabetes mellitus with diabetic neuropathy, unspecified: Secondary | ICD-10-CM

## 2014-03-02 DIAGNOSIS — N179 Acute kidney failure, unspecified: Secondary | ICD-10-CM | POA: Diagnosis present

## 2014-03-02 HISTORY — DX: Cellulitis of unspecified part of limb: L02.419

## 2014-03-02 LAB — CBC
HCT: 34.5 % — ABNORMAL LOW (ref 39.0–52.0)
Hemoglobin: 11 g/dL — ABNORMAL LOW (ref 13.0–17.0)
MCH: 30.8 pg (ref 26.0–34.0)
MCHC: 31.9 g/dL (ref 30.0–36.0)
MCV: 96.6 fL (ref 78.0–100.0)
PLATELETS: 222 10*3/uL (ref 150–400)
RBC: 3.57 MIL/uL — AB (ref 4.22–5.81)
RDW: 13 % (ref 11.5–15.5)
WBC: 10.5 10*3/uL (ref 4.0–10.5)

## 2014-03-02 LAB — MDC_IDC_ENUM_SESS_TYPE_REMOTE
Battery Remaining Longevity: 49 mo
Battery Remaining Percentage: 83 %
Battery Voltage: 2.98 V
Brady Statistic AP VP Percent: 93 %
Brady Statistic AP VS Percent: 1 %
HIGH POWER IMPEDANCE MEASURED VALUE: 47 Ohm
HIGH POWER IMPEDANCE MEASURED VALUE: 47 Ohm
Lead Channel Impedance Value: 410 Ohm
Lead Channel Impedance Value: 430 Ohm
Lead Channel Impedance Value: 540 Ohm
Lead Channel Pacing Threshold Amplitude: 1 V
Lead Channel Pacing Threshold Amplitude: 2.75 V
Lead Channel Pacing Threshold Pulse Width: 0.5 ms
Lead Channel Pacing Threshold Pulse Width: 0.5 ms
Lead Channel Sensing Intrinsic Amplitude: 12 mV
Lead Channel Setting Pacing Amplitude: 2 V
Lead Channel Setting Pacing Amplitude: 3.75 V
Lead Channel Setting Pacing Pulse Width: 1 ms
Lead Channel Setting Sensing Sensitivity: 0.5 mV
MDC IDC MSMT LEADCHNL LV PACING THRESHOLD PULSEWIDTH: 1 ms
MDC IDC MSMT LEADCHNL RA PACING THRESHOLD AMPLITUDE: 0.75 V
MDC IDC MSMT LEADCHNL RA SENSING INTR AMPL: 2.4 mV
MDC IDC PG SERIAL: 7170478
MDC IDC SESS DTM: 20151214070016
MDC IDC SET LEADCHNL RV PACING AMPLITUDE: 2 V
MDC IDC SET LEADCHNL RV PACING PULSEWIDTH: 0.5 ms
MDC IDC SET ZONE DETECTION INTERVAL: 250 ms
MDC IDC STAT BRADY AS VP PERCENT: 4.2 %
MDC IDC STAT BRADY AS VS PERCENT: 1 %
MDC IDC STAT BRADY RA PERCENT PACED: 93 %
Zone Setting Detection Interval: 300 ms
Zone Setting Detection Interval: 375 ms

## 2014-03-02 LAB — BASIC METABOLIC PANEL
Anion gap: 14 (ref 5–15)
BUN: 67 mg/dL — AB (ref 6–23)
CO2: 26 mEq/L (ref 19–32)
CREATININE: 2.37 mg/dL — AB (ref 0.50–1.35)
Calcium: 9.4 mg/dL (ref 8.4–10.5)
Chloride: 103 mEq/L (ref 96–112)
GFR, EST AFRICAN AMERICAN: 28 mL/min — AB (ref >90)
GFR, EST NON AFRICAN AMERICAN: 24 mL/min — AB (ref >90)
Glucose, Bld: 74 mg/dL (ref 70–99)
POTASSIUM: 4.5 meq/L (ref 3.7–5.3)
Sodium: 143 mEq/L (ref 137–147)

## 2014-03-02 LAB — GLUCOSE, CAPILLARY
GLUCOSE-CAPILLARY: 222 mg/dL — AB (ref 70–99)
GLUCOSE-CAPILLARY: 183 mg/dL — AB (ref 70–99)
Glucose-Capillary: 87 mg/dL (ref 70–99)
Glucose-Capillary: 140 mg/dL — ABNORMAL HIGH (ref 70–99)

## 2014-03-02 LAB — PATHOLOGIST SMEAR REVIEW

## 2014-03-02 LAB — HEMOGLOBIN A1C
HEMOGLOBIN A1C: 5.7 % — AB (ref <5.7)
Mean Plasma Glucose: 117 mg/dL — ABNORMAL HIGH (ref <117)

## 2014-03-02 MED ORDER — AMOXICILLIN-POT CLAVULANATE 875-125 MG PO TABS
1.0000 | ORAL_TABLET | Freq: Two times a day (BID) | ORAL | Status: DC
  Administered 2014-03-02 – 2014-03-03 (×3): 1 via ORAL
  Filled 2014-03-02 (×4): qty 1

## 2014-03-02 MED ORDER — CARVEDILOL 6.25 MG PO TABS
18.7500 mg | ORAL_TABLET | Freq: Every day | ORAL | Status: DC
  Administered 2014-03-02: 18.75 mg via ORAL
  Filled 2014-03-02 (×2): qty 1

## 2014-03-02 MED ORDER — INSULIN DETEMIR 100 UNIT/ML ~~LOC~~ SOLN
10.0000 [IU] | Freq: Every day | SUBCUTANEOUS | Status: DC
  Administered 2014-03-02: 10 [IU] via SUBCUTANEOUS
  Filled 2014-03-02 (×2): qty 0.1

## 2014-03-02 NOTE — Progress Notes (Signed)
Chart reviewed   [facility] PROGRESS NOTE  [name] [id number] DOB: 1928/12/21 DOA: 03/01/2014 PCP: Lottie Dawson, MD  Assessment/Plan:  Principal Problem:   Cellulitis of left lower extremity:  Improving. Failed outpatient doxycycline. Change to Augmentin. Doppler leg negative. Home tomorrow if continues to improve. Active Problems:   Acute renal failure:  Lasix, spironolactone, ACE inhibitor all held. Creatinine 1.5 in May.  Renal ultrasound without hydronephrosis. Medical renal disease evident.   Hypothyroidism   SYSTOLIC HEART FAILURE, CHRONIC, compensated.  Echocardiogram in 2011 showed ejection fraction of 25%.   Diabetic neuropathy, type II diabetes mellitus:  Blood glucose borderline low this morning. Will decrease Levemir to avoid hypoglycemia. Continue sliding scale.   CKD (chronic kidney disease) stage 3, GFR 30-59 ml/min:  Would stop metformin indefinitely   Hyperlipemia   Hyperkalemia corrected with Kayexalate. ACE inhibitor and spironolactone held.   Essential hypertension, benign  Code Status:  full Family Communication:   Disposition Plan:  Home tomorrow if stable   HPI/Subjective: "Much better"  Objective: Filed Vitals:   03/02/14 0935  BP: 125/66  Pulse: 60  Temp: 97.1 F (36.2 C)  Resp: 18    Intake/Output Summary (Last 24 hours) at 03/02/14 1337 Last data filed at 03/02/14 1100  Gross per 24 hour  Intake      0 ml  Output      0 ml  Net      0 ml   Filed Weights   03/01/14 1211 03/01/14 1744 03/01/14 2053  Weight: 94.802 kg (209 lb) 95.255 kg (210 lb) 95.709 kg (211 lb)    Exam:   General:  Nontoxic. Alert oriented seated at edge of bed eating lunch  Cardiovascular: Regular rate rhythm without murmurs gallops rubs  Respiratory: Clear to auscultation bilaterally without wheezes rhonchi or rales  Abdomen: Soft nontender nondistended. Normal bowel sounds.  Ext: Right leg without edema. Left leg with mild erythema  in the pretibial area with broken capillaries. Edema noted in the calf ankle and foot.  Pedal pulses palpable  Basic Metabolic Panel:  Recent Labs Lab 02/24/14 1035 03/01/14 1219 03/02/14 0449  NA 136 141 143  K 4.4 5.4* 4.5  CL 101 101 103  CO2 27 26 26   GLUCOSE 120* 100* 74  BUN 61* 76* 67*  CREATININE 2.4* 2.55* 2.37*  CALCIUM 9.0 10.2 9.4   Liver Function Tests:  Recent Labs Lab 03/01/14 1219  AST 19  ALT 20  ALKPHOS 57  BILITOT 0.4  PROT 7.1  ALBUMIN 3.2*   No results for input(s): LIPASE, AMYLASE in the last 168 hours. No results for input(s): AMMONIA in the last 168 hours. CBC:  Recent Labs Lab 02/24/14 1035 03/01/14 1219 03/02/14 0449  WBC 16.5* 13.0* 10.5  NEUTROABS 14.3* 10.0*  --   HGB 12.4* 12.3* 11.0*  HCT 38.1* 37.3* 34.5*  MCV 98.4 96.4 96.6  PLT 162.0 231 222   Cardiac Enzymes: No results for input(s): CKTOTAL, CKMB, CKMBINDEX, TROPONINI in the last 168 hours. BNP (last 3 results) No results for input(s): PROBNP in the last 8760 hours. CBG:  Recent Labs Lab 03/01/14 1743 03/01/14 2049 03/02/14 0817  GLUCAP 121* 148* 87    No results found for this or any previous visit (from the past 240 hour(s)).   Studies: US Renal  03/01/2014   CLINICAL DATA:  Acute on chronic renal failure  EXAM: RENAL/URINARY TRACT ULTRASOUND COMPLETE  COMPARISON:  None.  FINDINGS: Right Kidney:  Length: 12.1 cm in  length. There is increased cortical echogenicity with cortical thinning. No hydronephrosis or mass. Innumerable benign appearing cysts are present. The largest is in the upper pole measuring 2.0 cm.  Left Kidney:  Length: 12.4 cm in length. Increased cortical echogenicity with thinning. Innumerable benign appearing cysts are present. The largest is in the interpolar region measuring 5.0 cm.  Bladder:  Within normal limits.  Mildly distended.  IMPRESSION: Increased cortical echogenicity compatible with medical renal parenchymal disease.  No evidence of  hydronephrosis.   Electronically Signed   By: Maryclare Bean M.D.   On: 03/01/2014 19:31    Scheduled Meds: . amiodarone  200 mg Oral BID  . apixaban  2.5 mg Oral BID  . atorvastatin  40 mg Oral Daily  . carvedilol  18.75 mg Oral BID WC  .  ceFAZolin (ANCEF) IV  1 g Intravenous Q12H  . escitalopram  10 mg Oral Daily  . insulin aspart  0-9 Units Subcutaneous TID WC  . insulin detemir  18 Units Subcutaneous QHS  . ipratropium  2 spray Each Nare BID  . pramipexole  0.5 mg Oral Daily  . tamsulosin  0.4 mg Oral QPC supper  . Tavaborole  1 drop Topical Daily   Continuous Infusions:   Time spent: 35 minutes  [facility] [email or web address], password [facility] 03/02/2014, 1:37 PM  LOS: 1 day

## 2014-03-02 NOTE — Discharge Instructions (Signed)

## 2014-03-02 NOTE — Progress Notes (Signed)
UR Completed.  [phone]  

## 2014-03-03 LAB — BASIC METABOLIC PANEL
Anion gap: 12 (ref 5–15)
BUN: 47 mg/dL — ABNORMAL HIGH (ref 6–23)
CHLORIDE: 105 meq/L (ref 96–112)
CO2: 27 meq/L (ref 19–32)
Calcium: 9.6 mg/dL (ref 8.4–10.5)
Creatinine, Ser: 1.9 mg/dL — ABNORMAL HIGH (ref 0.50–1.35)
GFR calc Af Amer: 36 mL/min — ABNORMAL LOW (ref >90)
GFR, EST NON AFRICAN AMERICAN: 31 mL/min — AB (ref >90)
GLUCOSE: 118 mg/dL — AB (ref 70–99)
POTASSIUM: 4.5 meq/L (ref 3.7–5.3)
Sodium: 144 mEq/L (ref 137–147)

## 2014-03-03 LAB — GLUCOSE, CAPILLARY: GLUCOSE-CAPILLARY: 122 mg/dL — AB (ref 70–99)

## 2014-03-03 MED ORDER — AMOXICILLIN-POT CLAVULANATE 875-125 MG PO TABS: 1.0000 | ORAL_TABLET | Freq: Two times a day (BID) | ORAL | Status: DC

## 2014-03-03 MED ORDER — APIXABAN 2.5 MG PO TABS: 2.5000 mg | ORAL_TABLET | Freq: Two times a day (BID) | ORAL | Status: DC

## 2014-03-03 NOTE — Discharge Summary (Signed)
Physician Discharge Summary  [name] [id number] DOB: 10-25-1928 DOA: 03/01/2014  PCP: [name] Dawson, MD  Admit date: 03/01/2014 Discharge date: 03/03/2014  Time spent: greater than 30 minutes  Recommendations for Outpatient Follow-up:  1. Monitor BMET. Spironolactone held. Defer to cardiology when safe to resume 2. Monitor cellulitis  Discharge Diagnoses:  Principal Problem:   Cellulitis of left lower extremity Active Problems:   Hypothyroidism   SYSTOLIC HEART FAILURE, CHRONIC   Diabetic neuropathy, type II diabetes mellitus   Acute renal failure   CKD (chronic kidney disease) stage 3, GFR 30-59 ml/min   Hyperlipemia   Hyperkalemia   Essential hypertension, benign acute renal failure  Discharge Condition: stable  Filed Weights   03/01/14 1744 03/01/14 2053 03/02/14 2140  Weight: 95.255 kg (210 lb) 95.709 kg (211 lb) 96.344 kg (212 lb 6.4 oz)    History of present illness:  80 y.o. male With coronary artery disease, status post coronary bypassing graft, ischemic cardiomyopathy, CHF, and status post ICD, hypertension, diabetes mellitus, hyperlipidemia, presented with persistent cellulitis , despite 5 day sof doxycycline. On arrival to ED, his left lwoer extremity is red , swollen and tender. He denies any other complaints. His labs revealed worsening of his renal function, leukocytosis and hyperkalemia. He was referred for admission to medical service for the evaluation and management of persistent cellulitis, acute on chronic renal failure and hyperkalemia.   Hospital Course:  Patient was admitted to telemetry.    Cellulitis of left lower extremity: Started on cephalexin by admitting physician. Erythema, pain, warmth improved quickly and patient was changed to Augmentin. Failed outpatient doxycycline. home on another week of Augmentin.  Instructed patient to call primary care provider if cellulitis worsens or fails to improve.   Acute renal failure:  Lasix, spironolactone, ACE inhibitor all held. Creatinine 1.5 in May. Renal ultrasound without hydronephrosis. Medical renal disease evident. Creatinine 2.55 on admission. 1.9 at discharge. Have resumed Lasix and ACE inhibitor, but instructed patient to hold spironolactone until follow-up with his cardiologist this Friday. Please check basic metabolic panel with specific attention to creatinine and potassium.  Also, apixiban dose decreased due to renal insufficiency. Patient instructed to stop NSAIDs.   SYSTOLIC HEART FAILURE, CHRONIC, compensated. Echocardiogram in 2011 showed ejection fraction of 25%.   Diabetic neuropathy, type II diabetes mellitus: Metformin is stopped as patient has chronic kidney disease in addition to acute renal failure. Would not resume.   Hyperkalemia corrected with Kayexalate. ACE inhibitor and spironolactone held.  Please recheck as outpatient. Have held spironolactone at discharge but resumed ACE inhibitor.  Procedures:  None  Consultations:  None  Discharge Exam: Filed Vitals:   03/03/14 1017  BP: 139/65  Pulse: 60  Temp: 98.1 F (36.7 C)  Resp: 18    General: Comfortable. Nontoxic appearing. Cardiovascular: Regular rate rhythm without murmurs, rubs Respiratory: Clear to auscultation bilaterally without wheezes rhonchi or rales Extremities: Faint erythema of the pretibial area on the left.   Discharge Instructions    Activity as tolerated - No restrictions    Complete by:  As directed      Diet - low sodium heart healthy    Complete by:  As directed      Diet Carb Modified    Complete by:  As directed      Discharge instructions    Complete by:  As directed   Keep left leg elevated when seated or in bed.  Stop metformin. Change eliquis to 2.5 mg twice daily.  Hold  spironolactone until follow up with Dr. Stanford [name].  Stop doxycycline.          Current Discharge Medication List    START taking these medications   Details   amoxicillin-clavulanate (AUGMENTIN) 875-125 MG per tablet Take 1 tablet by mouth every 12 (twelve) hours. Qty: 14 tablet, Refills: 0      CONTINUE these medications which have CHANGED   Details  apixaban (ELIQUIS) 2.5 MG TABS tablet Take 1 tablet (2.5 mg total) by mouth 2 (two) times daily. Qty: 60 tablet, Refills: 1      CONTINUE these medications which have NOT CHANGED   Details  acetaminophen (TYLENOL) 500 MG tablet Take 500 mg by mouth daily as needed for mild pain.    amiodarone (PACERONE) 200 MG tablet Take 200 mg by mouth 2 (two) times daily.    atorvastatin (LIPITOR) 40 MG tablet Take 1 tablet (40 mg total) by mouth daily. Qty: 90 tablet, Refills: 3   Associated Diagnoses: CAD (coronary artery disease)    carvedilol (COREG) 12.5 MG tablet 1 1/2 tab po bid Qty: 270 tablet, Refills: 3   Associated Diagnoses: CAD (coronary artery disease)    escitalopram (LEXAPRO) 10 MG tablet Take 1 tablet (10 mg total) by mouth daily. Qty: 90 tablet, Refills: 1    furosemide (LASIX) 40 MG tablet Take 1.5 tablets (60 mg total) by mouth daily. every morning Qty: 45 tablet, Refills: 3    glipiZIDE (GLUCOTROL) 10 MG tablet Take 1 tablet (10 mg total) by mouth 2 (two) times daily before a meal. Qty: 180 tablet, Refills: 1    Insulin Detemir (LEVEMIR) 100 UNIT/ML Pen Inject 18 Units into the skin at bedtime. as directed Qty: 2 pen, Refills: 0   Associated Diagnoses: Chronic systolic heart failure; Ischemic cardiomyopathy    ipratropium (ATROVENT) 0.03 % nasal spray Place 2 sprays into both nostrils daily as needed for rhinitis.    ramipril (ALTACE) 2.5 MG capsule Take 2.5 mg by mouth 2 (two) times daily.    saxagliptin HCl (ONGLYZA) 5 MG TABS tablet Take 1 tablet (5 mg total) by mouth daily. Qty: 90 tablet, Refills: 1   Associated Diagnoses: Unspecified essential hypertension; Restless legs syndrome (RLS); Chronic systolic heart failure; Other and unspecified hyperlipidemia; CAD  (coronary artery disease); Diabetic neuropathy, type II diabetes mellitus; Renal insufficiency; Sleep difficulties    silodosin (RAPAFLO) 8 MG CAPS capsule Take 1 capsule (8 mg total) by mouth daily with breakfast. Qty: 90 capsule, Refills: 1    Tavaborole 5 % SOLN Apply 1 drop topically daily. Applied one drop each affected nail once daily for 12 months    pramipexole (MIRAPEX) 0.5 MG tablet Take 1 tablet (0.5 mg total) by mouth daily. For restless legs Qty: 90 tablet, Refills: 3   Associated Diagnoses: Unspecified essential hypertension; Restless legs syndrome (RLS); Chronic systolic heart failure; Other and unspecified hyperlipidemia; CAD (coronary artery disease); Diabetic neuropathy, type II diabetes mellitus; Renal insufficiency; Sleep difficulties      STOP taking these medications     doxycycline (VIBRA-TABS) 100 MG tablet      metFORMIN (GLUCOPHAGE) 1000 MG tablet      Naproxen Sod-Diphenhydramine (ALEVE PM) 220-25 MG TABS      spironolactone (ALDACTONE) 25 MG tablet        No Known Allergies Follow-up Information    Follow up with [name] Dawson, MD.   Specialties:  Internal Medicine, Pediatrics   Why:  If symptoms worsen   Contact information:   [place]  [address] [place] [address] [phone]       Follow up with Kirk Ruths, MD.   Specialty:  Cardiology   Why:  as previously scheduled to check BMET (kidney and potassium labwork) to see whether to resume spironolactone   Contact information:   [address] [place] [address] [phone]        The results of significant diagnostics from this hospitalization (including imaging, microbiology, ancillary and laboratory) are listed below for reference.    Significant Diagnostic Studies: US Renal  03/01/2014   CLINICAL DATA:  Acute on chronic renal failure  EXAM: RENAL/URINARY TRACT ULTRASOUND COMPLETE  COMPARISON:  None.  FINDINGS: Right Kidney:  Length: 12.1 cm in length.  There is increased cortical echogenicity with cortical thinning. No hydronephrosis or mass. Innumerable benign appearing cysts are present. The largest is in the upper pole measuring 2.0 cm.  Left Kidney:  Length: 12.4 cm in length. Increased cortical echogenicity with thinning. Innumerable benign appearing cysts are present. The largest is in the interpolar region measuring 5.0 cm.  Bladder:  Within normal limits.  Mildly distended.  IMPRESSION: Increased cortical echogenicity compatible with medical renal parenchymal disease.  No evidence of hydronephrosis.   Electronically Signed   By: Maryclare Bean M.D.   On: 03/01/2014 19:31    Microbiology: No results found for this or any previous visit (from the past 240 hour(s)).   Labs: Basic Metabolic Panel:  Recent Labs Lab 03/01/14 1219 03/02/14 0449 03/03/14 0554  NA 141 143 144  K 5.4* 4.5 4.5  CL 101 103 105  CO2 26 26 27   GLUCOSE 100* 74 118*  BUN 76* 67* 47*  CREATININE 2.55* 2.37* 1.90*  CALCIUM 10.2 9.4 9.6   Liver Function Tests:  Recent Labs Lab 03/01/14 1219  AST 19  ALT 20  ALKPHOS 57  BILITOT 0.4  PROT 7.1  ALBUMIN 3.2*   No results for input(s): LIPASE, AMYLASE in the last 168 hours. No results for input(s): AMMONIA in the last 168 hours. CBC:  Recent Labs Lab 03/01/14 1219 03/02/14 0449  WBC 13.0* 10.5  NEUTROABS 10.0*  --   HGB 12.3* 11.0*  HCT 37.3* 34.5*  MCV 96.4 96.6  PLT 231 222   Cardiac Enzymes: No results for input(s): CKTOTAL, CKMB, CKMBINDEX, TROPONINI in the last 168 hours. BNP: BNP (last 3 results) No results for input(s): PROBNP in the last 8760 hours. CBG:  Recent Labs Lab 03/02/14 0817 03/02/14 1238 03/02/14 1646 03/02/14 2138 03/03/14 0738  GLUCAP 87 140* 222* 183* 122*       Signed:  Gerene Nedd L  [facility] 03/03/2014, 11:41 AM

## 2014-03-03 NOTE — Progress Notes (Signed)
Pt discharged home with wife. Prescriptions and discharge instructions given. Pt verbalized signs and symptoms of worsening condition and when to call the doctor. Follow up appointments discussed.    Penni Bombard, RN 03/03/2014 1:49 PM

## 2014-03-04 LAB — GLUCOSE, CAPILLARY: GLUCOSE-CAPILLARY: 171 mg/dL — AB (ref 70–99)

## 2014-03-05 ENCOUNTER — Ambulatory Visit (INDEPENDENT_AMBULATORY_CARE_PROVIDER_SITE_OTHER): Payer: Medicare Other | Admitting: Cardiology

## 2014-03-05 ENCOUNTER — Ambulatory Visit (INDEPENDENT_AMBULATORY_CARE_PROVIDER_SITE_OTHER)
Admission: RE | Admit: 2014-03-05 | Discharge: 2014-03-05 | Disposition: A | Discharge status: Home / Self Care | Payer: Medicare Other | Source: Ambulatory Visit | Attending: Cardiology | Admitting: Cardiology

## 2014-03-05 ENCOUNTER — Encounter: Payer: Self-pay | Admitting: Cardiology

## 2014-03-05 ENCOUNTER — Encounter: Payer: Self-pay | Admitting: *Deleted

## 2014-03-05 VITALS — BP 130/68 | HR 64 | Ht 73.0 in | Wt 217.6 lb

## 2014-03-05 DIAGNOSIS — I4891 Unspecified atrial fibrillation: Secondary | ICD-10-CM | POA: Diagnosis not present

## 2014-03-05 DIAGNOSIS — I25810 Atherosclerosis of coronary artery bypass graft(s) without angina pectoris: Secondary | ICD-10-CM | POA: Diagnosis not present

## 2014-03-05 DIAGNOSIS — I6529 Occlusion and stenosis of unspecified carotid artery: Secondary | ICD-10-CM

## 2014-03-05 DIAGNOSIS — I255 Ischemic cardiomyopathy: Secondary | ICD-10-CM

## 2014-03-05 DIAGNOSIS — Z951 Presence of aortocoronary bypass graft: Secondary | ICD-10-CM | POA: Diagnosis not present

## 2014-03-05 DIAGNOSIS — I10 Essential (primary) hypertension: Secondary | ICD-10-CM | POA: Diagnosis not present

## 2014-03-05 DIAGNOSIS — I5022 Chronic systolic (congestive) heart failure: Secondary | ICD-10-CM | POA: Diagnosis not present

## 2014-03-05 DIAGNOSIS — Z4502 Encounter for adjustment and management of automatic implantable cardiac defibrillator: Secondary | ICD-10-CM | POA: Diagnosis not present

## 2014-03-05 DIAGNOSIS — I679 Cerebrovascular disease, unspecified: Secondary | ICD-10-CM

## 2014-03-05 DIAGNOSIS — R0602 Shortness of breath: Secondary | ICD-10-CM | POA: Diagnosis not present

## 2014-03-05 DIAGNOSIS — I480 Paroxysmal atrial fibrillation: Secondary | ICD-10-CM

## 2014-03-05 LAB — BASIC METABOLIC PANEL WITH GFR
BUN: 36 mg/dL — ABNORMAL HIGH (ref 6–23)
CALCIUM: 9.4 mg/dL (ref 8.4–10.5)
CO2: 27 mEq/L (ref 19–32)
CREATININE: 1.8 mg/dL — AB (ref 0.50–1.35)
Chloride: 106 mEq/L (ref 96–112)
GFR, EST NON AFRICAN AMERICAN: 34 mL/min — AB
GFR, Est African American: 39 mL/min — ABNORMAL LOW
Glucose, Bld: 134 mg/dL — ABNORMAL HIGH (ref 70–99)
Potassium: 5.2 mEq/L (ref 3.5–5.3)
Sodium: 141 mEq/L (ref 135–145)

## 2014-03-05 LAB — TSH: TSH: 1.826 u[IU]/mL (ref 0.350–4.500)

## 2014-03-05 MED ORDER — FUROSEMIDE 40 MG PO TABS: 60.0000 mg | ORAL_TABLET | Freq: Every day | ORAL | Status: DC

## 2014-03-05 MED ORDER — CARVEDILOL 25 MG PO TABS: 25.0000 mg | ORAL_TABLET | Freq: Two times a day (BID) | ORAL | Status: DC

## 2014-03-05 MED ORDER — AMIODARONE HCL 200 MG PO TABS: 200.0000 mg | ORAL_TABLET | Freq: Every day | ORAL | Status: DC

## 2014-03-05 NOTE — Assessment & Plan Note (Signed)
Continue present dose of Lasix. Check potassium and renal function. He may require nephrology evaluation if renal function deteriorates.

## 2014-03-05 NOTE — Assessment & Plan Note (Signed)
Continue amiodarone and apixaban. Recent liver functions normal. Check TSH and chest x-ray.

## 2014-03-05 NOTE — Assessment & Plan Note (Signed)
Blood pressure controlled. Continue present medications. 

## 2014-03-05 NOTE — Assessment & Plan Note (Signed)
Followed by electrophysiology. 

## 2014-03-05 NOTE — Assessment & Plan Note (Signed)
Continue statin. 

## 2014-03-05 NOTE — Assessment & Plan Note (Signed)
Continue statin. Not on aspirin given need for anticoagulation. 

## 2014-03-05 NOTE — Patient Instructions (Signed)
Your physician recommends that you schedule a follow-up appointment in: 3 MONTHS WITH DR CRENSHAW  DECREASE AMIODARONE TO 200 MG ONCE DAILY  Your physician recommends that you HAVE LAB WORK TODAY  INCREASE CARVEDILOL TO 25 MG TWICE DAILY  A chest x-ray takes a picture of the organs and structures inside the chest, including the heart, lungs, and blood vessels. This test can show several things, including, whether the heart is enlarges; whether fluid is building up in the lungs; and whether pacemaker / defibrillator leads are still in place. [address] [facility]= GO TO THE BASEMENT  Your physician has requested that you have a carotid duplex. This test is an ultrasound of the carotid arteries in your neck. It looks at blood flow through these arteries that supply the brain with blood. Allow one hour for this exam. There are no restrictions or special instructions.

## 2014-03-05 NOTE — Assessment & Plan Note (Signed)
Continue ACE inhibitor. Increase Coreg to 25 mg twice a day.

## 2014-03-05 NOTE — Addendum Note (Signed)
Addended by: Cristopher Estimable on: 03/05/2014 10:04 AM   Modules accepted: Orders

## 2014-03-05 NOTE — Assessment & Plan Note (Signed)
Continue statin. Repeat carotid Dopplers.

## 2014-03-24 ENCOUNTER — Ambulatory Visit (INDEPENDENT_AMBULATORY_CARE_PROVIDER_SITE_OTHER): Payer: Medicare Other | Admitting: Internal Medicine

## 2014-03-24 ENCOUNTER — Encounter: Payer: Self-pay | Admitting: Internal Medicine

## 2014-03-24 VITALS — BP 132/64 | HR 60 | Temp 98.0°F | Ht 73.0 in | Wt 228.8 lb

## 2014-03-24 DIAGNOSIS — Z79899 Other long term (current) drug therapy: Secondary | ICD-10-CM

## 2014-03-24 DIAGNOSIS — I5022 Chronic systolic (congestive) heart failure: Secondary | ICD-10-CM | POA: Diagnosis not present

## 2014-03-24 DIAGNOSIS — E119 Type 2 diabetes mellitus without complications: Secondary | ICD-10-CM | POA: Diagnosis not present

## 2014-03-24 DIAGNOSIS — I255 Ischemic cardiomyopathy: Secondary | ICD-10-CM | POA: Diagnosis not present

## 2014-03-24 DIAGNOSIS — N289 Disorder of kidney and ureter, unspecified: Secondary | ICD-10-CM

## 2014-03-24 DIAGNOSIS — R0602 Shortness of breath: Secondary | ICD-10-CM

## 2014-03-24 DIAGNOSIS — E1140 Type 2 diabetes mellitus with diabetic neuropathy, unspecified: Secondary | ICD-10-CM | POA: Diagnosis not present

## 2014-03-24 DIAGNOSIS — Z87898 Personal history of other specified conditions: Secondary | ICD-10-CM

## 2014-03-24 DIAGNOSIS — R600 Localized edema: Secondary | ICD-10-CM

## 2014-03-24 DIAGNOSIS — R635 Abnormal weight gain: Secondary | ICD-10-CM | POA: Diagnosis not present

## 2014-03-24 DIAGNOSIS — Z789 Other specified health status: Secondary | ICD-10-CM

## 2014-03-24 LAB — BASIC METABOLIC PANEL
BUN: 24 mg/dL — ABNORMAL HIGH (ref 6–23)
CO2: 32 mEq/L (ref 19–32)
Calcium: 8.8 mg/dL (ref 8.4–10.5)
Chloride: 106 mEq/L (ref 96–112)
Creatinine, Ser: 1.5 mg/dL (ref 0.4–1.5)
GFR: 47.05 mL/min — ABNORMAL LOW (ref >60.00)
Glucose, Bld: 183 mg/dL — ABNORMAL HIGH (ref 70–99)
POTASSIUM: 3.8 meq/L (ref 3.5–5.1)
Sodium: 142 mEq/L (ref 135–145)

## 2014-03-24 LAB — CBC WITH DIFFERENTIAL/PLATELET
Basophils Absolute: 0 10*3/uL (ref 0.0–0.1)
Basophils Relative: 0.2 % (ref 0.0–3.0)
EOS ABS: 0 10*3/uL (ref 0.0–0.7)
Eosinophils Relative: 0.3 % (ref 0.0–5.0)
HEMATOCRIT: 38.7 % — AB (ref 39.0–52.0)
Hemoglobin: 12.4 g/dL — ABNORMAL LOW (ref 13.0–17.0)
Lymphocytes Relative: 13.8 % (ref 12.0–46.0)
Lymphs Abs: 1.5 10*3/uL (ref 0.7–4.0)
MCHC: 31.9 g/dL (ref 30.0–36.0)
MCV: 97.3 fl (ref 78.0–100.0)
MONOS PCT: 6.4 % (ref 3.0–12.0)
Monocytes Absolute: 0.7 10*3/uL (ref 0.1–1.0)
NEUTROS ABS: 8.5 10*3/uL — AB (ref 1.4–7.7)
Neutrophils Relative %: 79.3 % — ABNORMAL HIGH (ref 43.0–77.0)
Platelets: 169 10*3/uL (ref 150.0–400.0)
RBC: 3.98 Mil/uL — ABNORMAL LOW (ref 4.22–5.81)
RDW: 14 % (ref 11.5–15.5)
WBC: 10.8 10*3/uL — AB (ref 4.0–10.5)

## 2014-03-24 MED ORDER — PRAMIPEXOLE DIHYDROCHLORIDE 0.5 MG PO TABS: ORAL_TABLET | ORAL | Status: DC

## 2014-03-24 MED ORDER — GLIPIZIDE 10 MG PO TABS: 10.0000 mg | ORAL_TABLET | Freq: Two times a day (BID) | ORAL | Status: DC

## 2014-03-24 MED ORDER — SILODOSIN 8 MG PO CAPS: 8.0000 mg | ORAL_CAPSULE | Freq: Every day | ORAL | Status: DC

## 2014-03-24 MED ORDER — ESCITALOPRAM OXALATE 10 MG PO TABS: 10.0000 mg | ORAL_TABLET | Freq: Every day | ORAL | Status: DC

## 2014-03-24 MED ORDER — SAXAGLIPTIN HCL 5 MG PO TABS: 5.0000 mg | ORAL_TABLET | Freq: Every day | ORAL | Status: DC

## 2014-03-24 NOTE — Patient Instructions (Addendum)
Concern a obut weight gain and shortness of breath recently Labs today and may decide on increaesint the lasix to twice a day for a few days and get cardiology to opine about this. The left swelling seems to be left over from the  Infection and you may benefit from compression stocking .   Wt Readings from Last 3 Encounters:  03/24/14 228 lb 12.8 oz (103.783 kg)  03/05/14 217 lb 9.6 oz (98.703 kg)  03/02/14 212 lb 6.4 oz (96.344 kg)    Lab Results  Component Value Date   WBC 10.5 03/02/2014   HGB 11.0* 03/02/2014   HCT 34.5* 03/02/2014   PLT 222 03/02/2014   GLUCOSE 134* 03/05/2014   CHOL 75 03/13/2013   TRIG 127.0 03/13/2013   HDL 33.80* 03/13/2013   LDLCALC 16 03/13/2013   ALT 20 03/01/2014   AST 19 03/01/2014   NA 141 03/05/2014   K 5.2 03/05/2014   CL 106 03/05/2014   CREATININE 1.80* 03/05/2014   BUN 36* 03/05/2014   CO2 27 03/05/2014   TSH 1.826 03/05/2014   PSA 2.47 08/02/2010   INR 1.11 12/16/2009   HGBA1C 5.7* 03/02/2014   MICROALBUR 0.6 08/14/2011
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
## 2014-03-25 ENCOUNTER — Encounter: Payer: Self-pay | Admitting: Cardiology

## 2014-03-25 ENCOUNTER — Ambulatory Visit (INDEPENDENT_AMBULATORY_CARE_PROVIDER_SITE_OTHER)
Admission: RE | Admit: 2014-03-25 | Discharge: 2014-03-25 | Disposition: A | Discharge status: Home / Self Care | Payer: Medicare Other | Source: Ambulatory Visit | Attending: Internal Medicine | Admitting: Internal Medicine

## 2014-03-25 ENCOUNTER — Telehealth: Payer: Self-pay | Admitting: *Deleted

## 2014-03-25 DIAGNOSIS — N289 Disorder of kidney and ureter, unspecified: Secondary | ICD-10-CM

## 2014-03-25 DIAGNOSIS — I255 Ischemic cardiomyopathy: Secondary | ICD-10-CM

## 2014-03-25 DIAGNOSIS — I10 Essential (primary) hypertension: Secondary | ICD-10-CM

## 2014-03-25 DIAGNOSIS — I4891 Unspecified atrial fibrillation: Secondary | ICD-10-CM

## 2014-03-25 DIAGNOSIS — R600 Localized edema: Secondary | ICD-10-CM | POA: Insufficient documentation

## 2014-03-25 DIAGNOSIS — R635 Abnormal weight gain: Secondary | ICD-10-CM

## 2014-03-25 DIAGNOSIS — R0602 Shortness of breath: Secondary | ICD-10-CM | POA: Diagnosis not present

## 2014-03-25 DIAGNOSIS — I5022 Chronic systolic (congestive) heart failure: Secondary | ICD-10-CM

## 2014-03-25 DIAGNOSIS — Z950 Presence of cardiac pacemaker: Secondary | ICD-10-CM | POA: Diagnosis not present

## 2014-03-25 DIAGNOSIS — I480 Paroxysmal atrial fibrillation: Secondary | ICD-10-CM

## 2014-03-25 DIAGNOSIS — I25810 Atherosclerosis of coronary artery bypass graft(s) without angina pectoris: Secondary | ICD-10-CM

## 2014-03-25 DIAGNOSIS — R509 Fever, unspecified: Secondary | ICD-10-CM | POA: Diagnosis not present

## 2014-03-25 DIAGNOSIS — Z4502 Encounter for adjustment and management of automatic implantable cardiac defibrillator: Secondary | ICD-10-CM

## 2014-03-25 DIAGNOSIS — I679 Cerebrovascular disease, unspecified: Secondary | ICD-10-CM

## 2014-03-25 MED ORDER — FUROSEMIDE 40 MG PO TABS: 60.0000 mg | ORAL_TABLET | Freq: Two times a day (BID) | ORAL | Status: DC

## 2014-03-25 NOTE — Telephone Encounter (Signed)
Spoke with pt, Aware of dr Jacalyn Lefevre recommendations.  New script sent to the pharmacy. He will see the pa and have labs drawn on 04-05-14. Patient voiced understanding to call if he does not get improvement from the increased lasix.

## 2014-03-25 NOTE — Telephone Encounter (Signed)
Change lasix to 60 mg BID; repeat bmet one week     Will have Nyal Schachter schedule fu ov with PA.    Thanks    Kirk Ruths        ----- Message -----     From: Burnis Medin, MD     Sent: 03/25/2014  1:06 PM      To: Lelon Perla, MD        [facility]    Mr Tinnes has gained over 10 pounds in a few weeks and is more SOB . Labs about the same Chest x ray No acute failure Would welcome input about next step . ? A short course of bid lasix ?    Could your team see him in fu as the cause of the increasing SOB?         Thanks    [facility]

## 2014-03-29 ENCOUNTER — Ambulatory Visit (HOSPITAL_COMMUNITY)
Admission: RE | Admit: 2014-03-29 | Discharge: 2014-03-29 | Disposition: A | Discharge status: Home / Self Care | Payer: Medicare Other | Source: Ambulatory Visit | Attending: Cardiology | Admitting: Cardiology

## 2014-03-29 DIAGNOSIS — I679 Cerebrovascular disease, unspecified: Secondary | ICD-10-CM | POA: Diagnosis not present

## 2014-03-29 DIAGNOSIS — I779 Disorder of arteries and arterioles, unspecified: Secondary | ICD-10-CM

## 2014-03-29 NOTE — Progress Notes (Signed)
Carotid Duplex Completed. Stable right ICA 50-69% stenosis.  Oda Cogan, BS, RDMS, RVT

## 2014-03-31 ENCOUNTER — Encounter: Payer: Self-pay | Admitting: Cardiology

## 2014-03-31 NOTE — Telephone Encounter (Signed)
This encounter was created in error - please disregard.

## 2014-03-31 NOTE — Telephone Encounter (Signed)
New Message ° ° ° ° ° °Patient is returning call please call back °

## 2014-04-05 ENCOUNTER — Encounter: Payer: Self-pay | Admitting: Physician Assistant

## 2014-04-05 ENCOUNTER — Ambulatory Visit (INDEPENDENT_AMBULATORY_CARE_PROVIDER_SITE_OTHER): Payer: Medicare Other | Admitting: Physician Assistant

## 2014-04-05 VITALS — BP 134/68 | HR 60 | Ht 74.0 in | Wt 224.4 lb

## 2014-04-05 DIAGNOSIS — N183 Chronic kidney disease, stage 3 unspecified: Secondary | ICD-10-CM

## 2014-04-05 DIAGNOSIS — I5022 Chronic systolic (congestive) heart failure: Secondary | ICD-10-CM

## 2014-04-05 DIAGNOSIS — E785 Hyperlipidemia, unspecified: Secondary | ICD-10-CM | POA: Diagnosis not present

## 2014-04-05 DIAGNOSIS — I2510 Atherosclerotic heart disease of native coronary artery without angina pectoris: Secondary | ICD-10-CM | POA: Diagnosis not present

## 2014-04-05 DIAGNOSIS — I255 Ischemic cardiomyopathy: Secondary | ICD-10-CM

## 2014-04-05 DIAGNOSIS — I10 Essential (primary) hypertension: Secondary | ICD-10-CM | POA: Diagnosis not present

## 2014-04-05 DIAGNOSIS — R635 Abnormal weight gain: Secondary | ICD-10-CM | POA: Diagnosis not present

## 2014-04-05 DIAGNOSIS — Z79899 Other long term (current) drug therapy: Secondary | ICD-10-CM

## 2014-04-05 DIAGNOSIS — R0602 Shortness of breath: Secondary | ICD-10-CM

## 2014-04-05 DIAGNOSIS — I2583 Coronary atherosclerosis due to lipid rich plaque: Secondary | ICD-10-CM

## 2014-04-05 DIAGNOSIS — I480 Paroxysmal atrial fibrillation: Secondary | ICD-10-CM | POA: Diagnosis not present

## 2014-04-05 LAB — BASIC METABOLIC PANEL
BUN: 24 mg/dL — AB (ref 6–23)
CALCIUM: 9 mg/dL (ref 8.4–10.5)
CO2: 33 meq/L — AB (ref 19–32)
CREATININE: 1.6 mg/dL — AB (ref 0.50–1.35)
Chloride: 103 mEq/L (ref 96–112)
GLUCOSE: 165 mg/dL — AB (ref 70–99)
Potassium: 3.6 mEq/L (ref 3.5–5.3)
Sodium: 144 mEq/L (ref 135–145)

## 2014-04-05 NOTE — Assessment & Plan Note (Signed)
Check BMET today 

## 2014-04-05 NOTE — Assessment & Plan Note (Signed)
TSH WNL
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
## 2014-04-05 NOTE — Assessment & Plan Note (Signed)
Rhythm reg on exam.  On eliquis, coreg and amio

## 2014-04-05 NOTE — Assessment & Plan Note (Signed)
He reports no angina

## 2014-04-05 NOTE — Patient Instructions (Signed)
Your physician recommends that you return for lab work in: TODAY - 1st floor, 1st hallway on right off elevator, [address]  Keep you follow up appointments as directed

## 2014-04-05 NOTE — Assessment & Plan Note (Signed)
His weight is still elevated from Dec but down a few pounds from 1/6.  Edema improved.  No orthopnea.  Continue current dose of lasix.  BMET today.

## 2014-04-05 NOTE — Assessment & Plan Note (Signed)
Improved

## 2014-04-05 NOTE — Assessment & Plan Note (Signed)
Continue statin. 

## 2014-04-05 NOTE — Assessment & Plan Note (Addendum)
BP stable.  No changes in therapy

## 2014-04-12 ENCOUNTER — Other Ambulatory Visit: Payer: Self-pay | Admitting: Internal Medicine

## 2014-04-13 NOTE — Telephone Encounter (Signed)
Sent to the pharmacy by e-scribe. 

## 2014-04-27 ENCOUNTER — Inpatient Hospital Stay (HOSPITAL_COMMUNITY)
Admission: EM | Admit: 2014-04-27 | Discharge: 2014-04-30 | DRG: 292 | Disposition: A | Discharge status: Home / Self Care | Payer: Medicare Other | Attending: Internal Medicine | Admitting: Internal Medicine

## 2014-04-27 ENCOUNTER — Encounter (HOSPITAL_COMMUNITY): Payer: Self-pay | Admitting: *Deleted

## 2014-04-27 ENCOUNTER — Telehealth: Payer: Self-pay | Admitting: Internal Medicine

## 2014-04-27 ENCOUNTER — Emergency Department (HOSPITAL_COMMUNITY): Payer: Medicare Other

## 2014-04-27 DIAGNOSIS — Z9049 Acquired absence of other specified parts of digestive tract: Secondary | ICD-10-CM | POA: Diagnosis present

## 2014-04-27 DIAGNOSIS — Z9841 Cataract extraction status, right eye: Secondary | ICD-10-CM

## 2014-04-27 DIAGNOSIS — R0602 Shortness of breath: Secondary | ICD-10-CM | POA: Diagnosis not present

## 2014-04-27 DIAGNOSIS — E559 Vitamin D deficiency, unspecified: Secondary | ICD-10-CM | POA: Diagnosis not present

## 2014-04-27 DIAGNOSIS — I10 Essential (primary) hypertension: Secondary | ICD-10-CM | POA: Diagnosis present

## 2014-04-27 DIAGNOSIS — I5023 Acute on chronic systolic (congestive) heart failure: Secondary | ICD-10-CM

## 2014-04-27 DIAGNOSIS — I480 Paroxysmal atrial fibrillation: Secondary | ICD-10-CM | POA: Diagnosis present

## 2014-04-27 DIAGNOSIS — R635 Abnormal weight gain: Secondary | ICD-10-CM

## 2014-04-27 DIAGNOSIS — E119 Type 2 diabetes mellitus without complications: Secondary | ICD-10-CM | POA: Diagnosis not present

## 2014-04-27 DIAGNOSIS — N184 Chronic kidney disease, stage 4 (severe): Secondary | ICD-10-CM | POA: Diagnosis not present

## 2014-04-27 DIAGNOSIS — Z951 Presence of aortocoronary bypass graft: Secondary | ICD-10-CM | POA: Diagnosis not present

## 2014-04-27 DIAGNOSIS — N179 Acute kidney failure, unspecified: Secondary | ICD-10-CM | POA: Diagnosis not present

## 2014-04-27 DIAGNOSIS — I129 Hypertensive chronic kidney disease with stage 1 through stage 4 chronic kidney disease, or unspecified chronic kidney disease: Secondary | ICD-10-CM | POA: Diagnosis present

## 2014-04-27 DIAGNOSIS — F329 Major depressive disorder, single episode, unspecified: Secondary | ICD-10-CM | POA: Diagnosis present

## 2014-04-27 DIAGNOSIS — I509 Heart failure, unspecified: Secondary | ICD-10-CM | POA: Diagnosis not present

## 2014-04-27 DIAGNOSIS — I5043 Acute on chronic combined systolic (congestive) and diastolic (congestive) heart failure: Secondary | ICD-10-CM | POA: Diagnosis not present

## 2014-04-27 DIAGNOSIS — Z9842 Cataract extraction status, left eye: Secondary | ICD-10-CM

## 2014-04-27 DIAGNOSIS — I255 Ischemic cardiomyopathy: Secondary | ICD-10-CM | POA: Diagnosis present

## 2014-04-27 DIAGNOSIS — I5022 Chronic systolic (congestive) heart failure: Secondary | ICD-10-CM | POA: Diagnosis present

## 2014-04-27 DIAGNOSIS — M1990 Unspecified osteoarthritis, unspecified site: Secondary | ICD-10-CM | POA: Diagnosis present

## 2014-04-27 DIAGNOSIS — L03116 Cellulitis of left lower limb: Secondary | ICD-10-CM

## 2014-04-27 DIAGNOSIS — I2510 Atherosclerotic heart disease of native coronary artery without angina pectoris: Secondary | ICD-10-CM | POA: Diagnosis present

## 2014-04-27 DIAGNOSIS — G4733 Obstructive sleep apnea (adult) (pediatric): Secondary | ICD-10-CM | POA: Diagnosis present

## 2014-04-27 DIAGNOSIS — N189 Chronic kidney disease, unspecified: Secondary | ICD-10-CM | POA: Diagnosis present

## 2014-04-27 DIAGNOSIS — Z95810 Presence of automatic (implantable) cardiac defibrillator: Secondary | ICD-10-CM

## 2014-04-27 DIAGNOSIS — G2581 Restless legs syndrome: Secondary | ICD-10-CM | POA: Diagnosis present

## 2014-04-27 DIAGNOSIS — D649 Anemia, unspecified: Secondary | ICD-10-CM | POA: Diagnosis present

## 2014-04-27 DIAGNOSIS — I252 Old myocardial infarction: Secondary | ICD-10-CM

## 2014-04-27 DIAGNOSIS — Z87891 Personal history of nicotine dependence: Secondary | ICD-10-CM

## 2014-04-27 DIAGNOSIS — E785 Hyperlipidemia, unspecified: Secondary | ICD-10-CM | POA: Diagnosis present

## 2014-04-27 DIAGNOSIS — Z794 Long term (current) use of insulin: Secondary | ICD-10-CM

## 2014-04-27 DIAGNOSIS — Z950 Presence of cardiac pacemaker: Secondary | ICD-10-CM | POA: Diagnosis not present

## 2014-04-27 DIAGNOSIS — D72829 Elevated white blood cell count, unspecified: Secondary | ICD-10-CM | POA: Diagnosis present

## 2014-04-27 LAB — CBC
HCT: 40.8 % (ref 39.0–52.0)
HEMOGLOBIN: 13 g/dL (ref 13.0–17.0)
MCH: 30.4 pg (ref 26.0–34.0)
MCHC: 31.9 g/dL (ref 30.0–36.0)
MCV: 95.3 fL (ref 78.0–100.0)
PLATELETS: 161 10*3/uL (ref 150–400)
RBC: 4.28 MIL/uL (ref 4.22–5.81)
RDW: 13.6 % (ref 11.5–15.5)
WBC: 10.4 10*3/uL (ref 4.0–10.5)

## 2014-04-27 LAB — BASIC METABOLIC PANEL
Anion gap: 12 (ref 5–15)
BUN: 29 mg/dL — ABNORMAL HIGH (ref 6–23)
CO2: 27 mmol/L (ref 19–32)
Calcium: 9.3 mg/dL (ref 8.4–10.5)
Chloride: 104 mmol/L (ref 96–112)
Creatinine, Ser: 1.94 mg/dL — ABNORMAL HIGH (ref 0.50–1.35)
GFR calc Af Amer: 35 mL/min — ABNORMAL LOW (ref >90)
GFR calc non Af Amer: 30 mL/min — ABNORMAL LOW (ref >90)
GLUCOSE: 203 mg/dL — AB (ref 70–99)
Potassium: 3.5 mmol/L (ref 3.5–5.1)
Sodium: 143 mmol/L (ref 135–145)

## 2014-04-27 LAB — I-STAT TROPONIN, ED: TROPONIN I, POC: 0.02 ng/mL (ref 0.00–0.08)

## 2014-04-27 LAB — BRAIN NATRIURETIC PEPTIDE: B Natriuretic Peptide: 1434.8 pg/mL — ABNORMAL HIGH (ref 0.0–100.0)

## 2014-04-27 MED ORDER — LORAZEPAM 2 MG/ML IJ SOLN: 1.0000 mg | Freq: Once | INTRAMUSCULAR | Status: DC

## 2014-04-27 MED ORDER — LABETALOL HCL 5 MG/ML IV SOLN: 20.0000 mg | Freq: Once | INTRAVENOUS | Status: DC

## 2014-04-27 MED ORDER — HYDROMORPHONE HCL 1 MG/ML IJ SOLN: 1.0000 mg | Freq: Once | INTRAMUSCULAR | Status: DC

## 2014-04-27 MED ORDER — FUROSEMIDE 10 MG/ML IJ SOLN
80.0000 mg | Freq: Once | INTRAMUSCULAR | Status: AC
  Administered 2014-04-27: 80 mg via INTRAVENOUS
  Filled 2014-04-27: qty 8

## 2014-04-27 NOTE — ED Notes (Signed)
Pt reports sob, states its been going on for months but in the past few weeks it has gotten worse. Pt states his sob has increased with exertion, and his legs have been weak. Pt has swelling to both legs, left more so than right. Pitting edema. Pulses present in both feet.

## 2014-04-27 NOTE — ED Notes (Signed)
No pain just sob with exertion
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
## 2014-04-27 NOTE — ED Notes (Signed)
Pt reports progressively worsening SOB over the last couple days. States that he called his doctor and was told to come to ED. Pt reports weight gain. Denies chest pain. Hx of CHF.

## 2014-04-27 NOTE — Telephone Encounter (Signed)
Patient Name: [name]  DOB: 09-Jun-1928    Initial Comment Caller states he is having shortness of breath.    Nurse Assessment  Nurse: Thad Ranger RN, Denise Date/Time (Eastern Time): 04/27/2014 4:08:11 PM  Confirm and document reason for call. If symptomatic, describe symptoms. ---Caller states he is having shortness of breath and it is getting much worse. States he is not taking any meds for breathing and has a hx of A-Fib, CHF. He is not on O2. Denies dizziness, CP.  Has the patient traveled out of the country within the last 30 days? ---Not Applicable  Does the patient require triage? ---Yes  Related visit to physician within the last 2 weeks? ---No  Does the PT have any chronic conditions? (i.e. diabetes, asthma, etc.) ---Yes  List chronic conditions. ---A-Fib, CHF, DM, anticoag tx     Guidelines    Guideline Title Affirmed Question Affirmed Notes  Breathing Difficulty [1] MODERATE difficulty breathing (e.g., speaks in phrases, SOB even at rest, pulse 100-120) AND [2] NEW-onset or WORSE than normal Significant hx of heart dx and increase SOB w/activity. Erring on side of caution.   Final Disposition User   Go to ED Now [place], RN, [employer]

## 2014-04-28 ENCOUNTER — Encounter (HOSPITAL_COMMUNITY): Payer: Self-pay | Admitting: Internal Medicine

## 2014-04-28 DIAGNOSIS — E785 Hyperlipidemia, unspecified: Secondary | ICD-10-CM

## 2014-04-28 DIAGNOSIS — G4733 Obstructive sleep apnea (adult) (pediatric): Secondary | ICD-10-CM | POA: Diagnosis present

## 2014-04-28 DIAGNOSIS — I480 Paroxysmal atrial fibrillation: Secondary | ICD-10-CM | POA: Diagnosis present

## 2014-04-28 DIAGNOSIS — N183 Chronic kidney disease, stage 3 (moderate): Secondary | ICD-10-CM

## 2014-04-28 DIAGNOSIS — Z951 Presence of aortocoronary bypass graft: Secondary | ICD-10-CM | POA: Diagnosis not present

## 2014-04-28 DIAGNOSIS — M1990 Unspecified osteoarthritis, unspecified site: Secondary | ICD-10-CM | POA: Diagnosis present

## 2014-04-28 DIAGNOSIS — N189 Chronic kidney disease, unspecified: Secondary | ICD-10-CM | POA: Diagnosis present

## 2014-04-28 DIAGNOSIS — D649 Anemia, unspecified: Secondary | ICD-10-CM | POA: Diagnosis present

## 2014-04-28 DIAGNOSIS — I5023 Acute on chronic systolic (congestive) heart failure: Secondary | ICD-10-CM | POA: Diagnosis not present

## 2014-04-28 DIAGNOSIS — I509 Heart failure, unspecified: Secondary | ICD-10-CM | POA: Diagnosis not present

## 2014-04-28 DIAGNOSIS — Z9841 Cataract extraction status, right eye: Secondary | ICD-10-CM | POA: Diagnosis not present

## 2014-04-28 DIAGNOSIS — Z9049 Acquired absence of other specified parts of digestive tract: Secondary | ICD-10-CM | POA: Diagnosis present

## 2014-04-28 DIAGNOSIS — Z9842 Cataract extraction status, left eye: Secondary | ICD-10-CM | POA: Diagnosis not present

## 2014-04-28 DIAGNOSIS — Z95810 Presence of automatic (implantable) cardiac defibrillator: Secondary | ICD-10-CM | POA: Diagnosis not present

## 2014-04-28 DIAGNOSIS — N179 Acute kidney failure, unspecified: Secondary | ICD-10-CM | POA: Diagnosis present

## 2014-04-28 DIAGNOSIS — I252 Old myocardial infarction: Secondary | ICD-10-CM | POA: Diagnosis not present

## 2014-04-28 DIAGNOSIS — I129 Hypertensive chronic kidney disease with stage 1 through stage 4 chronic kidney disease, or unspecified chronic kidney disease: Secondary | ICD-10-CM | POA: Diagnosis present

## 2014-04-28 DIAGNOSIS — R0602 Shortness of breath: Secondary | ICD-10-CM | POA: Diagnosis not present

## 2014-04-28 DIAGNOSIS — G2581 Restless legs syndrome: Secondary | ICD-10-CM | POA: Diagnosis present

## 2014-04-28 DIAGNOSIS — N184 Chronic kidney disease, stage 4 (severe): Secondary | ICD-10-CM | POA: Diagnosis present

## 2014-04-28 DIAGNOSIS — Z87891 Personal history of nicotine dependence: Secondary | ICD-10-CM | POA: Diagnosis not present

## 2014-04-28 DIAGNOSIS — I255 Ischemic cardiomyopathy: Secondary | ICD-10-CM | POA: Diagnosis present

## 2014-04-28 DIAGNOSIS — I5043 Acute on chronic combined systolic (congestive) and diastolic (congestive) heart failure: Secondary | ICD-10-CM | POA: Diagnosis present

## 2014-04-28 DIAGNOSIS — E119 Type 2 diabetes mellitus without complications: Secondary | ICD-10-CM

## 2014-04-28 DIAGNOSIS — D72829 Elevated white blood cell count, unspecified: Secondary | ICD-10-CM | POA: Diagnosis present

## 2014-04-28 DIAGNOSIS — I2510 Atherosclerotic heart disease of native coronary artery without angina pectoris: Secondary | ICD-10-CM | POA: Diagnosis present

## 2014-04-28 DIAGNOSIS — I5022 Chronic systolic (congestive) heart failure: Secondary | ICD-10-CM | POA: Diagnosis not present

## 2014-04-28 DIAGNOSIS — Z794 Long term (current) use of insulin: Secondary | ICD-10-CM | POA: Diagnosis not present

## 2014-04-28 DIAGNOSIS — M7989 Other specified soft tissue disorders: Secondary | ICD-10-CM | POA: Diagnosis not present

## 2014-04-28 DIAGNOSIS — F329 Major depressive disorder, single episode, unspecified: Secondary | ICD-10-CM | POA: Diagnosis present

## 2014-04-28 DIAGNOSIS — E559 Vitamin D deficiency, unspecified: Secondary | ICD-10-CM | POA: Diagnosis present

## 2014-04-28 LAB — COMPREHENSIVE METABOLIC PANEL
ALK PHOS: 66 U/L (ref 39–117)
ALT: 25 U/L (ref 0–53)
AST: 24 U/L (ref 0–37)
Albumin: 3.5 g/dL (ref 3.5–5.2)
Anion gap: 10 (ref 5–15)
BUN: 27 mg/dL — AB (ref 6–23)
CHLORIDE: 105 mmol/L (ref 96–112)
CO2: 32 mmol/L (ref 19–32)
CREATININE: 1.95 mg/dL — AB (ref 0.50–1.35)
Calcium: 9.4 mg/dL (ref 8.4–10.5)
GFR calc non Af Amer: 30 mL/min — ABNORMAL LOW (ref >90)
GFR, EST AFRICAN AMERICAN: 35 mL/min — AB (ref >90)
Glucose, Bld: 146 mg/dL — ABNORMAL HIGH (ref 70–99)
POTASSIUM: 3.8 mmol/L (ref 3.5–5.1)
Sodium: 147 mmol/L — ABNORMAL HIGH (ref 135–145)
Total Bilirubin: 0.9 mg/dL (ref 0.3–1.2)
Total Protein: 6.3 g/dL (ref 6.0–8.3)

## 2014-04-28 LAB — CBC WITH DIFFERENTIAL/PLATELET
Basophils Absolute: 0 10*3/uL (ref 0.0–0.1)
Basophils Relative: 0 % (ref 0–1)
EOS ABS: 0.1 10*3/uL (ref 0.0–0.7)
EOS PCT: 1 % (ref 0–5)
HCT: 41.4 % (ref 39.0–52.0)
HEMOGLOBIN: 13 g/dL (ref 13.0–17.0)
Lymphocytes Relative: 15 % (ref 12–46)
Lymphs Abs: 2.4 10*3/uL (ref 0.7–4.0)
MCH: 30 pg (ref 26.0–34.0)
MCHC: 31.4 g/dL (ref 30.0–36.0)
MCV: 95.6 fL (ref 78.0–100.0)
MONO ABS: 1 10*3/uL (ref 0.1–1.0)
MONOS PCT: 6 % (ref 3–12)
Neutro Abs: 12.1 10*3/uL — ABNORMAL HIGH (ref 1.7–7.7)
Neutrophils Relative %: 78 % — ABNORMAL HIGH (ref 43–77)
Platelets: 158 10*3/uL (ref 150–400)
RBC: 4.33 MIL/uL (ref 4.22–5.81)
RDW: 13.5 % (ref 11.5–15.5)
WBC: 15.5 10*3/uL — AB (ref 4.0–10.5)

## 2014-04-28 LAB — GLUCOSE, CAPILLARY
GLUCOSE-CAPILLARY: 105 mg/dL — AB (ref 70–99)
GLUCOSE-CAPILLARY: 144 mg/dL — AB (ref 70–99)
GLUCOSE-CAPILLARY: 90 mg/dL (ref 70–99)
Glucose-Capillary: 140 mg/dL — ABNORMAL HIGH (ref 70–99)

## 2014-04-28 LAB — TSH: TSH: 2.252 u[IU]/mL (ref 0.350–4.500)

## 2014-04-28 LAB — CBG MONITORING, ED: GLUCOSE-CAPILLARY: 143 mg/dL — AB (ref 70–99)

## 2014-04-28 MED ORDER — TAMSULOSIN HCL 0.4 MG PO CAPS
0.4000 mg | ORAL_CAPSULE | Freq: Every day | ORAL | Status: DC
  Administered 2014-04-28 – 2014-04-29 (×2): 0.4 mg via ORAL
  Filled 2014-04-28 (×3): qty 1

## 2014-04-28 MED ORDER — INSULIN DETEMIR 100 UNIT/ML ~~LOC~~ SOLN
18.0000 [IU] | Freq: Every day | SUBCUTANEOUS | Status: DC
  Administered 2014-04-28 (×2): 18 [IU] via SUBCUTANEOUS
  Filled 2014-04-28 (×3): qty 0.18

## 2014-04-28 MED ORDER — ONDANSETRON HCL 4 MG/2ML IJ SOLN: 4.0000 mg | Freq: Four times a day (QID) | INTRAMUSCULAR | Status: DC | PRN

## 2014-04-28 MED ORDER — INSULIN ASPART 100 UNIT/ML ~~LOC~~ SOLN
0.0000 [IU] | Freq: Three times a day (TID) | SUBCUTANEOUS | Status: DC
  Administered 2014-04-28: 1 [IU] via SUBCUTANEOUS
  Administered 2014-04-29: 2 [IU] via SUBCUTANEOUS

## 2014-04-28 MED ORDER — CARVEDILOL 25 MG PO TABS
25.0000 mg | ORAL_TABLET | Freq: Two times a day (BID) | ORAL | Status: DC
  Administered 2014-04-28 – 2014-04-30 (×5): 25 mg via ORAL
  Filled 2014-04-28 (×7): qty 1

## 2014-04-28 MED ORDER — ACETAMINOPHEN 325 MG PO TABS: 650.0000 mg | ORAL_TABLET | Freq: Four times a day (QID) | ORAL | Status: DC | PRN

## 2014-04-28 MED ORDER — LEVOFLOXACIN IN D5W 750 MG/150ML IV SOLN
750.0000 mg | INTRAVENOUS | Status: DC
  Administered 2014-04-28: 750 mg via INTRAVENOUS
  Filled 2014-04-28: qty 150

## 2014-04-28 MED ORDER — RAMIPRIL 2.5 MG PO CAPS
2.5000 mg | ORAL_CAPSULE | Freq: Two times a day (BID) | ORAL | Status: DC
  Administered 2014-04-28 – 2014-04-30 (×6): 2.5 mg via ORAL
  Filled 2014-04-28 (×8): qty 1

## 2014-04-28 MED ORDER — AMIODARONE HCL 200 MG PO TABS
200.0000 mg | ORAL_TABLET | Freq: Every day | ORAL | Status: DC
  Administered 2014-04-28 – 2014-04-30 (×3): 200 mg via ORAL
  Filled 2014-04-28 (×3): qty 1

## 2014-04-28 MED ORDER — SODIUM CHLORIDE 0.9 % IJ SOLN
3.0000 mL | Freq: Two times a day (BID) | INTRAMUSCULAR | Status: DC
  Administered 2014-04-28 – 2014-04-30 (×4): 3 mL via INTRAVENOUS

## 2014-04-28 MED ORDER — LINAGLIPTIN 5 MG PO TABS
5.0000 mg | ORAL_TABLET | Freq: Every day | ORAL | Status: DC
  Administered 2014-04-28 – 2014-04-30 (×3): 5 mg via ORAL
  Filled 2014-04-28 (×5): qty 1

## 2014-04-28 MED ORDER — PRAMIPEXOLE DIHYDROCHLORIDE 0.25 MG PO TABS
0.5000 mg | ORAL_TABLET | Freq: Every day | ORAL | Status: DC
  Administered 2014-04-28 – 2014-04-30 (×3): 0.5 mg via ORAL
  Filled 2014-04-28 (×4): qty 2

## 2014-04-28 MED ORDER — ONDANSETRON HCL 4 MG PO TABS: 4.0000 mg | ORAL_TABLET | Freq: Four times a day (QID) | ORAL | Status: DC | PRN

## 2014-04-28 MED ORDER — GLIPIZIDE 10 MG PO TABS
10.0000 mg | ORAL_TABLET | Freq: Two times a day (BID) | ORAL | Status: DC
  Administered 2014-04-28 – 2014-04-29 (×3): 10 mg via ORAL
  Filled 2014-04-28 (×5): qty 1

## 2014-04-28 MED ORDER — ACETAMINOPHEN 650 MG RE SUPP: 650.0000 mg | Freq: Four times a day (QID) | RECTAL | Status: DC | PRN

## 2014-04-28 MED ORDER — SODIUM CHLORIDE 0.9 % IJ SOLN
3.0000 mL | Freq: Two times a day (BID) | INTRAMUSCULAR | Status: DC
  Administered 2014-04-28 – 2014-04-30 (×2): 3 mL via INTRAVENOUS

## 2014-04-28 MED ORDER — ATORVASTATIN CALCIUM 40 MG PO TABS
40.0000 mg | ORAL_TABLET | Freq: Every day | ORAL | Status: DC
  Administered 2014-04-28 – 2014-04-30 (×3): 40 mg via ORAL
  Filled 2014-04-28 (×3): qty 1

## 2014-04-28 MED ORDER — ESCITALOPRAM OXALATE 10 MG PO TABS
10.0000 mg | ORAL_TABLET | Freq: Every day | ORAL | Status: DC
  Administered 2014-04-28 – 2014-04-30 (×3): 10 mg via ORAL
  Filled 2014-04-28 (×3): qty 1

## 2014-04-28 MED ORDER — APIXABAN 2.5 MG PO TABS
2.5000 mg | ORAL_TABLET | Freq: Two times a day (BID) | ORAL | Status: DC
  Administered 2014-04-28 – 2014-04-30 (×6): 2.5 mg via ORAL
  Filled 2014-04-28 (×8): qty 1

## 2014-04-28 MED ORDER — FUROSEMIDE 10 MG/ML IJ SOLN
80.0000 mg | Freq: Two times a day (BID) | INTRAMUSCULAR | Status: DC
  Administered 2014-04-28: 80 mg via INTRAVENOUS
  Filled 2014-04-28 (×3): qty 8

## 2014-04-28 MED ORDER — FUROSEMIDE 10 MG/ML IJ SOLN
80.0000 mg | Freq: Every day | INTRAMUSCULAR | Status: DC
  Administered 2014-04-29 – 2014-04-30 (×2): 80 mg via INTRAVENOUS
  Filled 2014-04-28 (×3): qty 8

## 2014-04-28 NOTE — Progress Notes (Signed)
Patient arrived to floor via stretcher and was transported to room 3e12. Currently patient is in bed and complains of no signs or symptoms of discomfort, difficulty breathing, or adverse reaction. Will continue to monitor patient to end of shift.

## 2014-04-28 NOTE — Progress Notes (Signed)
  Echocardiogram 2D Echocardiogram has been performed.  [name] 04/28/2014, 10:14 AM

## 2014-04-28 NOTE — Plan of Care (Signed)
Problem: Phase I Progression Outcomes Goal: EF % per last Echo/documented,Core Reminder form on chart Last Echo was performed in 2011
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
## 2014-04-28 NOTE — Progress Notes (Signed)
I have seen and examined Mr [name] at bedside and reviewed his chart. [name] is a pleasant 81 year old male with chronic systolic CHF EF 0000000 who came in with worsening shortness of breath associated with bilateral leg weakness raising concern for acute decompensation of CHF. Chest x-ray suggested "mild CHF". He is getting diuretics. 2-D echocardiogram performed this morning showed that the ejection fraction has not changed. Patient also tells me that his primary care provider had increased the dose of Lasix in the last month and he continues to avoid. Labs today suggest possible infection as his white count is 15,500, and the urine/creatinine has bumped up some with creatinine of 1.95 from a baseline of 1.6 last month. Question is whether current presentation is related to acute decompensation of CHF especially as his BNP is only 1400 or is it a combination of infection/CHF decompensation or infection alone. Patient was treated for cellulitis last month. We'll and Levaquin adjusted for creatinine clearance and consult cardiology in a.m. Also obtain UA/UC. Will continue rest of orders per Dr. Hal Hope.

## 2014-04-29 DIAGNOSIS — M7989 Other specified soft tissue disorders: Secondary | ICD-10-CM

## 2014-04-29 DIAGNOSIS — I5022 Chronic systolic (congestive) heart failure: Secondary | ICD-10-CM

## 2014-04-29 LAB — CBC
HCT: 38.4 % — ABNORMAL LOW (ref 39.0–52.0)
Hemoglobin: 12 g/dL — ABNORMAL LOW (ref 13.0–17.0)
MCH: 30.5 pg (ref 26.0–34.0)
MCHC: 31.3 g/dL (ref 30.0–36.0)
MCV: 97.5 fL (ref 78.0–100.0)
Platelets: 137 10*3/uL — ABNORMAL LOW (ref 150–400)
RBC: 3.94 MIL/uL — AB (ref 4.22–5.81)
RDW: 13.5 % (ref 11.5–15.5)
WBC: 9.5 10*3/uL (ref 4.0–10.5)

## 2014-04-29 LAB — COMPREHENSIVE METABOLIC PANEL
ALK PHOS: 52 U/L (ref 39–117)
ALT: 19 U/L (ref 0–53)
AST: 17 U/L (ref 0–37)
Albumin: 2.7 g/dL — ABNORMAL LOW (ref 3.5–5.2)
Anion gap: 6 (ref 5–15)
BILIRUBIN TOTAL: 0.8 mg/dL (ref 0.3–1.2)
BUN: 28 mg/dL — ABNORMAL HIGH (ref 6–23)
CALCIUM: 8.7 mg/dL (ref 8.4–10.5)
CO2: 35 mmol/L — AB (ref 19–32)
Chloride: 102 mmol/L (ref 96–112)
Creatinine, Ser: 1.74 mg/dL — ABNORMAL HIGH (ref 0.50–1.35)
GFR calc non Af Amer: 35 mL/min — ABNORMAL LOW (ref >90)
GFR, EST AFRICAN AMERICAN: 40 mL/min — AB (ref >90)
Glucose, Bld: 46 mg/dL — ABNORMAL LOW (ref 70–99)
POTASSIUM: 3.2 mmol/L — AB (ref 3.5–5.1)
Sodium: 143 mmol/L (ref 135–145)
Total Protein: 5.2 g/dL — ABNORMAL LOW (ref 6.0–8.3)

## 2014-04-29 LAB — URINALYSIS, ROUTINE W REFLEX MICROSCOPIC
Bilirubin Urine: NEGATIVE
GLUCOSE, UA: NEGATIVE mg/dL
Hgb urine dipstick: NEGATIVE
KETONES UR: NEGATIVE mg/dL
LEUKOCYTES UA: NEGATIVE
Nitrite: NEGATIVE
PROTEIN: NEGATIVE mg/dL
Specific Gravity, Urine: 1.017 (ref 1.005–1.030)
Urobilinogen, UA: 0.2 mg/dL (ref 0.0–1.0)
pH: 5 (ref 5.0–8.0)

## 2014-04-29 LAB — GLUCOSE, CAPILLARY
GLUCOSE-CAPILLARY: 99 mg/dL (ref 70–99)
GLUCOSE-CAPILLARY: 78 mg/dL (ref 70–99)
Glucose-Capillary: 90 mg/dL (ref 70–99)
Glucose-Capillary: 172 mg/dL — ABNORMAL HIGH (ref 70–99)

## 2014-04-29 LAB — PHOSPHORUS: Phosphorus: 3.6 mg/dL (ref 2.3–4.6)

## 2014-04-29 LAB — BRAIN NATRIURETIC PEPTIDE: B Natriuretic Peptide: 1174.5 pg/mL — ABNORMAL HIGH (ref 0.0–100.0)

## 2014-04-29 LAB — MAGNESIUM: Magnesium: 2 mg/dL (ref 1.5–2.5)

## 2014-04-29 MED ORDER — GLIPIZIDE 5 MG PO TABS
5.0000 mg | ORAL_TABLET | Freq: Two times a day (BID) | ORAL | Status: DC
  Administered 2014-04-29 – 2014-04-30 (×2): 5 mg via ORAL
  Filled 2014-04-29 (×4): qty 1

## 2014-04-29 MED ORDER — DOCUSATE SODIUM 100 MG PO CAPS
200.0000 mg | ORAL_CAPSULE | Freq: Two times a day (BID) | ORAL | Status: DC
  Administered 2014-04-29 – 2014-04-30 (×3): 200 mg via ORAL
  Filled 2014-04-29 (×4): qty 2

## 2014-04-29 MED ORDER — LEVOFLOXACIN 750 MG PO TABS
750.0000 mg | ORAL_TABLET | ORAL | Status: DC
  Filled 2014-04-29: qty 1

## 2014-04-29 MED ORDER — INSULIN DETEMIR 100 UNIT/ML ~~LOC~~ SOLN
10.0000 [IU] | Freq: Every day | SUBCUTANEOUS | Status: DC
  Filled 2014-04-29 (×2): qty 0.1

## 2014-04-29 MED ORDER — POLYETHYLENE GLYCOL 3350 17 G PO PACK
17.0000 g | PACK | Freq: Two times a day (BID) | ORAL | Status: DC
  Administered 2014-04-29 – 2014-04-30 (×3): 17 g via ORAL
  Filled 2014-04-29 (×4): qty 1

## 2014-04-29 NOTE — Progress Notes (Signed)
Inpatient Diabetes Program Recommendations  AACE/ADA: New Consensus Statement on Inpatient Glycemic Control (2013)  Target Ranges:  Prepandial:   less than 140 mg/dL      Peak postprandial:   less than 180 mg/dL (1-2 hours)      Critically ill patients:  140 - 180 mg/dL   Reason for Visit: Hypoglycemia  Results for [name], [name] (MRN [id number]) as of 04/29/2014 12:35  Ref. Range 04/28/2014 02:40 04/29/2014 05:22  Glucose Latest Range: 70-99 mg/dL 146 (H) 46 (L)    Inpatient Diabetes Program Recommendations Insulin - Basal: Decrease Levemir to 12 units QHS Oral Agents: D/C tradjenta and glipizide while inpatient to avoid hypoglycemia  Note: Will follow. Thank you. Lorenda Peck, RD, LDN, CDE Inpatient Diabetes Coordinator [phone]

## 2014-04-29 NOTE — Progress Notes (Signed)
VASCULAR LAB PRELIMINARY  PRELIMINARY  PRELIMINARY  PRELIMINARY  Left lower extremity venous duplex completed.    Preliminary report:  Left:  No evidence of DVT, superficial thrombosis, or Baker's cyst.  Kerstie Agent, RVS 04/29/2014, 3:56 PM

## 2014-04-29 NOTE — Progress Notes (Signed)
Patient Demographics  [name], is a 81 y.o. male, DOB - [birth year], [id number]  Admit date - 04/27/2014   Admitting Physician Rise Patience, MD  Outpatient Primary MD for the patient is Lottie Dawson, MD  LOS - 1   Chief Complaint  Patient presents with  . Shortness of Breath        Subjective:   [name] today has, No headache, No chest pain, No abdominal pain - No Nausea, No new weakness tingling or numbness, No Cough -  Improved SOB.    Assessment & Plan    1. 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 EF same as in previous echo 2011, has improved on IV Lasix, continue Coreg, ACE inhibitor. Fluids all restriction. Monitor weight.  Filed Weights   04/27/14 1721 04/28/14 0403 04/29/14 0537  Weight: 103.874 kg (229 lb) 100.653 kg (221 lb 14.4 oz) 100.744 kg (222 lb 1.6 oz)    2. Left more than right leg edema. Check venous ultrasound.   3. Dyslipidemia. Continue home dose statin.   4. Paroxysmal atrial fibrillation. Rate controlled continue beta blocker, amiodarone and xaralto.   5. ARF on chronic kidney disease stage 3-4. Baseline creatinine around 1.7. With diuresis he is at baseline now.   6. DM type II. AM CBG was low. Have cut down long-acting insulin and oral hypoglycemic. Sliding scale continue monitor.  CBG (last 3)   Recent Labs  04/28/14 2112 04/29/14 0621 04/29/14 1055  GLUCAP 140* 90 172*     Lab Results  Component Value Date   HGBA1C 5.7* 03/02/2014     7. Essential hypertension. Continue home medications unchanged.      Code Status: full  Family Communication: None present  Disposition Plan: Home   Procedures  TTE  - Left ventricle: There is diffuse hypokinesis with akinesis of the basal and  mid anteroseptal, anterior, anterolateral and apicalseptal and anterior walls. The cavity size was severely dilated. There was moderate concentric hypertrophy. Systolic function wasseverely reduced. The estimated ejection fraction was in therange of 15 to 20%. Features are consistent with a pseudonormal left ventricular filling pattern, with concomitant abnormal relaxation and increased filling pressure (grade 2 diastolic dysfunction). Doppler parameters are consistent with elevated ventricular end-diastolic filling pressure. - Ventricular septum: Septal motion showed paradox. - Aortic valve: Trileaflet; moderately thickened, moderately calcified leaflets. Cusp separation was moderately reduced. There was moderate regurgitation. - Aortic root: The aortic root was normal in size. - Mitral valve: Mildly thickened leaflets . There was moderateregurgitation. - Left atrium: The atrium was moderately dilated. - Right ventricle: Systolic function was moderately reduced. - Right atrium: The atrium was moderately to severely dilated. - Tricuspid valve: There was mild regurgitation. - Pulmonic valve: There was trivial regurgitation. - Pulmonary arteries: Systolic pressure was within the normal range. - Inferior vena cava: The vessel was normal in size. - Pericardium, extracardiac: There was no pericardial effusion.  Impressions:  - Compared to the study from 2011 the left ventricle is now severely dilated with severely decreased systolic function. Filling pressures are elevated. Right ventricular systolic function is moderately reduced.   Consults      Medications  Scheduled Meds: . amiodarone  200 mg Oral Daily  .  apixaban  2.5 mg Oral BID  . atorvastatin  40 mg Oral Daily  . carvedilol  25 mg Oral BID WC  . docusate sodium  200 mg Oral BID  . escitalopram  10 mg Oral Daily  . furosemide  80 mg Intravenous Daily  . glipiZIDE  10 mg Oral BID AC  . insulin aspart  0-9 Units  Subcutaneous TID WC  . insulin detemir  18 Units Subcutaneous QHS  . levofloxacin (LEVAQUIN) IV  750 mg Intravenous Q48H  . linagliptin  5 mg Oral Daily  . polyethylene glycol  17 g Oral BID  . pramipexole  0.5 mg Oral QHS  . ramipril  2.5 mg Oral BID  . sodium chloride  3 mL Intravenous Q12H  . sodium chloride  3 mL Intravenous Q12H  . tamsulosin  0.4 mg Oral QPC supper   Continuous Infusions:  PRN Meds:.acetaminophen **OR** acetaminophen, ondansetron **OR** ondansetron (ZOFRAN) IV  DVT Prophylaxis  Xaralto  Lab Results  Component Value Date   PLT 137* 04/29/2014    Antibiotics     Anti-infectives    Start     Dose/Rate Route Frequency Ordered Stop   04/28/14 1800  levofloxacin (LEVAQUIN) IVPB 750 mg     750 mg 100 mL/hr over 90 Minutes Intravenous Every 48 hours 04/28/14 1732 05/12/14 1759          Objective:   Filed Vitals:   04/28/14 1300 04/28/14 2027 04/29/14 0537 04/29/14 0857  BP: 118/53 119/60 116/59 110/62  Pulse: 60 57 60 66  Temp: 97.5 F (36.4 C) 97.5 F (36.4 C) 97.3 F (36.3 C) 98 F (36.7 C)  TempSrc: Oral Oral Oral Oral  Resp: 20 18 18    Height:      Weight:   100.744 kg (222 lb 1.6 oz)   SpO2: 98% 100% 99% 96%    Wt Readings from Last 3 Encounters:  04/29/14 100.744 kg (222 lb 1.6 oz)  04/05/14 101.787 kg (224 lb 6.4 oz)  03/24/14 103.783 kg (228 lb 12.8 oz)     Intake/Output Summary (Last 24 hours) at 04/29/14 1232 Last data filed at 04/29/14 1100  Gross per 24 hour  Intake   1475 ml  Output   1700 ml  Net   -225 ml     Physical Exam  Awake Alert, Oriented X 3, No new F.N deficits, Normal affect [place].AT,PERRAL Supple Neck,No JVD, No cervical lymphadenopathy appriciated.  Symmetrical Chest wall movement, Good air movement bilaterally, +ve rales RRR,No Gallops,Rubs or new Murmurs, No Parasternal Heave +ve B.Sounds, Abd Soft, No tenderness, No organomegaly appriciated, No rebound - guarding or rigidity. No Cyanosis, Clubbing ,  L>R leg edema, No new Rash or bruise      Data Review   Micro Results No results found for this or any previous visit (from the past 240 hour(s)).  Radiology Reports Dg Chest 2 View  04/27/2014   CLINICAL DATA:  Shortness of breath for 2 weeks  EXAM: CHEST  2 VIEW  COMPARISON:  03/25/2014  FINDINGS: Stable cardiomegaly and aortic tortuosity post CABG. Stable positioning of biventricular ICD/ pacer leads from the left. There is new interstitial opacity with [employer]. No pleural effusion or pneumonia. No pneumothorax.  IMPRESSION: Mild CHF.   Electronically Signed   By: Monte Fantasia M.D.   On: 04/27/2014 17:30     CBC  Recent Labs Lab 04/27/14 1718 04/28/14 0240 04/29/14 0926  WBC 10.4 15.5* 9.5  HGB 13.0 13.0 12.0*  HCT 40.8 41.4 38.4*  PLT 161 158 137*  MCV 95.3 95.6 97.5  MCH 30.4 30.0 30.5  MCHC 31.9 31.4 31.3  RDW 13.6 13.5 13.5  LYMPHSABS  --  2.4  --   MONOABS  --  1.0  --   EOSABS  --  0.1  --   BASOSABS  --  0.0  --     Chemistries   Recent Labs Lab 04/27/14 1718 04/28/14 0240 04/29/14 0522  NA 143 147* 143  K 3.5 3.8 3.2*  CL 104 105 102  CO2 27 32 35*  GLUCOSE 203* 146* 46*  BUN 29* 27* 28*  CREATININE 1.94* 1.95* 1.74*  CALCIUM 9.3 9.4 8.7  MG  --   --  2.0  AST  --  24 17  ALT  --  25 19  ALKPHOS  --  66 52  BILITOT  --  0.9 0.8   ------------------------------------------------------------------------------------------------------------------ estimated creatinine clearance is 40.8 mL/min (by C-G formula based on Cr of 1.74). ------------------------------------------------------------------------------------------------------------------ No results for input(s): HGBA1C in the last 72 hours. ------------------------------------------------------------------------------------------------------------------ No results for input(s): CHOL, HDL, LDLCALC, TRIG, CHOLHDL, LDLDIRECT in the last 72  hours. ------------------------------------------------------------------------------------------------------------------  Recent Labs  04/28/14 0240  TSH 2.252   ------------------------------------------------------------------------------------------------------------------ No results for input(s): VITAMINB12, FOLATE, FERRITIN, TIBC, IRON, RETICCTPCT in the last 72 hours.  Coagulation profile No results for input(s): INR, PROTIME in the last 168 hours.  No results for input(s): DDIMER in the last 72 hours.  Cardiac Enzymes No results for input(s): CKMB, TROPONINI, MYOGLOBIN in the last 168 hours.  Invalid input(s): CK ------------------------------------------------------------------------------------------------------------------ Invalid input(s): POCBNP     Time Spent in minutes   35   Kynnadi Dicenso K M.D on 04/29/2014 at 12:32 PM  Between 7am to 7pm - Pager - [phone]  After 7pm go to [email or web address] - [facility] Office  [phone]

## 2014-04-29 NOTE — Care Management Note (Signed)
    Page 1 of 1   04/30/2014     3:29:44 PM CARE MANAGEMENT NOTE 04/30/2014  Patient:  [name], [name]   Account Number:  [id number]  Date Initiated:  04/29/2014  Documentation initiated by:  Imari Reen  Subjective/Objective Assessment:   Pt adm on 04/27/14 with CHF.  PTA, pt independent, lives with spouse.     Action/Plan:   Will follow for dc needs as pt progresses.   Anticipated DC Date:  05/01/2014   Anticipated DC Plan:  [place]  CM consult      [facility] Choice  HOME HEALTH   Choice offered to / List presented to:  C-1 Patient        [place] arranged  HH-1 RN  [place] PT      [place] agency  [facility]   Status of service:  Completed, signed off Medicare Important Message given?  YES (If response is "NO", the following Medicare IM given date fields will be blank) Date Medicare IM given:  04/30/2014 Medicare IM given by:  Ameena Vesey Date Additional Medicare IM given:   Additional Medicare IM given by:    Discharge Disposition:  [place]  Per UR Regulation:  Reviewed for med. necessity/level of care/duration of stay  If discussed at [place] of Stay Meetings, dates discussed:    Comments:  04/30/14 Ellan Lambert, RN, BSN [phone] Pt for dc home today with spouse.  Will need HH follow up . Referral to [facility], per pt choice.  Start of care 24-48h post dc date.  Pt denies any DME needs.

## 2014-04-29 NOTE — Evaluation (Signed)
Physical Therapy Evaluation Patient Details Name: [name] MRN: [id number] DOB: November 25, 1928 Today's Date: 04/29/2014   History of Present Illness  KO BARDON is a 81 y.o. male with known history of CAD status post CABG, ischemic cardiomyopathy last EF measured was 25% in September 2011 and EF measured through stress Myoview in February 2015 was 19%, presents to the ER because of worsening shortness of breath over the last 3 days. Patient denies any associated productive cough fever chills or chest pain. Patient's shortness of breath is present in address increased on lying down and exertion. In the ER patient's chest x-ray shows congestion on exam patient has significant peripheral edema and elevated JVD. Patient was given Lasix 80 mg IV 1 dose and admitted for further management of decompensated CHF.  Clinical Impression  Although pt not back to baseline function and has decreased tolerance to activity, his sats stayed at least 90% on RA and he should be adequately safe with wife in his home.    Follow Up Recommendations Home health PT    Equipment Recommendations  None recommended by PT    Recommendations for Other Services       Precautions / Restrictions Precautions Precautions: Fall      Mobility  Bed Mobility Overal bed mobility: Independent                Transfers Overall transfer level: Needs assistance   Transfers: Sit to/from Stand Sit to Stand: Supervision         General transfer comment: safe technique  Ambulation/Gait Ambulation/Gait assistance: Supervision Ambulation Distance (Feet): 500 Feet Assistive device: None Gait Pattern/deviations: Step-through pattern Gait velocity: slower   General Gait Details: generally steady with progressively more dyspnea  Stairs Stairs: Yes Stairs assistance: Supervision Stair Management: One rail Right;Step to pattern;Alternating pattern;Forwards Number of Stairs: 4 General stair comments:  Discussed using to step to pattern due to less control of Left leg  Wheelchair Mobility    Modified Rankin (Stroke Patients Only)       Balance Overall balance assessment: Needs assistance Sitting-balance support: No upper extremity supported Sitting balance-Leahy Scale: Good     Standing balance support: No upper extremity supported Standing balance-Leahy Scale: Fair                               Pertinent Vitals/Pain Pain Assessment: No/denies pain    Home Living Family/patient expects to be discharged to:: Private residence Living Arrangements: Spouse/significant other Available Help at Discharge: Family;Available 24 hours/day Type of Home: House Home Access: Stairs to enter Entrance Stairs-Rails: Psychiatric nurse of Steps: 3 Home Layout: One level Home Equipment: Walker - 2 wheels;Cane - single point;Bedside commode;Shower seat      Prior Function Level of Independence: Independent               Hand Dominance        Extremity/Trunk Assessment               Lower Extremity Assessment: Overall WFL for tasks assessed;Generalized weakness (L LE generally weaker than the R LE)         Communication   Communication: No difficulties  Cognition Arousal/Alertness: Awake/alert Behavior During Therapy: WFL for tasks assessed/performed Overall Cognitive Status: Within Functional Limits for tasks assessed                      General Comments General comments (skin  integrity, edema, etc.): Oxygen sats during gait on RA were at 90% with EHR at 98bpm and moderate dyspnea.    Exercises        Assessment/Plan    PT Assessment All further PT needs can be met in the next venue of care  PT Diagnosis  (decreased activity tolerance)   PT Problem List Decreased activity tolerance;Decreased strength  PT Treatment Interventions     PT Goals (Current goals can be found in the Care Plan section) Acute Rehab PT  Goals PT Goal Formulation: All assessment and education complete, DC therapy    Frequency     Barriers to discharge        Co-evaluation               End of Session   Activity Tolerance: Patient tolerated treatment well Patient left: Other (comment);with family/visitor present;with call bell/phone within reach (sitting EOB) Nurse Communication: Mobility status         Time: 5646-9806 PT Time Calculation (min) (ACUTE ONLY): 20 min   Charges:   PT Evaluation $Initial PT Evaluation Tier I: 1 Procedure     PT G Codes:        Javyn Havlin, Tessie Fass 04/29/2014, 5:33 PM 04/29/2014  Donnella Sham, [place] [phone]  (pager)

## 2014-04-30 LAB — BASIC METABOLIC PANEL
ANION GAP: 8 (ref 5–15)
BUN: 31 mg/dL — ABNORMAL HIGH (ref 6–23)
CALCIUM: 8.7 mg/dL (ref 8.4–10.5)
CO2: 30 mmol/L (ref 19–32)
Chloride: 103 mmol/L (ref 96–112)
Creatinine, Ser: 1.78 mg/dL — ABNORMAL HIGH (ref 0.50–1.35)
GFR, EST AFRICAN AMERICAN: 39 mL/min — AB (ref >90)
GFR, EST NON AFRICAN AMERICAN: 34 mL/min — AB (ref >90)
Glucose, Bld: 107 mg/dL — ABNORMAL HIGH (ref 70–99)
Potassium: 3 mmol/L — ABNORMAL LOW (ref 3.5–5.1)
Sodium: 141 mmol/L (ref 135–145)

## 2014-04-30 LAB — GLUCOSE, CAPILLARY
GLUCOSE-CAPILLARY: 148 mg/dL — AB (ref 70–99)
Glucose-Capillary: 100 mg/dL — ABNORMAL HIGH (ref 70–99)

## 2014-04-30 LAB — URINE CULTURE
CULTURE: NO GROWTH
Colony Count: NO GROWTH

## 2014-04-30 MED ORDER — POTASSIUM CHLORIDE CRYS ER 20 MEQ PO TBCR: 20.0000 meq | EXTENDED_RELEASE_TABLET | Freq: Every day | ORAL | Status: DC

## 2014-04-30 MED ORDER — POTASSIUM CHLORIDE CRYS ER 20 MEQ PO TBCR
40.0000 meq | EXTENDED_RELEASE_TABLET | ORAL | Status: AC
  Administered 2014-04-30 (×2): 40 meq via ORAL
  Filled 2014-04-30 (×2): qty 2

## 2014-04-30 MED ORDER — DOXYCYCLINE HYCLATE 100 MG PO CAPS: 100.0000 mg | ORAL_CAPSULE | Freq: Two times a day (BID) | ORAL | Status: DC

## 2014-04-30 MED ORDER — LEVOFLOXACIN 750 MG PO TABS: 750.0000 mg | ORAL_TABLET | ORAL | Status: DC

## 2014-04-30 NOTE — Plan of Care (Signed)
Problem: Phase I Progression Outcomes Goal: EF % per last Echo/documented,Core Reminder form on chart Outcome: Completed/Met Date Met:  04/30/14 Echo performed on 04/28/2014 EF Result is 20-25%
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
## 2014-04-30 NOTE — Progress Notes (Signed)
Pt discharging via wheelchair, escorted by volunteer, accompanied by spouse. Discharge instructions and prescriptions provided to patient and spouse, verbalize understanding and able to provide appropriate teach back. PIV discontinued, site without s/s of complication. Tele box removed and returned to unit. Denies needs or questions at this time.

## 2014-04-30 NOTE — Discharge Instructions (Signed)
Follow with Primary MD Lottie Dawson, MD in 3-4 days   Get CBC, CMP, 2 view Chest X ray checked  by Primary MD next visit.    Activity: As tolerated with Full fall precautions use walker/cane & assistance as needed   Disposition Home     Diet: Heart Healthy Low Carb,  Check your Weight same time everyday, if you gain over 2 pounds, or you develop in leg swelling, experience more shortness of breath or chest pain, call your Primary MD immediately. Follow Cardiac Low Salt Diet and 1.2 lit/day fluid restriction.   On your next visit with your primary care physician please Get Medicines reviewed and adjusted.   Please request your Prim.MD to go over all Hospital Tests and Procedure/Radiological results at the follow up, please get all Hospital records sent to your Prim MD by signing hospital release before you go home.   If you experience worsening of your admission symptoms, develop shortness of breath, life threatening emergency, suicidal or homicidal thoughts you must seek medical attention immediately by calling 911 or calling your MD immediately  if symptoms less severe.  You Must read complete instructions/literature along with all the possible adverse reactions/side effects for all the Medicines you take and that have been prescribed to you. Take any new Medicines after you have completely understood and accpet all the possible adverse reactions/side effects.   Do not drive, operating heavy machinery, perform activities at heights, swimming or participation in water activities or provide baby sitting services if your were admitted for syncope or siezures until you have seen by Primary MD or a Neurologist and advised to do so again.  Do not drive when taking Pain medications.    Do not take more than prescribed Pain, Sleep and Anxiety Medications  Special Instructions: If you have smoked or chewed Tobacco  in the last 2 yrs please stop smoking, stop any regular Alcohol  and or  any Recreational drug use.  Wear Seat belts while driving.   Please note  You were cared for by a hospitalist during your hospital stay. If you have any questions about your discharge medications or the care you received while you were in the hospital after you are discharged, you can call the unit and asked to speak with the hospitalist on call if the hospitalist that took care of you is not available. Once you are discharged, your primary care physician will handle any further medical issues. Please note that NO REFILLS for any discharge medications will be authorized once you are discharged, as it is imperative that you return to your primary care physician (or establish a relationship with a primary care physician if you do not have one) for your aftercare needs so that they can reassess your need for medications and monitor your lab values.

## 2014-05-03 ENCOUNTER — Ambulatory Visit: Payer: Medicare Other | Admitting: Internal Medicine

## 2014-05-04 ENCOUNTER — Ambulatory Visit (INDEPENDENT_AMBULATORY_CARE_PROVIDER_SITE_OTHER): Payer: Medicare Other | Admitting: Cardiology

## 2014-05-04 ENCOUNTER — Encounter: Payer: Self-pay | Admitting: Cardiology

## 2014-05-04 VITALS — BP 120/60 | HR 60 | Ht 74.0 in | Wt 224.9 lb

## 2014-05-04 DIAGNOSIS — I25810 Atherosclerosis of coronary artery bypass graft(s) without angina pectoris: Secondary | ICD-10-CM

## 2014-05-04 DIAGNOSIS — I10 Essential (primary) hypertension: Secondary | ICD-10-CM | POA: Diagnosis not present

## 2014-05-04 DIAGNOSIS — I255 Ischemic cardiomyopathy: Secondary | ICD-10-CM | POA: Diagnosis not present

## 2014-05-04 DIAGNOSIS — I2589 Other forms of chronic ischemic heart disease: Secondary | ICD-10-CM | POA: Diagnosis not present

## 2014-05-04 DIAGNOSIS — N289 Disorder of kidney and ureter, unspecified: Secondary | ICD-10-CM

## 2014-05-04 DIAGNOSIS — I5022 Chronic systolic (congestive) heart failure: Secondary | ICD-10-CM | POA: Diagnosis not present

## 2014-05-04 DIAGNOSIS — I4891 Unspecified atrial fibrillation: Secondary | ICD-10-CM

## 2014-05-04 DIAGNOSIS — Z4502 Encounter for adjustment and management of automatic implantable cardiac defibrillator: Secondary | ICD-10-CM | POA: Diagnosis not present

## 2014-05-04 DIAGNOSIS — I480 Paroxysmal atrial fibrillation: Secondary | ICD-10-CM

## 2014-05-04 DIAGNOSIS — I679 Cerebrovascular disease, unspecified: Secondary | ICD-10-CM

## 2014-05-04 DIAGNOSIS — I2510 Atherosclerotic heart disease of native coronary artery without angina pectoris: Secondary | ICD-10-CM

## 2014-05-04 MED ORDER — FUROSEMIDE 40 MG PO TABS: 60.0000 mg | ORAL_TABLET | Freq: Two times a day (BID) | ORAL | Status: DC

## 2014-05-04 MED ORDER — APIXABAN 2.5 MG PO TABS: 2.5000 mg | ORAL_TABLET | Freq: Two times a day (BID) | ORAL | Status: DC

## 2014-05-04 MED ORDER — CARVEDILOL 25 MG PO TABS: 25.0000 mg | ORAL_TABLET | Freq: Two times a day (BID) | ORAL | Status: DC

## 2014-05-04 MED ORDER — RAMIPRIL 2.5 MG PO CAPS: 2.5000 mg | ORAL_CAPSULE | Freq: Two times a day (BID) | ORAL | Status: DC

## 2014-05-04 MED ORDER — ATORVASTATIN CALCIUM 40 MG PO TABS: 40.0000 mg | ORAL_TABLET | Freq: Every day | ORAL | Status: DC

## 2014-05-04 MED ORDER — AMIODARONE HCL 200 MG PO TABS: 200.0000 mg | ORAL_TABLET | Freq: Every day | ORAL | Status: DC
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
## 2014-05-04 NOTE — Patient Instructions (Signed)
Your physician recommends that you schedule a follow-up appointment in: AS SCHEDULED  

## 2014-05-05 DIAGNOSIS — I129 Hypertensive chronic kidney disease with stage 1 through stage 4 chronic kidney disease, or unspecified chronic kidney disease: Secondary | ICD-10-CM | POA: Diagnosis not present

## 2014-05-05 DIAGNOSIS — I5043 Acute on chronic combined systolic (congestive) and diastolic (congestive) heart failure: Secondary | ICD-10-CM | POA: Diagnosis not present

## 2014-05-05 DIAGNOSIS — I480 Paroxysmal atrial fibrillation: Secondary | ICD-10-CM | POA: Diagnosis not present

## 2014-05-05 DIAGNOSIS — E1140 Type 2 diabetes mellitus with diabetic neuropathy, unspecified: Secondary | ICD-10-CM | POA: Diagnosis not present

## 2014-05-05 DIAGNOSIS — I2510 Atherosclerotic heart disease of native coronary artery without angina pectoris: Secondary | ICD-10-CM | POA: Diagnosis not present

## 2014-05-05 DIAGNOSIS — N183 Chronic kidney disease, stage 3 (moderate): Secondary | ICD-10-CM | POA: Diagnosis not present

## 2014-05-05 LAB — BASIC METABOLIC PANEL WITH GFR
BUN: 36 mg/dL — ABNORMAL HIGH (ref 6–23)
CALCIUM: 8.8 mg/dL (ref 8.4–10.5)
CHLORIDE: 107 meq/L (ref 96–112)
CO2: 25 mEq/L (ref 19–32)
Creat: 1.84 mg/dL — ABNORMAL HIGH (ref 0.50–1.35)
GFR, EST NON AFRICAN AMERICAN: 33 mL/min — AB
GFR, Est African American: 38 mL/min — ABNORMAL LOW
Glucose, Bld: 107 mg/dL — ABNORMAL HIGH (ref 70–99)
Potassium: 4.2 mEq/L (ref 3.5–5.3)
SODIUM: 145 meq/L (ref 135–145)

## 2014-05-06 ENCOUNTER — Telehealth: Payer: Self-pay | Admitting: Internal Medicine

## 2014-05-06 ENCOUNTER — Other Ambulatory Visit: Payer: Self-pay | Admitting: *Deleted

## 2014-05-06 ENCOUNTER — Other Ambulatory Visit: Payer: Self-pay

## 2014-05-06 DIAGNOSIS — I5022 Chronic systolic (congestive) heart failure: Secondary | ICD-10-CM

## 2014-05-06 DIAGNOSIS — I255 Ischemic cardiomyopathy: Secondary | ICD-10-CM

## 2014-05-06 DIAGNOSIS — I679 Cerebrovascular disease, unspecified: Secondary | ICD-10-CM

## 2014-05-06 DIAGNOSIS — I25810 Atherosclerosis of coronary artery bypass graft(s) without angina pectoris: Secondary | ICD-10-CM

## 2014-05-06 DIAGNOSIS — E1140 Type 2 diabetes mellitus with diabetic neuropathy, unspecified: Secondary | ICD-10-CM | POA: Diagnosis not present

## 2014-05-06 DIAGNOSIS — I480 Paroxysmal atrial fibrillation: Secondary | ICD-10-CM

## 2014-05-06 DIAGNOSIS — I2510 Atherosclerotic heart disease of native coronary artery without angina pectoris: Secondary | ICD-10-CM | POA: Diagnosis not present

## 2014-05-06 DIAGNOSIS — N289 Disorder of kidney and ureter, unspecified: Secondary | ICD-10-CM

## 2014-05-06 DIAGNOSIS — Z4502 Encounter for adjustment and management of automatic implantable cardiac defibrillator: Secondary | ICD-10-CM

## 2014-05-06 DIAGNOSIS — I5043 Acute on chronic combined systolic (congestive) and diastolic (congestive) heart failure: Secondary | ICD-10-CM | POA: Diagnosis not present

## 2014-05-06 DIAGNOSIS — I4891 Unspecified atrial fibrillation: Secondary | ICD-10-CM

## 2014-05-06 DIAGNOSIS — I129 Hypertensive chronic kidney disease with stage 1 through stage 4 chronic kidney disease, or unspecified chronic kidney disease: Secondary | ICD-10-CM | POA: Diagnosis not present

## 2014-05-06 DIAGNOSIS — I10 Essential (primary) hypertension: Secondary | ICD-10-CM

## 2014-05-06 DIAGNOSIS — N183 Chronic kidney disease, stage 3 (moderate): Secondary | ICD-10-CM | POA: Diagnosis not present

## 2014-05-06 MED ORDER — AMIODARONE HCL 200 MG PO TABS: ORAL_TABLET | ORAL | Status: DC

## 2014-05-06 MED ORDER — FUROSEMIDE 40 MG PO TABS: 40.0000 mg | ORAL_TABLET | Freq: Two times a day (BID) | ORAL | Status: DC

## 2014-05-06 MED ORDER — FUROSEMIDE 40 MG PO TABS: 60.0000 mg | ORAL_TABLET | Freq: Two times a day (BID) | ORAL | Status: DC

## 2014-05-06 NOTE — Telephone Encounter (Signed)
Rx(s) sent to pharmacy electronically.  

## 2014-05-06 NOTE — Telephone Encounter (Signed)
[facility] needs verbal orders to continue skilled nursing, 2 week one/  3 week 2/   2 week 5 They want to do tele health monitoring daily and pt has agreed. Need orders for this as well  Ok to leave on laura's VM

## 2014-05-06 NOTE — Telephone Encounter (Signed)
Spoke to Terry.  Pt will be receiving nursing care and will also be weighed when receiving care.  They will do a pulse ox as well.  Informed to proceed.

## 2014-05-06 NOTE — Telephone Encounter (Signed)
  Any thing heart   Cardiac or chf related should go through cardiology team .sign off . I assume that is what the telemonitiring is related to heart failure dx and should come through them . Other orders such as  diabetics related  General pt   OT we can order .

## 2014-05-10 ENCOUNTER — Encounter: Payer: Self-pay | Admitting: Internal Medicine

## 2014-05-10 ENCOUNTER — Ambulatory Visit (INDEPENDENT_AMBULATORY_CARE_PROVIDER_SITE_OTHER): Payer: Medicare Other | Admitting: Internal Medicine

## 2014-05-10 VITALS — BP 118/56 | Temp 97.4°F | Ht 74.0 in | Wt 228.3 lb

## 2014-05-10 DIAGNOSIS — Z789 Other specified health status: Secondary | ICD-10-CM

## 2014-05-10 DIAGNOSIS — Z09 Encounter for follow-up examination after completed treatment for conditions other than malignant neoplasm: Secondary | ICD-10-CM | POA: Diagnosis not present

## 2014-05-10 DIAGNOSIS — E1140 Type 2 diabetes mellitus with diabetic neuropathy, unspecified: Secondary | ICD-10-CM

## 2014-05-10 DIAGNOSIS — I2510 Atherosclerotic heart disease of native coronary artery without angina pectoris: Secondary | ICD-10-CM | POA: Diagnosis not present

## 2014-05-10 DIAGNOSIS — I5043 Acute on chronic combined systolic (congestive) and diastolic (congestive) heart failure: Secondary | ICD-10-CM | POA: Diagnosis not present

## 2014-05-10 DIAGNOSIS — E785 Hyperlipidemia, unspecified: Secondary | ICD-10-CM

## 2014-05-10 DIAGNOSIS — N183 Chronic kidney disease, stage 3 (moderate): Secondary | ICD-10-CM | POA: Diagnosis not present

## 2014-05-10 DIAGNOSIS — Z79899 Other long term (current) drug therapy: Secondary | ICD-10-CM

## 2014-05-10 DIAGNOSIS — N289 Disorder of kidney and ureter, unspecified: Secondary | ICD-10-CM | POA: Diagnosis not present

## 2014-05-10 DIAGNOSIS — I255 Ischemic cardiomyopathy: Secondary | ICD-10-CM | POA: Diagnosis not present

## 2014-05-10 DIAGNOSIS — I5022 Chronic systolic (congestive) heart failure: Secondary | ICD-10-CM

## 2014-05-10 DIAGNOSIS — I129 Hypertensive chronic kidney disease with stage 1 through stage 4 chronic kidney disease, or unspecified chronic kidney disease: Secondary | ICD-10-CM | POA: Diagnosis not present

## 2014-05-10 DIAGNOSIS — I480 Paroxysmal atrial fibrillation: Secondary | ICD-10-CM | POA: Diagnosis not present

## 2014-05-10 DIAGNOSIS — Z87898 Personal history of other specified conditions: Secondary | ICD-10-CM | POA: Diagnosis not present

## 2014-05-10 MED ORDER — GLIPIZIDE 10 MG PO TABS: 10.0000 mg | ORAL_TABLET | Freq: Every day | ORAL | Status: DC
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
## 2014-05-10 NOTE — Patient Instructions (Signed)
To avoid getting too low of a blood sugar .  Decrease  the  Glipizide  tyo 10 mg  Per day . Continue checking BG every morning and as needed.  If Bg is going over 200 + contact us about advice .  Do not know if the weight  More is related to fluid or  Body weight .  ROV in 1-2 months    Lipid panel hga1c  cbcdiff and bmp pre visit if not done already

## 2014-05-11 ENCOUNTER — Telehealth: Payer: Self-pay | Admitting: Internal Medicine

## 2014-05-11 DIAGNOSIS — I5043 Acute on chronic combined systolic (congestive) and diastolic (congestive) heart failure: Secondary | ICD-10-CM | POA: Diagnosis not present

## 2014-05-11 DIAGNOSIS — N183 Chronic kidney disease, stage 3 (moderate): Secondary | ICD-10-CM | POA: Diagnosis not present

## 2014-05-11 DIAGNOSIS — I2510 Atherosclerotic heart disease of native coronary artery without angina pectoris: Secondary | ICD-10-CM | POA: Diagnosis not present

## 2014-05-11 DIAGNOSIS — I480 Paroxysmal atrial fibrillation: Secondary | ICD-10-CM | POA: Diagnosis not present

## 2014-05-11 DIAGNOSIS — I129 Hypertensive chronic kidney disease with stage 1 through stage 4 chronic kidney disease, or unspecified chronic kidney disease: Secondary | ICD-10-CM | POA: Diagnosis not present

## 2014-05-11 DIAGNOSIS — E1140 Type 2 diabetes mellitus with diabetic neuropathy, unspecified: Secondary | ICD-10-CM | POA: Diagnosis not present

## 2014-05-11 NOTE — Telephone Encounter (Signed)
Spoke to [name] and informed her to proceed with PT.  Cardiac issues should go through cardiology.

## 2014-05-11 NOTE — Telephone Encounter (Signed)
[name] is from [place] and is requesting verbal orders for Home Health PT 2 times a week for 6 weeks

## 2014-05-11 NOTE — Telephone Encounter (Signed)
Ok to do this  But   Any cardiac issues should go through cardiology.

## 2014-05-12 DIAGNOSIS — E1140 Type 2 diabetes mellitus with diabetic neuropathy, unspecified: Secondary | ICD-10-CM | POA: Diagnosis not present

## 2014-05-12 DIAGNOSIS — N183 Chronic kidney disease, stage 3 (moderate): Secondary | ICD-10-CM | POA: Diagnosis not present

## 2014-05-12 DIAGNOSIS — I129 Hypertensive chronic kidney disease with stage 1 through stage 4 chronic kidney disease, or unspecified chronic kidney disease: Secondary | ICD-10-CM | POA: Diagnosis not present

## 2014-05-12 DIAGNOSIS — I5043 Acute on chronic combined systolic (congestive) and diastolic (congestive) heart failure: Secondary | ICD-10-CM | POA: Diagnosis not present

## 2014-05-12 DIAGNOSIS — I2510 Atherosclerotic heart disease of native coronary artery without angina pectoris: Secondary | ICD-10-CM | POA: Diagnosis not present

## 2014-05-12 DIAGNOSIS — I480 Paroxysmal atrial fibrillation: Secondary | ICD-10-CM | POA: Diagnosis not present

## 2014-05-14 DIAGNOSIS — I480 Paroxysmal atrial fibrillation: Secondary | ICD-10-CM | POA: Diagnosis not present

## 2014-05-14 DIAGNOSIS — I2510 Atherosclerotic heart disease of native coronary artery without angina pectoris: Secondary | ICD-10-CM | POA: Diagnosis not present

## 2014-05-14 DIAGNOSIS — E1140 Type 2 diabetes mellitus with diabetic neuropathy, unspecified: Secondary | ICD-10-CM | POA: Diagnosis not present

## 2014-05-14 DIAGNOSIS — I129 Hypertensive chronic kidney disease with stage 1 through stage 4 chronic kidney disease, or unspecified chronic kidney disease: Secondary | ICD-10-CM | POA: Diagnosis not present

## 2014-05-14 DIAGNOSIS — I5043 Acute on chronic combined systolic (congestive) and diastolic (congestive) heart failure: Secondary | ICD-10-CM | POA: Diagnosis not present

## 2014-05-14 DIAGNOSIS — N183 Chronic kidney disease, stage 3 (moderate): Secondary | ICD-10-CM | POA: Diagnosis not present

## 2014-05-17 DIAGNOSIS — I129 Hypertensive chronic kidney disease with stage 1 through stage 4 chronic kidney disease, or unspecified chronic kidney disease: Secondary | ICD-10-CM | POA: Diagnosis not present

## 2014-05-17 DIAGNOSIS — I480 Paroxysmal atrial fibrillation: Secondary | ICD-10-CM | POA: Diagnosis not present

## 2014-05-17 DIAGNOSIS — I2510 Atherosclerotic heart disease of native coronary artery without angina pectoris: Secondary | ICD-10-CM | POA: Diagnosis not present

## 2014-05-17 DIAGNOSIS — E1140 Type 2 diabetes mellitus with diabetic neuropathy, unspecified: Secondary | ICD-10-CM | POA: Diagnosis not present

## 2014-05-17 DIAGNOSIS — N183 Chronic kidney disease, stage 3 (moderate): Secondary | ICD-10-CM | POA: Diagnosis not present

## 2014-05-17 DIAGNOSIS — I5043 Acute on chronic combined systolic (congestive) and diastolic (congestive) heart failure: Secondary | ICD-10-CM | POA: Diagnosis not present

## 2014-05-18 ENCOUNTER — Other Ambulatory Visit (HOSPITAL_COMMUNITY): Payer: Self-pay

## 2014-05-18 ENCOUNTER — Inpatient Hospital Stay (HOSPITAL_COMMUNITY): Payer: Medicare Other

## 2014-05-18 ENCOUNTER — Inpatient Hospital Stay (HOSPITAL_COMMUNITY)
Admission: EM | Admit: 2014-05-18 | Discharge: 2014-05-24 | DRG: 291 | Disposition: A | Discharge status: Home Health | Payer: Medicare Other | Attending: Internal Medicine | Admitting: Internal Medicine

## 2014-05-18 ENCOUNTER — Telehealth: Payer: Self-pay | Admitting: Cardiology

## 2014-05-18 ENCOUNTER — Encounter (HOSPITAL_COMMUNITY): Payer: Self-pay

## 2014-05-18 ENCOUNTER — Emergency Department (HOSPITAL_COMMUNITY): Payer: Medicare Other

## 2014-05-18 DIAGNOSIS — E876 Hypokalemia: Secondary | ICD-10-CM | POA: Insufficient documentation

## 2014-05-18 DIAGNOSIS — E119 Type 2 diabetes mellitus without complications: Secondary | ICD-10-CM | POA: Diagnosis not present

## 2014-05-18 DIAGNOSIS — I252 Old myocardial infarction: Secondary | ICD-10-CM

## 2014-05-18 DIAGNOSIS — I10 Essential (primary) hypertension: Secondary | ICD-10-CM | POA: Diagnosis present

## 2014-05-18 DIAGNOSIS — N189 Chronic kidney disease, unspecified: Secondary | ICD-10-CM | POA: Diagnosis present

## 2014-05-18 DIAGNOSIS — I4891 Unspecified atrial fibrillation: Secondary | ICD-10-CM | POA: Diagnosis not present

## 2014-05-18 DIAGNOSIS — I2510 Atherosclerotic heart disease of native coronary artery without angina pectoris: Secondary | ICD-10-CM | POA: Diagnosis present

## 2014-05-18 DIAGNOSIS — Z951 Presence of aortocoronary bypass graft: Secondary | ICD-10-CM

## 2014-05-18 DIAGNOSIS — R0602 Shortness of breath: Secondary | ICD-10-CM | POA: Diagnosis not present

## 2014-05-18 DIAGNOSIS — N1832 Chronic kidney disease, stage 3b: Secondary | ICD-10-CM | POA: Diagnosis present

## 2014-05-18 DIAGNOSIS — Z66 Do not resuscitate: Secondary | ICD-10-CM | POA: Diagnosis present

## 2014-05-18 DIAGNOSIS — I5021 Acute systolic (congestive) heart failure: Secondary | ICD-10-CM

## 2014-05-18 DIAGNOSIS — Z85828 Personal history of other malignant neoplasm of skin: Secondary | ICD-10-CM

## 2014-05-18 DIAGNOSIS — I129 Hypertensive chronic kidney disease with stage 1 through stage 4 chronic kidney disease, or unspecified chronic kidney disease: Secondary | ICD-10-CM | POA: Diagnosis not present

## 2014-05-18 DIAGNOSIS — N183 Chronic kidney disease, stage 3 (moderate): Secondary | ICD-10-CM | POA: Diagnosis not present

## 2014-05-18 DIAGNOSIS — R188 Other ascites: Secondary | ICD-10-CM

## 2014-05-18 DIAGNOSIS — R140 Abdominal distension (gaseous): Secondary | ICD-10-CM | POA: Diagnosis not present

## 2014-05-18 DIAGNOSIS — G2581 Restless legs syndrome: Secondary | ICD-10-CM | POA: Diagnosis present

## 2014-05-18 DIAGNOSIS — N179 Acute kidney failure, unspecified: Secondary | ICD-10-CM | POA: Diagnosis not present

## 2014-05-18 DIAGNOSIS — I480 Paroxysmal atrial fibrillation: Secondary | ICD-10-CM

## 2014-05-18 DIAGNOSIS — I509 Heart failure, unspecified: Secondary | ICD-10-CM | POA: Diagnosis not present

## 2014-05-18 DIAGNOSIS — Z95810 Presence of automatic (implantable) cardiac defibrillator: Secondary | ICD-10-CM

## 2014-05-18 DIAGNOSIS — I679 Cerebrovascular disease, unspecified: Secondary | ICD-10-CM | POA: Diagnosis present

## 2014-05-18 DIAGNOSIS — I255 Ischemic cardiomyopathy: Secondary | ICD-10-CM | POA: Diagnosis not present

## 2014-05-18 DIAGNOSIS — Z794 Long term (current) use of insulin: Secondary | ICD-10-CM

## 2014-05-18 DIAGNOSIS — D72829 Elevated white blood cell count, unspecified: Secondary | ICD-10-CM | POA: Diagnosis not present

## 2014-05-18 DIAGNOSIS — E1122 Type 2 diabetes mellitus with diabetic chronic kidney disease: Secondary | ICD-10-CM | POA: Diagnosis present

## 2014-05-18 DIAGNOSIS — I517 Cardiomegaly: Secondary | ICD-10-CM | POA: Diagnosis not present

## 2014-05-18 DIAGNOSIS — E039 Hypothyroidism, unspecified: Secondary | ICD-10-CM | POA: Diagnosis present

## 2014-05-18 DIAGNOSIS — Z825 Family history of asthma and other chronic lower respiratory diseases: Secondary | ICD-10-CM | POA: Diagnosis not present

## 2014-05-18 DIAGNOSIS — Z79899 Other long term (current) drug therapy: Secondary | ICD-10-CM

## 2014-05-18 DIAGNOSIS — R0902 Hypoxemia: Secondary | ICD-10-CM | POA: Diagnosis not present

## 2014-05-18 DIAGNOSIS — I25810 Atherosclerosis of coronary artery bypass graft(s) without angina pectoris: Secondary | ICD-10-CM

## 2014-05-18 DIAGNOSIS — I5043 Acute on chronic combined systolic (congestive) and diastolic (congestive) heart failure: Principal | ICD-10-CM

## 2014-05-18 DIAGNOSIS — F32A Depression, unspecified: Secondary | ICD-10-CM | POA: Insufficient documentation

## 2014-05-18 DIAGNOSIS — E1140 Type 2 diabetes mellitus with diabetic neuropathy, unspecified: Secondary | ICD-10-CM | POA: Diagnosis not present

## 2014-05-18 DIAGNOSIS — J9601 Acute respiratory failure with hypoxia: Secondary | ICD-10-CM | POA: Diagnosis not present

## 2014-05-18 DIAGNOSIS — Z7901 Long term (current) use of anticoagulants: Secondary | ICD-10-CM

## 2014-05-18 DIAGNOSIS — E1165 Type 2 diabetes mellitus with hyperglycemia: Secondary | ICD-10-CM | POA: Insufficient documentation

## 2014-05-18 DIAGNOSIS — J90 Pleural effusion, not elsewhere classified: Secondary | ICD-10-CM | POA: Diagnosis not present

## 2014-05-18 DIAGNOSIS — E785 Hyperlipidemia, unspecified: Secondary | ICD-10-CM | POA: Diagnosis present

## 2014-05-18 DIAGNOSIS — I5023 Acute on chronic systolic (congestive) heart failure: Secondary | ICD-10-CM | POA: Diagnosis not present

## 2014-05-18 DIAGNOSIS — G4733 Obstructive sleep apnea (adult) (pediatric): Secondary | ICD-10-CM | POA: Diagnosis not present

## 2014-05-18 DIAGNOSIS — J189 Pneumonia, unspecified organism: Secondary | ICD-10-CM | POA: Diagnosis present

## 2014-05-18 DIAGNOSIS — Z87891 Personal history of nicotine dependence: Secondary | ICD-10-CM

## 2014-05-18 DIAGNOSIS — F329 Major depressive disorder, single episode, unspecified: Secondary | ICD-10-CM | POA: Diagnosis present

## 2014-05-18 LAB — COMPREHENSIVE METABOLIC PANEL
ALT: 36 U/L (ref 0–53)
ANION GAP: 12 (ref 5–15)
AST: 31 U/L (ref 0–37)
Albumin: 3.7 g/dL (ref 3.5–5.2)
Alkaline Phosphatase: 67 U/L (ref 39–117)
BILIRUBIN TOTAL: 0.9 mg/dL (ref 0.3–1.2)
BUN: 34 mg/dL — AB (ref 6–23)
CO2: 27 mmol/L (ref 19–32)
CREATININE: 1.87 mg/dL — AB (ref 0.50–1.35)
Calcium: 9.5 mg/dL (ref 8.4–10.5)
Chloride: 107 mmol/L (ref 96–112)
GFR calc Af Amer: 37 mL/min — ABNORMAL LOW (ref >90)
GFR, EST NON AFRICAN AMERICAN: 32 mL/min — AB (ref >90)
GLUCOSE: 93 mg/dL (ref 70–99)
Potassium: 4.5 mmol/L (ref 3.5–5.1)
Sodium: 146 mmol/L — ABNORMAL HIGH (ref 135–145)
Total Protein: 6.8 g/dL (ref 6.0–8.3)

## 2014-05-18 LAB — CBC WITH DIFFERENTIAL/PLATELET
BASOS PCT: 0 % (ref 0–1)
Basophils Absolute: 0 10*3/uL (ref 0.0–0.1)
Eosinophils Absolute: 0 10*3/uL (ref 0.0–0.7)
Eosinophils Relative: 0 % (ref 0–5)
HEMATOCRIT: 41.6 % (ref 39.0–52.0)
HEMOGLOBIN: 13 g/dL (ref 13.0–17.0)
Lymphocytes Relative: 6 % — ABNORMAL LOW (ref 12–46)
Lymphs Abs: 1.2 10*3/uL (ref 0.7–4.0)
MCH: 30.4 pg (ref 26.0–34.0)
MCHC: 31.3 g/dL (ref 30.0–36.0)
MCV: 97.2 fL (ref 78.0–100.0)
MONO ABS: 1.1 10*3/uL — AB (ref 0.1–1.0)
MONOS PCT: 6 % (ref 3–12)
NEUTROS ABS: 16.4 10*3/uL — AB (ref 1.7–7.7)
Neutrophils Relative %: 88 % — ABNORMAL HIGH (ref 43–77)
Platelets: 191 10*3/uL (ref 150–400)
RBC: 4.28 MIL/uL (ref 4.22–5.81)
RDW: 14.3 % (ref 11.5–15.5)
WBC: 18.7 10*3/uL — ABNORMAL HIGH (ref 4.0–10.5)

## 2014-05-18 LAB — STREP PNEUMONIAE URINARY ANTIGEN: STREP PNEUMO URINARY ANTIGEN: NEGATIVE

## 2014-05-18 LAB — GLUCOSE, CAPILLARY
Glucose-Capillary: 158 mg/dL — ABNORMAL HIGH (ref 70–99)
Glucose-Capillary: 173 mg/dL — ABNORMAL HIGH (ref 70–99)

## 2014-05-18 LAB — TROPONIN I: TROPONIN I: 0.03 ng/mL (ref <0.031)

## 2014-05-18 LAB — I-STAT TROPONIN, ED: TROPONIN I, POC: 0.02 ng/mL (ref 0.00–0.08)

## 2014-05-18 LAB — BRAIN NATRIURETIC PEPTIDE: B NATRIURETIC PEPTIDE 5: 990.6 pg/mL — AB (ref 0.0–100.0)

## 2014-05-18 LAB — MRSA PCR SCREENING: MRSA BY PCR: NEGATIVE

## 2014-05-18 LAB — I-STAT CG4 LACTIC ACID, ED: Lactic Acid, Venous: 1.51 mmol/L (ref 0.5–2.0)

## 2014-05-18 LAB — PROCALCITONIN: Procalcitonin: 0.25 ng/mL

## 2014-05-18 MED ORDER — ATORVASTATIN CALCIUM 40 MG PO TABS
40.0000 mg | ORAL_TABLET | Freq: Every day | ORAL | Status: DC
  Administered 2014-05-18 – 2014-05-24 (×7): 40 mg via ORAL
  Filled 2014-05-18 (×8): qty 1

## 2014-05-18 MED ORDER — TAVABOROLE 5 % EX SOLN: 1.0000 [drp] | Freq: Every day | CUTANEOUS | Status: DC

## 2014-05-18 MED ORDER — ASPIRIN 81 MG PO CHEW: 324.0000 mg | CHEWABLE_TABLET | Freq: Once | ORAL | Status: DC

## 2014-05-18 MED ORDER — RAMIPRIL 2.5 MG PO CAPS
2.5000 mg | ORAL_CAPSULE | Freq: Two times a day (BID) | ORAL | Status: DC
  Administered 2014-05-18: 2.5 mg via ORAL
  Filled 2014-05-18 (×3): qty 1

## 2014-05-18 MED ORDER — SODIUM CHLORIDE 0.9 % IV SOLN: 250.0000 mL | INTRAVENOUS | Status: DC | PRN

## 2014-05-18 MED ORDER — ASPIRIN 325 MG PO TABS
325.0000 mg | ORAL_TABLET | Freq: Once | ORAL | Status: AC
  Administered 2014-05-18: 325 mg via ORAL
  Filled 2014-05-18: qty 1

## 2014-05-18 MED ORDER — SODIUM CHLORIDE 0.9 % IJ SOLN
3.0000 mL | INTRAMUSCULAR | Status: DC | PRN
  Administered 2014-05-19: 3 mL via INTRAVENOUS
  Filled 2014-05-18: qty 3

## 2014-05-18 MED ORDER — VANCOMYCIN HCL 10 G IV SOLR
2500.0000 mg | Freq: Once | INTRAVENOUS | Status: DC
  Administered 2014-05-18: 2500 mg via INTRAVENOUS
  Filled 2014-05-18: qty 2500

## 2014-05-18 MED ORDER — DEXTROSE 5 % IV SOLN
1.0000 g | INTRAVENOUS | Status: DC
  Administered 2014-05-18: 1 g via INTRAVENOUS
  Filled 2014-05-18 (×3): qty 1

## 2014-05-18 MED ORDER — CARVEDILOL 25 MG PO TABS
25.0000 mg | ORAL_TABLET | Freq: Two times a day (BID) | ORAL | Status: DC
Start: 2014-05-18 — End: 2014-05-18
  Filled 2014-05-18 (×2): qty 1

## 2014-05-18 MED ORDER — POTASSIUM CHLORIDE CRYS ER 20 MEQ PO TBCR
40.0000 meq | EXTENDED_RELEASE_TABLET | Freq: Every day | ORAL | Status: DC
  Administered 2014-05-18 – 2014-05-23 (×6): 40 meq via ORAL
  Filled 2014-05-18 (×6): qty 2

## 2014-05-18 MED ORDER — FUROSEMIDE 10 MG/ML IJ SOLN
80.0000 mg | Freq: Two times a day (BID) | INTRAMUSCULAR | Status: DC
  Administered 2014-05-18 – 2014-05-20 (×5): 80 mg via INTRAVENOUS
  Filled 2014-05-18 (×5): qty 8

## 2014-05-18 MED ORDER — INSULIN ASPART 100 UNIT/ML ~~LOC~~ SOLN
0.0000 [IU] | Freq: Three times a day (TID) | SUBCUTANEOUS | Status: DC
  Administered 2014-05-18 – 2014-05-19 (×3): 2 [IU] via SUBCUTANEOUS
  Administered 2014-05-20: 1 [IU] via SUBCUTANEOUS
  Administered 2014-05-20: 2 [IU] via SUBCUTANEOUS
  Administered 2014-05-21: 1 [IU] via SUBCUTANEOUS
  Administered 2014-05-21: 2 [IU] via SUBCUTANEOUS
  Administered 2014-05-21: 1 [IU] via SUBCUTANEOUS
  Administered 2014-05-22: 3 [IU] via SUBCUTANEOUS
  Administered 2014-05-22: 1 [IU] via SUBCUTANEOUS
  Administered 2014-05-23 (×3): 2 [IU] via SUBCUTANEOUS
  Administered 2014-05-24: 1 [IU] via SUBCUTANEOUS
  Administered 2014-05-24 (×2): 2 [IU] via SUBCUTANEOUS

## 2014-05-18 MED ORDER — INSULIN DETEMIR 100 UNIT/ML ~~LOC~~ SOLN
18.0000 [IU] | Freq: Every day | SUBCUTANEOUS | Status: DC
  Administered 2014-05-18 – 2014-05-22 (×5): 18 [IU] via SUBCUTANEOUS
  Filled 2014-05-18 (×7): qty 0.18

## 2014-05-18 MED ORDER — ESCITALOPRAM OXALATE 10 MG PO TABS
10.0000 mg | ORAL_TABLET | Freq: Every day | ORAL | Status: DC
  Administered 2014-05-18 – 2014-05-24 (×7): 10 mg via ORAL
  Filled 2014-05-18 (×7): qty 1

## 2014-05-18 MED ORDER — APIXABAN 2.5 MG PO TABS
2.5000 mg | ORAL_TABLET | Freq: Two times a day (BID) | ORAL | Status: DC
  Administered 2014-05-18 – 2014-05-24 (×12): 2.5 mg via ORAL
  Filled 2014-05-18 (×12): qty 1

## 2014-05-18 MED ORDER — VANCOMYCIN HCL 10 G IV SOLR: 1500.0000 mg | INTRAVENOUS | Status: DC

## 2014-05-18 MED ORDER — POTASSIUM CHLORIDE CRYS ER 20 MEQ PO TBCR: 20.0000 meq | EXTENDED_RELEASE_TABLET | Freq: Every day | ORAL | Status: DC

## 2014-05-18 MED ORDER — ONDANSETRON HCL 4 MG/2ML IJ SOLN: 4.0000 mg | Freq: Four times a day (QID) | INTRAMUSCULAR | Status: DC | PRN

## 2014-05-18 MED ORDER — ACETAMINOPHEN 325 MG PO TABS
650.0000 mg | ORAL_TABLET | ORAL | Status: DC | PRN
  Administered 2014-05-23: 650 mg via ORAL
  Filled 2014-05-18: qty 2

## 2014-05-18 MED ORDER — PRAMIPEXOLE DIHYDROCHLORIDE 0.25 MG PO TABS
0.5000 mg | ORAL_TABLET | Freq: Three times a day (TID) | ORAL | Status: DC
  Administered 2014-05-18 – 2014-05-24 (×16): 0.5 mg via ORAL
  Filled 2014-05-18 (×19): qty 2

## 2014-05-18 MED ORDER — VANCOMYCIN HCL 10 G IV SOLR
2500.0000 mg | Freq: Once | INTRAVENOUS | Status: DC
  Filled 2014-05-18: qty 2500

## 2014-05-18 MED ORDER — AMIODARONE HCL 200 MG PO TABS
200.0000 mg | ORAL_TABLET | Freq: Every day | ORAL | Status: DC
  Administered 2014-05-18: 200 mg via ORAL
  Filled 2014-05-18 (×2): qty 1

## 2014-05-18 MED ORDER — NITROGLYCERIN IN D5W 200-5 MCG/ML-% IV SOLN
10.0000 ug/min | INTRAVENOUS | Status: DC
  Administered 2014-05-18: 10 ug/min via INTRAVENOUS
  Filled 2014-05-18: qty 250

## 2014-05-18 MED ORDER — CARVEDILOL 25 MG PO TABS
25.0000 mg | ORAL_TABLET | Freq: Two times a day (BID) | ORAL | Status: DC
  Administered 2014-05-18: 25 mg via ORAL
  Filled 2014-05-18 (×4): qty 1

## 2014-05-18 MED ORDER — FUROSEMIDE 10 MG/ML IJ SOLN: 60.0000 mg | Freq: Two times a day (BID) | INTRAMUSCULAR | Status: DC

## 2014-05-18 MED ORDER — SODIUM CHLORIDE 0.9 % IJ SOLN: 3.0000 mL | Freq: Two times a day (BID) | INTRAMUSCULAR | Status: DC

## 2014-05-18 MED ORDER — ACETAMINOPHEN 500 MG PO TABS: 500.0000 mg | ORAL_TABLET | Freq: Every day | ORAL | Status: DC | PRN

## 2014-05-18 MED ORDER — NITROGLYCERIN IN D5W 200-5 MCG/ML-% IV SOLN: 0.0000 ug/min | Freq: Once | INTRAVENOUS | Status: DC

## 2014-05-18 MED ORDER — VANCOMYCIN HCL 10 G IV SOLR
1500.0000 mg | INTRAVENOUS | Status: DC
  Filled 2014-05-18: qty 1500

## 2014-05-18 MED ORDER — TAMSULOSIN HCL 0.4 MG PO CAPS
0.4000 mg | ORAL_CAPSULE | Freq: Every day | ORAL | Status: DC
  Administered 2014-05-18 – 2014-05-24 (×7): 0.4 mg via ORAL
  Filled 2014-05-18 (×7): qty 1

## 2014-05-18 NOTE — ED Notes (Signed)
cbg 100 with ems.
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
## 2014-05-18 NOTE — ED Notes (Signed)
Patient here for respiratory distress. Hx of chf, pt takes 60 mg lasix and was told to take 40 today in addition to 60. No improvement with total of 100 mg lasix, cpap placed per ems with no relief at 5 increased to 7.5 and feels  Some better. Pt given 1 ntg with ems and sats after cpap improved from 79 to 94 %. Rales through out with ems.
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
## 2014-05-18 NOTE — ED Notes (Signed)
Admission md at bedside, pt placed on NRB and oxygen maintains at 100 %.

## 2014-05-18 NOTE — Telephone Encounter (Signed)
Spoke with vanashia, she is currently with the patient. He is having SOB and his 02 sat is 86%. He has not been taking his furosemide as prescribed, instead of twice daily he is only taking once daily. He has already taken his 60 mg dose this am and was instructed to take an extra 40 mg now. He will then increase back to 60 mg twice daily. He was instructed to call the office by 1 pm today if his symptoms do not improve or worsen.
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
## 2014-05-18 NOTE — Progress Notes (Signed)
Patient admitted to 2C16 from Emergency Room. Patient O2 sats 100% on NRB. Patient denies any shortness of breath at this time. Denies any pain or discomfort at this time. Wife at bedside.

## 2014-05-18 NOTE — ED Notes (Signed)
Family at bedside. 

## 2014-05-18 NOTE — ED Notes (Signed)
Attempted report 
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
## 2014-05-19 ENCOUNTER — Inpatient Hospital Stay (HOSPITAL_COMMUNITY): Payer: Medicare Other

## 2014-05-19 DIAGNOSIS — J189 Pneumonia, unspecified organism: Secondary | ICD-10-CM

## 2014-05-19 LAB — BASIC METABOLIC PANEL
Anion gap: 7 (ref 5–15)
BUN: 40 mg/dL — ABNORMAL HIGH (ref 6–23)
CHLORIDE: 106 mmol/L (ref 96–112)
CO2: 30 mmol/L (ref 19–32)
Calcium: 8.7 mg/dL (ref 8.4–10.5)
Creatinine, Ser: 1.74 mg/dL — ABNORMAL HIGH (ref 0.50–1.35)
GFR calc non Af Amer: 35 mL/min — ABNORMAL LOW (ref >90)
GFR, EST AFRICAN AMERICAN: 40 mL/min — AB (ref >90)
Glucose, Bld: 107 mg/dL — ABNORMAL HIGH (ref 70–99)
POTASSIUM: 4 mmol/L (ref 3.5–5.1)
Sodium: 143 mmol/L (ref 135–145)

## 2014-05-19 LAB — LEGIONELLA ANTIGEN, URINE

## 2014-05-19 LAB — GLUCOSE, CAPILLARY
GLUCOSE-CAPILLARY: 80 mg/dL (ref 70–99)
GLUCOSE-CAPILLARY: 184 mg/dL — AB (ref 70–99)
Glucose-Capillary: 176 mg/dL — ABNORMAL HIGH (ref 70–99)
Glucose-Capillary: 115 mg/dL — ABNORMAL HIGH (ref 70–99)

## 2014-05-19 LAB — INFLUENZA PANEL BY PCR (TYPE A & B)
H1N1 flu by pcr: NOT DETECTED
INFLBPCR: NEGATIVE
Influenza A By PCR: NEGATIVE

## 2014-05-19 MED ORDER — SODIUM CHLORIDE 0.9 % IJ SOLN: 10.0000 mL | INTRAMUSCULAR | Status: DC | PRN

## 2014-05-19 MED ORDER — RAMIPRIL 1.25 MG PO CAPS
1.2500 mg | ORAL_CAPSULE | Freq: Every day | ORAL | Status: DC
  Administered 2014-05-19: 1.25 mg via ORAL
  Filled 2014-05-19 (×2): qty 1

## 2014-05-19 MED ORDER — SODIUM CHLORIDE 0.9 % IJ SOLN: 3.0000 mL | Freq: Two times a day (BID) | INTRAMUSCULAR | Status: DC

## 2014-05-19 MED ORDER — SODIUM CHLORIDE 0.9 % IJ SOLN
10.0000 mL | Freq: Two times a day (BID) | INTRAMUSCULAR | Status: DC
  Administered 2014-05-21 – 2014-05-23 (×4): 10 mL

## 2014-05-19 MED ORDER — CARVEDILOL 3.125 MG PO TABS
3.1250 mg | ORAL_TABLET | Freq: Two times a day (BID) | ORAL | Status: DC
Start: 2014-05-19 — End: 2014-05-24
  Administered 2014-05-19 – 2014-05-24 (×11): 3.125 mg via ORAL
  Filled 2014-05-19 (×13): qty 1

## 2014-05-19 MED ORDER — AMIODARONE HCL IN DEXTROSE 360-4.14 MG/200ML-% IV SOLN
60.0000 mg/h | INTRAVENOUS | Status: AC
  Administered 2014-05-19 (×2): 60 mg/h via INTRAVENOUS
  Filled 2014-05-19 (×2): qty 200

## 2014-05-19 MED ORDER — SODIUM CHLORIDE 0.9 % IV SOLN: 250.0000 mL | INTRAVENOUS | Status: DC

## 2014-05-19 MED ORDER — SODIUM CHLORIDE 0.9 % IJ SOLN: 3.0000 mL | INTRAMUSCULAR | Status: DC | PRN

## 2014-05-19 MED ORDER — AMIODARONE HCL IN DEXTROSE 360-4.14 MG/200ML-% IV SOLN
30.0000 mg/h | INTRAVENOUS | Status: DC
  Administered 2014-05-19 – 2014-05-23 (×9): 30 mg/h via INTRAVENOUS
  Filled 2014-05-19 (×12): qty 200

## 2014-05-19 MED ORDER — CARVEDILOL 6.25 MG PO TABS: 6.2500 mg | ORAL_TABLET | Freq: Two times a day (BID) | ORAL | Status: DC

## 2014-05-19 MED ORDER — SODIUM CHLORIDE 0.9 % IV BOLUS (SEPSIS)
250.0000 mL | Freq: Once | INTRAVENOUS | Status: AC
  Administered 2014-05-19: 250 mL via INTRAVENOUS

## 2014-05-19 MED ORDER — AMIODARONE LOAD VIA INFUSION
150.0000 mg | Freq: Once | INTRAVENOUS | Status: AC
  Administered 2014-05-19: 150 mg via INTRAVENOUS
  Filled 2014-05-19: qty 83.34

## 2014-05-19 NOTE — Progress Notes (Signed)
Dear Doctor: [name] This patient has been identified as a candidate for PICC for the following reason (s): poor veins/poor circulatory system (CHF, COPD, emphysema, diabetes, steroid use, IV drug abuse, etc.) If you agree, please write an order for the indicated device. For any questions contact the Vascular Access Team at [phone] if no answer, please leave a message.  Thank you for supporting the early vascular access assessment program.

## 2014-05-19 NOTE — Progress Notes (Signed)
[place] TEAM 1 - Stepdown/ICU TEAM Progress Note  [name] [id number] DOB: March 05, 1929 DOA: 05/18/2014 PCP: Lottie Dawson, MD  Admit HPI / Brief Narrative: 81 year old male w/ a history of CAD s/p CABG, ischemic cardiomyopathy, paroxysmal atrial fibrillation on Eliquis, CKD stage III, diabetes mellitus type 2, hypertension, dyslipidemia, and OSA on CPAP who presented to the ED with worsening shortness of breath, weight gain, and peripheral edema.  Patient with recent hospitalization 04/27/14-04/30/14 due to a CHF exacerbation, discharged on Lasix 60 MG twice a day.  Due to some confusion had not taken his nightly dose of Lasix for 4 days.  En route to the ED patient was placed on BiPAP for hypoxia with O2 sats of 86% and placed on nitro drip for elevated blood pressure.  In the ED, WBCs 18.7, BNP 990, CXR with early infiltrate.  Heart failure team was consulted, and patient was admitted for CHF exacerbation and possible HCAP.  HPI/Subjective: A and O x4 - on [place] O2. Feels that his sob has improved, but that his breathing is not yet back to his baseline.  Denies cp, n/v, or abdom pain.    Assessment/Plan:  Acute Systolic CHF exacerbation -missed 4 doses of 60mg  lasix at home -2D echo- [id number], grade 2 diastolic dysfunction -IV Lasix 80 mg twice a day -Strict I and O's: net neg 1.9 L -Daily weight monitoring- 3/1- 102.2 kg; 3/2-100kg  ?HCAP  -clinical picture not convincing for infection -rapidly improving with diuresis  -procalcitonin low  -Strep/legionella negative -influenza pending but very low likelihood  -stop abx and follow clinically w/ ongoing tx for CHF  Hypertension -Blood pressure soft but improving  -per Cards- BP meds decreased, Coreg 3.125 and Ramipril 1.25 daily  -Normal saline 250 cc bolus 1 -Hold Lasix for systolic blood pressure less than 85  Atrial fibrillation -s/p CRT device, with episode of afib w/ RVR overnight 3/1 -per Cards- started on Amio  infusion, if does not convert will need DCCV -Continue home Eliquis  Hypokalemia -K Dur 50meq 1 -Repeat bmet in a.m.  Abdominal distention -+BS, passing flatus - [place] abd- no ascites  CAD/ischemic cardiomyopathy -Status post CABG. -not on ASA (on Eliquis), continue Lipitor  CKD, stage III -creatinine 1.74, baseline about 1.4-1.7  -repeat BMET in am  Diabetes mellitus type II -CBGs controlled -Hemoglobin A1c pending -Continue Levemir 18 units, sensitive SSI  Dyslipidemia -cont Lipitor  Restless leg syndrome Continue Mirapex daily  Depression Continue Lexapro daily  OSA -continue home CPAP  Code Status: DNR Family Communication: Multiple family members at bedsie Disposition Plan: SDU for amio infusion  Consultants: Cardiology  Procedure/Significant Events: 3/2 - started on Amio infusion per cards for Afib with RVR  Antibiotics: Vancomycin 3/1 > 3/2 cefepine 3/1   DVT prophylaxis: Eliquis  Objective: Blood pressure 121/71, pulse 83, temperature 97.3 F (36.3 C), temperature source Oral, resp. rate 22, height 6\' 2"  ([id number] m), weight 100.064 kg (220 lb 9.6 oz), SpO2 97 %.  Intake/Output Summary (Last 24 hours) at 05/19/14 1154 Last data filed at 05/19/14 0900  Gross per 24 hour  Intake 995.95 ml  Output   2700 ml  Net -1704.05 ml   Exam: General: A and O male in NAD on [place] O2 Lungs: bibasilar crackles w/o wheeze  Cardiovascular: Tachycardic, regular - no murmur gallop or rub, + JVD Abdomen: distended, nontender, soft, bowel sounds positive, no rebound, no ascites, no appreciable mass Extremities: No significant cyanosis, clubbing, 2+ edema L > R leg.  Data Reviewed: Basic Metabolic Panel:  Recent Labs Lab 05/18/14 1105 05/19/14 0409  NA 146* 143  K 4.5 4.0  CL 107 106  CO2 27 30  GLUCOSE 93 107*  BUN 34* 40*  CREATININE 1.87* 1.74*  CALCIUM 9.5 8.7   Liver Function Tests:  Recent Labs Lab 05/18/14 1105  AST 31  ALT 36  ALKPHOS 67    BILITOT 0.9  PROT 6.8  ALBUMIN 3.7   CBC:  Recent Labs Lab 05/18/14 1105  WBC 18.7*  NEUTROABS 16.4*  HGB 13.0  HCT 41.6  MCV 97.2  PLT 191   Cardiac Enzymes:  Recent Labs Lab 05/18/14 1545  TROPONINI 0.03   BNP (last 3 results)  Recent Labs  04/27/14 1718 04/29/14 0522 05/18/14 1105  BNP 1434.8* 1174.5* 990.6*    CBG:  Recent Labs Lab 05/18/14 1635 05/18/14 2157 05/19/14 0824  GLUCAP 158* 173* 80    Recent Results (from the past 240 hour(s))  Blood culture (routine x 2)     Status: None (Preliminary result)   Collection Time: 05/18/14  1:40 PM  Result Value Ref Range Status   Specimen Description BLOOD LEFT HAND  Final   Special Requests BOTTLES DRAWN AEROBIC ONLY 5CC  Final   Culture   Final           BLOOD CULTURE RECEIVED NO GROWTH TO DATE CULTURE WILL BE HELD FOR 5 DAYS BEFORE ISSUING A FINAL NEGATIVE REPORT Performed at [facility]    Report Status PENDING  Incomplete  Blood culture (routine x 2)     Status: None (Preliminary result)   Collection Time: 05/18/14  1:45 PM  Result Value Ref Range Status   Specimen Description BLOOD RIGHT ARM  Final   Special Requests BOTTLES DRAWN AEROBIC AND ANAEROBIC 5CC  Final   Culture   Final           BLOOD CULTURE RECEIVED NO GROWTH TO DATE CULTURE WILL BE HELD FOR 5 DAYS BEFORE ISSUING A FINAL NEGATIVE REPORT Performed at [facility]    Report Status PENDING  Incomplete  MRSA PCR Screening     Status: None   Collection Time: 05/18/14  2:25 PM  Result Value Ref Range Status   MRSA by PCR NEGATIVE NEGATIVE Final    Comment:        The GeneXpert MRSA Assay (FDA approved for NASAL specimens only), is one component of a comprehensive MRSA colonization surveillance program. It is not intended to diagnose MRSA infection nor to guide or monitor treatment for MRSA infections.      Studies:  Recent x-ray studies have been reviewed in detail by the Attending Physician  Scheduled  Meds:  Scheduled Meds: . apixaban  2.5 mg Oral BID  . atorvastatin  40 mg Oral Daily  . carvedilol  3.125 mg Oral BID WC  . ceFEPime (MAXIPIME) IV  1 g Intravenous Q24H  . escitalopram  10 mg Oral Daily  . furosemide  80 mg Intravenous BID  . insulin aspart  0-9 Units Subcutaneous TID WC  . insulin detemir  18 Units Subcutaneous QHS  . potassium chloride  40 mEq Oral Daily  . pramipexole  0.5 mg Oral TID  . ramipril  1.25 mg Oral Daily  . sodium chloride  3 mL Intravenous Q12H  . tamsulosin  0.4 mg Oral Daily  . vancomycin  1,500 mg Intravenous Q24H  . vancomycin  2,500 mg Intravenous Once    Time spent on  care of this patient: 48 mins   Lacy Duverney , [facility]  [facility] Office  [phone] Pager - [phone]  On-Call/Text Page:      [email or web address]      password [email or web address]  If 7PM-7AM, please contact night-coverage [email or web address] Password [email or web address] 05/19/2014, 11:54 AM   LOS: 1 day   I have personally examined this patient and reviewed the entire database. I have reviewed the above note, made any necessary editorial changes, and agree with its content.  Cherene Altes, MD [facility]

## 2014-05-19 NOTE — Care Management (Addendum)
Patient is an active patient with [facility], currently receiving Nursing and Physical Therapy. I will follow for [facility] needs.  Thank you.  Christa See RN BSN [place] [phone]

## 2014-05-19 NOTE — Progress Notes (Signed)
Patient ID: [name], male   DOB: [birth year], 81 y.o.   MRN: [id number]   SUBJECTIVE: HR 110s, patient appears to have gone into atrial fibrillation with RVR overnight, not BiV pacing. SBP in upper 80s overnight, now 97/50.  He diuresed reasonably well on Lasix yesterday, no weight.   Scheduled Meds: . amiodarone  150 mg Intravenous Once  . apixaban  2.5 mg Oral BID  . atorvastatin  40 mg Oral Daily  . carvedilol  3.125 mg Oral BID WC  . ceFEPime (MAXIPIME) IV  1 g Intravenous Q24H  . escitalopram  10 mg Oral Daily  . furosemide  80 mg Intravenous BID  . insulin aspart  0-9 Units Subcutaneous TID WC  . insulin detemir  18 Units Subcutaneous QHS  . potassium chloride  40 mEq Oral Daily  . pramipexole  0.5 mg Oral TID  . ramipril  1.25 mg Oral Daily  . sodium chloride  3 mL Intravenous Q12H  . tamsulosin  0.4 mg Oral Daily  . vancomycin  1,500 mg Intravenous Q24H  . vancomycin  2,500 mg Intravenous Once   Continuous Infusions: . amiodarone     Followed by  . amiodarone     PRN Meds:.sodium chloride, acetaminophen, ondansetron (ZOFRAN) IV, sodium chloride    Filed Vitals:   05/19/14 0700 05/19/14 0715 05/19/14 0730 05/19/14 0830  BP: 88/52  82/55 87/61  Pulse: 95 109 82 108  Temp:    97.1 F (36.2 C)  TempSrc:    Axillary  Resp: 14 12 17 19   Height:      Weight:      SpO2:    99%    Intake/Output Summary (Last 24 hours) at 05/19/14 0928 Last data filed at 05/19/14 0900  Gross per 24 hour  Intake 995.95 ml  Output   2900 ml  Net -1904.05 ml    LABS: Basic Metabolic Panel:  Recent Labs  05/18/14 1105 05/19/14 0409  NA 146* 143  K 4.5 4.0  CL 107 106  CO2 27 30  GLUCOSE 93 107*  BUN 34* 40*  CREATININE 1.87* 1.74*  CALCIUM 9.5 8.7   Liver Function Tests:  Recent Labs  05/18/14 1105  AST 31  ALT 36  ALKPHOS 67  BILITOT 0.9  PROT 6.8  ALBUMIN 3.7   No results for input(s): LIPASE, AMYLASE in the last 72 hours. CBC:  Recent Labs  05/18/14 1105  WBC 18.7*  NEUTROABS 16.4*  HGB 13.0  HCT 41.6  MCV 97.2  PLT 191   Cardiac Enzymes:  Recent Labs  05/18/14 1545  TROPONINI 0.03   BNP: Invalid input(s): POCBNP D-Dimer: No results for input(s): DDIMER in the last 72 hours. Hemoglobin A1C: No results for input(s): HGBA1C in the last 72 hours. Fasting Lipid Panel: No results for input(s): CHOL, HDL, LDLCALC, TRIG, CHOLHDL, LDLDIRECT in the last 72 hours. Thyroid Function Tests: No results for input(s): TSH, T4TOTAL, T3FREE, THYROIDAB in the last 72 hours.  Invalid input(s): FREET3 Anemia Panel: No results for input(s): VITAMINB12, FOLATE, FERRITIN, TIBC, IRON, RETICCTPCT in the last 72 hours.  RADIOLOGY: Dg Chest 2 View  04/27/2014   CLINICAL DATA:  Shortness of breath for 2 weeks  EXAM: CHEST  2 VIEW  COMPARISON:  03/25/2014  FINDINGS: Stable cardiomegaly and aortic tortuosity post CABG. Stable positioning of biventricular ICD/ pacer leads from the left. There is new interstitial opacity with [employer]. No pleural effusion or pneumonia. No pneumothorax.  IMPRESSION: Mild CHF.  Electronically Signed   By: Monte Fantasia M.D.   On: 04/27/2014 17:30   US Abdomen Limited  05/18/2014   CLINICAL DATA:  Abdominal distention.  Ascites.  EXAM: LIMITED ABDOMEN ULTRASOUND FOR ASCITES  TECHNIQUE: Limited ultrasound survey for ascites was performed in all four abdominal quadrants.  COMPARISON:  03/01/2014 renal ultrasound.  FINDINGS: No ascites is visible.  IMPRESSION: Negative for ascites.   Electronically Signed   By: Dereck Ligas M.D.   On: 05/18/2014 20:37   Dg Chest Port 1 View  05/18/2014   CLINICAL DATA:  Shortness of breath, history of atrial fibrillation  EXAM: PORTABLE CHEST - 1 VIEW  COMPARISON:  04/27/2014  FINDINGS: Cardiomegaly again noted. Status post CABG. Dual lead cardiac pacemaker is unchanged in position. Central mild vascular congestion without convincing pulmonary edema. There is streaky right  basilar atelectasis or infiltrate.  IMPRESSION: Cardiomegaly. Dual lead cardiac pacemaker in place. Status post CABG. Central mild vascular congestion without convincing pulmonary edema. Streaky right basilar atelectasis or early infiltrate.   Electronically Signed   By: Lahoma Crocker M.D.   On: 05/18/2014 11:39    PHYSICAL EXAM General: NAD Neck: JVP 12 cm, no thyromegaly or thyroid nodule.  Lungs: Slight crackles at bases.   CV: Nondisplaced PMI.  Heart mildly tachy, irregular S1/S2, no S3/S4, no murmur.  1+ edema 1/2 to knees bilaterally.   Abdomen: Soft, nontender, no hepatosplenomegaly, no distention.  Neurologic: Alert and oriented x 3.  Psych: Normal affect. Extremities: No clubbing or cyanosis.   TELEMETRY: Reviewed telemetry pt in atrial fibrillation  ASSESSMENT AND PLAN: 1. Acute on chronic systolic CHF: Ischemic cardiomyopathy. EF 20-25% on 2/16 echo. He has missed his pm Lasix x 4 days and wife shows record of his weight steadily increasing. He diuresed with IV Lasix yesterday but he remains volume overloaded on exam with distended abdomen, peripheral edema, and JVD. Abdominal US showed no ascites. He has a [employer] CRT-D device, but he has gone into atrial fibrillation with RVR and is not BiV pacing now.  - Continue Lasix 80 mg IV bid.  - With soft BP this morning (may coincide with afib/RVR and low of BiV pacing), will cut back doses of Coreg and ramipril.   2. CAD: h/o CABG. No chest pain. He is on Eliquis so not on ASA. Continue statin.  3. Atrial fibrillation: Paroxysmal, went into atrial fibrillation again it appears overnight 3/1. I do not think that he is going to tolerate atrial fibrillation well.  I will reload amiodarone IV today.  If he does not convert to NSR on his own, will need DCCV.  He has been on Eliquis chronically.  4. CKD: Creatinine currently at baseline. Follow closely with diuresis.  As above, cutting back meds with low BP.   [name] 05/19/2014 9:35 AM

## 2014-05-19 NOTE — Progress Notes (Signed)
Lamar Blinks, NP, was updated about pt soft BP. Orders given to maintain MAP greater than 55. I will monitor BP Q 15 minutes and assess for any changes in the pt condition. Currently he is sleeping and vital signs WNL.   Alessandra Grout, RN

## 2014-05-19 NOTE — Progress Notes (Signed)
Patient refuses CPAP at this time. He stated he did not tolerate it well last night. RT will continue to monitor.

## 2014-05-19 NOTE — Progress Notes (Signed)
Pts flu swab negative, pt taken of droplet prec.

## 2014-05-19 NOTE — Progress Notes (Signed)
Peripherally Inserted Central Catheter/Midline Placement  The IV Nurse has discussed with the patient and/or persons authorized to consent for the patient, the purpose of this procedure and the potential benefits and risks involved with this procedure.  The benefits include less needle sticks, lab draws from the catheter and patient may be discharged home with the catheter.  Risks include, but not limited to, infection, bleeding, blood clot (thrombus formation), and puncture of an artery; nerve damage and irregular heat beat.  Alternatives to this procedure were also discussed.  PICC/Midline Placement Documentation        [name] 05/19/2014, 6:00 PM

## 2014-05-19 NOTE — Care Management Note (Signed)
    Page 1 of 1   05/24/2014     1:51:48 PM CARE MANAGEMENT NOTE 05/24/2014  Patient:  [name], [name]   Account Number:  [id number]  Date Initiated:  05/19/2014  Documentation initiated by:  MAYO,HENRIETTA  Subjective/Objective Assessment:   dx systolic failure/PNA; lives with spouse, active with [facility] for RN and PT    PCP  Shanon Ace     Action/Plan:   Anticipated DC Date:  05/23/2014   Anticipated DC Plan:  [place]  CM consult      [facility] Choice  Resumption Of Svcs/PTA Provider   Choice offered to / List presented to:          [facility] arranged  HH-1 RN  [place]      [facility] agency  [facility]   Status of service:  In process, will continue to follow Medicare Important Message given?  YES (If response is "NO", the following Medicare IM given date fields will be blank) Date Medicare IM given:  05/21/2014 Medicare IM given by:  GRAVES-BIGELOW,Davanee Klinkner Date Additional Medicare IM given:  05/24/2014 Additional Medicare IM given by:  [place]  Discharge Disposition:  [place]  Per UR Regulation:  Reviewed for med. necessity/level of care/duration of stay  If discussed at [place] of Stay Meetings, dates discussed:   05/25/2014    Comments:  05-21-14 [place], RN, BSN [phone]  Pt will need HH resumption orders for HHRN/PT services. Pt is active with [facility]. SOC to begin within 24-48 hours post d/c.

## 2014-05-20 ENCOUNTER — Inpatient Hospital Stay (HOSPITAL_COMMUNITY): Payer: Medicare Other | Admitting: Anesthesiology

## 2014-05-20 ENCOUNTER — Encounter (HOSPITAL_COMMUNITY): Payer: Self-pay | Admitting: *Deleted

## 2014-05-20 ENCOUNTER — Encounter: Payer: Self-pay | Admitting: Internal Medicine

## 2014-05-20 ENCOUNTER — Encounter (HOSPITAL_COMMUNITY)
Admission: EM | Disposition: A | Discharge status: Home Health | Payer: Self-pay | Source: Home / Self Care | Attending: Internal Medicine

## 2014-05-20 DIAGNOSIS — E119 Type 2 diabetes mellitus without complications: Secondary | ICD-10-CM

## 2014-05-20 DIAGNOSIS — I255 Ischemic cardiomyopathy: Secondary | ICD-10-CM | POA: Insufficient documentation

## 2014-05-20 DIAGNOSIS — I10 Essential (primary) hypertension: Secondary | ICD-10-CM

## 2014-05-20 DIAGNOSIS — R140 Abdominal distension (gaseous): Secondary | ICD-10-CM | POA: Insufficient documentation

## 2014-05-20 DIAGNOSIS — E1165 Type 2 diabetes mellitus with hyperglycemia: Secondary | ICD-10-CM | POA: Insufficient documentation

## 2014-05-20 DIAGNOSIS — E876 Hypokalemia: Secondary | ICD-10-CM

## 2014-05-20 DIAGNOSIS — G4733 Obstructive sleep apnea (adult) (pediatric): Secondary | ICD-10-CM

## 2014-05-20 DIAGNOSIS — Z794 Long term (current) use of insulin: Secondary | ICD-10-CM | POA: Insufficient documentation

## 2014-05-20 DIAGNOSIS — N183 Chronic kidney disease, stage 3 (moderate): Secondary | ICD-10-CM

## 2014-05-20 DIAGNOSIS — F32A Depression, unspecified: Secondary | ICD-10-CM | POA: Insufficient documentation

## 2014-05-20 DIAGNOSIS — F329 Major depressive disorder, single episode, unspecified: Secondary | ICD-10-CM

## 2014-05-20 HISTORY — PX: CARDIOVERSION: SHX1299

## 2014-05-20 LAB — CBC
HCT: 36.2 % — ABNORMAL LOW (ref 39.0–52.0)
Hemoglobin: 11.3 g/dL — ABNORMAL LOW (ref 13.0–17.0)
MCH: 29.7 pg (ref 26.0–34.0)
MCHC: 31.2 g/dL (ref 30.0–36.0)
MCV: 95 fL (ref 78.0–100.0)
Platelets: 156 10*3/uL (ref 150–400)
RBC: 3.81 MIL/uL — AB (ref 4.22–5.81)
RDW: 14.2 % (ref 11.5–15.5)
WBC: 8.8 10*3/uL (ref 4.0–10.5)

## 2014-05-20 LAB — BASIC METABOLIC PANEL
Anion gap: 6 (ref 5–15)
BUN: 40 mg/dL — AB (ref 6–23)
CHLORIDE: 103 mmol/L (ref 96–112)
CO2: 33 mmol/L — AB (ref 19–32)
Calcium: 9 mg/dL (ref 8.4–10.5)
Creatinine, Ser: 1.72 mg/dL — ABNORMAL HIGH (ref 0.50–1.35)
GFR calc Af Amer: 41 mL/min — ABNORMAL LOW (ref >90)
GFR calc non Af Amer: 35 mL/min — ABNORMAL LOW (ref >90)
GLUCOSE: 102 mg/dL — AB (ref 70–99)
POTASSIUM: 4.1 mmol/L (ref 3.5–5.1)
Sodium: 142 mmol/L (ref 135–145)

## 2014-05-20 LAB — HEMOGLOBIN A1C
Hgb A1c MFr Bld: 6.3 % — ABNORMAL HIGH (ref 4.8–5.6)
Mean Plasma Glucose: 134 mg/dL

## 2014-05-20 LAB — MAGNESIUM: Magnesium: 2.1 mg/dL (ref 1.5–2.5)

## 2014-05-20 LAB — GLUCOSE, CAPILLARY
Glucose-Capillary: 92 mg/dL (ref 70–99)
Glucose-Capillary: 125 mg/dL — ABNORMAL HIGH (ref 70–99)
Glucose-Capillary: 180 mg/dL — ABNORMAL HIGH (ref 70–99)
Glucose-Capillary: 167 mg/dL — ABNORMAL HIGH (ref 70–99)

## 2014-05-20 LAB — PROCALCITONIN: Procalcitonin: 0.19 ng/mL

## 2014-05-20 SURGERY — CARDIOVERSION
Anesthesia: Monitor Anesthesia Care

## 2014-05-20 MED ORDER — ETOMIDATE 2 MG/ML IV SOLN
INTRAVENOUS | Status: DC | PRN
  Administered 2014-05-20: 11 mg via INTRAVENOUS

## 2014-05-20 MED ORDER — MEPERIDINE HCL 25 MG/ML IJ SOLN: 6.2500 mg | INTRAMUSCULAR | Status: DC | PRN

## 2014-05-20 MED ORDER — FENTANYL CITRATE 0.05 MG/ML IJ SOLN: 25.0000 ug | INTRAMUSCULAR | Status: DC | PRN

## 2014-05-20 MED ORDER — SODIUM CHLORIDE 0.9 % IV SOLN
INTRAVENOUS | Status: DC | PRN
  Administered 2014-05-20: 10:00:00 via INTRAVENOUS

## 2014-05-20 MED ORDER — METOLAZONE 5 MG PO TABS
2.5000 mg | ORAL_TABLET | Freq: Every day | ORAL | Status: DC
  Administered 2014-05-20 – 2014-05-22 (×3): 2.5 mg via ORAL
  Filled 2014-05-20 (×2): qty 0.5
  Filled 2014-05-20: qty 1

## 2014-05-20 MED ORDER — PROMETHAZINE HCL 25 MG/ML IJ SOLN: 6.2500 mg | INTRAMUSCULAR | Status: DC | PRN

## 2014-05-20 NOTE — Anesthesia Postprocedure Evaluation (Signed)
  Anesthesia Post-op Note  Patient: [name]  Procedure(s) Performed: Procedure(s): CARDIOVERSION (N/A)  Patient Location: PACU and Endoscopy Unit  Anesthesia Type:MAC  Level of Consciousness: awake  Airway and Oxygen Therapy: Patient Spontanous Breathing and Patient connected to nasal cannula oxygen  Post-op Pain: mild  Post-op Assessment: Post-op Vital signs reviewed, Patient's Cardiovascular Status Stable, Respiratory Function Stable, Patent Airway and No signs of Nausea or vomiting  Post-op Vital Signs: Reviewed and stable  Last Vitals:  Filed Vitals:   05/20/14 0936  BP: 120/76  Pulse: 95  Temp: 36.9 C  Resp: 20    Complications: No apparent anesthesia complications

## 2014-05-20 NOTE — Transfer of Care (Signed)
Immediate Anesthesia Transfer of Care Note  Patient: [name]  Procedure(s) Performed: Procedure(s): CARDIOVERSION (N/A)  Patient Location: PACU and Endoscopy Unit  Anesthesia Type:MAC  Level of Consciousness: awake, oriented, patient cooperative, lethargic and responds to stimulation  Airway & Oxygen Therapy: Patient Spontanous Breathing and Patient connected to nasal cannula oxygen  Post-op Assessment: Report given to RN, Post -op Vital signs reviewed and stable and Patient moving all extremities  Post vital signs: Reviewed and stable  Last Vitals:  BP 123/62 HR 60 SpO2 (2L [place]) 97 Complications: No apparent anesthesia complications

## 2014-05-20 NOTE — H&P (Signed)
     INTERVAL PROCEDURE H&P  History and Physical Interval Note:  05/20/2014 9:38 AM  [name] has presented today for their planned procedure. The various methods of treatment have been discussed with the patient and family. After consideration of risks, benefits and other options for treatment, the patient has consented to the procedure.  The patients' outpatient history has been reviewed, patient examined, and no change in status from most recent office note within the past 30 days. I have reviewed the patients' chart and labs and will proceed as planned. Questions were answered to the patient's satisfaction.   [name] Casino, MD, [facility] Attending Cardiologist [facility]  [name] 05/20/2014, 9:38 AM

## 2014-05-20 NOTE — CV Procedure (Signed)
    CARDIOVERSION NOTE  Procedure: Electrical Cardioversion Indications:  Atrial Fibrillation  Procedure Details:  Consent: Risks of procedure as well as the alternatives and risks of each were explained to the (patient/caregiver).  Consent for procedure obtained.  Time Out: Verified patient identification, verified procedure, site/side was marked, verified correct patient position, special equipment/implants available, medications/allergies/relevent history reviewed, required imaging and test results available.  Performed  Patient placed on cardiac monitor, pulse oximetry, supplemental oxygen as necessary.  Sedation given: Propofol per anesthesia Pacer pads placed anterior and posterior chest.away from the BI-V AICD  Cardioverted 1 time(s).  Cardioverted at 150J biphasic.  Impression: Findings: Post procedure EKG shows: biventricular pacing - as confirmed by device interrogation Complications: None Patient did tolerate procedure well.  Plan: 1. Successful DCCV to a bi-ventricular paced rhythm with a single 150J biphasic shock. 2. Further management per CHF service.  Time Spent Directly with the Patient:  30 minutes   Pixie Casino, MD, [facility] Attending Cardiologist [facility]  HILTY,Kenneth C 05/20/2014, 10:06 AM

## 2014-05-20 NOTE — Anesthesia Procedure Notes (Signed)
Procedure Name: MAC Date/Time: 05/20/2014 9:56 AM Performed by: Ned Grace Pre-anesthesia Checklist: Patient identified, Timeout performed, Emergency Drugs available, Suction available and Patient being monitored Patient Re-evaluated:Patient Re-evaluated prior to inductionOxygen Delivery Method: Ambu bag Intubation Type: IV induction Ventilation: Mask ventilation without difficulty

## 2014-05-20 NOTE — Progress Notes (Signed)
[place] TEAM 1 - Stepdown/ICU TEAM Progress Note  KAW OSCARSON [id number] DOB: Nov 10, 1928 DOA: 05/18/2014 PCP: Lottie Dawson, MD  Admit HPI / Brief Narrative: 81 year old male w/ a history of CAD s/p CABG, ischemic cardiomyopathy, paroxysmal atrial fibrillation on Eliquis, CKD stage III, diabetes mellitus type 2, hypertension, dyslipidemia, and OSA on CPAP who presented to the ED with worsening shortness of breath, weight gain, and peripheral edema.  Patient with recent hospitalization 04/27/14-04/30/14 due to a CHF exacerbation, discharged on Lasix 60 MG twice a day.  Due to some confusion had not taken his nightly dose of Lasix for 4 days.  En route to the ED patient was placed on BiPAP for hypoxia with O2 sats of 86% and placed on nitro drip for elevated blood pressure.  In the ED, WBCs 18.7, BNP 990, CXR with early infiltrate.  Heart failure team was consulted, and patient was admitted for CHF exacerbation and possible HCAP. 3/3- underwent DCCV for Afib despite Amio gtt.  HPI/Subjective: A and O x4 -  Feels that breathing back to baseline, states not requiring additional O2.  Denies cp, n/v, or abdom pain.    Assessment/Plan:  Acute Systolic CHF exacerbation -missed 4 doses of 60mg  lasix at home -2D echo- [id number], grade 2 diastolic dysfunction -Per heart failure team- cont IV Lasix with addition of metolazone. continue CVP monitoring -Strict I and O's: net neg 3.8 L -Daily weight monitoring- 3/1- 102.2 kg; 3/2-101.9kg  ?HCAP  -remains afebrile, no leukocytosis, low procalcitonin, weaned off [place] O2 to RA -rapidly improving with diuresis- stopped abx 3/2- follow clinically -Strep/legionella/ influenza  negative  Hypertension -Blood pressure had improved  -per Cards- decreased dosage BP meds Coreg 3.125 and Ramipril 1.25 daily  -Hold Lasix for systolic blood pressure less than 85  Paroxysmal Atrial fibrillation -s/p CRT device. 3/1- went into sustained afib with RVR despite  Amiodarone gtt -per Cards- underwent DCCV 3/3, placed on Amio gtt; continue CVP measuring -Continue home Eliquis  Hypokalemia -resolved  Abdominal distention -+BS, passing flatus - [place] abd- no ascites  CAD/ischemic cardiomyopathy -Status post CABG. -not on ASA (on Eliquis), continue Lipitor  CKD, stage III -creatinine 1.72, baseline about 1.4-1.7  -repeat BMET in am  Diabetes mellitus type II controlled -CBGs controlled -3/2 Hemoglobin A1c = 6.3  -Continue Levemir 18 units, sensitive SSI  Dyslipidemia -cont Lipitor  Restless leg syndrome Continue Mirapex daily  Depression Continue Lexapro daily  OSA -continue home CPAP  Code Status: DNR Family Communication: Wife at bedsie Disposition Plan: SDU for amio infusion  Consultants: Cardiology Heart Failure team  Procedure/Significant Events: 3/2 - started on Amio infusion per cards for Afib with RVR 3/3- DCCV for continued Afib despite Amio gtt  Culture None  Antibiotics: Vancomycin and Cefepime 3/1 > 3/2  DVT prophylaxis: On Eliquis   Devices Pacemaker   LINES / TUBES:  PICC for CVP    Objective: Blood pressure 129/68, pulse 66, temperature 97.6 F (36.4 C), temperature source Oral, resp. rate 21, height 6\' 2"  (1.88 m), weight 101.9 kg (224 lb 10.4 oz), SpO2 96 %.  Intake/Output Summary (Last 24 hours) at 05/20/14 1513 Last data filed at 05/20/14 1346  Gross per 24 hour  Intake    290 ml  Output   2300 ml  Net  -2010 ml   Exam: General:Caucasian  male in NAD Lungs: CTAB, no crackles of wheeze Cardiovascular:RRR, no murmur gallop or rub, + JVD Abdomen: distended, nontender, soft, bowel sounds positive, no rebound, no ascites,  no appreciable mass Extremities: No significant cyanosis, clubbing, 1+ edema L > R leg.   Data Reviewed: Basic Metabolic Panel:  Recent Labs Lab 05/18/14 1105 05/19/14 0409 05/20/14 0645  NA 146* 143 142  K 4.5 4.0 4.1  CL 107 106 103  CO2 27 30 33*  GLUCOSE  93 107* 102*  BUN 34* 40* 40*  CREATININE 1.87* 1.74* 1.72*  CALCIUM 9.5 8.7 9.0  MG  --   --  2.1   Liver Function Tests:  Recent Labs Lab 05/18/14 1105  AST 31  ALT 36  ALKPHOS 67  BILITOT 0.9  PROT 6.8  ALBUMIN 3.7   CBC:  Recent Labs Lab 05/18/14 1105 05/20/14 0645  WBC 18.7* 8.8  NEUTROABS 16.4*  --   HGB 13.0 11.3*  HCT 41.6 36.2*  MCV 97.2 95.0  PLT 191 156   Cardiac Enzymes:  Recent Labs Lab 05/18/14 1545  TROPONINI 0.03   BNP (last 3 results)  Recent Labs  04/27/14 1718 04/29/14 0522 05/18/14 1105  BNP 1434.8* 1174.5* 990.6*    CBG:  Recent Labs Lab 05/19/14 1236 05/19/14 1613 05/19/14 2314 05/20/14 0823 05/20/14 1221  GLUCAP 176* 184* 115* 92 125*    Recent Results (from the past 240 hour(s))  Blood culture (routine x 2)     Status: None (Preliminary result)   Collection Time: 05/18/14  1:40 PM  Result Value Ref Range Status   Specimen Description BLOOD LEFT HAND  Final   Special Requests BOTTLES DRAWN AEROBIC ONLY 5CC  Final   Culture   Final           BLOOD CULTURE RECEIVED NO GROWTH TO DATE CULTURE WILL BE HELD FOR 5 DAYS BEFORE ISSUING A FINAL NEGATIVE REPORT Performed at [facility]    Report Status PENDING  Incomplete  Blood culture (routine x 2)     Status: None (Preliminary result)   Collection Time: 05/18/14  1:45 PM  Result Value Ref Range Status   Specimen Description BLOOD RIGHT ARM  Final   Special Requests BOTTLES DRAWN AEROBIC AND ANAEROBIC 5CC  Final   Culture   Final           BLOOD CULTURE RECEIVED NO GROWTH TO DATE CULTURE WILL BE HELD FOR 5 DAYS BEFORE ISSUING A FINAL NEGATIVE REPORT Performed at [facility]    Report Status PENDING  Incomplete  MRSA PCR Screening     Status: None   Collection Time: 05/18/14  2:25 PM  Result Value Ref Range Status   MRSA by PCR NEGATIVE NEGATIVE Final    Comment:        The GeneXpert MRSA Assay (FDA approved for NASAL specimens only), is one  component of a comprehensive MRSA colonization surveillance program. It is not intended to diagnose MRSA infection nor to guide or monitor treatment for MRSA infections.      Studies:  Recent x-ray studies have been reviewed in detail by the Attending Physician  Scheduled Meds:  Scheduled Meds: . apixaban  2.5 mg Oral BID  . atorvastatin  40 mg Oral Daily  . carvedilol  3.125 mg Oral BID WC  . escitalopram  10 mg Oral Daily  . furosemide  80 mg Intravenous BID  . insulin aspart  0-9 Units Subcutaneous TID WC  . insulin detemir  18 Units Subcutaneous QHS  . metolazone  2.5 mg Oral Daily  . potassium chloride  40 mEq Oral Daily  . pramipexole  0.5 mg Oral  TID  . sodium chloride  10-40 mL Intracatheter Q12H  . tamsulosin  0.4 mg Oral Daily    Time spent on care of this patient: 35 mins   Lacy Duverney , [facility]  [facility] Office  [phone] Pager - [phone]  On-Call/Text Page:      [email or web address]      password [email or web address]  If 7PM-7AM, please contact night-coverage [email or web address] Password [email or web address] 05/20/2014, 3:13 PM   LOS: 2 days

## 2014-05-20 NOTE — Progress Notes (Addendum)
Patient ID: [name], male   DOB: 19-Jul-1928, 80 y.o.   MRN: [id number]   SUBJECTIVE:   He remains in NSR. Started on amiodarone. PICC placed. Mild dyspnea. Weight up, some diuresis but not marked.   Scheduled Meds: . apixaban  2.5 mg Oral BID  . atorvastatin  40 mg Oral Daily  . carvedilol  3.125 mg Oral BID WC  . escitalopram  10 mg Oral Daily  . furosemide  80 mg Intravenous BID  . insulin aspart  0-9 Units Subcutaneous TID WC  . insulin detemir  18 Units Subcutaneous QHS  . potassium chloride  40 mEq Oral Daily  . pramipexole  0.5 mg Oral TID  . ramipril  1.25 mg Oral Daily  . sodium chloride  10-40 mL Intracatheter Q12H  . tamsulosin  0.4 mg Oral Daily   Continuous Infusions: . amiodarone 30 mg/hr (05/19/14 2055)   PRN Meds:.acetaminophen, ondansetron (ZOFRAN) IV, sodium chloride    Filed Vitals:   05/20/14 0000 05/20/14 0400 05/20/14 0500 05/20/14 0824  BP: 91/64   112/67  Pulse: 103   98  Temp: 97.5 F (36.4 C) 97.3 F (36.3 C)  98.4 F (36.9 C)  TempSrc: Oral Oral  Oral  Resp: 19   18  Height:      Weight:   225 lb 5 oz (102.2 kg)   SpO2:    98%    Intake/Output Summary (Last 24 hours) at 05/20/14 0851 Last data filed at 05/20/14 0753  Gross per 24 hour  Intake    460 ml  Output   2475 ml  Net  -2015 ml    LABS: Basic Metabolic Panel:  Recent Labs  05/19/14 0409 05/20/14 0645  NA 143 142  K 4.0 4.1  CL 106 103  CO2 30 33*  GLUCOSE 107* 102*  BUN 40* 40*  CREATININE 1.74* 1.72*  CALCIUM 8.7 9.0  MG  --  2.1   Liver Function Tests:  Recent Labs  05/18/14 1105  AST 31  ALT 36  ALKPHOS 67  BILITOT 0.9  PROT 6.8  ALBUMIN 3.7   No results for input(s): LIPASE, AMYLASE in the last 72 hours. CBC:  Recent Labs  05/18/14 1105 05/20/14 0645  WBC 18.7* 8.8  NEUTROABS 16.4*  --   HGB 13.0 11.3*  HCT 41.6 36.2*  MCV 97.2 95.0  PLT 191 156   Cardiac Enzymes:  Recent Labs  05/18/14 1545  TROPONINI 0.03   BNP: Invalid  input(s): POCBNP D-Dimer: No results for input(s): DDIMER in the last 72 hours. Hemoglobin A1C:  Recent Labs  05/19/14 1315  HGBA1C 6.3*   Fasting Lipid Panel: No results for input(s): CHOL, HDL, LDLCALC, TRIG, CHOLHDL, LDLDIRECT in the last 72 hours. Thyroid Function Tests: No results for input(s): TSH, T4TOTAL, T3FREE, THYROIDAB in the last 72 hours.  Invalid input(s): FREET3 Anemia Panel: No results for input(s): VITAMINB12, FOLATE, FERRITIN, TIBC, IRON, RETICCTPCT in the last 72 hours.  RADIOLOGY: Dg Chest 2 View  04/27/2014   CLINICAL DATA:  Shortness of breath for 2 weeks  EXAM: CHEST  2 VIEW  COMPARISON:  03/25/2014  FINDINGS: Stable cardiomegaly and aortic tortuosity post CABG. Stable positioning of biventricular ICD/ pacer leads from the left. There is new interstitial opacity with [employer]. No pleural effusion or pneumonia. No pneumothorax.  IMPRESSION: Mild CHF.   Electronically Signed   By: Monte Fantasia M.D.   On: 04/27/2014 17:30   US Abdomen Limited  05/18/2014  CLINICAL DATA:  Abdominal distention.  Ascites.  EXAM: LIMITED ABDOMEN ULTRASOUND FOR ASCITES  TECHNIQUE: Limited ultrasound survey for ascites was performed in all four abdominal quadrants.  COMPARISON:  03/01/2014 renal ultrasound.  FINDINGS: No ascites is visible.  IMPRESSION: Negative for ascites.   Electronically Signed   By: Dereck Ligas M.D.   On: 05/18/2014 20:37   Dg Chest Port 1 View  05/19/2014   CLINICAL DATA:  RIGHT side PICC line placement, history hypertension, CHF, diabetes, ischemic cardiomyopathy, MI, atrial fibrillation  EXAM: PORTABLE CHEST - 1 VIEW  COMPARISON:  Portable exam 1819 hours compared to 05/18/2014  FINDINGS: RIGHT arm PICC line tip projects over proximal SVC.  LEFT subclavian transvenous pacemaker/AICD leads project over RIGHT atrium, RIGHT ventricle and coronary sinus.  Enlargement of cardiac silhouette post CABG.  Slight pulmonary vascular congestion.  Calcified tortuous  aorta.  Minimal RIGHT basilar atelectasis.  Lungs otherwise clear.  No pleural effusion or pneumothorax.  IMPRESSION: Enlargement of cardiac silhouette post CABG and AICD.  Tip of RIGHT arm PICC line projects over proximal SVC.   Electronically Signed   By: Lavonia Dana M.D.   On: 05/19/2014 20:11   Dg Chest Port 1 View  05/18/2014   CLINICAL DATA:  Shortness of breath, history of atrial fibrillation  EXAM: PORTABLE CHEST - 1 VIEW  COMPARISON:  04/27/2014  FINDINGS: Cardiomegaly again noted. Status post CABG. Dual lead cardiac pacemaker is unchanged in position. Central mild vascular congestion without convincing pulmonary edema. There is streaky right basilar atelectasis or infiltrate.  IMPRESSION: Cardiomegaly. Dual lead cardiac pacemaker in place. Status post CABG. Central mild vascular congestion without convincing pulmonary edema. Streaky right basilar atelectasis or early infiltrate.   Electronically Signed   By: Lahoma Crocker M.D.   On: 05/18/2014 11:39    PHYSICAL EXAM General: NAD Neck: JVP to jaw. No thyromegaly or thyroid nodule.  Lungs: Slight crackles at bases.   CV: Nondisplaced PMI.  Heart mildly tachy, irregular S1/S2, no S3/S4, no murmur.  1+ edema 1/2 to knees bilaterally.   Abdomen: Soft, nontender, no hepatosplenomegaly, no distention.  Neurologic: Alert and oriented x 3.  Psych: Normal affect. Extremities: No clubbing or cyanosis.  TELEMETRY: Reviewed telemetry pt in atrial fibrillation  ASSESSMENT AND PLAN: 1. Acute on chronic systolic CHF: Ischemic cardiomyopathy. EF 20-25% on 2/16 echo. He has missed his pm Lasix x 4 days and wife shows record of his weight steadily increasing. He has diuresed some with IV Lasix but he remains volume overloaded on exam with distended abdomen, peripheral edema, and JVD. Abdominal US showed no ascites. He has a [employer], political party CRT-D device, but he has gone into atrial fibrillation with RVR and is not BiV pacing now. Volume status elevated.  - Continue  Lasix 80 mg IV bid + addition of 2.5 mg metolazone.  - Soft BP with afib/RVR, cut back on Coreg and ramipril already. Somewhat better this morning.   2. CAD: h/o CABG. No chest pain. He is on Eliquis so not on ASA. Continue statin.  3. Atrial fibrillation: Paroxysmal, went into atrial fibrillation again. Remains on amiodarone drip. Plan for DC-CV today.  He has been on Eliquis chronically without missing doses.  4. CKD: Creatinine currently at baseline. Follow closely with diuresis.    CLEGG,AMY NP-C  05/20/2014 8:51 AM   Patient seen with NP, agree with the above note.  Patient remains volume overloaded on exam and in atrial fibrillation with loss of BiV pacing.  He did  not have a marked diuresis yesterday.  Creatinine remains stable.  BP somewhat better.   - DCCV today => has [employer] device that will need to be checked.  Has been on Eliquis > 1 month without missing doses so no TEE.  - Hold ramipril for now.  - Continue IV Lasix, add metolazone.   - Has PICC now, monitor CVPs and will send co-ox once back in NSR.   [name] 05/20/2014 9:17 AM

## 2014-05-20 NOTE — Progress Notes (Signed)
Pt being transferred to 3W. Wife made aware of transfer and called report on pt. Pt stable.

## 2014-05-20 NOTE — Progress Notes (Signed)
[name] stated that he did not want to wear his CPAP still at this time.

## 2014-05-20 NOTE — Anesthesia Preprocedure Evaluation (Addendum)
Anesthesia Evaluation  Patient identified by MRN, date of birth, ID band Patient awake    Reviewed: Allergy & Precautions, NPO status , Patient's Chart, lab work & pertinent test results  Airway Mallampati: II       Dental  (+) Teeth Intact, Dental Advisory Given   Pulmonary shortness of breath, sleep apnea , former smoker (quit 1980 40 pack year),  breath sounds clear to auscultation  Pulmonary exam normal       Cardiovascular hypertension, Pt. on medications + CAD and +CHF + Cardiac Defibrillator (Has had several, resently updated) Rhythm:Irregular Rate:Tachycardia  ECHO 04/28/2014 EF 0000000, Severe Diastolic and systolic failure, BMP [id number] this admission, had missed some lasix doses   Neuro/Psych Depression    GI/Hepatic   Endo/Other  diabetes, Type 2  Renal/GU Creat 1.7, GFR 40     Musculoskeletal   Abdominal (+)  Abdomen: soft.    Peds  Hematology   Anesthesia Other Findings   Reproductive/Obstetrics                           Anesthesia Physical Anesthesia Plan  ASA: IV  Anesthesia Plan: MAC   Post-op Pain Management:    Induction: Intravenous  Airway Management Planned: Nasal Cannula  Additional Equipment:   Intra-op Plan:   Post-operative Plan:   Informed Consent: I have reviewed the patients History and Physical, chart, labs and discussed the procedure including the risks, benefits and alternatives for the proposed anesthesia with the patient or authorized representative who has indicated his/her understanding and acceptance.     Plan Discussed with:   Anesthesia Plan Comments: (Very low EF, consider Ketamine with propofol, low dose)        Anesthesia Quick Evaluation

## 2014-05-20 NOTE — Progress Notes (Signed)
05/20/2014 CVP at 1200 was 17 and at 1523 was 18. Rico Sheehan RN

## 2014-05-20 NOTE — Progress Notes (Signed)
Wife notified of pts transfer

## 2014-05-21 ENCOUNTER — Encounter (HOSPITAL_COMMUNITY): Payer: Self-pay | Admitting: Internal Medicine

## 2014-05-21 DIAGNOSIS — I5023 Acute on chronic systolic (congestive) heart failure: Secondary | ICD-10-CM

## 2014-05-21 DIAGNOSIS — I509 Heart failure, unspecified: Secondary | ICD-10-CM

## 2014-05-21 DIAGNOSIS — D72829 Elevated white blood cell count, unspecified: Secondary | ICD-10-CM

## 2014-05-21 DIAGNOSIS — I679 Cerebrovascular disease, unspecified: Secondary | ICD-10-CM

## 2014-05-21 LAB — BASIC METABOLIC PANEL
ANION GAP: 9 (ref 5–15)
BUN: 38 mg/dL — ABNORMAL HIGH (ref 6–23)
CHLORIDE: 100 mmol/L (ref 96–112)
CO2: 32 mmol/L (ref 19–32)
Calcium: 9.1 mg/dL (ref 8.4–10.5)
Creatinine, Ser: 1.8 mg/dL — ABNORMAL HIGH (ref 0.50–1.35)
GFR calc Af Amer: 38 mL/min — ABNORMAL LOW (ref >90)
GFR calc non Af Amer: 33 mL/min — ABNORMAL LOW (ref >90)
GLUCOSE: 160 mg/dL — AB (ref 70–99)
Potassium: 3.8 mmol/L (ref 3.5–5.1)
Sodium: 141 mmol/L (ref 135–145)

## 2014-05-21 LAB — GLUCOSE, CAPILLARY
GLUCOSE-CAPILLARY: 142 mg/dL — AB (ref 70–99)
GLUCOSE-CAPILLARY: 179 mg/dL — AB (ref 70–99)
GLUCOSE-CAPILLARY: 125 mg/dL — AB (ref 70–99)
Glucose-Capillary: 143 mg/dL — ABNORMAL HIGH (ref 70–99)

## 2014-05-21 LAB — CBC
HEMATOCRIT: 34.4 % — AB (ref 39.0–52.0)
Hemoglobin: 10.8 g/dL — ABNORMAL LOW (ref 13.0–17.0)
MCH: 29.5 pg (ref 26.0–34.0)
MCHC: 31.4 g/dL (ref 30.0–36.0)
MCV: 94 fL (ref 78.0–100.0)
Platelets: 155 10*3/uL (ref 150–400)
RBC: 3.66 MIL/uL — ABNORMAL LOW (ref 4.22–5.81)
RDW: 14.1 % (ref 11.5–15.5)
WBC: 9 10*3/uL (ref 4.0–10.5)

## 2014-05-21 LAB — CARBOXYHEMOGLOBIN
Carboxyhemoglobin: 1.4 % (ref 0.5–1.5)
Methemoglobin: 0.9 % (ref 0.0–1.5)
O2 SAT: 56.4 %
TOTAL HEMOGLOBIN: 11.2 g/dL — AB (ref 13.5–18.0)

## 2014-05-21 MED ORDER — FUROSEMIDE 10 MG/ML IJ SOLN
10.0000 mg/h | INTRAVENOUS | Status: DC
  Administered 2014-05-21 – 2014-05-22 (×2): 10 mg/h via INTRAVENOUS
  Filled 2014-05-21 (×3): qty 25

## 2014-05-21 MED ORDER — HYDRALAZINE HCL 10 MG PO TABS
10.0000 mg | ORAL_TABLET | Freq: Three times a day (TID) | ORAL | Status: DC
  Administered 2014-05-21 – 2014-05-22 (×5): 10 mg via ORAL
  Filled 2014-05-21 (×6): qty 1

## 2014-05-21 MED ORDER — ISOSORBIDE MONONITRATE ER 30 MG PO TB24
30.0000 mg | ORAL_TABLET | Freq: Every day | ORAL | Status: DC
  Administered 2014-05-21 – 2014-05-24 (×4): 30 mg via ORAL
  Filled 2014-05-21 (×4): qty 1

## 2014-05-21 MED ORDER — FUROSEMIDE 10 MG/ML IJ SOLN
40.0000 mg | Freq: Once | INTRAMUSCULAR | Status: AC
  Administered 2014-05-21: 40 mg via INTRAVENOUS
  Filled 2014-05-21: qty 4

## 2014-05-21 MED ORDER — DIGOXIN 125 MCG PO TABS
0.0625 mg | ORAL_TABLET | Freq: Every day | ORAL | Status: DC
  Administered 2014-05-21 – 2014-05-24 (×4): 0.0625 mg via ORAL
  Filled 2014-05-21 (×4): qty 1

## 2014-05-21 NOTE — Progress Notes (Signed)
Patient ID: [name], male   DOB: [birth year], 81 y.o.   MRN: [id number]   SUBJECTIVE:   DCCV on 3/3, back in A-V sequentially paced rhythm.  He did not diurese well yesterday, weight stable.  CVP 17.  Co-ox relatively low at 56% this morning.  He feels better out of atrial fibrillation.   Scheduled Meds: . apixaban  2.5 mg Oral BID  . atorvastatin  40 mg Oral Daily  . carvedilol  3.125 mg Oral BID WC  . digoxin  0.0625 mg Oral Daily  . escitalopram  10 mg Oral Daily  . furosemide  40 mg Intravenous Once  . insulin aspart  0-9 Units Subcutaneous TID WC  . insulin detemir  18 Units Subcutaneous QHS  . metolazone  2.5 mg Oral Daily  . potassium chloride  40 mEq Oral Daily  . pramipexole  0.5 mg Oral TID  . sodium chloride  10-40 mL Intracatheter Q12H  . tamsulosin  0.4 mg Oral Daily   Continuous Infusions: . amiodarone 30 mg/hr (05/20/14 2120)  . furosemide (LASIX) infusion     PRN Meds:.acetaminophen, ondansetron (ZOFRAN) IV, sodium chloride    Filed Vitals:   05/20/14 1956 05/20/14 2145 05/21/14 0001 05/21/14 0445  BP: 128/65 136/70 102/48 145/78  Pulse: 68 70 67 72  Temp: 97.7 F (36.5 C) 98 F (36.7 C) 97.6 F (36.4 C) 98 F (36.7 C)  TempSrc: Oral Oral Oral Oral  Resp: 15 20 17 18   Height:  6\' 2"  (1.88 m)    Weight:  221 lb 12.8 oz (100.608 kg)  221 lb 4.8 oz (100.381 kg)  SpO2: 95%  96% 98%    Intake/Output Summary (Last 24 hours) at 05/21/14 0803 Last data filed at 05/21/14 0030  Gross per 24 hour  Intake    770 ml  Output   1025 ml  Net   -255 ml    LABS: Basic Metabolic Panel:  Recent Labs  05/20/14 0645 05/21/14 0500  NA 142 141  K 4.1 3.8  CL 103 100  CO2 33* 32  GLUCOSE 102* 160*  BUN 40* 38*  CREATININE 1.72* 1.80*  CALCIUM 9.0 9.1  MG 2.1  --    Liver Function Tests:  Recent Labs  05/18/14 1105  AST 31  ALT 36  ALKPHOS 67  BILITOT 0.9  PROT 6.8  ALBUMIN 3.7   No results for input(s): LIPASE, AMYLASE in the last 72  hours. CBC:  Recent Labs  05/18/14 1105 05/20/14 0645 05/21/14 0500  WBC 18.7* 8.8 9.0  NEUTROABS 16.4*  --   --   HGB 13.0 11.3* 10.8*  HCT 41.6 36.2* 34.4*  MCV 97.2 95.0 94.0  PLT 191 156 155   Cardiac Enzymes:  Recent Labs  05/18/14 1545  TROPONINI 0.03   BNP: Invalid input(s): POCBNP D-Dimer: No results for input(s): DDIMER in the last 72 hours. Hemoglobin A1C:  Recent Labs  05/19/14 1315  HGBA1C 6.3*   Fasting Lipid Panel: No results for input(s): CHOL, HDL, LDLCALC, TRIG, CHOLHDL, LDLDIRECT in the last 72 hours. Thyroid Function Tests: No results for input(s): TSH, T4TOTAL, T3FREE, THYROIDAB in the last 72 hours.  Invalid input(s): FREET3 Anemia Panel: No results for input(s): VITAMINB12, FOLATE, FERRITIN, TIBC, IRON, RETICCTPCT in the last 72 hours.  RADIOLOGY: Dg Chest 2 View  04/27/2014   CLINICAL DATA:  Shortness of breath for 2 weeks  EXAM: CHEST  2 VIEW  COMPARISON:  03/25/2014  FINDINGS: Stable cardiomegaly and aortic  tortuosity post CABG. Stable positioning of biventricular ICD/ pacer leads from the left. There is new interstitial opacity with [employer]. No pleural effusion or pneumonia. No pneumothorax.  IMPRESSION: Mild CHF.   Electronically Signed   By: Monte Fantasia M.D.   On: 04/27/2014 17:30   US Abdomen Limited  05/18/2014   CLINICAL DATA:  Abdominal distention.  Ascites.  EXAM: LIMITED ABDOMEN ULTRASOUND FOR ASCITES  TECHNIQUE: Limited ultrasound survey for ascites was performed in all four abdominal quadrants.  COMPARISON:  03/01/2014 renal ultrasound.  FINDINGS: No ascites is visible.  IMPRESSION: Negative for ascites.   Electronically Signed   By: Dereck Ligas M.D.   On: 05/18/2014 20:37   Dg Chest Port 1 View  05/19/2014   CLINICAL DATA:  RIGHT side PICC line placement, history hypertension, CHF, diabetes, ischemic cardiomyopathy, MI, atrial fibrillation  EXAM: PORTABLE CHEST - 1 VIEW  COMPARISON:  Portable exam 1819 hours compared to  05/18/2014  FINDINGS: RIGHT arm PICC line tip projects over proximal SVC.  LEFT subclavian transvenous pacemaker/AICD leads project over RIGHT atrium, RIGHT ventricle and coronary sinus.  Enlargement of cardiac silhouette post CABG.  Slight pulmonary vascular congestion.  Calcified tortuous aorta.  Minimal RIGHT basilar atelectasis.  Lungs otherwise clear.  No pleural effusion or pneumothorax.  IMPRESSION: Enlargement of cardiac silhouette post CABG and AICD.  Tip of RIGHT arm PICC line projects over proximal SVC.   Electronically Signed   By: Lavonia Dana M.D.   On: 05/19/2014 20:11   Dg Chest Port 1 View  05/18/2014   CLINICAL DATA:  Shortness of breath, history of atrial fibrillation  EXAM: PORTABLE CHEST - 1 VIEW  COMPARISON:  04/27/2014  FINDINGS: Cardiomegaly again noted. Status post CABG. Dual lead cardiac pacemaker is unchanged in position. Central mild vascular congestion without convincing pulmonary edema. There is streaky right basilar atelectasis or infiltrate.  IMPRESSION: Cardiomegaly. Dual lead cardiac pacemaker in place. Status post CABG. Central mild vascular congestion without convincing pulmonary edema. Streaky right basilar atelectasis or early infiltrate.   Electronically Signed   By: Lahoma Crocker M.D.   On: 05/18/2014 11:39    PHYSICAL EXAM General: NAD Neck: JVP to jaw. No thyromegaly or thyroid nodule.  Lungs: Slight crackles at bases.   CV: Nondisplaced PMI.  Heart regular S1/S2, no S3/S4, no murmur.  1+ edema 1/2 to knees bilaterally.   Abdomen: Soft, nontender, no hepatosplenomegaly, no distention.  Neurologic: Alert and oriented x 3.  Psych: Normal affect. Extremities: No clubbing or cyanosis.  TELEMETRY: Reviewed telemetry pt in atrial fibrillation  ASSESSMENT AND PLAN: 1. Acute on chronic systolic CHF: Ischemic cardiomyopathy. EF 20-25% on 2/16 echo. He had missed his pm Lasix x 4 days and wife shows record of his weight steadily increasing. He has diuresed some with IV  Lasix boluses but he remains volume overloaded on exam. Abdominal US showed no ascites. He has a [employer], political party CRT-D device.  CVP 17 and co-ox 56%.   - Co-ox on the lower side but just cardioverted from atrial fibrillation so not excited about using milrinone.  Will add low dose digoxin for now and hold off on IV inotropes for the time being. Will repeat co-ox this morning and tomorrow morning, if he diureses poorly today and/or creatinine rises significantly, may need to use milrinone, reassess in am.  - Start Lasix bolus 40 mg IV x 1 then start gtt at 10 mg/hr.  Will get metolazone x 1 this morning as well.  -  Ramipril held with elevated creatinine, can use hydralazine/nitrates for afterload reduction for now.    2. CAD: h/o CABG. No chest pain. He is on Eliquis so not on ASA. Continue statin.  3. Atrial fibrillation: Paroxysmal, went into atrial fibrillation again this admit and cardioverted to NSR. Remains on amiodarone drip.   4. CKD: Creatinine currently at baseline. Follow closely with diuresis.    [name]  05/21/2014 8:03 AM

## 2014-05-21 NOTE — Progress Notes (Signed)
Progress Note  [name] [id number] DOB: [birth year] DOA: 05/18/2014 PCP: Lottie Dawson, MD  Admit HPI / Brief Narrative: 81 year old male w/ a history of CAD s/p CABG, ischemic cardiomyopathy, paroxysmal atrial fibrillation on Eliquis, CKD stage III, diabetes mellitus type 2, hypertension, dyslipidemia, and OSA on CPAP who presented to the ED with worsening shortness of breath, weight gain, and peripheral edema.  Patient with recent hospitalization 04/27/14-04/30/14 due to a CHF exacerbation, discharged on Lasix 60 MG twice a day.  Due to some confusion had not taken his nightly dose of Lasix for 4 days.  En route to the ED patient was placed on BiPAP for hypoxia with O2 sats of 86% and placed on nitro drip for elevated blood pressure.  In the ED, WBCs 18.7, BNP 990, CXR with early infiltrate.  Heart failure team was consulted, and patient was admitted for CHF exacerbation and possible HCAP. 3/3- underwent DCCV for Afib despite Amio gtt.  HPI/Subjective: A and O x4 -  Feels that breathing back to baseline, states not requiring additional O2.  Denies cp, n/v, or abdom pain.    Assessment/Plan:  Acute Systolic CHF exacerbation -missed 4 doses of 60mg  lasix at home -2D echo- Q000111Q, grade 2 diastolic dysfunction -Discussed with Dr. Algernon Huxley, patient will be placed on Lasix drip. -Daily weight and intake/output monitoring. Lost 4 pounds since admission.  ?HCAP  -remains afebrile, no leukocytosis, low procalcitonin, weaned off [place] O2 to RA -rapidly improving with diuresis- stopped abx 3/2- follow clinically -Strep/legionella/ influenza  negative  Hypertension -Blood pressure had improved  -per Cards- decreased dosage BP meds Coreg 3.125 and Ramipril 1.25 daily  -Hold Lasix for systolic blood pressure less than 85  Paroxysmal Atrial fibrillation -s/p CRT device. 3/1- went into sustained afib with RVR despite Amiodarone gtt -per Cards- underwent DCCV 3/3, placed on Amio gtt; continue  CVP measuring -Continue home Eliquis  Hypokalemia -resolved  Abdominal distention -Complaining about constipation, we'll start laxative.  CAD/ischemic cardiomyopathy -Status post CABG. -not on ASA (on Eliquis), continue Lipitor  CKD, stage III -creatinine 1.72, baseline about 1.4-1.7  -repeat BMET in am  Diabetes mellitus type II controlled -CBGs controlled -3/2 Hemoglobin A1c = 6.3  -Continue Levemir 18 units, sensitive SSI  Dyslipidemia -cont Lipitor  Restless leg syndrome Continue Mirapex daily  Depression Continue Lexapro daily  OSA -continue home CPAP   Code Status: DNR Family Communication: Wife at bedsie Disposition Plan: SDU for amio infusion  Consultants: Cardiology Heart Failure team  Procedure/Significant Events: 3/2 - started on Amio infusion per cards for Afib with RVR 3/3- DCCV for continued Afib despite Amio gtt   Culture None Antibiotics: Vancomycin and Cefepime 3/1 > 3/2  DVT prophylaxis: On Eliquis   Devices Pacemaker   LINES / TUBES:  PICC for CVP    Objective: Blood pressure 119/55, pulse 60, temperature 97.6 F (36.4 C), temperature source Oral, resp. rate 19, height 6\' 2"  (1.88 m), weight 100.381 kg (221 lb 4.8 oz), SpO2 95 %.  Intake/Output Summary (Last 24 hours) at 05/21/14 1507 Last data filed at 05/21/14 1323  Gross per 24 hour  Intake   1090 ml  Output   2200 ml  Net  -1110 ml   Exam: General:Caucasian  male in NAD Lungs: CTAB, no crackles of wheeze Cardiovascular:RRR, no murmur gallop or rub, + JVD Abdomen: distended, nontender, soft, bowel sounds positive, no rebound, no ascites, no appreciable mass Extremities: No significant cyanosis, clubbing, 1+ edema L > R leg.   Data  Reviewed: Basic Metabolic Panel:  Recent Labs Lab 05/18/14 1105 05/19/14 0409 05/20/14 0645 05/21/14 0500  NA 146* 143 142 141  K 4.5 4.0 4.1 3.8  CL 107 106 103 100  CO2 27 30 33* 32  GLUCOSE 93 107* 102* 160*  BUN 34*  40* 40* 38*  CREATININE 1.87* 1.74* 1.72* 1.80*  CALCIUM 9.5 8.7 9.0 9.1  MG  --   --  2.1  --    Liver Function Tests:  Recent Labs Lab 05/18/14 1105  AST 31  ALT 36  ALKPHOS 67  BILITOT 0.9  PROT 6.8  ALBUMIN 3.7   CBC:  Recent Labs Lab 05/18/14 1105 05/20/14 0645 05/21/14 0500  WBC 18.7* 8.8 9.0  NEUTROABS 16.4*  --   --   HGB 13.0 11.3* 10.8*  HCT 41.6 36.2* 34.4*  MCV 97.2 95.0 94.0  PLT 191 156 155   Cardiac Enzymes:  Recent Labs Lab 05/18/14 1545  TROPONINI 0.03   BNP (last 3 results)  Recent Labs  04/27/14 1718 04/29/14 0522 05/18/14 1105  BNP 1434.8* 1174.5* 990.6*    CBG:  Recent Labs Lab 05/20/14 1221 05/20/14 1623 05/20/14 2158 05/21/14 0747 05/21/14 1209  GLUCAP 125* 180* 167* 142* 143*    Recent Results (from the past 240 hour(s))  Blood culture (routine x 2)     Status: None (Preliminary result)   Collection Time: 05/18/14  1:40 PM  Result Value Ref Range Status   Specimen Description BLOOD LEFT HAND  Final   Special Requests BOTTLES DRAWN AEROBIC ONLY 5CC  Final   Culture   Final           BLOOD CULTURE RECEIVED NO GROWTH TO DATE CULTURE WILL BE HELD FOR 5 DAYS BEFORE ISSUING A FINAL NEGATIVE REPORT Performed at [facility]    Report Status PENDING  Incomplete  Blood culture (routine x 2)     Status: None (Preliminary result)   Collection Time: 05/18/14  1:45 PM  Result Value Ref Range Status   Specimen Description BLOOD RIGHT ARM  Final   Special Requests BOTTLES DRAWN AEROBIC AND ANAEROBIC 5CC  Final   Culture   Final           BLOOD CULTURE RECEIVED NO GROWTH TO DATE CULTURE WILL BE HELD FOR 5 DAYS BEFORE ISSUING A FINAL NEGATIVE REPORT Performed at [facility]    Report Status PENDING  Incomplete  MRSA PCR Screening     Status: None   Collection Time: 05/18/14  2:25 PM  Result Value Ref Range Status   MRSA by PCR NEGATIVE NEGATIVE Final    Comment:        The GeneXpert MRSA Assay (FDA approved  for NASAL specimens only), is one component of a comprehensive MRSA colonization surveillance program. It is not intended to diagnose MRSA infection nor to guide or monitor treatment for MRSA infections.      Studies:  Recent x-ray studies have been reviewed in detail by the Attending Physician  Scheduled Meds:  Scheduled Meds: . apixaban  2.5 mg Oral BID  . atorvastatin  40 mg Oral Daily  . carvedilol  3.125 mg Oral BID WC  . digoxin  0.0625 mg Oral Daily  . escitalopram  10 mg Oral Daily  . hydrALAZINE  10 mg Oral 3 times per day  . insulin aspart  0-9 Units Subcutaneous TID WC  . insulin detemir  18 Units Subcutaneous QHS  . isosorbide mononitrate  30 mg  Oral Daily  . metolazone  2.5 mg Oral Daily  . potassium chloride  40 mEq Oral Daily  . pramipexole  0.5 mg Oral TID  . sodium chloride  10-40 mL Intracatheter Q12H  . tamsulosin  0.4 mg Oral Daily    Time spent on care of this patient: 35 mins   [place] [facility] Office  [phone] Pager - [phone]  On-Call/Text Page:      [email or web address]      password [email or web address]  If 7PM-7AM, please contact night-coverage [email or web address] Password [email or web address] 05/21/2014, 3:07 PM   LOS: 3 days

## 2014-05-22 DIAGNOSIS — N179 Acute kidney failure, unspecified: Secondary | ICD-10-CM

## 2014-05-22 LAB — BASIC METABOLIC PANEL
Anion gap: 10 (ref 5–15)
Anion gap: 8 (ref 5–15)
BUN: 41 mg/dL — AB (ref 6–23)
BUN: 45 mg/dL — AB (ref 6–23)
CHLORIDE: 91 mmol/L — AB (ref 96–112)
CO2: 37 mmol/L — AB (ref 19–32)
CO2: 39 mmol/L — ABNORMAL HIGH (ref 19–32)
CREATININE: 2.13 mg/dL — AB (ref 0.50–1.35)
Calcium: 9.4 mg/dL (ref 8.4–10.5)
Calcium: 9.3 mg/dL (ref 8.4–10.5)
Chloride: 92 mmol/L — ABNORMAL LOW (ref 96–112)
Creatinine, Ser: 2.1 mg/dL — ABNORMAL HIGH (ref 0.50–1.35)
GFR calc Af Amer: 32 mL/min — ABNORMAL LOW (ref >90)
GFR calc Af Amer: 31 mL/min — ABNORMAL LOW (ref >90)
GFR calc non Af Amer: 27 mL/min — ABNORMAL LOW (ref >90)
GFR calc non Af Amer: 27 mL/min — ABNORMAL LOW (ref >90)
Glucose, Bld: 191 mg/dL — ABNORMAL HIGH (ref 70–99)
Glucose, Bld: 162 mg/dL — ABNORMAL HIGH (ref 70–99)
Potassium: 3.3 mmol/L — ABNORMAL LOW (ref 3.5–5.1)
Potassium: 4.1 mmol/L (ref 3.5–5.1)
SODIUM: 139 mmol/L (ref 135–145)
Sodium: 138 mmol/L (ref 135–145)

## 2014-05-22 LAB — CBC
HCT: 37 % — ABNORMAL LOW (ref 39.0–52.0)
HEMOGLOBIN: 11.7 g/dL — AB (ref 13.0–17.0)
MCH: 29.3 pg (ref 26.0–34.0)
MCHC: 31.6 g/dL (ref 30.0–36.0)
MCV: 92.7 fL (ref 78.0–100.0)
Platelets: 148 10*3/uL — ABNORMAL LOW (ref 150–400)
RBC: 3.99 MIL/uL — AB (ref 4.22–5.81)
RDW: 13.9 % (ref 11.5–15.5)
WBC: 7.3 10*3/uL (ref 4.0–10.5)

## 2014-05-22 LAB — GLUCOSE, CAPILLARY
Glucose-Capillary: 101 mg/dL — ABNORMAL HIGH (ref 70–99)
Glucose-Capillary: 141 mg/dL — ABNORMAL HIGH (ref 70–99)
Glucose-Capillary: 236 mg/dL — ABNORMAL HIGH (ref 70–99)
Glucose-Capillary: 116 mg/dL — ABNORMAL HIGH (ref 70–99)

## 2014-05-22 LAB — CARBOXYHEMOGLOBIN
CARBOXYHEMOGLOBIN: 1.4 % (ref 0.5–1.5)
Methemoglobin: 1.1 % (ref 0.0–1.5)
O2 Saturation: 59.2 %
Total hemoglobin: 14.8 g/dL (ref 13.5–18.0)

## 2014-05-22 LAB — PROCALCITONIN: Procalcitonin: <0.1 ng/mL

## 2014-05-22 MED ORDER — MILRINONE IN DEXTROSE 20 MG/100ML IV SOLN: 0.2500 ug/kg/min | INTRAVENOUS | Status: DC

## 2014-05-22 MED ORDER — HYDRALAZINE HCL 25 MG PO TABS
25.0000 mg | ORAL_TABLET | Freq: Three times a day (TID) | ORAL | Status: DC
  Administered 2014-05-22 – 2014-05-24 (×6): 25 mg via ORAL
  Filled 2014-05-22 (×6): qty 1

## 2014-05-22 MED ORDER — MAGNESIUM HYDROXIDE 400 MG/5ML PO SUSP
30.0000 mL | Freq: Once | ORAL | Status: AC
  Administered 2014-05-22: 30 mL via ORAL
  Filled 2014-05-22: qty 30

## 2014-05-22 MED ORDER — POTASSIUM CHLORIDE CRYS ER 20 MEQ PO TBCR
40.0000 meq | EXTENDED_RELEASE_TABLET | Freq: Once | ORAL | Status: AC
  Administered 2014-05-22: 40 meq via ORAL
  Filled 2014-05-22: qty 2

## 2014-05-22 MED ORDER — POLYETHYLENE GLYCOL 3350 17 G PO PACK
17.0000 g | PACK | Freq: Every day | ORAL | Status: DC
  Administered 2014-05-22 – 2014-05-24 (×3): 17 g via ORAL
  Filled 2014-05-22 (×3): qty 1

## 2014-05-22 NOTE — Discharge Instructions (Signed)

## 2014-05-22 NOTE — Progress Notes (Signed)
Progress Note  [name] [id number] DOB: 25-Apr-1928 DOA: 05/18/2014 PCP: Lottie Dawson, MD  Admit HPI / Brief Narrative: 81 year old male w/ a history of CAD s/p CABG, ischemic cardiomyopathy, paroxysmal atrial fibrillation on Eliquis, CKD stage III, diabetes mellitus type 2, hypertension, dyslipidemia, and OSA on CPAP who presented to the ED with worsening shortness of breath, weight gain, and peripheral edema.  Patient with recent hospitalization 04/27/14-04/30/14 due to a CHF exacerbation, discharged on Lasix 60 MG twice a day.  Due to some confusion had not taken his nightly dose of Lasix for 4 days.  En route to the ED patient was placed on BiPAP for hypoxia with O2 sats of 86% and placed on nitro drip for elevated blood pressure.  In the ED, WBCs 18.7, BNP 990, CXR with early infiltrate.  Heart failure team was consulted, and patient was admitted for CHF exacerbation and possible HCAP. 3/3- underwent DCCV for Afib despite Amio gtt.  HPI/Subjective: Denies any shortness of breath, complains about constipation. Follow cardiology recommendation regarding continuation of high-dose of Lasix and Metolazone.  Assessment/Plan:  Acute Systolic CHF exacerbation -Missed 4 doses of 60mg  lasix at home -2D echo- Q000111Q, grade 2 diastolic dysfunction -Discussed with Dr. [name], patient will be placed on Lasix drip. -Daily weight and intake/output monitoring. Lost 10 pounds since yesterday and the initiation of the Lasix drip.  ?HCAP  -remains afebrile, no leukocytosis, low procalcitonin, weaned off  O2 to RA. -rapidly improving with diuresis- stopped abx 3/2- follow clinically. -Strep/legionella/ influenza  negative  Hypertension -Blood pressure had improved  -per Cards- decreased dosage BP meds Coreg 3.125 and Ramipril 1.25 daily  -Hold Lasix for systolic blood pressure less than 85  Paroxysmal Atrial fibrillation -s/p CRT device. 3/1- went into sustained afib with RVR despite  Amiodarone gtt -per Cards- underwent DCCV 3/3, placed on Amio gtt; continue CVP measuring -Continue home Eliquis  Hypokalemia -resolved  Abdominal distention -Complaining about constipation, we'll start laxative.  CAD/ischemic cardiomyopathy -Status post CABG. -not on ASA (on Eliquis), continue Lipitor  CKD, stage III -creatinine 1.72, baseline about 1.4-1.7  -Creatinine is worsening from 1.8 to 2.1  Diabetes mellitus type II controlled -CBGs controlled -3/2 Hemoglobin A1c = 6.3  -Continue Levemir 18 units, sensitive SSI  Dyslipidemia -cont Lipitor  Restless leg syndrome Continue Mirapex daily  Depression Continue Lexapro daily  OSA -continue home CPAP   Code Status: DNR Family Communication: Wife at bedsie Disposition Plan: SDU for amio infusion  Consultants: Cardiology Heart Failure team  Procedure/Significant Events: 3/2 - started on Amio infusion per cards for Afib with RVR 3/3- DCCV for continued Afib despite Amio gtt   Culture None Antibiotics: Vancomycin and Cefepime 3/1 > 3/2  DVT prophylaxis: On Eliquis   Devices Pacemaker   LINES / TUBES:  PICC for CVP    Objective: Blood pressure 130/67, pulse 60, temperature 98 F (36.7 C), temperature source Oral, resp. rate 20, height 6\' 2"  (1.88 m), weight 95.709 kg (211 lb), SpO2 95 %.  Intake/Output Summary (Last 24 hours) at 05/22/14 1014 Last data filed at 05/22/14 0914  Gross per 24 hour  Intake   1320 ml  Output   5200 ml  Net  -3880 ml   Exam: General:Caucasian  male in NAD Lungs: CTAB, no crackles of wheeze Cardiovascular:RRR, no murmur gallop or rub, + JVD Abdomen: distended, nontender, soft, bowel sounds positive, no rebound, no ascites, no appreciable mass Extremities: No significant cyanosis, clubbing, 1+ edema L > R leg.  Data Reviewed: Basic Metabolic Panel:  Recent Labs Lab 05/18/14 1105 05/19/14 0409 05/20/14 0645 05/21/14 0500 05/22/14 0525  NA 146* 143 142  141 138  K 4.5 4.0 4.1 3.8 3.3*  CL 107 106 103 100 91*  CO2 27 30 33* 32 37*  GLUCOSE 93 107* 102* 160* 191*  BUN 34* 40* 40* 38* 41*  CREATININE 1.87* 1.74* 1.72* 1.80* 2.10*  CALCIUM 9.5 8.7 9.0 9.1 9.4  MG  --   --  2.1  --   --    Liver Function Tests:  Recent Labs Lab 05/18/14 1105  AST 31  ALT 36  ALKPHOS 67  BILITOT 0.9  PROT 6.8  ALBUMIN 3.7   CBC:  Recent Labs Lab 05/18/14 1105 05/20/14 0645 05/21/14 0500 05/22/14 0525  WBC 18.7* 8.8 9.0 7.3  NEUTROABS 16.4*  --   --   --   HGB 13.0 11.3* 10.8* 11.7*  HCT 41.6 36.2* 34.4* 37.0*  MCV 97.2 95.0 94.0 92.7  PLT 191 156 155 148*   Cardiac Enzymes:  Recent Labs Lab 05/18/14 1545  TROPONINI 0.03   BNP (last 3 results)  Recent Labs  04/27/14 1718 04/29/14 0522 05/18/14 1105  BNP 1434.8* 1174.5* 990.6*    CBG:  Recent Labs Lab 05/20/14 2158 05/21/14 0747 05/21/14 1209 05/21/14 1621 05/21/14 2054  GLUCAP 167* 142* 143* 179* 125*    Recent Results (from the past 240 hour(s))  Blood culture (routine x 2)     Status: None (Preliminary result)   Collection Time: 05/18/14  1:40 PM  Result Value Ref Range Status   Specimen Description BLOOD LEFT HAND  Final   Special Requests BOTTLES DRAWN AEROBIC ONLY 5CC  Final   Culture   Final           BLOOD CULTURE RECEIVED NO GROWTH TO DATE CULTURE WILL BE HELD FOR 5 DAYS BEFORE ISSUING A FINAL NEGATIVE REPORT Performed at [facility]    Report Status PENDING  Incomplete  Blood culture (routine x 2)     Status: None (Preliminary result)   Collection Time: 05/18/14  1:45 PM  Result Value Ref Range Status   Specimen Description BLOOD RIGHT ARM  Final   Special Requests BOTTLES DRAWN AEROBIC AND ANAEROBIC 5CC  Final   Culture   Final           BLOOD CULTURE RECEIVED NO GROWTH TO DATE CULTURE WILL BE HELD FOR 5 DAYS BEFORE ISSUING A FINAL NEGATIVE REPORT Performed at [facility]    Report Status PENDING  Incomplete  MRSA PCR  Screening     Status: None   Collection Time: 05/18/14  2:25 PM  Result Value Ref Range Status   MRSA by PCR NEGATIVE NEGATIVE Final    Comment:        The GeneXpert MRSA Assay (FDA approved for NASAL specimens only), is one component of a comprehensive MRSA colonization surveillance program. It is not intended to diagnose MRSA infection nor to guide or monitor treatment for MRSA infections.      Studies:  Recent x-ray studies have been reviewed in detail by the Attending Physician  Scheduled Meds:  Scheduled Meds: . apixaban  2.5 mg Oral BID  . atorvastatin  40 mg Oral Daily  . carvedilol  3.125 mg Oral BID WC  . digoxin  0.0625 mg Oral Daily  . escitalopram  10 mg Oral Daily  . hydrALAZINE  10 mg Oral 3 times per day  .  insulin aspart  0-9 Units Subcutaneous TID WC  . insulin detemir  18 Units Subcutaneous QHS  . isosorbide mononitrate  30 mg Oral Daily  . metolazone  2.5 mg Oral Daily  . polyethylene glycol  17 g Oral Daily  . potassium chloride  40 mEq Oral Daily  . pramipexole  0.5 mg Oral TID  . sodium chloride  10-40 mL Intracatheter Q12H  . tamsulosin  0.4 mg Oral Daily    Time spent on care of this patient: 35 mins   [place] [facility] Office  [phone] Pager - [phone]  On-Call/Text Page:      [email or web address]      password [email or web address]  If 7PM-7AM, please contact night-coverage [email or web address] Password [email or web address] 05/22/2014, 10:14 AM   LOS: 4 days

## 2014-05-22 NOTE — Progress Notes (Signed)
Patient ID: [name], male   DOB: Jan 19, 1929, 81 y.o.   MRN: [id number]   SUBJECTIVE:   DCCV on 3/3, back in A-V sequentially paced rhythm.  Good diuresis yesterday with Lasix gtt + metolazone, weight down 10 lbs. I checked CVP today, it was 5.    Scheduled Meds: . apixaban  2.5 mg Oral BID  . atorvastatin  40 mg Oral Daily  . carvedilol  3.125 mg Oral BID WC  . digoxin  0.0625 mg Oral Daily  . escitalopram  10 mg Oral Daily  . hydrALAZINE  25 mg Oral 3 times per day  . insulin aspart  0-9 Units Subcutaneous TID WC  . insulin detemir  18 Units Subcutaneous QHS  . isosorbide mononitrate  30 mg Oral Daily  . polyethylene glycol  17 g Oral Daily  . potassium chloride  40 mEq Oral Daily  . potassium chloride  40 mEq Oral Once  . pramipexole  0.5 mg Oral TID  . sodium chloride  10-40 mL Intracatheter Q12H  . tamsulosin  0.4 mg Oral Daily   Continuous Infusions: . amiodarone 30 mg/hr (05/22/14 0932)   PRN Meds:.acetaminophen, ondansetron (ZOFRAN) IV, sodium chloride    Filed Vitals:   05/22/14 0505 05/22/14 0731 05/22/14 1133 05/22/14 1530  BP: 122/62 130/67 114/65 122/65  Pulse:   59 60  Temp: 97.4 F (36.3 C) 98 F (36.7 C) 97.8 F (36.6 C) 97.5 F (36.4 C)  TempSrc: Oral Oral Oral Oral  Resp: 22 20 18 18   Height: 6\' 2"  (1.88 m)     Weight: 211 lb (95.709 kg)     SpO2: 97% 95% 93% 95%    Intake/Output Summary (Last 24 hours) at 05/22/14 1537 Last data filed at 05/22/14 1531  Gross per 24 hour  Intake   1330 ml  Output   5000 ml  Net  -3670 ml    LABS: Basic Metabolic Panel:  Recent Labs  05/20/14 0645 05/21/14 0500 05/22/14 0525  NA 142 141 138  K 4.1 3.8 3.3*  CL 103 100 91*  CO2 33* 32 37*  GLUCOSE 102* 160* 191*  BUN 40* 38* 41*  CREATININE 1.72* 1.80* 2.10*  CALCIUM 9.0 9.1 9.4  MG 2.1  --   --    Liver Function Tests: No results for input(s): AST, ALT, ALKPHOS, BILITOT, PROT, ALBUMIN in the last 72 hours. No results for input(s):  LIPASE, AMYLASE in the last 72 hours. CBC:  Recent Labs  05/21/14 0500 05/22/14 0525  WBC 9.0 7.3  HGB 10.8* 11.7*  HCT 34.4* 37.0*  MCV 94.0 92.7  PLT 155 148*   Cardiac Enzymes: No results for input(s): CKTOTAL, CKMB, CKMBINDEX, TROPONINI in the last 72 hours. BNP: Invalid input(s): POCBNP D-Dimer: No results for input(s): DDIMER in the last 72 hours. Hemoglobin A1C: No results for input(s): HGBA1C in the last 72 hours. Fasting Lipid Panel: No results for input(s): CHOL, HDL, LDLCALC, TRIG, CHOLHDL, LDLDIRECT in the last 72 hours. Thyroid Function Tests: No results for input(s): TSH, T4TOTAL, T3FREE, THYROIDAB in the last 72 hours.  Invalid input(s): FREET3 Anemia Panel: No results for input(s): VITAMINB12, FOLATE, FERRITIN, TIBC, IRON, RETICCTPCT in the last 72 hours.  RADIOLOGY: Dg Chest 2 View  04/27/2014   CLINICAL DATA:  Shortness of breath for 2 weeks  EXAM: CHEST  2 VIEW  COMPARISON:  03/25/2014  FINDINGS: Stable cardiomegaly and aortic tortuosity post CABG. Stable positioning of biventricular ICD/ pacer leads from the left. There  is new interstitial opacity with [employer]. No pleural effusion or pneumonia. No pneumothorax.  IMPRESSION: Mild CHF.   Electronically Signed   By: Monte Fantasia M.D.   On: 04/27/2014 17:30   US Abdomen Limited  05/18/2014   CLINICAL DATA:  Abdominal distention.  Ascites.  EXAM: LIMITED ABDOMEN ULTRASOUND FOR ASCITES  TECHNIQUE: Limited ultrasound survey for ascites was performed in all four abdominal quadrants.  COMPARISON:  03/01/2014 renal ultrasound.  FINDINGS: No ascites is visible.  IMPRESSION: Negative for ascites.   Electronically Signed   By: Dereck Ligas M.D.   On: 05/18/2014 20:37   Dg Chest Port 1 View  05/19/2014   CLINICAL DATA:  RIGHT side PICC line placement, history hypertension, CHF, diabetes, ischemic cardiomyopathy, MI, atrial fibrillation  EXAM: PORTABLE CHEST - 1 VIEW  COMPARISON:  Portable exam 1819 hours compared  to 05/18/2014  FINDINGS: RIGHT arm PICC line tip projects over proximal SVC.  LEFT subclavian transvenous pacemaker/AICD leads project over RIGHT atrium, RIGHT ventricle and coronary sinus.  Enlargement of cardiac silhouette post CABG.  Slight pulmonary vascular congestion.  Calcified tortuous aorta.  Minimal RIGHT basilar atelectasis.  Lungs otherwise clear.  No pleural effusion or pneumothorax.  IMPRESSION: Enlargement of cardiac silhouette post CABG and AICD.  Tip of RIGHT arm PICC line projects over proximal SVC.   Electronically Signed   By: Lavonia Dana M.D.   On: 05/19/2014 20:11   Dg Chest Port 1 View  05/18/2014   CLINICAL DATA:  Shortness of breath, history of atrial fibrillation  EXAM: PORTABLE CHEST - 1 VIEW  COMPARISON:  04/27/2014  FINDINGS: Cardiomegaly again noted. Status post CABG. Dual lead cardiac pacemaker is unchanged in position. Central mild vascular congestion without convincing pulmonary edema. There is streaky right basilar atelectasis or infiltrate.  IMPRESSION: Cardiomegaly. Dual lead cardiac pacemaker in place. Status post CABG. Central mild vascular congestion without convincing pulmonary edema. Streaky right basilar atelectasis or early infiltrate.   Electronically Signed   By: Lahoma Crocker M.D.   On: 05/18/2014 11:39    PHYSICAL EXAM General: NAD Neck: JVP to jaw. No thyromegaly or thyroid nodule.  Lungs: Slight crackles at bases.   CV: Nondisplaced PMI.  Heart regular S1/S2, no S3/S4, no murmur.  1+ edema 1/2 to knees bilaterally.   Abdomen: Soft, nontender, no hepatosplenomegaly, no distention.  Neurologic: Alert and oriented x 3.  Psych: Normal affect. Extremities: No clubbing or cyanosis.  TELEMETRY: Reviewed telemetry pt in atrial fibrillation  ASSESSMENT AND PLAN: 1. Acute on chronic systolic CHF: Ischemic cardiomyopathy. EF 20-25% on 2/16 echo. He had missed his pm Lasix x 4 days and wife shows record of his weight steadily increasing. He diuresed some with IV  Lasix boluses but he remained volume overloaded on exam. Abdominal US showed no ascites. He has a [employer] CRT-D device.  He was started on Lasix gtt + metolazone yesterday and weight dropped 10 lbs.  Creatinine up to 2.1.  CVP 5 today, co-ox 59%.    - Stop metolazone and Lasix gtt today with CVP 5 and creatinine up to 2.1.   - Ramipril held with elevated creatinine, can increase hydralazine to 25 mg tid with Imdur today for afterload reduction.     2. CAD: h/o CABG. No chest pain. He is on Eliquis so not on ASA. Continue statin.  3. Atrial fibrillation: Paroxysmal, went into atrial fibrillation again this admit and cardioverted to NSR. Remains on amiodarone drip, convert over to po tomorrow.  4. CKD: Creatinine up today, CVP 5.  Hold diuretics, BMET in am.   [name]  05/22/2014 3:37 PM

## 2014-05-22 NOTE — Progress Notes (Signed)
Patient ambulated about 650 ft in hallway. No c/o pain, SOB or fatigue.

## 2014-05-23 LAB — BASIC METABOLIC PANEL
Anion gap: 11 (ref 5–15)
BUN: 42 mg/dL — AB (ref 6–23)
CALCIUM: 8.9 mg/dL (ref 8.4–10.5)
CHLORIDE: 86 mmol/L — AB (ref 96–112)
CO2: 38 mmol/L — ABNORMAL HIGH (ref 19–32)
CREATININE: 2.09 mg/dL — AB (ref 0.50–1.35)
GFR calc Af Amer: 32 mL/min — ABNORMAL LOW (ref >90)
GFR, EST NON AFRICAN AMERICAN: 28 mL/min — AB (ref >90)
Glucose, Bld: 331 mg/dL — ABNORMAL HIGH (ref 70–99)
POTASSIUM: 3.4 mmol/L — AB (ref 3.5–5.1)
SODIUM: 135 mmol/L (ref 135–145)

## 2014-05-23 LAB — CBC
HCT: 33.6 % — ABNORMAL LOW (ref 39.0–52.0)
Hemoglobin: 10.8 g/dL — ABNORMAL LOW (ref 13.0–17.0)
MCH: 29.7 pg (ref 26.0–34.0)
MCHC: 32.1 g/dL (ref 30.0–36.0)
MCV: 92.3 fL (ref 78.0–100.0)
Platelets: 146 10*3/uL — ABNORMAL LOW (ref 150–400)
RBC: 3.64 MIL/uL — ABNORMAL LOW (ref 4.22–5.81)
RDW: 13.9 % (ref 11.5–15.5)
WBC: 7.4 10*3/uL (ref 4.0–10.5)

## 2014-05-23 LAB — GLUCOSE, CAPILLARY
GLUCOSE-CAPILLARY: 177 mg/dL — AB (ref 70–99)
Glucose-Capillary: 173 mg/dL — ABNORMAL HIGH (ref 70–99)
Glucose-Capillary: 175 mg/dL — ABNORMAL HIGH (ref 70–99)
Glucose-Capillary: 184 mg/dL — ABNORMAL HIGH (ref 70–99)

## 2014-05-23 MED ORDER — POTASSIUM CHLORIDE CRYS ER 20 MEQ PO TBCR
40.0000 meq | EXTENDED_RELEASE_TABLET | Freq: Once | ORAL | Status: AC
  Administered 2014-05-23: 40 meq via ORAL
  Filled 2014-05-23: qty 2

## 2014-05-23 MED ORDER — TORSEMIDE 20 MG PO TABS
60.0000 mg | ORAL_TABLET | Freq: Every day | ORAL | Status: DC
  Administered 2014-05-23 – 2014-05-24 (×2): 60 mg via ORAL
  Filled 2014-05-23 (×2): qty 3

## 2014-05-23 MED ORDER — AMIODARONE HCL 200 MG PO TABS
200.0000 mg | ORAL_TABLET | Freq: Two times a day (BID) | ORAL | Status: DC
  Administered 2014-05-23 – 2014-05-24 (×3): 200 mg via ORAL
  Filled 2014-05-23 (×3): qty 1

## 2014-05-23 MED ORDER — INSULIN DETEMIR 100 UNIT/ML ~~LOC~~ SOLN
20.0000 [IU] | Freq: Every day | SUBCUTANEOUS | Status: DC
Start: 2014-05-23 — End: 2014-05-24
  Administered 2014-05-23: 20 [IU] via SUBCUTANEOUS
  Filled 2014-05-23 (×2): qty 0.2

## 2014-05-23 MED ORDER — SPIRONOLACTONE 25 MG PO TABS
12.5000 mg | ORAL_TABLET | Freq: Every day | ORAL | Status: DC
Start: 2014-05-23 — End: 2014-05-24
  Administered 2014-05-23 – 2014-05-24 (×2): 12.5 mg via ORAL
  Filled 2014-05-23 (×2): qty 1

## 2014-05-23 NOTE — Progress Notes (Signed)
Patient continues to refuse CPAP 

## 2014-05-23 NOTE — Progress Notes (Signed)
Patient ID: [name], male   DOB: 1928/10/28, 81 y.o.   MRN: [id number]   SUBJECTIVE:   DCCV on 3/3, back in A-V sequentially paced rhythm.  Of diuretics with rise in creatinine and fall in CVP yesterday. Weight stable and CVP 7 today.  He says he got short of breath getting up to go to bathroom this morning.   Scheduled Meds: . apixaban  2.5 mg Oral BID  . atorvastatin  40 mg Oral Daily  . carvedilol  3.125 mg Oral BID WC  . digoxin  0.0625 mg Oral Daily  . escitalopram  10 mg Oral Daily  . hydrALAZINE  25 mg Oral 3 times per day  . insulin aspart  0-9 Units Subcutaneous TID WC  . insulin detemir  20 Units Subcutaneous QHS  . isosorbide mononitrate  30 mg Oral Daily  . polyethylene glycol  17 g Oral Daily  . potassium chloride  40 mEq Oral Daily  . pramipexole  0.5 mg Oral TID  . sodium chloride  10-40 mL Intracatheter Q12H  . spironolactone  12.5 mg Oral Daily  . tamsulosin  0.4 mg Oral Daily  . torsemide  60 mg Oral Daily   Continuous Infusions: . amiodarone 30 mg/hr (05/23/14 1018)   PRN Meds:.acetaminophen, ondansetron (ZOFRAN) IV, sodium chloride    Filed Vitals:   05/23/14 0424 05/23/14 0726 05/23/14 1013 05/23/14 1113  BP: 119/51 126/61  113/63  Pulse: 60 60 60 60  Temp: 97.5 F (36.4 C) 98.7 F (37.1 C)  97.8 F (36.6 C)  TempSrc: Oral Oral  Oral  Resp: 22 18  22   Height:      Weight: 211 lb 12.8 oz (96.072 kg)     SpO2: 95% 93%  96%    Intake/Output Summary (Last 24 hours) at 05/23/14 1147 Last data filed at 05/23/14 1100  Gross per 24 hour  Intake 945.09 ml  Output   2200 ml  Net -1254.91 ml    LABS: Basic Metabolic Panel:  Recent Labs  05/22/14 1955 05/23/14 0440  NA 139 135  K 4.1 3.4*  CL 92* 86*  CO2 39* 38*  GLUCOSE 162* 331*  BUN 45* 42*  CREATININE 2.13* 2.09*  CALCIUM 9.3 8.9   Liver Function Tests: No results for input(s): AST, ALT, ALKPHOS, BILITOT, PROT, ALBUMIN in the last 72 hours. No results for input(s): LIPASE,  AMYLASE in the last 72 hours. CBC:  Recent Labs  05/22/14 0525 05/23/14 0440  WBC 7.3 7.4  HGB 11.7* 10.8*  HCT 37.0* 33.6*  MCV 92.7 92.3  PLT 148* 146*   Cardiac Enzymes: No results for input(s): CKTOTAL, CKMB, CKMBINDEX, TROPONINI in the last 72 hours. BNP: Invalid input(s): POCBNP D-Dimer: No results for input(s): DDIMER in the last 72 hours. Hemoglobin A1C: No results for input(s): HGBA1C in the last 72 hours. Fasting Lipid Panel: No results for input(s): CHOL, HDL, LDLCALC, TRIG, CHOLHDL, LDLDIRECT in the last 72 hours. Thyroid Function Tests: No results for input(s): TSH, T4TOTAL, T3FREE, THYROIDAB in the last 72 hours.  Invalid input(s): FREET3 Anemia Panel: No results for input(s): VITAMINB12, FOLATE, FERRITIN, TIBC, IRON, RETICCTPCT in the last 72 hours.  RADIOLOGY: Dg Chest 2 View  04/27/2014   CLINICAL DATA:  Shortness of breath for 2 weeks  EXAM: CHEST  2 VIEW  COMPARISON:  03/25/2014  FINDINGS: Stable cardiomegaly and aortic tortuosity post CABG. Stable positioning of biventricular ICD/ pacer leads from the left. There is new interstitial opacity with Awanda Mink  lines. No pleural effusion or pneumonia. No pneumothorax.  IMPRESSION: Mild CHF.   Electronically Signed   By: Monte Fantasia M.D.   On: 04/27/2014 17:30   US Abdomen Limited  05/18/2014   CLINICAL DATA:  Abdominal distention.  Ascites.  EXAM: LIMITED ABDOMEN ULTRASOUND FOR ASCITES  TECHNIQUE: Limited ultrasound survey for ascites was performed in all four abdominal quadrants.  COMPARISON:  03/01/2014 renal ultrasound.  FINDINGS: No ascites is visible.  IMPRESSION: Negative for ascites.   Electronically Signed   By: Dereck Ligas M.D.   On: 05/18/2014 20:37   Dg Chest Port 1 View  05/19/2014   CLINICAL DATA:  RIGHT side PICC line placement, history hypertension, CHF, diabetes, ischemic cardiomyopathy, MI, atrial fibrillation  EXAM: PORTABLE CHEST - 1 VIEW  COMPARISON:  Portable exam 1819 hours compared to  05/18/2014  FINDINGS: RIGHT arm PICC line tip projects over proximal SVC.  LEFT subclavian transvenous pacemaker/AICD leads project over RIGHT atrium, RIGHT ventricle and coronary sinus.  Enlargement of cardiac silhouette post CABG.  Slight pulmonary vascular congestion.  Calcified tortuous aorta.  Minimal RIGHT basilar atelectasis.  Lungs otherwise clear.  No pleural effusion or pneumothorax.  IMPRESSION: Enlargement of cardiac silhouette post CABG and AICD.  Tip of RIGHT arm PICC line projects over proximal SVC.   Electronically Signed   By: Lavonia Dana M.D.   On: 05/19/2014 20:11   Dg Chest Port 1 View  05/18/2014   CLINICAL DATA:  Shortness of breath, history of atrial fibrillation  EXAM: PORTABLE CHEST - 1 VIEW  COMPARISON:  04/27/2014  FINDINGS: Cardiomegaly again noted. Status post CABG. Dual lead cardiac pacemaker is unchanged in position. Central mild vascular congestion without convincing pulmonary edema. There is streaky right basilar atelectasis or infiltrate.  IMPRESSION: Cardiomegaly. Dual lead cardiac pacemaker in place. Status post CABG. Central mild vascular congestion without convincing pulmonary edema. Streaky right basilar atelectasis or early infiltrate.   Electronically Signed   By: Lahoma Crocker M.D.   On: 05/18/2014 11:39    PHYSICAL EXAM General: NAD Neck: JVP 7. No thyromegaly or thyroid nodule.  Lungs: Slight crackles at bases.   CV: Nondisplaced PMI.  Heart regular S1/S2, no S3/S4, no murmur.  1+ edema at ankles bilaterally.   Abdomen: Soft, nontender, no hepatosplenomegaly, no distention.  Neurologic: Alert and oriented x 3.  Psych: Normal affect. Extremities: No clubbing or cyanosis.  TELEMETRY: Reviewed telemetry pt a-paced, BiV-paced  ASSESSMENT AND PLAN: 1. Acute on chronic systolic CHF: Ischemic cardiomyopathy. EF 20-25% on 2/16 echo. He had missed his pm Lasix x 4 days and wife shows record of his weight steadily increasing. He diuresed some with IV Lasix boluses  but he remained volume overloaded on exam. Abdominal US showed no ascites. He has a [employer] CRT-D device.  He was started on Lasix gtt + metolazone and weight down considerably.  Diuretics stopped yesterday with creatinine rise.  Creatinine stable today, CVP 7 cm. Subjectively short of breath walking to bathroom this am.    - Start torsemide 60 mg daily today.   - Ambulate in hall.  - Ramipril held with elevated creatinine, continue hydralazine/Imdur.  - Continue low dose Coreg.  - May add spironolactone 12.5 mg daily, follow creatinine carefully with this.  - Continue low dose digoxin, need to check level in am.      2. CAD: h/o CABG. No chest pain. He is on Eliquis so not on ASA. Continue statin.  3. Atrial fibrillation: Paroxysmal, went into  atrial fibrillation again this admit and cardioverted to NSR. Remains on amiodarone drip, convert over to po today.   4. CKD: Creatinine stable today.  BMET again in am.  Restarting po diuretics.  5. Disposition: Probably home tomorrow morning.    [name]  05/23/2014 11:47 AM

## 2014-05-23 NOTE — Progress Notes (Signed)
Progress Note  [name] [id number] DOB: [birth year] DOA: 05/18/2014 PCP: Lottie Dawson, MD  Admit HPI / Brief Narrative: 81 year old male w/ a history of CAD s/p CABG, ischemic cardiomyopathy, paroxysmal atrial fibrillation on Eliquis, CKD stage III, diabetes mellitus type 2, hypertension, dyslipidemia, and OSA on CPAP who presented to the ED with worsening shortness of breath, weight gain, and peripheral edema.  Patient with recent hospitalization 04/27/14-04/30/14 due to a CHF exacerbation, discharged on Lasix 60 MG twice a day.  Due to some confusion had not taken his nightly dose of Lasix for 4 days.  En route to the ED patient was placed on BiPAP for hypoxia with O2 sats of 86% and placed on nitro drip for elevated blood pressure.  In the ED, WBCs 18.7, BNP 990, CXR with early infiltrate.  Heart failure team was consulted, and patient was admitted for CHF exacerbation and possible HCAP. 3/3- underwent DCCV for Afib despite Amio gtt.   HPI/Subjective: Denies any shortness of breath, denies any chest pain. No changes in weight since yesterday, off losartan, Lasix and metolazone.  Assessment/Plan:  Acute Systolic CHF exacerbation -Missed 4 doses of 60mg  lasix at home -2D echo- [id number], grade 2 diastolic dysfunction -Discussed with Dr. [name], patient will be placed on Lasix drip. -Lower extremity edema much better since admission. -Not on oxygen, feels much better, await cardiology recommendation.  ?HCAP  -remains afebrile, no leukocytosis, low procalcitonin, weaned off [place] O2 to RA. -rapidly improving with diuresis- stopped abx 3/2- follow clinically. -Strep/legionella/ influenza  negative  Hypertension -Blood pressure had improved  -per Cards- decreased dosage BP meds Coreg 3.125 and Ramipril 1.25 daily  -Hold Lasix for systolic blood pressure less than 85  Paroxysmal Atrial fibrillation -s/p CRT device. 3/1- went into sustained afib with RVR despite Amiodarone gtt -per  Cards- underwent DCCV 3/3, placed on Amio gtt; continue CVP measuring -Continue home Eliquis  Hypokalemia -resolved  Abdominal distention -Complaining about constipation, we'll start laxative.  CAD/ischemic cardiomyopathy -Status post CABG. -not on ASA (on Eliquis), continue Lipitor  CKD, stage III -creatinine 1.72, baseline about 1.4-1.7  -Creatinine is worsening from 1.8 to 2.1  Diabetes mellitus type II controlled -CBGs controlled -3/2 Hemoglobin A1c = 6.3  -Continue increase Levemir to 20 units. Continue SSI  Dyslipidemia -cont Lipitor  Restless leg syndrome Continue Mirapex daily  Depression Continue Lexapro daily  OSA -continue home CPAP   Code Status: DNR Family Communication: Wife at bedsie Disposition Plan: SDU for amio infusion  Consultants: Cardiology Heart Failure team  Procedure/Significant Events: 3/2 - started on Amio infusion per cards for Afib with RVR 3/3- DCCV for continued Afib despite Amio gtt   Culture None Antibiotics: Vancomycin and Cefepime 3/1 > 3/2  DVT prophylaxis: On Eliquis   Devices Pacemaker   LINES / TUBES:  PICC for CVP    Objective: Blood pressure 126/61, pulse 60, temperature 98.7 F (37.1 C), temperature source Oral, resp. rate 18, height 6\' 2"  (1.88 m), weight 96.072 kg (211 lb 12.8 oz), SpO2 93 %.  Intake/Output Summary (Last 24 hours) at 05/23/14 0957 Last data filed at 05/23/14 [id number]  Gross per 24 hour  Intake 955.09 ml  Output   2150 ml  Net -1194.91 ml   Exam: General:Caucasian  male in NAD Lungs: CTAB, no crackles of wheeze Cardiovascular:RRR, no murmur gallop or rub, + JVD Abdomen: distended, nontender, soft, bowel sounds positive, no rebound, no ascites, no appreciable mass Extremities: No significant cyanosis, clubbing, 1+ edema L > R leg.  Data Reviewed: Basic Metabolic Panel:  Recent Labs Lab 05/20/14 0645 05/21/14 0500 05/22/14 0525 05/22/14 1955 05/23/14 0440  NA 142 141 138  139 135  K 4.1 3.8 3.3* 4.1 3.4*  CL 103 100 91* 92* 86*  CO2 33* 32 37* 39* 38*  GLUCOSE 102* 160* 191* 162* 331*  BUN 40* 38* 41* 45* 42*  CREATININE 1.72* 1.80* 2.10* 2.13* 2.09*  CALCIUM 9.0 9.1 9.4 9.3 8.9  MG 2.1  --   --   --   --    Liver Function Tests:  Recent Labs Lab 05/18/14 1105  AST 31  ALT 36  ALKPHOS 67  BILITOT 0.9  PROT 6.8  ALBUMIN 3.7   CBC:  Recent Labs Lab 05/18/14 1105 05/20/14 0645 05/21/14 0500 05/22/14 0525 05/23/14 0440  WBC 18.7* 8.8 9.0 7.3 7.4  NEUTROABS 16.4*  --   --   --   --   HGB 13.0 11.3* 10.8* 11.7* 10.8*  HCT 41.6 36.2* 34.4* 37.0* 33.6*  MCV 97.2 95.0 94.0 92.7 92.3  PLT 191 156 155 148* 146*   Cardiac Enzymes:  Recent Labs Lab 05/18/14 1545  TROPONINI 0.03   BNP (last 3 results)  Recent Labs  04/27/14 1718 04/29/14 0522 05/18/14 1105  BNP 1434.8* 1174.5* 990.6*    CBG:  Recent Labs Lab 05/22/14 0727 05/22/14 1131 05/22/14 1704 05/22/14 2123 05/23/14 0724  GLUCAP 101* 141* 236* 116* 173*    Recent Results (from the past 240 hour(s))  Blood culture (routine x 2)     Status: None (Preliminary result)   Collection Time: 05/18/14  1:40 PM  Result Value Ref Range Status   Specimen Description BLOOD LEFT HAND  Final   Special Requests BOTTLES DRAWN AEROBIC ONLY 5CC  Final   Culture   Final           BLOOD CULTURE RECEIVED NO GROWTH TO DATE CULTURE WILL BE HELD FOR 5 DAYS BEFORE ISSUING A FINAL NEGATIVE REPORT Performed at [facility]    Report Status PENDING  Incomplete  Blood culture (routine x 2)     Status: None (Preliminary result)   Collection Time: 05/18/14  1:45 PM  Result Value Ref Range Status   Specimen Description BLOOD RIGHT ARM  Final   Special Requests BOTTLES DRAWN AEROBIC AND ANAEROBIC 5CC  Final   Culture   Final           BLOOD CULTURE RECEIVED NO GROWTH TO DATE CULTURE WILL BE HELD FOR 5 DAYS BEFORE ISSUING A FINAL NEGATIVE REPORT Performed at [facility]     Report Status PENDING  Incomplete  MRSA PCR Screening     Status: None   Collection Time: 05/18/14  2:25 PM  Result Value Ref Range Status   MRSA by PCR NEGATIVE NEGATIVE Final    Comment:        The GeneXpert MRSA Assay (FDA approved for NASAL specimens only), is one component of a comprehensive MRSA colonization surveillance program. It is not intended to diagnose MRSA infection nor to guide or monitor treatment for MRSA infections.      Studies:  Recent x-ray studies have been reviewed in detail by the Attending Physician  Scheduled Meds:  Scheduled Meds: . apixaban  2.5 mg Oral BID  . atorvastatin  40 mg Oral Daily  . carvedilol  3.125 mg Oral BID WC  . digoxin  0.0625 mg Oral Daily  . escitalopram  10 mg Oral Daily  . hydrALAZINE  25 mg Oral 3 times per day  . insulin aspart  0-9 Units Subcutaneous TID WC  . insulin detemir  18 Units Subcutaneous QHS  . isosorbide mononitrate  30 mg Oral Daily  . polyethylene glycol  17 g Oral Daily  . potassium chloride  40 mEq Oral Daily  . pramipexole  0.5 mg Oral TID  . sodium chloride  10-40 mL Intracatheter Q12H  . tamsulosin  0.4 mg Oral Daily    Time spent on care of this patient: 35 mins   [facility] Office  [phone] Pager - [phone]  On-Call/Text Page:      [email or web address]      password [email or web address]  If 7PM-7AM, please contact night-coverage [email or web address] Password [email or web address] 05/23/2014, 9:57 AM   LOS: 5 days

## 2014-05-24 DIAGNOSIS — E1140 Type 2 diabetes mellitus with diabetic neuropathy, unspecified: Secondary | ICD-10-CM

## 2014-05-24 LAB — BASIC METABOLIC PANEL
Anion gap: 4 — ABNORMAL LOW (ref 5–15)
Anion gap: 3 — ABNORMAL LOW (ref 5–15)
BUN: 31 mg/dL — ABNORMAL HIGH (ref 6–23)
BUN: 43 mg/dL — ABNORMAL HIGH (ref 6–23)
CALCIUM: 8.9 mg/dL (ref 8.4–10.5)
CO2: 28 mmol/L (ref 19–32)
CO2: 38 mmol/L — ABNORMAL HIGH (ref 19–32)
CREATININE: 1.54 mg/dL — AB (ref 0.50–1.35)
Calcium: 6.5 mg/dL — ABNORMAL LOW (ref 8.4–10.5)
Chloride: 111 mmol/L (ref 96–112)
Chloride: 96 mmol/L (ref 96–112)
Creatinine, Ser: 2.26 mg/dL — ABNORMAL HIGH (ref 0.50–1.35)
GFR calc Af Amer: 46 mL/min — ABNORMAL LOW (ref >90)
GFR calc Af Amer: 29 mL/min — ABNORMAL LOW (ref >90)
GFR calc non Af Amer: 40 mL/min — ABNORMAL LOW (ref >90)
GFR, EST NON AFRICAN AMERICAN: 25 mL/min — AB (ref >90)
GLUCOSE: 162 mg/dL — AB (ref 70–99)
Glucose, Bld: 125 mg/dL — ABNORMAL HIGH (ref 70–99)
Potassium: 2.4 mmol/L — CL (ref 3.5–5.1)
Potassium: 4.2 mmol/L (ref 3.5–5.1)
Sodium: 143 mmol/L (ref 135–145)
Sodium: 137 mmol/L (ref 135–145)

## 2014-05-24 LAB — GLUCOSE, CAPILLARY
Glucose-Capillary: 127 mg/dL — ABNORMAL HIGH (ref 70–99)
Glucose-Capillary: 172 mg/dL — ABNORMAL HIGH (ref 70–99)
Glucose-Capillary: 190 mg/dL — ABNORMAL HIGH (ref 70–99)

## 2014-05-24 LAB — BRAIN NATRIURETIC PEPTIDE: B NATRIURETIC PEPTIDE 5: 603.6 pg/mL — AB (ref 0.0–100.0)

## 2014-05-24 LAB — CULTURE, BLOOD (ROUTINE X 2)
CULTURE: NO GROWTH
CULTURE: NO GROWTH

## 2014-05-24 LAB — DIGOXIN LEVEL: Digoxin Level: <0.2 ng/mL — ABNORMAL LOW (ref 0.8–2.0)

## 2014-05-24 MED ORDER — POTASSIUM CHLORIDE CRYS ER 20 MEQ PO TBCR: 40.0000 meq | EXTENDED_RELEASE_TABLET | Freq: Every day | ORAL | Status: DC

## 2014-05-24 MED ORDER — ISOSORBIDE MONONITRATE ER 30 MG PO TB24
30.0000 mg | ORAL_TABLET | Freq: Every day | ORAL | Status: DC
Start: 2014-05-24 — End: 2014-08-12

## 2014-05-24 MED ORDER — HYDRALAZINE HCL 25 MG PO TABS: 25.0000 mg | ORAL_TABLET | Freq: Three times a day (TID) | ORAL | Status: DC

## 2014-05-24 MED ORDER — SPIRONOLACTONE 25 MG PO TABS: 12.5000 mg | ORAL_TABLET | Freq: Every day | ORAL | Status: DC

## 2014-05-24 MED ORDER — POTASSIUM CHLORIDE CRYS ER 20 MEQ PO TBCR: 40.0000 meq | EXTENDED_RELEASE_TABLET | Freq: Once | ORAL | Status: DC

## 2014-05-24 MED ORDER — CARVEDILOL 3.125 MG PO TABS: 3.1250 mg | ORAL_TABLET | Freq: Two times a day (BID) | ORAL | Status: DC

## 2014-05-24 MED ORDER — TORSEMIDE 20 MG PO TABS: 60.0000 mg | ORAL_TABLET | Freq: Every day | ORAL | Status: DC

## 2014-05-24 MED ORDER — POTASSIUM CHLORIDE CRYS ER 20 MEQ PO TBCR
60.0000 meq | EXTENDED_RELEASE_TABLET | Freq: Four times a day (QID) | ORAL | Status: AC
  Administered 2014-05-24 (×2): 60 meq via ORAL
  Filled 2014-05-24 (×3): qty 3

## 2014-05-24 MED ORDER — AMIODARONE HCL 200 MG PO TABS: ORAL_TABLET | ORAL | Status: DC

## 2014-05-24 MED ORDER — DIGOXIN 62.5 MCG PO TABS: 0.0625 mg | ORAL_TABLET | Freq: Every day | ORAL | Status: DC

## 2014-05-24 NOTE — Discharge Summary (Signed)
Physician Discharge Summary  [name] [id number] DOB: 04/14/1928 DOA: 05/18/2014  PCP: Lottie Dawson, MD  Admit date: 05/18/2014 Discharge date: 05/24/2014  Time spent: 40 minutes  Recommendations for Outpatient Follow-up:  1. Follow-up with CHF clinic on 05/31/2014. 2. Multiple medication changes, please refer to the medication list. 3. Check BMP in 1 week, creatinine on discharge is 2.26.  Discharge Diagnoses:  Principal Problem:   HCAP (healthcare-associated pneumonia) Active Problems:   Hypothyroidism   Obstructive sleep apnea   RESTLESS LEG SYNDROME   Essential hypertension   Cerebrovascular disease   Ischemic cardiomyopathy   Diabetic neuropathy, type II diabetes mellitus   CKD (chronic kidney disease) stage 3, GFR 30-59 ml/min   Hyperlipemia   Paroxysmal atrial fibrillation   Chronic kidney disease   Acute on chronic systolic congestive heart failure   Leucocytosis   Acute on chronic combined systolic and diastolic congestive heart failure   Acute respiratory failure with hypoxia   Hypokalemia   Abdominal distention   Cardiomyopathy, ischemic   Chronic kidney disease, stage III (moderate)   Diabetes type 2, controlled   Depression   CHF exacerbation   Discharge Condition: Stable  Diet recommendation: Heart healthy diet  Filed Weights   05/22/14 0505 05/23/14 0424 05/24/14 0400  Weight: 95.709 kg (211 lb) 96.072 kg (211 lb 12.8 oz) 94.53 kg (208 lb 6.4 oz)    History of present illness:  [name] is a 81 y.o. male, with a past medical history significant for paroxysmal atrial fibrillation, ischemic cardiomyopathy status post CABG, systolic heart failure (EF 20-25% on 04/28/2014), obstructive sleep apnea on C Pap, and diabetes mellitus. Who was recently discharged on 04/30/14 after being treated for congestive heart failure. He went home at a weight of 217.4 pounds he returns today at 225.5 pounds very short of breath. [name] reports he was  doing well until he became "short winded" last night. This morning his wife found him sitting in the chair not moving. He stated he felt very poorly and was having difficulty breathing. He denied any chest pain. At discharge [name] was prescribed 1.5 tablets of Lasix twice a day. Per his wife on Friday or Saturday of this week he decreased his Lasix to once a day.  He was brought to the ER by EMS. In transit he was placed on BiPAP for an O2 sat of 86%, he was also placed on nitro drip. In the ER his white count is 18.7, his BNP is relatively low 990 compared to 2 weeks prior, his creatinine is approximately at baseline. His chest x-ray shows a possible infiltrate. Thus he will be admitted for HCAP with acute on chronic heart failure exacerbation. The heart failure team has been consulted  Hospital Course:    Acute Systolic CHF exacerbation -Missed 4 doses of 60mg  lasix at home and came back with symptoms and signs of acute CHF exacerbation. -2D echo- [id number], grade 2 diastolic dysfunction -Patient was on Lasix drip in the hospital, also was on amiodarone drip for his atrial fibrillation. -Lower extremity edema much better since admission. -Cardiology recommended multiple changes to his CHF regimen, please see medication list.  ?HCAP  -remains afebrile, no leukocytosis, low procalcitonin, weaned off [place] O2 to RA. -rapidly improving with diuresis- stopped abx 3/2- follow clinically. -Strep/legionella/ influenza negative, antibiotics discontinued.  Hypertension -Blood pressure had improved  -CHF medications adjusted with addition of hydralazine, Imdur and discontinuation of lisinopril.  Paroxysmal Atrial fibrillation -s/p CRT device. 3/1- went into  sustained afib with RVR despite Amiodarone gtt -per Cards- underwent DCCV 3/3, placed on Amio gtt. -On discharge amiodarone 200 mg by mouth twice a day for 10 more days, then 200 mg daily. -Continue home  Eliquis  Hypokalemia -resolved  Abdominal distention -Complaining about constipation, laxative was given and patient did have bowel movement prior to discharge.  CAD/ischemic cardiomyopathy -Status post CABG. -not on ASA (on Eliquis), continue Lipitor  CKD, stage III -creatinine 1.72, baseline about 1.4-1.7  -Creatinine on discharge is 2.2  Diabetes mellitus type II controlled -CBGs controlled -3/2 Hemoglobin A1c = 6.3  -Continue increase Levemir to 20 units. Continue SSI  Dyslipidemia -cont Lipitor  Restless leg syndrome Continue Mirapex daily  Depression Continue Lexapro daily  OSA -continue home CPAP   Procedures:  DCCV done on 05/20/2014 done by Dr. Debara Pickett 1. Successful DCCV to a bi-ventricular paced rhythm with a single 150J biphasic shock.   Consultations:  Cardiology  Discharge Exam: Filed Vitals:   05/24/14 1233  BP: 125/61  Pulse: 60  Temp: 98.5 F (36.9 C)  Resp:    General: Alert and awake, oriented x3, not in any acute distress. HEENT: anicteric sclera, pupils reactive to light and accommodation, EOMI CVS: S1-S2 clear, no murmur rubs or gallops Chest: clear to auscultation bilaterally, no wheezing, rales or rhonchi Abdomen: soft nontender, nondistended, normal bowel sounds, no organomegaly Extremities: Trace bilateral pedal edema Neuro: Cranial nerves II-XII intact, no focal neurological deficits  Discharge Instructions   Discharge Instructions    Diet - low sodium heart healthy    Complete by:  As directed      Increase activity slowly    Complete by:  As directed           Current Discharge Medication List    START taking these medications   Details  digoxin 62.5 MCG TABS Take 0.0625 mg by mouth daily. Qty: 30 tablet, Refills: 0    hydrALAZINE (APRESOLINE) 25 MG tablet Take 1 tablet (25 mg total) by mouth every 8 (eight) hours. Qty: 90 tablet, Refills: 0    isosorbide mononitrate (IMDUR) 30 MG 24 hr tablet Take 1 tablet  (30 mg total) by mouth daily. Qty: 30 tablet, Refills: 0    spironolactone (ALDACTONE) 25 MG tablet Take 0.5 tablets (12.5 mg total) by mouth daily. Qty: 30 tablet, Refills: 0    torsemide (DEMADEX) 20 MG tablet Take 3 tablets (60 mg total) by mouth daily. Qty: 90 tablet, Refills: 0      CONTINUE these medications which have CHANGED   Details  amiodarone (PACERONE) 200 MG tablet Take 2 tablets PO daily for 10 days, then 1 tablet daily Qty: 60 tablet, Refills: 3   Associated Diagnoses: Atrial fibrillation, unspecified    carvedilol (COREG) 3.125 MG tablet Take 1 tablet (3.125 mg total) by mouth 2 (two) times daily with a meal. Qty: 60 tablet, Refills: 0   Associated Diagnoses: Coronary artery disease involving coronary bypass graft of native heart without angina pectoris    potassium chloride SA (K-DUR,KLOR-CON) 20 MEQ tablet Take 2 tablets (40 mEq total) by mouth daily. Qty: 60 tablet, Refills: 0      CONTINUE these medications which have NOT CHANGED   Details  acetaminophen (TYLENOL) 500 MG tablet Take 500 mg by mouth daily as needed for mild pain.    apixaban (ELIQUIS) 2.5 MG TABS tablet Take 1 tablet (2.5 mg total) by mouth 2 (two) times daily. Qty: 180 tablet, Refills: 3    atorvastatin (LIPITOR)  40 MG tablet Take 1 tablet (40 mg total) by mouth daily. Qty: 90 tablet, Refills: 3   Associated Diagnoses: Coronary artery disease involving native coronary artery of native heart without angina pectoris    escitalopram (LEXAPRO) 10 MG tablet Take 1 tablet (10 mg total) by mouth daily. Qty: 90 tablet, Refills: 1    glipiZIDE (GLUCOTROL) 10 MG tablet Take 1 tablet (10 mg total) by mouth daily before breakfast. Qty: 180 tablet, Refills: 1    Insulin Detemir (LEVEMIR) 100 UNIT/ML Pen Inject 18 Units into the skin at bedtime. as directed Qty: 2 pen, Refills: 0   Associated Diagnoses: Chronic systolic heart failure; Ischemic cardiomyopathy    pramipexole (MIRAPEX) 0.5 MG tablet  Take 2-3 hours before sleep for restless leg. Qty: 90 tablet, Refills: 2    saxagliptin HCl (ONGLYZA) 5 MG TABS tablet Take 1 tablet (5 mg total) by mouth daily. Qty: 90 tablet, Refills: 1    silodosin (RAPAFLO) 8 MG CAPS capsule Take 1 capsule (8 mg total) by mouth daily with breakfast. Qty: 90 capsule, Refills: 1      STOP taking these medications     furosemide (LASIX) 40 MG tablet      ramipril (ALTACE) 2.5 MG capsule        No Known Allergies Follow-up Information    Follow up with Glori Bickers, MD On 05/31/2014.   Specialty:  Cardiology   Why:  at [place] in the Advanced Heart Failure clinic--gate code [id number]--bring all medications   Contact information:   [place] [id number] [phone]        The results of significant diagnostics from this hospitalization (including imaging, microbiology, ancillary and laboratory) are listed below for reference.    Significant Diagnostic Studies: Dg Chest 2 View  04/27/2014   CLINICAL DATA:  Shortness of breath for 2 weeks  EXAM: CHEST  2 VIEW  COMPARISON:  03/25/2014  FINDINGS: Stable cardiomegaly and aortic tortuosity post CABG. Stable positioning of biventricular ICD/ pacer leads from the left. There is new interstitial opacity with [employer]. No pleural effusion or pneumonia. No pneumothorax.  IMPRESSION: Mild CHF.   Electronically Signed   By: Monte Fantasia M.D.   On: 04/27/2014 17:30   US Abdomen Limited  05/18/2014   CLINICAL DATA:  Abdominal distention.  Ascites.  EXAM: LIMITED ABDOMEN ULTRASOUND FOR ASCITES  TECHNIQUE: Limited ultrasound survey for ascites was performed in all four abdominal quadrants.  COMPARISON:  03/01/2014 renal ultrasound.  FINDINGS: No ascites is visible.  IMPRESSION: Negative for ascites.   Electronically Signed   By: Dereck Ligas M.D.   On: 05/18/2014 20:37   Dg Chest Port 1 View  05/19/2014   CLINICAL DATA:  RIGHT side PICC line placement, history hypertension,  CHF, diabetes, ischemic cardiomyopathy, MI, atrial fibrillation  EXAM: PORTABLE CHEST - 1 VIEW  COMPARISON:  Portable exam 1819 hours compared to 05/18/2014  FINDINGS: RIGHT arm PICC line tip projects over proximal SVC.  LEFT subclavian transvenous pacemaker/AICD leads project over RIGHT atrium, RIGHT ventricle and coronary sinus.  Enlargement of cardiac silhouette post CABG.  Slight pulmonary vascular congestion.  Calcified tortuous aorta.  Minimal RIGHT basilar atelectasis.  Lungs otherwise clear.  No pleural effusion or pneumothorax.  IMPRESSION: Enlargement of cardiac silhouette post CABG and AICD.  Tip of RIGHT arm PICC line projects over proximal SVC.   Electronically Signed   By: Lavonia Dana M.D.   On: 05/19/2014 20:11   Dg Chest  Port 1 View  05/18/2014   CLINICAL DATA:  Shortness of breath, history of atrial fibrillation  EXAM: PORTABLE CHEST - 1 VIEW  COMPARISON:  04/27/2014  FINDINGS: Cardiomegaly again noted. Status post CABG. Dual lead cardiac pacemaker is unchanged in position. Central mild vascular congestion without convincing pulmonary edema. There is streaky right basilar atelectasis or infiltrate.  IMPRESSION: Cardiomegaly. Dual lead cardiac pacemaker in place. Status post CABG. Central mild vascular congestion without convincing pulmonary edema. Streaky right basilar atelectasis or early infiltrate.   Electronically Signed   By: Lahoma Crocker M.D.   On: 05/18/2014 11:39    Microbiology: Recent Results (from the past 240 hour(s))  Blood culture (routine x 2)     Status: None   Collection Time: 05/18/14  1:40 PM  Result Value Ref Range Status   Specimen Description BLOOD LEFT HAND  Final   Special Requests BOTTLES DRAWN AEROBIC ONLY 5CC  Final   Culture   Final    NO GROWTH 5 DAYS Performed at [facility]    Report Status 05/24/2014 FINAL  Final  Blood culture (routine x 2)     Status: None   Collection Time: 05/18/14  1:45 PM  Result Value Ref Range Status   Specimen  Description BLOOD RIGHT ARM  Final   Special Requests BOTTLES DRAWN AEROBIC AND ANAEROBIC 5CC  Final   Culture   Final    NO GROWTH 5 DAYS Performed at [facility]    Report Status 05/24/2014 FINAL  Final  MRSA PCR Screening     Status: None   Collection Time: 05/18/14  2:25 PM  Result Value Ref Range Status   MRSA by PCR NEGATIVE NEGATIVE Final    Comment:        The GeneXpert MRSA Assay (FDA approved for NASAL specimens only), is one component of a comprehensive MRSA colonization surveillance program. It is not intended to diagnose MRSA infection nor to guide or monitor treatment for MRSA infections.      Labs: Basic Metabolic Panel:  Recent Labs Lab 05/20/14 0645  05/22/14 0525 05/22/14 1955 05/23/14 0440 05/24/14 0500 05/24/14 1215  NA 142  < > 138 139 135 143 137  K 4.1  < > 3.3* 4.1 3.4* 2.4* 4.2  CL 103  < > 91* 92* 86* 111 96  CO2 33*  < > 37* 39* 38* 28 38*  GLUCOSE 102*  < > 191* 162* 331* 125* 162*  BUN 40*  < > 41* 45* 42* 31* 43*  CREATININE 1.72*  < > 2.10* 2.13* 2.09* 1.54* 2.26*  CALCIUM 9.0  < > 9.4 9.3 8.9 6.5* 8.9  MG 2.1  --   --   --   --   --   --   < > = values in this interval not displayed. Liver Function Tests:  Recent Labs Lab 05/18/14 1105  AST 31  ALT 36  ALKPHOS 67  BILITOT 0.9  PROT 6.8  ALBUMIN 3.7   No results for input(s): LIPASE, AMYLASE in the last 168 hours. No results for input(s): AMMONIA in the last 168 hours. CBC:  Recent Labs Lab 05/18/14 1105 05/20/14 0645 05/21/14 0500 05/22/14 0525 05/23/14 0440  WBC 18.7* 8.8 9.0 7.3 7.4  NEUTROABS 16.4*  --   --   --   --   HGB 13.0 11.3* 10.8* 11.7* 10.8*  HCT 41.6 36.2* 34.4* 37.0* 33.6*  MCV 97.2 95.0 94.0 92.7 92.3  PLT 191 156 155  148* 146*   Cardiac Enzymes:  Recent Labs Lab 05/18/14 1545  TROPONINI 0.03   BNP: BNP (last 3 results)  Recent Labs  04/29/14 0522 05/18/14 1105 05/24/14 0500  BNP 1174.5* 990.6* 603.6*    ProBNP (last 3  results) No results for input(s): PROBNP in the last 8760 hours.  CBG:  Recent Labs Lab 05/23/14 1112 05/23/14 1653 05/23/14 2134 05/24/14 0736 05/24/14 1159  GLUCAP 177* 175* 184* 127* 172*       Signed:  Hudsen Fei A  [facility] 05/24/2014, 1:19 PM

## 2014-05-24 NOTE — Evaluation (Addendum)
Occupational Therapy Evaluation Patient Details Name: [name] MRN: [id number] DOB: 09/26/1928 Today's Date: 05/24/2014    History of Present Illness 81 year old male w/ a history of CAD s/p CABG, ischemic cardiomyopathy, paroxysmal atrial fibrillation on Eliquis, CKD stage III, diabetes mellitus type 2, hypertension, dyslipidemia, and OSA on CPAP who presented to the ED with worsening shortness of breath, weight gain, and peripheral edema.   Clinical Impression   Pt admitted with above. Education provided in session and feel pt is safe to d/c home, from OT standpoint, with wife available to assist.     Follow Up Recommendations  No OT follow up;Supervision/Assistance - 24 hour    Equipment Recommendations  None recommended by OT    Recommendations for Other Services       Precautions / Restrictions Precautions Precautions: Fall Restrictions Weight Bearing Restrictions: No      Mobility Bed Mobility Overal bed mobility: Needs assistance  Bed Mobility: Supine to Sit;Sit to Supine     Supine to sit: Supervision Sit to supine: Supervision   General bed mobility comments: supervision for lines  Transfers Overall transfer level: Needs assistance Equipment used: None Transfers: Sit to/from Stand Sit to Stand: Supervision            Balance Overall balance assessment: Per PT eval, history of Falls. Min guard for ambulation in session (pt pushing IV pole and also ambulated without IV pole)              ADL Overall ADL's : Needs assistance/impaired                     Lower Body Dressing: Set up;Supervision/safety;Sit to/from stand   Toilet Transfer: Min guard;Ambulation (bed; pushed IV pole and also walked without it)       Tub/ Shower Transfer: Min guard;Ambulation;Tub transfer   Functional mobility during ADLs: Min guard General ADL Comments: Educated on safety such as sitting for most of LB ADLs, safe shoewear, rugs/items on floor, and  recommended spouse be with him for tub transfer. Recommended pt use his shower chair for LB bathing.  Suggested spouse using a belt around pt's waist if she would like- to assist with balance if needed.     Vision  Pt wears glasses. Reports no change from baseline with vision.   Perception     Praxis      Pertinent Vitals/Pain Pain Assessment: 0-10 Pain Score: 4  Pain Location: Right hip Pain Intervention(s): Monitored during session     Hand Dominance     Extremity/Trunk Assessment Upper Extremity Assessment Upper Extremity Assessment: Overall WFL for tasks assessed   Lower Extremity Assessment Lower Extremity Assessment: Defer to PT evaluation   Cervical / Trunk Assessment Cervical / Trunk Assessment: Normal   Communication Communication Communication: HOH (hearing aid)   Cognition Arousal/Alertness: Awake/alert Behavior During Therapy: WFL for tasks assessed/performed Overall Cognitive Status: Within Functional Limits for tasks assessed                     General Comments       Exercises       Shoulder Instructions      Home Living Family/patient expects to be discharged to:: Private residence Living Arrangements: Spouse/significant other Available Help at Discharge: Family;Available 24 hours/day Type of Home: House Home Access: Stairs to enter [employer] of Steps: 3 Entrance Stairs-Rails: Right;Left Home Layout: One level     Bathroom Shower/Tub: Teacher, early years/pre: Handicapped  height     Home Equipment: Cane - single point;Shower seat;Bedside commode; grab bar in tub/shower           Prior Functioning/Environment Level of Independence: Independent with assistive device(s)        Comments: Used cane at times per pt.      OT Diagnosis: Acute pain   OT Problem List:     OT Treatment/Interventions:      OT Goals(Current goals can be found in the care plan section) Acute Rehab OT Goals Patient Stated  Goal: go home  OT Frequency:     Barriers to D/C:            Co-evaluation              End of Session Equipment Utilized During Treatment: Gait belt Nurse Communication: Mobility status;Other (comment) (d/c recommendation)  Activity Tolerance: Patient tolerated treatment well Patient left: in bed;with call bell/phone within reach;with family/visitor present   Time: [id number] OT Time Calculation (min): 13 min Charges:  OT General Charges $OT Visit: 1 Procedure OT Evaluation $Initial OT Evaluation Tier I: 1 Procedure G-CodesBenito Mccreedy OTR/L [id number] 05/24/2014, 11:36 AM

## 2014-05-24 NOTE — Evaluation (Signed)
Physical Therapy Evaluation Patient Details Name: [name] MRN: [id number] DOB: 01-Jun-1928 Today's Date: 05/24/2014   History of Present Illness  81 year old male w/ a history of CAD s/p CABG, ischemic cardiomyopathy, paroxysmal atrial fibrillation on Eliquis, CKD stage III, diabetes mellitus type 2, hypertension, dyslipidemia, and OSA on CPAP who presented to the ED with worsening shortness of breath, weight gain, and peripheral edema.  Clinical Impression  Pt admitted with above diagnosis. Pt currently with functional limitations due to the deficits listed below (see PT Problem List). Pt ambulated well with some staggering however was able to self correct without device therefore if uses cane and has 24 hour assist at home, should be ok to go home with HHPT f/u.   Pt will benefit from skilled PT to increase their independence and safety with mobility to allow discharge to the venue listed below.      Follow Up Recommendations Home health PT    Equipment Recommendations  None recommended by PT    Recommendations for Other Services       Precautions / Restrictions Precautions Precautions: Fall Restrictions Weight Bearing Restrictions: No      Mobility  Bed Mobility Overal bed mobility: Needs Assistance Bed Mobility: Supine to Sit;Sit to Supine     Supine to sit: Supervision Sit to supine: Supervision   General bed mobility comments: supervision for lines  Transfers Overall transfer level: Needs assistance Equipment used: None Transfers: Sit to/from Stand Sit to Stand: Supervision         General transfer comment: safe technique  Ambulation/Gait Ambulation/Gait assistance: Min guard Ambulation Distance (Feet): 450 Feet Assistive device: None Gait Pattern/deviations: Step-through pattern;Decreased stride length;Staggering left;Staggering right Gait velocity: slower Gait velocity interpretation: Below normal speed for age/gender General Gait Details:  generally steady with progressively more dyspnea the longer he ambulates.  At times, staggered but able to self correct.  Pt ambulates with toes out.  Pt and wife say gait is a little unsteady but they do not want to use a RW. Wife will guard pt and they prefer cane.  will get pt HHPT f/u.   Stairs            Wheelchair Mobility    Modified Rankin (Stroke Patients Only)       Balance Overall balance assessment: Needs assistance;History of Falls         Standing balance support: No upper extremity supported;During functional activity Standing balance-Leahy Scale: Fair Standing balance comment: can stand statically without UE support.                   Standardized Balance Assessment Standardized Balance Assessment : Dynamic Gait Index   Dynamic Gait Index Level Surface: Normal Change in Gait Speed: Normal Gait with Horizontal Head Turns: Mild Impairment Gait with Vertical Head Turns: Mild Impairment Gait and Pivot Turn: Mild Impairment Step Over Obstacle: Normal Step Around Obstacles: Normal Steps: Moderate Impairment Total Score: 19       Pertinent Vitals/Pain Pain Assessment: 0-10 Pain Score: 4  Pain Location: Right hip Pain Intervention(s): Monitored during session  VSS with sats 94% on RA.      Home Living Family/patient expects to be discharged to:: Private residence Living Arrangements: Spouse/significant other Available Help at Discharge: Family;Available 24 hours/day Type of Home: House Home Access: Stairs to enter Entrance Stairs-Rails: Psychiatric nurse of Steps: 3 Home Layout: One level Home Equipment: Cane - single point;Shower seat;Bedside commode;Grab bars - tub/shower      Prior  Function Level of Independence: Independent with assistive device(s)         Comments: Used cane at times per pt.       Hand Dominance        Extremity/Trunk Assessment   Upper Extremity Assessment: Overall WFL for tasks  assessed           Lower Extremity Assessment: Defer to PT evaluation      Cervical / Trunk Assessment: Normal  Communication   Communication: HOH (hearing aid)  Cognition Arousal/Alertness: Awake/alert Behavior During Therapy: WFL for tasks assessed/performed Overall Cognitive Status: Within Functional Limits for tasks assessed                      General Comments General comments (skin integrity, edema, etc.): Scored 19/24 therefore at low risk of falls and realizes a device is needed.  Kasandra Knudsen should be sufficient with wifes assist.  O2 sat 94% on RA.      Exercises General Exercises - Lower Extremity Ankle Circles/Pumps: AROM;Both;10 reps;Seated Long Arc Quad: AROM;Both;10 reps;Seated      Assessment/Plan    PT Assessment Patient needs continued PT services  PT Diagnosis Generalized weakness   PT Problem List Decreased activity tolerance;Decreased balance;Decreased mobility;Decreased knowledge of use of DME;Decreased safety awareness;Decreased knowledge of precautions  PT Treatment Interventions Gait training;Stair training;DME instruction;Therapeutic activities;Therapeutic exercise;Functional mobility training;Patient/family education   PT Goals (Current goals can be found in the Care Plan section) Acute Rehab PT Goals Patient Stated Goal: go home PT Goal Formulation: With patient Time For Goal Achievement: 05/31/14 Potential to Achieve Goals: Good    Frequency Min 3X/week   Barriers to discharge        Co-evaluation               End of Session Equipment Utilized During Treatment: Gait belt Activity Tolerance: Patient tolerated treatment well Patient left: in chair;with call bell/phone within reach;with family/visitor present Nurse Communication: Mobility status         Time: [id number] PT Time Calculation (min) (ACUTE ONLY): 19 min   Charges:   PT Evaluation $Initial PT Evaluation Tier I: 1 Procedure     PT G CodesDenice Paradise May 26, 2014, 11:43 AM  [facility] [phone] (pager)

## 2014-05-24 NOTE — Progress Notes (Addendum)
Patient ID: [name], male   DOB: 04-29-1928, 81 y.o.   MRN: [id number]   SUBJECTIVE:   DCCV on 3/3, back in A-V sequentially paced rhythm.  CVP 6-7, walked yesterday without dyspnea.     Scheduled Meds: . amiodarone  200 mg Oral BID  . apixaban  2.5 mg Oral BID  . atorvastatin  40 mg Oral Daily  . carvedilol  3.125 mg Oral BID WC  . digoxin  0.0625 mg Oral Daily  . escitalopram  10 mg Oral Daily  . hydrALAZINE  25 mg Oral 3 times per day  . insulin aspart  0-9 Units Subcutaneous TID WC  . insulin detemir  20 Units Subcutaneous QHS  . isosorbide mononitrate  30 mg Oral Daily  . polyethylene glycol  17 g Oral Daily  . potassium chloride  40 mEq Oral Daily  . pramipexole  0.5 mg Oral TID  . sodium chloride  10-40 mL Intracatheter Q12H  . spironolactone  12.5 mg Oral Daily  . tamsulosin  0.4 mg Oral Daily  . torsemide  60 mg Oral Daily   Continuous Infusions:   PRN Meds:.acetaminophen, ondansetron (ZOFRAN) IV, sodium chloride    Filed Vitals:   05/23/14 1546 05/23/14 2001 05/24/14 0024 05/24/14 0400  BP: 117/71 113/45 99/34 122/55  Pulse: 60 60 60 60  Temp: 97.9 F (36.6 C) 97.6 F (36.4 C) 97.5 F (36.4 C) 97.7 F (36.5 C)  TempSrc: Oral Oral Oral Oral  Resp: 20 20 18 14   Height:      Weight:    208 lb 6.4 oz (94.53 kg)  SpO2: 95% 96% 94% 96%    Intake/Output Summary (Last 24 hours) at 05/24/14 [id number] Last data filed at 05/24/14 0600  Gross per 24 hour  Intake    690 ml  Output   2450 ml  Net  -1760 ml    LABS: Basic Metabolic Panel:  Recent Labs  05/22/14 1955 05/23/14 0440  NA 139 135  K 4.1 3.4*  CL 92* 86*  CO2 39* 38*  GLUCOSE 162* 331*  BUN 45* 42*  CREATININE 2.13* 2.09*  CALCIUM 9.3 8.9   Liver Function Tests: No results for input(s): AST, ALT, ALKPHOS, BILITOT, PROT, ALBUMIN in the last 72 hours. No results for input(s): LIPASE, AMYLASE in the last 72 hours. CBC:  Recent Labs  05/22/14 0525 05/23/14 0440  WBC 7.3 7.4  HGB  11.7* 10.8*  HCT 37.0* 33.6*  MCV 92.7 92.3  PLT 148* 146*   Cardiac Enzymes: No results for input(s): CKTOTAL, CKMB, CKMBINDEX, TROPONINI in the last 72 hours. BNP: Invalid input(s): POCBNP D-Dimer: No results for input(s): DDIMER in the last 72 hours. Hemoglobin A1C: No results for input(s): HGBA1C in the last 72 hours. Fasting Lipid Panel: No results for input(s): CHOL, HDL, LDLCALC, TRIG, CHOLHDL, LDLDIRECT in the last 72 hours. Thyroid Function Tests: No results for input(s): TSH, T4TOTAL, T3FREE, THYROIDAB in the last 72 hours.  Invalid input(s): FREET3 Anemia Panel: No results for input(s): VITAMINB12, FOLATE, FERRITIN, TIBC, IRON, RETICCTPCT in the last 72 hours.  RADIOLOGY: Dg Chest 2 View  04/27/2014   CLINICAL DATA:  Shortness of breath for 2 weeks  EXAM: CHEST  2 VIEW  COMPARISON:  03/25/2014  FINDINGS: Stable cardiomegaly and aortic tortuosity post CABG. Stable positioning of biventricular ICD/ pacer leads from the left. There is new interstitial opacity with [employer]. No pleural effusion or pneumonia. No pneumothorax.  IMPRESSION: Mild CHF.   Electronically Signed  By: Monte Fantasia M.D.   On: 04/27/2014 17:30   US Abdomen Limited  05/18/2014   CLINICAL DATA:  Abdominal distention.  Ascites.  EXAM: LIMITED ABDOMEN ULTRASOUND FOR ASCITES  TECHNIQUE: Limited ultrasound survey for ascites was performed in all four abdominal quadrants.  COMPARISON:  03/01/2014 renal ultrasound.  FINDINGS: No ascites is visible.  IMPRESSION: Negative for ascites.   Electronically Signed   By: Dereck Ligas M.D.   On: 05/18/2014 20:37   Dg Chest Port 1 View  05/19/2014   CLINICAL DATA:  RIGHT side PICC line placement, history hypertension, CHF, diabetes, ischemic cardiomyopathy, MI, atrial fibrillation  EXAM: PORTABLE CHEST - 1 VIEW  COMPARISON:  Portable exam 1819 hours compared to 05/18/2014  FINDINGS: RIGHT arm PICC line tip projects over proximal SVC.  LEFT subclavian transvenous  pacemaker/AICD leads project over RIGHT atrium, RIGHT ventricle and coronary sinus.  Enlargement of cardiac silhouette post CABG.  Slight pulmonary vascular congestion.  Calcified tortuous aorta.  Minimal RIGHT basilar atelectasis.  Lungs otherwise clear.  No pleural effusion or pneumothorax.  IMPRESSION: Enlargement of cardiac silhouette post CABG and AICD.  Tip of RIGHT arm PICC line projects over proximal SVC.   Electronically Signed   By: Lavonia Dana M.D.   On: 05/19/2014 20:11   Dg Chest Port 1 View  05/18/2014   CLINICAL DATA:  Shortness of breath, history of atrial fibrillation  EXAM: PORTABLE CHEST - 1 VIEW  COMPARISON:  04/27/2014  FINDINGS: Cardiomegaly again noted. Status post CABG. Dual lead cardiac pacemaker is unchanged in position. Central mild vascular congestion without convincing pulmonary edema. There is streaky right basilar atelectasis or infiltrate.  IMPRESSION: Cardiomegaly. Dual lead cardiac pacemaker in place. Status post CABG. Central mild vascular congestion without convincing pulmonary edema. Streaky right basilar atelectasis or early infiltrate.   Electronically Signed   By: Lahoma Crocker M.D.   On: 05/18/2014 11:39    PHYSICAL EXAM General: NAD Neck: JVP 7. No thyromegaly or thyroid nodule.  Lungs: Slight crackles at bases.   CV: Nondisplaced PMI.  Heart regular S1/S2, no S3/S4, no murmur.  1+ edema at ankles bilaterally.   Abdomen: Soft, nontender, no hepatosplenomegaly, no distention.  Neurologic: Alert and oriented x 3.  Psych: Normal affect. Extremities: No clubbing or cyanosis.  TELEMETRY: Reviewed telemetry pt a-paced, BiV-paced  ASSESSMENT AND PLAN: 1. Acute on chronic systolic CHF: Ischemic cardiomyopathy. EF 20-25% on 2/16 echo. He had missed his pm Lasix x 4 days and wife shows record of his weight steadily increasing. He diuresed some with IV Lasix boluses but he remained volume overloaded on exam. Abdominal US showed no ascites. He has a [employer] CRT-D  device.  He was started on Lasix gtt + metolazone and weight down considerably.  CVP 6-7 today. - Continue current torsemide. - Ramipril held with elevated creatinine, continue hydralazine/Imdur, Coreg, and spironolactone.  - Continue low dose digoxin, need to check level in am, level pending this morning.      2. CAD: h/o CABG. No chest pain. He is on Eliquis so not on ASA. Continue statin.  3. Atrial fibrillation: Paroxysmal, went into atrial fibrillation again this admit and cardioverted to NSR. Now on po amiodarone. 4. CKD: Awaiting today's BMET. 5. Disposition: I think he can go home today.  Needs followup CHF clinic in 1 week.  Home cardiac meds: Coreg 3.125 mg bid, amiodarone 200 mg bid x 10 days then 200 mg daily, apixaban 2.5 mg bid, atorvastatin 40 daily, hydralazine  25 tid, Imdur 30 daily, spironolactone 12.5 daily, torsemide 60 mg daily, KCl 40 daily, digoxin 0.0625 daily.  Stop ramipril and Lasix.   [name]  05/24/2014 7:02 AM

## 2014-05-25 ENCOUNTER — Encounter: Payer: Medicare Other | Admitting: Internal Medicine

## 2014-05-25 DIAGNOSIS — I5043 Acute on chronic combined systolic (congestive) and diastolic (congestive) heart failure: Secondary | ICD-10-CM | POA: Diagnosis not present

## 2014-05-25 DIAGNOSIS — I129 Hypertensive chronic kidney disease with stage 1 through stage 4 chronic kidney disease, or unspecified chronic kidney disease: Secondary | ICD-10-CM | POA: Diagnosis not present

## 2014-05-25 DIAGNOSIS — E1140 Type 2 diabetes mellitus with diabetic neuropathy, unspecified: Secondary | ICD-10-CM | POA: Diagnosis not present

## 2014-05-25 DIAGNOSIS — I2510 Atherosclerotic heart disease of native coronary artery without angina pectoris: Secondary | ICD-10-CM | POA: Diagnosis not present

## 2014-05-25 DIAGNOSIS — I480 Paroxysmal atrial fibrillation: Secondary | ICD-10-CM | POA: Diagnosis not present

## 2014-05-25 DIAGNOSIS — N183 Chronic kidney disease, stage 3 (moderate): Secondary | ICD-10-CM | POA: Diagnosis not present

## 2014-05-26 ENCOUNTER — Telehealth: Payer: Self-pay | Admitting: Internal Medicine

## 2014-05-26 ENCOUNTER — Ambulatory Visit (INDEPENDENT_AMBULATORY_CARE_PROVIDER_SITE_OTHER): Payer: Medicare Other | Admitting: Podiatrist

## 2014-05-26 DIAGNOSIS — E1140 Type 2 diabetes mellitus with diabetic neuropathy, unspecified: Secondary | ICD-10-CM

## 2014-05-26 DIAGNOSIS — N183 Chronic kidney disease, stage 3 (moderate): Secondary | ICD-10-CM | POA: Diagnosis not present

## 2014-05-26 DIAGNOSIS — I129 Hypertensive chronic kidney disease with stage 1 through stage 4 chronic kidney disease, or unspecified chronic kidney disease: Secondary | ICD-10-CM | POA: Diagnosis not present

## 2014-05-26 DIAGNOSIS — I480 Paroxysmal atrial fibrillation: Secondary | ICD-10-CM | POA: Diagnosis not present

## 2014-05-26 DIAGNOSIS — B351 Tinea unguium: Secondary | ICD-10-CM

## 2014-05-26 DIAGNOSIS — M79676 Pain in unspecified toe(s): Secondary | ICD-10-CM

## 2014-05-26 DIAGNOSIS — I2510 Atherosclerotic heart disease of native coronary artery without angina pectoris: Secondary | ICD-10-CM | POA: Diagnosis not present

## 2014-05-26 DIAGNOSIS — Q828 Other specified congenital malformations of skin: Secondary | ICD-10-CM

## 2014-05-26 DIAGNOSIS — M216X9 Other acquired deformities of unspecified foot: Secondary | ICD-10-CM

## 2014-05-26 DIAGNOSIS — I5043 Acute on chronic combined systolic (congestive) and diastolic (congestive) heart failure: Secondary | ICD-10-CM | POA: Diagnosis not present

## 2014-05-26 NOTE — Progress Notes (Signed)
   Subjective:    Patient ID: [name], male    DOB: [birth year], 81 y.o.   MRN: [id number]  Chief Complaint  Patient presents with  . Nail Problem    ''TOENAILS TRIM.''         Objective:   Physical Exam Lower extremity objective findings unchanged neurovascular status is diminished with pedal pulses palpable DP and PT 1/4 bilateral capillary refill timed 3-4 seconds all digits.  neurological epicritic sensation decreased on Semmes Weinstein to forefoot digits and arch bilateral.  Porokeratotic/hyperkeratotic lesion present sub-5 bilateral and sub-second left. Nails thick brittle crumbly friable dystrophic with discoloration tenderness on palpation with shoe wear and ambulation. No open wounds no ulcers no interdigital maceration present. There is notable digital contractures and hammertoe deformity as well as HAV deformity noted.     Assessment & Plan:  Assessment diabetes with history peripheral neuropathy and angiopathy. Multiple keratoses  Plan:  Debridement of nails and keratotic lesions accomplished today without complication.  He will be seen back for routine care. in 3 months

## 2014-05-26 NOTE — Patient Instructions (Signed)
Diabetes and Foot Care Diabetes may cause you to have problems because of poor blood supply (circulation) to your feet and legs. This may cause the skin on your feet to become thinner, break easier, and heal more slowly. Your skin may become dry, and the skin may peel and crack. You may also have nerve damage in your legs and feet causing decreased feeling in them. You may not notice minor injuries to your feet that could lead to infections or more serious problems. Taking care of your feet is one of the most important things you can do for yourself.  HOME CARE INSTRUCTIONS  Wear shoes at all times, even in the house. Do not go barefoot. Bare feet are easily injured.  Check your feet daily for blisters, cuts, and redness. If you cannot see the bottom of your feet, use a mirror or ask someone for help.  Wash your feet with warm water (do not use hot water) and mild soap. Then pat your feet and the areas between your toes until they are completely dry. Do not soak your feet as this can dry your skin.  Apply a moisturizing lotion or petroleum jelly (that does not contain alcohol and is unscented) to the skin on your feet and to dry, brittle toenails. Do not apply lotion between your toes.  Trim your toenails straight across. Do not dig under them or around the cuticle. File the edges of your nails with an emery board or nail file.  Do not cut corns or calluses or try to remove them with medicine.  Wear clean socks or stockings every day. Make sure they are not too tight. Do not wear knee-high stockings since they may decrease blood flow to your legs.  Wear shoes that fit properly and have enough cushioning. To break in new shoes, wear them for just a few hours a day. This prevents you from injuring your feet. Always look in your shoes before you put them on to be sure there are no objects inside.  Do not cross your legs. This may decrease the blood flow to your feet.  If you find a minor scrape,  cut, or break in the skin on your feet, keep it and the skin around it clean and dry. These areas may be cleansed with mild soap and water. Do not cleanse the area with peroxide, alcohol, or iodine.  When you remove an adhesive bandage, be sure not to damage the skin around it.  If you have a wound, look at it several times a day to make sure it is healing.  Do not use heating pads or hot water bottles. They may burn your skin. If you have lost feeling in your feet or legs, you may not know it is happening until it is too late.  Make sure your health care provider performs a complete foot exam at least annually or more often if you have foot problems. Report any cuts, sores, or bruises to your health care provider immediately. SEEK MEDICAL CARE IF:   You have an injury that is not healing.  You have cuts or breaks in the skin.  You have an ingrown nail.  You notice redness on your legs or feet.  You feel burning or tingling in your legs or feet.  You have pain or cramps in your legs and feet.  Your legs or feet are numb.  Your feet always feel cold. SEEK IMMEDIATE MEDICAL CARE IF:   There is increasing redness,   swelling, or pain in or around a wound.  There is a red line that goes up your leg.  Pus is coming from a wound.  You develop a fever or as directed by your health care provider.  You notice a bad smell coming from an ulcer or wound. Document Released: 03/02/2000 Document Revised: 11/05/2012 Document Reviewed: 08/12/2012 ExitCare Patient Information 2015 [employer]. This information is not intended to replace advice given to you by your health care provider. Make sure you discuss any questions you have with your health care provider.  

## 2014-05-26 NOTE — Telephone Encounter (Signed)
[name]  From [facility] resumed pt for home health and [name] would like to  have an additional visit this wk. [name] would like to go twice a wk for  Next 5 wks. Pt needs PT and OT evaluations. Please call with verbal order

## 2014-05-26 NOTE — Telephone Encounter (Signed)
Patient  Is in chf clinic and has fu for his acute chf  There post hospital .   Have cardiology gi ve orders and direct home heath orders n regard to activiy heart failure etc . (We can direct any diabetic care if needed. )

## 2014-05-27 ENCOUNTER — Encounter: Payer: Self-pay | Admitting: Internal Medicine

## 2014-05-27 ENCOUNTER — Telehealth: Payer: Self-pay | Admitting: Cardiology

## 2014-05-27 DIAGNOSIS — I5043 Acute on chronic combined systolic (congestive) and diastolic (congestive) heart failure: Secondary | ICD-10-CM | POA: Diagnosis not present

## 2014-05-27 DIAGNOSIS — I480 Paroxysmal atrial fibrillation: Secondary | ICD-10-CM | POA: Diagnosis not present

## 2014-05-27 DIAGNOSIS — N183 Chronic kidney disease, stage 3 (moderate): Secondary | ICD-10-CM | POA: Diagnosis not present

## 2014-05-27 DIAGNOSIS — E1140 Type 2 diabetes mellitus with diabetic neuropathy, unspecified: Secondary | ICD-10-CM | POA: Diagnosis not present

## 2014-05-27 DIAGNOSIS — I129 Hypertensive chronic kidney disease with stage 1 through stage 4 chronic kidney disease, or unspecified chronic kidney disease: Secondary | ICD-10-CM | POA: Diagnosis not present

## 2014-05-27 DIAGNOSIS — I2510 Atherosclerotic heart disease of native coronary artery without angina pectoris: Secondary | ICD-10-CM | POA: Diagnosis not present

## 2014-05-27 NOTE — Telephone Encounter (Signed)
[name] from [facility] is calling in reference to Mr. Mcdole , because she resume he [place] on Tuesday and is calling to approval for his medication changes (heart) and she wants to see him Twice a week , any activity limitations or vital signs ranges  she will need to know that.  Please call    Thanks

## 2014-05-27 NOTE — Telephone Encounter (Signed)
Called and spoke with [name] and she is aware.  Per [name] she has reached out to the CHF clinic and added the notes to the orders as well.

## 2014-05-28 ENCOUNTER — Telehealth: Payer: Self-pay | Admitting: Internal Medicine

## 2014-05-28 NOTE — Telephone Encounter (Signed)
Daniel from [facility] call to req verbal orders home health pt for 1 week 1 time and 2 times a week for 5 weeks    Phone number [phone]

## 2014-05-28 NOTE — Telephone Encounter (Signed)
Note sent to Dr. Stanford Breed concerning this issue

## 2014-05-28 NOTE — Telephone Encounter (Signed)
See message  Please have cardiology give orders as he is in the high risk CHF clinic   If ok with then then go ahead .

## 2014-05-31 ENCOUNTER — Ambulatory Visit (HOSPITAL_COMMUNITY)
Admission: RE | Admit: 2014-05-31 | Discharge: 2014-05-31 | Disposition: A | Discharge status: Home / Self Care | Payer: Medicare Other | Source: Ambulatory Visit | Attending: Cardiology | Admitting: Cardiology

## 2014-05-31 VITALS — BP 118/48 | HR 75 | Wt 215.8 lb

## 2014-05-31 DIAGNOSIS — E119 Type 2 diabetes mellitus without complications: Secondary | ICD-10-CM | POA: Diagnosis not present

## 2014-05-31 DIAGNOSIS — I255 Ischemic cardiomyopathy: Secondary | ICD-10-CM | POA: Insufficient documentation

## 2014-05-31 DIAGNOSIS — E785 Hyperlipidemia, unspecified: Secondary | ICD-10-CM | POA: Diagnosis not present

## 2014-05-31 DIAGNOSIS — Z951 Presence of aortocoronary bypass graft: Secondary | ICD-10-CM | POA: Diagnosis not present

## 2014-05-31 DIAGNOSIS — N183 Chronic kidney disease, stage 3 unspecified: Secondary | ICD-10-CM

## 2014-05-31 DIAGNOSIS — N189 Chronic kidney disease, unspecified: Secondary | ICD-10-CM | POA: Diagnosis not present

## 2014-05-31 DIAGNOSIS — I5022 Chronic systolic (congestive) heart failure: Secondary | ICD-10-CM | POA: Insufficient documentation

## 2014-05-31 DIAGNOSIS — E1140 Type 2 diabetes mellitus with diabetic neuropathy, unspecified: Secondary | ICD-10-CM | POA: Diagnosis not present

## 2014-05-31 DIAGNOSIS — I25810 Atherosclerosis of coronary artery bypass graft(s) without angina pectoris: Secondary | ICD-10-CM | POA: Diagnosis not present

## 2014-05-31 DIAGNOSIS — I480 Paroxysmal atrial fibrillation: Secondary | ICD-10-CM

## 2014-05-31 DIAGNOSIS — Z7901 Long term (current) use of anticoagulants: Secondary | ICD-10-CM | POA: Diagnosis not present

## 2014-05-31 DIAGNOSIS — I129 Hypertensive chronic kidney disease with stage 1 through stage 4 chronic kidney disease, or unspecified chronic kidney disease: Secondary | ICD-10-CM | POA: Diagnosis not present

## 2014-05-31 DIAGNOSIS — Z79899 Other long term (current) drug therapy: Secondary | ICD-10-CM | POA: Diagnosis not present

## 2014-05-31 DIAGNOSIS — I6529 Occlusion and stenosis of unspecified carotid artery: Secondary | ICD-10-CM | POA: Insufficient documentation

## 2014-05-31 DIAGNOSIS — G2581 Restless legs syndrome: Secondary | ICD-10-CM | POA: Insufficient documentation

## 2014-05-31 DIAGNOSIS — I5043 Acute on chronic combined systolic (congestive) and diastolic (congestive) heart failure: Secondary | ICD-10-CM | POA: Diagnosis not present

## 2014-05-31 DIAGNOSIS — F329 Major depressive disorder, single episode, unspecified: Secondary | ICD-10-CM | POA: Insufficient documentation

## 2014-05-31 DIAGNOSIS — I2510 Atherosclerotic heart disease of native coronary artery without angina pectoris: Secondary | ICD-10-CM | POA: Insufficient documentation

## 2014-05-31 LAB — COMPREHENSIVE METABOLIC PANEL
ALT: 32 U/L (ref 0–53)
AST: 27 U/L (ref 0–37)
Albumin: 3.2 g/dL — ABNORMAL LOW (ref 3.5–5.2)
Alkaline Phosphatase: 61 U/L (ref 39–117)
Anion gap: 9 (ref 5–15)
BUN: 44 mg/dL — AB (ref 6–23)
CO2: 28 mmol/L (ref 19–32)
Calcium: 9.3 mg/dL (ref 8.4–10.5)
Chloride: 103 mmol/L (ref 96–112)
Creatinine, Ser: 2.25 mg/dL — ABNORMAL HIGH (ref 0.50–1.35)
GFR calc Af Amer: 29 mL/min — ABNORMAL LOW (ref >90)
GFR calc non Af Amer: 25 mL/min — ABNORMAL LOW (ref >90)
GLUCOSE: 296 mg/dL — AB (ref 70–99)
Potassium: 4.4 mmol/L (ref 3.5–5.1)
Sodium: 140 mmol/L (ref 135–145)
TOTAL PROTEIN: 6.3 g/dL (ref 6.0–8.3)
Total Bilirubin: 0.7 mg/dL (ref 0.3–1.2)

## 2014-05-31 LAB — TSH: TSH: 2.301 u[IU]/mL (ref 0.350–4.500)

## 2014-05-31 LAB — DIGOXIN LEVEL: DIGOXIN LVL: 0.6 ng/mL — AB (ref 0.8–2.0)

## 2014-05-31 LAB — BRAIN NATRIURETIC PEPTIDE: B Natriuretic Peptide: 467.6 pg/mL — ABNORMAL HIGH (ref 0.0–100.0)

## 2014-05-31 MED ORDER — CARVEDILOL 6.25 MG PO TABS: 6.2500 mg | ORAL_TABLET | Freq: Two times a day (BID) | ORAL | Status: DC

## 2014-05-31 NOTE — Patient Instructions (Signed)
Increase Carvedilol to 6.25 mg Twice daily   Labs today  Your physician recommends that you schedule a follow-up appointment in: 2 weeks

## 2014-05-31 NOTE — Progress Notes (Signed)
Patient ID: [name], male   DOB: 05/20/1928, 81 y.o.   MRN: [id number] PCP: Dr. Regis Bill  81 yo with history of CAD s/p CABG, ischemic cardiomyopathy, and paroxysmal atrial fibrillation presents for followup of recent hospital admission.  Patient was admitted in 2/16 with CHF and diuresed, he was sent home on Lasix 60 mg bid.  He missed 3 days of the pm Lasix dose and was re-admitted on 05/18/14 with acute on chronic systolic CHF with dyspnea and hypoxemia. He was diuresed with Lasix gtt and metolazone.  While in the hospital, he went into atrial fibrillation with RVR requiring cardioversion.  Creatinine was elevated and ramipril was stopped.  We had to cut back on Coreg due to hypotension and low output.  He was begun on low dose digoxin given CKD.    He returns for followup today.  Weight is down about 20 lbs compared to prior to hospitalization.  Weight is stable at home, fluctuating 205-207.  Breathing is improved compared to pre-hospital.  No dyspnea walking on flat ground.  No orthopnea or PND.  No tachypalpitations.  He is not in atrial fibrillation today.  No chest pain, no lightheadedness.    ECG: A-V sequential pacing (BiV pacing)  Labs (3/16): K 4.2, creatinine 2.26, digoxin < 0.2  PMH: 1. CAD: S/p CABG 2005 with LIMA-Diagonal, SVG-LAD, SVG-LCx, SVG-RCA.  Cardiolite in 2/15 with EF 19%, no ischemia.  2. Ischemic cardiomyopathy: Echo (2/16) with EF 20-25%, severe LV dilation with RWMAs, moderate AI, moderate MR, moderately decreased RV systolic function.  [employer] CRT-D device.  3. Atrial fibrillation: Paroxysmal.  DCCV 3/16.  4. CKD 5. Carotid stenosis: Carotid dopplers (7/15) with 60-79% RICA stenosis.  6. HTN 7. Hyperlipidemia 8. Restless leg syndrome. 9. Type 2 diabetes. 10. OA 11. Depression 12. H/o CCY 48. H/o appy  SH: Married, quit smoking 1980, [place] [employer] vet  FH: CAD  ROS: All systems reviewed and negative except as per HPI.   Current Outpatient Prescriptions   Medication Sig Dispense Refill  . acetaminophen (TYLENOL) 500 MG tablet Take 500 mg by mouth daily as needed for mild pain.    [name] amiodarone (PACERONE) 200 MG tablet Take 2 tablets PO daily for 10 days, then 1 tablet daily 60 tablet 3  . apixaban (ELIQUIS) 2.5 MG TABS tablet Take 1 tablet (2.5 mg total) by mouth 2 (two) times daily. 180 tablet 3  . atorvastatin (LIPITOR) 40 MG tablet Take 1 tablet (40 mg total) by mouth daily. 90 tablet 3  . carvedilol (COREG) 6.25 MG tablet Take 1 tablet (6.25 mg total) by mouth 2 (two) times daily with a meal. 60 tablet 3  . digoxin 62.5 MCG TABS Take 0.0625 mg by mouth daily. 30 tablet 0  . escitalopram (LEXAPRO) 10 MG tablet Take 1 tablet (10 mg total) by mouth daily. 90 tablet 1  . glipiZIDE (GLUCOTROL) 10 MG tablet Take 1 tablet (10 mg total) by mouth daily before breakfast. 180 tablet 1  . hydrALAZINE (APRESOLINE) 25 MG tablet Take 1 tablet (25 mg total) by mouth every 8 (eight) hours. 90 tablet 0  . Insulin Detemir (LEVEMIR) 100 UNIT/ML Pen Inject 18 Units into the skin at bedtime. as directed (Patient taking differently: Inject 18 Units into the skin at bedtime. ) 2 pen 0  . isosorbide mononitrate (IMDUR) 30 MG 24 hr tablet Take 1 tablet (30 mg total) by mouth daily. 30 tablet 0  . potassium chloride SA (K-DUR,KLOR-CON) 20 MEQ tablet Take  2 tablets (40 mEq total) by mouth daily. 60 tablet 0  . pramipexole (MIRAPEX) 0.5 MG tablet Take 2-3 hours before sleep for restless leg. (Patient taking differently: Take 0.5 mg by mouth every evening. Take 2-3 hours before sleep for restless leg.) 90 tablet 2  . saxagliptin HCl (ONGLYZA) 5 MG TABS tablet Take 1 tablet (5 mg total) by mouth daily. 90 tablet 1  . silodosin (RAPAFLO) 8 MG CAPS capsule Take 1 capsule (8 mg total) by mouth daily with breakfast. 90 capsule 1  . spironolactone (ALDACTONE) 25 MG tablet Take 0.5 tablets (12.5 mg total) by mouth daily. 30 tablet 0  . torsemide (DEMADEX) 20 MG tablet Take 3  tablets (60 mg total) by mouth daily. 90 tablet 0   No current facility-administered medications for this encounter.   BP 118/48 mmHg  Pulse 75  Wt 215 lb 12.8 oz (97.886 kg)  SpO2 97% General: NAD Neck: No JVD, no thyromegaly or thyroid nodule.  Lungs: Clear to auscultation bilaterally with normal respiratory effort. CV: Nondisplaced PMI.  Heart regular S1/S2, no S3/S4, 2/6 early SEM.  1+ ankle edema.  No carotid bruit.  Normal pedal pulses.  Abdomen: Soft, nontender, no hepatosplenomegaly, mild to modeate distention.  Skin: Intact without lesions or rashes.  Neurologic: Alert and oriented x 3.  Psych: Normal affect. Extremities: No clubbing or cyanosis.  HEENT: Normal.   Assessment/Plan: 1. Chronic systolic CHF: Ischemic cardiomyopathy.  EF 20-25% by 2/16 echo with moderate RV dysfunction.  He is down 20 lbs compared to prior to hospitalization.  Weight is stable at home on torsemide 60 mg daily.  NYHA class II-III symptoms currently.  St Jude CRT-D.  On exam, he does not look volume overloaded.  - Continue torsemide 60 mg daily, check BMET/BNP today.  - Continue hydralazine/Imdur at current dosing.  Will leave off ACEI for now with recent rise in creatinine.  - Continue current spironolactone. - Continue current digoxin.  Will check digoxin level today.   - Increase Coreg to 6.25 mg bid.  2. Atrial fibrillation: Paroxysmal.  He is a-paced today.  Continue amiodarone and Eliuqius (dosed at 2.5 mg bid with age and renal dysfunction).  Check TSH and LFTs today, will need to make sure to have regular eye exam with amiodarone.  3. CKD: As above, checking BMET today. Will remain off ACEI for now.  4. CAD: No chest pain, stable.  Continue on statin.  He is not on ASA 81 given stable CAD and Eliquis use.  5. Carotid stenosis: Repeat carotid dopplers 7/16.   [name] 05/31/2014

## 2014-06-01 DIAGNOSIS — I480 Paroxysmal atrial fibrillation: Secondary | ICD-10-CM | POA: Diagnosis not present

## 2014-06-01 DIAGNOSIS — I5043 Acute on chronic combined systolic (congestive) and diastolic (congestive) heart failure: Secondary | ICD-10-CM | POA: Diagnosis not present

## 2014-06-01 DIAGNOSIS — I2510 Atherosclerotic heart disease of native coronary artery without angina pectoris: Secondary | ICD-10-CM | POA: Diagnosis not present

## 2014-06-01 DIAGNOSIS — E1140 Type 2 diabetes mellitus with diabetic neuropathy, unspecified: Secondary | ICD-10-CM | POA: Diagnosis not present

## 2014-06-01 DIAGNOSIS — I129 Hypertensive chronic kidney disease with stage 1 through stage 4 chronic kidney disease, or unspecified chronic kidney disease: Secondary | ICD-10-CM | POA: Diagnosis not present

## 2014-06-01 DIAGNOSIS — N183 Chronic kidney disease, stage 3 (moderate): Secondary | ICD-10-CM | POA: Diagnosis not present

## 2014-06-01 NOTE — Telephone Encounter (Signed)
Spoke to [employer].  Informed her to call Dr. Jacalyn Lefevre office.  Gave her the telephone number to cardiology.

## 2014-06-01 NOTE — Addendum Note (Signed)
Encounter addended by: Martie Lee, NT on: 06/01/2014  8:36 AM<BR>     Documentation filed: Charges VN

## 2014-06-02 ENCOUNTER — Encounter: Payer: Self-pay | Admitting: Internal Medicine

## 2014-06-02 DIAGNOSIS — N183 Chronic kidney disease, stage 3 (moderate): Secondary | ICD-10-CM | POA: Diagnosis not present

## 2014-06-02 DIAGNOSIS — I129 Hypertensive chronic kidney disease with stage 1 through stage 4 chronic kidney disease, or unspecified chronic kidney disease: Secondary | ICD-10-CM | POA: Diagnosis not present

## 2014-06-02 DIAGNOSIS — I5043 Acute on chronic combined systolic (congestive) and diastolic (congestive) heart failure: Secondary | ICD-10-CM | POA: Diagnosis not present

## 2014-06-02 DIAGNOSIS — E1140 Type 2 diabetes mellitus with diabetic neuropathy, unspecified: Secondary | ICD-10-CM | POA: Diagnosis not present

## 2014-06-02 DIAGNOSIS — I2510 Atherosclerotic heart disease of native coronary artery without angina pectoris: Secondary | ICD-10-CM | POA: Diagnosis not present

## 2014-06-02 DIAGNOSIS — I480 Paroxysmal atrial fibrillation: Secondary | ICD-10-CM | POA: Diagnosis not present

## 2014-06-03 DIAGNOSIS — I2510 Atherosclerotic heart disease of native coronary artery without angina pectoris: Secondary | ICD-10-CM | POA: Diagnosis not present

## 2014-06-03 DIAGNOSIS — I129 Hypertensive chronic kidney disease with stage 1 through stage 4 chronic kidney disease, or unspecified chronic kidney disease: Secondary | ICD-10-CM | POA: Diagnosis not present

## 2014-06-03 DIAGNOSIS — I5043 Acute on chronic combined systolic (congestive) and diastolic (congestive) heart failure: Secondary | ICD-10-CM | POA: Diagnosis not present

## 2014-06-03 DIAGNOSIS — N183 Chronic kidney disease, stage 3 (moderate): Secondary | ICD-10-CM | POA: Diagnosis not present

## 2014-06-03 DIAGNOSIS — E1140 Type 2 diabetes mellitus with diabetic neuropathy, unspecified: Secondary | ICD-10-CM | POA: Diagnosis not present

## 2014-06-03 DIAGNOSIS — I480 Paroxysmal atrial fibrillation: Secondary | ICD-10-CM | POA: Diagnosis not present

## 2014-06-04 ENCOUNTER — Ambulatory Visit: Payer: Medicare Other | Admitting: Cardiology

## 2014-06-04 NOTE — Telephone Encounter (Signed)
Message     Ok for plan as outlined    Kirk Ruths

## 2014-06-07 DIAGNOSIS — I5043 Acute on chronic combined systolic (congestive) and diastolic (congestive) heart failure: Secondary | ICD-10-CM | POA: Diagnosis not present

## 2014-06-07 DIAGNOSIS — E1140 Type 2 diabetes mellitus with diabetic neuropathy, unspecified: Secondary | ICD-10-CM | POA: Diagnosis not present

## 2014-06-07 DIAGNOSIS — I480 Paroxysmal atrial fibrillation: Secondary | ICD-10-CM | POA: Diagnosis not present

## 2014-06-07 DIAGNOSIS — N183 Chronic kidney disease, stage 3 (moderate): Secondary | ICD-10-CM | POA: Diagnosis not present

## 2014-06-07 DIAGNOSIS — I129 Hypertensive chronic kidney disease with stage 1 through stage 4 chronic kidney disease, or unspecified chronic kidney disease: Secondary | ICD-10-CM | POA: Diagnosis not present

## 2014-06-07 DIAGNOSIS — I2510 Atherosclerotic heart disease of native coronary artery without angina pectoris: Secondary | ICD-10-CM | POA: Diagnosis not present

## 2014-06-08 ENCOUNTER — Telehealth (HOSPITAL_COMMUNITY): Payer: Self-pay | Admitting: Cardiology

## 2014-06-08 DIAGNOSIS — I129 Hypertensive chronic kidney disease with stage 1 through stage 4 chronic kidney disease, or unspecified chronic kidney disease: Secondary | ICD-10-CM | POA: Diagnosis not present

## 2014-06-08 DIAGNOSIS — I480 Paroxysmal atrial fibrillation: Secondary | ICD-10-CM | POA: Diagnosis not present

## 2014-06-08 DIAGNOSIS — I5043 Acute on chronic combined systolic (congestive) and diastolic (congestive) heart failure: Secondary | ICD-10-CM | POA: Diagnosis not present

## 2014-06-08 DIAGNOSIS — I2510 Atherosclerotic heart disease of native coronary artery without angina pectoris: Secondary | ICD-10-CM | POA: Diagnosis not present

## 2014-06-08 DIAGNOSIS — E1140 Type 2 diabetes mellitus with diabetic neuropathy, unspecified: Secondary | ICD-10-CM | POA: Diagnosis not present

## 2014-06-08 DIAGNOSIS — N183 Chronic kidney disease, stage 3 (moderate): Secondary | ICD-10-CM | POA: Diagnosis not present

## 2014-06-08 NOTE — Telephone Encounter (Signed)
[name] called with concerns regarding weight gain HH does not have sliding scale protocol pts weight was 206, 204 today 208 Pt is asymptomatic however his abdominal girth is elevated x 5 cm [name] is concerned as this is how the pt got in trouble in the past  Please advise  Pt reports he is doing fine, decreased UOP  Advised pt to continue medications as prescribed and will call in the AM Will need BMET 06/09/14

## 2014-06-09 ENCOUNTER — Telehealth (HOSPITAL_COMMUNITY): Payer: Self-pay

## 2014-06-09 DIAGNOSIS — I129 Hypertensive chronic kidney disease with stage 1 through stage 4 chronic kidney disease, or unspecified chronic kidney disease: Secondary | ICD-10-CM | POA: Diagnosis not present

## 2014-06-09 DIAGNOSIS — I480 Paroxysmal atrial fibrillation: Secondary | ICD-10-CM | POA: Diagnosis not present

## 2014-06-09 DIAGNOSIS — I5043 Acute on chronic combined systolic (congestive) and diastolic (congestive) heart failure: Secondary | ICD-10-CM | POA: Diagnosis not present

## 2014-06-09 DIAGNOSIS — I2510 Atherosclerotic heart disease of native coronary artery without angina pectoris: Secondary | ICD-10-CM | POA: Diagnosis not present

## 2014-06-09 DIAGNOSIS — E1140 Type 2 diabetes mellitus with diabetic neuropathy, unspecified: Secondary | ICD-10-CM | POA: Diagnosis not present

## 2014-06-09 DIAGNOSIS — N183 Chronic kidney disease, stage 3 (moderate): Secondary | ICD-10-CM | POA: Diagnosis not present

## 2014-06-09 NOTE — Telephone Encounter (Signed)
Followed up with patient and wife who states his weight is down a pound today at 207lb.  Confirms that he feels "just fine" and is able to do chores and things around the house as normal, no shortness of breath, no swelling or edema.  Does not notice much difference in his belly.  Will get a bmet and bnp with [facility] to follow up.  Has appointment with [place] next week.  Advised to call us back if he becomes symptomatic or if weight continues to go up.  Patient aware and agreeable to plan.  Renee Pain

## 2014-06-09 NOTE — Telephone Encounter (Signed)
error 

## 2014-06-10 DIAGNOSIS — I480 Paroxysmal atrial fibrillation: Secondary | ICD-10-CM | POA: Diagnosis not present

## 2014-06-10 DIAGNOSIS — I2510 Atherosclerotic heart disease of native coronary artery without angina pectoris: Secondary | ICD-10-CM | POA: Diagnosis not present

## 2014-06-10 DIAGNOSIS — E1140 Type 2 diabetes mellitus with diabetic neuropathy, unspecified: Secondary | ICD-10-CM | POA: Diagnosis not present

## 2014-06-10 DIAGNOSIS — I129 Hypertensive chronic kidney disease with stage 1 through stage 4 chronic kidney disease, or unspecified chronic kidney disease: Secondary | ICD-10-CM | POA: Diagnosis not present

## 2014-06-10 DIAGNOSIS — N183 Chronic kidney disease, stage 3 (moderate): Secondary | ICD-10-CM | POA: Diagnosis not present

## 2014-06-10 DIAGNOSIS — I5043 Acute on chronic combined systolic (congestive) and diastolic (congestive) heart failure: Secondary | ICD-10-CM | POA: Diagnosis not present

## 2014-06-14 ENCOUNTER — Telehealth (HOSPITAL_COMMUNITY): Payer: Self-pay

## 2014-06-14 ENCOUNTER — Other Ambulatory Visit (INDEPENDENT_AMBULATORY_CARE_PROVIDER_SITE_OTHER): Payer: Medicare Other

## 2014-06-14 DIAGNOSIS — E1140 Type 2 diabetes mellitus with diabetic neuropathy, unspecified: Secondary | ICD-10-CM | POA: Diagnosis not present

## 2014-06-14 DIAGNOSIS — D649 Anemia, unspecified: Secondary | ICD-10-CM | POA: Diagnosis not present

## 2014-06-14 DIAGNOSIS — I480 Paroxysmal atrial fibrillation: Secondary | ICD-10-CM | POA: Diagnosis not present

## 2014-06-14 DIAGNOSIS — E785 Hyperlipidemia, unspecified: Secondary | ICD-10-CM

## 2014-06-14 DIAGNOSIS — I5043 Acute on chronic combined systolic (congestive) and diastolic (congestive) heart failure: Secondary | ICD-10-CM | POA: Diagnosis not present

## 2014-06-14 DIAGNOSIS — N183 Chronic kidney disease, stage 3 (moderate): Secondary | ICD-10-CM | POA: Diagnosis not present

## 2014-06-14 DIAGNOSIS — I129 Hypertensive chronic kidney disease with stage 1 through stage 4 chronic kidney disease, or unspecified chronic kidney disease: Secondary | ICD-10-CM | POA: Diagnosis not present

## 2014-06-14 DIAGNOSIS — I10 Essential (primary) hypertension: Secondary | ICD-10-CM | POA: Diagnosis not present

## 2014-06-14 DIAGNOSIS — I2510 Atherosclerotic heart disease of native coronary artery without angina pectoris: Secondary | ICD-10-CM | POA: Diagnosis not present

## 2014-06-14 LAB — CBC WITH DIFFERENTIAL/PLATELET
BASOS ABS: 0 10*3/uL (ref 0.0–0.1)
Basophils Relative: 0.3 % (ref 0.0–3.0)
Eosinophils Absolute: 0.1 10*3/uL (ref 0.0–0.7)
Eosinophils Relative: 0.9 % (ref 0.0–5.0)
HCT: 38 % — ABNORMAL LOW (ref 39.0–52.0)
Hemoglobin: 12.4 g/dL — ABNORMAL LOW (ref 13.0–17.0)
LYMPHS PCT: 16.1 % (ref 12.0–46.0)
Lymphs Abs: 1.2 10*3/uL (ref 0.7–4.0)
MCHC: 32.7 g/dL (ref 30.0–36.0)
MCV: 90.6 fl (ref 78.0–100.0)
MONO ABS: 0.7 10*3/uL (ref 0.1–1.0)
Monocytes Relative: 8.7 % (ref 3.0–12.0)
NEUTROS PCT: 74 % (ref 43.0–77.0)
Neutro Abs: 5.7 10*3/uL (ref 1.4–7.7)
Platelets: 148 10*3/uL — ABNORMAL LOW (ref 150.0–400.0)
RBC: 4.19 Mil/uL — ABNORMAL LOW (ref 4.22–5.81)
RDW: 15.6 % — ABNORMAL HIGH (ref 11.5–15.5)
WBC: 7.7 10*3/uL (ref 4.0–10.5)

## 2014-06-14 LAB — BASIC METABOLIC PANEL
BUN: 41 mg/dL — AB (ref 6–23)
CO2: 30 mEq/L (ref 19–32)
Calcium: 9.9 mg/dL (ref 8.4–10.5)
Chloride: 106 mEq/L (ref 96–112)
Creatinine, Ser: 2.14 mg/dL — ABNORMAL HIGH (ref 0.40–1.50)
GFR: 31.44 mL/min — ABNORMAL LOW (ref >60.00)
Glucose, Bld: 86 mg/dL (ref 70–99)
POTASSIUM: 4.5 meq/L (ref 3.5–5.1)
SODIUM: 143 meq/L (ref 135–145)

## 2014-06-14 LAB — LIPID PANEL
Cholesterol: 87 mg/dL (ref 0–200)
HDL: 45.5 mg/dL (ref >39.00)
LDL CALC: 28 mg/dL (ref 0–99)
NonHDL: 41.5
TRIGLYCERIDES: 69 mg/dL (ref 0.0–149.0)
Total CHOL/HDL Ratio: 2
VLDL: 13.8 mg/dL (ref 0.0–40.0)

## 2014-06-14 MED ORDER — POLYETHYLENE GLYCOL 3350 17 G PO PACK: 17.0000 g | PACK | Freq: Every day | ORAL | Status: DC

## 2014-06-14 NOTE — Telephone Encounter (Signed)
[name] with [facility] called c/o patient weight gain 2 lbs overnight and 8cm increase in abdominal girth in past week.  Patient not symptomatic, no shortness of breath, no swelling/edema, and feels fine.  Patient does c/o some constipation unrelieved by colace, recommended miralax PRN.  Patient will be seen in our clinic tomorrow.  [name]

## 2014-06-15 ENCOUNTER — Ambulatory Visit (HOSPITAL_COMMUNITY)
Admission: RE | Admit: 2014-06-15 | Discharge: 2014-06-15 | Disposition: A | Discharge status: Home / Self Care | Payer: Medicare Other | Source: Ambulatory Visit | Attending: Internal Medicine | Admitting: Internal Medicine

## 2014-06-15 ENCOUNTER — Encounter (HOSPITAL_COMMUNITY): Payer: Self-pay

## 2014-06-15 VITALS — BP 114/60 | HR 60 | Wt 215.8 lb

## 2014-06-15 DIAGNOSIS — E1140 Type 2 diabetes mellitus with diabetic neuropathy, unspecified: Secondary | ICD-10-CM | POA: Diagnosis not present

## 2014-06-15 DIAGNOSIS — I480 Paroxysmal atrial fibrillation: Secondary | ICD-10-CM

## 2014-06-15 DIAGNOSIS — N189 Chronic kidney disease, unspecified: Secondary | ICD-10-CM | POA: Insufficient documentation

## 2014-06-15 DIAGNOSIS — I2510 Atherosclerotic heart disease of native coronary artery without angina pectoris: Secondary | ICD-10-CM | POA: Insufficient documentation

## 2014-06-15 DIAGNOSIS — I2583 Coronary atherosclerosis due to lipid rich plaque: Secondary | ICD-10-CM

## 2014-06-15 DIAGNOSIS — I5022 Chronic systolic (congestive) heart failure: Secondary | ICD-10-CM | POA: Diagnosis not present

## 2014-06-15 DIAGNOSIS — I6529 Occlusion and stenosis of unspecified carotid artery: Secondary | ICD-10-CM | POA: Insufficient documentation

## 2014-06-15 DIAGNOSIS — I129 Hypertensive chronic kidney disease with stage 1 through stage 4 chronic kidney disease, or unspecified chronic kidney disease: Secondary | ICD-10-CM | POA: Diagnosis not present

## 2014-06-15 DIAGNOSIS — N183 Chronic kidney disease, stage 3 (moderate): Secondary | ICD-10-CM | POA: Diagnosis not present

## 2014-06-15 DIAGNOSIS — I5043 Acute on chronic combined systolic (congestive) and diastolic (congestive) heart failure: Secondary | ICD-10-CM | POA: Diagnosis not present

## 2014-06-15 MED ORDER — TORSEMIDE 20 MG PO TABS: 60.0000 mg | ORAL_TABLET | Freq: Every day | ORAL | Status: DC

## 2014-06-15 NOTE — Patient Instructions (Signed)
CONTINUE Torsemide 20 mg, 3 tabs (60 mg) daily you may take extra 20 mg for weight greater than 210 lbs  Your physician recommends that you schedule a follow-up appointment in: 6 weeks with Dr. [name]  Do the following things EVERYDAY: 1) Weigh yourself in the morning before breakfast. Write it down and keep it in a log. 2) Take your medicines as prescribed 3) Eat low salt foods-Limit salt (sodium) to 2000 mg per day.  4) Stay as active as you can everyday 5) Limit all fluids for the day to less than 2 liters 6)

## 2014-06-15 NOTE — Progress Notes (Signed)
Patient ID: [name], male   DOB: February 02, 1929, 81 y.o.   MRN: [id number] PCP: Dr. Regis Bill  81 yo with history of CAD s/p CABG, ischemic cardiomyopathy, and paroxysmal atrial fibrillation presents for followup of recent hospital admission.  Patient was admitted in 2/16 with CHF and diuresed, he was sent home on Lasix 60 mg bid.  He missed 3 days of the pm Lasix dose and was re-admitted on 05/18/14 with acute on chronic systolic CHF with dyspnea and hypoxemia. He was diuresed with Lasix gtt and metolazone.  While in the hospital, he went into atrial fibrillation with RVR requiring cardioversion.  Creatinine was elevated and ramipril was stopped.  We had to cut back on Coreg due to hypotension and low output.  He was begun on low dose digoxin given CKD.    He returns for follow up today.  Last visit coreg was increased to 6.25 mg twice a day. Denies SOB/PND/Orthopnea/CP. Weight at home has been stable at 207-209 pounds. SBP has been 112-139. Had sausage gravy on Sunday with 2 pound weight gain. Good appetite. Followed by [facility] for [facility] and HHPT. Taking all medications.   Labs (3/16): K 4.2, creatinine 2.26, digoxin < 0.2 Labs (05/31/2014): TSH 2.3 Dig level 0.6  Labs 06/14/2014: K 4.5 Creatinine 2.14   PMH: 1. CAD: S/p CABG 2005 with LIMA-Diagonal, SVG-LAD, SVG-LCx, SVG-RCA.  Cardiolite in 2/15 with EF 19%, no ischemia.  2. Ischemic cardiomyopathy: Echo (2/16) with EF 20-25%, severe LV dilation with RWMAs, moderate AI, moderate MR, moderately decreased RV systolic function.  St Jude CRT-D device.  3. Atrial fibrillation: Paroxysmal.  DCCV 3/16.  4. CKD 5. Carotid stenosis: Carotid dopplers (7/15) with 60-79% RICA stenosis.  6. HTN 7. Hyperlipidemia 8. Restless leg syndrome. 9. Type 2 diabetes. 10. OA 11. Depression 12. H/o CCY 65. H/o appy  SH: Married, quit smoking 1980, [place] [employer] vet  FH: CAD  ROS: All systems reviewed and negative except as per HPI.   Current Outpatient  Prescriptions  Medication Sig Dispense Refill  . acetaminophen (TYLENOL) 500 MG tablet Take 500 mg by mouth daily as needed for mild pain.    [name] amiodarone (PACERONE) 200 MG tablet Take 2 tablets PO daily for 10 days, then 1 tablet daily 60 tablet 3  . apixaban (ELIQUIS) 2.5 MG TABS tablet Take 1 tablet (2.5 mg total) by mouth 2 (two) times daily. 180 tablet 3  . atorvastatin (LIPITOR) 40 MG tablet Take 1 tablet (40 mg total) by mouth daily. 90 tablet 3  . carvedilol (COREG) 6.25 MG tablet Take 1 tablet (6.25 mg total) by mouth 2 (two) times daily with a meal. 60 tablet 3  . digoxin 62.5 MCG TABS Take 0.0625 mg by mouth daily. 30 tablet 0  . escitalopram (LEXAPRO) 10 MG tablet Take 1 tablet (10 mg total) by mouth daily. 90 tablet 1  . glipiZIDE (GLUCOTROL) 10 MG tablet Take 1 tablet (10 mg total) by mouth daily before breakfast. 180 tablet 1  . hydrALAZINE (APRESOLINE) 25 MG tablet Take 1 tablet (25 mg total) by mouth every 8 (eight) hours. 90 tablet 0  . Insulin Detemir (LEVEMIR) 100 UNIT/ML Pen Inject 18 Units into the skin at bedtime. as directed (Patient taking differently: Inject 18 Units into the skin at bedtime. ) 2 pen 0  . isosorbide mononitrate (IMDUR) 30 MG 24 hr tablet Take 1 tablet (30 mg total) by mouth daily. 30 tablet 0  . polyethylene glycol (MIRALAX) packet Take 17  g by mouth daily. 14 each 0  . potassium chloride SA (K-DUR,KLOR-CON) 20 MEQ tablet Take 2 tablets (40 mEq total) by mouth daily. 60 tablet 0  . pramipexole (MIRAPEX) 0.5 MG tablet Take 2-3 hours before sleep for restless leg. (Patient taking differently: Take 0.5 mg by mouth every evening. Take 2-3 hours before sleep for restless leg.) 90 tablet 2  . saxagliptin HCl (ONGLYZA) 5 MG TABS tablet Take 1 tablet (5 mg total) by mouth daily. 90 tablet 1  . silodosin (RAPAFLO) 8 MG CAPS capsule Take 1 capsule (8 mg total) by mouth daily with breakfast. 90 capsule 1  . spironolactone (ALDACTONE) 25 MG tablet Take 0.5 tablets  (12.5 mg total) by mouth daily. 30 tablet 0  . torsemide (DEMADEX) 20 MG tablet Take 3 tablets (60 mg total) by mouth daily. 90 tablet 0   No current facility-administered medications for this encounter.   BP 114/60 mmHg  Pulse 60  Wt 215 lb 12 oz (97.864 kg)  SpO2 97% General: NAD Neck: No JVD, no thyromegaly or thyroid nodule.  Lungs: Clear to auscultation bilaterally with normal respiratory effort. CV: Nondisplaced PMI.  Heart regular S1/S2, no S3/S4, 2/6 early SEM.  1+ ankle edema.  No carotid bruit.  Normal pedal pulses.  Abdomen: Soft, nontender, no hepatosplenomegaly, mild to modeate distention.  Skin: Intact without lesions or rashes.  Neurologic: Alert and oriented x 3.  Psych: Normal affect. Extremities: No clubbing or cyanosis.  HEENT: Normal.   Assessment/Plan: 1. Chronic systolic CHF: Ischemic cardiomyopathy.  EF 20-25% by 2/16 echo with moderate RV dysfunction.  - Volume status stable. Continue torsemide 60 mg daily and may take an extra 20 mg torsemide for weight 210 pounds or greater.  - Continue hydralazine/Imdur at current dosing.   - Will leave off ACEI for now with recent rise in creatinine.  - Continue current spironolactone. - Continue current digoxin.  Dig level 0.6 on March 14 th.   - HR 60 so continue 6.25 mg coreg twice a day. .  2. Atrial fibrillation: Paroxysmal.  He is a-paced today.  Continue amiodarone and Eliquis (dosed at 2.5 mg bid with age and renal dysfunction).  Will need to make sure to have regular eye exam with amiodarone.  3. CKD: Will remain off ACEI for now.  4. CAD: No chest pain, stable.  Continue on statin.  He is not on ASA 81 given stable CAD and Eliquis use.  5. Carotid stenosis: Repeat carotid dopplers 7/16.   Follow up in 6 weeks.   CLEGG,AMY NP-C  06/15/2014  Patient seen and examined with Darrick Grinder, NP. We discussed all aspects of the encounter. I agree with the assessment and plan as stated above.   He is doing quite well.  Volume status on the upper end of his range. Reinforced need for daily weights and reviewed use of sliding scale diuretics. Will check labs today. Continue current regimen. Eliquis dosing appropriate for age and renal function. Recent digoxin level ok. Given age, renal dysfunction and amiodarone therapy may be reasonable to consider stopping dig at some point.   Daniel Bensimhon,MD 8:14 PM

## 2014-06-16 ENCOUNTER — Telehealth (HOSPITAL_COMMUNITY): Payer: Self-pay | Admitting: Vascular Surgery

## 2014-06-16 DIAGNOSIS — I4891 Unspecified atrial fibrillation: Secondary | ICD-10-CM

## 2014-06-16 NOTE — Telephone Encounter (Signed)
Unsure which medications needs a refill Left message for pt/pts wife to call back with specific meds needed or contact pharmacy for refill request

## 2014-06-16 NOTE — Telephone Encounter (Signed)
Pt wife called ,she states none of pt medications prescribed for him yesterday was sent to [facility], she states it was 6 or 7 prescriptions that were supposed to be called in after his visit.[name] Please advise

## 2014-06-17 DIAGNOSIS — I5043 Acute on chronic combined systolic (congestive) and diastolic (congestive) heart failure: Secondary | ICD-10-CM | POA: Diagnosis not present

## 2014-06-17 DIAGNOSIS — N183 Chronic kidney disease, stage 3 (moderate): Secondary | ICD-10-CM | POA: Diagnosis not present

## 2014-06-17 DIAGNOSIS — E1140 Type 2 diabetes mellitus with diabetic neuropathy, unspecified: Secondary | ICD-10-CM | POA: Diagnosis not present

## 2014-06-17 DIAGNOSIS — I480 Paroxysmal atrial fibrillation: Secondary | ICD-10-CM | POA: Diagnosis not present

## 2014-06-17 DIAGNOSIS — I2510 Atherosclerotic heart disease of native coronary artery without angina pectoris: Secondary | ICD-10-CM | POA: Diagnosis not present

## 2014-06-17 DIAGNOSIS — I129 Hypertensive chronic kidney disease with stage 1 through stage 4 chronic kidney disease, or unspecified chronic kidney disease: Secondary | ICD-10-CM | POA: Diagnosis not present

## 2014-06-21 ENCOUNTER — Encounter: Payer: Self-pay | Admitting: Internal Medicine

## 2014-06-21 ENCOUNTER — Ambulatory Visit (INDEPENDENT_AMBULATORY_CARE_PROVIDER_SITE_OTHER): Payer: Medicare Other | Admitting: Internal Medicine

## 2014-06-21 VITALS — BP 124/54 | Temp 97.6°F | Ht 71.0 in | Wt 210.0 lb

## 2014-06-21 DIAGNOSIS — Z0000 Encounter for general adult medical examination without abnormal findings: Secondary | ICD-10-CM | POA: Diagnosis not present

## 2014-06-21 DIAGNOSIS — N183 Chronic kidney disease, stage 3 unspecified: Secondary | ICD-10-CM

## 2014-06-21 DIAGNOSIS — I255 Ischemic cardiomyopathy: Secondary | ICD-10-CM

## 2014-06-21 DIAGNOSIS — Z87898 Personal history of other specified conditions: Secondary | ICD-10-CM

## 2014-06-21 DIAGNOSIS — E1140 Type 2 diabetes mellitus with diabetic neuropathy, unspecified: Secondary | ICD-10-CM

## 2014-06-21 DIAGNOSIS — N289 Disorder of kidney and ureter, unspecified: Secondary | ICD-10-CM | POA: Diagnosis not present

## 2014-06-21 DIAGNOSIS — M5431 Sciatica, right side: Secondary | ICD-10-CM

## 2014-06-21 DIAGNOSIS — Z79899 Other long term (current) drug therapy: Secondary | ICD-10-CM | POA: Diagnosis not present

## 2014-06-21 DIAGNOSIS — I509 Heart failure, unspecified: Secondary | ICD-10-CM

## 2014-06-21 DIAGNOSIS — Z789 Other specified health status: Secondary | ICD-10-CM

## 2014-06-21 NOTE — Patient Instructions (Signed)
Can try 500 mg of tylenol  2-3 x per day of needed for sciatica . Do not take advil Tori Milks products . Can be toxic to the kidneys and  Destabilize   heart failure.   Blood sugars are excellent  but want to avoid low BGs.   Decrease  levemir  10 units per day. for now and continue the monitor .   Would like readings  More in the 90 - 110 range  Instead of 70 - 90 .  Anemia is getting better . Consider a [place] formal advance directive   ( see  Hand out)  Make sure [facility] is sending   Information to cardiology.   contnue yearly flu vaccine  Fall prevention.  Fall Prevention and Home Safety Falls cause injuries and can affect all age groups. It is possible to use preventive measures to significantly decrease the likelihood of falls. There are many simple measures which can make your home safer and prevent falls. OUTDOORS  Repair cracks and edges of walkways and driveways.  Remove high doorway thresholds.  Trim shrubbery on the main path into your home.  Have good outside lighting.  Clear walkways of tools, rocks, debris, and clutter.  Check that handrails are not broken and are securely fastened. Both sides of steps should have handrails.  Have leaves, snow, and ice cleared regularly.  Use sand or salt on walkways during winter months.  In the garage, clean up grease or oil spills. BATHROOM  Install night lights.  Install grab bars by the toilet and in the tub and shower.  Use non-skid mats or decals in the tub or shower.  Place a plastic non-slip stool in the shower to sit on, if needed.  Keep floors dry and clean up all water on the floor immediately.  Remove soap buildup in the tub or shower on a regular basis.  Secure bath mats with non-slip, double-sided rug tape.  Remove throw rugs and tripping hazards from the floors. BEDROOMS  Install night lights.  Make sure a bedside light is easy to reach.  Do not use oversized bedding.  Keep a telephone by your  bedside.  Have a firm chair with side arms to use for getting dressed.  Remove throw rugs and tripping hazards from the floor. KITCHEN  Keep handles on pots and pans turned toward the center of the stove. Use back burners when possible.  Clean up spills quickly and allow time for drying.  Avoid walking on wet floors.  Avoid hot utensils and knives.  Position shelves so they are not too high or low.  Place commonly used objects within easy reach.  If necessary, use a sturdy step stool with a grab bar when reaching.  Keep electrical cables out of the way.  Do not use floor polish or wax that makes floors slippery. If you must use wax, use non-skid floor wax.  Remove throw rugs and tripping hazards from the floor. STAIRWAYS  Never leave objects on stairs.  Place handrails on both sides of stairways and use them. Fix any loose handrails. Make sure handrails on both sides of the stairways are as long as the stairs.  Check carpeting to make sure it is firmly attached along stairs. Make repairs to worn or loose carpet promptly.  Avoid placing throw rugs at the top or bottom of stairways, or properly secure the rug with carpet tape to prevent slippage. Get rid of throw rugs, if possible.  Have an electrician put in  a light switch at the top and bottom of the stairs. OTHER FALL PREVENTION TIPS  Wear low-heel or rubber-soled shoes that are supportive and fit well. Wear closed toe shoes.  When using a stepladder, make sure it is fully opened and both spreaders are firmly locked. Do not climb a closed stepladder.  Add color or contrast paint or tape to grab bars and handrails in your home. Place contrasting color strips on first and last steps.  Learn and use mobility aids as needed. Install an electrical emergency response system.  Turn on lights to avoid dark areas. Replace light bulbs that burn out immediately. Get light switches that glow.  Arrange furniture to create clear  pathways. Keep furniture in the same place.  Firmly attach carpet with non-skid or double-sided tape.  Eliminate uneven floor surfaces.  Select a carpet pattern that does not visually hide the edge of steps.  Be aware of all pets. OTHER HOME SAFETY TIPS  Set the water temperature for 120 F (48.8 C).  Keep emergency numbers on or near the telephone.  Keep smoke detectors on every level of the home and near sleeping areas. Document Released: 02/23/2002 Document Revised: 09/04/2011 Document Reviewed: 05/25/2011 [facility] Patient Information 2015 [place], [place]. This information is not intended to replace advice given to you by your health care provider. Make sure you discuss any questions you have with your health care provider.
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
## 2014-06-22 ENCOUNTER — Other Ambulatory Visit: Payer: Self-pay | Admitting: *Deleted

## 2014-06-22 DIAGNOSIS — I5043 Acute on chronic combined systolic (congestive) and diastolic (congestive) heart failure: Secondary | ICD-10-CM | POA: Diagnosis not present

## 2014-06-22 DIAGNOSIS — I2510 Atherosclerotic heart disease of native coronary artery without angina pectoris: Secondary | ICD-10-CM | POA: Diagnosis not present

## 2014-06-22 DIAGNOSIS — E1140 Type 2 diabetes mellitus with diabetic neuropathy, unspecified: Secondary | ICD-10-CM | POA: Diagnosis not present

## 2014-06-22 DIAGNOSIS — I25810 Atherosclerosis of coronary artery bypass graft(s) without angina pectoris: Secondary | ICD-10-CM

## 2014-06-22 DIAGNOSIS — N183 Chronic kidney disease, stage 3 (moderate): Secondary | ICD-10-CM | POA: Diagnosis not present

## 2014-06-22 DIAGNOSIS — I129 Hypertensive chronic kidney disease with stage 1 through stage 4 chronic kidney disease, or unspecified chronic kidney disease: Secondary | ICD-10-CM | POA: Diagnosis not present

## 2014-06-22 DIAGNOSIS — I480 Paroxysmal atrial fibrillation: Secondary | ICD-10-CM | POA: Diagnosis not present

## 2014-06-22 MED ORDER — POTASSIUM CHLORIDE CRYS ER 20 MEQ PO TBCR: 40.0000 meq | EXTENDED_RELEASE_TABLET | Freq: Every day | ORAL | Status: DC

## 2014-06-22 MED ORDER — SPIRONOLACTONE 25 MG PO TABS: 12.5000 mg | ORAL_TABLET | Freq: Every day | ORAL | Status: DC

## 2014-06-22 MED ORDER — HYDRALAZINE HCL 25 MG PO TABS: 25.0000 mg | ORAL_TABLET | Freq: Three times a day (TID) | ORAL | Status: DC

## 2014-06-22 MED ORDER — CARVEDILOL 6.25 MG PO TABS: 6.2500 mg | ORAL_TABLET | Freq: Two times a day (BID) | ORAL | Status: DC

## 2014-06-23 DIAGNOSIS — I129 Hypertensive chronic kidney disease with stage 1 through stage 4 chronic kidney disease, or unspecified chronic kidney disease: Secondary | ICD-10-CM | POA: Diagnosis not present

## 2014-06-23 DIAGNOSIS — I2510 Atherosclerotic heart disease of native coronary artery without angina pectoris: Secondary | ICD-10-CM | POA: Diagnosis not present

## 2014-06-23 DIAGNOSIS — N183 Chronic kidney disease, stage 3 (moderate): Secondary | ICD-10-CM | POA: Diagnosis not present

## 2014-06-23 DIAGNOSIS — I5043 Acute on chronic combined systolic (congestive) and diastolic (congestive) heart failure: Secondary | ICD-10-CM | POA: Diagnosis not present

## 2014-06-23 DIAGNOSIS — I480 Paroxysmal atrial fibrillation: Secondary | ICD-10-CM | POA: Diagnosis not present

## 2014-06-23 DIAGNOSIS — E1140 Type 2 diabetes mellitus with diabetic neuropathy, unspecified: Secondary | ICD-10-CM | POA: Diagnosis not present

## 2014-06-24 DIAGNOSIS — I129 Hypertensive chronic kidney disease with stage 1 through stage 4 chronic kidney disease, or unspecified chronic kidney disease: Secondary | ICD-10-CM | POA: Diagnosis not present

## 2014-06-24 DIAGNOSIS — I480 Paroxysmal atrial fibrillation: Secondary | ICD-10-CM | POA: Diagnosis not present

## 2014-06-24 DIAGNOSIS — I2510 Atherosclerotic heart disease of native coronary artery without angina pectoris: Secondary | ICD-10-CM | POA: Diagnosis not present

## 2014-06-24 DIAGNOSIS — E1140 Type 2 diabetes mellitus with diabetic neuropathy, unspecified: Secondary | ICD-10-CM | POA: Diagnosis not present

## 2014-06-24 DIAGNOSIS — N183 Chronic kidney disease, stage 3 (moderate): Secondary | ICD-10-CM | POA: Diagnosis not present

## 2014-06-24 DIAGNOSIS — I5043 Acute on chronic combined systolic (congestive) and diastolic (congestive) heart failure: Secondary | ICD-10-CM | POA: Diagnosis not present

## 2014-06-25 ENCOUNTER — Encounter: Payer: Self-pay | Admitting: *Deleted

## 2014-06-28 DIAGNOSIS — I2510 Atherosclerotic heart disease of native coronary artery without angina pectoris: Secondary | ICD-10-CM | POA: Diagnosis not present

## 2014-06-28 DIAGNOSIS — E1140 Type 2 diabetes mellitus with diabetic neuropathy, unspecified: Secondary | ICD-10-CM | POA: Diagnosis not present

## 2014-06-28 DIAGNOSIS — I5043 Acute on chronic combined systolic (congestive) and diastolic (congestive) heart failure: Secondary | ICD-10-CM | POA: Diagnosis not present

## 2014-06-28 DIAGNOSIS — I480 Paroxysmal atrial fibrillation: Secondary | ICD-10-CM | POA: Diagnosis not present

## 2014-06-28 DIAGNOSIS — N183 Chronic kidney disease, stage 3 (moderate): Secondary | ICD-10-CM | POA: Diagnosis not present

## 2014-06-28 DIAGNOSIS — I129 Hypertensive chronic kidney disease with stage 1 through stage 4 chronic kidney disease, or unspecified chronic kidney disease: Secondary | ICD-10-CM | POA: Diagnosis not present

## 2014-07-01 ENCOUNTER — Telehealth: Payer: Self-pay | Admitting: Internal Medicine

## 2014-07-01 DIAGNOSIS — I2510 Atherosclerotic heart disease of native coronary artery without angina pectoris: Secondary | ICD-10-CM | POA: Diagnosis not present

## 2014-07-01 DIAGNOSIS — I5043 Acute on chronic combined systolic (congestive) and diastolic (congestive) heart failure: Secondary | ICD-10-CM | POA: Diagnosis not present

## 2014-07-01 DIAGNOSIS — I129 Hypertensive chronic kidney disease with stage 1 through stage 4 chronic kidney disease, or unspecified chronic kidney disease: Secondary | ICD-10-CM | POA: Diagnosis not present

## 2014-07-01 DIAGNOSIS — E1140 Type 2 diabetes mellitus with diabetic neuropathy, unspecified: Secondary | ICD-10-CM | POA: Diagnosis not present

## 2014-07-01 DIAGNOSIS — N183 Chronic kidney disease, stage 3 (moderate): Secondary | ICD-10-CM | POA: Diagnosis not present

## 2014-07-01 DIAGNOSIS — I480 Paroxysmal atrial fibrillation: Secondary | ICD-10-CM | POA: Diagnosis not present

## 2014-07-01 NOTE — Telephone Encounter (Signed)
[name] from [facility] called to renew his orders for once a week for 5 weeks disease process education   [place] [phone]

## 2014-07-01 NOTE — Telephone Encounter (Signed)
New message      Pt received a letter from [place] stating we have not received his remote transmissions.  He saw Dr Mahalia Longest on 06-15-14 and he checked his device.  Please call pt and let him know when to have it checked again.

## 2014-07-01 NOTE — Telephone Encounter (Signed)
Spoke w/ pt wife and informed her that pt received letter b/c of canceled appt w/ MD in March. Pt wife spoke w/ scheduler to schedule an appt.

## 2014-07-02 ENCOUNTER — Telehealth: Payer: Self-pay | Admitting: Internal Medicine

## 2014-07-02 NOTE — Telephone Encounter (Signed)
[place] tell t hem to send   VS weight CHF parameters to his  Cardiology specialist.

## 2014-07-02 NOTE — Telephone Encounter (Signed)
[name] needs a verbal order to continue PT, 2 times per week for 4 additional weeks. Ok to leave on her voicemail if needed.

## 2014-07-04 DIAGNOSIS — I480 Paroxysmal atrial fibrillation: Secondary | ICD-10-CM | POA: Diagnosis not present

## 2014-07-04 DIAGNOSIS — E1140 Type 2 diabetes mellitus with diabetic neuropathy, unspecified: Secondary | ICD-10-CM | POA: Diagnosis not present

## 2014-07-04 DIAGNOSIS — I2510 Atherosclerotic heart disease of native coronary artery without angina pectoris: Secondary | ICD-10-CM | POA: Diagnosis not present

## 2014-07-04 DIAGNOSIS — I5043 Acute on chronic combined systolic (congestive) and diastolic (congestive) heart failure: Secondary | ICD-10-CM | POA: Diagnosis not present

## 2014-07-04 DIAGNOSIS — N183 Chronic kidney disease, stage 3 (moderate): Secondary | ICD-10-CM | POA: Diagnosis not present

## 2014-07-04 DIAGNOSIS — I129 Hypertensive chronic kidney disease with stage 1 through stage 4 chronic kidney disease, or unspecified chronic kidney disease: Secondary | ICD-10-CM | POA: Diagnosis not present

## 2014-07-05 DIAGNOSIS — N183 Chronic kidney disease, stage 3 (moderate): Secondary | ICD-10-CM | POA: Diagnosis not present

## 2014-07-05 DIAGNOSIS — I2510 Atherosclerotic heart disease of native coronary artery without angina pectoris: Secondary | ICD-10-CM | POA: Diagnosis not present

## 2014-07-05 DIAGNOSIS — E1140 Type 2 diabetes mellitus with diabetic neuropathy, unspecified: Secondary | ICD-10-CM | POA: Diagnosis not present

## 2014-07-05 DIAGNOSIS — I5043 Acute on chronic combined systolic (congestive) and diastolic (congestive) heart failure: Secondary | ICD-10-CM | POA: Diagnosis not present

## 2014-07-05 DIAGNOSIS — I129 Hypertensive chronic kidney disease with stage 1 through stage 4 chronic kidney disease, or unspecified chronic kidney disease: Secondary | ICD-10-CM | POA: Diagnosis not present

## 2014-07-05 DIAGNOSIS — I480 Paroxysmal atrial fibrillation: Secondary | ICD-10-CM | POA: Diagnosis not present

## 2014-07-05 NOTE — Telephone Encounter (Signed)
Called Joelene Millin and left message on voicemail, verbal order given to continue PT, 2 times per week for 4 additional weeks okay per Dr. Regis Bill.

## 2014-07-05 NOTE — Telephone Encounter (Signed)
Spoke to Edna from [facility] told her okay to renew his orders for once a week for 5 weeks disease process education and also Dr. Regis Bill would like you to please send VS,weight CHF parameters to pt's Cardiologist. [name] said she has already contacted them. Told her okay.

## 2014-07-07 DIAGNOSIS — E1140 Type 2 diabetes mellitus with diabetic neuropathy, unspecified: Secondary | ICD-10-CM | POA: Diagnosis not present

## 2014-07-07 DIAGNOSIS — I5043 Acute on chronic combined systolic (congestive) and diastolic (congestive) heart failure: Secondary | ICD-10-CM | POA: Diagnosis not present

## 2014-07-07 DIAGNOSIS — I480 Paroxysmal atrial fibrillation: Secondary | ICD-10-CM | POA: Diagnosis not present

## 2014-07-07 DIAGNOSIS — I129 Hypertensive chronic kidney disease with stage 1 through stage 4 chronic kidney disease, or unspecified chronic kidney disease: Secondary | ICD-10-CM | POA: Diagnosis not present

## 2014-07-07 DIAGNOSIS — N183 Chronic kidney disease, stage 3 (moderate): Secondary | ICD-10-CM | POA: Diagnosis not present

## 2014-07-07 DIAGNOSIS — I2510 Atherosclerotic heart disease of native coronary artery without angina pectoris: Secondary | ICD-10-CM | POA: Diagnosis not present

## 2014-07-09 ENCOUNTER — Encounter: Payer: Self-pay | Admitting: Internal Medicine

## 2014-07-09 ENCOUNTER — Ambulatory Visit (INDEPENDENT_AMBULATORY_CARE_PROVIDER_SITE_OTHER): Payer: Medicare Other | Admitting: Internal Medicine

## 2014-07-09 VITALS — BP 112/57 | HR 59 | Ht 73.0 in | Wt 215.0 lb

## 2014-07-09 DIAGNOSIS — I5022 Chronic systolic (congestive) heart failure: Secondary | ICD-10-CM

## 2014-07-09 DIAGNOSIS — I255 Ischemic cardiomyopathy: Secondary | ICD-10-CM | POA: Diagnosis not present

## 2014-07-09 LAB — MDC_IDC_ENUM_SESS_TYPE_INCLINIC
Battery Remaining Longevity: 38.4 mo
Date Time Interrogation Session: 20160422125913
HighPow Impedance: 43 Ohm
Lead Channel Impedance Value: 400 Ohm
Lead Channel Impedance Value: 437.5 Ohm
Lead Channel Pacing Threshold Amplitude: 0.75 V
Lead Channel Pacing Threshold Amplitude: 1.25 V
Lead Channel Pacing Threshold Amplitude: 1.25 V
Lead Channel Pacing Threshold Pulse Width: 0.5 ms
Lead Channel Pacing Threshold Pulse Width: 0.5 ms
Lead Channel Sensing Intrinsic Amplitude: 12 mV
Lead Channel Sensing Intrinsic Amplitude: 2.2 mV
Lead Channel Setting Pacing Amplitude: 2.5 V
Lead Channel Setting Pacing Pulse Width: 1 ms
MDC IDC MSMT LEADCHNL LV PACING THRESHOLD AMPLITUDE: 2.25 V
MDC IDC MSMT LEADCHNL LV PACING THRESHOLD PULSEWIDTH: 1 ms
MDC IDC MSMT LEADCHNL RA PACING THRESHOLD AMPLITUDE: 0.75 V
MDC IDC MSMT LEADCHNL RA PACING THRESHOLD PULSEWIDTH: 0.5 ms
MDC IDC MSMT LEADCHNL RV IMPEDANCE VALUE: 362.5 Ohm
MDC IDC MSMT LEADCHNL RV PACING THRESHOLD PULSEWIDTH: 0.5 ms
MDC IDC PG SERIAL: 7170478
MDC IDC SET LEADCHNL LV PACING AMPLITUDE: 3.25 V
MDC IDC SET LEADCHNL RA PACING AMPLITUDE: 2 V
MDC IDC SET LEADCHNL RV PACING PULSEWIDTH: 0.5 ms
MDC IDC SET LEADCHNL RV SENSING SENSITIVITY: 0.5 mV
MDC IDC STAT BRADY RA PERCENT PACED: 97 %
MDC IDC STAT BRADY RV PERCENT PACED: 99.98 %
Zone Setting Detection Interval: 250 ms
Zone Setting Detection Interval: 300 ms
Zone Setting Detection Interval: 375 ms

## 2014-07-09 NOTE — Progress Notes (Deleted)
Pacemaker check in clinic. Normal device function. Thresholds, sensing, impedances consistent with previous measurements. Device programmed to maximize longevity. No mode switch or high ventricular rates noted. Device programmed at appropriate safety margins. Histogram distribution appropriate for patient activity level. Device programmed to optimize intrinsic conduction---changed paced/sensed AV delay from 275/267ms to 180/173ms. Estimated longevity 3.75-4.42yrs. ROV w/ GT in 57mo.
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
## 2014-07-09 NOTE — Patient Instructions (Signed)
Your physician wants you to follow-up in: [place] will receive a reminder letter in the mail two months in advance. If you don't receive a letter, please call our office to schedule the follow-up appointment.  Remote monitoring is used to monitor your Pacemaker of ICD from home. This monitoring reduces the number of office visits required to check your device to one time per year. It allows [place] to keep an eye on the functioning of your device to ensure it is working properly. You are scheduled for a device check from home on July 21ST. You may send your transmission at any time that day. If you have a wireless device, the transmission will be sent automatically. After your physician reviews your transmission, you will receive a postcard with your next transmission date.

## 2014-07-13 DIAGNOSIS — E1140 Type 2 diabetes mellitus with diabetic neuropathy, unspecified: Secondary | ICD-10-CM | POA: Diagnosis not present

## 2014-07-13 DIAGNOSIS — N183 Chronic kidney disease, stage 3 (moderate): Secondary | ICD-10-CM | POA: Diagnosis not present

## 2014-07-13 DIAGNOSIS — I480 Paroxysmal atrial fibrillation: Secondary | ICD-10-CM | POA: Diagnosis not present

## 2014-07-13 DIAGNOSIS — I5043 Acute on chronic combined systolic (congestive) and diastolic (congestive) heart failure: Secondary | ICD-10-CM | POA: Diagnosis not present

## 2014-07-13 DIAGNOSIS — I2510 Atherosclerotic heart disease of native coronary artery without angina pectoris: Secondary | ICD-10-CM | POA: Diagnosis not present

## 2014-07-13 DIAGNOSIS — I129 Hypertensive chronic kidney disease with stage 1 through stage 4 chronic kidney disease, or unspecified chronic kidney disease: Secondary | ICD-10-CM | POA: Diagnosis not present

## 2014-07-14 ENCOUNTER — Other Ambulatory Visit: Payer: Self-pay | Admitting: Cardiology

## 2014-07-14 ENCOUNTER — Encounter: Payer: Self-pay | Admitting: Internal Medicine

## 2014-07-14 DIAGNOSIS — I5043 Acute on chronic combined systolic (congestive) and diastolic (congestive) heart failure: Secondary | ICD-10-CM | POA: Diagnosis not present

## 2014-07-14 DIAGNOSIS — I2510 Atherosclerotic heart disease of native coronary artery without angina pectoris: Secondary | ICD-10-CM | POA: Diagnosis not present

## 2014-07-14 DIAGNOSIS — N183 Chronic kidney disease, stage 3 (moderate): Secondary | ICD-10-CM | POA: Diagnosis not present

## 2014-07-14 DIAGNOSIS — E1140 Type 2 diabetes mellitus with diabetic neuropathy, unspecified: Secondary | ICD-10-CM | POA: Diagnosis not present

## 2014-07-14 DIAGNOSIS — I480 Paroxysmal atrial fibrillation: Secondary | ICD-10-CM | POA: Diagnosis not present

## 2014-07-14 DIAGNOSIS — I129 Hypertensive chronic kidney disease with stage 1 through stage 4 chronic kidney disease, or unspecified chronic kidney disease: Secondary | ICD-10-CM | POA: Diagnosis not present

## 2014-07-15 ENCOUNTER — Encounter: Payer: Self-pay | Admitting: Cardiology

## 2014-07-15 DIAGNOSIS — I129 Hypertensive chronic kidney disease with stage 1 through stage 4 chronic kidney disease, or unspecified chronic kidney disease: Secondary | ICD-10-CM | POA: Diagnosis not present

## 2014-07-15 DIAGNOSIS — E1140 Type 2 diabetes mellitus with diabetic neuropathy, unspecified: Secondary | ICD-10-CM | POA: Diagnosis not present

## 2014-07-15 DIAGNOSIS — I480 Paroxysmal atrial fibrillation: Secondary | ICD-10-CM | POA: Diagnosis not present

## 2014-07-15 DIAGNOSIS — I5043 Acute on chronic combined systolic (congestive) and diastolic (congestive) heart failure: Secondary | ICD-10-CM | POA: Diagnosis not present

## 2014-07-15 DIAGNOSIS — I2510 Atherosclerotic heart disease of native coronary artery without angina pectoris: Secondary | ICD-10-CM | POA: Diagnosis not present

## 2014-07-15 DIAGNOSIS — N183 Chronic kidney disease, stage 3 (moderate): Secondary | ICD-10-CM | POA: Diagnosis not present

## 2014-07-20 DIAGNOSIS — I129 Hypertensive chronic kidney disease with stage 1 through stage 4 chronic kidney disease, or unspecified chronic kidney disease: Secondary | ICD-10-CM | POA: Diagnosis not present

## 2014-07-20 DIAGNOSIS — E1140 Type 2 diabetes mellitus with diabetic neuropathy, unspecified: Secondary | ICD-10-CM | POA: Diagnosis not present

## 2014-07-20 DIAGNOSIS — I5043 Acute on chronic combined systolic (congestive) and diastolic (congestive) heart failure: Secondary | ICD-10-CM | POA: Diagnosis not present

## 2014-07-20 DIAGNOSIS — N183 Chronic kidney disease, stage 3 (moderate): Secondary | ICD-10-CM | POA: Diagnosis not present

## 2014-07-20 DIAGNOSIS — I480 Paroxysmal atrial fibrillation: Secondary | ICD-10-CM | POA: Diagnosis not present

## 2014-07-20 DIAGNOSIS — I2510 Atherosclerotic heart disease of native coronary artery without angina pectoris: Secondary | ICD-10-CM | POA: Diagnosis not present

## 2014-07-21 ENCOUNTER — Encounter: Payer: Self-pay | Admitting: Internal Medicine

## 2014-07-21 DIAGNOSIS — I5043 Acute on chronic combined systolic (congestive) and diastolic (congestive) heart failure: Secondary | ICD-10-CM | POA: Diagnosis not present

## 2014-07-21 DIAGNOSIS — I480 Paroxysmal atrial fibrillation: Secondary | ICD-10-CM | POA: Diagnosis not present

## 2014-07-21 DIAGNOSIS — I2510 Atherosclerotic heart disease of native coronary artery without angina pectoris: Secondary | ICD-10-CM | POA: Diagnosis not present

## 2014-07-21 DIAGNOSIS — N183 Chronic kidney disease, stage 3 (moderate): Secondary | ICD-10-CM | POA: Diagnosis not present

## 2014-07-21 DIAGNOSIS — I129 Hypertensive chronic kidney disease with stage 1 through stage 4 chronic kidney disease, or unspecified chronic kidney disease: Secondary | ICD-10-CM | POA: Diagnosis not present

## 2014-07-21 DIAGNOSIS — E1140 Type 2 diabetes mellitus with diabetic neuropathy, unspecified: Secondary | ICD-10-CM | POA: Diagnosis not present

## 2014-07-22 DIAGNOSIS — I2510 Atherosclerotic heart disease of native coronary artery without angina pectoris: Secondary | ICD-10-CM | POA: Diagnosis not present

## 2014-07-22 DIAGNOSIS — I480 Paroxysmal atrial fibrillation: Secondary | ICD-10-CM | POA: Diagnosis not present

## 2014-07-22 DIAGNOSIS — N183 Chronic kidney disease, stage 3 (moderate): Secondary | ICD-10-CM | POA: Diagnosis not present

## 2014-07-22 DIAGNOSIS — I5043 Acute on chronic combined systolic (congestive) and diastolic (congestive) heart failure: Secondary | ICD-10-CM | POA: Diagnosis not present

## 2014-07-22 DIAGNOSIS — I129 Hypertensive chronic kidney disease with stage 1 through stage 4 chronic kidney disease, or unspecified chronic kidney disease: Secondary | ICD-10-CM | POA: Diagnosis not present

## 2014-07-22 DIAGNOSIS — E1140 Type 2 diabetes mellitus with diabetic neuropathy, unspecified: Secondary | ICD-10-CM | POA: Diagnosis not present

## 2014-07-23 DIAGNOSIS — I5043 Acute on chronic combined systolic (congestive) and diastolic (congestive) heart failure: Secondary | ICD-10-CM | POA: Diagnosis not present

## 2014-07-27 ENCOUNTER — Encounter: Payer: Self-pay | Admitting: Internal Medicine

## 2014-07-27 ENCOUNTER — Ambulatory Visit (INDEPENDENT_AMBULATORY_CARE_PROVIDER_SITE_OTHER): Payer: Medicare Other | Admitting: Internal Medicine

## 2014-07-27 VITALS — BP 126/54 | Temp 97.8°F | Ht 73.0 in | Wt 212.1 lb

## 2014-07-27 DIAGNOSIS — E1140 Type 2 diabetes mellitus with diabetic neuropathy, unspecified: Secondary | ICD-10-CM | POA: Diagnosis not present

## 2014-07-27 DIAGNOSIS — I255 Ischemic cardiomyopathy: Secondary | ICD-10-CM

## 2014-07-27 DIAGNOSIS — E119 Type 2 diabetes mellitus without complications: Secondary | ICD-10-CM

## 2014-07-27 DIAGNOSIS — Z789 Other specified health status: Secondary | ICD-10-CM

## 2014-07-27 DIAGNOSIS — M5431 Sciatica, right side: Secondary | ICD-10-CM | POA: Diagnosis not present

## 2014-07-27 DIAGNOSIS — N183 Chronic kidney disease, stage 3 (moderate): Secondary | ICD-10-CM | POA: Diagnosis not present

## 2014-07-27 DIAGNOSIS — I129 Hypertensive chronic kidney disease with stage 1 through stage 4 chronic kidney disease, or unspecified chronic kidney disease: Secondary | ICD-10-CM | POA: Diagnosis not present

## 2014-07-27 DIAGNOSIS — Z87898 Personal history of other specified conditions: Secondary | ICD-10-CM | POA: Diagnosis not present

## 2014-07-27 DIAGNOSIS — I2510 Atherosclerotic heart disease of native coronary artery without angina pectoris: Secondary | ICD-10-CM | POA: Diagnosis not present

## 2014-07-27 DIAGNOSIS — I480 Paroxysmal atrial fibrillation: Secondary | ICD-10-CM | POA: Diagnosis not present

## 2014-07-27 DIAGNOSIS — I5043 Acute on chronic combined systolic (congestive) and diastolic (congestive) heart failure: Secondary | ICD-10-CM | POA: Diagnosis not present

## 2014-07-27 MED ORDER — GABAPENTIN 100 MG PO CAPS: 100.0000 mg | ORAL_CAPSULE | Freq: Every day | ORAL | Status: DC

## 2014-07-27 MED ORDER — PREDNISONE 10 MG PO TABS: ORAL_TABLET | ORAL | Status: DC
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
## 2014-07-27 NOTE — Patient Instructions (Addendum)
Taking prednisone which can decrease inflammation around the nerver but can increase the blood sugar temporarily .until going off the medication. Please monitor your blood sugar and if increasing over 200 add 2 units for that day .   Also  Begin trial of gabapentin for nerve pain  When first start can cause fogginess   We can increase the dose slowy to help the pain as tolerated . Caution when you first start .  100 - 300 mg at night and then increase to 300 mg twice a day  And then 300 mg 2 x per day  Slowly if needed    Sciatica Sciatica is pain, weakness, numbness, or tingling along the path of the sciatic nerve. The nerve starts in the lower back and runs down the back of each leg. The nerve controls the muscles in the lower leg and in the back of the knee, while also providing sensation to the back of the thigh, lower leg, and the sole of your foot. Sciatica is a symptom of another medical condition. For instance, nerve damage or certain conditions, such as a herniated disk or bone spur on the spine, pinch or put pressure on the sciatic nerve. This causes the pain, weakness, or other sensations normally associated with sciatica. Generally, sciatica only affects one side of the body. CAUSES   Herniated or slipped disc.  Degenerative disk disease.  A pain disorder involving the narrow muscle in the buttocks (piriformis syndrome).  Pelvic injury or fracture.  Pregnancy.  Tumor (rare). SYMPTOMS  Symptoms can vary from mild to very severe. The symptoms usually travel from the low back to the buttocks and down the back of the leg. Symptoms can include:  Mild tingling or dull aches in the lower back, leg, or hip.  Numbness in the back of the calf or sole of the foot.  Burning sensations in the lower back, leg, or hip.  Sharp pains in the lower back, leg, or hip.  Leg weakness.  Severe back pain inhibiting movement. These symptoms may get worse with coughing, sneezing, laughing, or  prolonged sitting or standing. Also, being overweight may worsen symptoms. DIAGNOSIS  Your caregiver will perform a physical exam to look for common symptoms of sciatica. He or she may ask you to do certain movements or activities that would trigger sciatic nerve pain. Other tests may be performed to find the cause of the sciatica. These may include:  Blood tests.  X-rays.  Imaging tests, such as an MRI or CT scan. TREATMENT  Treatment is directed at the cause of the sciatic pain. Sometimes, treatment is not necessary and the pain and discomfort goes away on its own. If treatment is needed, your caregiver may suggest:  Over-the-counter medicines to relieve pain.  Prescription medicines, such as anti-inflammatory medicine, muscle relaxants, or narcotics.  Applying heat or ice to the painful area.  Steroid injections to lessen pain, irritation, and inflammation around the nerve.  Reducing activity during periods of pain.  Exercising and stretching to strengthen your abdomen and improve flexibility of your spine. Your caregiver may suggest losing weight if the extra weight makes the back pain worse.  Physical therapy.  Surgery to eliminate what is pressing or pinching the nerve, such as a bone spur or part of a herniated disk. HOME CARE INSTRUCTIONS   Only take over-the-counter or prescription medicines for pain or discomfort as directed by your caregiver.  Apply ice to the affected area for 20 minutes, 3-4 times a  day for the first 48-72 hours. Then try heat in the same way.  Exercise, stretch, or perform your usual activities if these do not aggravate your pain.  Attend physical therapy sessions as directed by your caregiver.  Keep all follow-up appointments as directed by your caregiver.  Do not wear high heels or shoes that do not provide proper support.  Check your mattress to see if it is too soft. A firm mattress may lessen your pain and discomfort. SEEK IMMEDIATE  MEDICAL CARE IF:   You lose control of your bowel or bladder (incontinence).  You have increasing weakness in the lower back, pelvis, buttocks, or legs.  You have redness or swelling of your back.  You have a burning sensation when you urinate.  You have pain that gets worse when you lie down or awakens you at night.  Your pain is worse than you have experienced in the past.  Your pain is lasting longer than 4 weeks.  You are suddenly losing weight without reason. MAKE SURE YOU:  Understand these instructions.  Will watch your condition.  Will get help right away if you are not doing well or get worse. Document Released: 02/27/2001 Document Revised: 09/04/2011 Document Reviewed: 07/15/2011 [facility] Patient Information 2015 [place], [place]. This information is not intended to replace advice given to you by your health care provider. Make sure you discuss any questions you have with your health care provider.

## 2014-07-28 ENCOUNTER — Encounter (HOSPITAL_COMMUNITY): Payer: Self-pay

## 2014-07-28 ENCOUNTER — Ambulatory Visit (HOSPITAL_COMMUNITY)
Admission: RE | Admit: 2014-07-28 | Discharge: 2014-07-28 | Disposition: A | Discharge status: Home / Self Care | Payer: Medicare Other | Source: Ambulatory Visit | Attending: Cardiology | Admitting: Cardiology

## 2014-07-28 VITALS — BP 110/58 | HR 60 | Wt 211.5 lb

## 2014-07-28 DIAGNOSIS — I4891 Unspecified atrial fibrillation: Secondary | ICD-10-CM | POA: Insufficient documentation

## 2014-07-28 DIAGNOSIS — I255 Ischemic cardiomyopathy: Secondary | ICD-10-CM | POA: Insufficient documentation

## 2014-07-28 DIAGNOSIS — I5022 Chronic systolic (congestive) heart failure: Secondary | ICD-10-CM | POA: Insufficient documentation

## 2014-07-28 DIAGNOSIS — N183 Chronic kidney disease, stage 3 unspecified: Secondary | ICD-10-CM

## 2014-07-28 DIAGNOSIS — Z7902 Long term (current) use of antithrombotics/antiplatelets: Secondary | ICD-10-CM | POA: Insufficient documentation

## 2014-07-28 DIAGNOSIS — I480 Paroxysmal atrial fibrillation: Secondary | ICD-10-CM | POA: Diagnosis not present

## 2014-07-28 DIAGNOSIS — G2581 Restless legs syndrome: Secondary | ICD-10-CM | POA: Diagnosis not present

## 2014-07-28 DIAGNOSIS — Z79899 Other long term (current) drug therapy: Secondary | ICD-10-CM | POA: Diagnosis not present

## 2014-07-28 DIAGNOSIS — E119 Type 2 diabetes mellitus without complications: Secondary | ICD-10-CM | POA: Insufficient documentation

## 2014-07-28 DIAGNOSIS — Z87891 Personal history of nicotine dependence: Secondary | ICD-10-CM | POA: Insufficient documentation

## 2014-07-28 DIAGNOSIS — Z951 Presence of aortocoronary bypass graft: Secondary | ICD-10-CM | POA: Insufficient documentation

## 2014-07-28 DIAGNOSIS — M5430 Sciatica, unspecified side: Secondary | ICD-10-CM | POA: Diagnosis not present

## 2014-07-28 DIAGNOSIS — I129 Hypertensive chronic kidney disease with stage 1 through stage 4 chronic kidney disease, or unspecified chronic kidney disease: Secondary | ICD-10-CM | POA: Insufficient documentation

## 2014-07-28 DIAGNOSIS — Z8249 Family history of ischemic heart disease and other diseases of the circulatory system: Secondary | ICD-10-CM | POA: Insufficient documentation

## 2014-07-28 DIAGNOSIS — E785 Hyperlipidemia, unspecified: Secondary | ICD-10-CM | POA: Diagnosis not present

## 2014-07-28 DIAGNOSIS — I6521 Occlusion and stenosis of right carotid artery: Secondary | ICD-10-CM | POA: Diagnosis not present

## 2014-07-28 DIAGNOSIS — N189 Chronic kidney disease, unspecified: Secondary | ICD-10-CM | POA: Insufficient documentation

## 2014-07-28 DIAGNOSIS — I2510 Atherosclerotic heart disease of native coronary artery without angina pectoris: Secondary | ICD-10-CM | POA: Diagnosis not present

## 2014-07-28 NOTE — Patient Instructions (Signed)
Follow up 2 months.  Do the following things EVERYDAY: 1) Weigh yourself in the morning before breakfast. Write it down and keep it in a log. 2) Take your medicines as prescribed 3) Eat low salt foods-Limit salt (sodium) to 2000 mg per day.  4) Stay as active as you can everyday 5) Limit all fluids for the day to less than 2 liters

## 2014-07-28 NOTE — Progress Notes (Signed)
Patient ID: [name], male   DOB: 01-04-1929, 81 y.o.   MRN: [id number] Patient ID: [name], male   DOB: [birth year], 81 y.o.   MRN: [id number] PCP: Dr. Regis Bill  81 yo with history of CAD s/p CABG, ischemic cardiomyopathy, and paroxysmal atrial fibrillation presents for followup of recent hospital admission.  Patient was admitted in 2/16 with CHF and diuresed, he was sent home on Lasix 60 mg bid.  He missed 3 days of the pm Lasix dose and was re-admitted on 05/18/14 with acute on chronic systolic CHF with dyspnea and hypoxemia. He was diuresed with Lasix gtt and metolazone.  While in the hospital, he went into atrial fibrillation with RVR requiring cardioversion.  Creatinine was elevated and ramipril was stopped.  We had to cut back on Coreg due to hypotension and low output.  He was begun on low dose digoxin given CKD.    He returns for follow up today.  Evaluated by PCP yesterday and started on prednisone and gabepentin for sciatica. Weight at home 206-210 pounds.  Denies SOB/PND.  Good appetite. Followed by [facility] for [facility] and HHPT. Taking all medications.   Labs (3/16): K 4.2, creatinine 2.26, digoxin < 0.2 Labs (05/31/2014): TSH 2.3 Dig level 0.6  Labs 06/14/2014: K 4.5 Creatinine 2.14   PMH: 1. CAD: S/p CABG 2005 with LIMA-Diagonal, SVG-LAD, SVG-LCx, SVG-RCA.  Cardiolite in 2/15 with EF 19%, no ischemia.  2. Ischemic cardiomyopathy: Echo (2/16) with EF 20-25%, severe LV dilation with RWMAs, moderate AI, moderate MR, moderately decreased RV systolic function.  St Jude CRT-D device.  3. Atrial fibrillation: Paroxysmal.  DCCV 3/16.  4. CKD 5. Carotid stenosis: Carotid dopplers (7/15) with 60-79% RICA stenosis.  6. HTN 7. Hyperlipidemia 8. Restless leg syndrome. 9. Type 2 diabetes. 10. OA 11. Depression 12. H/o CCY 26. H/o appy  SH: Married, quit smoking 1980, [place] [employer] vet  FH: CAD  ROS: All systems reviewed and negative except as per HPI.   Current Outpatient  Prescriptions  Medication Sig Dispense Refill  . acetaminophen (TYLENOL) 500 MG tablet Take 500 mg by mouth daily as needed for mild pain.    [name] amiodarone (PACERONE) 200 MG tablet Take 2 tablets PO daily for 10 days, then 1 tablet daily (Patient taking differently: Take 200 mg by mouth daily. ) 60 tablet 3  . apixaban (ELIQUIS) 2.5 MG TABS tablet Take 1 tablet (2.5 mg total) by mouth 2 (two) times daily. 180 tablet 3  . atorvastatin (LIPITOR) 40 MG tablet Take 1 tablet (40 mg total) by mouth daily. 90 tablet 3  . carvedilol (COREG) 6.25 MG tablet Take 1 tablet (6.25 mg total) by mouth 2 (two) times daily with a meal. 60 tablet 3  . DIGOX 125 MCG tablet TAKE 1/2 TABLET(0.0625MG ) BY MOUTH EVERY DAY 30 tablet 2  . escitalopram (LEXAPRO) 10 MG tablet Take 1 tablet (10 mg total) by mouth daily. 90 tablet 1  . gabapentin (NEURONTIN) 100 MG capsule Take 1-3 capsules (100-300 mg total) by mouth at bedtime. Can increase to 300mg   Bid 90 capsule 1  . glipiZIDE (GLUCOTROL) 10 MG tablet Take 1 tablet (10 mg total) by mouth daily before breakfast. 180 tablet 1  . hydrALAZINE (APRESOLINE) 25 MG tablet Take 1 tablet (25 mg total) by mouth every 8 (eight) hours. 90 tablet 3  . Insulin Detemir (LEVEMIR) 100 UNIT/ML Pen Inject 18 Units into the skin at bedtime. as directed (Patient taking differently: Inject 10 Units into  the skin at bedtime. ) 2 pen 0  . isosorbide mononitrate (IMDUR) 30 MG 24 hr tablet Take 1 tablet (30 mg total) by mouth daily. 30 tablet 0  . polyethylene glycol (MIRALAX) packet Take 17 g by mouth daily. 14 each 0  . potassium chloride SA (K-DUR,KLOR-CON) 20 MEQ tablet Take 2 tablets (40 mEq total) by mouth daily. 60 tablet 3  . pramipexole (MIRAPEX) 0.5 MG tablet Take 2-3 hours before sleep for restless leg. (Patient taking differently: Take 0.5 mg by mouth every evening. Take 2-3 hours before sleep for restless leg.) 90 tablet 2  . predniSONE (DELTASONE) 10 MG tablet Take pills per  day,6,6,4,4,4,2,2,2,1,1,1 ,1/2,1/2 40 tablet 0  . saxagliptin HCl (ONGLYZA) 5 MG TABS tablet Take 1 tablet (5 mg total) by mouth daily. 90 tablet 1  . silodosin (RAPAFLO) 8 MG CAPS capsule Take 1 capsule (8 mg total) by mouth daily with breakfast. 90 capsule 1  . spironolactone (ALDACTONE) 25 MG tablet Take 0.5 tablets (12.5 mg total) by mouth daily. 15 tablet 3  . torsemide (DEMADEX) 20 MG tablet Take 3 tablets (60 mg total) by mouth daily. May take additional 20 mg for weight greater than 210 lbs. 120 tablet 3   No current facility-administered medications for this encounter.   BP 110/58 mmHg  Pulse 60  Wt 211 lb 8 oz (95.936 kg)  SpO2 95% General: NAD Wife present.  Neck: No JVD, no thyromegaly or thyroid nodule.  Lungs: Clear to auscultation bilaterally with normal respiratory effort. CV: Nondisplaced PMI.  Heart regular S1/S2, no S3/S4, 2/6 early SEM.  No edema. No carotid bruit.  Normal pedal pulses.  Abdomen: Soft, nontender, no hepatosplenomegaly, mild to modeate distention.  Skin: Intact without lesions or rashes.  Neurologic: Alert and oriented x 3.  Psych: Normal affect. Extremities: No clubbing or cyanosis.  HEENT: Normal.   Assessment/Plan: 1. Chronic systolic CHF: Ischemic cardiomyopathy.  EF 20-25% by 2/16 echo with moderate RV dysfunction.  - Volume status stable. Continue torsemide 60 mg daily and may take an extra 20 mg torsemide for weight 210 pounds or greater. I have stressed that he may need to take an extra torsemide while on prednisone.    - Continue hydralazine/Imdur at current dosing.   - Will leave off ACEI for now with recent rise in creatinine.  - Continue current spironolactone. - Continue current digoxin.  Dig level 0.6 on March 14th.   - HR 60 so continue 6.25 mg coreg twice a day. .  2. Atrial fibrillation: Paroxysmal.  He is a-paced today.  Continue amiodarone and Eliquis (dosed at 2.5 mg bid with age and renal dysfunction).  Will need to make sure to  have regular eye exam with amiodarone.  3. CKD: Will remain off ACEI for now.  4. CAD: No chest pain, stable.  Continue on statin.  He is not on ASA 81 given stable CAD and Eliquis use.  5. Carotid stenosis: Repeat carotid dopplers 7/16.  6. Sciatica- Started on neurontin + prednisone today by PCP  Follow up in 6 weeks.   Chrisy Hillebrand NP-C  07/28/2014

## 2014-07-29 DIAGNOSIS — N183 Chronic kidney disease, stage 3 (moderate): Secondary | ICD-10-CM | POA: Diagnosis not present

## 2014-07-29 DIAGNOSIS — I480 Paroxysmal atrial fibrillation: Secondary | ICD-10-CM | POA: Diagnosis not present

## 2014-07-29 DIAGNOSIS — I5043 Acute on chronic combined systolic (congestive) and diastolic (congestive) heart failure: Secondary | ICD-10-CM | POA: Diagnosis not present

## 2014-07-29 DIAGNOSIS — I129 Hypertensive chronic kidney disease with stage 1 through stage 4 chronic kidney disease, or unspecified chronic kidney disease: Secondary | ICD-10-CM | POA: Diagnosis not present

## 2014-07-29 DIAGNOSIS — E1140 Type 2 diabetes mellitus with diabetic neuropathy, unspecified: Secondary | ICD-10-CM | POA: Diagnosis not present

## 2014-07-29 DIAGNOSIS — I2510 Atherosclerotic heart disease of native coronary artery without angina pectoris: Secondary | ICD-10-CM | POA: Diagnosis not present

## 2014-07-30 ENCOUNTER — Telehealth: Payer: Self-pay | Admitting: Internal Medicine

## 2014-07-30 NOTE — Telephone Encounter (Signed)
[name] would like to extend pt  home health nurse service until 09-01-14. Please call with verbal ok

## 2014-07-30 NOTE — Telephone Encounter (Signed)
Spoke to [place] and advised that she proceed with home health services.  She did report that the patient's blood glucose is elevated but he is using his sliding scale to adjust his insulin as needed.

## 2014-07-30 NOTE — Telephone Encounter (Signed)
Ok

## 2014-08-03 ENCOUNTER — Telehealth: Payer: Self-pay | Admitting: Internal Medicine

## 2014-08-03 DIAGNOSIS — N183 Chronic kidney disease, stage 3 (moderate): Secondary | ICD-10-CM | POA: Diagnosis not present

## 2014-08-03 DIAGNOSIS — I5043 Acute on chronic combined systolic (congestive) and diastolic (congestive) heart failure: Secondary | ICD-10-CM | POA: Diagnosis not present

## 2014-08-03 DIAGNOSIS — E1140 Type 2 diabetes mellitus with diabetic neuropathy, unspecified: Secondary | ICD-10-CM | POA: Diagnosis not present

## 2014-08-03 DIAGNOSIS — I480 Paroxysmal atrial fibrillation: Secondary | ICD-10-CM | POA: Diagnosis not present

## 2014-08-03 DIAGNOSIS — I129 Hypertensive chronic kidney disease with stage 1 through stage 4 chronic kidney disease, or unspecified chronic kidney disease: Secondary | ICD-10-CM | POA: Diagnosis not present

## 2014-08-03 DIAGNOSIS — I2510 Atherosclerotic heart disease of native coronary artery without angina pectoris: Secondary | ICD-10-CM | POA: Diagnosis not present

## 2014-08-03 NOTE — Telephone Encounter (Signed)
[name] needs order to treat skin tear 10 x10 cm on left elbow pt had fall. CIindy would like to use xeroform dressing and wound cleaner at least twice a wk. Please call with verbal order

## 2014-08-03 NOTE — Telephone Encounter (Signed)
Ok to do this 

## 2014-08-04 ENCOUNTER — Other Ambulatory Visit: Payer: Self-pay | Admitting: Internal Medicine

## 2014-08-05 NOTE — Telephone Encounter (Signed)
LM for [name] to return my call.

## 2014-08-05 NOTE — Telephone Encounter (Signed)
Spoke to [place] and informed her to proceed with the Xeroform dressing and wound cleanser.

## 2014-08-06 ENCOUNTER — Telehealth: Payer: Self-pay | Admitting: Internal Medicine

## 2014-08-06 DIAGNOSIS — N183 Chronic kidney disease, stage 3 (moderate): Secondary | ICD-10-CM | POA: Diagnosis not present

## 2014-08-06 DIAGNOSIS — I129 Hypertensive chronic kidney disease with stage 1 through stage 4 chronic kidney disease, or unspecified chronic kidney disease: Secondary | ICD-10-CM | POA: Diagnosis not present

## 2014-08-06 DIAGNOSIS — E1140 Type 2 diabetes mellitus with diabetic neuropathy, unspecified: Secondary | ICD-10-CM | POA: Diagnosis not present

## 2014-08-06 DIAGNOSIS — I2510 Atherosclerotic heart disease of native coronary artery without angina pectoris: Secondary | ICD-10-CM | POA: Diagnosis not present

## 2014-08-06 DIAGNOSIS — I5043 Acute on chronic combined systolic (congestive) and diastolic (congestive) heart failure: Secondary | ICD-10-CM | POA: Diagnosis not present

## 2014-08-06 DIAGNOSIS — I480 Paroxysmal atrial fibrillation: Secondary | ICD-10-CM | POA: Diagnosis not present

## 2014-08-06 MED ORDER — SILODOSIN 8 MG PO CAPS: 8.0000 mg | ORAL_CAPSULE | Freq: Every day | ORAL | Status: DC

## 2014-08-06 NOTE — Telephone Encounter (Signed)
Pt request refill of the following: silodosin (RAPAFLO) 8 MG CAPS capsule   Phamacy:  [facility]

## 2014-08-06 NOTE — Telephone Encounter (Signed)
Sent to the pharmacy by e-scribe. 

## 2014-08-09 ENCOUNTER — Other Ambulatory Visit: Payer: Self-pay | Admitting: Family Medicine

## 2014-08-09 DIAGNOSIS — I480 Paroxysmal atrial fibrillation: Secondary | ICD-10-CM | POA: Diagnosis not present

## 2014-08-09 DIAGNOSIS — I2510 Atherosclerotic heart disease of native coronary artery without angina pectoris: Secondary | ICD-10-CM | POA: Diagnosis not present

## 2014-08-09 DIAGNOSIS — E1140 Type 2 diabetes mellitus with diabetic neuropathy, unspecified: Secondary | ICD-10-CM | POA: Diagnosis not present

## 2014-08-09 DIAGNOSIS — N183 Chronic kidney disease, stage 3 (moderate): Secondary | ICD-10-CM | POA: Diagnosis not present

## 2014-08-09 DIAGNOSIS — I129 Hypertensive chronic kidney disease with stage 1 through stage 4 chronic kidney disease, or unspecified chronic kidney disease: Secondary | ICD-10-CM | POA: Diagnosis not present

## 2014-08-09 DIAGNOSIS — I5043 Acute on chronic combined systolic (congestive) and diastolic (congestive) heart failure: Secondary | ICD-10-CM | POA: Diagnosis not present

## 2014-08-10 ENCOUNTER — Telehealth (HOSPITAL_COMMUNITY): Payer: Self-pay | Admitting: *Deleted

## 2014-08-10 MED ORDER — SILODOSIN 8 MG PO CAPS: 8.0000 mg | ORAL_CAPSULE | Freq: Every day | ORAL | Status: DC

## 2014-08-10 MED ORDER — ESCITALOPRAM OXALATE 10 MG PO TABS: 10.0000 mg | ORAL_TABLET | Freq: Every day | ORAL | Status: DC

## 2014-08-10 MED ORDER — SAXAGLIPTIN HCL 5 MG PO TABS: 5.0000 mg | ORAL_TABLET | Freq: Every day | ORAL | Status: DC

## 2014-08-10 MED ORDER — GLIPIZIDE 10 MG PO TABS: 10.0000 mg | ORAL_TABLET | Freq: Every day | ORAL | Status: DC

## 2014-08-10 MED ORDER — PRAMIPEXOLE DIHYDROCHLORIDE 0.5 MG PO TABS: ORAL_TABLET | ORAL | Status: DC

## 2014-08-10 NOTE — Telephone Encounter (Signed)
Cindy HHRN with [facility] called to report pt's BP has been a little low and pt is orthostatic.  She states BP running 100s/50s, 102/58 yesterday but when he stands he drops to 86/60, 84/60 and begins to "sway from side to side" she reports pt actually fell last week and has a skin tear from that.  Pt's wt is down some on 5/3 he 207 lb, past few days wt has been 203-204.  She states abd girth is smaller and pt is not as SOB, he is c/o feeling dizzy.  She reports pt was started on Neurontin 100 mg at bedtime and a prednisone taper on 5/10 which is the time this all started, he did complete the prednisone taper yesterday.  She will fax VS readings and she is sch to see him again on Fri, will send to MD for review and call her back

## 2014-08-10 NOTE — Telephone Encounter (Signed)
Sent to the pharmacy by e-scribe. 

## 2014-08-10 NOTE — Telephone Encounter (Signed)
Cut hydralazine to 12.5 mg tid and Imdur to 15 daily.

## 2014-08-11 ENCOUNTER — Telehealth: Payer: Self-pay | Admitting: Internal Medicine

## 2014-08-11 NOTE — Telephone Encounter (Signed)
Nurse would like to try on Versitel and Optifoam Gentle his skin tear when she can get the supplies in. Recommended by would care RN.

## 2014-08-12 DIAGNOSIS — I5043 Acute on chronic combined systolic (congestive) and diastolic (congestive) heart failure: Secondary | ICD-10-CM | POA: Diagnosis not present

## 2014-08-12 DIAGNOSIS — I129 Hypertensive chronic kidney disease with stage 1 through stage 4 chronic kidney disease, or unspecified chronic kidney disease: Secondary | ICD-10-CM | POA: Diagnosis not present

## 2014-08-12 DIAGNOSIS — N183 Chronic kidney disease, stage 3 (moderate): Secondary | ICD-10-CM | POA: Diagnosis not present

## 2014-08-12 DIAGNOSIS — I480 Paroxysmal atrial fibrillation: Secondary | ICD-10-CM | POA: Diagnosis not present

## 2014-08-12 DIAGNOSIS — I2510 Atherosclerotic heart disease of native coronary artery without angina pectoris: Secondary | ICD-10-CM | POA: Diagnosis not present

## 2014-08-12 DIAGNOSIS — E1140 Type 2 diabetes mellitus with diabetic neuropathy, unspecified: Secondary | ICD-10-CM | POA: Diagnosis not present

## 2014-08-12 MED ORDER — ISOSORBIDE MONONITRATE ER 30 MG PO TB24: 15.0000 mg | ORAL_TABLET | Freq: Every day | ORAL | Status: DC

## 2014-08-12 MED ORDER — HYDRALAZINE HCL 25 MG PO TABS: 12.5000 mg | ORAL_TABLET | Freq: Three times a day (TID) | ORAL | Status: DC

## 2014-08-12 NOTE — Telephone Encounter (Signed)
Left Cindy a detailed VM regarding med changes as she is seeing pt tomorrow

## 2014-08-12 NOTE — Telephone Encounter (Signed)
Cindy from [facility] called back and states she is on the way to pt's home and wants to know if she can try the Versitel on pt today.  [name] states pt fell last week and pt's wife cut off the flap before they could reach the patient.  She states the wound is about 8x10 on pt's arm.  The plan is to leave there Versitel on pt's arm for 1 week to allow it to heal and then change the outer dressing.  Ok per Dr. Raliegh Ip to do this. Verbal given to [facility] and she is aware.

## 2014-08-14 NOTE — Telephone Encounter (Signed)
Noted  I was out of office when this message came in

## 2014-08-16 DIAGNOSIS — I129 Hypertensive chronic kidney disease with stage 1 through stage 4 chronic kidney disease, or unspecified chronic kidney disease: Secondary | ICD-10-CM | POA: Diagnosis not present

## 2014-08-16 DIAGNOSIS — N183 Chronic kidney disease, stage 3 (moderate): Secondary | ICD-10-CM | POA: Diagnosis not present

## 2014-08-16 DIAGNOSIS — I5043 Acute on chronic combined systolic (congestive) and diastolic (congestive) heart failure: Secondary | ICD-10-CM | POA: Diagnosis not present

## 2014-08-16 DIAGNOSIS — I480 Paroxysmal atrial fibrillation: Secondary | ICD-10-CM | POA: Diagnosis not present

## 2014-08-16 DIAGNOSIS — E1140 Type 2 diabetes mellitus with diabetic neuropathy, unspecified: Secondary | ICD-10-CM | POA: Diagnosis not present

## 2014-08-16 DIAGNOSIS — I2510 Atherosclerotic heart disease of native coronary artery without angina pectoris: Secondary | ICD-10-CM | POA: Diagnosis not present

## 2014-08-17 ENCOUNTER — Other Ambulatory Visit (HOSPITAL_COMMUNITY): Payer: Self-pay | Admitting: Internal Medicine

## 2014-08-17 ENCOUNTER — Telehealth: Payer: Self-pay

## 2014-08-17 NOTE — Telephone Encounter (Signed)
PLEASE NOTE: All timestamps contained within this report are represented as [place] Standard Time. CONFIDENTIALTY NOTICE: This fax transmission is intended only for the addressee. It contains information that is legally privileged, confidential or otherwise protected from use or disclosure. If you are not the intended recipient, you are strictly prohibited from reviewing, disclosing, copying using or disseminating any of this information or taking any action in reliance on or regarding this information. If you have received this fax in error, please notify us immediately by telephone so that we can arrange for its return to [place]. Phone: [phone], Toll-Free: [phone], Fax: [phone] Page: 1 of 1 Call Id: [id number] [facility] Night - Client [place] Patient Name: [name] Gender: Male DOB: 07-05-1928 Age: 81 Y 2 M 20 D Return Phone Number: [phone] (Primary), [phone] (Secondary) Address: [address] City/State/Zip: [place] [address] Client [facility] [place] Night - Client Client [facility] [place] - Night Physician Shanon Ace Contact Type Call Call Type Page Only Caller Name [place] w/ [place] Relationship To Patient Provider Is this call to report lab results? No Return Phone Number [phone] (Primary) Initial Comment Caller States, the patient is experiencing chest pain and wheezing Nurse Assessment Guidelines Guideline Title Affirmed Question Affirmed Notes Nurse Date/Time (Eastern Time) Disp. Time Eilene Ghazi Time) Disposition Final User 08/16/2014 12:17:44 PM Send to [place] 08/16/2014 12:38:30 PM Paged On Call to Other Provider [name], Amy 08/16/2014 12:39:24 PM Page Completed Yes [name], Amy After Care Instructions Given Call Event Type User Date / Time Description Paging DoctorName Phone DateTime Result/Outcome  Message Type Notes Gwendolyn Grant [phone] 08/16/2014 12:38:30 PM Paged On Call to Other Provider Doctor Paged Please call Cindy w/ [place] at [phone]. Gwendolyn Grant 08/16/2014 12:39:06 PM Paged On Call to Another Provider Message Result

## 2014-08-17 NOTE — Telephone Encounter (Signed)
LMOM for [name] to return my call.

## 2014-08-18 ENCOUNTER — Telehealth (HOSPITAL_COMMUNITY): Payer: Self-pay | Admitting: *Deleted

## 2014-08-18 DIAGNOSIS — I2510 Atherosclerotic heart disease of native coronary artery without angina pectoris: Secondary | ICD-10-CM | POA: Diagnosis not present

## 2014-08-18 DIAGNOSIS — N183 Chronic kidney disease, stage 3 (moderate): Secondary | ICD-10-CM | POA: Diagnosis not present

## 2014-08-18 DIAGNOSIS — I129 Hypertensive chronic kidney disease with stage 1 through stage 4 chronic kidney disease, or unspecified chronic kidney disease: Secondary | ICD-10-CM | POA: Diagnosis not present

## 2014-08-18 DIAGNOSIS — L570 Actinic keratosis: Secondary | ICD-10-CM | POA: Diagnosis not present

## 2014-08-18 DIAGNOSIS — I5043 Acute on chronic combined systolic (congestive) and diastolic (congestive) heart failure: Secondary | ICD-10-CM | POA: Diagnosis not present

## 2014-08-18 DIAGNOSIS — I480 Paroxysmal atrial fibrillation: Secondary | ICD-10-CM | POA: Diagnosis not present

## 2014-08-18 DIAGNOSIS — E1140 Type 2 diabetes mellitus with diabetic neuropathy, unspecified: Secondary | ICD-10-CM | POA: Diagnosis not present

## 2014-08-18 NOTE — Telephone Encounter (Signed)
Cindy with [facility] saw pt Monday and he was wheezing she thought it might be allergies. Contacted his PCP and was prescribed combivent inhaler. [name] saw pt today and said wheezing was worse he is more SOB, increased cough when lying down. Please advise

## 2014-08-18 NOTE — Telephone Encounter (Signed)
Spoke w/pt's wife she is aware and verbalizes understanding, will contact [place] tomorrow regarding labs next week, pt's wife states [name] is coming back out on Tue 6/7

## 2014-08-18 NOTE — Telephone Encounter (Signed)
Increase torsemide to 80 mg daily, BMET/BNP next week (appt versus nurse check)

## 2014-08-18 NOTE — Telephone Encounter (Signed)
Spoke to [place].  Pt was taken care of by Dr. Asa Lente.  She started him on a new inhaler and cardiology has adjusted some of his medications.  [name] did not go into detail on the changes.  Should have been updated in Epic.  [name] spoke to Mrs.  Llerenas yesterday and the pt is doing much better.

## 2014-08-19 ENCOUNTER — Ambulatory Visit (INDEPENDENT_AMBULATORY_CARE_PROVIDER_SITE_OTHER)
Admission: RE | Admit: 2014-08-19 | Discharge: 2014-08-19 | Disposition: A | Discharge status: Home / Self Care | Payer: Medicare Other | Source: Ambulatory Visit | Attending: Adult Health | Admitting: Adult Health

## 2014-08-19 ENCOUNTER — Ambulatory Visit (INDEPENDENT_AMBULATORY_CARE_PROVIDER_SITE_OTHER): Payer: Medicare Other | Admitting: Adult Health

## 2014-08-19 ENCOUNTER — Encounter: Payer: Self-pay | Admitting: Adult Health

## 2014-08-19 ENCOUNTER — Other Ambulatory Visit: Payer: Self-pay | Admitting: Adult Health

## 2014-08-19 VITALS — BP 110/70 | HR 62 | Temp 98.6°F | Ht 73.0 in | Wt 209.7 lb

## 2014-08-19 DIAGNOSIS — R05 Cough: Secondary | ICD-10-CM

## 2014-08-19 DIAGNOSIS — I255 Ischemic cardiomyopathy: Secondary | ICD-10-CM

## 2014-08-19 DIAGNOSIS — R059 Cough, unspecified: Secondary | ICD-10-CM

## 2014-08-19 DIAGNOSIS — R062 Wheezing: Secondary | ICD-10-CM

## 2014-08-19 DIAGNOSIS — R0602 Shortness of breath: Secondary | ICD-10-CM | POA: Diagnosis not present

## 2014-08-19 MED ORDER — TORSEMIDE 20 MG PO TABS: 80.0000 mg | ORAL_TABLET | Freq: Every day | ORAL | Status: DC

## 2014-08-19 MED ORDER — DOXYCYCLINE HYCLATE 100 MG PO CAPS: 100.0000 mg | ORAL_CAPSULE | Freq: Two times a day (BID) | ORAL | Status: DC

## 2014-08-19 MED ORDER — PREDNISONE 20 MG PO TABS: 20.0000 mg | ORAL_TABLET | Freq: Every day | ORAL | Status: DC

## 2014-08-19 NOTE — Addendum Note (Signed)
Addended by: Scarlette Calico on: 08/19/2014 01:36 PM   Modules accepted: Orders

## 2014-08-19 NOTE — Progress Notes (Unsigned)
Patient informed of chest x-ray results.   FINDINGS: Heart size and pulmonary vascularity are normal. AICD in place. The lungs are hyperinflated consistent with emphysema. No infiltrates or effusions. Slight chronic accentuation of the thoracic kyphosis. CABG.  IMPRESSION: No acute abnormality. Emphysema    Will treat as COPD exacerbation . Continue with Combivent inhaler as prescribed. 40 mg Prednisone daily x 5 day and Doxycycline 100mg  BID x 7 days. He was advised to go to the ER with any additional SOB.   They will follow up with PCP on Monday

## 2014-08-19 NOTE — Patient Instructions (Addendum)
I will follow up with you after I get the results of your xray. Please get that done today.
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
## 2014-08-19 NOTE — Telephone Encounter (Signed)
Left mess on [name]'s [facility]) VM that med was increased and pt needs labs on 6/7

## 2014-08-21 DIAGNOSIS — H3531 Nonexudative age-related macular degeneration: Secondary | ICD-10-CM | POA: Diagnosis not present

## 2014-08-21 DIAGNOSIS — E119 Type 2 diabetes mellitus without complications: Secondary | ICD-10-CM | POA: Diagnosis not present

## 2014-08-21 DIAGNOSIS — H26491 Other secondary cataract, right eye: Secondary | ICD-10-CM | POA: Diagnosis not present

## 2014-08-21 DIAGNOSIS — H26492 Other secondary cataract, left eye: Secondary | ICD-10-CM | POA: Diagnosis not present

## 2014-08-23 ENCOUNTER — Ambulatory Visit (INDEPENDENT_AMBULATORY_CARE_PROVIDER_SITE_OTHER): Payer: Medicare Other | Admitting: Internal Medicine

## 2014-08-23 ENCOUNTER — Encounter: Payer: Self-pay | Admitting: Internal Medicine

## 2014-08-23 VITALS — BP 124/60 | Temp 98.1°F | Ht 73.0 in | Wt 207.8 lb

## 2014-08-23 DIAGNOSIS — M5430 Sciatica, unspecified side: Secondary | ICD-10-CM | POA: Insufficient documentation

## 2014-08-23 DIAGNOSIS — E1149 Type 2 diabetes mellitus with other diabetic neurological complication: Secondary | ICD-10-CM

## 2014-08-23 DIAGNOSIS — R233 Spontaneous ecchymoses: Secondary | ICD-10-CM

## 2014-08-23 DIAGNOSIS — I255 Ischemic cardiomyopathy: Secondary | ICD-10-CM | POA: Diagnosis not present

## 2014-08-23 DIAGNOSIS — M5431 Sciatica, right side: Secondary | ICD-10-CM | POA: Diagnosis not present

## 2014-08-23 DIAGNOSIS — Z79899 Other long term (current) drug therapy: Secondary | ICD-10-CM

## 2014-08-23 DIAGNOSIS — E1140 Type 2 diabetes mellitus with diabetic neuropathy, unspecified: Secondary | ICD-10-CM

## 2014-08-23 DIAGNOSIS — Z87898 Personal history of other specified conditions: Secondary | ICD-10-CM

## 2014-08-23 DIAGNOSIS — R062 Wheezing: Secondary | ICD-10-CM | POA: Diagnosis not present

## 2014-08-23 DIAGNOSIS — Z789 Other specified health status: Secondary | ICD-10-CM

## 2014-08-23 MED ORDER — GABAPENTIN 100 MG PO CAPS: 100.0000 mg | ORAL_CAPSULE | Freq: Three times a day (TID) | ORAL | Status: DC
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
## 2014-08-23 NOTE — Patient Instructions (Addendum)
Increase the neurontin  ( gabapentin  ) to    Increase to 100 mg 1 po 3 x per day and   See if helps pain   Without making you too groggy.  If  We need to we can increase dose  Call contact us  If not controlling the pain.   The sugars should come down  Off the prednisone .  If over 200  And staying up .  Let us know.  Stay on the inhaler   And call us for refills .   ROV in 2-3 months

## 2014-08-24 DIAGNOSIS — I5043 Acute on chronic combined systolic (congestive) and diastolic (congestive) heart failure: Secondary | ICD-10-CM | POA: Diagnosis not present

## 2014-08-24 DIAGNOSIS — I480 Paroxysmal atrial fibrillation: Secondary | ICD-10-CM | POA: Diagnosis not present

## 2014-08-24 DIAGNOSIS — I129 Hypertensive chronic kidney disease with stage 1 through stage 4 chronic kidney disease, or unspecified chronic kidney disease: Secondary | ICD-10-CM | POA: Diagnosis not present

## 2014-08-24 DIAGNOSIS — I2510 Atherosclerotic heart disease of native coronary artery without angina pectoris: Secondary | ICD-10-CM | POA: Diagnosis not present

## 2014-08-24 DIAGNOSIS — I5022 Chronic systolic (congestive) heart failure: Secondary | ICD-10-CM | POA: Diagnosis not present

## 2014-08-24 DIAGNOSIS — N183 Chronic kidney disease, stage 3 (moderate): Secondary | ICD-10-CM | POA: Diagnosis not present

## 2014-08-24 DIAGNOSIS — E1140 Type 2 diabetes mellitus with diabetic neuropathy, unspecified: Secondary | ICD-10-CM | POA: Diagnosis not present

## 2014-08-27 DIAGNOSIS — E1140 Type 2 diabetes mellitus with diabetic neuropathy, unspecified: Secondary | ICD-10-CM | POA: Diagnosis not present

## 2014-08-27 DIAGNOSIS — I2510 Atherosclerotic heart disease of native coronary artery without angina pectoris: Secondary | ICD-10-CM | POA: Diagnosis not present

## 2014-08-27 DIAGNOSIS — I480 Paroxysmal atrial fibrillation: Secondary | ICD-10-CM | POA: Diagnosis not present

## 2014-08-27 DIAGNOSIS — N183 Chronic kidney disease, stage 3 (moderate): Secondary | ICD-10-CM | POA: Diagnosis not present

## 2014-08-27 DIAGNOSIS — I5043 Acute on chronic combined systolic (congestive) and diastolic (congestive) heart failure: Secondary | ICD-10-CM | POA: Diagnosis not present

## 2014-08-27 DIAGNOSIS — I129 Hypertensive chronic kidney disease with stage 1 through stage 4 chronic kidney disease, or unspecified chronic kidney disease: Secondary | ICD-10-CM | POA: Diagnosis not present

## 2014-08-30 DIAGNOSIS — E1140 Type 2 diabetes mellitus with diabetic neuropathy, unspecified: Secondary | ICD-10-CM | POA: Diagnosis not present

## 2014-08-30 DIAGNOSIS — I5043 Acute on chronic combined systolic (congestive) and diastolic (congestive) heart failure: Secondary | ICD-10-CM | POA: Diagnosis not present

## 2014-08-30 DIAGNOSIS — N183 Chronic kidney disease, stage 3 (moderate): Secondary | ICD-10-CM | POA: Diagnosis not present

## 2014-08-30 DIAGNOSIS — I129 Hypertensive chronic kidney disease with stage 1 through stage 4 chronic kidney disease, or unspecified chronic kidney disease: Secondary | ICD-10-CM | POA: Diagnosis not present

## 2014-08-30 DIAGNOSIS — I2510 Atherosclerotic heart disease of native coronary artery without angina pectoris: Secondary | ICD-10-CM | POA: Diagnosis not present

## 2014-08-30 DIAGNOSIS — I480 Paroxysmal atrial fibrillation: Secondary | ICD-10-CM | POA: Diagnosis not present

## 2014-08-31 ENCOUNTER — Telehealth: Payer: Self-pay | Admitting: Internal Medicine

## 2014-08-31 NOTE — Telephone Encounter (Signed)
Left a message on [name]'s voicemail authorizing extended services.

## 2014-08-31 NOTE — Telephone Encounter (Signed)
Pt home health orders  Is up for renewal please call cindy with verbal ok.

## 2014-09-01 ENCOUNTER — Encounter: Payer: Self-pay | Admitting: Internal Medicine

## 2014-09-02 DIAGNOSIS — N183 Chronic kidney disease, stage 3 (moderate): Secondary | ICD-10-CM | POA: Diagnosis not present

## 2014-09-02 DIAGNOSIS — I5043 Acute on chronic combined systolic (congestive) and diastolic (congestive) heart failure: Secondary | ICD-10-CM | POA: Diagnosis not present

## 2014-09-02 DIAGNOSIS — I129 Hypertensive chronic kidney disease with stage 1 through stage 4 chronic kidney disease, or unspecified chronic kidney disease: Secondary | ICD-10-CM | POA: Diagnosis not present

## 2014-09-02 DIAGNOSIS — E1140 Type 2 diabetes mellitus with diabetic neuropathy, unspecified: Secondary | ICD-10-CM | POA: Diagnosis not present

## 2014-09-02 DIAGNOSIS — J449 Chronic obstructive pulmonary disease, unspecified: Secondary | ICD-10-CM | POA: Diagnosis not present

## 2014-09-02 DIAGNOSIS — S41102A Unspecified open wound of left upper arm, initial encounter: Secondary | ICD-10-CM | POA: Diagnosis not present

## 2014-09-06 DIAGNOSIS — N183 Chronic kidney disease, stage 3 (moderate): Secondary | ICD-10-CM | POA: Diagnosis not present

## 2014-09-06 DIAGNOSIS — E1140 Type 2 diabetes mellitus with diabetic neuropathy, unspecified: Secondary | ICD-10-CM | POA: Diagnosis not present

## 2014-09-06 DIAGNOSIS — I129 Hypertensive chronic kidney disease with stage 1 through stage 4 chronic kidney disease, or unspecified chronic kidney disease: Secondary | ICD-10-CM | POA: Diagnosis not present

## 2014-09-06 DIAGNOSIS — J449 Chronic obstructive pulmonary disease, unspecified: Secondary | ICD-10-CM | POA: Diagnosis not present

## 2014-09-06 DIAGNOSIS — S41102A Unspecified open wound of left upper arm, initial encounter: Secondary | ICD-10-CM | POA: Diagnosis not present

## 2014-09-06 DIAGNOSIS — I5043 Acute on chronic combined systolic (congestive) and diastolic (congestive) heart failure: Secondary | ICD-10-CM | POA: Diagnosis not present

## 2014-09-07 ENCOUNTER — Ambulatory Visit (INDEPENDENT_AMBULATORY_CARE_PROVIDER_SITE_OTHER): Payer: Medicare Other | Admitting: Podiatry

## 2014-09-07 DIAGNOSIS — B351 Tinea unguium: Secondary | ICD-10-CM

## 2014-09-07 DIAGNOSIS — M79676 Pain in unspecified toe(s): Secondary | ICD-10-CM

## 2014-09-07 DIAGNOSIS — E1140 Type 2 diabetes mellitus with diabetic neuropathy, unspecified: Secondary | ICD-10-CM

## 2014-09-07 NOTE — Progress Notes (Signed)
Patient ID: [name], male   DOB: 1929-03-02, 81 y.o.   MRN: [id number] Complaint:  Visit Type: Patient returns to my office for continued preventative foot care services. Complaint: Patient states" my nails have grown long and thick and become painful to walk and wear shoes" Patient has been diagnosed with DM with neuropathy.[name] He presents for preventative foot care services. No changes to ROS  Podiatric Exam: Vascular: dorsalis pedis and posterior tibial pulses are palpable bilateral. Capillary return is immediate. Temperature gradient is WNL. Skin turgor WNL  Sensorium: Diminished  Semmes Weinstein monofilament test. Normal tactile sensation bilaterally. Nail Exam: Pt has thick disfigured discolored nails with subungual debris noted bilateral entire nail hallux through fifth toenails Ulcer Exam: There is no evidence of ulcer or pre-ulcerative changes or infection. Orthopedic Exam: Muscle tone and strength are WNL. No limitations in general ROM. No crepitus or effusions noted. Foot type and digits show no abnormalities. Bony prominences are unremarkable. Skin: No Porokeratosis. No infection or ulcers.  Asymptomatic Callus both feet.  Diagnosis:  Tinea unguium, Pain in right toe, pain in left toes  Treatment & Plan Procedures and Treatment: Consent by patient was obtained for treatment procedures. The patient understood the discussion of treatment and procedures well. All questions were answered thoroughly reviewed. Debridement of mycotic and hypertrophic toenails, 1 through 5 bilateral and clearing of subungual debris. No ulceration, no infection noted.  Return Visit-Office Procedure: Patient instructed to return to the office for a follow up visit 3 months for continued evaluation and treatment.

## 2014-09-08 DIAGNOSIS — J449 Chronic obstructive pulmonary disease, unspecified: Secondary | ICD-10-CM | POA: Diagnosis not present

## 2014-09-08 DIAGNOSIS — E1140 Type 2 diabetes mellitus with diabetic neuropathy, unspecified: Secondary | ICD-10-CM | POA: Diagnosis not present

## 2014-09-08 DIAGNOSIS — S41102A Unspecified open wound of left upper arm, initial encounter: Secondary | ICD-10-CM | POA: Diagnosis not present

## 2014-09-08 DIAGNOSIS — I129 Hypertensive chronic kidney disease with stage 1 through stage 4 chronic kidney disease, or unspecified chronic kidney disease: Secondary | ICD-10-CM

## 2014-09-08 DIAGNOSIS — I5043 Acute on chronic combined systolic (congestive) and diastolic (congestive) heart failure: Secondary | ICD-10-CM | POA: Diagnosis not present

## 2014-09-08 DIAGNOSIS — N183 Chronic kidney disease, stage 3 (moderate): Secondary | ICD-10-CM

## 2014-09-09 ENCOUNTER — Telehealth: Payer: Self-pay | Admitting: Internal Medicine

## 2014-09-09 DIAGNOSIS — E1140 Type 2 diabetes mellitus with diabetic neuropathy, unspecified: Secondary | ICD-10-CM | POA: Diagnosis not present

## 2014-09-09 DIAGNOSIS — I5043 Acute on chronic combined systolic (congestive) and diastolic (congestive) heart failure: Secondary | ICD-10-CM | POA: Diagnosis not present

## 2014-09-09 DIAGNOSIS — J449 Chronic obstructive pulmonary disease, unspecified: Secondary | ICD-10-CM | POA: Diagnosis not present

## 2014-09-09 DIAGNOSIS — I129 Hypertensive chronic kidney disease with stage 1 through stage 4 chronic kidney disease, or unspecified chronic kidney disease: Secondary | ICD-10-CM | POA: Diagnosis not present

## 2014-09-09 DIAGNOSIS — S41102A Unspecified open wound of left upper arm, initial encounter: Secondary | ICD-10-CM | POA: Diagnosis not present

## 2014-09-09 DIAGNOSIS — N183 Chronic kidney disease, stage 3 (moderate): Secondary | ICD-10-CM | POA: Diagnosis not present

## 2014-09-09 NOTE — Telephone Encounter (Signed)
[name] would like to try collagen and hydro gel on his arm wound. Verbal order is ok

## 2014-09-09 NOTE — Telephone Encounter (Signed)
WP gave verbal order.  Cindy notified to proceed.

## 2014-09-10 DIAGNOSIS — N183 Chronic kidney disease, stage 3 (moderate): Secondary | ICD-10-CM | POA: Diagnosis not present

## 2014-09-10 DIAGNOSIS — E1140 Type 2 diabetes mellitus with diabetic neuropathy, unspecified: Secondary | ICD-10-CM | POA: Diagnosis not present

## 2014-09-10 DIAGNOSIS — I5043 Acute on chronic combined systolic (congestive) and diastolic (congestive) heart failure: Secondary | ICD-10-CM | POA: Diagnosis not present

## 2014-09-10 DIAGNOSIS — J449 Chronic obstructive pulmonary disease, unspecified: Secondary | ICD-10-CM | POA: Diagnosis not present

## 2014-09-10 DIAGNOSIS — S41102A Unspecified open wound of left upper arm, initial encounter: Secondary | ICD-10-CM | POA: Diagnosis not present

## 2014-09-10 DIAGNOSIS — I129 Hypertensive chronic kidney disease with stage 1 through stage 4 chronic kidney disease, or unspecified chronic kidney disease: Secondary | ICD-10-CM | POA: Diagnosis not present

## 2014-09-13 DIAGNOSIS — J449 Chronic obstructive pulmonary disease, unspecified: Secondary | ICD-10-CM | POA: Diagnosis not present

## 2014-09-13 DIAGNOSIS — N183 Chronic kidney disease, stage 3 (moderate): Secondary | ICD-10-CM | POA: Diagnosis not present

## 2014-09-13 DIAGNOSIS — I5043 Acute on chronic combined systolic (congestive) and diastolic (congestive) heart failure: Secondary | ICD-10-CM | POA: Diagnosis not present

## 2014-09-13 DIAGNOSIS — E1140 Type 2 diabetes mellitus with diabetic neuropathy, unspecified: Secondary | ICD-10-CM | POA: Diagnosis not present

## 2014-09-13 DIAGNOSIS — S41102A Unspecified open wound of left upper arm, initial encounter: Secondary | ICD-10-CM | POA: Diagnosis not present

## 2014-09-13 DIAGNOSIS — I129 Hypertensive chronic kidney disease with stage 1 through stage 4 chronic kidney disease, or unspecified chronic kidney disease: Secondary | ICD-10-CM | POA: Diagnosis not present

## 2014-09-16 DIAGNOSIS — N183 Chronic kidney disease, stage 3 (moderate): Secondary | ICD-10-CM | POA: Diagnosis not present

## 2014-09-16 DIAGNOSIS — I5043 Acute on chronic combined systolic (congestive) and diastolic (congestive) heart failure: Secondary | ICD-10-CM | POA: Diagnosis not present

## 2014-09-16 DIAGNOSIS — E1140 Type 2 diabetes mellitus with diabetic neuropathy, unspecified: Secondary | ICD-10-CM | POA: Diagnosis not present

## 2014-09-16 DIAGNOSIS — S41102A Unspecified open wound of left upper arm, initial encounter: Secondary | ICD-10-CM | POA: Diagnosis not present

## 2014-09-16 DIAGNOSIS — J449 Chronic obstructive pulmonary disease, unspecified: Secondary | ICD-10-CM | POA: Diagnosis not present

## 2014-09-16 DIAGNOSIS — I129 Hypertensive chronic kidney disease with stage 1 through stage 4 chronic kidney disease, or unspecified chronic kidney disease: Secondary | ICD-10-CM | POA: Diagnosis not present

## 2014-09-21 DIAGNOSIS — I5043 Acute on chronic combined systolic (congestive) and diastolic (congestive) heart failure: Secondary | ICD-10-CM | POA: Diagnosis not present

## 2014-09-21 DIAGNOSIS — J449 Chronic obstructive pulmonary disease, unspecified: Secondary | ICD-10-CM | POA: Diagnosis not present

## 2014-09-21 DIAGNOSIS — I129 Hypertensive chronic kidney disease with stage 1 through stage 4 chronic kidney disease, or unspecified chronic kidney disease: Secondary | ICD-10-CM | POA: Diagnosis not present

## 2014-09-21 DIAGNOSIS — S41102A Unspecified open wound of left upper arm, initial encounter: Secondary | ICD-10-CM | POA: Diagnosis not present

## 2014-09-21 DIAGNOSIS — N183 Chronic kidney disease, stage 3 (moderate): Secondary | ICD-10-CM | POA: Diagnosis not present

## 2014-09-21 DIAGNOSIS — E1140 Type 2 diabetes mellitus with diabetic neuropathy, unspecified: Secondary | ICD-10-CM | POA: Diagnosis not present

## 2014-09-27 ENCOUNTER — Ambulatory Visit (HOSPITAL_COMMUNITY)
Admission: RE | Admit: 2014-09-27 | Discharge: 2014-09-27 | Disposition: A | Discharge status: Home / Self Care | Payer: Medicare Other | Source: Ambulatory Visit | Attending: Cardiology | Admitting: Cardiology

## 2014-09-27 ENCOUNTER — Encounter (HOSPITAL_COMMUNITY): Payer: Self-pay

## 2014-09-27 VITALS — BP 102/60 | HR 71 | Wt 208.8 lb

## 2014-09-27 DIAGNOSIS — Z7952 Long term (current) use of systemic steroids: Secondary | ICD-10-CM | POA: Insufficient documentation

## 2014-09-27 DIAGNOSIS — I480 Paroxysmal atrial fibrillation: Secondary | ICD-10-CM | POA: Insufficient documentation

## 2014-09-27 DIAGNOSIS — N289 Disorder of kidney and ureter, unspecified: Secondary | ICD-10-CM | POA: Diagnosis not present

## 2014-09-27 DIAGNOSIS — Z7901 Long term (current) use of anticoagulants: Secondary | ICD-10-CM | POA: Diagnosis not present

## 2014-09-27 DIAGNOSIS — I255 Ischemic cardiomyopathy: Secondary | ICD-10-CM | POA: Diagnosis not present

## 2014-09-27 DIAGNOSIS — F329 Major depressive disorder, single episode, unspecified: Secondary | ICD-10-CM | POA: Diagnosis not present

## 2014-09-27 DIAGNOSIS — Z951 Presence of aortocoronary bypass graft: Secondary | ICD-10-CM | POA: Insufficient documentation

## 2014-09-27 DIAGNOSIS — I5022 Chronic systolic (congestive) heart failure: Secondary | ICD-10-CM | POA: Diagnosis not present

## 2014-09-27 DIAGNOSIS — E119 Type 2 diabetes mellitus without complications: Secondary | ICD-10-CM | POA: Insufficient documentation

## 2014-09-27 DIAGNOSIS — I6529 Occlusion and stenosis of unspecified carotid artery: Secondary | ICD-10-CM | POA: Insufficient documentation

## 2014-09-27 DIAGNOSIS — I4891 Unspecified atrial fibrillation: Secondary | ICD-10-CM

## 2014-09-27 DIAGNOSIS — Z79899 Other long term (current) drug therapy: Secondary | ICD-10-CM | POA: Diagnosis not present

## 2014-09-27 DIAGNOSIS — I2510 Atherosclerotic heart disease of native coronary artery without angina pectoris: Secondary | ICD-10-CM | POA: Diagnosis not present

## 2014-09-27 DIAGNOSIS — Z794 Long term (current) use of insulin: Secondary | ICD-10-CM | POA: Insufficient documentation

## 2014-09-27 DIAGNOSIS — Z87891 Personal history of nicotine dependence: Secondary | ICD-10-CM | POA: Insufficient documentation

## 2014-09-27 DIAGNOSIS — I129 Hypertensive chronic kidney disease with stage 1 through stage 4 chronic kidney disease, or unspecified chronic kidney disease: Secondary | ICD-10-CM | POA: Diagnosis not present

## 2014-09-27 DIAGNOSIS — G2581 Restless legs syndrome: Secondary | ICD-10-CM | POA: Insufficient documentation

## 2014-09-27 DIAGNOSIS — E785 Hyperlipidemia, unspecified: Secondary | ICD-10-CM | POA: Insufficient documentation

## 2014-09-27 LAB — CBC
HEMATOCRIT: 36.8 % — AB (ref 39.0–52.0)
HEMOGLOBIN: 11.6 g/dL — AB (ref 13.0–17.0)
MCH: 28.5 pg (ref 26.0–34.0)
MCHC: 31.5 g/dL (ref 30.0–36.0)
MCV: 90.4 fL (ref 78.0–100.0)
PLATELETS: 158 10*3/uL (ref 150–400)
RBC: 4.07 MIL/uL — ABNORMAL LOW (ref 4.22–5.81)
RDW: 15.6 % — ABNORMAL HIGH (ref 11.5–15.5)
WBC: 13.5 10*3/uL — ABNORMAL HIGH (ref 4.0–10.5)

## 2014-09-27 LAB — COMPREHENSIVE METABOLIC PANEL
ALT: 20 U/L (ref 17–63)
AST: 23 U/L (ref 15–41)
Albumin: 3.2 g/dL — ABNORMAL LOW (ref 3.5–5.0)
Alkaline Phosphatase: 62 U/L (ref 38–126)
Anion gap: 9 (ref 5–15)
BILIRUBIN TOTAL: 0.8 mg/dL (ref 0.3–1.2)
BUN: 32 mg/dL — ABNORMAL HIGH (ref 6–20)
CALCIUM: 9.1 mg/dL (ref 8.9–10.3)
CO2: 28 mmol/L (ref 22–32)
CREATININE: 2.18 mg/dL — AB (ref 0.61–1.24)
Chloride: 104 mmol/L (ref 101–111)
GFR calc non Af Amer: 26 mL/min — ABNORMAL LOW (ref >60)
GFR, EST AFRICAN AMERICAN: 30 mL/min — AB (ref >60)
Glucose, Bld: 180 mg/dL — ABNORMAL HIGH (ref 65–99)
Potassium: 4.1 mmol/L (ref 3.5–5.1)
SODIUM: 141 mmol/L (ref 135–145)
Total Protein: 5.8 g/dL — ABNORMAL LOW (ref 6.5–8.1)

## 2014-09-27 LAB — TSH: TSH: 1.004 u[IU]/mL (ref 0.350–4.500)

## 2014-09-27 LAB — DIGOXIN LEVEL: Digoxin Level: 0.9 ng/mL (ref 0.8–2.0)

## 2014-09-27 MED ORDER — TORSEMIDE 20 MG PO TABS: 80.0000 mg | ORAL_TABLET | Freq: Every day | ORAL | Status: DC

## 2014-09-27 MED ORDER — AMIODARONE HCL 200 MG PO TABS: 200.0000 mg | ORAL_TABLET | Freq: Every day | ORAL | Status: DC

## 2014-09-27 NOTE — Progress Notes (Signed)
Patient ID: [name], male   DOB: 10-18-1928, 81 y.o.   MRN: [id number] PCP: Dr. Regis Bill  81 yo with history of CAD s/p CABG, ischemic cardiomyopathy, and paroxysmal atrial fibrillation presents for followup.  Patient was admitted in 2/16 with CHF and diuresed, he was sent home on Lasix 60 mg bid.  He missed 3 days of the pm Lasix dose and was re-admitted on 05/18/14 with acute on chronic systolic CHF with dyspnea and hypoxemia. He was diuresed with Lasix gtt and metolazone.  While in the hospital, he went into atrial fibrillation with RVR requiring cardioversion.  Creatinine was elevated and ramipril was stopped.  We had to cut back on Coreg due to hypotension and low output.  He was begun on low dose digoxin given CKD.    He returns for follow up today.  Weight is down 3 lbs since last appointment.  No chest pain.  He is short of breath walking about 40 yards.  This is stable.  No orthopnea/PND.  No lightheadedness.  He occasionally falls, usually when he bends over then stands upright quickly (loses balance).  Most recent fall was this weekend.  He has already done PT for balance training.  He uses a cane.   Labs (3/16): K 4.2, creatinine 2.26, digoxin < 0.2 Labs (05/31/2014): TSH 2.3 Dig level 0.6  Labs (06/14/2014): K 4.5 Creatinine 2.14, LDL 28, HDL 46 Labs (6/16): K 4.5, creatinine 2.08, BNP 712  PMH: 1. CAD: S/p CABG 2005 with LIMA-Diagonal, SVG-LAD, SVG-LCx, SVG-RCA.  Cardiolite in 2/15 with EF 19%, no ischemia.  2. Ischemic cardiomyopathy: Echo (2/16) with EF 20-25%, severe LV dilation with RWMAs, moderate AI, moderate MR, moderately decreased RV systolic function.  St Jude CRT-D device.  3. Atrial fibrillation: Paroxysmal.  DCCV 3/16.  4. CKD 5. Carotid stenosis: Carotid dopplers (1/16) with 60-79% RICA stenosis.  6. HTN 7. Hyperlipidemia 8. Restless leg syndrome. 9. Type 2 diabetes. 10. OA 11. Depression 12. H/o CCY 22. H/o appy  SH: Married, quit smoking 1980, [place] [employer]  vet  FH: CAD  ROS: All systems reviewed and negative except as per HPI.   Current Outpatient Prescriptions  Medication Sig Dispense Refill  . acetaminophen (TYLENOL) 500 MG tablet Take 500 mg by mouth daily as needed for mild pain.    [name] apixaban (ELIQUIS) 2.5 MG TABS tablet Take 1 tablet (2.5 mg total) by mouth 2 (two) times daily. 180 tablet 3  . atorvastatin (LIPITOR) 40 MG tablet Take 1 tablet (40 mg total) by mouth daily. 90 tablet 3  . carvedilol (COREG) 6.25 MG tablet Take 1 tablet (6.25 mg total) by mouth 2 (two) times daily with a meal. 60 tablet 3  . COMBIVENT RESPIMAT 20-100 MCG/ACT AERS respimat Take 2 puffs by mouth every 6 (six) hours as needed for wheezing.   0  . DIGOX 125 MCG tablet TAKE 1/2 TABLET(0.0625MG ) BY MOUTH EVERY DAY 30 tablet 2  . doxycycline (VIBRAMYCIN) 100 MG capsule Take 1 capsule (100 mg total) by mouth 2 (two) times daily. 20 capsule 0  . escitalopram (LEXAPRO) 10 MG tablet Take 1 tablet (10 mg total) by mouth daily. 90 tablet 1  . gabapentin (NEURONTIN) 100 MG capsule Take 1 capsule (100 mg total) by mouth 3 (three) times daily. Can increase as directed 90 capsule 2  . glipiZIDE (GLUCOTROL) 10 MG tablet Take 1 tablet (10 mg total) by mouth daily. 180 tablet 1  . hydrALAZINE (APRESOLINE) 25 MG tablet Take 0.5 tablets (  12.5 mg total) by mouth every 8 (eight) hours. 90 tablet 3  . Insulin Detemir (LEVEMIR) 100 UNIT/ML Pen Inject 18 Units into the skin at bedtime. as directed (Patient taking differently: Inject 10 Units into the skin at bedtime. ) 2 pen 0  . isosorbide mononitrate (IMDUR) 30 MG 24 hr tablet Take 15 mg by mouth daily.    . polyethylene glycol (MIRALAX) packet Take 17 g by mouth daily. 14 each 0  . potassium chloride SA (K-DUR,KLOR-CON) 20 MEQ tablet Take 2 tablets (40 mEq total) by mouth daily. 60 tablet 3  . pramipexole (MIRAPEX) 0.5 MG tablet Take 2-3 hours before sleep for restless leg. 90 tablet 1  . predniSONE (DELTASONE) 20 MG tablet Take 1  tablet (20 mg total) by mouth daily with breakfast. 2 pills daily for the first 3 days.1 pill daily for the next 2 days. 8 tablet 0  . saxagliptin HCl (ONGLYZA) 5 MG TABS tablet Take 1 tablet (5 mg total) by mouth daily. 90 tablet 1  . silodosin (RAPAFLO) 8 MG CAPS capsule Take 1 capsule (8 mg total) by mouth daily with breakfast. 90 capsule 3  . spironolactone (ALDACTONE) 25 MG tablet Take 0.5 tablets (12.5 mg total) by mouth daily. 15 tablet 3  . torsemide (DEMADEX) 20 MG tablet Take 4 tablets (80 mg total) by mouth daily. If you gain 3 lb in a week take 5 tabs (100 mg) for 2 days then back to 80 mg daily 120 tablet 3  . amiodarone (PACERONE) 200 MG tablet Take 1 tablet (200 mg total) by mouth daily. 90 tablet 3   No current facility-administered medications for this encounter.   BP 102/60 mmHg  Pulse 71  Wt 208 lb 12.8 oz (94.711 kg)  SpO2 91% General: NAD Wife present.  Neck: No JVD, no thyromegaly or thyroid nodule.  Lungs: Clear to auscultation bilaterally with normal respiratory effort. CV: Nondisplaced PMI.  Heart regular S1/S2, no S3/S4, 2/6 early SEM RUSB.  1+ ankle edema. No carotid bruit.  Normal pedal pulses.  Abdomen: Soft, nontender, no hepatosplenomegaly, mild to modeate distention.  Skin: Intact without lesions or rashes.  Neurologic: Alert and oriented x 3.  Psych: Normal affect. Extremities: No clubbing or cyanosis.  HEENT: Normal.   Assessment/Plan: 1. Chronic systolic CHF: Ischemic cardiomyopathy.  EF 20-25% by 2/16 echo with moderate RV dysfunction.  Volume status stable.  Stable NYHA class III symptoms. Not much BP room for medi - Continue torsemide 80 mg daily but increase it to 100 mg daily x 2 days if weight rises 3 lbs in a week.    - Continue hydralazine/Imdur at current dosing.   - Will leave off ACEI for now with elevated creatinine.  - Continue current spironolactone.  BMET today.  - Continue current digoxin.  Check level today.    2. Atrial fibrillation:  Paroxysmal.  He is not in atrial fibrillation today.  Continue amiodarone and Eliquis (dosed at 2.5 mg bid with age and renal dysfunction).  Will need to make sure to have regular eye exam with amiodarone.  Check LFTs and TSH today.  3. CKD: Will remain off ACEI for now.  4. CAD: No chest pain, stable.  Continue on statin.  He is not on ASA 81 given stable CAD and Eliquis use.  5. Carotid stenosis: Repeat carotid dopplers 1/17.   Follow up in 3 months.   [name] 09/27/2014    Patient ID: [name], male   DOB: 31-Aug-1928,  81 y.o.   MRN: [id number]

## 2014-09-27 NOTE — Patient Instructions (Signed)
If you gain 3 lb in a week increase Torsemide to 100 mg (5 tabs) daily for 2 days then back to 80 mg (4 tabs) daily  Labs today  We will contact you in 3 months to schedule your next appointment.

## 2014-09-28 ENCOUNTER — Telehealth: Payer: Self-pay | Admitting: Internal Medicine

## 2014-09-28 DIAGNOSIS — I5043 Acute on chronic combined systolic (congestive) and diastolic (congestive) heart failure: Secondary | ICD-10-CM | POA: Diagnosis not present

## 2014-09-28 DIAGNOSIS — N183 Chronic kidney disease, stage 3 (moderate): Secondary | ICD-10-CM | POA: Diagnosis not present

## 2014-09-28 DIAGNOSIS — I129 Hypertensive chronic kidney disease with stage 1 through stage 4 chronic kidney disease, or unspecified chronic kidney disease: Secondary | ICD-10-CM | POA: Diagnosis not present

## 2014-09-28 DIAGNOSIS — J449 Chronic obstructive pulmonary disease, unspecified: Secondary | ICD-10-CM | POA: Diagnosis not present

## 2014-09-28 DIAGNOSIS — E1140 Type 2 diabetes mellitus with diabetic neuropathy, unspecified: Secondary | ICD-10-CM | POA: Diagnosis not present

## 2014-09-28 DIAGNOSIS — S41102A Unspecified open wound of left upper arm, initial encounter: Secondary | ICD-10-CM | POA: Diagnosis not present

## 2014-09-28 NOTE — Telephone Encounter (Signed)
Noted  

## 2014-09-28 NOTE — Telephone Encounter (Signed)
[name] is calling to report pt  had another fall and has 2 new skin tears. [name] Reichmann rn is treating the same as other skin tears.

## 2014-10-01 ENCOUNTER — Ambulatory Visit (INDEPENDENT_AMBULATORY_CARE_PROVIDER_SITE_OTHER): Payer: Medicare Other | Admitting: Pulmonary Disease

## 2014-10-01 ENCOUNTER — Encounter: Payer: Self-pay | Admitting: Pulmonary Disease

## 2014-10-01 VITALS — BP 116/68 | HR 59 | Ht 74.0 in | Wt 208.8 lb

## 2014-10-01 DIAGNOSIS — I255 Ischemic cardiomyopathy: Secondary | ICD-10-CM | POA: Diagnosis not present

## 2014-10-01 DIAGNOSIS — G4733 Obstructive sleep apnea (adult) (pediatric): Secondary | ICD-10-CM

## 2014-10-01 DIAGNOSIS — Z9989 Dependence on other enabling machines and devices: Principal | ICD-10-CM

## 2014-10-01 NOTE — Patient Instructions (Signed)
Will get CPAP report and arrange for new CPAP supplies Follow up in 1 year

## 2014-10-01 NOTE — Progress Notes (Signed)
Chief Complaint  Patient presents with  . Follow-up    OSA> Using CPAP nightly. Pressure and mask seem to be tolerable. No complaints.     History of Present Illness: [name] is a 81 y.o. male with OSA.  He has been doing well with CPAP.  He has full face mask.  He gets about 7 to 8 hrs sleep per night.  He wakes up 1 or 2 times to use the bathroom.  He feels okay during the day, but will nap for an hour.  He does not use CPAP when he naps.  TESTS: HST 07/17/09 >> AHI 8.4, RDI 13.9  PMHx >> CVA, ischemic CM s/p AICD, A fib, HLD, HTN, RLS, CAD, DM, Depression, autoimmune hemolytic anemia  PSHx, Medications, Allergies, Fhx, Shx reviewed.  Physical Exam: BP 116/68 mmHg  Pulse 59  Ht 6\' 2"  (1.88 m)  Wt 208 lb 12.8 oz (94.711 kg)  BMI 26.80 kg/m2  SpO2 95%  General - No distress ENT - No sinus tenderness, no oral exudate, no LAN Cardiac - s1s2 regular, no murmur Chest - No wheeze/rales/dullness Back - No focal tenderness Abd - Soft, non-tender Ext - No edema Neuro - Normal strength Skin - No rashes Psych - normal mood, and behavior   Assessment/Plan:  Obstructive sleep apnea. He is compliant with therapy and reports benefit. Plan: - will get his CPAP download and call him with results - will arrange for new CPAP supplies   [name] Mires, MD [facility] Pager:  [phone]

## 2014-10-05 DIAGNOSIS — J449 Chronic obstructive pulmonary disease, unspecified: Secondary | ICD-10-CM | POA: Diagnosis not present

## 2014-10-05 DIAGNOSIS — E1140 Type 2 diabetes mellitus with diabetic neuropathy, unspecified: Secondary | ICD-10-CM | POA: Diagnosis not present

## 2014-10-05 DIAGNOSIS — I5043 Acute on chronic combined systolic (congestive) and diastolic (congestive) heart failure: Secondary | ICD-10-CM | POA: Diagnosis not present

## 2014-10-05 DIAGNOSIS — S41102A Unspecified open wound of left upper arm, initial encounter: Secondary | ICD-10-CM | POA: Diagnosis not present

## 2014-10-05 DIAGNOSIS — N183 Chronic kidney disease, stage 3 (moderate): Secondary | ICD-10-CM | POA: Diagnosis not present

## 2014-10-05 DIAGNOSIS — I129 Hypertensive chronic kidney disease with stage 1 through stage 4 chronic kidney disease, or unspecified chronic kidney disease: Secondary | ICD-10-CM | POA: Diagnosis not present

## 2014-10-07 ENCOUNTER — Encounter: Payer: Self-pay | Admitting: Internal Medicine

## 2014-10-07 ENCOUNTER — Ambulatory Visit (INDEPENDENT_AMBULATORY_CARE_PROVIDER_SITE_OTHER): Payer: Medicare Other | Admitting: *Deleted

## 2014-10-07 DIAGNOSIS — I5022 Chronic systolic (congestive) heart failure: Secondary | ICD-10-CM | POA: Diagnosis not present

## 2014-10-07 DIAGNOSIS — I255 Ischemic cardiomyopathy: Secondary | ICD-10-CM | POA: Diagnosis not present

## 2014-10-07 NOTE — Progress Notes (Signed)
Remote ICD transmission.   

## 2014-10-12 DIAGNOSIS — J449 Chronic obstructive pulmonary disease, unspecified: Secondary | ICD-10-CM | POA: Diagnosis not present

## 2014-10-12 DIAGNOSIS — I129 Hypertensive chronic kidney disease with stage 1 through stage 4 chronic kidney disease, or unspecified chronic kidney disease: Secondary | ICD-10-CM | POA: Diagnosis not present

## 2014-10-12 DIAGNOSIS — I5043 Acute on chronic combined systolic (congestive) and diastolic (congestive) heart failure: Secondary | ICD-10-CM | POA: Diagnosis not present

## 2014-10-12 DIAGNOSIS — N183 Chronic kidney disease, stage 3 (moderate): Secondary | ICD-10-CM | POA: Diagnosis not present

## 2014-10-12 DIAGNOSIS — E1140 Type 2 diabetes mellitus with diabetic neuropathy, unspecified: Secondary | ICD-10-CM | POA: Diagnosis not present

## 2014-10-12 DIAGNOSIS — S41102A Unspecified open wound of left upper arm, initial encounter: Secondary | ICD-10-CM | POA: Diagnosis not present

## 2014-10-13 ENCOUNTER — Other Ambulatory Visit: Payer: Self-pay | Admitting: Internal Medicine

## 2014-10-14 ENCOUNTER — Telehealth: Payer: Self-pay | Admitting: Internal Medicine

## 2014-10-14 NOTE — Telephone Encounter (Signed)
Denice Bors from [place] is calling to report pt  arm wound is almost heal and cindy switch back  to versatil and foam dressing.

## 2014-10-15 NOTE — Telephone Encounter (Signed)
Noted  

## 2014-10-18 ENCOUNTER — Other Ambulatory Visit (HOSPITAL_COMMUNITY): Payer: Self-pay | Admitting: *Deleted

## 2014-10-18 DIAGNOSIS — I25810 Atherosclerosis of coronary artery bypass graft(s) without angina pectoris: Secondary | ICD-10-CM

## 2014-10-18 MED ORDER — CARVEDILOL 6.25 MG PO TABS: 6.2500 mg | ORAL_TABLET | Freq: Two times a day (BID) | ORAL | Status: DC

## 2014-10-18 MED ORDER — SPIRONOLACTONE 25 MG PO TABS: 12.5000 mg | ORAL_TABLET | Freq: Every day | ORAL | Status: DC

## 2014-10-18 MED ORDER — HYDRALAZINE HCL 25 MG PO TABS: 12.5000 mg | ORAL_TABLET | Freq: Three times a day (TID) | ORAL | Status: DC

## 2014-10-19 ENCOUNTER — Other Ambulatory Visit (HOSPITAL_COMMUNITY): Payer: Self-pay | Admitting: Internal Medicine

## 2014-10-19 DIAGNOSIS — S41102A Unspecified open wound of left upper arm, initial encounter: Secondary | ICD-10-CM | POA: Diagnosis not present

## 2014-10-19 DIAGNOSIS — I5043 Acute on chronic combined systolic (congestive) and diastolic (congestive) heart failure: Secondary | ICD-10-CM | POA: Diagnosis not present

## 2014-10-19 DIAGNOSIS — E1140 Type 2 diabetes mellitus with diabetic neuropathy, unspecified: Secondary | ICD-10-CM | POA: Diagnosis not present

## 2014-10-19 DIAGNOSIS — I129 Hypertensive chronic kidney disease with stage 1 through stage 4 chronic kidney disease, or unspecified chronic kidney disease: Secondary | ICD-10-CM | POA: Diagnosis not present

## 2014-10-19 DIAGNOSIS — J449 Chronic obstructive pulmonary disease, unspecified: Secondary | ICD-10-CM | POA: Diagnosis not present

## 2014-10-19 DIAGNOSIS — N183 Chronic kidney disease, stage 3 (moderate): Secondary | ICD-10-CM | POA: Diagnosis not present

## 2014-10-23 LAB — CUP PACEART REMOTE DEVICE CHECK
Battery Remaining Longevity: 36 mo
Battery Remaining Percentage: 71 %
Brady Statistic AP VP Percent: 98 %
Brady Statistic AP VS Percent: 1 %
Brady Statistic RA Percent Paced: 98 %
Date Time Interrogation Session: 20160721060018
HighPow Impedance: 41 Ohm
HighPow Impedance: 41 Ohm
Lead Channel Impedance Value: 350 Ohm
Lead Channel Pacing Threshold Amplitude: 0.75 V
Lead Channel Pacing Threshold Amplitude: 3.125 V
Lead Channel Pacing Threshold Pulse Width: 0.5 ms
Lead Channel Sensing Intrinsic Amplitude: 2.1 mV
Lead Channel Setting Pacing Amplitude: 2.5 V
Lead Channel Setting Pacing Amplitude: 4.125
Lead Channel Setting Pacing Pulse Width: 1 ms
Lead Channel Setting Sensing Sensitivity: 0.5 mV
MDC IDC MSMT BATTERY VOLTAGE: 2.93 V
MDC IDC MSMT LEADCHNL LV IMPEDANCE VALUE: 400 Ohm
MDC IDC MSMT LEADCHNL LV PACING THRESHOLD PULSEWIDTH: 1 ms
MDC IDC MSMT LEADCHNL RA IMPEDANCE VALUE: 400 Ohm
MDC IDC MSMT LEADCHNL RA PACING THRESHOLD PULSEWIDTH: 0.5 ms
MDC IDC MSMT LEADCHNL RV PACING THRESHOLD AMPLITUDE: 1.25 V
MDC IDC MSMT LEADCHNL RV SENSING INTR AMPL: 12 mV
MDC IDC SET LEADCHNL RA PACING AMPLITUDE: 2 V
MDC IDC SET LEADCHNL RV PACING PULSEWIDTH: 0.5 ms
MDC IDC SET ZONE DETECTION INTERVAL: 250 ms
MDC IDC SET ZONE DETECTION INTERVAL: 300 ms
MDC IDC STAT BRADY AS VP PERCENT: 1.6 %
MDC IDC STAT BRADY AS VS PERCENT: 1 %
Pulse Gen Serial Number: 7170478
Zone Setting Detection Interval: 375 ms

## 2014-10-26 DIAGNOSIS — J449 Chronic obstructive pulmonary disease, unspecified: Secondary | ICD-10-CM | POA: Diagnosis not present

## 2014-10-26 DIAGNOSIS — I129 Hypertensive chronic kidney disease with stage 1 through stage 4 chronic kidney disease, or unspecified chronic kidney disease: Secondary | ICD-10-CM | POA: Diagnosis not present

## 2014-10-26 DIAGNOSIS — I5043 Acute on chronic combined systolic (congestive) and diastolic (congestive) heart failure: Secondary | ICD-10-CM | POA: Diagnosis not present

## 2014-10-26 DIAGNOSIS — N183 Chronic kidney disease, stage 3 (moderate): Secondary | ICD-10-CM | POA: Diagnosis not present

## 2014-10-26 DIAGNOSIS — S41102A Unspecified open wound of left upper arm, initial encounter: Secondary | ICD-10-CM | POA: Diagnosis not present

## 2014-10-26 DIAGNOSIS — E1140 Type 2 diabetes mellitus with diabetic neuropathy, unspecified: Secondary | ICD-10-CM | POA: Diagnosis not present

## 2014-11-01 ENCOUNTER — Other Ambulatory Visit: Payer: Self-pay | Admitting: Internal Medicine

## 2014-11-01 NOTE — Telephone Encounter (Signed)
Sent to the pharmacy by e-scribe. 

## 2014-11-03 ENCOUNTER — Encounter: Payer: Self-pay | Admitting: Cardiology

## 2014-11-15 ENCOUNTER — Other Ambulatory Visit: Payer: Self-pay | Admitting: Internal Medicine

## 2014-12-06 ENCOUNTER — Other Ambulatory Visit: Payer: Self-pay | Admitting: Internal Medicine

## 2014-12-09 ENCOUNTER — Encounter: Payer: Self-pay | Admitting: Podiatry

## 2014-12-09 ENCOUNTER — Ambulatory Visit (INDEPENDENT_AMBULATORY_CARE_PROVIDER_SITE_OTHER): Payer: Medicare Other | Admitting: Podiatry

## 2014-12-09 DIAGNOSIS — E119 Type 2 diabetes mellitus without complications: Secondary | ICD-10-CM

## 2014-12-09 DIAGNOSIS — M79676 Pain in unspecified toe(s): Secondary | ICD-10-CM

## 2014-12-09 DIAGNOSIS — E1140 Type 2 diabetes mellitus with diabetic neuropathy, unspecified: Secondary | ICD-10-CM

## 2014-12-09 DIAGNOSIS — B351 Tinea unguium: Secondary | ICD-10-CM | POA: Diagnosis not present

## 2014-12-09 NOTE — Progress Notes (Signed)
Patient ID: [name], male   DOB: 09-30-1928, 81 y.o.   MRN: [id number] Complaint:  Visit Type: Patient returns to my office for continued preventative foot care services. Complaint: Patient states" my nails have grown long and thick and become painful to walk and wear shoes" Patient has been diagnosed with DM with neuropathy.[name] He presents for preventative foot care services. No changes to ROS  Podiatric Exam: Vascular: dorsalis pedis and posterior tibial pulses are palpable bilateral. Capillary return is immediate. Temperature gradient is WNL. Skin turgor WNL  Sensorium: Diminished  Semmes Weinstein monofilament test. Normal tactile sensation bilaterally. Nail Exam: Pt has thick disfigured discolored nails with subungual debris noted bilateral entire nail hallux through fifth toenails Ulcer Exam: There is no evidence of ulcer or pre-ulcerative changes or infection. Orthopedic Exam: Muscle tone and strength are WNL. No limitations in general ROM. No crepitus or effusions noted. Foot type and digits show no abnormalities. Bony prominences are unremarkable. Asymptomatic HAV B/L. Skin: No Porokeratosis. No infection or ulcers.  Asymptomatic Callus both feet.  Diagnosis:  Tinea unguium, Pain in right toe, pain in left toes  Treatment & Plan Procedures and Treatment: Consent by patient was obtained for treatment procedures. The patient understood the discussion of treatment and procedures well. All questions were answered thoroughly reviewed. Debridement of mycotic and hypertrophic toenails, 1 through 5 bilateral and clearing of subungual debris. No ulceration, no infection noted. Initiate diabetic shoe paperwork. Return Visit-Office Procedure: Patient instructed to return to the office for a follow up visit 3 months for continued evaluation and treatment.

## 2014-12-13 ENCOUNTER — Other Ambulatory Visit: Payer: Self-pay | Admitting: Internal Medicine

## 2014-12-27 ENCOUNTER — Encounter: Payer: Self-pay | Admitting: Internal Medicine

## 2014-12-27 ENCOUNTER — Encounter (HOSPITAL_COMMUNITY): Payer: Self-pay

## 2014-12-27 ENCOUNTER — Ambulatory Visit (HOSPITAL_COMMUNITY)
Admission: RE | Admit: 2014-12-27 | Discharge: 2014-12-27 | Disposition: A | Discharge status: Home / Self Care | Payer: Medicare Other | Source: Ambulatory Visit | Attending: Cardiology | Admitting: Cardiology

## 2014-12-27 VITALS — BP 116/62 | HR 60 | Wt 207.0 lb

## 2014-12-27 DIAGNOSIS — Z79899 Other long term (current) drug therapy: Secondary | ICD-10-CM | POA: Insufficient documentation

## 2014-12-27 DIAGNOSIS — I5022 Chronic systolic (congestive) heart failure: Secondary | ICD-10-CM | POA: Diagnosis not present

## 2014-12-27 DIAGNOSIS — N183 Chronic kidney disease, stage 3 unspecified: Secondary | ICD-10-CM

## 2014-12-27 DIAGNOSIS — F329 Major depressive disorder, single episode, unspecified: Secondary | ICD-10-CM | POA: Diagnosis not present

## 2014-12-27 DIAGNOSIS — I2510 Atherosclerotic heart disease of native coronary artery without angina pectoris: Secondary | ICD-10-CM | POA: Diagnosis not present

## 2014-12-27 DIAGNOSIS — Z951 Presence of aortocoronary bypass graft: Secondary | ICD-10-CM | POA: Diagnosis not present

## 2014-12-27 DIAGNOSIS — G4733 Obstructive sleep apnea (adult) (pediatric): Secondary | ICD-10-CM | POA: Diagnosis not present

## 2014-12-27 DIAGNOSIS — E1122 Type 2 diabetes mellitus with diabetic chronic kidney disease: Secondary | ICD-10-CM | POA: Diagnosis not present

## 2014-12-27 DIAGNOSIS — Z7984 Long term (current) use of oral hypoglycemic drugs: Secondary | ICD-10-CM | POA: Diagnosis not present

## 2014-12-27 DIAGNOSIS — E785 Hyperlipidemia, unspecified: Secondary | ICD-10-CM | POA: Insufficient documentation

## 2014-12-27 DIAGNOSIS — I6529 Occlusion and stenosis of unspecified carotid artery: Secondary | ICD-10-CM | POA: Insufficient documentation

## 2014-12-27 DIAGNOSIS — Z7902 Long term (current) use of antithrombotics/antiplatelets: Secondary | ICD-10-CM | POA: Insufficient documentation

## 2014-12-27 DIAGNOSIS — I2583 Coronary atherosclerosis due to lipid rich plaque: Secondary | ICD-10-CM

## 2014-12-27 DIAGNOSIS — Z8249 Family history of ischemic heart disease and other diseases of the circulatory system: Secondary | ICD-10-CM | POA: Insufficient documentation

## 2014-12-27 DIAGNOSIS — I480 Paroxysmal atrial fibrillation: Secondary | ICD-10-CM | POA: Diagnosis not present

## 2014-12-27 DIAGNOSIS — Z794 Long term (current) use of insulin: Secondary | ICD-10-CM | POA: Diagnosis not present

## 2014-12-27 DIAGNOSIS — I255 Ischemic cardiomyopathy: Secondary | ICD-10-CM | POA: Diagnosis not present

## 2014-12-27 DIAGNOSIS — G2581 Restless legs syndrome: Secondary | ICD-10-CM | POA: Diagnosis not present

## 2014-12-27 DIAGNOSIS — I129 Hypertensive chronic kidney disease with stage 1 through stage 4 chronic kidney disease, or unspecified chronic kidney disease: Secondary | ICD-10-CM | POA: Insufficient documentation

## 2014-12-27 DIAGNOSIS — I509 Heart failure, unspecified: Secondary | ICD-10-CM

## 2014-12-27 DIAGNOSIS — Z95810 Presence of automatic (implantable) cardiac defibrillator: Secondary | ICD-10-CM | POA: Insufficient documentation

## 2014-12-27 LAB — COMPREHENSIVE METABOLIC PANEL
ALBUMIN: 3.3 g/dL — AB (ref 3.5–5.0)
ALK PHOS: 71 U/L (ref 38–126)
ALT: 21 U/L (ref 17–63)
AST: 22 U/L (ref 15–41)
Anion gap: 8 (ref 5–15)
BUN: 34 mg/dL — AB (ref 6–20)
CALCIUM: 9.2 mg/dL (ref 8.9–10.3)
CO2: 31 mmol/L (ref 22–32)
Chloride: 103 mmol/L (ref 101–111)
Creatinine, Ser: 2.12 mg/dL — ABNORMAL HIGH (ref 0.61–1.24)
GFR calc Af Amer: 31 mL/min — ABNORMAL LOW (ref >60)
GFR, EST NON AFRICAN AMERICAN: 27 mL/min — AB (ref >60)
GLUCOSE: 233 mg/dL — AB (ref 65–99)
POTASSIUM: 3.9 mmol/L (ref 3.5–5.1)
Sodium: 142 mmol/L (ref 135–145)
TOTAL PROTEIN: 5.9 g/dL — AB (ref 6.5–8.1)
Total Bilirubin: 0.4 mg/dL (ref 0.3–1.2)

## 2014-12-27 LAB — CBC
HEMATOCRIT: 39.1 % (ref 39.0–52.0)
Hemoglobin: 12.1 g/dL — ABNORMAL LOW (ref 13.0–17.0)
MCH: 28.1 pg (ref 26.0–34.0)
MCHC: 30.9 g/dL (ref 30.0–36.0)
MCV: 90.9 fL (ref 78.0–100.0)
Platelets: 159 10*3/uL (ref 150–400)
RBC: 4.3 MIL/uL (ref 4.22–5.81)
RDW: 14.4 % (ref 11.5–15.5)
WBC: 9.3 10*3/uL (ref 4.0–10.5)

## 2014-12-27 LAB — LIPID PANEL
CHOLESTEROL: 96 mg/dL (ref 0–200)
HDL: 38 mg/dL — ABNORMAL LOW (ref >40)
LDL Cholesterol: 26 mg/dL (ref 0–99)
Total CHOL/HDL Ratio: 2.5 RATIO
Triglycerides: 162 mg/dL — ABNORMAL HIGH (ref <150)
VLDL: 32 mg/dL (ref 0–40)

## 2014-12-27 LAB — BRAIN NATRIURETIC PEPTIDE: B NATRIURETIC PEPTIDE 5: 404.3 pg/mL — AB (ref 0.0–100.0)

## 2014-12-27 LAB — TSH: TSH: 1.365 u[IU]/mL (ref 0.350–4.500)

## 2014-12-27 MED ORDER — SPIRONOLACTONE 25 MG PO TABS: 25.0000 mg | ORAL_TABLET | Freq: Every day | ORAL | Status: DC

## 2014-12-27 NOTE — Patient Instructions (Signed)
INCREASE Spironolactone to 25 mg, one tab daily  Labs today and again in 1 week (bmet)  Your physician has requested that you have a carotid duplex. This test is an ultrasound of the carotid arteries in your neck. It looks at blood flow through these arteries that supply the brain with blood. Allow one hour for this exam. There are no restrictions or special instructions.  Your physician recommends that you schedule a follow-up appointment in: 2 months  Do the following things EVERYDAY: 1) Weigh yourself in the morning before breakfast. Write it down and keep it in a log. 2) Take your medicines as prescribed 3) Eat low salt foods-Limit salt (sodium) to 2000 mg per day.  4) Stay as active as you can everyday 5) Limit all fluids for the day to less than 2 liters 6) [name],CMA

## 2014-12-27 NOTE — Progress Notes (Signed)
Patient ID: [name], male   DOB: 1929/01/28, 81 y.o.   MRN: [id number] PCP: Dr. Regis Bill  81 yo with history of CAD s/p CABG, ischemic cardiomyopathy, and paroxysmal atrial fibrillation presents for followup.  Patient was admitted in 2/16 with CHF and diuresed, he was sent home on Lasix 60 mg bid.  He missed 3 days of the pm Lasix dose and was re-admitted on 05/18/14 with acute on chronic systolic CHF with dyspnea and hypoxemia. He was diuresed with Lasix gtt and metolazone.  While in the hospital, he went into atrial fibrillation with RVR requiring cardioversion.  Creatinine was elevated and ramipril was stopped.  We had to cut back on Coreg due to hypotension and low output.  He was begun on low dose digoxin given CKD.    He returns for follow up today.  Weight is down 1 lb since last appointment.  No chest pain.  No dyspnea, some fatigue walking long distances.  He can climb a flight of steps without problems.  No orthopnea/PND.  No lightheadedness.  No recent falls. No BRBPR or melena.   He was off torsemide for a few days but is back on it again.   Corevue was checked today and was suggestive of recent but resolved volume overload.   Labs (3/16): K 4.2, creatinine 2.26, digoxin < 0.2 Labs (05/31/2014): TSH 2.3 Dig level 0.6  Labs (06/14/2014): K 4.5 Creatinine 2.14, LDL 28, HDL 46 Labs (6/16): K 4.5, creatinine 2.08, BNP 712 Labs (7/16): K 4.1, creatinine 2.18, HCT 36.8, LFTs normal, digoxin 0.9  PMH: 1. CAD: S/p CABG 2005 with LIMA-Diagonal, SVG-LAD, SVG-LCx, SVG-RCA.  Cardiolite in 2/15 with EF 19%, no ischemia.  2. Ischemic cardiomyopathy: Echo (2/16) with EF 20-25%, severe LV dilation with RWMAs, moderate AI, moderate MR, moderately decreased RV systolic function.  St Jude CRT-D device.  3. Atrial fibrillation: Paroxysmal.  DCCV 3/16.  4. CKD 5. Carotid stenosis: Carotid dopplers (1/16) with 60-79% RICA stenosis.  6. HTN 7. Hyperlipidemia 8. Restless leg syndrome. 9. Type 2  diabetes. 10. OA 11. Depression 12. H/o CCY 10. H/o appy 14. OSA: Uses CPAP.   SH: Married, quit smoking 1980, [place] War vet  FH: CAD  ROS: All systems reviewed and negative except as per HPI.   Current Outpatient Prescriptions  Medication Sig Dispense Refill  . acetaminophen (TYLENOL) 500 MG tablet Take 500 mg by mouth daily as needed for mild pain.    [name] amiodarone (PACERONE) 200 MG tablet Take 1 tablet (200 mg total) by mouth daily. 90 tablet 3  . apixaban (ELIQUIS) 2.5 MG TABS tablet Take 1 tablet (2.5 mg total) by mouth 2 (two) times daily. 180 tablet 3  . atorvastatin (LIPITOR) 40 MG tablet Take 1 tablet (40 mg total) by mouth daily. 90 tablet 3  . carvedilol (COREG) 6.25 MG tablet Take 1 tablet (6.25 mg total) by mouth 2 (two) times daily with a meal. 60 tablet 3  . DIGOX 125 MCG tablet TAKE 1/2 TABLET(0.0625MG ) BY MOUTH EVERY DAY 30 tablet 2  . escitalopram (LEXAPRO) 10 MG tablet Take 1 tablet (10 mg total) by mouth daily. 90 tablet 1  . gabapentin (NEURONTIN) 100 MG capsule Take 1 capsule (100 mg total) by mouth 3 (three) times daily. Can increase as directed 90 capsule 2  . glipiZIDE (GLUCOTROL) 10 MG tablet Take 1 tablet (10 mg total) by mouth daily. 180 tablet 1  . hydrALAZINE (APRESOLINE) 25 MG tablet Take 0.5 tablets (12.5 mg total)  by mouth every 8 (eight) hours. 90 tablet 3  . Insulin Detemir (LEVEMIR) 100 UNIT/ML Pen Inject 18 Units into the skin at bedtime. as directed (Patient taking differently: Inject 10 Units into the skin at bedtime. ) 2 pen 0  . isosorbide mononitrate (IMDUR) 30 MG 24 hr tablet Take 15 mg by mouth daily.    [name] LEVEMIR FLEXTOUCH 100 UNIT/ML Pen INJECT 18 UNITS UNDER THE SKIN AT BEDTIME OR AS DIRECTED 15 mL 0  . polyethylene glycol (MIRALAX) packet Take 17 g by mouth daily. 14 each 0  . potassium chloride SA (K-DUR,KLOR-CON) 20 MEQ tablet TAKE 2 TABLETS(40 MEQ) BY MOUTH DAILY 60 tablet 3  . pramipexole (MIRAPEX) 0.5 MG tablet Take 2-3 hours before  sleep for restless leg. 90 tablet 1  . saxagliptin HCl (ONGLYZA) 5 MG TABS tablet Take 1 tablet (5 mg total) by mouth daily. 90 tablet 1  . silodosin (RAPAFLO) 8 MG CAPS capsule Take 1 capsule (8 mg total) by mouth daily with breakfast. 90 capsule 3  . spironolactone (ALDACTONE) 25 MG tablet Take 1 tablet (25 mg total) by mouth daily. 30 tablet 3  . torsemide (DEMADEX) 20 MG tablet Take 4 tablets (80 mg total) by mouth daily. If you gain 3 lb in a week take 5 tabs (100 mg) for 2 days then back to 80 mg daily 120 tablet 3  . torsemide (DEMADEX) 20 MG tablet TAKE 4 TABLETS BY MOUTH DAILY 120 tablet 0   No current facility-administered medications for this encounter.   BP 116/62 mmHg  Pulse 60  Wt 207 lb (93.895 kg)  SpO2 97% General: NAD Wife present.  Neck: JVP 7 cm, no thyromegaly or thyroid nodule.  Lungs: Clear to auscultation bilaterally with normal respiratory effort. CV: Nondisplaced PMI.  Heart regular S1/S2, no S3/S4, 2/6 early SEM RUSB.  1+ ankle edema. Left carotid bruit.  Normal pedal pulses.  Abdomen: Soft, nontender, no hepatosplenomegaly, mild to modeate distention.  Skin: Intact without lesions or rashes.  Neurologic: Alert and oriented x 3.  Psych: Normal affect. Extremities: No clubbing or cyanosis.  HEENT: Normal.   Assessment/Plan: 1. Chronic systolic CHF: Ischemic cardiomyopathy.  EF 20-25% by 2/16 echo with moderate RV dysfunction.  Volume status stable.  Stable NYHA class II symptoms. Corevue suggested recent volume overload, now resolved.  This likely corresponds to a recent period off torsemide.  He is now back on torsemide.   - Continue torsemide 80 mg daily but increase it to 100 mg daily x 2 days if weight rises 3 lbs in a week.    - Continue hydralazine/Imdur at current dosing.   - Will leave off ACEI for now with elevated creatinine.  - Increase spironolactone to 25 mg daily with BMET today and again in 10 days.   - Continue current digoxin.  Check level  today.    2. Atrial fibrillation: Paroxysmal.  He is not in atrial fibrillation today.  Continue amiodarone and Eliquis (dosed at 2.5 mg bid with age and renal dysfunction).  Will need to make sure to have regular eye exam with amiodarone.  Check LFTs and TSH today.  3. CKD: Will remain off ACEI for now.  4. CAD: No chest pain, stable.  Continue on statin.  He is not on ASA 81 given stable CAD and Eliquis use.  5. Carotid stenosis: Repeat carotid dopplers 1/17.   Follow up in 2 months.   [name] 12/27/2014    Patient ID: [name],  male   DOB: 1928/04/21, 81 y.o.   MRN: [id number]

## 2015-01-05 ENCOUNTER — Ambulatory Visit (HOSPITAL_COMMUNITY)
Admission: RE | Admit: 2015-01-05 | Discharge: 2015-01-05 | Disposition: A | Discharge status: Home / Self Care | Payer: Medicare Other | Source: Ambulatory Visit | Attending: Internal Medicine | Admitting: Internal Medicine

## 2015-01-05 DIAGNOSIS — I509 Heart failure, unspecified: Secondary | ICD-10-CM | POA: Insufficient documentation

## 2015-01-05 DIAGNOSIS — I5022 Chronic systolic (congestive) heart failure: Secondary | ICD-10-CM | POA: Diagnosis not present

## 2015-01-05 LAB — BASIC METABOLIC PANEL
Anion gap: 11 (ref 5–15)
BUN: 32 mg/dL — AB (ref 6–20)
CO2: 28 mmol/L (ref 22–32)
Calcium: 9.1 mg/dL (ref 8.9–10.3)
Chloride: 99 mmol/L — ABNORMAL LOW (ref 101–111)
Creatinine, Ser: 2.21 mg/dL — ABNORMAL HIGH (ref 0.61–1.24)
GFR calc Af Amer: 30 mL/min — ABNORMAL LOW (ref >60)
GFR, EST NON AFRICAN AMERICAN: 26 mL/min — AB (ref >60)
Glucose, Bld: 199 mg/dL — ABNORMAL HIGH (ref 65–99)
Potassium: 4.1 mmol/L (ref 3.5–5.1)
Sodium: 138 mmol/L (ref 135–145)

## 2015-01-09 ENCOUNTER — Other Ambulatory Visit: Payer: Self-pay | Admitting: Internal Medicine

## 2015-01-10 ENCOUNTER — Ambulatory Visit (INDEPENDENT_AMBULATORY_CARE_PROVIDER_SITE_OTHER): Payer: Medicare Other | Admitting: *Deleted

## 2015-01-10 ENCOUNTER — Ambulatory Visit (INDEPENDENT_AMBULATORY_CARE_PROVIDER_SITE_OTHER): Payer: Medicare Other | Admitting: Internal Medicine

## 2015-01-10 ENCOUNTER — Other Ambulatory Visit: Payer: Self-pay | Admitting: Internal Medicine

## 2015-01-10 ENCOUNTER — Encounter: Payer: Self-pay | Admitting: Internal Medicine

## 2015-01-10 VITALS — BP 134/72 | Temp 97.8°F | Wt 206.4 lb

## 2015-01-10 DIAGNOSIS — Z79899 Other long term (current) drug therapy: Secondary | ICD-10-CM

## 2015-01-10 DIAGNOSIS — N183 Chronic kidney disease, stage 3 unspecified: Secondary | ICD-10-CM

## 2015-01-10 DIAGNOSIS — I5022 Chronic systolic (congestive) heart failure: Secondary | ICD-10-CM

## 2015-01-10 DIAGNOSIS — E1140 Type 2 diabetes mellitus with diabetic neuropathy, unspecified: Secondary | ICD-10-CM | POA: Diagnosis not present

## 2015-01-10 DIAGNOSIS — I255 Ischemic cardiomyopathy: Secondary | ICD-10-CM

## 2015-01-10 DIAGNOSIS — I509 Heart failure, unspecified: Secondary | ICD-10-CM

## 2015-01-10 DIAGNOSIS — Z23 Encounter for immunization: Secondary | ICD-10-CM | POA: Diagnosis not present

## 2015-01-10 DIAGNOSIS — G2581 Restless legs syndrome: Secondary | ICD-10-CM | POA: Diagnosis not present

## 2015-01-10 DIAGNOSIS — Z87898 Personal history of other specified conditions: Secondary | ICD-10-CM

## 2015-01-10 DIAGNOSIS — R239 Unspecified skin changes: Secondary | ICD-10-CM

## 2015-01-10 DIAGNOSIS — Z789 Other specified health status: Secondary | ICD-10-CM

## 2015-01-10 MED ORDER — ESCITALOPRAM OXALATE 10 MG PO TABS: 10.0000 mg | ORAL_TABLET | Freq: Every day | ORAL | Status: DC

## 2015-01-10 MED ORDER — SILODOSIN 8 MG PO CAPS: 8.0000 mg | ORAL_CAPSULE | Freq: Every day | ORAL | Status: DC

## 2015-01-10 MED ORDER — SAXAGLIPTIN HCL 5 MG PO TABS: 5.0000 mg | ORAL_TABLET | Freq: Every day | ORAL | Status: DC

## 2015-01-10 MED ORDER — GLIPIZIDE 10 MG PO TABS: 10.0000 mg | ORAL_TABLET | Freq: Every day | ORAL | Status: DC

## 2015-01-10 MED ORDER — PRAMIPEXOLE DIHYDROCHLORIDE 0.5 MG PO TABS: ORAL_TABLET | ORAL | Status: DC

## 2015-01-10 NOTE — Patient Instructions (Addendum)
Glad you are feeling better.  Increase the insulin by another 2 units to 14 units per day if your blood sugars are coming down to range in the next 5 days.  Contact us after that if this is not controlling. With blood sugars.  Plan hemoglobin A1c and chemistry panel BMP in December. No office visit needed if you're doing okay. Stop using the cortisone prescription on your nose. It may be causing changes to your skin. You can get a rebound rash when you stop it. See her dermatologist in a month as you have planned.  We'll refill your medications as requested. You are at the maximum dose of the Mirapex for restless leg increasing the dose may not help your symptoms.
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
## 2015-01-11 NOTE — Progress Notes (Signed)
Remote ICD transmission.   

## 2015-01-12 ENCOUNTER — Other Ambulatory Visit (HOSPITAL_COMMUNITY): Payer: Self-pay | Admitting: *Deleted

## 2015-01-12 MED ORDER — DIGOXIN 125 MCG PO TABS: 0.0625 mg | ORAL_TABLET | Freq: Every day | ORAL | Status: DC

## 2015-01-12 MED ORDER — TORSEMIDE 20 MG PO TABS: 80.0000 mg | ORAL_TABLET | Freq: Every day | ORAL | Status: DC

## 2015-01-13 ENCOUNTER — Encounter: Payer: Self-pay | Admitting: Cardiology

## 2015-01-13 LAB — CUP PACEART REMOTE DEVICE CHECK
Battery Remaining Longevity: 35 mo
Battery Voltage: 2.93 V
Brady Statistic AP VP Percent: 99 %
Brady Statistic AP VS Percent: 1 %
Brady Statistic AS VS Percent: 1 %
Brady Statistic RA Percent Paced: 99 %
HighPow Impedance: 47 Ohm
HighPow Impedance: 48 Ohm
Implantable Lead Implant Date: 20051221
Implantable Lead Implant Date: 20150304
Implantable Lead Implant Date: 20150304
Implantable Lead Location: 753859
Implantable Lead Model: 1581
Lead Channel Impedance Value: 410 Ohm
Lead Channel Pacing Threshold Amplitude: 0.75 V
Lead Channel Pacing Threshold Amplitude: 1.25 V
Lead Channel Pacing Threshold Amplitude: 2.625 V
Lead Channel Sensing Intrinsic Amplitude: 2 mV
Lead Channel Setting Pacing Amplitude: 2.5 V
Lead Channel Setting Pacing Amplitude: 3.625
Lead Channel Setting Pacing Pulse Width: 0.5 ms
Lead Channel Setting Sensing Sensitivity: 0.5 mV
MDC IDC LEAD LOCATION: 753858
MDC IDC LEAD LOCATION: 753860
MDC IDC MSMT BATTERY REMAINING PERCENTAGE: 66 %
MDC IDC MSMT LEADCHNL LV IMPEDANCE VALUE: 430 Ohm
MDC IDC MSMT LEADCHNL LV PACING THRESHOLD PULSEWIDTH: 1 ms
MDC IDC MSMT LEADCHNL RA PACING THRESHOLD PULSEWIDTH: 0.5 ms
MDC IDC MSMT LEADCHNL RV IMPEDANCE VALUE: 360 Ohm
MDC IDC MSMT LEADCHNL RV PACING THRESHOLD PULSEWIDTH: 0.5 ms
MDC IDC MSMT LEADCHNL RV SENSING INTR AMPL: 12 mV
MDC IDC SESS DTM: 20161024060014
MDC IDC SET LEADCHNL LV PACING PULSEWIDTH: 1 ms
MDC IDC SET LEADCHNL RA PACING AMPLITUDE: 2 V
MDC IDC STAT BRADY AS VP PERCENT: 1.4 %
Pulse Gen Serial Number: 7170478

## 2015-01-14 ENCOUNTER — Other Ambulatory Visit (HOSPITAL_COMMUNITY): Payer: Self-pay | Admitting: *Deleted

## 2015-01-14 MED ORDER — TORSEMIDE 20 MG PO TABS: 80.0000 mg | ORAL_TABLET | Freq: Every day | ORAL | Status: DC

## 2015-01-17 ENCOUNTER — Other Ambulatory Visit: Payer: Self-pay | Admitting: Internal Medicine

## 2015-02-01 ENCOUNTER — Other Ambulatory Visit: Payer: Self-pay | Admitting: Family Medicine

## 2015-02-01 MED ORDER — PRAMIPEXOLE DIHYDROCHLORIDE 0.5 MG PO TABS: ORAL_TABLET | ORAL | Status: DC

## 2015-02-01 MED ORDER — GLUCOSE BLOOD VI STRP: ORAL_STRIP | Status: DC

## 2015-02-01 MED ORDER — ONETOUCH DELICA LANCETS FINE MISC: Status: AC

## 2015-02-01 MED ORDER — SILODOSIN 8 MG PO CAPS: 8.0000 mg | ORAL_CAPSULE | Freq: Every day | ORAL | Status: DC

## 2015-02-01 MED ORDER — ESCITALOPRAM OXALATE 10 MG PO TABS: 10.0000 mg | ORAL_TABLET | Freq: Every day | ORAL | Status: DC

## 2015-02-01 MED ORDER — GLIPIZIDE 10 MG PO TABS: 10.0000 mg | ORAL_TABLET | Freq: Every day | ORAL | Status: DC

## 2015-02-01 MED ORDER — SAXAGLIPTIN HCL 5 MG PO TABS: 5.0000 mg | ORAL_TABLET | Freq: Every day | ORAL | Status: DC

## 2015-02-09 ENCOUNTER — Other Ambulatory Visit (HOSPITAL_COMMUNITY): Payer: Self-pay | Admitting: Internal Medicine

## 2015-02-15 ENCOUNTER — Ambulatory Visit: Payer: Medicare Other | Admitting: *Deleted

## 2015-02-15 DIAGNOSIS — E1140 Type 2 diabetes mellitus with diabetic neuropathy, unspecified: Secondary | ICD-10-CM

## 2015-02-15 NOTE — Progress Notes (Signed)
Patient ID: [name], male   DOB: 1928/03/23, 81 y.o.   MRN: [id number] Patient presents to be scanned and measured for diabetic shoes and inserts.

## 2015-02-16 DIAGNOSIS — L570 Actinic keratosis: Secondary | ICD-10-CM | POA: Diagnosis not present

## 2015-02-17 DIAGNOSIS — H353110 Nonexudative age-related macular degeneration, right eye, stage unspecified: Secondary | ICD-10-CM | POA: Diagnosis not present

## 2015-02-17 DIAGNOSIS — H26491 Other secondary cataract, right eye: Secondary | ICD-10-CM | POA: Diagnosis not present

## 2015-02-17 DIAGNOSIS — Z961 Presence of intraocular lens: Secondary | ICD-10-CM | POA: Diagnosis not present

## 2015-02-17 DIAGNOSIS — H35033 Hypertensive retinopathy, bilateral: Secondary | ICD-10-CM | POA: Diagnosis not present

## 2015-02-17 DIAGNOSIS — E119 Type 2 diabetes mellitus without complications: Secondary | ICD-10-CM | POA: Diagnosis not present

## 2015-02-17 DIAGNOSIS — H353120 Nonexudative age-related macular degeneration, left eye, stage unspecified: Secondary | ICD-10-CM | POA: Diagnosis not present

## 2015-02-17 LAB — HM DIABETES EYE EXAM

## 2015-02-18 ENCOUNTER — Encounter: Payer: Self-pay | Admitting: Family Medicine

## 2015-02-21 ENCOUNTER — Other Ambulatory Visit: Payer: Self-pay | Admitting: Internal Medicine

## 2015-02-23 NOTE — Telephone Encounter (Signed)
Sent to the pharmacy by e-scribe. 

## 2015-03-02 ENCOUNTER — Ambulatory Visit (INDEPENDENT_AMBULATORY_CARE_PROVIDER_SITE_OTHER): Payer: Medicare Other | Admitting: Podiatry

## 2015-03-02 ENCOUNTER — Telehealth: Payer: Self-pay | Admitting: Podiatry

## 2015-03-02 ENCOUNTER — Encounter: Payer: Self-pay | Admitting: Podiatry

## 2015-03-02 DIAGNOSIS — M79676 Pain in unspecified toe(s): Secondary | ICD-10-CM

## 2015-03-02 DIAGNOSIS — E1140 Type 2 diabetes mellitus with diabetic neuropathy, unspecified: Secondary | ICD-10-CM | POA: Diagnosis not present

## 2015-03-02 DIAGNOSIS — B351 Tinea unguium: Secondary | ICD-10-CM | POA: Diagnosis not present

## 2015-03-02 NOTE — Progress Notes (Signed)
Patient ID: [name], male   DOB: Aug 26, 1928, 81 y.o.   MRN: [id number] Complaint:  Visit Type: Patient returns to my office for continued preventative foot care services. Complaint: Patient states" my nails have grown long and thick and become painful to walk and wear shoes" Patient has been diagnosed with DM with neuropathy.[name] He presents for preventative foot care services. No changes to ROS  Podiatric Exam: Vascular: dorsalis pedis and posterior tibial pulses are palpable bilateral. Capillary return is immediate. Temperature gradient is WNL. Skin turgor WNL  Sensorium: Diminished  Semmes Weinstein monofilament test. Normal tactile sensation bilaterally. Nail Exam: Pt has thick disfigured discolored nails with subungual debris noted bilateral entire nail hallux through fifth toenails Ulcer Exam: There is no evidence of ulcer or pre-ulcerative changes or infection. Orthopedic Exam: Muscle tone and strength are WNL. No limitations in general ROM. No crepitus or effusions noted. Foot type and digits show no abnormalities. Bony prominences are unremarkable. Asymptomatic HAV B/L. Skin: No Porokeratosis. No infection or ulcers.  Asymptomatic Callus both feet.  Diagnosis:  Tinea unguium, Pain in right toe, pain in left toes  Treatment & Plan Procedures and Treatment: Consent by patient was obtained for treatment procedures. The patient understood the discussion of treatment and procedures well. All questions were answered thoroughly reviewed. Debridement of mycotic and hypertrophic toenails, 1 through 5 bilateral and clearing of subungual debris. No ulceration, no infection noted.  Return Visit-Office Procedure: Patient instructed to return to the office for a follow up visit 3 months for continued evaluation and treatment. We will work on getting his shoes from [place].  Gardiner Barefoot DPM

## 2015-03-02 NOTE — Telephone Encounter (Signed)
Called patient and left a message to let him know that his diabetic shoes are here and that Ammie has gone to the main office to pick them up and bring them back here for [place] to dispense. I asked the patient to call back and let him know we can see him this afternoon or tomorrow.

## 2015-03-02 NOTE — Telephone Encounter (Signed)
Per [name], pt is scheduled to come at 10:45 am Thursday December 15. JMS

## 2015-03-03 ENCOUNTER — Ambulatory Visit (INDEPENDENT_AMBULATORY_CARE_PROVIDER_SITE_OTHER): Payer: Medicare Other | Admitting: Podiatry

## 2015-03-03 ENCOUNTER — Encounter: Payer: Self-pay | Admitting: Podiatry

## 2015-03-03 DIAGNOSIS — E1140 Type 2 diabetes mellitus with diabetic neuropathy, unspecified: Secondary | ICD-10-CM

## 2015-03-03 NOTE — Progress Notes (Signed)
Subjective:     Patient ID: [name], male   DOB: 02/24/1929, 81 y.o.   MRN: [id number]  HPIThis patient presents to the office to pick up his diabetic shoes.   Review of Systems     Objective:   Physical Exam Vascular: dorsalis pedis and posterior tibial pulses are palpable bilateral. Capillary return is immediate. Temperature gradient is WNL. Skin turgor WNL  Sensorium: Diminished Semmes Weinstein monofilament test. Normal tactile sensation bilaterally. Nail Exam: Pt has thick disfigured discolored nails with subungual debris noted bilateral entire nail hallux through fifth toenails Ulcer Exam: There is no evidence of ulcer or pre-ulcerative changes or infection. Orthopedic Exam: Muscle tone and strength are WNL. No limitations in general ROM. No crepitus or effusions noted. Foot type and digits show no abnormalities. Bony prominences are unremarkable. Asymptomatic HAV B/L. Skin: No Porokeratosis. No infection or ulcers. Asymptomatic Callus both feet.     Assessment:     Diabetes with neuropathy    Plan:     Dispense shoes.  Patient presents today and was dispensed 0ne pair ( two units) of medically necessary extra depth shoes with three pair( six units) of custom molded multiple density inserts. The shoes and the inserts are fitted to the patients ' feet and are noted to fit well and are free of defect.  Length and width of the shoes are also acceptable.  Patient was given written and verbal  instructions for wearing.  If any concerns arrive with the shoes or inserts, the patient is to call the office.Patient is to follow up with doctor in six weeks.   Gardiner Barefoot DPM

## 2015-03-11 ENCOUNTER — Other Ambulatory Visit (INDEPENDENT_AMBULATORY_CARE_PROVIDER_SITE_OTHER): Payer: Medicare Other

## 2015-03-11 DIAGNOSIS — Z789 Other specified health status: Secondary | ICD-10-CM

## 2015-03-11 DIAGNOSIS — Z87898 Personal history of other specified conditions: Secondary | ICD-10-CM

## 2015-03-11 DIAGNOSIS — N183 Chronic kidney disease, stage 3 unspecified: Secondary | ICD-10-CM

## 2015-03-11 DIAGNOSIS — I5022 Chronic systolic (congestive) heart failure: Secondary | ICD-10-CM

## 2015-03-11 DIAGNOSIS — E1140 Type 2 diabetes mellitus with diabetic neuropathy, unspecified: Secondary | ICD-10-CM | POA: Diagnosis not present

## 2015-03-11 DIAGNOSIS — Z79899 Other long term (current) drug therapy: Secondary | ICD-10-CM | POA: Diagnosis not present

## 2015-03-11 DIAGNOSIS — G2581 Restless legs syndrome: Secondary | ICD-10-CM

## 2015-03-11 LAB — CBC WITH DIFFERENTIAL/PLATELET
BASOS PCT: 0.3 % (ref 0.0–3.0)
Basophils Absolute: 0 10*3/uL (ref 0.0–0.1)
EOS ABS: 0.1 10*3/uL (ref 0.0–0.7)
Eosinophils Relative: 0.8 % (ref 0.0–5.0)
HEMATOCRIT: 40.1 % (ref 39.0–52.0)
HEMOGLOBIN: 12.8 g/dL — AB (ref 13.0–17.0)
LYMPHS PCT: 17.6 % (ref 12.0–46.0)
Lymphs Abs: 1.9 10*3/uL (ref 0.7–4.0)
MCHC: 31.9 g/dL (ref 30.0–36.0)
MCV: 88.8 fl (ref 78.0–100.0)
Monocytes Absolute: 0.8 10*3/uL (ref 0.1–1.0)
Monocytes Relative: 7.6 % (ref 3.0–12.0)
Neutro Abs: 7.8 10*3/uL — ABNORMAL HIGH (ref 1.4–7.7)
Neutrophils Relative %: 73.7 % (ref 43.0–77.0)
Platelets: 174 10*3/uL (ref 150.0–400.0)
RBC: 4.51 Mil/uL (ref 4.22–5.81)
RDW: 16.2 % — AB (ref 11.5–15.5)
WBC: 10.6 10*3/uL — AB (ref 4.0–10.5)

## 2015-03-11 LAB — BASIC METABOLIC PANEL
BUN: 43 mg/dL — ABNORMAL HIGH (ref 6–23)
CHLORIDE: 102 meq/L (ref 96–112)
CO2: 31 mEq/L (ref 19–32)
CREATININE: 2.01 mg/dL — AB (ref 0.40–1.50)
Calcium: 9.5 mg/dL (ref 8.4–10.5)
GFR: 33.74 mL/min — AB (ref >60.00)
Glucose, Bld: 163 mg/dL — ABNORMAL HIGH (ref 70–99)
POTASSIUM: 4.6 meq/L (ref 3.5–5.1)
Sodium: 142 mEq/L (ref 135–145)

## 2015-03-11 LAB — IBC PANEL
Iron: 46 ug/dL (ref 42–165)
SATURATION RATIOS: 10.4 % — AB (ref 20.0–50.0)
TRANSFERRIN: 316 mg/dL (ref 212.0–360.0)

## 2015-03-11 LAB — FERRITIN: FERRITIN: 15.3 ng/mL — AB (ref 22.0–322.0)

## 2015-03-11 LAB — HEMOGLOBIN A1C: HEMOGLOBIN A1C: 8.8 % — AB (ref 4.6–6.5)

## 2015-03-23 ENCOUNTER — Other Ambulatory Visit: Payer: Self-pay

## 2015-03-23 ENCOUNTER — Ambulatory Visit (HOSPITAL_COMMUNITY)
Admission: RE | Admit: 2015-03-23 | Discharge: 2015-03-23 | Disposition: A | Discharge status: Home / Self Care | Payer: Medicare Other | Source: Ambulatory Visit | Attending: Cardiovascular Disease | Admitting: Cardiovascular Disease

## 2015-03-23 ENCOUNTER — Telehealth: Payer: Self-pay

## 2015-03-23 DIAGNOSIS — E785 Hyperlipidemia, unspecified: Secondary | ICD-10-CM | POA: Insufficient documentation

## 2015-03-23 DIAGNOSIS — I2583 Coronary atherosclerosis due to lipid rich plaque: Secondary | ICD-10-CM

## 2015-03-23 DIAGNOSIS — E119 Type 2 diabetes mellitus without complications: Secondary | ICD-10-CM | POA: Insufficient documentation

## 2015-03-23 DIAGNOSIS — I2510 Atherosclerotic heart disease of native coronary artery without angina pectoris: Secondary | ICD-10-CM | POA: Insufficient documentation

## 2015-03-23 DIAGNOSIS — I6523 Occlusion and stenosis of bilateral carotid arteries: Secondary | ICD-10-CM | POA: Insufficient documentation

## 2015-03-23 DIAGNOSIS — I10 Essential (primary) hypertension: Secondary | ICD-10-CM | POA: Insufficient documentation

## 2015-03-23 MED ORDER — ATORVASTATIN CALCIUM 40 MG PO TABS: 40.0000 mg | ORAL_TABLET | Freq: Every day | ORAL | Status: DC

## 2015-03-23 MED ORDER — AMIODARONE HCL 200 MG PO TABS: 200.0000 mg | ORAL_TABLET | Freq: Every day | ORAL | Status: DC

## 2015-03-23 MED ORDER — APIXABAN 2.5 MG PO TABS: 2.5000 mg | ORAL_TABLET | Freq: Two times a day (BID) | ORAL | Status: DC

## 2015-03-23 NOTE — Telephone Encounter (Addendum)
The pt walked into the clinic and requested refills on his Amiodarone, Eliquis and Atorvastatin for #90. Refills have been faxed to [facility] through the [facility] system.

## 2015-04-11 ENCOUNTER — Ambulatory Visit (INDEPENDENT_AMBULATORY_CARE_PROVIDER_SITE_OTHER): Payer: Medicare Other | Admitting: *Deleted

## 2015-04-11 DIAGNOSIS — I509 Heart failure, unspecified: Secondary | ICD-10-CM

## 2015-04-11 DIAGNOSIS — I255 Ischemic cardiomyopathy: Secondary | ICD-10-CM | POA: Diagnosis not present

## 2015-04-12 NOTE — Progress Notes (Signed)
Remote ICD transmission.   

## 2015-04-13 LAB — CUP PACEART REMOTE DEVICE CHECK
Battery Remaining Longevity: 34 mo
Battery Remaining Percentage: 61 %
Battery Voltage: 2.92 V
Brady Statistic AP VP Percent: 99 %
Brady Statistic RA Percent Paced: 99 %
Date Time Interrogation Session: 20170123070023
HIGH POWER IMPEDANCE MEASURED VALUE: 44 Ohm
HIGH POWER IMPEDANCE MEASURED VALUE: 45 Ohm
Implantable Lead Implant Date: 20150304
Implantable Lead Location: 753859
Lead Channel Impedance Value: 360 Ohm
Lead Channel Impedance Value: 410 Ohm
Lead Channel Pacing Threshold Amplitude: 1.75 V
Lead Channel Pacing Threshold Pulse Width: 1 ms
Lead Channel Setting Pacing Amplitude: 2.5 V
Lead Channel Setting Pacing Pulse Width: 0.5 ms
Lead Channel Setting Pacing Pulse Width: 1 ms
Lead Channel Setting Sensing Sensitivity: 0.5 mV
MDC IDC LEAD IMPLANT DT: 20051221
MDC IDC LEAD IMPLANT DT: 20150304
MDC IDC LEAD LOCATION: 753858
MDC IDC LEAD LOCATION: 753860
MDC IDC MSMT LEADCHNL LV IMPEDANCE VALUE: 430 Ohm
MDC IDC SET LEADCHNL LV PACING AMPLITUDE: 2.75 V
MDC IDC SET LEADCHNL RA PACING AMPLITUDE: 2 V
MDC IDC STAT BRADY AP VS PERCENT: 1 %
MDC IDC STAT BRADY AS VP PERCENT: 1.3 %
MDC IDC STAT BRADY AS VS PERCENT: 1 %
Pulse Gen Serial Number: 7170478

## 2015-04-20 ENCOUNTER — Encounter: Payer: Self-pay | Admitting: Cardiology

## 2015-04-28 ENCOUNTER — Encounter (HOSPITAL_COMMUNITY): Payer: Self-pay

## 2015-04-28 ENCOUNTER — Ambulatory Visit (HOSPITAL_COMMUNITY)
Admission: RE | Admit: 2015-04-28 | Discharge: 2015-04-28 | Disposition: A | Discharge status: Home / Self Care | Payer: Medicare Other | Source: Ambulatory Visit | Attending: Internal Medicine | Admitting: Internal Medicine

## 2015-04-28 VITALS — BP 108/60 | HR 65 | Wt 206.8 lb

## 2015-04-28 DIAGNOSIS — Z87891 Personal history of nicotine dependence: Secondary | ICD-10-CM | POA: Diagnosis not present

## 2015-04-28 DIAGNOSIS — Z951 Presence of aortocoronary bypass graft: Secondary | ICD-10-CM | POA: Insufficient documentation

## 2015-04-28 DIAGNOSIS — N189 Chronic kidney disease, unspecified: Secondary | ICD-10-CM | POA: Insufficient documentation

## 2015-04-28 DIAGNOSIS — I5022 Chronic systolic (congestive) heart failure: Secondary | ICD-10-CM | POA: Insufficient documentation

## 2015-04-28 DIAGNOSIS — I130 Hypertensive heart and chronic kidney disease with heart failure and stage 1 through stage 4 chronic kidney disease, or unspecified chronic kidney disease: Secondary | ICD-10-CM | POA: Diagnosis not present

## 2015-04-28 DIAGNOSIS — I480 Paroxysmal atrial fibrillation: Secondary | ICD-10-CM | POA: Insufficient documentation

## 2015-04-28 DIAGNOSIS — F329 Major depressive disorder, single episode, unspecified: Secondary | ICD-10-CM | POA: Diagnosis not present

## 2015-04-28 DIAGNOSIS — E1122 Type 2 diabetes mellitus with diabetic chronic kidney disease: Secondary | ICD-10-CM | POA: Insufficient documentation

## 2015-04-28 DIAGNOSIS — I255 Ischemic cardiomyopathy: Secondary | ICD-10-CM | POA: Diagnosis not present

## 2015-04-28 DIAGNOSIS — G2581 Restless legs syndrome: Secondary | ICD-10-CM | POA: Diagnosis not present

## 2015-04-28 DIAGNOSIS — Z8249 Family history of ischemic heart disease and other diseases of the circulatory system: Secondary | ICD-10-CM | POA: Insufficient documentation

## 2015-04-28 DIAGNOSIS — E785 Hyperlipidemia, unspecified: Secondary | ICD-10-CM | POA: Insufficient documentation

## 2015-04-28 DIAGNOSIS — Z7902 Long term (current) use of antithrombotics/antiplatelets: Secondary | ICD-10-CM | POA: Insufficient documentation

## 2015-04-28 DIAGNOSIS — Z79899 Other long term (current) drug therapy: Secondary | ICD-10-CM | POA: Insufficient documentation

## 2015-04-28 DIAGNOSIS — G4733 Obstructive sleep apnea (adult) (pediatric): Secondary | ICD-10-CM | POA: Insufficient documentation

## 2015-04-28 DIAGNOSIS — I6529 Occlusion and stenosis of unspecified carotid artery: Secondary | ICD-10-CM | POA: Diagnosis not present

## 2015-04-28 DIAGNOSIS — I5023 Acute on chronic systolic (congestive) heart failure: Secondary | ICD-10-CM | POA: Diagnosis not present

## 2015-04-28 DIAGNOSIS — I2510 Atherosclerotic heart disease of native coronary artery without angina pectoris: Secondary | ICD-10-CM | POA: Insufficient documentation

## 2015-04-28 DIAGNOSIS — Z794 Long term (current) use of insulin: Secondary | ICD-10-CM | POA: Diagnosis not present

## 2015-04-28 LAB — COMPREHENSIVE METABOLIC PANEL
ALK PHOS: 67 U/L (ref 38–126)
ALT: 17 U/L (ref 17–63)
ANION GAP: 10 (ref 5–15)
AST: 21 U/L (ref 15–41)
Albumin: 3.4 g/dL — ABNORMAL LOW (ref 3.5–5.0)
BILIRUBIN TOTAL: 0.7 mg/dL (ref 0.3–1.2)
BUN: 31 mg/dL — ABNORMAL HIGH (ref 6–20)
CALCIUM: 8.9 mg/dL (ref 8.9–10.3)
CO2: 29 mmol/L (ref 22–32)
CREATININE: 1.98 mg/dL — AB (ref 0.61–1.24)
Chloride: 101 mmol/L (ref 101–111)
GFR, EST AFRICAN AMERICAN: 34 mL/min — AB (ref >60)
GFR, EST NON AFRICAN AMERICAN: 29 mL/min — AB (ref >60)
Glucose, Bld: 211 mg/dL — ABNORMAL HIGH (ref 65–99)
Potassium: 4.1 mmol/L (ref 3.5–5.1)
Sodium: 140 mmol/L (ref 135–145)
TOTAL PROTEIN: 5.8 g/dL — AB (ref 6.5–8.1)

## 2015-04-28 LAB — DIGOXIN LEVEL: Digoxin Level: 1.1 ng/mL (ref 0.8–2.0)

## 2015-04-28 LAB — BRAIN NATRIURETIC PEPTIDE: B Natriuretic Peptide: 423 pg/mL — ABNORMAL HIGH (ref 0.0–100.0)

## 2015-04-28 MED ORDER — TORSEMIDE 20 MG PO TABS: 80.0000 mg | ORAL_TABLET | Freq: Every day | ORAL | Status: DC

## 2015-04-28 MED ORDER — ISOSORBIDE MONONITRATE ER 30 MG PO TB24: 30.0000 mg | ORAL_TABLET | Freq: Every day | ORAL | Status: DC

## 2015-04-28 MED ORDER — POTASSIUM CHLORIDE CRYS ER 20 MEQ PO TBCR: 40.0000 meq | EXTENDED_RELEASE_TABLET | Freq: Every day | ORAL | Status: DC

## 2015-04-28 MED ORDER — HYDRALAZINE HCL 25 MG PO TABS: 25.0000 mg | ORAL_TABLET | Freq: Three times a day (TID) | ORAL | Status: DC

## 2015-04-28 NOTE — Progress Notes (Signed)
Patient ID: [name], male   DOB: 11-23-1928, 82 y.o.   MRN: [id number] PCP: Dr. Regis Bill  82 yo with history of CAD s/p CABG, ischemic cardiomyopathy, and paroxysmal atrial fibrillation presents for followup.  Patient was admitted in 2/16 with CHF and diuresed, he was sent home on Lasix 60 mg bid.  He missed 3 days of the pm Lasix dose and was re-admitted on 05/18/14 with acute on chronic systolic CHF with dyspnea and hypoxemia. He was diuresed with Lasix gtt and metolazone.  While in the hospital, he went into atrial fibrillation with RVR requiring cardioversion.  Creatinine was elevated and ramipril was stopped.  We had to cut back on Coreg due to hypotension and low output.  He was begun on low dose digoxin.    He returns for follow up today.  Weight is stable.  No chest pain.  No dyspnea, some fatigue walking long distances.  He can climb a flight of steps without problems.  He can get around the grocery store with no problems.  Able to pick up sticks in the yard without difficulty.  No orthopnea/PND.  No lightheadedness.  No recent falls. No BRBPR or melena.  He has had some abdominal swelling recently.   Corevue was checked today and was suggestive of recent volume overload, resolution, but impedance now starting to trend down again.  No atrial fibrillation.  > 99% BiV pacing.   Labs (3/16): K 4.2, creatinine 2.26, digoxin < 0.2 Labs (05/31/2014): TSH 2.3 Dig level 0.6  Labs (06/14/2014): K 4.5 Creatinine 2.14, LDL 28, HDL 46 Labs (6/16): K 4.5, creatinine 2.08, BNP 712 Labs (7/16): K 4.1, creatinine 2.18, HCT 36.8, LFTs normal, digoxin 0.9 Labs (10/16): LDL 26, HDL 38, TSH normal, LFTs normal Labs (12/16): K 4.6, creatinine 2  PMH: 1. CAD: S/p CABG 2005 with LIMA-Diagonal, SVG-LAD, SVG-LCx, SVG-RCA.  Cardiolite in 2/15 with EF 19%, no ischemia.  2. Ischemic cardiomyopathy: Echo (2/16) with EF 20-25%, severe LV dilation with RWMAs, moderate AI, moderate MR, moderately decreased RV systolic  function.  St Jude CRT-D device.  3. Atrial fibrillation: Paroxysmal.  DCCV 3/16.  4. CKD 5. Carotid stenosis: Carotid dopplers (1/16) with 60-79% RICA stenosis.  Carotids (1/17) with bilateral 40-59% ICA stenosis.  6. HTN 7. Hyperlipidemia 8. Restless leg syndrome. 9. Type 2 diabetes. 10. OA 11. Depression 12. H/o CCY 49. H/o appy 14. OSA: Uses CPAP.   SH: Married, quit smoking 1980, [place] [employer] vet  FH: CAD  ROS: All systems reviewed and negative except as per HPI.   Current Outpatient Prescriptions  Medication Sig Dispense Refill  . acetaminophen (TYLENOL) 500 MG tablet Take 500 mg by mouth daily as needed for mild pain.    [name] amiodarone (PACERONE) 200 MG tablet Take 1 tablet (200 mg total) by mouth daily. 90 tablet 1  . apixaban (ELIQUIS) 2.5 MG TABS tablet Take 1 tablet (2.5 mg total) by mouth 2 (two) times daily. 180 tablet 1  . atorvastatin (LIPITOR) 40 MG tablet Take 1 tablet (40 mg total) by mouth daily. 90 tablet 1  . carvedilol (COREG) 6.25 MG tablet Take 1 tablet (6.25 mg total) by mouth 2 (two) times daily with a meal. 60 tablet 3  . digoxin (LANOXIN) 0.125 MG tablet Take 0.5 tablets (0.0625 mg total) by mouth daily. 15 tablet 3  . escitalopram (LEXAPRO) 10 MG tablet Take 1 tablet (10 mg total) by mouth daily. 90 tablet 1  . gabapentin (NEURONTIN) 100 MG capsule Take  1 capsule (100 mg total) by mouth 3 (three) times daily. Can increase as directed 90 capsule 2  . glipiZIDE (GLUCOTROL) 10 MG tablet Take 1 tablet (10 mg total) by mouth daily. 180 tablet 1  . glucose blood (ONE TOUCH TEST STRIPS) test strip Use to test blood sugar 2-3 times daily 300 each 1  . hydrALAZINE (APRESOLINE) 25 MG tablet Take 1 tablet (25 mg total) by mouth every 8 (eight) hours. 90 tablet 3  . Insulin Detemir (LEVEMIR) 100 UNIT/ML Pen Inject 18 Units into the skin at bedtime. as directed (Patient taking differently: Inject 10 Units into the skin at bedtime. ) 2 pen 0  . isosorbide mononitrate  (IMDUR) 30 MG 24 hr tablet Take 1 tablet (30 mg total) by mouth daily. 30 tablet 6  . LEVEMIR FLEXTOUCH 100 UNIT/ML Pen INJECT 18 UNITS UNDER THE SKIN AT BEDTIME OR AS DIRECTED 15 mL 1  . ONETOUCH DELICA LANCETS FINE MISC Use to test blood sugar 2-3 times daily 300 each 1  . polyethylene glycol (MIRALAX) packet Take 17 g by mouth daily. 14 each 0  . potassium chloride SA (K-DUR,KLOR-CON) 20 MEQ tablet Take 2 tablets (40 mEq total) by mouth daily. 60 tablet 3  . pramipexole (MIRAPEX) 0.5 MG tablet Take 2-3 hours before sleep for restless leg. 90 tablet 1  . saxagliptin HCl (ONGLYZA) 5 MG TABS tablet Take 1 tablet (5 mg total) by mouth daily. 90 tablet 1  . silodosin (RAPAFLO) 8 MG CAPS capsule Take 1 capsule (8 mg total) by mouth daily with breakfast. 90 capsule 1  . spironolactone (ALDACTONE) 25 MG tablet Take 1 tablet (25 mg total) by mouth daily. 30 tablet 3  . torsemide (DEMADEX) 20 MG tablet Take 4 tablets (80 mg total) by mouth daily. If you gain 3 lb in a week take 5 tabs (100 mg) for 2 days then back to 80 mg daily 360 tablet 3   No current facility-administered medications for this encounter.   BP 108/60 mmHg  Pulse 65  Wt 206 lb 12 oz (93.781 kg)  SpO2 97% General: NAD. Wife present.  Neck: JVP 8 cm, no thyromegaly or thyroid nodule.  Lungs: Clear to auscultation bilaterally with normal respiratory effort. CV: Nondisplaced PMI.  Heart regular S1/S2, no S3/S4, 1/6 early SEM RUSB.  1+ ankle edema. Left carotid bruit.  Normal pedal pulses.  Abdomen: Soft, nontender, no hepatosplenomegaly, mild to moderate abdominal distention.  Skin: Intact without lesions or rashes.  Neurologic: Alert and oriented x 3.  Psych: Normal affect. Extremities: No clubbing or cyanosis.  HEENT: Normal.   Assessment/Plan: 1. Chronic systolic CHF: Ischemic cardiomyopathy.  EF 20-25% by 2/16 echo with moderate RV dysfunction.  Stable NYHA class II symptoms. St Jude CRT-D device, 99% BiV pacing.  Corevue and  exam suggestive of mild volume overload.   - Increase torsemide to 80 qam/40 qpm x 2 days then back to 80 mg daily.     - Increase hydralazine to 25 mg tid and Imdur to 30 mg daily.    - Will leave off ACEI for now with elevated creatinine.  - Continue current spironolactone.  - Continue current digoxin.  Check level today.    - BMET/BNP today.  2. Atrial fibrillation: Paroxysmal.  He is not in atrial fibrillation today.  No recent atrial fibrillation by device interrogation.  Continue amiodarone and Eliquis (dosed at 2.5 mg bid with age and renal dysfunction).  Will need to make sure to have regular  eye exam with amiodarone.  Check LFTs and TSH today.  3. CKD: Will remain off ACEI for now.  4. CAD: No chest pain, stable.  Continue on statin.  He is not on ASA 81 given stable CAD and Eliquis use.  Good lipids in 10/16.  5. Carotid stenosis: Repeat carotid dopplers 1/18.   Follow up in 2 months.   [name] 04/28/2015

## 2015-04-28 NOTE — Patient Instructions (Addendum)
INCREASE Hydralazine to 25 mg (1 whole tablet) three times daily.  INCREASE Imdur to 30mg  (1 whole tablet) once daily.  INCREASE Torsemide to 80mg  in am and 40 mg in pm TODAY AND TOMORROW, then reduce back to 80mg  once daily on Saturday.  Refills for potassium and torsemide have been sent electronically to your preferred pharmacy.  Routine lab work today. Will notify you of abnormal results, otherwise no news is good news!  Follow up 2 months.

## 2015-04-29 ENCOUNTER — Other Ambulatory Visit (HOSPITAL_COMMUNITY): Payer: Self-pay

## 2015-04-29 MED ORDER — ISOSORBIDE MONONITRATE ER 30 MG PO TB24: 30.0000 mg | ORAL_TABLET | Freq: Every day | ORAL | Status: DC

## 2015-05-02 ENCOUNTER — Other Ambulatory Visit: Payer: Self-pay | Admitting: Internal Medicine

## 2015-05-02 ENCOUNTER — Other Ambulatory Visit (HOSPITAL_COMMUNITY): Payer: Self-pay | Admitting: Cardiology

## 2015-05-02 MED ORDER — ISOSORBIDE MONONITRATE ER 30 MG PO TB24: 30.0000 mg | ORAL_TABLET | Freq: Every day | ORAL | Status: DC

## 2015-05-02 MED ORDER — HYDRALAZINE HCL 25 MG PO TABS: 25.0000 mg | ORAL_TABLET | Freq: Three times a day (TID) | ORAL | Status: DC

## 2015-05-02 NOTE — Telephone Encounter (Signed)
Denied.  [facility] [address].

## 2015-05-09 ENCOUNTER — Ambulatory Visit (HOSPITAL_COMMUNITY)
Admission: RE | Admit: 2015-05-09 | Discharge: 2015-05-09 | Disposition: A | Discharge status: Home / Self Care | Payer: Medicare Other | Source: Ambulatory Visit | Attending: Cardiology | Admitting: Cardiology

## 2015-05-09 ENCOUNTER — Other Ambulatory Visit (HOSPITAL_COMMUNITY): Payer: Self-pay | Admitting: Cardiology

## 2015-05-09 DIAGNOSIS — I509 Heart failure, unspecified: Secondary | ICD-10-CM | POA: Diagnosis not present

## 2015-05-09 DIAGNOSIS — C44629 Squamous cell carcinoma of skin of left upper limb, including shoulder: Secondary | ICD-10-CM | POA: Diagnosis not present

## 2015-05-09 DIAGNOSIS — C44622 Squamous cell carcinoma of skin of right upper limb, including shoulder: Secondary | ICD-10-CM | POA: Diagnosis not present

## 2015-05-09 DIAGNOSIS — C4441 Basal cell carcinoma of skin of scalp and neck: Secondary | ICD-10-CM | POA: Diagnosis not present

## 2015-05-09 LAB — DIGOXIN LEVEL: Digoxin Level: 0.8 ng/mL (ref 0.8–2.0)

## 2015-05-10 ENCOUNTER — Other Ambulatory Visit (HOSPITAL_COMMUNITY): Payer: Self-pay | Admitting: Internal Medicine

## 2015-05-12 ENCOUNTER — Encounter: Payer: Self-pay | Admitting: Internal Medicine

## 2015-05-16 ENCOUNTER — Other Ambulatory Visit (HOSPITAL_COMMUNITY): Payer: Self-pay | Admitting: Cardiology

## 2015-05-23 ENCOUNTER — Other Ambulatory Visit (HOSPITAL_COMMUNITY): Payer: Self-pay | Admitting: *Deleted

## 2015-05-23 MED ORDER — POTASSIUM CHLORIDE CRYS ER 20 MEQ PO TBCR: 40.0000 meq | EXTENDED_RELEASE_TABLET | Freq: Every day | ORAL | Status: DC

## 2015-05-24 ENCOUNTER — Other Ambulatory Visit (HOSPITAL_COMMUNITY): Payer: Self-pay | Admitting: *Deleted

## 2015-06-01 ENCOUNTER — Ambulatory Visit: Payer: Medicare Other | Admitting: Podiatry

## 2015-06-08 ENCOUNTER — Other Ambulatory Visit (HOSPITAL_COMMUNITY): Payer: Self-pay | Admitting: Cardiology

## 2015-06-09 ENCOUNTER — Ambulatory Visit (INDEPENDENT_AMBULATORY_CARE_PROVIDER_SITE_OTHER): Payer: Medicare Other | Admitting: Podiatry

## 2015-06-09 ENCOUNTER — Encounter: Payer: Self-pay | Admitting: Podiatry

## 2015-06-09 DIAGNOSIS — B351 Tinea unguium: Secondary | ICD-10-CM

## 2015-06-09 DIAGNOSIS — M2010 Hallux valgus (acquired), unspecified foot: Secondary | ICD-10-CM

## 2015-06-09 DIAGNOSIS — M216X9 Other acquired deformities of unspecified foot: Secondary | ICD-10-CM

## 2015-06-09 DIAGNOSIS — Q828 Other specified congenital malformations of skin: Secondary | ICD-10-CM

## 2015-06-09 DIAGNOSIS — E1140 Type 2 diabetes mellitus with diabetic neuropathy, unspecified: Secondary | ICD-10-CM

## 2015-06-09 DIAGNOSIS — M79676 Pain in unspecified toe(s): Secondary | ICD-10-CM | POA: Diagnosis not present

## 2015-06-09 NOTE — Progress Notes (Signed)
Patient ID: [name], male   DOB: 05-30-1928, 82 y.o.   MRN: [id number] Complaint:  Visit Type: Patient returns to my office for continued preventative foot care services. Complaint: Patient states" my nails have grown long and thick and become painful to walk and wear shoes" Patient has been diagnosed with DM with neuropathy.[name] He presents for preventative foot care services. No changes to ROS  Podiatric Exam: Vascular: dorsalis pedis and posterior tibial pulses are palpable bilateral. Capillary return is immediate. Temperature gradient is WNL. Skin turgor WNL  Sensorium: Diminished  Semmes Weinstein monofilament test. Normal tactile sensation bilaterally. Nail Exam: Pt has thick disfigured discolored nails with subungual debris noted bilateral entire nail hallux through fifth toenails Ulcer Exam: There is no evidence of ulcer or pre-ulcerative changes or infection. Orthopedic Exam: Muscle tone and strength are WNL. No limitations in general ROM. No crepitus or effusions noted. Foot type and digits show no abnormalities. Bony prominences are unremarkable. Asymptomatic HAV B/L. Skin:  Porokeratosis sub 5th metatarsal left foot. No infection or ulcers.  Asymptomatic Callus both feet.  Diagnosis:  Tinea unguium, Pain in right toe, pain in left toes, Porokeratosis  Treatment & Plan Procedures and Treatment: Consent by patient was obtained for treatment procedures. The patient understood the discussion of treatment and procedures well. All questions were answered thoroughly reviewed. Debridement of mycotic and hypertrophic toenails, 1 through 5 bilateral and clearing of subungual debris. No ulceration, no infection noted.  Return Visit-Office Procedure: Patient instructed to return to the office for a follow up visit 3 months for continued evaluation and treatment. Initiate diabetic shoe paperwork.  Gardiner Barefoot DPM
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
## 2015-06-21 ENCOUNTER — Encounter: Payer: Self-pay | Admitting: Internal Medicine

## 2015-06-21 ENCOUNTER — Ambulatory Visit (INDEPENDENT_AMBULATORY_CARE_PROVIDER_SITE_OTHER): Payer: Medicare Other | Admitting: Internal Medicine

## 2015-06-21 VITALS — BP 134/60 | Temp 98.8°F | Ht 72.0 in | Wt 200.0 lb

## 2015-06-21 DIAGNOSIS — R54 Age-related physical debility: Secondary | ICD-10-CM | POA: Diagnosis not present

## 2015-06-21 DIAGNOSIS — E1140 Type 2 diabetes mellitus with diabetic neuropathy, unspecified: Secondary | ICD-10-CM

## 2015-06-21 DIAGNOSIS — Z0000 Encounter for general adult medical examination without abnormal findings: Secondary | ICD-10-CM

## 2015-06-21 DIAGNOSIS — Z789 Other specified health status: Secondary | ICD-10-CM

## 2015-06-21 DIAGNOSIS — R233 Spontaneous ecchymoses: Secondary | ICD-10-CM

## 2015-06-21 DIAGNOSIS — Z794 Long term (current) use of insulin: Secondary | ICD-10-CM

## 2015-06-21 DIAGNOSIS — N289 Disorder of kidney and ureter, unspecified: Secondary | ICD-10-CM

## 2015-06-21 DIAGNOSIS — Z87898 Personal history of other specified conditions: Secondary | ICD-10-CM | POA: Diagnosis not present

## 2015-06-21 DIAGNOSIS — E785 Hyperlipidemia, unspecified: Secondary | ICD-10-CM

## 2015-06-21 DIAGNOSIS — I255 Ischemic cardiomyopathy: Secondary | ICD-10-CM | POA: Diagnosis not present

## 2015-06-21 DIAGNOSIS — IMO0001 Reserved for inherently not codable concepts without codable children: Secondary | ICD-10-CM

## 2015-06-21 DIAGNOSIS — Z79899 Other long term (current) drug therapy: Secondary | ICD-10-CM | POA: Diagnosis not present

## 2015-06-21 DIAGNOSIS — I509 Heart failure, unspecified: Secondary | ICD-10-CM

## 2015-06-21 LAB — LIPID PANEL
CHOL/HDL RATIO: 3
Cholesterol: 97 mg/dL (ref 0–200)
HDL: 38.1 mg/dL — AB (ref >39.00)
LDL CALC: 29 mg/dL (ref 0–99)
NONHDL: 58.87
Triglycerides: 151 mg/dL — ABNORMAL HIGH (ref 0.0–149.0)
VLDL: 30.2 mg/dL (ref 0.0–40.0)

## 2015-06-21 LAB — BASIC METABOLIC PANEL
BUN: 50 mg/dL — ABNORMAL HIGH (ref 6–23)
CALCIUM: 9.8 mg/dL (ref 8.4–10.5)
CHLORIDE: 101 meq/L (ref 96–112)
CO2: 31 meq/L (ref 19–32)
Creatinine, Ser: 1.97 mg/dL — ABNORMAL HIGH (ref 0.40–1.50)
GFR: 34.51 mL/min — ABNORMAL LOW (ref >60.00)
Glucose, Bld: 199 mg/dL — ABNORMAL HIGH (ref 70–99)
Potassium: 4.7 mEq/L (ref 3.5–5.1)
SODIUM: 140 meq/L (ref 135–145)

## 2015-06-21 LAB — CBC WITH DIFFERENTIAL/PLATELET
BASOS ABS: 0 10*3/uL (ref 0.0–0.1)
Basophils Relative: 0 % (ref 0.0–3.0)
Eosinophils Absolute: 0.1 10*3/uL (ref 0.0–0.7)
Eosinophils Relative: 0.6 % (ref 0.0–5.0)
HEMATOCRIT: 39.4 % (ref 39.0–52.0)
HEMOGLOBIN: 12.8 g/dL — AB (ref 13.0–17.0)
LYMPHS PCT: 16.3 % (ref 12.0–46.0)
Lymphs Abs: 2.1 10*3/uL (ref 0.7–4.0)
MCHC: 32.6 g/dL (ref 30.0–36.0)
MCV: 87.4 fl (ref 78.0–100.0)
MONOS PCT: 6.8 % (ref 3.0–12.0)
Monocytes Absolute: 0.9 10*3/uL (ref 0.1–1.0)
NEUTROS ABS: 9.7 10*3/uL — AB (ref 1.4–7.7)
Neutrophils Relative %: 76.3 % (ref 43.0–77.0)
PLATELETS: 180 10*3/uL (ref 150.0–400.0)
RBC: 4.51 Mil/uL (ref 4.22–5.81)
RDW: 14.7 % (ref 11.5–15.5)
WBC: 12.7 10*3/uL — ABNORMAL HIGH (ref 4.0–10.5)

## 2015-06-21 LAB — TSH: TSH: 1.68 u[IU]/mL (ref 0.35–4.50)

## 2015-06-21 LAB — HEMOGLOBIN A1C: HEMOGLOBIN A1C: 9.5 % — AB (ref 4.6–6.5)

## 2015-06-21 MED ORDER — SAXAGLIPTIN HCL 5 MG PO TABS: 5.0000 mg | ORAL_TABLET | Freq: Every day | ORAL | Status: DC

## 2015-06-21 MED ORDER — PRAMIPEXOLE DIHYDROCHLORIDE 0.5 MG PO TABS: ORAL_TABLET | ORAL | Status: DC

## 2015-06-21 MED ORDER — GLIPIZIDE 10 MG PO TABS
10.0000 mg | ORAL_TABLET | Freq: Every day | ORAL | Status: DC
Start: 2015-06-21 — End: 2016-06-19

## 2015-06-21 MED ORDER — SPIRONOLACTONE 25 MG PO TABS: 25.0000 mg | ORAL_TABLET | Freq: Every day | ORAL | Status: DC

## 2015-06-21 MED ORDER — ESCITALOPRAM OXALATE 10 MG PO TABS: 10.0000 mg | ORAL_TABLET | Freq: Every day | ORAL | Status: DC

## 2015-06-21 NOTE — Patient Instructions (Addendum)
Stop sugar drinks for a month  Sweet tea ... And pepsies  To see if helps the  Blood sugar control If not then  Can increase the insulin to  17 units    To get CBG to below 140.   Consider getting formal advanced directive  Signed.   Continuing foot care .   Will notify you  of labs when available. Then plan fu   Either 4-6 months with labs.     Health Maintenance, Male A healthy lifestyle and preventative care can promote health and wellness.  Maintain regular health, dental, and eye exams.  Eat a healthy diet. Foods like vegetables, fruits, whole grains, low-fat dairy products, and lean protein foods contain the nutrients you need and are low in calories. Decrease your intake of foods high in solid fats, added sugars, and salt. Get information about a proper diet from your health care provider, if necessary.  Regular physical exercise is one of the most important things you can do for your health. Most adults should get at least 150 minutes of moderate-intensity exercise (any activity that increases your heart rate and causes you to sweat) each week. In addition, most adults need muscle-strengthening exercises on 2 or more days a week.   Maintain a healthy weight. The body mass index (BMI) is a screening tool to identify possible weight problems. It provides an estimate of body fat based on height and weight. Your health care provider can find your BMI and can help you achieve or maintain a healthy weight. For males 20 years and older:  A BMI below 18.5 is considered underweight.  A BMI of 18.5 to 24.9 is normal.  A BMI of 25 to 29.9 is considered overweight.  A BMI of 30 and above is considered obese.  Maintain normal blood lipids and cholesterol by exercising and minimizing your intake of saturated fat. Eat a balanced diet with plenty of fruits and vegetables. Blood tests for lipids and cholesterol should begin at age 76 and be repeated every 5 years. If your lipid or cholesterol  levels are high, you are over age 51, or you are at high risk for heart disease, you may need your cholesterol levels checked more frequently.Ongoing high lipid and cholesterol levels should be treated with medicines if diet and exercise are not working.  If you smoke, find out from your health care provider how to quit. If you do not use tobacco, do not start.  Lung cancer screening is recommended for adults aged 25-80 years who are at high risk for developing lung cancer because of a history of smoking. A yearly low-dose CT scan of the lungs is recommended for people who have at least a 30-pack-year history of smoking and are current smokers or have quit within the past 15 years. A pack year of smoking is smoking an average of 1 pack of cigarettes a day for 1 year (for example, a 30-pack-year history of smoking could mean smoking 1 pack a day for 30 years or 2 packs a day for 15 years). Yearly screening should continue until the smoker has stopped smoking for at least 15 years. Yearly screening should be stopped for people who develop a health problem that would prevent them from having lung cancer treatment.  If you choose to drink alcohol, do not have more than 2 drinks per day. One drink is considered to be 12 oz (360 mL) of beer, 5 oz (150 mL) of wine, or 1.5 oz (45 mL)  of liquor.  Avoid the use of street drugs. Do not share needles with anyone. Ask for help if you need support or instructions about stopping the use of drugs.  High blood pressure causes heart disease and increases the risk of stroke. High blood pressure is more likely to develop in:  People who have blood pressure in the end of the normal range (100-139/85-89 mm Hg).  People who are overweight or obese.  People who are African American.  If you are 40-55 years of age, have your blood pressure checked every 3-5 years. If you are 64 years of age or older, have your blood pressure checked every year. You should have your blood  pressure measured twice--once when you are at a hospital or clinic, and once when you are not at a hospital or clinic. Record the average of the two measurements. To check your blood pressure when you are not at a hospital or clinic, you can use:  An automated blood pressure machine at a pharmacy.  A home blood pressure monitor.  If you are 5-53 years old, ask your health care provider if you should take aspirin to prevent heart disease.  Diabetes screening involves taking a blood sample to check your fasting blood sugar level. This should be done once every 3 years after age 45 if you are at a normal weight and without risk factors for diabetes. Testing should be considered at a younger age or be carried out more frequently if you are overweight and have at least 1 risk factor for diabetes.  Colorectal cancer can be detected and often prevented. Most routine colorectal cancer screening begins at the age of 62 and continues through age 37. However, your health care provider may recommend screening at an earlier age if you have risk factors for colon cancer. On a yearly basis, your health care provider may provide home test kits to check for hidden blood in the stool. A small camera at the end of a tube may be used to directly examine the colon (sigmoidoscopy or colonoscopy) to detect the earliest forms of colorectal cancer. Talk to your health care provider about this at age 26 when routine screening begins. A direct exam of the colon should be repeated every 5-10 years through age 6, unless early forms of precancerous polyps or small growths are found.  People who are at an increased risk for hepatitis B should be screened for this virus. You are considered at high risk for hepatitis B if:  You were born in a country where hepatitis B occurs often. Talk with your health care provider about which countries are considered high risk.  Your parents were born in a high-risk country and you have not  received a shot to protect against hepatitis B (hepatitis B vaccine).  You have HIV or AIDS.  You use needles to inject street drugs.  You live with, or have sex with, someone who has hepatitis B.  You are a man who has sex with other men (MSM).  You get hemodialysis treatment.  You take certain medicines for conditions like cancer, organ transplantation, and autoimmune conditions.  Hepatitis C blood testing is recommended for all people born from [birth year] through 1963 and any individual with known risk factors for hepatitis C.  Healthy men should no longer receive prostate-specific antigen (PSA) blood tests as part of routine cancer screening. Talk to your health care provider about prostate cancer screening.  Testicular cancer screening is not recommended for adolescents or  adult males who have no symptoms. Screening includes self-exam, a health care provider exam, and other screening tests. Consult with your health care provider about any symptoms you have or any concerns you have about testicular cancer.  Practice safe sex. Use condoms and avoid high-risk sexual practices to reduce the spread of sexually transmitted infections (STIs).  You should be screened for STIs, including gonorrhea and chlamydia if:  You are sexually active and are younger than 24 years.  You are older than 24 years, and your health care provider tells you that you are at risk for this type of infection.  Your sexual activity has changed since you were last screened, and you are at an increased risk for chlamydia or gonorrhea. Ask your health care provider if you are at risk.  If you are at risk of being infected with HIV, it is recommended that you take a prescription medicine daily to prevent HIV infection. This is called pre-exposure prophylaxis (PrEP). You are considered at risk if:  You are a man who has sex with other men (MSM).  You are a heterosexual man who is sexually active with multiple  partners.  You take drugs by injection.  You are sexually active with a partner who has HIV.  Talk with your health care provider about whether you are at high risk of being infected with HIV. If you choose to begin PrEP, you should first be tested for HIV. You should then be tested every 3 months for as long as you are taking PrEP.  Use sunscreen. Apply sunscreen liberally and repeatedly throughout the day. You should seek shade when your shadow is shorter than you. Protect yourself by wearing long sleeves, pants, a wide-brimmed hat, and sunglasses year round whenever you are outdoors.  Tell your health care provider of new moles or changes in moles, especially if there is a change in shape or color. Also, tell your health care provider if a mole is larger than the size of a pencil eraser.  A one-time screening for abdominal aortic aneurysm (AAA) and surgical repair of large AAAs by ultrasound is recommended for men aged 41-75 years who are current or former smokers.  Stay current with your vaccines (immunizations).   This information is not intended to replace advice given to you by your health care provider. Make sure you discuss any questions you have with your health care provider.   Document Released: 09/01/2007 Document Revised: 03/26/2014 Document Reviewed: 07/31/2010 Elsevier Interactive Patient Education [employer].

## 2015-06-23 ENCOUNTER — Ambulatory Visit (HOSPITAL_COMMUNITY)
Admission: RE | Admit: 2015-06-23 | Discharge: 2015-06-23 | Disposition: A | Discharge status: Home / Self Care | Payer: Medicare Other | Source: Ambulatory Visit | Attending: Cardiology | Admitting: Cardiology

## 2015-06-23 ENCOUNTER — Encounter (HOSPITAL_COMMUNITY): Payer: Self-pay

## 2015-06-23 VITALS — BP 108/58 | HR 69 | Wt 202.8 lb

## 2015-06-23 DIAGNOSIS — I2510 Atherosclerotic heart disease of native coronary artery without angina pectoris: Secondary | ICD-10-CM | POA: Diagnosis not present

## 2015-06-23 DIAGNOSIS — Z87891 Personal history of nicotine dependence: Secondary | ICD-10-CM | POA: Diagnosis not present

## 2015-06-23 DIAGNOSIS — Z794 Long term (current) use of insulin: Secondary | ICD-10-CM | POA: Insufficient documentation

## 2015-06-23 DIAGNOSIS — N183 Chronic kidney disease, stage 3 unspecified: Secondary | ICD-10-CM

## 2015-06-23 DIAGNOSIS — I5022 Chronic systolic (congestive) heart failure: Secondary | ICD-10-CM

## 2015-06-23 DIAGNOSIS — F329 Major depressive disorder, single episode, unspecified: Secondary | ICD-10-CM | POA: Diagnosis not present

## 2015-06-23 DIAGNOSIS — G4733 Obstructive sleep apnea (adult) (pediatric): Secondary | ICD-10-CM | POA: Diagnosis not present

## 2015-06-23 DIAGNOSIS — Z79899 Other long term (current) drug therapy: Secondary | ICD-10-CM | POA: Insufficient documentation

## 2015-06-23 DIAGNOSIS — I130 Hypertensive heart and chronic kidney disease with heart failure and stage 1 through stage 4 chronic kidney disease, or unspecified chronic kidney disease: Secondary | ICD-10-CM | POA: Insufficient documentation

## 2015-06-23 DIAGNOSIS — N189 Chronic kidney disease, unspecified: Secondary | ICD-10-CM | POA: Insufficient documentation

## 2015-06-23 DIAGNOSIS — Z7901 Long term (current) use of anticoagulants: Secondary | ICD-10-CM | POA: Diagnosis not present

## 2015-06-23 DIAGNOSIS — E785 Hyperlipidemia, unspecified: Secondary | ICD-10-CM | POA: Insufficient documentation

## 2015-06-23 DIAGNOSIS — Z8249 Family history of ischemic heart disease and other diseases of the circulatory system: Secondary | ICD-10-CM | POA: Diagnosis not present

## 2015-06-23 DIAGNOSIS — Z951 Presence of aortocoronary bypass graft: Secondary | ICD-10-CM | POA: Insufficient documentation

## 2015-06-23 DIAGNOSIS — I255 Ischemic cardiomyopathy: Secondary | ICD-10-CM | POA: Insufficient documentation

## 2015-06-23 DIAGNOSIS — G2581 Restless legs syndrome: Secondary | ICD-10-CM | POA: Insufficient documentation

## 2015-06-23 DIAGNOSIS — I6523 Occlusion and stenosis of bilateral carotid arteries: Secondary | ICD-10-CM | POA: Insufficient documentation

## 2015-06-23 DIAGNOSIS — Z95810 Presence of automatic (implantable) cardiac defibrillator: Secondary | ICD-10-CM | POA: Insufficient documentation

## 2015-06-23 DIAGNOSIS — E1122 Type 2 diabetes mellitus with diabetic chronic kidney disease: Secondary | ICD-10-CM | POA: Diagnosis not present

## 2015-06-23 DIAGNOSIS — I480 Paroxysmal atrial fibrillation: Secondary | ICD-10-CM | POA: Insufficient documentation

## 2015-06-23 LAB — DIGOXIN LEVEL: Digoxin Level: 0.8 ng/mL (ref 0.8–2.0)

## 2015-06-23 MED ORDER — TORSEMIDE 20 MG PO TABS: 60.0000 mg | ORAL_TABLET | Freq: Every day | ORAL | Status: DC

## 2015-06-23 MED ORDER — CARVEDILOL 6.25 MG PO TABS: 9.3750 mg | ORAL_TABLET | Freq: Two times a day (BID) | ORAL | Status: DC

## 2015-06-23 NOTE — Patient Instructions (Signed)
Decrease Torsemide to 60 mg (3 tabs) daily.  IF YOU GAIN 3 LBS IN A WEEK INCREASE TO 4 TABS DAILY FOR 4 DAYS ONLY THEN BACK TO 3 TABS DAILY  Increase Carvedilol to 9.37 mg (1 & 1/2 tab) Twice daily   Lab today  Your physician has requested that you have an echocardiogram. Echocardiography is a painless test that uses sound waves to create images of your heart. It provides your doctor with information about the size and shape of your heart and how well your heart's chambers and valves are working. This procedure takes approximately one hour. There are no restrictions for this procedure.  We will contact you in 3 months to schedule your next appointment.

## 2015-06-24 NOTE — Progress Notes (Signed)
Patient ID: [name], male   DOB: [birth year], 82 y.o.   MRN: [id number] PCP: Dr. Regis Bill  82 yo with history of CAD s/p CABG, ischemic cardiomyopathy, and paroxysmal atrial fibrillation presents for followup.  Patient was admitted in 2/16 with CHF and diuresed, he was sent home on Lasix 60 mg bid.  He missed 3 days of the pm Lasix dose and was re-admitted on 05/18/14 with acute on chronic systolic CHF with dyspnea and hypoxemia. He was diuresed with Lasix gtt and metolazone.  While in the hospital, he went into atrial fibrillation with RVR requiring cardioversion.  Creatinine was elevated and ramipril was stopped.  We had to cut back on Coreg due to hypotension and low output.  He was begun on low dose digoxin.    He returns for follow up today.  Weight is down 4 lbs.  No chest pain.  No dyspnea on flat ground, some fatigue walking long distances.  He can climb a flight of steps without problems.  He can get around the grocery store with no problems.  No orthopnea/PND.  No lightheadedness.  No recent falls. No BRBPR or melena.    Labs (3/16): K 4.2, creatinine 2.26, digoxin < 0.2 Labs (05/31/2014): TSH 2.3 Dig level 0.6  Labs (06/14/2014): K 4.5 Creatinine 2.14, LDL 28, HDL 46 Labs (6/16): K 4.5, creatinine 2.08, BNP 712 Labs (7/16): K 4.1, creatinine 2.18, HCT 36.8, LFTs normal, digoxin 0.9 Labs (10/16): LDL 26, HDL 38, TSH normal, LFTs normal Labs (12/16): K 4.6, creatinine 2 Labs (2/17): LFTs normal, digoxin 1.1 Labs (4/17): K 4.7, creatinine 1.97, BUN 50, hgb 12.8, TSH normal  PMH: 1. CAD: S/p CABG 2005 with LIMA-Diagonal, SVG-LAD, SVG-LCx, SVG-RCA.  Cardiolite in 2/15 with EF 19%, no ischemia.  2. Ischemic cardiomyopathy: Echo (2/16) with EF 20-25%, severe LV dilation with RWMAs, moderate AI, moderate MR, moderately decreased RV systolic function.  St Jude CRT-D device.  3. Atrial fibrillation: Paroxysmal.  DCCV 3/16.  4. CKD 5. Carotid stenosis: Carotid dopplers (1/16) with 60-79% RICA  stenosis.  Carotids (1/17) with bilateral 40-59% ICA stenosis.  6. HTN 7. Hyperlipidemia 8. Restless leg syndrome. 9. Type 2 diabetes. 10. OA 11. Depression 12. H/o CCY 63. H/o appy 14. OSA: Uses CPAP.   SH: Married, quit smoking 1980, [place] [employer] vet  FH: CAD  ROS: All systems reviewed and negative except as per HPI.   Current Outpatient Prescriptions  Medication Sig Dispense Refill  . acetaminophen (TYLENOL) 500 MG tablet Take 500 mg by mouth daily as needed for mild pain.    [name] amiodarone (PACERONE) 200 MG tablet Take 1 tablet (200 mg total) by mouth daily. 90 tablet 1  . apixaban (ELIQUIS) 2.5 MG TABS tablet Take 1 tablet (2.5 mg total) by mouth 2 (two) times daily. 180 tablet 1  . atorvastatin (LIPITOR) 40 MG tablet Take 1 tablet (40 mg total) by mouth daily. 90 tablet 1  . carvedilol (COREG) 6.25 MG tablet Take 1.5 tablets (9.375 mg total) by mouth 2 (two) times daily with a meal. 90 tablet 3  . DIGOX 125 MCG tablet TAKE 1/2 TABLET(0.063 MG) BY MOUTH DAILY 15 tablet 0  . escitalopram (LEXAPRO) 10 MG tablet Take 1 tablet (10 mg total) by mouth daily. 90 tablet 3  . gabapentin (NEURONTIN) 100 MG capsule Take 1 capsule (100 mg total) by mouth 3 (three) times daily. Can increase as directed 90 capsule 2  . glipiZIDE (GLUCOTROL) 10 MG tablet Take 1 tablet (10  mg total) by mouth daily. 180 tablet 1  . glucose blood (ONE TOUCH TEST STRIPS) test strip Use to test blood sugar 2-3 times daily 300 each 1  . hydrALAZINE (APRESOLINE) 25 MG tablet Take 1 tablet (25 mg total) by mouth every 8 (eight) hours. 360 tablet 3  . Insulin Detemir (LEVEMIR) 100 UNIT/ML Pen Inject 18 Units into the skin at bedtime. as directed (Patient taking differently: Inject 15 Units into the skin at bedtime. ) 2 pen 0  . isosorbide mononitrate (IMDUR) 30 MG 24 hr tablet Take 1 tablet (30 mg total) by mouth daily. 90 tablet 3  . LEVEMIR FLEXTOUCH 100 UNIT/ML Pen INJECT 18 UNITS UNDER THE SKIN AT BEDTIME OR AS DIRECTED  15 mL 1  . ONETOUCH DELICA LANCETS FINE MISC Use to test blood sugar 2-3 times daily 300 each 1  . polyethylene glycol (MIRALAX) packet Take 17 g by mouth daily. 14 each 0  . potassium chloride SA (K-DUR,KLOR-CON) 20 MEQ tablet Take 2 tablets (40 mEq total) by mouth daily. 180 tablet 3  . pramipexole (MIRAPEX) 0.5 MG tablet Take 2-3 hours before sleep for restless leg. 90 tablet 1  . saxagliptin HCl (ONGLYZA) 5 MG TABS tablet Take 1 tablet (5 mg total) by mouth daily. 90 tablet 3  . silodosin (RAPAFLO) 8 MG CAPS capsule Take 1 capsule (8 mg total) by mouth daily with breakfast. 90 capsule 1  . spironolactone (ALDACTONE) 25 MG tablet Take 1 tablet (25 mg total) by mouth daily. 90 tablet 3  . torsemide (DEMADEX) 20 MG tablet Take 3 tablets (60 mg total) by mouth daily. Take extra as directed for wt gain 120 tablet 0   No current facility-administered medications for this encounter.   BP 108/58 mmHg  Pulse 69  Wt 202 lb 12 oz (91.967 kg)  SpO2 97% General: NAD. Wife present.  Neck: JVP 7 cm, no thyromegaly or thyroid nodule.  Lungs: Clear to auscultation bilaterally with normal respiratory effort. CV: Nondisplaced PMI.  Heart regular S1/S2, no S3/S4, 1/6 HSM LLSB.  Trace ankle edema. Left carotid bruit.  Normal pedal pulses.  Abdomen: Soft, nontender, no hepatosplenomegaly, mild to moderate abdominal distention.  Skin: Intact without lesions or rashes.  Neurologic: Alert and oriented x 3.  Psych: Normal affect. Extremities: No clubbing or cyanosis.  HEENT: Normal.   Assessment/Plan: 1. Chronic systolic CHF: Ischemic cardiomyopathy.  EF 20-25% by 2/16 echo with moderate RV dysfunction.  Stable NYHA class II symptoms. St Jude CRT-D device.  He is not volume overloaded on exam. - Given rise in BUN, decrease torsemide to 60 mg bid.  If weight increases by 3 lbs in a week, increase torsemide to 80 daily x 4 days then back to 60 daily.     - Continue current hydralazine/Imdur  - Will leave off  ACEI for now with elevated creatinine.  - Continue current spironolactone.  - Continue current digoxin.  Check level today.   - Increase Coreg to 9.375 mg bid.  -2. Atrial fibrillation: Paroxysmal.  He is not in atrial fibrillation today by exam.  Continue amiodarone and Eliquis (dosed at 2.5 mg bid with age and renal dysfunction).  Will need to make sure to have regular eye exam with amiodarone.  Recent LFTs and TSH were normal.  3. CKD: Will remain off ACEI for now.  BUN higher.  As above, backing off a bit on torsemide.  4. CAD: No chest pain, stable.  Continue on statin.  He is not  on ASA 81 given stable CAD and Eliquis use.  Good lipids in 10/16.  5. Carotid stenosis: Repeat carotid dopplers 1/18.   Follow up in 3 months.   [name] 06/24/2015

## 2015-06-27 DIAGNOSIS — Z85828 Personal history of other malignant neoplasm of skin: Secondary | ICD-10-CM | POA: Diagnosis not present

## 2015-06-27 DIAGNOSIS — L718 Other rosacea: Secondary | ICD-10-CM | POA: Diagnosis not present

## 2015-06-27 DIAGNOSIS — L570 Actinic keratosis: Secondary | ICD-10-CM | POA: Diagnosis not present

## 2015-07-06 ENCOUNTER — Other Ambulatory Visit (HOSPITAL_COMMUNITY): Payer: Self-pay | Admitting: Cardiology

## 2015-07-06 ENCOUNTER — Encounter: Payer: Self-pay | Admitting: Gastroenterology

## 2015-07-12 ENCOUNTER — Encounter: Payer: Self-pay | Admitting: Internal Medicine

## 2015-07-12 ENCOUNTER — Other Ambulatory Visit: Payer: Self-pay

## 2015-07-12 ENCOUNTER — Ambulatory Visit (HOSPITAL_COMMUNITY): Payer: Medicare Other | Attending: Cardiology

## 2015-07-12 ENCOUNTER — Other Ambulatory Visit (HOSPITAL_COMMUNITY): Payer: Self-pay | Admitting: *Deleted

## 2015-07-12 ENCOUNTER — Ambulatory Visit (INDEPENDENT_AMBULATORY_CARE_PROVIDER_SITE_OTHER): Payer: Medicare Other | Admitting: Internal Medicine

## 2015-07-12 VITALS — BP 122/62 | HR 60 | Ht 72.0 in | Wt 200.2 lb

## 2015-07-12 DIAGNOSIS — I5022 Chronic systolic (congestive) heart failure: Secondary | ICD-10-CM | POA: Diagnosis not present

## 2015-07-12 DIAGNOSIS — Z95810 Presence of automatic (implantable) cardiac defibrillator: Secondary | ICD-10-CM | POA: Diagnosis not present

## 2015-07-12 DIAGNOSIS — E119 Type 2 diabetes mellitus without complications: Secondary | ICD-10-CM | POA: Diagnosis not present

## 2015-07-12 DIAGNOSIS — R29898 Other symptoms and signs involving the musculoskeletal system: Secondary | ICD-10-CM | POA: Insufficient documentation

## 2015-07-12 DIAGNOSIS — I509 Heart failure, unspecified: Secondary | ICD-10-CM

## 2015-07-12 DIAGNOSIS — I352 Nonrheumatic aortic (valve) stenosis with insufficiency: Secondary | ICD-10-CM | POA: Insufficient documentation

## 2015-07-12 DIAGNOSIS — I480 Paroxysmal atrial fibrillation: Secondary | ICD-10-CM | POA: Diagnosis not present

## 2015-07-12 DIAGNOSIS — I340 Nonrheumatic mitral (valve) insufficiency: Secondary | ICD-10-CM | POA: Insufficient documentation

## 2015-07-12 DIAGNOSIS — I517 Cardiomegaly: Secondary | ICD-10-CM | POA: Diagnosis not present

## 2015-07-12 DIAGNOSIS — I255 Ischemic cardiomyopathy: Secondary | ICD-10-CM

## 2015-07-12 LAB — CUP PACEART INCLINIC DEVICE CHECK
Battery Remaining Longevity: 30
Brady Statistic RA Percent Paced: 99 %
Brady Statistic RV Percent Paced: 99.98 %
HighPow Impedance: 42.3709
Implantable Lead Implant Date: 20150304
Implantable Lead Location: 753858
Implantable Lead Location: 753860
Implantable Lead Model: 1581
Lead Channel Pacing Threshold Amplitude: 0.75 V
Lead Channel Pacing Threshold Pulse Width: 0.5 ms
Lead Channel Pacing Threshold Pulse Width: 0.5 ms
Lead Channel Pacing Threshold Pulse Width: 0.5 ms
Lead Channel Pacing Threshold Pulse Width: 1 ms
Lead Channel Sensing Intrinsic Amplitude: 2 mV
Lead Channel Setting Pacing Amplitude: 2 V
Lead Channel Setting Pacing Amplitude: 2.375
Lead Channel Setting Sensing Sensitivity: 0.5 mV
MDC IDC LEAD IMPLANT DT: 20051221
MDC IDC LEAD IMPLANT DT: 20150304
MDC IDC LEAD LOCATION: 753859
MDC IDC MSMT LEADCHNL LV IMPEDANCE VALUE: 425 Ohm
MDC IDC MSMT LEADCHNL LV PACING THRESHOLD AMPLITUDE: 1.75 V
MDC IDC MSMT LEADCHNL LV PACING THRESHOLD AMPLITUDE: 1.75 V
MDC IDC MSMT LEADCHNL LV PACING THRESHOLD PULSEWIDTH: 1 ms
MDC IDC MSMT LEADCHNL RA IMPEDANCE VALUE: 400 Ohm
MDC IDC MSMT LEADCHNL RA PACING THRESHOLD AMPLITUDE: 0.75 V
MDC IDC MSMT LEADCHNL RA PACING THRESHOLD PULSEWIDTH: 0.5 ms
MDC IDC MSMT LEADCHNL RV IMPEDANCE VALUE: 362.5 Ohm
MDC IDC MSMT LEADCHNL RV PACING THRESHOLD AMPLITUDE: 1.25 V
MDC IDC MSMT LEADCHNL RV PACING THRESHOLD AMPLITUDE: 1.25 V
MDC IDC MSMT LEADCHNL RV SENSING INTR AMPL: 12 mV
MDC IDC SESS DTM: 20170425165741
MDC IDC SET LEADCHNL LV PACING PULSEWIDTH: 1 ms
MDC IDC SET LEADCHNL RV PACING AMPLITUDE: 2.5 V
MDC IDC SET LEADCHNL RV PACING PULSEWIDTH: 0.5 ms
Pulse Gen Serial Number: 7170478

## 2015-07-12 MED ORDER — PERFLUTREN LIPID MICROSPHERE
1.0000 mL | INTRAVENOUS | Status: AC | PRN
Start: 2015-07-12 — End: 2015-07-12
  Administered 2015-07-12: 2 mL via INTRAVENOUS

## 2015-07-12 NOTE — Patient Instructions (Signed)
Medication Instructions: No Changes  Labwork: None Ordered  Procedures/Testing: None Ordered  Follow-Up: Your physician wants you to follow-up in: 1 year with Dr. Caryl Comes. You will receive a reminder letter in the mail two months in advance. If you don't receive a letter, please call our office to schedule the follow-up appointment.  Remote monitoring is used to monitor your Pacemaker of ICD from home. This monitoring reduces the number of office visits required to check your device to one time per year. It allows [place] to keep an eye on the functioning of your device to ensure it is working properly. You are scheduled for a device check from home on 10/11/15. You may send your transmission at any time that day. If you have a wireless device, the transmission will be sent automatically. After your physician reviews your transmission, you will receive a postcard with your next transmission date.    Any Additional Special Instructions Will Be Listed Below (If Applicable).     If you need a refill on your cardiac medications before your next appointment, please call your pharmacy.
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
## 2015-07-21 ENCOUNTER — Ambulatory Visit (INDEPENDENT_AMBULATORY_CARE_PROVIDER_SITE_OTHER): Payer: Medicare Other | Admitting: Internal Medicine

## 2015-07-21 VITALS — BP 110/56 | Temp 97.9°F | Ht 72.0 in | Wt 203.1 lb

## 2015-07-21 DIAGNOSIS — E1140 Type 2 diabetes mellitus with diabetic neuropathy, unspecified: Secondary | ICD-10-CM | POA: Diagnosis not present

## 2015-07-21 DIAGNOSIS — R54 Age-related physical debility: Secondary | ICD-10-CM

## 2015-07-21 DIAGNOSIS — K5900 Constipation, unspecified: Secondary | ICD-10-CM

## 2015-07-21 DIAGNOSIS — Z87898 Personal history of other specified conditions: Secondary | ICD-10-CM

## 2015-07-21 DIAGNOSIS — N289 Disorder of kidney and ureter, unspecified: Secondary | ICD-10-CM | POA: Diagnosis not present

## 2015-07-21 DIAGNOSIS — I5022 Chronic systolic (congestive) heart failure: Secondary | ICD-10-CM

## 2015-07-21 DIAGNOSIS — Z794 Long term (current) use of insulin: Secondary | ICD-10-CM

## 2015-07-21 DIAGNOSIS — Z79899 Other long term (current) drug therapy: Secondary | ICD-10-CM

## 2015-07-21 DIAGNOSIS — I255 Ischemic cardiomyopathy: Secondary | ICD-10-CM

## 2015-07-21 DIAGNOSIS — IMO0001 Reserved for inherently not codable concepts without codable children: Secondary | ICD-10-CM

## 2015-07-21 DIAGNOSIS — Z789 Other specified health status: Secondary | ICD-10-CM

## 2015-07-21 MED ORDER — SILODOSIN 8 MG PO CAPS: 8.0000 mg | ORAL_CAPSULE | Freq: Every day | ORAL | Status: DC

## 2015-07-21 NOTE — Patient Instructions (Addendum)
Take the miralax every day  to help with constipation .   Glad your sugar readings ar coming down.  Stay on same dose of insulin for now . Contact  us if Bg are going over 250  On a regular basis  Continue  Sugar readings   And bring them in  When your come.  Ask derm to send [place] copy of notes and diagnosis of the skin lesion.   Otherwise  ROV  And can do an hg a1c at the visit
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
## 2015-07-22 ENCOUNTER — Encounter: Payer: Self-pay | Admitting: Internal Medicine

## 2015-07-22 NOTE — Assessment & Plan Note (Signed)
Now not in control by a1c can inc insulin as palnned but dc sugar beverages seem to help a good bit  Plan close fu and  a1c at next visit  See plan inst.

## 2015-07-29 ENCOUNTER — Ambulatory Visit (INDEPENDENT_AMBULATORY_CARE_PROVIDER_SITE_OTHER): Payer: Medicare Other | Admitting: Physician Assistant

## 2015-07-29 VITALS — BP 134/66 | HR 60 | Temp 98.4°F | Resp 15 | Ht 72.0 in | Wt 199.6 lb

## 2015-07-29 DIAGNOSIS — I255 Ischemic cardiomyopathy: Secondary | ICD-10-CM | POA: Diagnosis not present

## 2015-07-29 DIAGNOSIS — J069 Acute upper respiratory infection, unspecified: Secondary | ICD-10-CM

## 2015-07-29 MED ORDER — DOXYCYCLINE HYCLATE 100 MG PO CAPS: 100.0000 mg | ORAL_CAPSULE | Freq: Two times a day (BID) | ORAL | Status: AC

## 2015-07-29 MED ORDER — BENZONATATE 100 MG PO CAPS: 100.0000 mg | ORAL_CAPSULE | Freq: Three times a day (TID) | ORAL | Status: DC | PRN

## 2015-07-29 NOTE — Progress Notes (Signed)
[facility] [address] [place] [address] [phone]  Date:  07/29/2015   Name:  [name]   DOB:  06-06-1928   MRN:  [id number]  PCP:  Lottie Dawson, MD    Chief Complaint: Cough   History of Present Illness:  This is a 82 y.o. male with PMH HTN, CHF, A fib, DM2, neuropathy, CKD, HLD,  who is presenting with cough , runny nose, scratchy throat and watery eyes x 1 week. Denies fever, chills. States feels like a cold. States he does not have a hx of allergies. No lung disease -- not a smoker. No hx asthma or COPD. He came in because symptoms don't seem to be getting better. He denies sob or wheezing. Hasn't tried anything for his symptoms. PCP: Dr. Regis Bill  Review of Systems:  Review of Systems See HPI  Patient Active Problem List   Diagnosis Date Noted  . Sciatica 08/23/2014  . Senile ecchymosis 08/23/2014  . Atrial fibrillation ([place]) 07/28/2014  . CHF exacerbation ([place])   . Hypokalemia   . Abdominal distention   . Cardiomyopathy, ischemic   . Chronic kidney disease, stage III (moderate)   . Diabetes type 2, controlled ([place])   . Depression   . HCAP (healthcare-associated pneumonia) 05/18/2014  . Acute on chronic combined systolic and diastolic congestive heart failure ([place])   . Acute respiratory failure with hypoxia ([place])   . Diabetes mellitus type 2, controlled ([place]) 04/28/2014  . Chronic kidney disease 04/28/2014  . Leucocytosis 04/28/2014  . AKI (acute kidney injury) ([place]) 04/28/2014  . Acute on chronic systolic congestive heart failure ([place])   . CHF (congestive heart failure) ([place]) 04/27/2014  . Shortness of breath 03/25/2014  . Weight gain 03/25/2014  . Leg edema, left 03/25/2014  . Paroxysmal atrial fibrillation ([place]) 03/05/2014  . Acute renal failure ([place]) 03/02/2014  . CKD (chronic kidney disease) stage 3, GFR 30-59 ml/min 03/02/2014  . Hyperlipemia 03/02/2014  . Hyperkalemia 03/02/2014  . Essential hypertension,  benign 03/02/2014  . Cellulitis of left lower extremity 03/01/2014  . Cellulitis 03/01/2014  . Medication management 07/30/2013  . Encounter for fitting or adjustment of automatic implantable cardioverter-defibrillator 04/16/2013  . Corns/callosities 04/02/2013  . Iron deficiency anemia 09/03/2012  . Medically complex patient 09/03/2012  . Nocturnal leg movements 06/23/2012  . Sleep difficulties 01/12/2012  . Back pain, sacroiliac 01/12/2012  . Renal insufficiency 09/25/2011  . Diabetic neuropathy, type II diabetes mellitus ([place]) 08/14/2011  . Leg pain thigh with exercise  08/14/2011  . Memory problem 08/14/2011  . Hearing impaired hearing aids 08/14/2011  . Neuropathy ([place]) 08/14/2011  . Fatigue 08/14/2011  . CAD (coronary artery disease) 11/13/2010  . Ischemic cardiomyopathy   . Autoimmune hemolytic anemias 01/04/2010  . ENLARGEMENT OF LYMPH NODES 01/04/2010  . WEIGHT LOSS 01/03/2010  . UNS ADVRS EFF OTH RX MEDICINAL&BIOLOGICAL SBSTNC 07/27/2009  . Obstructive sleep apnea 07/19/2009  . ARTHRITIS, HIP 04/20/2009  . Cerebrovascular disease 01/27/2009  . SYSTOLIC HEART FAILURE, CHRONIC 07/20/2008  . Hypothyroidism 07/14/2008  . RESTLESS LEG SYNDROME 07/14/2008  . NOCTURIA 07/14/2008  . Hyperlipidemia 09/25/2006  . Essential hypertension 09/25/2006    Prior to Admission medications   Medication Sig Start Date End Date Taking? Authorizing Provider  acetaminophen (TYLENOL) 500 MG tablet Take 500 mg by mouth daily as needed for mild pain.   Yes Historical Provider, MD  amiodarone (PACERONE) 200 MG tablet Take 1 tablet (200 mg total) by mouth daily. 03/23/15  Yes Lelon Perla, MD  apixaban (ELIQUIS) 2.5 MG TABS tablet Take 1 tablet (2.5 mg total) by mouth 2 (two) times daily. 03/23/15  Yes Lelon Perla, MD  atorvastatin (LIPITOR) 40 MG tablet Take 1 tablet (40 mg total) by mouth daily. 03/23/15  Yes Lelon Perla, MD  carvedilol (COREG) 6.25 MG tablet Take 1.5 tablets (9.375  mg total) by mouth 2 (two) times daily with a meal. 06/23/15  Yes Larey Dresser, MD  [place] 125 MCG tablet TAKE 1/2 TABLET(0.063 MG) BY MOUTH DAILY 07/06/15  Yes Larey Dresser, MD  escitalopram (LEXAPRO) 10 MG tablet Take 1 tablet (10 mg total) by mouth daily. 06/21/15  Yes Burnis Medin, MD  gabapentin (NEURONTIN) 100 MG capsule Take 1 capsule (100 mg total) by mouth 3 (three) times daily. Can increase as directed 08/23/14  Yes Burnis Medin, MD  glipiZIDE (GLUCOTROL) 10 MG tablet Take 1 tablet (10 mg total) by mouth daily. 06/21/15  Yes Burnis Medin, MD  glucose blood (ONE TOUCH TEST STRIPS) test strip Use to test blood sugar 2-3 times daily 02/01/15  Yes Burnis Medin, MD  hydrALAZINE (APRESOLINE) 25 MG tablet Take 1 tablet (25 mg total) by mouth every 8 (eight) hours. 05/02/15  Yes Larey Dresser, MD  hydrocortisone 2.5 % cream Apply 1 application topically as directed. 06/27/15  Yes Historical Provider, MD  Insulin Detemir (LEVEMIR) 100 UNIT/ML Pen Inject 18 Units into the skin at bedtime. as directed Patient taking differently: Inject 15 Units into the skin at bedtime.  01/20/14  Yes Burnis Medin, MD  isosorbide mononitrate (IMDUR) 30 MG 24 hr tablet Take 1 tablet (30 mg total) by mouth daily. 05/02/15  Yes Larey Dresser, MD  LEVEMIR FLEXTOUCH 100 UNIT/ML Pen INJECT 18 UNITS UNDER THE SKIN AT BEDTIME OR AS DIRECTED 02/23/15  Yes Burnis Medin, MD  [facility] DELICA LANCETS FINE MISC Use to test blood sugar 2-3 times daily 02/01/15  Yes Burnis Medin, MD  polyethylene glycol [facility]) packet Take 17 g by mouth daily. 06/14/14  Yes Amy D Clegg, NP  potassium chloride SA (K-DUR,KLOR-CON) 20 MEQ tablet Take 2 tablets (40 mEq total) by mouth daily. 05/23/15  Yes Larey Dresser, MD  pramipexole (MIRAPEX) 0.5 MG tablet Take 2-3 hours before sleep for restless leg. 06/21/15  Yes Burnis Medin, MD  saxagliptin HCl (ONGLYZA) 5 MG TABS tablet Take 1 tablet (5 mg total) by mouth daily. 06/21/15  Yes Burnis Medin, MD  silodosin (RAPAFLO) 8 MG CAPS capsule Take 1 capsule (8 mg total) by mouth daily with breakfast. 07/21/15  Yes Burnis Medin, MD  spironolactone (ALDACTONE) 25 MG tablet Take 1 tablet (25 mg total) by mouth daily. 06/21/15  Yes Burnis Medin, MD  torsemide (DEMADEX) 20 MG tablet Take 3 tablets (60 mg total) by mouth daily. Take extra as directed for wt gain 06/23/15  Yes Larey Dresser, MD    No Known Allergies  Past Surgical History  Procedure Laterality Date  . Cataract extraction w/ intraocular lens  implant, bilateral Bilateral 1980's  . Cardiac defibrillator placement  2005  . Ventricular resection / repair aneurysm Left 2005  . Bi-ventricular implantable cardioverter defibrillator  (crt-d)  05/20/2013    STJ [name] CRTD upgrade by Dr Caryl Comes  . Cholecystectomy  1982  . Appendectomy  1982  . Coronary artery bypass graft  2005    "CABG X3"  . Mohs surgery  Right ~ 2007    "face"  . Cardioversion N/A 07/20/2013    Procedure: CARDIOVERSION;  Surgeon: Deboraha Sprang, MD;  Location: [place];  Service: Cardiovascular;  Laterality: N/A;  . Cardioversion N/A 09/28/2013    Procedure: CARDIOVERSION;  Surgeon: Lelon Perla, MD;  Location: [place];  Service: Cardiovascular;  Laterality: N/A;  . Implantable cardioverter defibrillator (icd) generator change N/A 05/20/2013    Procedure: ICD GENERATOR CHANGE;  Surgeon: Deboraha Sprang, MD;  Location[facility] CATH LAB;  Service: Cardiovascular;  Laterality: N/A;  . Bi-ventricular implantable cardioverter defibrillator upgrade N/A 05/20/2013    Procedure: BI-VENTRICULAR IMPLANTABLE CARDIOVERTER DEFIBRILLATOR UPGRADE;  Surgeon: Deboraha Sprang, MD;  Location: [facility] C[facility] LAB;  Service: Cardiovascular;  Laterality: N/A;  . Cardioversion N/A 05/20/2014    Procedure: CARDIOVERSION;  Surgeon: Pixie Casino, MD;  [facility] ENDOSCOPY;  Service: Cardiovascular;  Laterality: N/A;    Social History  Substance Use Topics  . Smoking status:  Former Smoker -- 1.00 packs/day for 40 years    Types: Cigarettes    Quit date: 03/19/1978  . Smokeless tobacco: Current User    Types: Chew  . Alcohol Use: No    Family History  Problem Relation Age of Onset  . COPD Father     Medication list has been reviewed and updated.  Physical Examination:  Physical Exam  Constitutional: He is oriented to person, place, and time. He appears well-developed and well-nourished. No distress.  HENT:  Head: Normocephalic and atraumatic.  Right Ear: Hearing, tympanic membrane, external ear and ear canal normal.  Left Ear: Hearing, tympanic membrane, external ear and ear canal normal.  Nose: Nose normal.  Mouth/Throat: Uvula is midline, oropharynx is clear and moist and mucous membranes are normal.  Eyes: Conjunctivae and lids are normal. Right eye exhibits no discharge. Left eye exhibits no discharge. No scleral icterus.  Cardiovascular: Normal rate, regular rhythm, normal heart sounds and normal pulses.   No murmur heard. Pulmonary/Chest: Effort normal and breath sounds normal. No respiratory distress. He has no wheezes. He has no rhonchi. He has no rales.  Musculoskeletal: Normal range of motion.  Lymphadenopathy:       Head (right side): No submental, no submandibular and no tonsillar adenopathy present.       Head (left side): No submental, no submandibular and no tonsillar adenopathy present.    He has no cervical adenopathy.  Neurological: He is alert and oriented to person, place, and time.  Skin: Skin is warm, dry and intact. No lesion and no rash noted.  Psychiatric: He has a normal mood and affect. His speech is normal and behavior is normal. Thought content normal.   BP 134/66 mmHg  Pulse 60  Temp(Src) 98.4 F (36.9 C) (Oral)  Resp 15  Ht 6' (1.829 m)  Wt 199 lb 9.6 oz (90.538 kg)  BMI 27.06 kg/m2  SpO2 93%  Assessment and Plan:  1. Viral URI Likely viral URI. Exam reassuring. Vitals generally reassuring -- last pulse ox  recorded 1 year ago, 95%. Treat with tessalon and fluids. If not getting better in 3 days, may fill doxy. Return here if symptoms worsen or follow up with PCP. - benzonatate (TESSALON) 100 MG capsule; Take 1-2 capsules (100-200 mg total) by mouth 3 (three) times daily as needed for cough.  Dispense: 40 capsule; Refill: 0 - doxycycline (VIBRAMYCIN) 100 MG capsule; Take 1 capsule (100 mg total) by mouth 2 (two) times daily. AVOID EXCESS SUN EXPOSURE WHILE ON  THIS MEDICATION  Dispense: 14 capsule; Refill: 0   Benjaman Pott. Drenda Freeze, MHS [facility]  07/30/2015

## 2015-07-29 NOTE — Patient Instructions (Addendum)
Drink plenty of water (64 oz/day) and get plenty of rest. Use tessalon for your cough. If your symptoms are not getting better in 3 days, you may fill the antibiotic If your symptoms are not improving in 1 week or if at any time your symptoms worsen, return to clinic or follow up with your PCP     IF you received an x-ray today, you will receive an invoice from [facility]. Please contact [facility]iology at [phone] with questions or concerns regarding your invoice.   IF you received labwork today, you will receive an invoice from [employer]. Please contact [facility] at [phone] with questions or concerns regarding your invoice.   Our billing staff will not be able to assist you with questions regarding bills from these companies.  You will be contacted with the lab results as soon as they are available. The fastest way to get your results is to activate your My Chart account. Instructions are located on the last page of this paperwork. If you have not heard from [place] regarding the results in 2 weeks, please contact this office.

## 2015-08-03 ENCOUNTER — Telehealth: Payer: Self-pay | Admitting: Internal Medicine

## 2015-08-03 NOTE — Telephone Encounter (Signed)
Pt request refill of the following:   glucose blood (ONE TOUCH TEST STRIPS) test strip   Phamacy:  [place]

## 2015-08-04 MED ORDER — GLUCOSE BLOOD VI STRP: ORAL_STRIP | Status: DC

## 2015-08-04 NOTE — Telephone Encounter (Signed)
Sent to the pharmacy by e-scribe. 

## 2015-08-12 ENCOUNTER — Telehealth: Payer: Self-pay | Admitting: *Deleted

## 2015-08-12 NOTE — Telephone Encounter (Signed)
Left message for patient to call and schedule shoe measurement appointment

## 2015-08-16 ENCOUNTER — Other Ambulatory Visit: Payer: Self-pay | Admitting: Internal Medicine

## 2015-08-18 ENCOUNTER — Ambulatory Visit (INDEPENDENT_AMBULATORY_CARE_PROVIDER_SITE_OTHER): Payer: Medicare Other | Admitting: Podiatry

## 2015-08-18 ENCOUNTER — Encounter: Payer: Self-pay | Admitting: Podiatry

## 2015-08-18 DIAGNOSIS — B351 Tinea unguium: Secondary | ICD-10-CM | POA: Diagnosis not present

## 2015-08-18 DIAGNOSIS — M79676 Pain in unspecified toe(s): Secondary | ICD-10-CM

## 2015-08-18 DIAGNOSIS — E1140 Type 2 diabetes mellitus with diabetic neuropathy, unspecified: Secondary | ICD-10-CM

## 2015-08-18 DIAGNOSIS — Q828 Other specified congenital malformations of skin: Secondary | ICD-10-CM

## 2015-08-18 DIAGNOSIS — M216X9 Other acquired deformities of unspecified foot: Secondary | ICD-10-CM

## 2015-08-18 NOTE — Progress Notes (Signed)
Patient ID: [name], male   DOB: [birth year], 82 y.o.   MRN: [id number] Complaint:  Visit Type: Patient returns to my office for continued preventative foot care services. Complaint: Patient states" my nails have grown long and thick and become painful to walk and wear shoes" Patient has been diagnosed with DM with neuropathy.[name] He presents for preventative foot care services. No changes to ROS  Podiatric Exam: Vascular: dorsalis pedis and posterior tibial pulses are palpable bilateral. Capillary return is immediate. Temperature gradient is WNL. Skin turgor WNL  Sensorium: Diminished  Semmes Weinstein monofilament test. Normal tactile sensation bilaterally. Nail Exam: Pt has thick disfigured discolored nails with subungual debris noted bilateral entire nail hallux through fifth toenails Ulcer Exam: There is no evidence of ulcer or pre-ulcerative changes or infection. Orthopedic Exam: Muscle tone and strength are WNL. No limitations in general ROM. No crepitus or effusions noted. Foot type and digits show no abnormalities. Bony prominences are unremarkable. Asymptomatic HAV B/L. Skin:  Porokeratosis sub 5th metatarsal left foot. No infection or ulcers.  Asymptomatic Callus both feet.  Diagnosis:  Tinea unguium, Pain in right toe, pain in left toes, Porokeratosis  Treatment & Plan Procedures and Treatment: Consent by patient was obtained for treatment procedures. The patient understood the discussion of treatment and procedures well. All questions were answered thoroughly reviewed. Debridement of mycotic and hypertrophic toenails, 1 through 5 bilateral and clearing of subungual debris. No ulceration, no infection noted.  Return Visit-Office Procedure: Patient instructed to return to the office for a follow up visit 3 months for continued evaluation and treatment. Initiate diabetic shoe paperwork.  Gardiner Barefoot DPM

## 2015-09-05 ENCOUNTER — Other Ambulatory Visit: Payer: Self-pay

## 2015-09-05 DIAGNOSIS — I2510 Atherosclerotic heart disease of native coronary artery without angina pectoris: Secondary | ICD-10-CM

## 2015-09-05 MED ORDER — APIXABAN 2.5 MG PO TABS: 2.5000 mg | ORAL_TABLET | Freq: Two times a day (BID) | ORAL | Status: DC

## 2015-09-05 MED ORDER — AMIODARONE HCL 200 MG PO TABS: 200.0000 mg | ORAL_TABLET | Freq: Every day | ORAL | Status: DC

## 2015-09-05 MED ORDER — POTASSIUM CHLORIDE CRYS ER 20 MEQ PO TBCR: 40.0000 meq | EXTENDED_RELEASE_TABLET | Freq: Every day | ORAL | Status: DC

## 2015-09-05 MED ORDER — ATORVASTATIN CALCIUM 40 MG PO TABS: 40.0000 mg | ORAL_TABLET | Freq: Every day | ORAL | Status: DC

## 2015-09-08 ENCOUNTER — Other Ambulatory Visit: Payer: Self-pay | Admitting: Internal Medicine

## 2015-09-08 NOTE — Telephone Encounter (Signed)
Please advise 
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
## 2015-09-21 ENCOUNTER — Ambulatory Visit (INDEPENDENT_AMBULATORY_CARE_PROVIDER_SITE_OTHER): Payer: Medicare Other | Admitting: Internal Medicine

## 2015-09-21 ENCOUNTER — Encounter: Payer: Self-pay | Admitting: Internal Medicine

## 2015-09-21 VITALS — BP 114/50 | Temp 97.9°F | Ht 72.0 in | Wt 200.7 lb

## 2015-09-21 DIAGNOSIS — M62830 Muscle spasm of back: Secondary | ICD-10-CM

## 2015-09-21 DIAGNOSIS — E1140 Type 2 diabetes mellitus with diabetic neuropathy, unspecified: Secondary | ICD-10-CM | POA: Diagnosis not present

## 2015-09-21 DIAGNOSIS — IMO0001 Reserved for inherently not codable concepts without codable children: Secondary | ICD-10-CM

## 2015-09-21 DIAGNOSIS — Z87898 Personal history of other specified conditions: Secondary | ICD-10-CM | POA: Diagnosis not present

## 2015-09-21 DIAGNOSIS — Z789 Other specified health status: Secondary | ICD-10-CM

## 2015-09-21 DIAGNOSIS — R54 Age-related physical debility: Secondary | ICD-10-CM | POA: Diagnosis not present

## 2015-09-21 DIAGNOSIS — Z794 Long term (current) use of insulin: Secondary | ICD-10-CM

## 2015-09-21 DIAGNOSIS — I255 Ischemic cardiomyopathy: Secondary | ICD-10-CM

## 2015-09-21 LAB — POCT GLYCOSYLATED HEMOGLOBIN (HGB A1C): Hemoglobin A1C: 8

## 2015-09-21 MED ORDER — GABAPENTIN 100 MG PO CAPS: 100.0000 mg | ORAL_CAPSULE | Freq: Three times a day (TID) | ORAL | Status: DC

## 2015-09-21 NOTE — Patient Instructions (Signed)
try taking gabapentin   Up to 3 x per day and see if helps back .  Let us know if  You want to try Physical therapy to  help with the muscle spasms.   Limit    Sweets to  1/2 c   Monitor .   Can increase the insulin  2 units to  19  units     Plan   ROV in 3-4 months .

## 2015-09-26 DIAGNOSIS — C44629 Squamous cell carcinoma of skin of left upper limb, including shoulder: Secondary | ICD-10-CM | POA: Diagnosis not present

## 2015-09-26 DIAGNOSIS — L570 Actinic keratosis: Secondary | ICD-10-CM | POA: Diagnosis not present

## 2015-09-26 DIAGNOSIS — C44311 Basal cell carcinoma of skin of nose: Secondary | ICD-10-CM | POA: Diagnosis not present

## 2015-09-26 DIAGNOSIS — Z85828 Personal history of other malignant neoplasm of skin: Secondary | ICD-10-CM | POA: Diagnosis not present

## 2015-09-27 ENCOUNTER — Ambulatory Visit (HOSPITAL_COMMUNITY)
Admission: RE | Admit: 2015-09-27 | Discharge: 2015-09-27 | Disposition: A | Discharge status: Home / Self Care | Payer: Medicare Other | Source: Ambulatory Visit | Attending: Cardiology | Admitting: Cardiology

## 2015-09-27 ENCOUNTER — Encounter (HOSPITAL_COMMUNITY): Payer: Self-pay

## 2015-09-27 VITALS — BP 104/56 | HR 64 | Wt 196.2 lb

## 2015-09-27 DIAGNOSIS — I4891 Unspecified atrial fibrillation: Secondary | ICD-10-CM

## 2015-09-27 DIAGNOSIS — G2581 Restless legs syndrome: Secondary | ICD-10-CM | POA: Insufficient documentation

## 2015-09-27 DIAGNOSIS — I6529 Occlusion and stenosis of unspecified carotid artery: Secondary | ICD-10-CM | POA: Insufficient documentation

## 2015-09-27 DIAGNOSIS — I5022 Chronic systolic (congestive) heart failure: Secondary | ICD-10-CM | POA: Diagnosis not present

## 2015-09-27 DIAGNOSIS — E1122 Type 2 diabetes mellitus with diabetic chronic kidney disease: Secondary | ICD-10-CM | POA: Insufficient documentation

## 2015-09-27 DIAGNOSIS — Z951 Presence of aortocoronary bypass graft: Secondary | ICD-10-CM | POA: Diagnosis not present

## 2015-09-27 DIAGNOSIS — G4733 Obstructive sleep apnea (adult) (pediatric): Secondary | ICD-10-CM | POA: Insufficient documentation

## 2015-09-27 DIAGNOSIS — E785 Hyperlipidemia, unspecified: Secondary | ICD-10-CM | POA: Diagnosis not present

## 2015-09-27 DIAGNOSIS — R29818 Other symptoms and signs involving the nervous system: Secondary | ICD-10-CM

## 2015-09-27 DIAGNOSIS — I739 Peripheral vascular disease, unspecified: Secondary | ICD-10-CM

## 2015-09-27 DIAGNOSIS — Z7901 Long term (current) use of anticoagulants: Secondary | ICD-10-CM | POA: Diagnosis not present

## 2015-09-27 DIAGNOSIS — I2510 Atherosclerotic heart disease of native coronary artery without angina pectoris: Secondary | ICD-10-CM | POA: Insufficient documentation

## 2015-09-27 DIAGNOSIS — I130 Hypertensive heart and chronic kidney disease with heart failure and stage 1 through stage 4 chronic kidney disease, or unspecified chronic kidney disease: Secondary | ICD-10-CM | POA: Diagnosis not present

## 2015-09-27 DIAGNOSIS — M1990 Unspecified osteoarthritis, unspecified site: Secondary | ICD-10-CM | POA: Insufficient documentation

## 2015-09-27 DIAGNOSIS — N183 Chronic kidney disease, stage 3 unspecified: Secondary | ICD-10-CM

## 2015-09-27 DIAGNOSIS — F329 Major depressive disorder, single episode, unspecified: Secondary | ICD-10-CM | POA: Diagnosis not present

## 2015-09-27 DIAGNOSIS — I255 Ischemic cardiomyopathy: Secondary | ICD-10-CM | POA: Diagnosis not present

## 2015-09-27 DIAGNOSIS — Z794 Long term (current) use of insulin: Secondary | ICD-10-CM | POA: Insufficient documentation

## 2015-09-27 DIAGNOSIS — I480 Paroxysmal atrial fibrillation: Secondary | ICD-10-CM | POA: Insufficient documentation

## 2015-09-27 DIAGNOSIS — R2689 Other abnormalities of gait and mobility: Secondary | ICD-10-CM

## 2015-09-27 LAB — COMPREHENSIVE METABOLIC PANEL
ALBUMIN: 3.5 g/dL (ref 3.5–5.0)
ALT: 21 U/L (ref 17–63)
AST: 23 U/L (ref 15–41)
Alkaline Phosphatase: 62 U/L (ref 38–126)
Anion gap: 8 (ref 5–15)
BUN: 38 mg/dL — AB (ref 6–20)
CHLORIDE: 105 mmol/L (ref 101–111)
CO2: 27 mmol/L (ref 22–32)
CREATININE: 2.17 mg/dL — AB (ref 0.61–1.24)
Calcium: 9.1 mg/dL (ref 8.9–10.3)
GFR calc Af Amer: 30 mL/min — ABNORMAL LOW (ref >60)
GFR calc non Af Amer: 26 mL/min — ABNORMAL LOW (ref >60)
GLUCOSE: 113 mg/dL — AB (ref 65–99)
POTASSIUM: 4.9 mmol/L (ref 3.5–5.1)
SODIUM: 140 mmol/L (ref 135–145)
Total Bilirubin: 0.9 mg/dL (ref 0.3–1.2)
Total Protein: 6.2 g/dL — ABNORMAL LOW (ref 6.5–8.1)

## 2015-09-27 LAB — DIGOXIN LEVEL: Digoxin Level: 1.2 ng/mL (ref 0.8–2.0)

## 2015-09-27 LAB — TSH: TSH: 1.071 u[IU]/mL (ref 0.350–4.500)

## 2015-09-27 NOTE — Patient Instructions (Signed)
Labs today  Your physician has requested that you have a lower extremity arterial exercise duplex. During this test, exercise and ultrasound are used to evaluate arterial blood flow in the legs. Allow one hour for this exam. There are no restrictions or special instructions.  You have been referred to Physical Therapy  We will contact you in 6 months to schedule your next appointment.

## 2015-09-27 NOTE — Progress Notes (Signed)
Patient ID: [name], male   DOB: [birth year], 82 y.o.   MRN: [id number] PCP: Dr. Regis Bill Cardiology: Dr. [name]  82 yo with history of CAD s/p CABG, ischemic cardiomyopathy, and paroxysmal atrial fibrillation presents for followup.  Patient was admitted in 2/16 with CHF and diuresed, he was sent home on Lasix 60 mg bid.  He missed 3 days of the pm Lasix dose and was re-admitted on 05/18/14 with acute on chronic systolic CHF with dyspnea and hypoxemia. He was diuresed with Lasix gtt and metolazone.  While in the hospital, he went into atrial fibrillation with RVR requiring cardioversion.  Creatinine was elevated and ramipril was stopped.  We had to cut back on Coreg due to hypotension and low output.  He was begun on low dose digoxin.    He returns for followup today.  Weight is down 6 lbs.  No chest pain.  No dyspnea on flat ground, develops leg fatigue walking long distances.  He can climb a flight of steps without problems.  He can get around the grocery store with no problems.  No orthopnea/PND.  No lightheadedness. No BRBPR or melena. Main concern has been chronic leg weakness.  He has had several falls, seems to be due to tripping.    Corevue: >99% BiV pacing, impedance stable. No atrial fibrillation.   Labs (3/16): K 4.2, creatinine 2.26, digoxin < 0.2 Labs (05/31/2014): TSH 2.3 Dig level 0.6  Labs (06/14/2014): K 4.5 Creatinine 2.14, LDL 28, HDL 46 Labs (6/16): K 4.5, creatinine 2.08, BNP 712 Labs (7/16): K 4.1, creatinine 2.18, HCT 36.8, LFTs normal, digoxin 0.9 Labs (10/16): LDL 26, HDL 38, TSH normal, LFTs normal Labs (12/16): K 4.6, creatinine 2 Labs (2/17): LFTs normal, digoxin 1.1 Labs (4/17): K 4.7, creatinine 1.97, BUN 50, hgb 12.8, TSH normal, digoxin 0.8, LDL 29  PMH: 1. CAD: S/p CABG 2005 with LIMA-Diagonal, SVG-LAD, SVG-LCx, SVG-RCA.  Cardiolite in 2/15 with EF 19%, no ischemia.  2. Ischemic cardiomyopathy: Echo (2/16) with EF 20-25%, severe LV dilation with RWMAs, moderate  AI, moderate MR, moderately decreased RV systolic function.  St Jude CRT-D device.  3. Atrial fibrillation: Paroxysmal.  DCCV 3/16.  4. CKD 5. Carotid stenosis: Carotid dopplers (1/16) with 60-79% RICA stenosis.  Carotids (1/17) with bilateral 40-59% ICA stenosis.  6. HTN 7. Hyperlipidemia 8. Restless leg syndrome. 9. Type 2 diabetes. 10. OA 11. Depression 12. H/o CCY 29. H/o appy 14. OSA: Uses CPAP.   SH: Married, quit smoking 1980, [place] [employer] vet  FH: CAD  ROS: All systems reviewed and negative except as per HPI.   Current Outpatient Prescriptions  Medication Sig Dispense Refill  . acetaminophen (TYLENOL) 500 MG tablet Take 500 mg by mouth daily as needed for mild pain.    [name] amiodarone (PACERONE) 200 MG tablet Take 1 tablet (200 mg total) by mouth daily. 90 tablet 1  . apixaban (ELIQUIS) 2.5 MG TABS tablet Take 1 tablet (2.5 mg total) by mouth 2 (two) times daily. 180 tablet 1  . atorvastatin (LIPITOR) 40 MG tablet Take 1 tablet (40 mg total) by mouth daily. 90 tablet 1  . carvedilol (COREG) 6.25 MG tablet Take 1.5 tablets (9.375 mg total) by mouth 2 (two) times daily with a meal. 90 tablet 3  . DIGOX 125 MCG tablet TAKE 1/2 TABLET(0.063 MG) BY MOUTH DAILY 15 tablet 5  . escitalopram (LEXAPRO) 10 MG tablet Take 1 tablet (10 mg total) by mouth daily. 90 tablet 3  . gabapentin (NEURONTIN)  100 MG capsule Take 1 capsule (100 mg total) by mouth 3 (three) times daily. Can increase as directed 90 capsule 3  . glipiZIDE (GLUCOTROL) 10 MG tablet Take 1 tablet (10 mg total) by mouth daily. 180 tablet 1  . glucose blood (ONE TOUCH TEST STRIPS) test strip Use to test blood sugar 2-3 times daily 300 each 1  . hydrALAZINE (APRESOLINE) 25 MG tablet Take 1 tablet (25 mg total) by mouth every 8 (eight) hours. 360 tablet 3  . Insulin Detemir (LEVEMIR) 100 UNIT/ML Pen Inject 18 Units into the skin at bedtime. as directed (Patient taking differently: Inject 17 Units into the skin at bedtime. ) 2 pen 0   . isosorbide mononitrate (IMDUR) 30 MG 24 hr tablet Take 1 tablet (30 mg total) by mouth daily. 90 tablet 3  . ONETOUCH DELICA LANCETS FINE MISC Use to test blood sugar 2-3 times daily 300 each 1  . polyethylene glycol (MIRALAX) packet Take 17 g by mouth daily. 14 each 0  . potassium chloride SA (K-DUR,KLOR-CON) 20 MEQ tablet Take 2 tablets (40 mEq total) by mouth daily. 180 tablet 3  . pramipexole (MIRAPEX) 0.5 MG tablet Take 2-3 hours before sleep for restless leg. 90 tablet 1  . saxagliptin HCl (ONGLYZA) 5 MG TABS tablet Take 1 tablet (5 mg total) by mouth daily. 90 tablet 3  . silodosin (RAPAFLO) 8 MG CAPS capsule Take 1 capsule (8 mg total) by mouth daily with breakfast. 90 capsule 1  . spironolactone (ALDACTONE) 25 MG tablet Take 1 tablet (25 mg total) by mouth daily. 90 tablet 3  . torsemide (DEMADEX) 20 MG tablet Take 3 tablets (60 mg total) by mouth daily. Take extra as directed for wt gain 120 tablet 0  . hydrocortisone 2.5 % cream Apply 1 application topically as directed. Reported on 09/27/2015  2   No current facility-administered medications for this encounter.   BP 104/56 mmHg  Pulse 64  Wt 196 lb 4 oz (89.018 kg)  SpO2 97% General: NAD.  Neck: JVP 7 cm, no thyromegaly or thyroid nodule.  Lungs: Clear to auscultation bilaterally with normal respiratory effort. CV: Nondisplaced PMI.  Heart regular S1/S2, no S3/S4, 1/6 HSM LLSB.  No edema. Left carotid bruit.  Unable to palpate pedal pulses.  Abdomen: Soft, nontender, no hepatosplenomegaly,no abdominal distention.  Skin: Intact without lesions or rashes.  Neurologic: Alert and oriented x 3.  Psych: Normal affect. Extremities: No clubbing or cyanosis.  HEENT: Normal.   Assessment/Plan: 1. Chronic systolic CHF: Ischemic cardiomyopathy.  EF 20-25% by 2/16 echo with moderate RV dysfunction.  Stable NYHA class II symptoms. St Jude CRT-D device.  He is not volume overloaded on exam or by Corevue. - Continue torsemide 60 mg  daily.  Check BMET.     - Continue current hydralazine/Imdur  - Will leave off ACEI for now with elevated creatinine.  - Continue current spironolactone.  - Continue current digoxin.  Check level today.   - Continue Coreg to 9.375 mg bid, will not increase with recent falls.  -2. Atrial fibrillation: Paroxysmal.  No recent atrial fibrillation by device interrogation today.  Continue amiodarone and Eliquis (dosed at 2.5 mg bid with age and renal dysfunction).  Will need to make sure to have regular eye exam with amiodarone.  Check LFTs and TSH today.  3. CKD: Stage 3.  Will remain off ACEI for now.   4. CAD: No chest pain, stable.  Continue on statin.  He is not on  ASA 81 given stable CAD and Eliquis use.  Good lipids in 10/16.  5. Carotid stenosis: Repeat carotid dopplers 1/18.  6. PAD: Reports leg weakness, unable to palpate pedal pulses.  I will arrange for peripheral arterial dopplers.   7. Falls: I will arrange for PT for balance training.   Follow up in 2 months.   [name] 09/27/2015

## 2015-09-28 ENCOUNTER — Telehealth (HOSPITAL_COMMUNITY): Payer: Self-pay | Admitting: *Deleted

## 2015-09-28 DIAGNOSIS — I5022 Chronic systolic (congestive) heart failure: Secondary | ICD-10-CM

## 2015-09-28 NOTE — Telephone Encounter (Signed)
Notes Recorded by Scarlette Calico, RN on 09/28/2015 at 12:42 PM Patient aware. He will come for dig level 7/14  Notes Recorded by Kerry Dory, CMA on 09/28/2015 at 10:17 AM [phone] [facility]) [facility] Notes Recorded by Larey Dresser, MD on 09/27/2015 at 9:18 PM Please arrange for repeat digoxin level, do in am before he takes his digoxin (trough). Notes Recorded by Scarlette Calico, RN on 09/27/2015 at 4:15 PM Labs reviewed by RN, will forward to MD for review

## 2015-09-28 NOTE — Telephone Encounter (Signed)
-----   Message from Larey Dresser, MD sent at 09/27/2015  9:18 PM EDT ----- Please arrange for repeat digoxin level, do in am before he takes his digoxin (trough).

## 2015-09-30 ENCOUNTER — Ambulatory Visit (HOSPITAL_COMMUNITY)
Admission: RE | Admit: 2015-09-30 | Discharge: 2015-09-30 | Disposition: A | Discharge status: Home / Self Care | Payer: Medicare Other | Source: Ambulatory Visit | Attending: Cardiology | Admitting: Cardiology

## 2015-09-30 DIAGNOSIS — I5022 Chronic systolic (congestive) heart failure: Secondary | ICD-10-CM | POA: Diagnosis not present

## 2015-09-30 LAB — DIGOXIN LEVEL: DIGOXIN LVL: 0.6 ng/mL — AB (ref 0.8–2.0)

## 2015-10-03 ENCOUNTER — Other Ambulatory Visit (HOSPITAL_COMMUNITY): Payer: Self-pay | Admitting: Cardiology

## 2015-10-03 ENCOUNTER — Ambulatory Visit (INDEPENDENT_AMBULATORY_CARE_PROVIDER_SITE_OTHER): Payer: Medicare Other | Admitting: Pulmonary Disease

## 2015-10-03 ENCOUNTER — Ambulatory Visit: Payer: Medicare Other | Admitting: Rehabilitation

## 2015-10-03 ENCOUNTER — Encounter: Payer: Self-pay | Admitting: Pulmonary Disease

## 2015-10-03 VITALS — BP 104/62 | HR 59 | Ht 74.0 in | Wt 200.2 lb

## 2015-10-03 DIAGNOSIS — Z9989 Dependence on other enabling machines and devices: Principal | ICD-10-CM

## 2015-10-03 DIAGNOSIS — R0989 Other specified symptoms and signs involving the circulatory and respiratory systems: Secondary | ICD-10-CM

## 2015-10-03 DIAGNOSIS — I255 Ischemic cardiomyopathy: Secondary | ICD-10-CM | POA: Diagnosis not present

## 2015-10-03 DIAGNOSIS — G4733 Obstructive sleep apnea (adult) (pediatric): Secondary | ICD-10-CM

## 2015-10-03 DIAGNOSIS — I739 Peripheral vascular disease, unspecified: Secondary | ICD-10-CM

## 2015-10-03 NOTE — Progress Notes (Signed)
Current Outpatient Prescriptions on File Prior to Visit  Medication Sig  . acetaminophen (TYLENOL) 500 MG tablet Take 500 mg by mouth daily as needed for mild pain.  [name] amiodarone (PACERONE) 200 MG tablet Take 1 tablet (200 mg total) by mouth daily.  [name] apixaban (ELIQUIS) 2.5 MG TABS tablet Take 1 tablet (2.5 mg total) by mouth 2 (two) times daily.  [name] atorvastatin (LIPITOR) 40 MG tablet Take 1 tablet (40 mg total) by mouth daily.  . carvedilol (COREG) 6.25 MG tablet Take 1.5 tablets (9.375 mg total) by mouth 2 (two) times daily with a meal.  . DIGOX 125 MCG tablet TAKE 1/2 TABLET(0.063 MG) BY MOUTH DAILY  . escitalopram (LEXAPRO) 10 MG tablet Take 1 tablet (10 mg total) by mouth daily.  [name] gabapentin (NEURONTIN) 100 MG capsule Take 1 capsule (100 mg total) by mouth 3 (three) times daily. Can increase as directed  . glipiZIDE (GLUCOTROL) 10 MG tablet Take 1 tablet (10 mg total) by mouth daily.  [name] glucose blood (ONE TOUCH TEST STRIPS) test strip Use to test blood sugar 2-3 times daily  . hydrALAZINE (APRESOLINE) 25 MG tablet Take 1 tablet (25 mg total) by mouth every 8 (eight) hours.  . hydrocortisone 2.5 % cream Apply 1 application topically as directed. Reported on 09/27/2015  . Insulin Detemir (LEVEMIR) 100 UNIT/ML Pen Inject 18 Units into the skin at bedtime. as directed (Patient taking differently: Inject 17 Units into the skin at bedtime. )  . isosorbide mononitrate (IMDUR) 30 MG 24 hr tablet Take 1 tablet (30 mg total) by mouth daily.  Glory Rosebush DELICA LANCETS FINE MISC Use to test blood sugar 2-3 times daily  . polyethylene glycol (MIRALAX) packet Take 17 g by mouth daily.  . potassium chloride SA (K-DUR,KLOR-CON) 20 MEQ tablet Take 2 tablets (40 mEq total) by mouth daily.  . pramipexole (MIRAPEX) 0.5 MG tablet Take 2-3 hours before sleep for restless leg.  . saxagliptin HCl (ONGLYZA) 5 MG TABS tablet Take 1 tablet (5 mg total) by mouth daily.  . silodosin (RAPAFLO) 8 MG CAPS capsule Take 1  capsule (8 mg total) by mouth daily with breakfast.  . spironolactone (ALDACTONE) 25 MG tablet Take 1 tablet (25 mg total) by mouth daily.  [name] torsemide (DEMADEX) 20 MG tablet Take 3 tablets (60 mg total) by mouth daily. Take extra as directed for wt gain   No current facility-administered medications on file prior to visit.    Chief Complaint  Patient presents with  . Follow-up    Wears CPAP nightly. Denies problems with mask or pressure. DME: [facility]    Sleep tests HST 07/17/09 >> AHI 8.4, RDI 13.9 CPAP 07/05/15 to 10/02/15 >> used on 90 of 90 nights with average 7 hrs 29 min.  Average AHI 5.1 with CPAP 7 cm H2O  Cardiac tests Echo 07/12/15 >> mild LVH, EF 123456, grade 1 diastolic dysfx, mild AS, mild MR  Past medical history CVA, ischemic CM s/p AICD, A fib, HLD, HTN, RLS, CAD, DM, Depression, autoimmune hemolytic anemia  Past surgical history, Family history, Social history, Allergies reviewed.  Vital signs BP 104/62 mmHg  Pulse 59  Ht 6\' 2"  (1.88 m)  Wt 200 lb 3.2 oz (90.81 kg)  BMI 25.69 kg/m2  SpO2 96%   History of Present Illness: [name] is a 82 y.o. male with OSA.  He got a new mask >> works better.  Full face mask.  Gets 7.5 hrs sleep.  Feels rested.  Wakes up few times to use bathroom.    Physical Exam:  General - No distress ENT - No sinus tenderness, no oral exudate, no LAN Cardiac - s1s2 regular, no murmur Chest - No wheeze/rales/dullness Back - No focal tenderness Abd - Soft, non-tender Ext - No edema Neuro - Normal strength Skin - No rashes Psych - normal mood, and behavior   Assessment/Plan:  Obstructive sleep apnea. - He is compliant with therapy and reports benefit. - continue CPAP 7 cm H2O - he needs new DME company >> was told [facility] [employer] could no longer provide his supplies   Patient Instructions  Will arrange for new medical supply company for CPAP supplies  Follow up in 1 year    Chesley Mires, MD [place]  Pulmonary/Critical Care/Sleep Pager:  [phone] 10/03/2015, 11:59 AM

## 2015-10-03 NOTE — Patient Instructions (Signed)
Will arrange for new medical supply company for CPAP supplies  Follow up in 1 year 

## 2015-10-10 ENCOUNTER — Other Ambulatory Visit (HOSPITAL_COMMUNITY): Payer: Self-pay | Admitting: Cardiology

## 2015-10-10 ENCOUNTER — Ambulatory Visit (HOSPITAL_COMMUNITY)
Admission: RE | Admit: 2015-10-10 | Discharge: 2015-10-10 | Disposition: A | Discharge status: Home / Self Care | Payer: Medicare Other | Source: Ambulatory Visit | Attending: Cardiovascular Disease | Admitting: Cardiovascular Disease

## 2015-10-10 DIAGNOSIS — I2510 Atherosclerotic heart disease of native coronary artery without angina pectoris: Secondary | ICD-10-CM | POA: Diagnosis not present

## 2015-10-10 DIAGNOSIS — R0989 Other specified symptoms and signs involving the circulatory and respiratory systems: Secondary | ICD-10-CM | POA: Diagnosis not present

## 2015-10-10 DIAGNOSIS — Z951 Presence of aortocoronary bypass graft: Secondary | ICD-10-CM | POA: Diagnosis not present

## 2015-10-10 DIAGNOSIS — I10 Essential (primary) hypertension: Secondary | ICD-10-CM | POA: Diagnosis not present

## 2015-10-10 DIAGNOSIS — Z87891 Personal history of nicotine dependence: Secondary | ICD-10-CM | POA: Diagnosis not present

## 2015-10-10 DIAGNOSIS — I739 Peripheral vascular disease, unspecified: Secondary | ICD-10-CM

## 2015-10-10 DIAGNOSIS — E785 Hyperlipidemia, unspecified: Secondary | ICD-10-CM | POA: Diagnosis not present

## 2015-10-10 DIAGNOSIS — E1151 Type 2 diabetes mellitus with diabetic peripheral angiopathy without gangrene: Secondary | ICD-10-CM | POA: Diagnosis not present

## 2015-10-11 ENCOUNTER — Ambulatory Visit (INDEPENDENT_AMBULATORY_CARE_PROVIDER_SITE_OTHER): Payer: Medicare Other | Admitting: *Deleted

## 2015-10-11 DIAGNOSIS — I255 Ischemic cardiomyopathy: Secondary | ICD-10-CM

## 2015-10-11 NOTE — Progress Notes (Signed)
Remote ICD transmission.   

## 2015-10-12 ENCOUNTER — Encounter: Payer: Self-pay | Admitting: Cardiology

## 2015-10-15 ENCOUNTER — Other Ambulatory Visit (HOSPITAL_COMMUNITY): Payer: Self-pay | Admitting: Internal Medicine

## 2015-10-17 LAB — CUP PACEART REMOTE DEVICE CHECK
Battery Remaining Percentage: 51 %
Battery Voltage: 2.92 V
Brady Statistic AP VP Percent: 99 %
Brady Statistic AS VP Percent: 1 %
Brady Statistic RA Percent Paced: 99 %
HIGH POWER IMPEDANCE MEASURED VALUE: 42 Ohm
HighPow Impedance: 42 Ohm
Implantable Lead Implant Date: 20051221
Implantable Lead Implant Date: 20150304
Implantable Lead Implant Date: 20150304
Lead Channel Impedance Value: 350 Ohm
Lead Channel Impedance Value: 380 Ohm
Lead Channel Impedance Value: 380 Ohm
Lead Channel Pacing Threshold Amplitude: 0.75 V
Lead Channel Pacing Threshold Pulse Width: 0.5 ms
Lead Channel Pacing Threshold Pulse Width: 1 ms
Lead Channel Sensing Intrinsic Amplitude: 12 mV
Lead Channel Setting Pacing Amplitude: 2 V
Lead Channel Setting Pacing Pulse Width: 0.5 ms
Lead Channel Setting Pacing Pulse Width: 1 ms
MDC IDC LEAD LOCATION: 753858
MDC IDC LEAD LOCATION: 753859
MDC IDC LEAD LOCATION: 753860
MDC IDC MSMT BATTERY REMAINING LONGEVITY: 26 mo
MDC IDC MSMT LEADCHNL LV PACING THRESHOLD AMPLITUDE: 2.25 V
MDC IDC MSMT LEADCHNL RA PACING THRESHOLD PULSEWIDTH: 0.5 ms
MDC IDC MSMT LEADCHNL RA SENSING INTR AMPL: 1.9 mV
MDC IDC MSMT LEADCHNL RV PACING THRESHOLD AMPLITUDE: 1.25 V
MDC IDC PG SERIAL: 7170478
MDC IDC SESS DTM: 20170725073348
MDC IDC SET LEADCHNL LV PACING AMPLITUDE: 3.25 V
MDC IDC SET LEADCHNL RV PACING AMPLITUDE: 2.5 V
MDC IDC SET LEADCHNL RV SENSING SENSITIVITY: 0.5 mV
MDC IDC STAT BRADY AP VS PERCENT: 1 %
MDC IDC STAT BRADY AS VS PERCENT: 1 %

## 2015-11-07 DIAGNOSIS — L814 Other melanin hyperpigmentation: Secondary | ICD-10-CM | POA: Diagnosis not present

## 2015-11-07 DIAGNOSIS — D485 Neoplasm of uncertain behavior of skin: Secondary | ICD-10-CM | POA: Diagnosis not present

## 2015-11-07 DIAGNOSIS — L570 Actinic keratosis: Secondary | ICD-10-CM | POA: Diagnosis not present

## 2015-11-07 DIAGNOSIS — Z85828 Personal history of other malignant neoplasm of skin: Secondary | ICD-10-CM | POA: Diagnosis not present

## 2015-11-16 ENCOUNTER — Ambulatory Visit: Payer: Medicare Other | Admitting: Podiatry

## 2015-11-23 ENCOUNTER — Ambulatory Visit (INDEPENDENT_AMBULATORY_CARE_PROVIDER_SITE_OTHER): Payer: Medicare Other | Admitting: Podiatry

## 2015-11-23 ENCOUNTER — Encounter: Payer: Self-pay | Admitting: Podiatry

## 2015-11-23 DIAGNOSIS — M79676 Pain in unspecified toe(s): Secondary | ICD-10-CM

## 2015-11-23 DIAGNOSIS — Q828 Other specified congenital malformations of skin: Secondary | ICD-10-CM | POA: Diagnosis not present

## 2015-11-23 DIAGNOSIS — B351 Tinea unguium: Secondary | ICD-10-CM

## 2015-11-23 DIAGNOSIS — M216X9 Other acquired deformities of unspecified foot: Secondary | ICD-10-CM

## 2015-11-23 DIAGNOSIS — E1140 Type 2 diabetes mellitus with diabetic neuropathy, unspecified: Secondary | ICD-10-CM | POA: Diagnosis not present

## 2015-11-23 NOTE — Progress Notes (Signed)
Patient ID: [name], male   DOB: 05/14/1928, 82 y.o.   MRN: [id number] Complaint:  Visit Type: Patient returns to my office for continued preventative foot care services. Complaint: Patient states" my nails have grown long and thick and become painful to walk and wear shoes" Patient has been diagnosed with DM with neuropathy.[name] He presents for preventative foot care services. No changes to ROS.  He also has painful callus under the outside ball of both feet.  Podiatric Exam: Vascular: dorsalis pedis and posterior tibial pulses are palpable bilateral. Capillary return is immediate. Temperature gradient is WNL. Skin turgor WNL  Sensorium: Diminished  Semmes Weinstein monofilament test. Normal tactile sensation bilaterally. Nail Exam: Pt has thick disfigured discolored nails with subungual debris noted bilateral entire nail hallux through fifth toenails Ulcer Exam: There is no evidence of ulcer or pre-ulcerative changes or infection. Orthopedic Exam: Muscle tone and strength are WNL. No limitations in general ROM. No crepitus or effusions noted. Foot type and digits show no abnormalities. Bony prominences are unremarkable. Asymptomatic HAV B/L. Skin:  Porokeratosis sub 5th metatarsal left foot. No infection or ulcers.  Asymptomatic Callus both feet.  Diagnosis:  Tinea unguium, Pain in right toe, pain in left toes, Porokeratosis  Treatment & Plan Procedures and Treatment: Consent by patient was obtained for treatment procedures. The patient understood the discussion of treatment and procedures well. All questions were answered thoroughly reviewed. Debridement of mycotic and hypertrophic toenails, 1 through 5 bilateral and clearing of subungual debris. No ulceration, no infection noted.  Return Visit-Office Procedure: Patient instructed to return to the office for a follow up visit 3 months for continued evaluation and treatment.   Gardiner Barefoot DPM

## 2015-12-07 ENCOUNTER — Encounter: Payer: Self-pay | Admitting: Internal Medicine

## 2015-12-20 ENCOUNTER — Other Ambulatory Visit (HOSPITAL_COMMUNITY): Payer: Self-pay | Admitting: Cardiology

## 2015-12-22 DIAGNOSIS — D480 Neoplasm of uncertain behavior of bone and articular cartilage: Secondary | ICD-10-CM | POA: Diagnosis not present

## 2015-12-23 DIAGNOSIS — D030 Melanoma in situ of lip: Secondary | ICD-10-CM | POA: Diagnosis not present

## 2015-12-28 ENCOUNTER — Telehealth: Payer: Self-pay | Admitting: Internal Medicine

## 2015-12-28 NOTE — Telephone Encounter (Signed)
I left a message for on Kim's identified voice mail to call back.

## 2015-12-28 NOTE — Telephone Encounter (Signed)
Pt takes Eliquis for afib with CHADS2 score of 4 (HTN, HF, age, and DM). Recommend holding Eliquis for 24 hours prior to procedure.

## 2015-12-28 NOTE — Telephone Encounter (Signed)
Call back from [place] at Dr. Eusebio Friendly office. Per Maudie Mercury, the patient is pending (not yet scheduled) for an excision of a lower lip melanoma and possible skin grafting.  The patient is currently on eliquis 2.5 mg BID for a-fib. He has a history of cerebrovascular disease. They are asking for recommendations for holding eliquis prior to the procedure. Per Maudie Mercury, Dr. Marla Roe has no special recommendations, for requested time to hold. Will forward to pharmacy and Dr. Caryl Comes to review.

## 2015-12-28 NOTE — Telephone Encounter (Signed)
[name] is calling to speak with with Dr.Klien nurse regarding this pt before she schedules a surgery for this pt

## 2015-12-29 NOTE — Telephone Encounter (Signed)
I left a message for [name] to call to obtain fax #.

## 2015-12-30 NOTE — Telephone Encounter (Signed)
[email or web address]

## 2015-12-30 NOTE — Telephone Encounter (Signed)
New Message  MD-Kim Dillingham voiced she is trying to treat a pt and needs to get in contact with someone and no one has reached out to her.  Please f/u with MD-Dillingham

## 2016-01-02 NOTE — Telephone Encounter (Signed)
Call back from [facility]- Fax # [phone].

## 2016-01-02 NOTE — Telephone Encounter (Signed)
Phone encounter faxed to Maudie Mercury at Dr. Arnetha Gula office at [phone]. Confirmation received.

## 2016-01-02 NOTE — Telephone Encounter (Signed)
I left a message for [name] to call for fax #.

## 2016-01-06 ENCOUNTER — Ambulatory Visit (INDEPENDENT_AMBULATORY_CARE_PROVIDER_SITE_OTHER): Payer: Medicare Other | Admitting: Internal Medicine

## 2016-01-06 ENCOUNTER — Encounter: Payer: Self-pay | Admitting: Internal Medicine

## 2016-01-06 VITALS — BP 120/58 | HR 63 | Temp 97.0°F | Wt 197.2 lb

## 2016-01-06 DIAGNOSIS — Z79899 Other long term (current) drug therapy: Secondary | ICD-10-CM

## 2016-01-06 DIAGNOSIS — IMO0002 Reserved for concepts with insufficient information to code with codable children: Secondary | ICD-10-CM

## 2016-01-06 DIAGNOSIS — E1165 Type 2 diabetes mellitus with hyperglycemia: Secondary | ICD-10-CM

## 2016-01-06 DIAGNOSIS — Z789 Other specified health status: Secondary | ICD-10-CM | POA: Diagnosis not present

## 2016-01-06 DIAGNOSIS — E1140 Type 2 diabetes mellitus with diabetic neuropathy, unspecified: Secondary | ICD-10-CM | POA: Diagnosis not present

## 2016-01-06 DIAGNOSIS — Z23 Encounter for immunization: Secondary | ICD-10-CM | POA: Diagnosis not present

## 2016-01-06 DIAGNOSIS — N289 Disorder of kidney and ureter, unspecified: Secondary | ICD-10-CM | POA: Diagnosis not present

## 2016-01-06 DIAGNOSIS — I255 Ischemic cardiomyopathy: Secondary | ICD-10-CM

## 2016-01-06 DIAGNOSIS — Z794 Long term (current) use of insulin: Secondary | ICD-10-CM

## 2016-01-06 LAB — POCT GLYCOSYLATED HEMOGLOBIN (HGB A1C): HEMOGLOBIN A1C: 9
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
## 2016-01-06 NOTE — Progress Notes (Signed)
Pre visit review using our clinic review tool, if applicable. No additional management support is needed unless otherwise documented below in the visit note. 

## 2016-01-06 NOTE — Patient Instructions (Signed)
Your diabetes is out of control as you are well aware. Increase the insulin Levemir to 24 units a day. This may still not be enough to control the diabetes.  Please record your blood sugars twice a day and plan follow-up visit in about a month. It is possible your blood sugars will come down because she may not be eating as much after your lip surgery. If for some reason your blood sugar in the morning is 110 or below you can go back down to 18 units a day. Until you come in. Continue with using cane and mobility assistance when you are out and about. Asked the foot doctor to look at your calluses to make sure they don't need other intervention.

## 2016-01-10 ENCOUNTER — Ambulatory Visit (INDEPENDENT_AMBULATORY_CARE_PROVIDER_SITE_OTHER): Payer: Medicare Other | Admitting: *Deleted

## 2016-01-10 DIAGNOSIS — I509 Heart failure, unspecified: Secondary | ICD-10-CM

## 2016-01-10 DIAGNOSIS — I255 Ischemic cardiomyopathy: Secondary | ICD-10-CM

## 2016-01-10 NOTE — Progress Notes (Signed)
Remote ICD transmission.   

## 2016-01-11 ENCOUNTER — Encounter: Payer: Self-pay | Admitting: Cardiology

## 2016-01-17 ENCOUNTER — Ambulatory Visit: Payer: Self-pay | Admitting: Plastic Surgery

## 2016-01-17 DIAGNOSIS — C439 Malignant melanoma of skin, unspecified: Secondary | ICD-10-CM

## 2016-01-18 NOTE — Pre-Procedure Instructions (Signed)
BATU CASSIN  01/18/2016      [facility] [place] [place] [facility] [address] [place] [address] Phone: [phone] Fax: [phone]  [facility] [place], [place] - [address] [address] Phone: [phone] Fax: [phone]    Your procedure is scheduled on November 6  Report to [facility] at [place].M.  Call this number if you have problems the morning of surgery:  [phone]   Remember:  Do not eat food or drink liquids after midnight.   Take these medicines the morning of surgery with A SIP OF WATER acetaminophen (TYLENOL), amiodarone (PACERONE), carvedilol (COREG),  DIGOX, escitalopram (LEXAPRO), gabapentin (NEURONTIN),  hydrALAZINE (APRESOLINE), isosorbide mononitrate (IMDUR), silodosin (RAPAFLO,   7 days prior to surgery STOP taking any Aspirin, Aleve, Naproxen, Ibuprofen, Motrin, Advil, Goody's, BC's, all herbal medications, fish oil, and all vitamins     How to Manage Your Diabetes Before and After Surgery  Why is it important to control my blood sugar before and after surgery? . Improving blood sugar levels before and after surgery helps healing and can limit problems. . A way of improving blood sugar control is eating a healthy diet by: o  Eating less sugar and carbohydrates o  Increasing activity/exercise o  Talking with your doctor about reaching your blood sugar goals . High blood sugars (greater than 180 mg/dL) can raise your risk of infections and slow your recovery, so you will need to focus on controlling your diabetes during the weeks before surgery. . Make sure that the doctor who takes care of your diabetes knows about your planned surgery including the date and location.  How do I manage my blood sugar before surgery? . Check your blood sugar at  least 4 times a day, starting 2 days before surgery, to make sure that the level is not too high or low. o Check your blood sugar the morning of your surgery when you wake up and every 2 hours until you get to the Short Stay unit. . If your blood sugar is less than 70 mg/dL, you will need to treat for low blood sugar: o Do not take insulin. o Treat a low blood sugar (less than 70 mg/dL) with  cup of clear juice (cranberry or apple), 4 glucose tablets, OR glucose gel. o Recheck blood sugar in 15 minutes after treatment (to make sure it is greater than 70 mg/dL). If your blood sugar is not greater than 70 mg/dL on recheck, call [phone] for further instructions. . Report your blood sugar to the short stay nurse when you get to Short Stay.  . If you are admitted to the hospital after surgery: o Your blood sugar will be checked by the staff and you will probably be given insulin after surgery (instead of oral diabetes medicines) to make sure you have good blood sugar levels. o The goal for blood sugar control after surgery is 80-180 mg/dL.    WHAT DO I DO ABOUT MY DIABETES MEDICATION?   [name] Do not take oral diabetes medicines (pills) the morning of surgery. glipiZIDE (GLUCOTROL)  . THE NIGHT BEFORE SURGERY, take ___16________ units of ____Levemir______insulin.      . The day of surgery, do not take other diabetes injectables, including Byetta (exenatide), Bydureon (exenatide ER), Victoza (liraglutide),  or Trulicity (dulaglutide).  . If your CBG is greater than 220 mg/dL, you may take  of your sliding scale (correction) dose of insulin.    Do not wear jewelry.  Do not wear lotions, powders, or cologne, or deoderant.  Men may shave face and neck.  Do not bring valuables to the hospital.  [facility] is not responsible for any belongings or valuables.  Contacts, dentures or bridgework may not be worn into surgery.  Leave your suitcase in the car.  After surgery it may be brought to your  room.  For patients admitted to the hospital, discharge time will be determined by your treatment team.  Patients discharged the day of surgery will not be allowed to drive home.    Special instructions:   [facility]- Preparing For Surgery  Before surgery, you can play an important role. Because skin is not sterile, your skin needs to be as free of germs as possible. You can reduce the number of germs on your skin by washing with CHG (chlorahexidine gluconate) Soap before surgery.  CHG is an antiseptic cleaner which kills germs and bonds with the skin to continue killing germs even after washing.  Please do not use if you have an allergy to CHG or antibacterial soaps. If your skin becomes reddened/irritated stop using the CHG.  Do not shave (including legs and underarms) for at least 48 hours prior to first CHG shower. It is OK to shave your face.  Please follow these instructions carefully.   1. Shower the NIGHT BEFORE SURGERY and the MORNING OF SURGERY with CHG.   2. If you chose to wash your hair, wash your hair first as usual with your normal shampoo.  3. After you shampoo, rinse your hair and body thoroughly to remove the shampoo.  4. Use CHG as you would any other liquid soap. You can apply CHG directly to the skin and wash gently with a scrungie or a clean washcloth.   5. Apply the CHG Soap to your body ONLY FROM THE NECK DOWN.  Do not use on open wounds or open sores. Avoid contact with your eyes, ears, mouth and genitals (private parts). Wash genitals (private parts) with your normal soap.  6. Wash thoroughly, paying special attention to the area where your surgery will be performed.  7. Thoroughly rinse your body with warm water from the neck down.  8. DO NOT shower/wash with your normal soap after using and rinsing off the CHG Soap.  9. Pat yourself dry with a CLEAN TOWEL.   10. Wear CLEAN PAJAMAS   11. Place CLEAN SHEETS on your bed the night of your first shower and  DO NOT SLEEP WITH PETS.    Day of Surgery: Do not apply any deodorants/lotions. Please wear clean clothes to the hospital/surgery center.      Please read over the following fact sheets that you were given.

## 2016-01-19 ENCOUNTER — Encounter (HOSPITAL_COMMUNITY): Payer: Self-pay

## 2016-01-19 ENCOUNTER — Other Ambulatory Visit: Payer: Self-pay | Admitting: Internal Medicine

## 2016-01-19 ENCOUNTER — Encounter (HOSPITAL_COMMUNITY)
Admission: RE | Admit: 2016-01-19 | Discharge: 2016-01-19 | Disposition: A | Discharge status: Home / Self Care | Payer: Medicare Other | Source: Ambulatory Visit | Attending: Plastic Surgery | Admitting: Plastic Surgery

## 2016-01-19 DIAGNOSIS — Z01818 Encounter for other preprocedural examination: Secondary | ICD-10-CM | POA: Insufficient documentation

## 2016-01-19 DIAGNOSIS — C439 Malignant melanoma of skin, unspecified: Secondary | ICD-10-CM

## 2016-01-19 HISTORY — DX: Presence of external hearing-aid: Z97.4

## 2016-01-19 HISTORY — DX: Chronic kidney disease, unspecified: N18.9

## 2016-01-19 HISTORY — DX: Presence of spectacles and contact lenses: Z97.3

## 2016-01-19 LAB — BASIC METABOLIC PANEL
Anion gap: 8 (ref 5–15)
BUN: 56 mg/dL — ABNORMAL HIGH (ref 6–20)
CALCIUM: 9.3 mg/dL (ref 8.9–10.3)
CO2: 25 mmol/L (ref 22–32)
CREATININE: 1.86 mg/dL — AB (ref 0.61–1.24)
Chloride: 102 mmol/L (ref 101–111)
GFR calc Af Amer: 36 mL/min — ABNORMAL LOW (ref >60)
GFR calc non Af Amer: 31 mL/min — ABNORMAL LOW (ref >60)
GLUCOSE: 294 mg/dL — AB (ref 65–99)
Potassium: 4.8 mmol/L (ref 3.5–5.1)
Sodium: 135 mmol/L (ref 135–145)

## 2016-01-19 LAB — GLUCOSE, CAPILLARY: GLUCOSE-CAPILLARY: 324 mg/dL — AB (ref 65–99)

## 2016-01-19 LAB — CBC
HCT: 35.9 % — ABNORMAL LOW (ref 39.0–52.0)
HEMOGLOBIN: 11.4 g/dL — AB (ref 13.0–17.0)
MCH: 27 pg (ref 26.0–34.0)
MCHC: 31.8 g/dL (ref 30.0–36.0)
MCV: 85.1 fL (ref 78.0–100.0)
PLATELETS: 196 10*3/uL (ref 150–400)
RBC: 4.22 MIL/uL (ref 4.22–5.81)
RDW: 14.9 % (ref 11.5–15.5)
WBC: 13 10*3/uL — ABNORMAL HIGH (ref 4.0–10.5)

## 2016-01-19 NOTE — Progress Notes (Signed)
Pt denies any acute cardiopulmonary issues. Pt is under the care of both Dr. Caryl Comes and Dr. [name], Cardiology. Pt stated that his fasting blood glucose usually ranges between 200-225. Pt stated that he was instructed by Dr. Caryl Comes to " take last dose of Eliquis on Saturday night, 24 hours prior to procedure." Pt chart forwarded to anesthesia for review of cardiac history, cardiac note and abnormal labs.

## 2016-01-19 NOTE — Pre-Procedure Instructions (Signed)
[name]  01/19/2016      [facility] [place] [place] [facility] [address] [place] [address] Phone: [phone] Fax: [phone]  [facility] [place], [place] - [address] [place] [place] [address] Phone: [phone] Fax: [phone]    Your procedure is scheduled on Monday, January 23, 2016  Report to [facility] Admitting at 5:30 A.M.  Call this number if you have problems the morning of surgery:  [phone]   Remember: Follow doctors instructions regarding apixaban Arne [place])  Do not eat food or drink liquids after midnight Sunday, January 22, 2016  Take these medicines the morning of surgery with A SIP OF WATER :carvedilol (COREG), DIGOX, escitalopram (LEXAPRO), hydrALAZINE (APRESOLINE), isosorbide mononitrate (IMDUR), silodosin (RAPAFLO), amiodarone (PACERONE), if needed: Tylenol for pain   Stop taking vitamins, fish oil and herbal medications. Do not take any NSAIDs ie: Ibuprofen, Advil, Naproxen, BC and Goody Powder; stop now.    How to Manage Your Diabetes Before and After Surgery  Why is it important to control my blood sugar before and after surgery? . Improving blood sugar levels before and after surgery helps healing and can limit problems. . A way of improving blood sugar control is eating a healthy diet by: o  Eating less sugar and carbohydrates o  Increasing activity/exercise o  Talking with your doctor about reaching your blood sugar goals . High blood sugars (greater than 180 mg/dL) can raise your risk of infections and slow your recovery, so you will need to focus on controlling your diabetes during the weeks before surgery. . Make sure that the doctor who takes care of your diabetes knows about your planned surgery including the date and location.  How do I manage my blood  sugar before surgery? . Check your blood sugar at least 4 times a day, starting 2 days before surgery, to make sure that the level is not too high or low. o Check your blood sugar the morning of your surgery when you wake up and every 2 hours until you get to the Short Stay unit. . If your blood sugar is less than 70 mg/dL, you will need to treat for low blood sugar: o Do not take insulin. o Treat a low blood sugar (less than 70 mg/dL) with  cup of clear juice (cranberry or apple), 4 glucose tablets, OR glucose gel. o Recheck blood sugar in 15 minutes after treatment (to make sure it is greater than 70 mg/dL). If your blood sugar is not greater than 70 mg/dL on recheck, call [phone] for further instructions. . Report your blood sugar to the short stay nurse when you get to Short Stay.  . If you are admitted to the hospital after surgery: o Your blood sugar will be checked by the staff and you will probably be given insulin after surgery (instead of oral diabetes medicines) to make sure you have good blood sugar levels. o The goal for blood sugar control after surgery is 80-180 mg/dL.  WHAT DO I DO ABOUT MY DIABETES MEDICATION?  [name] Do not take oral diabetes medicines (pills) the morning of surgery such as saxagliptin HCl (ONGLYZA) and glipiZIDE (GLUCOTROL)   . THE NIGHT BEFORE SURGERY, take  10 units of  Detemir (LEVEMIR) Insulin    . The day of surgery,  do not take other diabetes injectables, including Byetta (exenatide), Bydureon (exenatide ER), Victoza (liraglutide), or Trulicity (dulaglutide).  . If your CBG is greater than 220 mg/dL, you may take  of your sliding scale (correction) dose of insulin.  . Other Instructions: Do not take glipiZIDE (GLUCOTROL) the night before surgery.   Patient Signature:  Date:   Nurse Signature:  Date:   Reviewed and Endorsed by [facility] Patient Education Committee, August 2015  Do not wear jewelry, make-up or nail polish.  Do not wear  lotions, powders, or perfumes, or deoderant.  Do not shave 48 hours prior to surgery.  Men may shave face and neck.  Do not bring valuables to the hospital.  [facility] is not responsible for any belongings or valuables.  Contacts, dentures or bridgework may not be worn into surgery.  Leave your suitcase in the car.  After surgery it may be brought to your room.  For patients admitted to the hospital, discharge time will be determined by your treatment team.  Patients discharged the day of surgery will not be allowed to drive home.   Special instructions: Shower the night before surgery and the morning of surgery with CHG.  Please read over the following fact sheets that you were given. Pain Booklet, Coughing and Deep Breathing and Surgical Site Infection Prevention

## 2016-01-20 ENCOUNTER — Encounter (HOSPITAL_COMMUNITY): Payer: Self-pay
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
## 2016-01-22 MED ORDER — CEFAZOLIN SODIUM-DEXTROSE 2-4 GM/100ML-% IV SOLN
2.0000 g | INTRAVENOUS | Status: AC
  Administered 2016-01-23: 2 g via INTRAVENOUS
  Filled 2016-01-22: qty 100

## 2016-01-23 ENCOUNTER — Ambulatory Visit (HOSPITAL_COMMUNITY): Payer: Medicare Other | Admitting: Vascular Surgery

## 2016-01-23 ENCOUNTER — Ambulatory Visit (HOSPITAL_COMMUNITY)
Admission: RE | Admit: 2016-01-23 | Discharge: 2016-01-23 | Disposition: A | Discharge status: Home / Self Care | Payer: Medicare Other | Source: Ambulatory Visit | Attending: Plastic Surgery | Admitting: Plastic Surgery

## 2016-01-23 ENCOUNTER — Encounter (HOSPITAL_COMMUNITY)
Admission: RE | Disposition: A | Discharge status: Home / Self Care | Payer: Self-pay | Source: Ambulatory Visit | Attending: Plastic Surgery

## 2016-01-23 ENCOUNTER — Encounter (HOSPITAL_COMMUNITY): Payer: Self-pay | Admitting: Anesthesiology

## 2016-01-23 DIAGNOSIS — Z9989 Dependence on other enabling machines and devices: Secondary | ICD-10-CM | POA: Insufficient documentation

## 2016-01-23 DIAGNOSIS — Z95810 Presence of automatic (implantable) cardiac defibrillator: Secondary | ICD-10-CM | POA: Diagnosis not present

## 2016-01-23 DIAGNOSIS — N189 Chronic kidney disease, unspecified: Secondary | ICD-10-CM | POA: Diagnosis not present

## 2016-01-23 DIAGNOSIS — E785 Hyperlipidemia, unspecified: Secondary | ICD-10-CM | POA: Insufficient documentation

## 2016-01-23 DIAGNOSIS — E1151 Type 2 diabetes mellitus with diabetic peripheral angiopathy without gangrene: Secondary | ICD-10-CM | POA: Diagnosis not present

## 2016-01-23 DIAGNOSIS — C439 Malignant melanoma of skin, unspecified: Secondary | ICD-10-CM

## 2016-01-23 DIAGNOSIS — E1122 Type 2 diabetes mellitus with diabetic chronic kidney disease: Secondary | ICD-10-CM | POA: Insufficient documentation

## 2016-01-23 DIAGNOSIS — M159 Polyosteoarthritis, unspecified: Secondary | ICD-10-CM | POA: Diagnosis not present

## 2016-01-23 DIAGNOSIS — I509 Heart failure, unspecified: Secondary | ICD-10-CM | POA: Insufficient documentation

## 2016-01-23 DIAGNOSIS — D030 Melanoma in situ of lip: Secondary | ICD-10-CM | POA: Insufficient documentation

## 2016-01-23 DIAGNOSIS — Z951 Presence of aortocoronary bypass graft: Secondary | ICD-10-CM | POA: Insufficient documentation

## 2016-01-23 DIAGNOSIS — Z79899 Other long term (current) drug therapy: Secondary | ICD-10-CM | POA: Diagnosis not present

## 2016-01-23 DIAGNOSIS — Z87891 Personal history of nicotine dependence: Secondary | ICD-10-CM | POA: Diagnosis not present

## 2016-01-23 DIAGNOSIS — I130 Hypertensive heart and chronic kidney disease with heart failure and stage 1 through stage 4 chronic kidney disease, or unspecified chronic kidney disease: Secondary | ICD-10-CM | POA: Insufficient documentation

## 2016-01-23 DIAGNOSIS — I2510 Atherosclerotic heart disease of native coronary artery without angina pectoris: Secondary | ICD-10-CM | POA: Diagnosis not present

## 2016-01-23 DIAGNOSIS — I252 Old myocardial infarction: Secondary | ICD-10-CM | POA: Diagnosis not present

## 2016-01-23 DIAGNOSIS — I255 Ischemic cardiomyopathy: Secondary | ICD-10-CM | POA: Insufficient documentation

## 2016-01-23 DIAGNOSIS — Z7901 Long term (current) use of anticoagulants: Secondary | ICD-10-CM | POA: Diagnosis not present

## 2016-01-23 DIAGNOSIS — I4891 Unspecified atrial fibrillation: Secondary | ICD-10-CM | POA: Diagnosis not present

## 2016-01-23 DIAGNOSIS — F329 Major depressive disorder, single episode, unspecified: Secondary | ICD-10-CM | POA: Insufficient documentation

## 2016-01-23 DIAGNOSIS — C430 Malignant melanoma of lip: Secondary | ICD-10-CM | POA: Diagnosis not present

## 2016-01-23 DIAGNOSIS — Z794 Long term (current) use of insulin: Secondary | ICD-10-CM | POA: Diagnosis not present

## 2016-01-23 DIAGNOSIS — G4733 Obstructive sleep apnea (adult) (pediatric): Secondary | ICD-10-CM | POA: Diagnosis not present

## 2016-01-23 HISTORY — PX: LIPOMA EXCISION: SHX5283

## 2016-01-23 LAB — GLUCOSE, CAPILLARY
GLUCOSE-CAPILLARY: 172 mg/dL — AB (ref 65–99)
GLUCOSE-CAPILLARY: 191 mg/dL — AB (ref 65–99)

## 2016-01-23 LAB — PROTIME-INR
INR: 1.2
Prothrombin Time: 15.2 seconds (ref 11.4–15.2)

## 2016-01-23 SURGERY — EXCISION LIPOMA
Anesthesia: General | Site: Face | Laterality: Left

## 2016-01-23 MED ORDER — CHLORHEXIDINE GLUCONATE CLOTH 2 % EX PADS: 6.0000 | MEDICATED_PAD | Freq: Once | CUTANEOUS | Status: DC

## 2016-01-23 MED ORDER — MIDAZOLAM HCL 5 MG/5ML IJ SOLN
INTRAMUSCULAR | Status: DC | PRN
  Administered 2016-01-23: 0.5 mg via INTRAVENOUS

## 2016-01-23 MED ORDER — FENTANYL CITRATE (PF) 100 MCG/2ML IJ SOLN: 25.0000 ug | INTRAMUSCULAR | Status: DC | PRN

## 2016-01-23 MED ORDER — PROPOFOL 10 MG/ML IV BOLUS
INTRAVENOUS | Status: AC
  Filled 2016-01-23: qty 40

## 2016-01-23 MED ORDER — LIDOCAINE-EPINEPHRINE 1 %-1:100000 IJ SOLN
INTRAMUSCULAR | Status: AC
  Filled 2016-01-23: qty 1

## 2016-01-23 MED ORDER — LACTATED RINGERS IV SOLN
INTRAVENOUS | Status: DC | PRN
  Administered 2016-01-23: 07:00:00 via INTRAVENOUS

## 2016-01-23 MED ORDER — ROCURONIUM BROMIDE 100 MG/10ML IV SOLN
INTRAVENOUS | Status: DC | PRN
  Administered 2016-01-23: 30 mg via INTRAVENOUS

## 2016-01-23 MED ORDER — PROPOFOL 10 MG/ML IV BOLUS
INTRAVENOUS | Status: DC | PRN
  Administered 2016-01-23: 80 mg via INTRAVENOUS

## 2016-01-23 MED ORDER — FENTANYL CITRATE (PF) 100 MCG/2ML IJ SOLN
INTRAMUSCULAR | Status: AC
  Filled 2016-01-23: qty 2

## 2016-01-23 MED ORDER — LIDOCAINE-EPINEPHRINE (PF) 1 %-1:200000 IJ SOLN
INTRAMUSCULAR | Status: DC | PRN
  Administered 2016-01-23: 8 mL

## 2016-01-23 MED ORDER — LIDOCAINE HCL (CARDIAC) 20 MG/ML IV SOLN
INTRAVENOUS | Status: DC | PRN
  Administered 2016-01-23: 80 mg via INTRAVENOUS

## 2016-01-23 MED ORDER — WHITE PETROLATUM GEL
Status: DC | PRN
  Administered 2016-01-23: 1 via TOPICAL

## 2016-01-23 MED ORDER — BUPIVACAINE HCL (PF) 0.25 % IJ SOLN
INTRAMUSCULAR | Status: AC
  Filled 2016-01-23: qty 30

## 2016-01-23 MED ORDER — FENTANYL CITRATE (PF) 100 MCG/2ML IJ SOLN
INTRAMUSCULAR | Status: DC | PRN
  Administered 2016-01-23 (×2): 25 ug via INTRAVENOUS

## 2016-01-23 MED ORDER — 0.9 % SODIUM CHLORIDE (POUR BTL) OPTIME
TOPICAL | Status: DC | PRN
  Administered 2016-01-23: 1000 mL

## 2016-01-23 MED ORDER — MIDAZOLAM HCL 2 MG/2ML IJ SOLN
INTRAMUSCULAR | Status: AC
  Filled 2016-01-23: qty 2

## 2016-01-23 MED ORDER — SUGAMMADEX SODIUM 200 MG/2ML IV SOLN
INTRAVENOUS | Status: DC | PRN
  Administered 2016-01-23: 180 mg via INTRAVENOUS

## 2016-01-23 SURGICAL SUPPLY — 46 items
BLADE 10 SAFETY STRL DISP (BLADE) IMPLANT
BLADE SURG 15 STRL LF DISP TIS (BLADE) ×1 IMPLANT
BLADE SURG 15 STRL SS (BLADE) ×1
CANISTER SUCTION 2500CC (MISCELLANEOUS) ×2 IMPLANT
CONT SPEC 4OZ CLIKSEAL STRL BL (MISCELLANEOUS) ×4 IMPLANT
COVER SURGICAL LIGHT HANDLE (MISCELLANEOUS) ×2 IMPLANT
DRAPE ORTHO SPLIT 77X108 STRL (DRAPES) ×1
DRAPE SURG ORHT 6 SPLT 77X108 (DRAPES) ×1 IMPLANT
ELECT CAUTERY BLADE 6.4 (BLADE) ×2 IMPLANT
ELECT NEEDLE TIP 2.8 STRL (NEEDLE) IMPLANT
ELECT REM PT RETURN 9FT ADLT (ELECTROSURGICAL) ×2
ELECTRODE REM PT RTRN 9FT ADLT (ELECTROSURGICAL) ×1 IMPLANT
GAUZE SPONGE 4X4 12PLY STRL (GAUZE/BANDAGES/DRESSINGS) IMPLANT
GLOVE BIO SURGEON STRL SZ 6.5 (GLOVE) ×4 IMPLANT
GLOVE BIO SURGEON STRL SZ7 (GLOVE) ×2 IMPLANT
GLOVE BIO SURGEON STRL SZ8 (GLOVE) ×6 IMPLANT
GLOVE BIOGEL PI IND STRL 7.0 (GLOVE) ×1 IMPLANT
GLOVE BIOGEL PI IND STRL 7.5 (GLOVE) ×1 IMPLANT
GLOVE BIOGEL PI IND STRL 8.5 (GLOVE) ×2 IMPLANT
GLOVE BIOGEL PI INDICATOR 7.0 (GLOVE) ×1
GLOVE BIOGEL PI INDICATOR 7.5 (GLOVE) ×1
GLOVE BIOGEL PI INDICATOR 8.5 (GLOVE) ×2
GOWN STRL REUS W/ TWL LRG LVL3 (GOWN DISPOSABLE) ×2 IMPLANT
GOWN STRL REUS W/ TWL XL LVL3 (GOWN DISPOSABLE) ×2 IMPLANT
GOWN STRL REUS W/TWL LRG LVL3 (GOWN DISPOSABLE) ×2
GOWN STRL REUS W/TWL XL LVL3 (GOWN DISPOSABLE) ×2
KIT BASIN OR (CUSTOM PROCEDURE TRAY) ×2 IMPLANT
KIT ROOM TURNOVER OR (KITS) ×2 IMPLANT
NEEDLE 18GX1X1/2 (RX/OR ONLY) (NEEDLE) ×2 IMPLANT
NEEDLE HYPO 25GX1X1/2 BEV (NEEDLE) IMPLANT
NEEDLE HYPO 30X.5 LL (NEEDLE) ×2 IMPLANT
NS IRRIG 1000ML POUR BTL (IV SOLUTION) ×2 IMPLANT
PACK SURGICAL SETUP 50X90 (CUSTOM PROCEDURE TRAY) ×2 IMPLANT
PAD ARMBOARD 7.5X6 YLW CONV (MISCELLANEOUS) ×2 IMPLANT
PENCIL BUTTON HOLSTER BLD 10FT (ELECTRODE) ×2 IMPLANT
SPECIMEN JAR SMALL (MISCELLANEOUS) IMPLANT
SPONGE LAP 18X18 X RAY DECT (DISPOSABLE) IMPLANT
SUT MON AB 5-0 PS2 18 (SUTURE) ×2 IMPLANT
SUT SILK 4 0 P 3 (SUTURE) ×2 IMPLANT
SUT VIC AB 5-0 P-3 18XBRD (SUTURE) ×1 IMPLANT
SUT VIC AB 5-0 P3 18 (SUTURE) ×1
SYR BULB 3OZ (MISCELLANEOUS) ×2 IMPLANT
SYR CONTROL 10ML LL (SYRINGE) ×4 IMPLANT
TOWEL OR 17X24 6PK STRL BLUE (TOWEL DISPOSABLE) IMPLANT
TOWEL OR 17X26 10 PK STRL BLUE (TOWEL DISPOSABLE) ×2 IMPLANT
YANKAUER SUCT BULB TIP NO VENT (SUCTIONS) ×2 IMPLANT

## 2016-01-23 NOTE — Anesthesia Procedure Notes (Signed)
Procedure Name: Intubation Date/Time: 01/23/2016 7:56 AM Performed by: Terrill Mohr Pre-anesthesia Checklist: Patient identified, Emergency Drugs available, Suction available and Patient being monitored Patient Re-evaluated:Patient Re-evaluated prior to inductionOxygen Delivery Method: Circle system utilized Preoxygenation: Pre-oxygenation with 100% oxygen Intubation Type: IV induction Ventilation: Mask ventilation without difficulty Laryngoscope Size: Mac and 4 Grade View: Grade II Tube type: Oral Tube size: 8.0 mm Number of attempts: 1 Airway Equipment and Method: Stylet Placement Confirmation: ETT inserted through vocal cords under direct vision,  positive ETCO2 and breath sounds checked- equal and bilateral Secured at: 23 (cm at teeth) cm Tube secured with: Tape Dental Injury: Teeth and Oropharynx as per pre-operative assessment

## 2016-01-23 NOTE — Anesthesia Preprocedure Evaluation (Addendum)
Anesthesia Evaluation  Patient identified by MRN, date of birth, ID band Patient awake    Reviewed: Allergy & Precautions, NPO status , Patient's Chart, lab work & pertinent test results  History of Anesthesia Complications Negative for: history of anesthetic complications  Airway Mallampati: II  TM Distance: >3 FB Neck ROM: Full    Dental  (+) Teeth Intact, Poor Dentition, Missing   Pulmonary shortness of breath, former smoker,    breath sounds clear to auscultation       Cardiovascular hypertension, + CAD, + Past MI, + Peripheral Vascular Disease and +CHF  + Cardiac Defibrillator  Rhythm:Regular Rate:Normal + Systolic murmurs    Neuro/Psych    GI/Hepatic   Endo/Other  diabetes  Renal/GU Renal InsufficiencyRenal disease     Musculoskeletal  (+) Arthritis ,   Abdominal   Peds  Hematology   Anesthesia Other Findings   Reproductive/Obstetrics                            Anesthesia Physical Anesthesia Plan  ASA: IV  Anesthesia Plan: General   Post-op Pain Management:    Induction: Intravenous  Airway Management Planned: Oral ETT  Additional Equipment:   Intra-op Plan:   Post-operative Plan: Extubation in OR  Informed Consent: I have reviewed the patients History and Physical, chart, labs and discussed the procedure including the risks, benefits and alternatives for the proposed anesthesia with the patient or authorized representative who has indicated his/her understanding and acceptance.     Plan Discussed with:   Anesthesia Plan Comments:        Anesthesia Quick Evaluation

## 2016-01-23 NOTE — Op Note (Addendum)
DATE OF OPERATION: 01/23/2016  LOCATION: [facility]  PREOPERATIVE DIAGNOSIS: Lower lip Melanoma  POSTOPERATIVE DIAGNOSIS: Same  PROCEDURE: Excision of lower lip melanoma 2 x 4 cm  SURGEON: Mendy Lapinsky Sanger Hershal Eriksson, DO  ASSISTANT: Berline Lopes, MD  EBL: minimal  CONDITION: Stable  COMPLICATIONS: None  INDICATION: The patient, [name], is a 82 y.o. male born on 01/24/1929, is here for treatment of a changing skin lesion on the lower lip that was read as a possible melanoma vs dysplasia. The lesion abutted the vermillion border.  PROCEDURE DETAILS:  The patient was seen on the morning of surgery.  The IV antibiotics were given. The patient was taken to the operating room and given a general anesthetic. A standard time out was performed and all information was confirmed by those in the room. SCDs were placed.   The area was marked and injected with local for intraoperative hemostasis.  The 12 o'clock position was marked with a double silk stitch.  The 9 o'clock position was marked with a long stitch.  The #15 blade was used to excise the 2 x 4 cm lesion.  The tissue at the 9 o' clock stitch broke and was sent separately.  A new long stitch was placed at the new 9 o'clock position of the main specimen.  The lower left portion of the lesion was at the vermillion border.  This was excised into the chin skin as a V shape.  The specimen was sent to pathology.  A 5-0 Monocryl was used to reapproximate the vermillion border.  The 4-0 Vicryl was used for the red portion of the lip and this was closed in the cephalad / caudal direction.  The patient was allowed to wake up and taken to recovery room in stable condition at the end of the case. The family was notified at the end of the case.

## 2016-01-23 NOTE — Addendum Note (Signed)
Addendum  created 01/23/16 1506 by Terrill Mohr, CRNA   Anesthesia Event edited

## 2016-01-23 NOTE — Anesthesia Postprocedure Evaluation (Signed)
Anesthesia Post Note  Patient: [name]  Procedure(s) Performed: Procedure(s) (LRB): EXCISION OF LEFT LOWER LIP MELANOMA WITH TISSUE ADVANCEMENT (Left)  Patient location during evaluation: PACU Anesthesia Type: General Level of consciousness: awake and alert Pain management: pain level controlled Vital Signs Assessment: post-procedure vital signs reviewed and stable Respiratory status: spontaneous breathing, nonlabored ventilation, respiratory function stable and patient connected to nasal cannula oxygen Cardiovascular status: blood pressure returned to baseline and stable Postop Assessment: no signs of nausea or vomiting Anesthetic complications: no    Last Vitals:  Vitals:   01/23/16 1000 01/23/16 1009  BP:  119/63  Pulse:  60  Resp:  14  Temp: 36.3 C 36.4 C    Last Pain:  Vitals:   01/23/16 1000  TempSrc:   PainSc: 0-No pain                 Anaise Sterbenz,JAMES TERRILL

## 2016-01-23 NOTE — Progress Notes (Signed)
Bryan from [place] called to confirm interrogation . No problem noted

## 2016-01-23 NOTE — Transfer of Care (Signed)
Immediate Anesthesia Transfer of Care Note  Patient: [name]  Procedure(s) Performed: Procedure(s): EXCISION OF LEFT LOWER LIP MELANOMA WITH TISSUE ADVANCEMENT (Left)  Patient Location: PACU  Anesthesia Type:General  Level of Consciousness: awake, alert , oriented and patient cooperative  Airway & Oxygen Therapy: Patient Spontanous Breathing and Patient connected to nasal cannula oxygen  Post-op Assessment: Report given to RN, Post -op Vital signs reviewed and stable and Patient moving all extremities  Post vital signs: Reviewed and stable  Last Vitals:  Vitals:   01/23/16 0559 01/23/16 0852  BP: (!) 127/49 124/60  Pulse: 60 60  Resp: 20 13  Temp: 36.6 C 36.4 C    Last Pain:  Vitals:   01/23/16 0852  TempSrc:   PainSc: 0-No pain         Complications: No apparent anesthesia complications

## 2016-01-23 NOTE — Telephone Encounter (Signed)
Sent to the pharmacy by e-scribe.  Has upcoming follow up on 02/05/16.  Due for wellness 06/2016.

## 2016-01-23 NOTE — Brief Op Note (Signed)
01/23/2016  8:46 AM  PATIENT:  [name]  82 y.o. male  PRE-OPERATIVE DIAGNOSIS:  LEFT LOWER LIP MELANOMA  POST-OPERATIVE DIAGNOSIS:  LEFT LOWER LIP MELANOMA  PROCEDURE:  Procedure(s): EXCISION OF LEFT LOWER LIP MELANOMA WITH TISSUE ADVANCEMENT (Left)  SURGEON:  Surgeon(s) and Role:    * Philippe Gang S Braelynn Benning, DO - Primary  ASSISTANTS: none   ANESTHESIA:   general  EBL:  Total I/O In: 600 [I.V.:600] Out: -   BLOOD ADMINISTERED:none  DRAINS: none   LOCAL MEDICATIONS USED:  LIDOCAINE   SPECIMEN:  Source of Specimen:  lower lip lesion  DISPOSITION OF SPECIMEN:  PATHOLOGY  COUNTS:  YES  TOURNIQUET:  * No tourniquets in log *  DICTATION: .Dragon Dictation  PLAN OF CARE: Discharge to home after PACU  PATIENT DISPOSITION:  PACU - hemodynamically stable.   Delay start of Pharmacological VTE agent (>24hrs) due to surgical blood loss or risk of bleeding: no

## 2016-01-23 NOTE — Progress Notes (Signed)
St. Jude interrogation completed on AICD. Pending communication from company

## 2016-01-23 NOTE — Discharge Instructions (Signed)
vaseline to the lower lip twice a day Nothing hot to drink for 24 hours
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
## 2016-01-24 ENCOUNTER — Encounter (HOSPITAL_COMMUNITY): Payer: Self-pay | Admitting: Plastic Surgery

## 2016-01-24 ENCOUNTER — Ambulatory Visit: Payer: Medicare Other | Admitting: Internal Medicine

## 2016-01-25 DIAGNOSIS — D030 Melanoma in situ of lip: Secondary | ICD-10-CM | POA: Diagnosis not present
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
## 2016-02-03 ENCOUNTER — Ambulatory Visit (INDEPENDENT_AMBULATORY_CARE_PROVIDER_SITE_OTHER): Payer: Medicare Other | Admitting: Internal Medicine

## 2016-02-03 ENCOUNTER — Encounter: Payer: Self-pay | Admitting: Internal Medicine

## 2016-02-03 DIAGNOSIS — Z794 Long term (current) use of insulin: Secondary | ICD-10-CM | POA: Diagnosis not present

## 2016-02-03 DIAGNOSIS — E1140 Type 2 diabetes mellitus with diabetic neuropathy, unspecified: Secondary | ICD-10-CM

## 2016-02-03 DIAGNOSIS — Z79899 Other long term (current) drug therapy: Secondary | ICD-10-CM

## 2016-02-03 DIAGNOSIS — R5383 Other fatigue: Secondary | ICD-10-CM

## 2016-02-03 DIAGNOSIS — I255 Ischemic cardiomyopathy: Secondary | ICD-10-CM

## 2016-02-03 NOTE — Patient Instructions (Addendum)
Continue to avoid sugars sweets and also simple carbohydrates such as breads and biscuits and bagels. Increase your insulin to 26 units a day. We may need to increase again  Please send in  bg readings after a month .   Continue to record  blood sugars. If they are below 110 contact us. If you BG are 300 and over contact us for advice  You're low energy could be from a number of things including high blood sugars and having to eat differently.  Since depression seems to be controlled you have option to  decrease the Lexapro to 5 mg every other day alternating with 10 mg every other day  After 2 weeks  Take just 5 mg every day   This is to see if your fatigue is any better.  ROV in 2 months or as needed

## 2016-02-08 NOTE — Progress Notes (Signed)
lov dr Regis Bill 02/03/16, lov s rayburn pa- note 01/31/16 regarding eloquis, hgba1c 10/17 epic, last interrogation 7/17 epic, eccho 4/17 with ekg, lov dr  [name] 9/17, lov dr sood 7/17 all in epic

## 2016-02-08 NOTE — Progress Notes (Signed)
Wife states is now taking 24u levimir at hs

## 2016-02-08 NOTE — Progress Notes (Signed)
Dr Merilyn Baba PLACE SURGICAL ORDERS IN EPIC--- [facility]

## 2016-02-09 ENCOUNTER — Ambulatory Visit: Payer: Self-pay | Admitting: Plastic Surgery

## 2016-02-09 DIAGNOSIS — C439 Malignant melanoma of skin, unspecified: Secondary | ICD-10-CM

## 2016-02-09 LAB — CUP PACEART REMOTE DEVICE CHECK
Brady Statistic AP VP Percent: 99 %
Brady Statistic AP VS Percent: 1 %
Brady Statistic AS VP Percent: 1 %
Brady Statistic AS VS Percent: 1 %
Brady Statistic RA Percent Paced: 99 %
Date Time Interrogation Session: 20171024080020
HighPow Impedance: 45 Ohm
HighPow Impedance: 46 Ohm
Implantable Lead Implant Date: 20051221
Implantable Lead Implant Date: 20150304
Implantable Lead Location: 753858
Lead Channel Impedance Value: 380 Ohm
Lead Channel Pacing Threshold Pulse Width: 0.5 ms
Lead Channel Pacing Threshold Pulse Width: 1 ms
Lead Channel Sensing Intrinsic Amplitude: 1.9 mV
Lead Channel Setting Pacing Amplitude: 2.5 V
Lead Channel Setting Pacing Pulse Width: 0.5 ms
MDC IDC LEAD IMPLANT DT: 20150304
MDC IDC LEAD LOCATION: 753859
MDC IDC LEAD LOCATION: 753860
MDC IDC MSMT BATTERY REMAINING LONGEVITY: 25 mo
MDC IDC MSMT BATTERY REMAINING PERCENTAGE: 47 %
MDC IDC MSMT BATTERY VOLTAGE: 2.89 V
MDC IDC MSMT LEADCHNL LV IMPEDANCE VALUE: 430 Ohm
MDC IDC MSMT LEADCHNL LV PACING THRESHOLD AMPLITUDE: 1.5 V
MDC IDC MSMT LEADCHNL RA PACING THRESHOLD AMPLITUDE: 0.75 V
MDC IDC MSMT LEADCHNL RA PACING THRESHOLD PULSEWIDTH: 0.5 ms
MDC IDC MSMT LEADCHNL RV IMPEDANCE VALUE: 360 Ohm
MDC IDC MSMT LEADCHNL RV PACING THRESHOLD AMPLITUDE: 1.25 V
MDC IDC MSMT LEADCHNL RV SENSING INTR AMPL: 12 mV
MDC IDC PG IMPLANT DT: 20150304
MDC IDC SET LEADCHNL LV PACING AMPLITUDE: 2.5 V
MDC IDC SET LEADCHNL LV PACING PULSEWIDTH: 1 ms
MDC IDC SET LEADCHNL RA PACING AMPLITUDE: 2 V
MDC IDC SET LEADCHNL RV SENSING SENSITIVITY: 0.5 mV
Pulse Gen Serial Number: 7170478

## 2016-02-13 ENCOUNTER — Encounter (HOSPITAL_COMMUNITY)
Admission: RE | Disposition: A | Discharge status: Home / Self Care | Payer: Self-pay | Source: Ambulatory Visit | Attending: Plastic Surgery

## 2016-02-13 ENCOUNTER — Encounter (HOSPITAL_COMMUNITY): Payer: Self-pay | Admitting: *Deleted

## 2016-02-13 ENCOUNTER — Ambulatory Visit (HOSPITAL_COMMUNITY): Payer: Medicare Other | Admitting: Anesthesiology

## 2016-02-13 ENCOUNTER — Ambulatory Visit (HOSPITAL_BASED_OUTPATIENT_CLINIC_OR_DEPARTMENT_OTHER)
Admission: RE | Admit: 2016-02-13 | Discharge: 2016-02-13 | Disposition: A | Discharge status: Home / Self Care | Payer: Medicare Other | Source: Ambulatory Visit | Attending: Plastic Surgery | Admitting: Plastic Surgery

## 2016-02-13 DIAGNOSIS — Z7901 Long term (current) use of anticoagulants: Secondary | ICD-10-CM | POA: Diagnosis not present

## 2016-02-13 DIAGNOSIS — I080 Rheumatic disorders of both mitral and aortic valves: Secondary | ICD-10-CM | POA: Insufficient documentation

## 2016-02-13 DIAGNOSIS — Z79899 Other long term (current) drug therapy: Secondary | ICD-10-CM | POA: Diagnosis not present

## 2016-02-13 DIAGNOSIS — Z7984 Long term (current) use of oral hypoglycemic drugs: Secondary | ICD-10-CM | POA: Insufficient documentation

## 2016-02-13 DIAGNOSIS — Z95810 Presence of automatic (implantable) cardiac defibrillator: Secondary | ICD-10-CM | POA: Insufficient documentation

## 2016-02-13 DIAGNOSIS — F329 Major depressive disorder, single episode, unspecified: Secondary | ICD-10-CM | POA: Insufficient documentation

## 2016-02-13 DIAGNOSIS — I2510 Atherosclerotic heart disease of native coronary artery without angina pectoris: Secondary | ICD-10-CM | POA: Insufficient documentation

## 2016-02-13 DIAGNOSIS — G4733 Obstructive sleep apnea (adult) (pediatric): Secondary | ICD-10-CM | POA: Diagnosis not present

## 2016-02-13 DIAGNOSIS — I509 Heart failure, unspecified: Secondary | ICD-10-CM | POA: Diagnosis not present

## 2016-02-13 DIAGNOSIS — I130 Hypertensive heart and chronic kidney disease with heart failure and stage 1 through stage 4 chronic kidney disease, or unspecified chronic kidney disease: Secondary | ICD-10-CM | POA: Diagnosis not present

## 2016-02-13 DIAGNOSIS — C439 Malignant melanoma of skin, unspecified: Secondary | ICD-10-CM

## 2016-02-13 DIAGNOSIS — E1151 Type 2 diabetes mellitus with diabetic peripheral angiopathy without gangrene: Secondary | ICD-10-CM | POA: Insufficient documentation

## 2016-02-13 DIAGNOSIS — N189 Chronic kidney disease, unspecified: Secondary | ICD-10-CM | POA: Diagnosis not present

## 2016-02-13 DIAGNOSIS — E785 Hyperlipidemia, unspecified: Secondary | ICD-10-CM | POA: Insufficient documentation

## 2016-02-13 DIAGNOSIS — I252 Old myocardial infarction: Secondary | ICD-10-CM | POA: Insufficient documentation

## 2016-02-13 DIAGNOSIS — I4891 Unspecified atrial fibrillation: Secondary | ICD-10-CM | POA: Diagnosis not present

## 2016-02-13 DIAGNOSIS — Z794 Long term (current) use of insulin: Secondary | ICD-10-CM | POA: Insufficient documentation

## 2016-02-13 DIAGNOSIS — Z9989 Dependence on other enabling machines and devices: Secondary | ICD-10-CM | POA: Diagnosis not present

## 2016-02-13 DIAGNOSIS — I255 Ischemic cardiomyopathy: Secondary | ICD-10-CM | POA: Diagnosis not present

## 2016-02-13 DIAGNOSIS — G2581 Restless legs syndrome: Secondary | ICD-10-CM | POA: Insufficient documentation

## 2016-02-13 DIAGNOSIS — N183 Chronic kidney disease, stage 3 (moderate): Secondary | ICD-10-CM | POA: Diagnosis not present

## 2016-02-13 DIAGNOSIS — C430 Malignant melanoma of lip: Secondary | ICD-10-CM | POA: Diagnosis not present

## 2016-02-13 DIAGNOSIS — D030 Melanoma in situ of lip: Secondary | ICD-10-CM | POA: Diagnosis not present

## 2016-02-13 DIAGNOSIS — L988 Other specified disorders of the skin and subcutaneous tissue: Secondary | ICD-10-CM | POA: Diagnosis not present

## 2016-02-13 DIAGNOSIS — E1122 Type 2 diabetes mellitus with diabetic chronic kidney disease: Secondary | ICD-10-CM | POA: Diagnosis not present

## 2016-02-13 DIAGNOSIS — Z87891 Personal history of nicotine dependence: Secondary | ICD-10-CM | POA: Insufficient documentation

## 2016-02-13 DIAGNOSIS — Z951 Presence of aortocoronary bypass graft: Secondary | ICD-10-CM | POA: Diagnosis not present

## 2016-02-13 DIAGNOSIS — I129 Hypertensive chronic kidney disease with stage 1 through stage 4 chronic kidney disease, or unspecified chronic kidney disease: Secondary | ICD-10-CM | POA: Diagnosis not present

## 2016-02-13 HISTORY — PX: MASS EXCISION: SHX2000

## 2016-02-13 LAB — CBC
HCT: 34.9 % — ABNORMAL LOW (ref 39.0–52.0)
HEMOGLOBIN: 11 g/dL — AB (ref 13.0–17.0)
MCH: 26.6 pg (ref 26.0–34.0)
MCHC: 31.5 g/dL (ref 30.0–36.0)
MCV: 84.5 fL (ref 78.0–100.0)
PLATELETS: 178 10*3/uL (ref 150–400)
RBC: 4.13 MIL/uL — AB (ref 4.22–5.81)
RDW: 14.9 % (ref 11.5–15.5)
WBC: 10.2 10*3/uL (ref 4.0–10.5)

## 2016-02-13 LAB — BASIC METABOLIC PANEL
ANION GAP: 11 (ref 5–15)
BUN: 60 mg/dL — ABNORMAL HIGH (ref 6–20)
CALCIUM: 9.3 mg/dL (ref 8.9–10.3)
CHLORIDE: 104 mmol/L (ref 101–111)
CO2: 24 mmol/L (ref 22–32)
CREATININE: 2.13 mg/dL — AB (ref 0.61–1.24)
GFR calc non Af Amer: 27 mL/min — ABNORMAL LOW (ref >60)
GFR, EST AFRICAN AMERICAN: 31 mL/min — AB (ref >60)
Glucose, Bld: 333 mg/dL — ABNORMAL HIGH (ref 65–99)
Potassium: 4.4 mmol/L (ref 3.5–5.1)
SODIUM: 139 mmol/L (ref 135–145)

## 2016-02-13 LAB — GLUCOSE, CAPILLARY
GLUCOSE-CAPILLARY: 297 mg/dL — AB (ref 65–99)
Glucose-Capillary: 309 mg/dL — ABNORMAL HIGH (ref 65–99)
Glucose-Capillary: 319 mg/dL — ABNORMAL HIGH (ref 65–99)

## 2016-02-13 LAB — PROTIME-INR
INR: 1.05
PROTHROMBIN TIME: 13.8 s (ref 11.4–15.2)

## 2016-02-13 SURGERY — EXCISION MASS
Anesthesia: General

## 2016-02-13 MED ORDER — LACTATED RINGERS IV SOLN
INTRAVENOUS | Status: DC | PRN
  Administered 2016-02-13: 07:00:00 via INTRAVENOUS

## 2016-02-13 MED ORDER — ETOMIDATE 2 MG/ML IV SOLN
INTRAVENOUS | Status: DC | PRN
  Administered 2016-02-13: 60 mg via INTRAVENOUS
  Administered 2016-02-13: 140 mg via INTRAVENOUS

## 2016-02-13 MED ORDER — BACITRACIN ZINC 500 UNIT/GM EX OINT
TOPICAL_OINTMENT | CUTANEOUS | Status: DC | PRN
  Administered 2016-02-13: 1 via TOPICAL

## 2016-02-13 MED ORDER — PROPOFOL 10 MG/ML IV BOLUS
INTRAVENOUS | Status: AC
  Filled 2016-02-13: qty 20

## 2016-02-13 MED ORDER — LIDOCAINE-EPINEPHRINE (PF) 1 %-1:200000 IJ SOLN
INTRAMUSCULAR | Status: AC
  Filled 2016-02-13: qty 30

## 2016-02-13 MED ORDER — CEFAZOLIN SODIUM-DEXTROSE 2-4 GM/100ML-% IV SOLN
INTRAVENOUS | Status: AC
  Filled 2016-02-13: qty 100

## 2016-02-13 MED ORDER — FENTANYL CITRATE (PF) 100 MCG/2ML IJ SOLN
INTRAMUSCULAR | Status: AC
  Filled 2016-02-13: qty 2

## 2016-02-13 MED ORDER — BACITRACIN ZINC 500 UNIT/GM EX OINT
TOPICAL_OINTMENT | CUTANEOUS | Status: AC
  Filled 2016-02-13: qty 28.35

## 2016-02-13 MED ORDER — LIDOCAINE-EPINEPHRINE (PF) 1 %-1:200000 IJ SOLN
INTRAMUSCULAR | Status: DC | PRN
  Administered 2016-02-13: 2 mL

## 2016-02-13 MED ORDER — CEFAZOLIN SODIUM-DEXTROSE 2-4 GM/100ML-% IV SOLN
2.0000 g | INTRAVENOUS | Status: AC
  Administered 2016-02-13: 2 g via INTRAVENOUS
  Filled 2016-02-13: qty 100

## 2016-02-13 MED ORDER — ONDANSETRON HCL 4 MG/2ML IJ SOLN
INTRAMUSCULAR | Status: DC | PRN
  Administered 2016-02-13: 4 mg via INTRAVENOUS

## 2016-02-13 MED ORDER — BUPIVACAINE HCL (PF) 0.25 % IJ SOLN
INTRAMUSCULAR | Status: AC
  Filled 2016-02-13: qty 30

## 2016-02-13 MED ORDER — SODIUM CHLORIDE 0.9 % IR SOLN
Status: AC
  Filled 2016-02-13: qty 500000

## 2016-02-13 MED ORDER — 0.9 % SODIUM CHLORIDE (POUR BTL) OPTIME
TOPICAL | Status: DC | PRN
  Administered 2016-02-13: 1000 mL

## 2016-02-13 MED ORDER — EPHEDRINE SULFATE-NACL 50-0.9 MG/10ML-% IV SOSY
PREFILLED_SYRINGE | INTRAVENOUS | Status: DC | PRN
  Administered 2016-02-13: 10 mg via INTRAVENOUS
  Administered 2016-02-13: 5 mg via INTRAVENOUS

## 2016-02-13 MED ORDER — FENTANYL CITRATE (PF) 100 MCG/2ML IJ SOLN
INTRAMUSCULAR | Status: DC | PRN
  Administered 2016-02-13: 50 ug via INTRAVENOUS

## 2016-02-13 SURGICAL SUPPLY — 43 items
BANDAGE ACE 4X5 VEL STRL LF (GAUZE/BANDAGES/DRESSINGS) IMPLANT
BNDG GAUZE ELAST 4 BULKY (GAUZE/BANDAGES/DRESSINGS) IMPLANT
CONT SPECI 4OZ STER CLIK (MISCELLANEOUS) IMPLANT
CORDS BIPOLAR (ELECTRODE) ×2 IMPLANT
COVER SURGICAL LIGHT HANDLE (MISCELLANEOUS) IMPLANT
DECANTER SPIKE VIAL GLASS SM (MISCELLANEOUS) ×2 IMPLANT
DRAPE INCISE IOBAN 66X45 STRL (DRAPES) IMPLANT
DRAPE LAPAROTOMY T 102X78X121 (DRAPES) IMPLANT
DRAPE UTILITY XL STRL (DRAPES) IMPLANT
DRESSING DUODERM 4X4 STERILE (GAUZE/BANDAGES/DRESSINGS) IMPLANT
DRSG ADAPTIC 3X8 NADH LF (GAUZE/BANDAGES/DRESSINGS) IMPLANT
DRSG PAD ABDOMINAL 8X10 ST (GAUZE/BANDAGES/DRESSINGS) IMPLANT
ELECT PENCIL ROCKER SW 15FT (MISCELLANEOUS) ×2 IMPLANT
ELECT REM PT RETURN 9FT ADLT (ELECTROSURGICAL)
ELECTRODE REM PT RTRN 9FT ADLT (ELECTROSURGICAL) IMPLANT
GAUZE SPONGE 4X4 12PLY STRL (GAUZE/BANDAGES/DRESSINGS) IMPLANT
GLOVE BIO SURGEON STRL SZ 6.5 (GLOVE) ×2 IMPLANT
GOWN STRL REUS W/TWL LRG LVL3 (GOWN DISPOSABLE) ×4 IMPLANT
HANDPIECE INTERPULSE COAX TIP (DISPOSABLE)
KIT BASIN OR (CUSTOM PROCEDURE TRAY) ×2 IMPLANT
NEEDLE HYPO 22GX1.5 SAFETY (NEEDLE) ×2 IMPLANT
PACK EENT SPLIT (PACKS) ×2 IMPLANT
PACK GENERAL/GYN (CUSTOM PROCEDURE TRAY) IMPLANT
PAD CAST 4YDX4 CTTN HI CHSV (CAST SUPPLIES) IMPLANT
PADDING CAST COTTON 4X4 STRL (CAST SUPPLIES)
SET HNDPC FAN SPRY TIP SCT (DISPOSABLE) IMPLANT
SOL PREP POV-IOD 16OZ 10% (MISCELLANEOUS) ×2 IMPLANT
STAPLER VISISTAT 35W (STAPLE) IMPLANT
SUCTION FRAZIER HANDLE 10FR (MISCELLANEOUS) ×1
SUCTION TUBE FRAZIER 10FR DISP (MISCELLANEOUS) ×1 IMPLANT
SUT MNCRL AB 4-0 PS2 18 (SUTURE) IMPLANT
SUT SILK 4 0 PS 2 (SUTURE) ×2 IMPLANT
SUT VIC AB 4-0 RB1 27 (SUTURE) ×1
SUT VIC AB 4-0 RB1 27XBRD (SUTURE) ×1 IMPLANT
SUT VIC AB 5-0 PS2 18 (SUTURE) IMPLANT
SUT VIC AB 5-0 RB1 27 (SUTURE) ×2 IMPLANT
SWAB COLLECTION DEVICE MRSA (MISCELLANEOUS) IMPLANT
SWAB CULTURE ESWAB REG 1ML (MISCELLANEOUS) IMPLANT
SYR BULB IRRIGATION 50ML (SYRINGE) ×2 IMPLANT
SYR CONTROL 10ML LL (SYRINGE) IMPLANT
TOWEL OR 17X26 10 PK STRL BLUE (TOWEL DISPOSABLE) ×2 IMPLANT
TOWEL OR NON WOVEN STRL DISP B (DISPOSABLE) ×2 IMPLANT
YANKAUER SUCT BULB TIP 10FT TU (MISCELLANEOUS) ×2 IMPLANT

## 2016-02-13 NOTE — Transfer of Care (Signed)
Immediate Anesthesia Transfer of Care Note  Patient: [name]  Procedure(s) Performed: Procedure(s): RE-EXCISION OF MELANOMA IN SITU (N/A)  Patient Location: PACU  Anesthesia Type:General  Level of Consciousness: awake, alert  and oriented  Airway & Oxygen Therapy: Patient Spontanous Breathing and Patient connected to face mask oxygen  Post-op Assessment: Report given to RN and Post -op Vital signs reviewed and stable  Post vital signs: Reviewed and stable  Last Vitals:  Vitals:   02/13/16 0533  BP: (!) 139/56  Pulse: (!) 59  Resp: 18  Temp: 36.8 C    Last Pain:  Vitals:   02/13/16 0533  TempSrc: Oral      Patients Stated Pain Goal: 4 (13/08/65 7846)  Complications: No apparent anesthesia complications

## 2016-02-13 NOTE — Discharge Instructions (Signed)
Incision Care, Adult An incision is a cut that a doctor makes in your skin for surgery (for a procedure). Most times, these cuts are closed after surgery. Your cut from surgery may be closed with stitches (sutures), staples, skin glue, or skin tape (adhesive strips). You may need to return to your doctor to have stitches or staples taken out. This may happen many days or many weeks after your surgery. The cut needs to be well cared for so it does not get infected. How to care for your cut Cut care  Follow instructions from your doctor about how to take care of your cut. Make sure you:  Wash your hands with soap and water before you change your bandage (dressing). If you cannot use soap and water, use hand sanitizer.  Change your bandage as told by your doctor.  Leave stitches, skin glue, or skin tape in place. They may need to stay in place for 2 weeks or longer. If tape strips get loose and curl up, you may trim the loose edges. Do not remove tape strips completely unless your doctor says it is okay.  Check your cut area every day for signs of infection. Check for:  More redness, swelling, or pain.  More fluid or blood.  Warmth.  Pus or a bad smell.  Ask your doctor how to clean the cut. This may include:  Using mild soap and water.  Using a clean towel to pat the cut dry after you clean it.  Putting a cream or ointment on the cut. Do this only as told by your doctor.  Covering the cut with a clean bandage.  Ask your doctor when you can leave the cut uncovered.  Do not take baths, swim, or use a hot tub until your doctor says it is okay. Ask your doctor if you can take showers. You may only be allowed to take sponge baths for bathing. Medicines  If you were prescribed an antibiotic medicine, cream, or ointment, take the antibiotic or put it on the cut as told by your doctor. Do not stop taking or putting on the antibiotic even if your condition gets better.  Take  over-the-counter and prescription medicines only as told by your doctor. General instructions  Limit movement around your cut. This helps healing.  Avoid straining, lifting, or exercise for the first month, or for as long as told by your doctor.  Follow instructions from your doctor about going back to your normal activities.  Ask your doctor what activities are safe.  Protect your cut from the sun when you are outside for the first 6 months, or for as long as told by your doctor. Put on sunscreen around the scar or cover up the scar.  Keep all follow-up visits as told by your doctor. This is important. Contact a doctor if:  Your have more redness, swelling, or pain around the cut.  You have more fluid or blood coming from the cut.  Your cut feels warm to the touch.  You have pus or a bad smell coming from the cut.  You have a fever or shaking chills.  You feel sick to your stomach (nauseous) or you throw up (vomit).  You are dizzy.  Your stitches or staples come undone. Get help right away if:  You have a red streak coming from your cut.  Your cut bleeds through the bandage and the bleeding does not stop with gentle pressure.  The edges of your cut open  up and separate.  You have very bad (severe) pain.  You have a rash.  You are confused.  You pass out (faint).  You have trouble breathing and you have a fast heartbeat. This information is not intended to replace advice given to you by your health care provider. Make sure you discuss any questions you have with your health care provider. Document Released: 05/28/2011 Document Revised: 11/11/2015 Document Reviewed: 11/11/2015 Elsevier Interactive Patient Education  2017 [place] drinks for next 6 hours
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
## 2016-02-13 NOTE — Op Note (Signed)
DATE OF OPERATION: 02/13/2016  LOCATION: [facility]  PREOPERATIVE DIAGNOSIS: lower lip melanoma  POSTOPERATIVE DIAGNOSIS: Same  PROCEDURE: Re-excision of lower lip melanoma in-situ .7 x 4.5 cm  SURGEON: Quantavious Eggert Sanger Rushton Early, DO  EBL: minimal  CONDITION: Stable  COMPLICATIONS: None  INDICATION: The patient, [name], is a 82 y.o. male born on [birth year], is here for treatment of a melanoma of the lower lip.  Excision was done 2 weeks ago.  The path was not certain of the etiology prior to excision so conservative excisional margins were obtained.  The path confirmed melanoma in-situ.  He had positive margins at the time so he returns for re-excision.   PROCEDURE DETAILS:  The patient was seen prior to surgery and marked.  The IV antibiotics were given. The patient was taken to the operating room and given a general anesthetic. A standard time out was performed and all information was confirmed by those in the room. SCDs were placed.   Betadine was used for prep of the operative area.  The local was injected for intraoperative hemostasis and postoperative pain control.  The #15 blade was used to excise the area 7 mm from the incision site for a length of 4.5 cm.  The specimen was marked long stitch at 12 o'clock and short stitch at 9 o'clock.  The tip broke off at the 9 o'clock position and was sent as suck.  The incision was closed with 4-0 Vicryl vertical mattress sutures.  The patient was allowed to wake up and taken to recovery room in stable condition at the end of the case. The family was notified at the end of the case.

## 2016-02-13 NOTE — Progress Notes (Signed)
Notified Dr. Marla Roe of [name] CBG and his last insulin dose (today at 0400). Instructed to recheck patient's CBG in 30 minutes.

## 2016-02-13 NOTE — Anesthesia Preprocedure Evaluation (Addendum)
Anesthesia Evaluation  Patient identified by MRN, date of birth, ID band Patient awake    Reviewed: Allergy & Precautions, NPO status , Patient's Chart, lab work & pertinent test results, reviewed documented beta blocker date and time   History of Anesthesia Complications Negative for: history of anesthetic complications  Airway Mallampati: II  TM Distance: >3 FB Neck ROM: Full    Dental  (+) Poor Dentition, Missing   Pulmonary shortness of breath, former smoker,    breath sounds clear to auscultation       Cardiovascular hypertension, Pt. on home beta blockers and Pt. on medications + CAD, + Past MI, + Peripheral Vascular Disease and +CHF  Normal cardiovascular exam+ Cardiac Defibrillator  Rhythm:Regular Rate:Normal - Systolic murmurs and - Diastolic murmurs Study Conclusions  - Left ventricle: The cavity size was moderately dilated. Wall   thickness was increased in a pattern of mild LVH. The estimated   ejection fraction was 25%. Mid to apical anteroseptal and   inferoseptal akinesis, apical anterior/inferior/lateral akinesis,   akinesis of the true apex. No LV apical thrombus. Doppler   parameters are consistent with abnormal left ventricular   relaxation (grade 1 diastolic dysfunction). - Aortic valve: Trileaflet; moderately calcified leaflets. There   was mild stenosis. There was mild regurgitation. Mean gradient   (S): 14 mm Hg. - Mitral valve: There was mild regurgitation. - Left atrium: The atrium was mildly dilated. - Right ventricle: The cavity size was normal. Pacer wire or   catheter noted in right ventricle. Systolic function was mildly   reduced. - Right atrium: The atrium was mildly dilated. - Tricuspid valve: Peak RV-RA gradient (S): 25 mm Hg. - Systemic veins: IVC was poorly visualized.   Neuro/Psych PSYCHIATRIC DISORDERS Depression negative neurological ROS     GI/Hepatic negative GI ROS,    Endo/Other  diabetes  Renal/GU Renal InsufficiencyRenal disease     Musculoskeletal  (+) Arthritis ,   Abdominal   Peds  Hematology   Anesthesia Other Findings   Reproductive/Obstetrics                            Anesthesia Physical  Anesthesia Plan  ASA: IV  Anesthesia Plan: General   Post-op Pain Management:    Induction: Intravenous  Airway Management Planned: LMA  Additional Equipment:   Intra-op Plan:   Post-operative Plan: Extubation in OR  Informed Consent: I have reviewed the patients History and Physical, chart, labs and discussed the procedure including the risks, benefits and alternatives for the proposed anesthesia with the patient or authorized representative who has indicated his/her understanding and acceptance.   Dental advisory given  Plan Discussed with: CRNA, Anesthesiologist and Surgeon  Anesthesia Plan Comments:        Anesthesia Quick Evaluation

## 2016-02-13 NOTE — Anesthesia Procedure Notes (Signed)
Procedure Name: LMA Insertion Performed by: Gean Maidens Pre-anesthesia Checklist: Patient identified, Emergency Drugs available, Suction available, Patient being monitored and Timeout performed Patient Re-evaluated:Patient Re-evaluated prior to inductionOxygen Delivery Method: Circle system utilized Preoxygenation: Pre-oxygenation with 100% oxygen Intubation Type: IV induction Ventilation: Mask ventilation without difficulty LMA: LMA inserted LMA Size: 4.0 Number of attempts: 1 Placement Confirmation: positive ETCO2,  CO2 detector and breath sounds checked- equal and bilateral Tube secured with: Tape

## 2016-02-13 NOTE — Anesthesia Postprocedure Evaluation (Signed)
Anesthesia Post Note  Patient: [name]  Procedure(s) Performed: Procedure(s) (LRB): RE-EXCISION OF MELANOMA IN SITU (N/A)  Patient location during evaluation: PACU Anesthesia Type: General Level of consciousness: sedated Pain management: pain level controlled Vital Signs Assessment: post-procedure vital signs reviewed and stable Respiratory status: spontaneous breathing and respiratory function stable Cardiovascular status: stable Anesthetic complications: no    Last Vitals:  Vitals:   02/13/16 0845 02/13/16 0856  BP: (!) 143/69 132/62  Pulse: (!) 59 60  Resp: 15 14  Temp: 36.4 C 36.4 C    Last Pain:  Vitals:   02/13/16 0845  TempSrc:   PainSc: 0-No pain                 Lisel Siegrist DANIEL

## 2016-02-13 NOTE — Interval H&P Note (Signed)
History and Physical Interval Note:  02/13/2016 7:06 AM  [name]  has presented today for surgery, with the diagnosis of lip melanoma  The various methods of treatment have been discussed with the patient and family. After consideration of risks, benefits and other options for treatment, the patient has consented to  Procedure(s): RE-EXCISION OF MELANOMA IN SITU (N/A) as a surgical intervention .  The patient's history has been reviewed, patient examined, no change in status, stable for surgery.  I have reviewed the patient's chart and labs.  Questions were answered to the patient's satisfaction.     Wallace Going

## 2016-02-14 ENCOUNTER — Other Ambulatory Visit: Payer: Self-pay

## 2016-02-14 ENCOUNTER — Encounter (HOSPITAL_COMMUNITY): Payer: Self-pay | Admitting: Plastic Surgery

## 2016-02-14 DIAGNOSIS — I2510 Atherosclerotic heart disease of native coronary artery without angina pectoris: Secondary | ICD-10-CM

## 2016-02-15 MED ORDER — AMIODARONE HCL 200 MG PO TABS: 200.0000 mg | ORAL_TABLET | Freq: Every day | ORAL | 1 refills | Status: DC

## 2016-02-15 MED ORDER — APIXABAN 2.5 MG PO TABS: 2.5000 mg | ORAL_TABLET | Freq: Two times a day (BID) | ORAL | 1 refills | Status: DC

## 2016-02-15 MED ORDER — ATORVASTATIN CALCIUM 40 MG PO TABS: 40.0000 mg | ORAL_TABLET | Freq: Every day | ORAL | 1 refills | Status: DC

## 2016-02-16 ENCOUNTER — Other Ambulatory Visit (HOSPITAL_COMMUNITY): Payer: Self-pay | Admitting: *Deleted

## 2016-02-16 MED ORDER — POTASSIUM CHLORIDE CRYS ER 20 MEQ PO TBCR: 40.0000 meq | EXTENDED_RELEASE_TABLET | Freq: Every day | ORAL | 3 refills | Status: DC

## 2016-02-21 ENCOUNTER — Other Ambulatory Visit (HOSPITAL_COMMUNITY): Payer: Self-pay | Admitting: *Deleted

## 2016-02-21 MED ORDER — CARVEDILOL 6.25 MG PO TABS: 9.3750 mg | ORAL_TABLET | Freq: Two times a day (BID) | ORAL | 3 refills | Status: DC

## 2016-02-22 ENCOUNTER — Ambulatory Visit: Payer: Medicare Other | Admitting: Podiatry

## 2016-02-23 ENCOUNTER — Ambulatory Visit (INDEPENDENT_AMBULATORY_CARE_PROVIDER_SITE_OTHER): Payer: Medicare Other | Admitting: Podiatry

## 2016-02-23 ENCOUNTER — Other Ambulatory Visit (HOSPITAL_COMMUNITY): Payer: Self-pay | Admitting: *Deleted

## 2016-02-23 DIAGNOSIS — E1140 Type 2 diabetes mellitus with diabetic neuropathy, unspecified: Secondary | ICD-10-CM | POA: Diagnosis not present

## 2016-02-23 DIAGNOSIS — B351 Tinea unguium: Secondary | ICD-10-CM

## 2016-02-23 DIAGNOSIS — M79676 Pain in unspecified toe(s): Secondary | ICD-10-CM | POA: Diagnosis not present

## 2016-02-23 DIAGNOSIS — Q828 Other specified congenital malformations of skin: Secondary | ICD-10-CM | POA: Diagnosis not present

## 2016-02-23 MED ORDER — CARVEDILOL 6.25 MG PO TABS: 9.3750 mg | ORAL_TABLET | Freq: Two times a day (BID) | ORAL | 3 refills | Status: DC

## 2016-02-23 NOTE — Progress Notes (Signed)
Patient ID: [name], male   DOB: 1928/07/15, 82 y.o.   MRN: [id number] Complaint:  Visit Type: Patient returns to my office for continued preventative foot care services. Complaint: Patient states" my nails have grown long and thick and become painful to walk and wear shoes" Patient has been diagnosed with DM with neuropathy.[name] He presents for preventative foot care services. No changes to ROS.  He also has painful callus under the outside ball of both feet.  Podiatric Exam: Vascular: dorsalis pedis and posterior tibial pulses are palpable bilateral. Capillary return is immediate. Temperature gradient is WNL. Skin turgor WNL  Sensorium: Diminished  Semmes Weinstein monofilament test. Normal tactile sensation bilaterally. Nail Exam: Pt has thick disfigured discolored nails with subungual debris noted bilateral entire nail hallux through fifth toenails Ulcer Exam: There is no evidence of ulcer or pre-ulcerative changes or infection. Orthopedic Exam: Muscle tone and strength are WNL. No limitations in general ROM. No crepitus or effusions noted. Foot type and digits show no abnormalities. Bony prominences are unremarkable. Asymptomatic HAV B/L. Skin:  Porokeratosis sub 5th metatarsal left foot. No infection or ulcers.  Asymptomatic Callus both feet.  Diagnosis:  Tinea unguium, Pain in right toe, pain in left toes, Porokeratosis  Treatment & Plan Procedures and Treatment: Consent by patient was obtained for treatment procedures. The patient understood the discussion of treatment and procedures well. All questions were answered thoroughly reviewed. Debridement of mycotic and hypertrophic toenails, 1 through 5 bilateral and clearing of subungual debris. No ulceration, no infection noted.  Debride porokeratosis Return Visit-Office Procedure: Patient instructed to return to the office for a follow up visit 3 months for continued evaluation and treatment.   Gardiner Barefoot DPM

## 2016-02-24 ENCOUNTER — Other Ambulatory Visit: Payer: Self-pay | Admitting: *Deleted

## 2016-02-24 MED ORDER — CARVEDILOL 6.25 MG PO TABS: 9.3750 mg | ORAL_TABLET | Freq: Two times a day (BID) | ORAL | 3 refills | Status: DC

## 2016-03-05 ENCOUNTER — Telehealth: Payer: Self-pay

## 2016-03-05 NOTE — Telephone Encounter (Signed)
Pt's wife called [facility] regarding pt having low grade fever and sleeping a lot. They were advised on home care.  LMTCB to check on pt   Date/Time Eilene Ghazi Time): 03/04/2016 4:51:57 PM Patient Name: [name] Gender: Male DOB: 1928-10-17 Age: 82 Y 68 M 7 D Return Phone Number: [phone] (Primary) Address: [address] City/State/Zip: [place] [address] Client [facility] Night - Client Client Site [facility] - Night Physician Shanon Ace - MD Contact Type Call Who Is Calling Patient / Member / Family / Caregiver Call Type Triage / Clinical Caller Name Doren Kaspar Relationship To Patient Spouse Return Phone Number [phone] (Primary) Chief Complaint Fatigue (>THREE MONTHS) Reason for Call Symptomatic / Request for [place] their husband has a fever 99.6 and feeling weak and sleeping late.

## 2016-03-05 NOTE — Telephone Encounter (Signed)
Spoke with pt and he states that he is feeling better. He does not want appt at this time. He will call back if symptoms get worse. Nothing further needed at this time.

## 2016-03-06 ENCOUNTER — Ambulatory Visit (INDEPENDENT_AMBULATORY_CARE_PROVIDER_SITE_OTHER): Payer: Medicare Other

## 2016-03-06 ENCOUNTER — Telehealth: Payer: Self-pay | Admitting: Internal Medicine

## 2016-03-06 ENCOUNTER — Ambulatory Visit (INDEPENDENT_AMBULATORY_CARE_PROVIDER_SITE_OTHER): Payer: Medicare Other | Admitting: Physician Assistant

## 2016-03-06 VITALS — BP 118/78 | HR 70 | Temp 98.2°F | Resp 17 | Ht 74.0 in | Wt 198.0 lb

## 2016-03-06 DIAGNOSIS — I255 Ischemic cardiomyopathy: Secondary | ICD-10-CM

## 2016-03-06 DIAGNOSIS — J320 Chronic maxillary sinusitis: Secondary | ICD-10-CM

## 2016-03-06 DIAGNOSIS — E119 Type 2 diabetes mellitus without complications: Secondary | ICD-10-CM | POA: Diagnosis not present

## 2016-03-06 DIAGNOSIS — R52 Pain, unspecified: Secondary | ICD-10-CM

## 2016-03-06 DIAGNOSIS — R6883 Chills (without fever): Secondary | ICD-10-CM

## 2016-03-06 DIAGNOSIS — I517 Cardiomegaly: Secondary | ICD-10-CM | POA: Diagnosis not present

## 2016-03-06 LAB — POCT CBC
GRANULOCYTE PERCENT: 86.8 % — AB (ref 37–80)
HCT, POC: 34.3 % — AB (ref 43.5–53.7)
Hemoglobin: 11.6 g/dL — AB (ref 14.1–18.1)
Lymph, poc: 1.5 (ref 0.6–3.4)
MCH: 27.2 pg (ref 27–31.2)
MCHC: 33.9 g/dL (ref 31.8–35.4)
MCV: 80.2 fL (ref 80–97)
MID (CBC): 0.1 (ref 0–0.9)
MPV: 8.7 fL (ref 0–99.8)
PLATELET COUNT, POC: 157 10*3/uL (ref 142–424)
POC Granulocyte: 11 — AB (ref 2–6.9)
POC LYMPH PERCENT: 12.2 %L (ref 10–50)
POC MID %: 1 % (ref 0–12)
RBC: 4.27 M/uL — AB (ref 4.69–6.13)
RDW, POC: 16.7 %
WBC: 12.7 10*3/uL — AB (ref 4.6–10.2)

## 2016-03-06 LAB — POCT URINALYSIS DIP (MANUAL ENTRY)
Bilirubin, UA: NEGATIVE
Glucose, UA: NEGATIVE
Ketones, POC UA: NEGATIVE
Leukocytes, UA: NEGATIVE
Nitrite, UA: NEGATIVE
PH UA: 5.5
Protein Ur, POC: NEGATIVE
RBC UA: NEGATIVE
UROBILINOGEN UA: 0.2

## 2016-03-06 LAB — POC MICROSCOPIC URINALYSIS (UMFC): Mucus: ABSENT

## 2016-03-06 LAB — GLUCOSE, POCT (MANUAL RESULT ENTRY): POC Glucose: 137 mg/dl — AB (ref 70–99)

## 2016-03-06 LAB — POCT INFLUENZA A/B
Influenza A, POC: NEGATIVE
Influenza B, POC: NEGATIVE

## 2016-03-06 MED ORDER — AMOXICILLIN-POT CLAVULANATE 875-125 MG PO TABS: 1.0000 | ORAL_TABLET | Freq: Two times a day (BID) | ORAL | 0 refills | Status: DC

## 2016-03-06 NOTE — Patient Instructions (Addendum)
Take antibiotics as prescribed. Take a probiotic along with this. Follow up in 3 days for repeat CBC. If symptoms start to worsen, seek care sooner.   IF you received an x-ray today, you will receive an invoice from [facility]. Please contact [facility] at [phone] with questions or concerns regarding your invoice.   IF you received labwork today, you will receive an invoice from [facility]. Please contact [facility] at [phone] with questions or concerns regarding your invoice.   Our billing staff will not be able to assist you with questions regarding bills from these companies.  You will be contacted with the lab results as soon as they are available. The fastest way to get your results is to activate your My Chart account. Instructions are located on the last page of this paperwork. If you have not heard from [place] regarding the results in 2 weeks, please contact this office.     sin Sinusitis, Adult Sinusitis is soreness and inflammation of your sinuses. Sinuses are hollow spaces in the bones around your face. They are located:  Around your eyes.  In the middle of your forehead.  Behind your nose.  In your cheekbones. Your sinuses and nasal passages are lined with a stringy fluid (mucus). Mucus normally drains out of your sinuses. When your nasal tissues get inflamed or swollen, the mucus can get trapped or blocked so air cannot flow through your sinuses. This lets bacteria, viruses, and funguses grow, and that leads to infection. Follow these instructions at home: Medicines  Take, use, or apply over-the-counter and prescription medicines only as told by your doctor. These may include nasal sprays.  If you were prescribed an antibiotic medicine, take it as told by your doctor. Do not stop taking the antibiotic even if you start to feel better. Hydrate and Humidify  Drink enough water to keep your pee (urine) clear or pale yellow.  Use a cool mist  humidifier to keep the humidity level in your home above 50%.  Breathe in steam for 10-15 minutes, 3-4 times a day or as told by your doctor. You can do this in the bathroom while a hot shower is running.  Try not to spend time in cool or dry air. Rest  Rest as much as possible.  Sleep with your head raised (elevated).  Make sure to get enough sleep each night. General instructions  Put a warm, moist washcloth on your face 3-4 times a day or as told by your doctor. This will help with discomfort.  Wash your hands often with soap and water. If there is no soap and water, use hand sanitizer.  Do not smoke. Avoid being around people who are smoking (secondhand smoke).  Keep all follow-up visits as told by your doctor. This is important. Contact a doctor if:  You have a fever.  Your symptoms get worse.  Your symptoms do not get better within 10 days. Get help right away if:  You have a very bad headache.  You cannot stop throwing up (vomiting).  You have pain or swelling around your face or eyes.  You have trouble seeing.  You feel confused.  Your neck is stiff.  You have trouble breathing. This information is not intended to replace advice given to you by your health care provider. Make sure you discuss any questions you have with your health care provider. Document Released: 08/22/2007 Document Revised: 10/30/2015 Document Reviewed: 12/29/2014 Elsevier Interactive Patient Education  2017 [employer].

## 2016-03-06 NOTE — Progress Notes (Signed)
LAKEITH CAREAGA  MRN: [id number] DOB: 1928-10-26  Subjective:  [name] is a 82 y.o. male seen in office today for a chief complaint of generalized body aches x 3 days. Has associated chills and nasal congestion. Denies cough, fever, sore throat, sweating, nausea, vomiting, and diarrhea. Has tried tyelnol with no relief. Has no sick contact with anyone at home.  No history of allergies, asthma, or COPD. Pt quit smoking >30 years ago. Denies alcohol use.   Review of Systems  Constitutional: Positive for appetite change (decreased ).  HENT: Negative for sinus pain and sinus pressure.   Respiratory: Positive for shortness of breath ( worse when he walks around). Negative for wheezing.   Cardiovascular: Negative for chest pain and palpitations.  Neurological: Negative for dizziness, weakness and headaches.    Patient Active Problem List   Diagnosis Date Noted  . Melanoma of skin ([place]) 01/23/2016  . PAD (peripheral artery disease) ([place]) 09/27/2015  . Sciatica 08/23/2014  . Senile ecchymosis 08/23/2014  . Atrial fibrillation ([place]) 07/28/2014  . CHF exacerbation ([place])   . Hypokalemia   . Abdominal distention   . Cardiomyopathy, ischemic   . Chronic kidney disease, stage III (moderate)   . Diabetes type 2, controlled ([place])   . Depression   . HCAP (healthcare-associated pneumonia) 05/18/2014  . Acute on chronic combined systolic and diastolic congestive heart failure ([place])   . Acute respiratory failure with hypoxia ([place])   . Diabetes mellitus type 2, controlled ([place]) 04/28/2014  . Chronic kidney disease 04/28/2014  . Leucocytosis 04/28/2014  . AKI (acute kidney injury) ([place]) 04/28/2014  . Acute on chronic systolic congestive heart failure ([place])   . CHF (congestive heart failure) ([place]) 04/27/2014  . Shortness of breath 03/25/2014  . Weight gain 03/25/2014  . Leg edema, left 03/25/2014  . Paroxysmal atrial fibrillation ([place]) 03/05/2014  . Acute renal failure ([place]) 03/02/2014    . CKD (chronic kidney disease) stage 3, GFR 30-59 ml/min 03/02/2014  . Hyperlipemia 03/02/2014  . Hyperkalemia 03/02/2014  . Essential hypertension, benign 03/02/2014  . Cellulitis of left lower extremity 03/01/2014  . Cellulitis 03/01/2014  . Medication management 07/30/2013  . Encounter for fitting or adjustment of automatic implantable cardioverter-defibrillator 04/16/2013  . Corns/callosities 04/02/2013  . Iron deficiency anemia 09/03/2012  . Medically complex patient 09/03/2012  . Nocturnal leg movements 06/23/2012  . Sleep difficulties 01/12/2012  . Back pain, sacroiliac 01/12/2012  . Renal insufficiency 09/25/2011  . Diabetic neuropathy, type II diabetes mellitus ([place]) 08/14/2011  . Leg pain thigh with exercise  08/14/2011  . Memory problem 08/14/2011  . Hearing impaired hearing aids 08/14/2011  . Neuropathy ([place]) 08/14/2011  . Fatigue 08/14/2011  . CAD (coronary artery disease) 11/13/2010  . Ischemic cardiomyopathy   . Autoimmune hemolytic anemias 01/04/2010  . ENLARGEMENT OF LYMPH NODES 01/04/2010  . WEIGHT LOSS 01/03/2010  . UNS ADVRS EFF OTH RX MEDICINAL&BIOLOGICAL SBSTNC 07/27/2009  . Obstructive sleep apnea 07/19/2009  . ARTHRITIS, HIP 04/20/2009  . Cerebrovascular disease 01/27/2009  . SYSTOLIC HEART FAILURE, CHRONIC 07/20/2008  . Hypothyroidism 07/14/2008  . RESTLESS LEG SYNDROME 07/14/2008  . NOCTURIA 07/14/2008  . Hyperlipidemia 09/25/2006  . Essential hypertension 09/25/2006    Current Outpatient Prescriptions on File Prior to Visit  Medication Sig Dispense Refill  . acetaminophen (TYLENOL) 500 MG tablet Take 500 mg by mouth daily as needed for mild pain.    [name] amiodarone (PACERONE) 200 MG tablet Take 1 tablet (200 mg total) by mouth daily.  90 tablet 1  . apixaban (ELIQUIS) 2.5 MG TABS tablet Take 1 tablet (2.5 mg total) by mouth 2 (two) times daily. 180 tablet 1  . atorvastatin (LIPITOR) 40 MG tablet Take 1 tablet (40 mg total) by mouth daily. 90 tablet  1  . carvedilol (COREG) 6.25 MG tablet Take 1.5 tablets (9.375 mg total) by mouth 2 (two) times daily with a meal. 90 tablet 3  . DIGOX 125 MCG tablet TAKE 1/2 TABLET(0.063 MG) BY MOUTH DAILY 15 tablet 3  . escitalopram (LEXAPRO) 10 MG tablet Take 1 tablet (10 mg total) by mouth daily. (Patient taking differently: Take 10 mg by mouth at bedtime. ) 90 tablet 3  . gabapentin (NEURONTIN) 100 MG capsule Take 1 capsule (100 mg total) by mouth 3 (three) times daily. Can increase as directed (Patient taking differently: Take 100 mg by mouth daily as needed (pain and sleep). Can increase as directed) 90 capsule 3  . glipiZIDE (GLUCOTROL) 10 MG tablet Take 1 tablet (10 mg total) by mouth daily. 180 tablet 1  . glucose blood (ONE TOUCH TEST STRIPS) test strip Use to test blood sugar 2-3 times daily 300 each 1  . hydrALAZINE (APRESOLINE) 25 MG tablet Take 1 tablet (25 mg total) by mouth every 8 (eight) hours. 360 tablet 3  . Insulin Detemir (LEVEMIR) 100 UNIT/ML Pen Inject 18 Units into the skin at bedtime. as directed (Patient taking differently: Inject 24 Units into the skin at bedtime. ) 2 pen 0  . isosorbide mononitrate (IMDUR) 30 MG 24 hr tablet Take 1 tablet (30 mg total) by mouth daily. 90 tablet 3  . Menthol, Topical Analgesic, (ICY HOT EX) Apply 1 application topically at bedtime as needed (leg pain).    . Multiple Vitamin (MULTIVITAMIN WITH MINERALS) TABS tablet Take 1 tablet by mouth daily.    Glory Rosebush DELICA LANCETS FINE MISC Use to test blood sugar 2-3 times daily 300 each 1  . polyethylene glycol (MIRALAX) packet Take 17 g by mouth daily. (Patient taking differently: Take 17 g by mouth daily as needed for mild constipation. ) 14 each 0  . potassium chloride SA (K-DUR,KLOR-CON) 20 MEQ tablet Take 2 tablets (40 mEq total) by mouth daily. 180 tablet 3  . pramipexole (MIRAPEX) 0.5 MG tablet TAKE 2-3 HOURS BEFORE SLEEPFOR RESTLESS LEG (Patient taking differently: TAKE 0.5mg  2-3 HOURS BEFORE SLEEPFOR  RESTLESS LEG) 90 tablet 1  . saxagliptin HCl (ONGLYZA) 5 MG TABS tablet Take 1 tablet (5 mg total) by mouth daily. 90 tablet 3  . silodosin (RAPAFLO) 8 MG CAPS capsule Take 1 capsule (8 mg total) by mouth daily with breakfast. 90 capsule 1  . spironolactone (ALDACTONE) 25 MG tablet Take 1 tablet (25 mg total) by mouth daily. (Patient taking differently: Take 25 mg by mouth daily at 12 noon. ) 90 tablet 3  . torsemide (DEMADEX) 20 MG tablet Take 3 tablets (60 mg total) by mouth daily. Take extra as directed for wt gain 120 tablet 0   No current facility-administered medications on file prior to visit.     No Known Allergies     Social History   Social History  . Marital status: Married    Spouse name: N/A  . Number of children: N/A  . Years of education: N/A   Occupational History  . Not on file.   Social History Main Topics  . Smoking status: Former Smoker    Packs/day: 1.00    Years: 40.00    Types:  Cigarettes    Quit date: 03/19/1978  . Smokeless tobacco: Former Systems developer    Types: Chew  . Alcohol use No  . Drug use: No  . Sexual activity: No   Other Topics Concern  . Not on file   Social History Narrative   hhof 2 married   No pets     In [place] for over 70 years.       Retired Education officer, museum.   Served [place] s    Objective:  BP 118/78 (BP Location: Right Arm, Patient Position: Sitting, Cuff Size: Normal)   Pulse 70   Temp 98.2 F (36.8 C) (Oral)   Resp 17   Ht 6\' 2"  (1.88 m)   Wt 198 lb (89.8 kg)   SpO2 97%   BMI 25.42 kg/m   Physical Exam  Constitutional: He is oriented to person, place, and time and well-developed, well-nourished, and in no distress.  HENT:  Head: Normocephalic and atraumatic.  Right Ear: Ear canal normal.  Nose: Mucosal edema and rhinorrhea present. Right sinus exhibits maxillary sinus tenderness. Right sinus exhibits no frontal sinus tenderness. Left sinus exhibits maxillary sinus tenderness. Left sinus exhibits no frontal  sinus tenderness.  Eyes: Conjunctivae are normal.  Neck: Normal range of motion.  Pulmonary/Chest: Effort normal and breath sounds normal.  Lymphadenopathy:       Head (right side): No submental, no submandibular, no tonsillar, no preauricular, no posterior auricular and no occipital adenopathy present.       Head (left side): No submental, no submandibular, no tonsillar, no preauricular, no posterior auricular and no occipital adenopathy present.    He has no cervical adenopathy.       Right: No supraclavicular adenopathy present.       Left: No supraclavicular adenopathy present.  Neurological: He is alert and oriented to person, place, and time. Gait normal.  Skin: Skin is warm and dry.  Psychiatric: Affect normal.  Vitals reviewed.    Results for orders placed or performed in visit on 03/06/16 (from the past 24 hour(s))  POCT CBC     Status: Abnormal   Collection Time: 03/06/16  5:55 PM  Result Value Ref Range   WBC 12.7 (A) 4.6 - 10.2 K/uL   Lymph, poc 1.5 0.6 - 3.4   POC LYMPH PERCENT 12.2 10 - 50 %L   MID (cbc) 0.1 0 - 0.9   POC MID % 1.0 0 - 12 %M   POC Granulocyte 11.0 (A) 2 - 6.9   Granulocyte percent 86.8 (A) 37 - 80 %G   RBC 4.27 (A) 4.69 - 6.13 M/uL   Hemoglobin 11.6 (A) 14.1 - 18.1 g/dL   HCT, POC 34.3 (A) 43.5 - 53.7 %   MCV 80.2 80 - 97 fL   MCH, POC 27.2 27 - 31.2 pg   MCHC 33.9 31.8 - 35.4 g/dL   RDW, POC 16.7 %   Platelet Count, POC 157 142 - 424 K/uL   MPV 8.7 0 - 99.8 fL  POCT urinalysis dipstick     Status: None   Collection Time: 03/06/16  6:11 PM  Result Value Ref Range   Color, UA yellow yellow   Clarity, UA clear clear   Glucose, UA negative negative   Bilirubin, UA negative negative   Ketones, POC UA negative negative   Spec Grav, UA <=1.005    Blood, UA negative negative   pH, UA 5.5    Protein Ur, POC negative negative  Urobilinogen, UA 0.2    Nitrite, UA Negative Negative   Leukocytes, UA Negative Negative  POCT Microscopic Urinalysis  ([facility])     Status: None   Collection Time: 03/06/16  6:12 PM  Result Value Ref Range   WBC,UR,HPF,POC None None WBC/hpf   RBC,UR,HPF,POC None None RBC/hpf   Bacteria None None, Too numerous to count   Mucus Absent Absent   Epithelial Cells, UR Per Microscopy None None, Too numerous to count cells/hpf  POCT Influenza A/B     Status: None   Collection Time: 03/06/16  6:20 PM  Result Value Ref Range   Influenza A, POC Negative Negative   Influenza B, POC Negative Negative  POCT glucose (manual entry)     Status: Abnormal   Collection Time: 03/06/16  6:49 PM  Result Value Ref Range   POC Glucose 137 (A) 70 - 99 mg/dl   Dg Chest 2 View  Result Date: 03/06/2016 CLINICAL DATA:  Body aches for 3 days. EXAM: CHEST  2 VIEW COMPARISON:  None. FINDINGS: There is moderate cardiomegaly. Transvenous cardiac leads appear intact. Prior sternotomy and CABG. Mild pulmonary hyperinflation. No airspace consolidation. No effusions. Normal pulmonary vasculature. Unremarkable hilar and mediastinal contours. IMPRESSION: Hyperinflation and cardiomegaly.  No consolidation.  No effusions. Electronically Signed   By: Andreas Newport M.D.   On: 03/06/2016 18:07    Assessment and Plan :  This case was precepted with Dr. Everlene Farrier.  1. Body aches - POCT urinalysis dipstick - POCT Microscopic Urinalysis ([facility]) - POCT CBC - DG Chest 2 View; Future - POCT Influenza A/B - POCT glucose (manual entry)  2. Chills - POCT CBC  3. Controlled type 2 diabetes mellitus without complication, unspecified long term insulin use status (HCC) - POCT glucose (manual entry)  4. Maxillary sinusitis, unspecified chronicity -Pt is high risk due to comorbidities. Will treat with Augmentin and have him follow up in 3 days for repeat CBC. If symptoms worsen before then, seek care immediately.  - amoxicillin-clavulanate (AUGMENTIN) 875-125 MG tablet; Take 1 tablet by mouth 2 (two) times daily.  Dispense: 14 tablet; Refill:  0  Tenna Delaine PA-C  [facility] 03/06/2016 6:54 PM

## 2016-03-06 NOTE — Telephone Encounter (Signed)
Patient Name: [name]  DOB: Oct 15, 1928    Initial Comment Caller states c/o joint pain and fatigue, requests Rx.   Nurse Assessment  Nurse: Leilani Merl, RN, Heather Date/Time (Eastern Time): 03/06/2016 2:36:07 PM  Confirm and document reason for call. If symptomatic, describe symptoms. ---Caller states that her husband is having fatigue and joint pain since Sunday.  Does the patient have any new or worsening symptoms? ---Yes  Will a triage be completed? ---Yes  Related visit to physician within the last 2 weeks? ---No  Does the PT have any chronic conditions? (i.e. diabetes, asthma, etc.) ---Yes  List chronic conditions. ---See MR  Is this a behavioral health or substance abuse call? ---No     Guidelines    Guideline Title Affirmed Question Affirmed Notes  Weakness (Generalized) and Fatigue [1] MODERATE weakness (i.e., interferes with work, school, normal activities) AND [2] cause unknown (Exceptions: weakness with acute minor illness, or weakness from poor fluid intake)    Final Disposition User   See Physician within 4 Hours (or PCP triage) Facilities manager, RN, Conservator, museum/gallery states that he missed a call from the nurse  Caller states that the office made him an appt for tomorrow at 330 in case he wanted to come in then, but he does not feel like coming in today.   Referrals  GO TO FACILITY REFUSED   Disagree/Comply: Disagree  Disagree/Comply Reason: Disagree with instructions

## 2016-03-06 NOTE — Telephone Encounter (Signed)
Spoke with pt's wife, she reports that pt c/o aching all over and increased fatigue. Pt does not have f/n/v. Pt just reports "he doesn't feel good". Advised wife that pt needs to keep appt tomorrow for evaluation of symptoms and possible treatment if indicated. Also advised that if symptoms worsen she can call [facility] or take pt to ED. She voiced understanding. Nothing further needed at this time.

## 2016-03-07 ENCOUNTER — Telehealth (HOSPITAL_COMMUNITY): Payer: Self-pay | Admitting: *Deleted

## 2016-03-07 ENCOUNTER — Ambulatory Visit: Payer: Medicare Other | Admitting: Family Medicine

## 2016-03-07 MED ORDER — ISOSORBIDE MONONITRATE ER 30 MG PO TB24: 30.0000 mg | ORAL_TABLET | Freq: Every day | ORAL | 3 refills | Status: DC

## 2016-03-07 MED ORDER — HYDRALAZINE HCL 25 MG PO TABS
25.0000 mg | ORAL_TABLET | Freq: Three times a day (TID) | ORAL | 3 refills | Status: DC
Start: 2016-03-07 — End: 2017-01-23

## 2016-03-07 MED ORDER — TORSEMIDE 20 MG PO TABS: 60.0000 mg | ORAL_TABLET | Freq: Every day | ORAL | 3 refills | Status: DC

## 2016-03-07 NOTE — Telephone Encounter (Signed)
Mrs. Hartis called in asking for an appointment for patient to see Dr. [name].  Patient has been having body aches and lethargy for 3 days now.  Went to Urgent care yesterday and was prescribed an antibiotic.    Patient is not having any shortness of breath or weight gain and explained that his symptoms are not related to his heart failure and to contact his PCP.  Patient was on the call-back list in January for his 6 month f/u.  Went ahead and scheduled him to see Dr. [name] in January.  Also sent in refills as requested.  No further questions at this time.

## 2016-03-09 ENCOUNTER — Ambulatory Visit (INDEPENDENT_AMBULATORY_CARE_PROVIDER_SITE_OTHER): Payer: Medicare Other | Admitting: Physician Assistant

## 2016-03-09 VITALS — BP 110/68 | HR 74 | Temp 98.4°F | Resp 18 | Ht 74.0 in | Wt 196.0 lb

## 2016-03-09 DIAGNOSIS — D72829 Elevated white blood cell count, unspecified: Secondary | ICD-10-CM | POA: Diagnosis not present

## 2016-03-09 DIAGNOSIS — D649 Anemia, unspecified: Secondary | ICD-10-CM | POA: Diagnosis not present

## 2016-03-09 DIAGNOSIS — J320 Chronic maxillary sinusitis: Secondary | ICD-10-CM

## 2016-03-09 DIAGNOSIS — I255 Ischemic cardiomyopathy: Secondary | ICD-10-CM

## 2016-03-09 LAB — POCT CBC
GRANULOCYTE PERCENT: 73.9 % (ref 37–80)
HEMATOCRIT: 33.5 % — AB (ref 43.5–53.7)
HEMOGLOBIN: 11.4 g/dL — AB (ref 14.1–18.1)
Lymph, poc: 1.9 (ref 0.6–3.4)
MCH, POC: 27.1 pg (ref 27–31.2)
MCHC: 34.2 g/dL (ref 31.8–35.4)
MCV: 79.2 fL — AB (ref 80–97)
MID (cbc): 0.6 (ref 0–0.9)
MPV: 8.5 fL (ref 0–99.8)
PLATELET COUNT, POC: 168 10*3/uL (ref 142–424)
POC GRANULOCYTE: 6.9 (ref 2–6.9)
POC LYMPH %: 19 % (ref 10–50)
POC MID %: 6.2 %M (ref 0–12)
RBC: 4.22 M/uL — AB (ref 4.69–6.13)
RDW, POC: 16.1 %
WBC: 9.3 10*3/uL (ref 4.6–10.2)

## 2016-03-09 NOTE — Patient Instructions (Addendum)
  Your WBC has decreased to normal range which is great. Continue taking antibiotics. Please return if you have any further issues.   Thank you for letting me participate in your health and well being. Happy Holidays!    IF you received an x-ray today, you will receive an invoice from [facility]. Please contact [facility] at [phone] with questions or concerns regarding your invoice.   IF you received labwork today, you will receive an invoice from [facility]. Please contact [facility] at [phone] with questions or concerns regarding your invoice.   Our billing staff will not be able to assist you with questions regarding bills from these companies.  You will be contacted with the lab results as soon as they are available. The fastest way to get your results is to activate your My Chart account. Instructions are located on the last page of this paperwork. If you have not heard from [place] regarding the results in 2 weeks, please contact this office.

## 2016-03-09 NOTE — Progress Notes (Signed)
MRN: [id number] DOB: 1928-11-10  Subjective:   [name] is a 81 y.o. male presenting for chief complaint of Follow-up (CHILLS/BODYACHES)  Initially seen on 03/06/16 for body aches, chills, SOB, and nasal congestion. Physical exam revealed mucosal edema and rhinorrhea and maxillary sinus tenderness. CXR and UA was negative. WBC elevated at 12.7 with elevated granulocytes. Given Augmentin and instructed to return in 3 days for reevaluation. Pt notes he is much better. He has been taken antibiotics as prescribed. His chills are much improved. His sinus headache and congestion has completely improved.   [name] has a current medication list which includes the following prescription(s): acetaminophen, amiodarone, amoxicillin-clavulanate, apixaban, atorvastatin, carvedilol, digox, escitalopram, gabapentin, glipizide, glucose blood, hydralazine, insulin detemir, isosorbide mononitrate, menthol (topical analgesic), multivitamin with minerals, onetouch delica lancets fine, polyethylene glycol, potassium chloride sa, pramipexole, saxagliptin hcl, silodosin, spironolactone, and torsemide. Also has No Known Allergies.  [name]  has a past medical history of AICD (automatic cardioverter/defibrillator) present (05/2013); Arthritis; Atrial fibrillation ([place]); Autoimmune hemolytic anemias; Cellulitis and abscess of lower extremity (03/02/2014); CEREBROVASCULAR DISEASE; CHF (congestive heart failure) ([place]); CKD (chronic kidney disease); Depression; DIABETES MELLITUS, TYPE II; Enlargement of lymph nodes; Hearing aid worn; frostbite; HYPERLIPIDEMIA; HYPERTENSION; Ischemic cardiomyopathy; Myocardial infarction (2005); Nocturia; OSA on CPAP (07/19/2009); RESTLESS LEG SYNDROME; Skin cancer; Skin cancer of face; VITAMIN D DEFICIENCY; Wears glasses; and WEIGHT LOSS. Also  has a past surgical history that includes Cataract extraction w/ intraocular lens  implant, bilateral (Bilateral, 1980's); Cardiac defibrillator placement  (2005); Ventricular resection / repair aneurysm (Left, 2005); Bi-ventricular implantable cardioverter defibrillator  (crt-d) (05/20/2013); Cholecystectomy (1982); Appendectomy (1982); Coronary artery bypass graft (2005); Mohs surgery (Right, ~ 2007); Cardioversion (N/A, 07/20/2013); Cardioversion (N/A, 09/28/2013); implantable cardioverter defibrillator (icd) generator change (N/A, 05/20/2013); bi-ventricular implantable cardioverter defibrillator upgrade (N/A, 05/20/2013); Cardioversion (N/A, 05/20/2014); Cardiac catheterization; Colonoscopy w/ biopsies and polypectomy; Lipoma excision (Left, 01/23/2016); and Mass excision (N/A, 02/13/2016).   Objective:   Vitals: BP 110/68 (BP Location: Right Arm, Patient Position: Sitting, Cuff Size: Small)   Pulse 74   Temp 98.4 F (36.9 C) (Oral)   Resp 18   Ht 6\' 2"  (1.88 m)   Wt 196 lb (88.9 kg)   SpO2 96%   BMI 25.16 kg/m   Physical Exam  Constitutional: He is oriented to person, place, and time. He appears well-developed and well-nourished. No distress.  Appears much improved compared to prior visit on 03/06/16.    HENT:  Head: Normocephalic and atraumatic.  Nose: No mucosal edema or rhinorrhea. Right sinus exhibits no maxillary sinus tenderness and no frontal sinus tenderness. Left sinus exhibits no maxillary sinus tenderness and no frontal sinus tenderness.  Mouth/Throat: Uvula is midline, oropharynx is clear and moist and mucous membranes are normal.  Eyes: Conjunctivae are normal.  Neck: Normal range of motion.  Cardiovascular: Normal rate, regular rhythm and normal heart sounds.   Pulmonary/Chest: Effort normal and breath sounds normal.  Neurological: He is alert and oriented to person, place, and time.  Skin: Skin is warm and dry.  Psychiatric: He has a normal mood and affect.  Vitals reviewed.   Results for orders placed or performed in visit on 03/09/16 (from the past 24 hour(s))  POCT CBC     Status: Abnormal   Collection Time: 03/09/16   9:34 AM  Result Value Ref Range   WBC 9.3 4.6 - 10.2 K/uL   Lymph, poc 1.9 0.6 - 3.4   POC LYMPH PERCENT 19.0 10 - 50 %L  MID (cbc) 0.6 0 - 0.9   POC MID % 6.2 0 - 12 %M   POC Granulocyte 6.9 2 - 6.9   Granulocyte percent 73.9 37 - 80 %G   RBC 4.22 (A) 4.69 - 6.13 M/uL   Hemoglobin 11.4 (A) 14.1 - 18.1 g/dL   HCT, POC 33.5 (A) 43.5 - 53.7 %   MCV 79.2 (A) 80 - 97 fL   MCH, POC 27.1 27 - 31.2 pg   MCHC 34.2 31.8 - 35.4 g/dL   RDW, POC 16.1 %   Platelet Count, POC 168 142 - 424 K/uL   MPV 8.5 0 - 99.8 fL    Assessment and Plan :   1. Leukocytosis, unspecified type -Improved - POCT CBC  2. Maxillary sinusitis, unspecified chronicity -Improved, finish entire course of antibiotics.   3. Anemia, unspecified type -Informed of low hemoglobin at 11.4. This appears to chronic over the past 8 months. Instructed him to contact his PCP and inquire about this.   -Return to clinic if symptoms worsen, do not improve, or as needed  Tenna Delaine, PA-C [facility] [phone] 03/09/2016 9:40 AM

## 2016-03-13 ENCOUNTER — Other Ambulatory Visit: Payer: Self-pay | Admitting: Internal Medicine

## 2016-03-13 NOTE — Telephone Encounter (Signed)
Sent to the pharmacy by e-scribe. 

## 2016-03-14 DIAGNOSIS — H35033 Hypertensive retinopathy, bilateral: Secondary | ICD-10-CM | POA: Diagnosis not present

## 2016-03-14 DIAGNOSIS — H26491 Other secondary cataract, right eye: Secondary | ICD-10-CM | POA: Diagnosis not present

## 2016-03-14 DIAGNOSIS — H353110 Nonexudative age-related macular degeneration, right eye, stage unspecified: Secondary | ICD-10-CM | POA: Diagnosis not present

## 2016-03-14 DIAGNOSIS — H353120 Nonexudative age-related macular degeneration, left eye, stage unspecified: Secondary | ICD-10-CM | POA: Diagnosis not present

## 2016-03-14 DIAGNOSIS — H43822 Vitreomacular adhesion, left eye: Secondary | ICD-10-CM | POA: Diagnosis not present

## 2016-03-14 DIAGNOSIS — Z961 Presence of intraocular lens: Secondary | ICD-10-CM | POA: Diagnosis not present

## 2016-03-14 DIAGNOSIS — E119 Type 2 diabetes mellitus without complications: Secondary | ICD-10-CM | POA: Diagnosis not present

## 2016-03-14 LAB — HM DIABETES EYE EXAM

## 2016-03-15 ENCOUNTER — Encounter: Payer: Self-pay | Admitting: Family Medicine

## 2016-03-24 IMAGING — DX DG CHEST 2V
2 series · 2 of 2 positions shown · non-contrast
Comparison: 03/25/2014

CLINICAL DATA: Shortness of breath for 2 weeks

EXAM:
CHEST  2 VIEW

[chest pa]
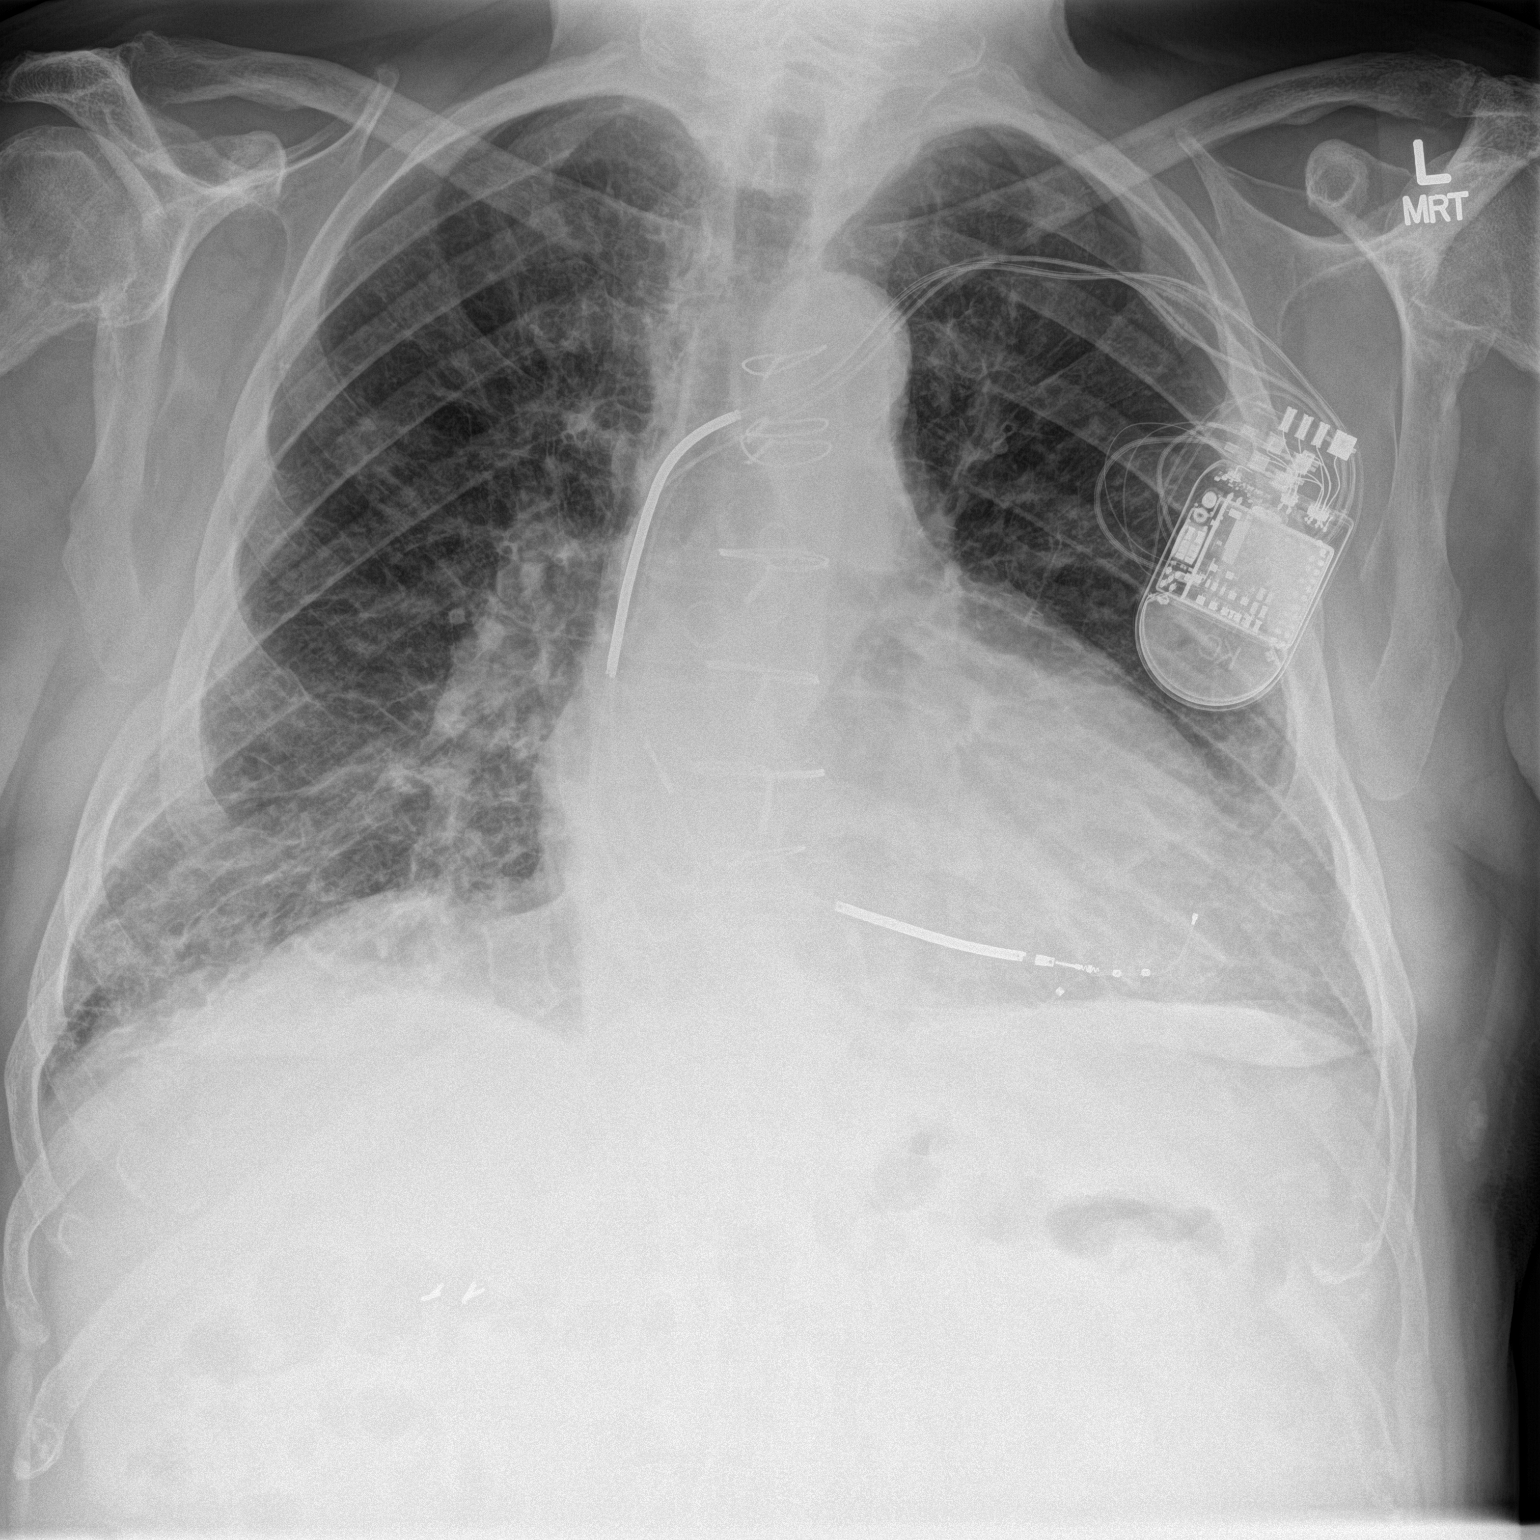

[chest lat]
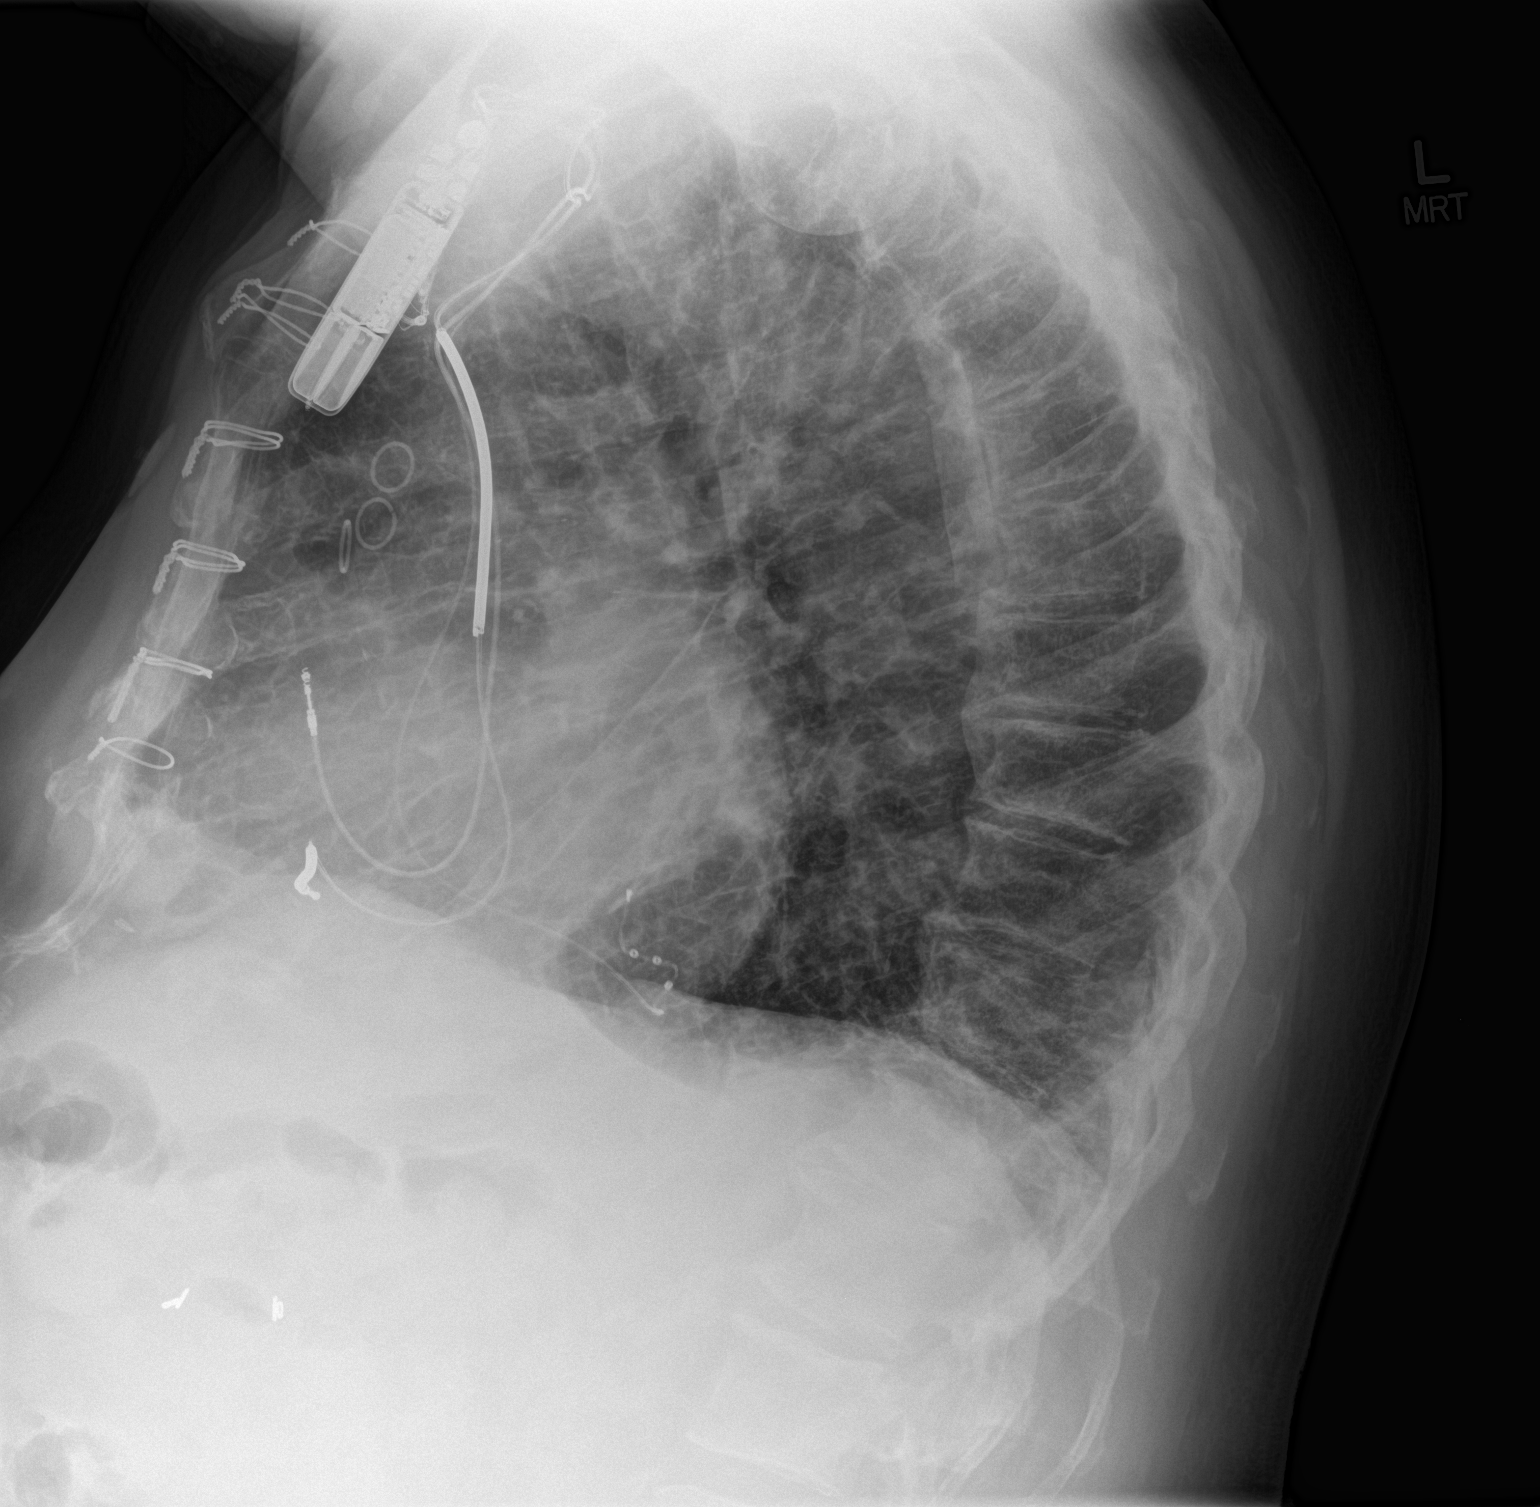

[2 of 2 positions shown; findings below may reference images not displayed]

FINDINGS: Stable cardiomegaly and aortic tortuosity post CABG. Stable
positioning of biventricular ICD/ pacer leads from the left. There
is new interstitial opacity with Kerley lines. No pleural effusion
or pneumonia. No pneumothorax.
IMPRESSION: Mild CHF.

## 2016-04-02 ENCOUNTER — Encounter (HOSPITAL_COMMUNITY): Payer: Self-pay

## 2016-04-02 ENCOUNTER — Ambulatory Visit (HOSPITAL_COMMUNITY)
Admission: RE | Admit: 2016-04-02 | Discharge: 2016-04-02 | Disposition: A | Discharge status: Home / Self Care | Payer: Medicare Other | Source: Ambulatory Visit | Attending: Cardiology | Admitting: Cardiology

## 2016-04-02 VITALS — BP 118/84 | HR 60 | Wt 198.0 lb

## 2016-04-02 DIAGNOSIS — I4891 Unspecified atrial fibrillation: Secondary | ICD-10-CM | POA: Diagnosis not present

## 2016-04-02 DIAGNOSIS — Z794 Long term (current) use of insulin: Secondary | ICD-10-CM | POA: Insufficient documentation

## 2016-04-02 DIAGNOSIS — Z79899 Other long term (current) drug therapy: Secondary | ICD-10-CM | POA: Insufficient documentation

## 2016-04-02 DIAGNOSIS — N183 Chronic kidney disease, stage 3 unspecified: Secondary | ICD-10-CM

## 2016-04-02 DIAGNOSIS — Z87891 Personal history of nicotine dependence: Secondary | ICD-10-CM | POA: Insufficient documentation

## 2016-04-02 DIAGNOSIS — I2510 Atherosclerotic heart disease of native coronary artery without angina pectoris: Secondary | ICD-10-CM | POA: Diagnosis not present

## 2016-04-02 DIAGNOSIS — I255 Ischemic cardiomyopathy: Secondary | ICD-10-CM | POA: Insufficient documentation

## 2016-04-02 DIAGNOSIS — I6521 Occlusion and stenosis of right carotid artery: Secondary | ICD-10-CM | POA: Diagnosis not present

## 2016-04-02 DIAGNOSIS — I480 Paroxysmal atrial fibrillation: Secondary | ICD-10-CM | POA: Diagnosis not present

## 2016-04-02 DIAGNOSIS — Z7901 Long term (current) use of anticoagulants: Secondary | ICD-10-CM | POA: Diagnosis not present

## 2016-04-02 DIAGNOSIS — E1122 Type 2 diabetes mellitus with diabetic chronic kidney disease: Secondary | ICD-10-CM | POA: Diagnosis not present

## 2016-04-02 DIAGNOSIS — I5022 Chronic systolic (congestive) heart failure: Secondary | ICD-10-CM | POA: Insufficient documentation

## 2016-04-02 DIAGNOSIS — I130 Hypertensive heart and chronic kidney disease with heart failure and stage 1 through stage 4 chronic kidney disease, or unspecified chronic kidney disease: Secondary | ICD-10-CM | POA: Insufficient documentation

## 2016-04-02 LAB — COMPREHENSIVE METABOLIC PANEL
ALT: 21 U/L (ref 17–63)
AST: 23 U/L (ref 15–41)
Albumin: 3.2 g/dL — ABNORMAL LOW (ref 3.5–5.0)
Alkaline Phosphatase: 60 U/L (ref 38–126)
Anion gap: 9 (ref 5–15)
BILIRUBIN TOTAL: 0.8 mg/dL (ref 0.3–1.2)
BUN: 37 mg/dL — AB (ref 6–20)
CO2: 26 mmol/L (ref 22–32)
CREATININE: 1.92 mg/dL — AB (ref 0.61–1.24)
Calcium: 9.4 mg/dL (ref 8.9–10.3)
Chloride: 101 mmol/L (ref 101–111)
GFR, EST AFRICAN AMERICAN: 35 mL/min — AB (ref >60)
GFR, EST NON AFRICAN AMERICAN: 30 mL/min — AB (ref >60)
Glucose, Bld: 340 mg/dL — ABNORMAL HIGH (ref 65–99)
Potassium: 4.8 mmol/L (ref 3.5–5.1)
Sodium: 136 mmol/L (ref 135–145)
Total Protein: 6.1 g/dL — ABNORMAL LOW (ref 6.5–8.1)

## 2016-04-02 LAB — DIGOXIN LEVEL: DIGOXIN LVL: 1 ng/mL (ref 0.8–2.0)

## 2016-04-02 LAB — TSH: TSH: 2.014 u[IU]/mL (ref 0.350–4.500)

## 2016-04-02 NOTE — Progress Notes (Signed)
Patient ID: [name], male   DOB: 1928-12-18, 83 y.o.   MRN: [id number] PCP: Dr. Regis Bill Cardiology: Dr. [name]  83 yo with history of CAD s/p CABG, ischemic cardiomyopathy, and paroxysmal atrial fibrillation presents for followup.  Patient was admitted in 2/16 with CHF and diuresed, he was sent home on Lasix 60 mg bid.  He missed 3 days of the pm Lasix dose and was re-admitted on 05/18/14 with acute on chronic systolic CHF with dyspnea and hypoxemia. He was diuresed with Lasix gtt and metolazone.  While in the hospital, he went into atrial fibrillation with RVR requiring cardioversion.  Creatinine was elevated and ramipril was stopped.  We had to cut back on Coreg due to hypotension and low output.  He was begun on low dose digoxin.    He returns for followup today.  Weight is up 2 lbs.  No chest pain.  No dyspnea on flat ground.  Short of breath carrying wood into the house.  Some dyspnea with stairs. No chest pain.  No orthopnea/PND.  No lightheadedness/falls. No BRBPR or melena. He has chronic leg weakness, ABIs were normal in 7/17.  No palpitations.   Corevue: >99% BiV pacing, impedance higher. No atrial fibrillation or VT.   ECG: a-paced, BiV paced  Labs (3/16): K 4.2, creatinine 2.26, digoxin < 0.2 Labs (05/31/2014): TSH 2.3 Dig level 0.6  Labs (06/14/2014): K 4.5 Creatinine 2.14, LDL 28, HDL 46 Labs (6/16): K 4.5, creatinine 2.08, BNP 712 Labs (7/16): K 4.1, creatinine 2.18, HCT 36.8, LFTs normal, digoxin 0.9 Labs (10/16): LDL 26, HDL 38, TSH normal, LFTs normal Labs (12/16): K 4.6, creatinine 2 Labs (2/17): LFTs normal, digoxin 1.1 Labs (4/17): K 4.7, creatinine 1.97, BUN 50, hgb 12.8, TSH normal, digoxin 0.8, LDL 29 Labs (7/17): digoxin 0.6 Labs (11/17): K 4.4, creatinine 2.13 Labs (12/17): hgb 11.4  PMH: 1. CAD: S/p CABG 2005 with LIMA-Diagonal, SVG-LAD, SVG-LCx, SVG-RCA.  Cardiolite in 2/15 with EF 19%, no ischemia.  2. Ischemic cardiomyopathy: Echo (2/16) with EF 20-25%,  severe LV dilation with RWMAs, moderate AI, moderate MR, moderately decreased RV systolic function.  St Jude CRT-D device.  3. Atrial fibrillation: Paroxysmal.  DCCV 3/16.  4. CKD 5. Carotid stenosis: Carotid dopplers (1/16) with 60-79% RICA stenosis.  Carotids (1/17) with bilateral 40-59% ICA stenosis.  6. HTN 7. Hyperlipidemia 8. Restless leg syndrome. 9. Type 2 diabetes. 10. OA 11. Depression 12. H/o CCY 61. H/o appy 14. OSA: Uses CPAP.  15. ABIs (7/17): Normal 16. Melanoma: s/p excision.   SH: Married, quit smoking 1980, [place] [employer] vet  FH: CAD  ROS: All systems reviewed and negative except as per HPI.   Current Outpatient Prescriptions  Medication Sig Dispense Refill  . acetaminophen (TYLENOL) 500 MG tablet Take 500 mg by mouth daily as needed for mild pain.    [name] amiodarone (PACERONE) 200 MG tablet Take 1 tablet (200 mg total) by mouth daily. 90 tablet 1  . apixaban (ELIQUIS) 2.5 MG TABS tablet Take 1 tablet (2.5 mg total) by mouth 2 (two) times daily. 180 tablet 1  . atorvastatin (LIPITOR) 40 MG tablet Take 1 tablet (40 mg total) by mouth daily. 90 tablet 1  . carvedilol (COREG) 6.25 MG tablet Take 1.5 tablets (9.375 mg total) by mouth 2 (two) times daily with a meal. 90 tablet 3  . DIGOX 125 MCG tablet TAKE 1/2 TABLET(0.063 MG) BY MOUTH DAILY 15 tablet 3  . escitalopram (LEXAPRO) 10 MG tablet Take 1 tablet (  10 mg total) by mouth daily. (Patient taking differently: Take 10 mg by mouth at bedtime. ) 90 tablet 3  . gabapentin (NEURONTIN) 100 MG capsule Take 1 capsule (100 mg total) by mouth 3 (three) times daily. Can increase as directed (Patient taking differently: Take 100 mg by mouth daily as needed (pain and sleep). Can increase as directed) 90 capsule 3  . glipiZIDE (GLUCOTROL) 10 MG tablet Take 1 tablet (10 mg total) by mouth daily. 180 tablet 1  . glucose blood (ONE TOUCH TEST STRIPS) test strip Use to test blood sugar 2-3 times daily 300 each 1  . hydrALAZINE  (APRESOLINE) 25 MG tablet Take 1 tablet (25 mg total) by mouth every 8 (eight) hours. 270 tablet 3  . Insulin Detemir (LEVEMIR) 100 UNIT/ML Pen Inject 18 Units into the skin at bedtime. as directed (Patient taking differently: Inject 24 Units into the skin at bedtime. ) 2 pen 0  . Insulin Pen Needle (BD PEN NEEDLE NANO U/F) 32G X 4 MM MISC Use to test blood glucose three times daily 300 each 3  . isosorbide mononitrate (IMDUR) 30 MG 24 hr tablet Take 1 tablet (30 mg total) by mouth daily. 90 tablet 3  . Menthol, Topical Analgesic, (ICY HOT EX) Apply 1 application topically at bedtime as needed (leg pain).    . Multiple Vitamin (MULTIVITAMIN WITH MINERALS) TABS tablet Take 1 tablet by mouth daily.    Glory Rosebush DELICA LANCETS FINE MISC Use to test blood sugar 2-3 times daily 300 each 1  . polyethylene glycol (MIRALAX) packet Take 17 g by mouth daily. (Patient taking differently: Take 17 g by mouth daily as needed for mild constipation. ) 14 each 0  . potassium chloride SA (K-DUR,KLOR-CON) 20 MEQ tablet Take 2 tablets (40 mEq total) by mouth daily. 180 tablet 3  . pramipexole (MIRAPEX) 0.5 MG tablet TAKE 2-3 HOURS BEFORE SLEEPFOR RESTLESS LEG (Patient taking differently: TAKE 0.5mg  2-3 HOURS BEFORE SLEEPFOR RESTLESS LEG) 90 tablet 1  . saxagliptin HCl (ONGLYZA) 5 MG TABS tablet Take 1 tablet (5 mg total) by mouth daily. 90 tablet 3  . silodosin (RAPAFLO) 8 MG CAPS capsule Take 1 capsule (8 mg total) by mouth daily with breakfast. 90 capsule 1  . spironolactone (ALDACTONE) 25 MG tablet Take 1 tablet (25 mg total) by mouth daily. (Patient taking differently: Take 25 mg by mouth daily at 12 noon. ) 90 tablet 3  . torsemide (DEMADEX) 20 MG tablet Take 3 tablets (60 mg total) by mouth daily. Take extra as directed for wt gain 270 tablet 3   No current facility-administered medications for this encounter.    BP 118/84 (BP Location: Left Arm, Patient Position: Sitting, Cuff Size: Normal)   Pulse 60   Wt  198 lb (89.8 kg)   SpO2 100%   BMI 25.42 kg/m  General: NAD.  Neck: JVP 7 cm, no thyromegaly or thyroid nodule.  Lungs: Clear to auscultation bilaterally with normal respiratory effort. CV: Nondisplaced PMI.  Heart regular S1/S2, no S3/S4, 2/6 SEM RUSB with clear S2.  No edema. Left carotid bruit.  Unable to palpate pedal pulses.  Abdomen: Soft, nontender, no hepatosplenomegaly,no abdominal distention.  Skin: Intact without lesions or rashes.  Neurologic: Alert and oriented x 3.  Psych: Normal affect. Extremities: No clubbing or cyanosis.  HEENT: Normal.   Assessment/Plan: 1. Chronic systolic CHF: Ischemic cardiomyopathy.  EF 20-25% by 2/16 echo with moderate RV dysfunction.  Stable NYHA class II-III symptoms. St Jude CRT-D  device.  He is not volume overloaded on exam or by Corevue. - Continue torsemide 60 mg daily.  Check BMET.     - Continue current hydralazine/Imdur  - Will leave off ACEI for now with elevated creatinine.  - Continue current spironolactone.  - Continue current digoxin.  Check level today.   - Continue Coreg to 9.375 mg bid, will not increase give issues with imbalance. - Repeat echo at followup in 4 months.  2. Atrial fibrillation: Paroxysmal.  No recent atrial fibrillation by device interrogation today, he is in NSR today.  Continue amiodarone and Eliquis (dosed at 2.5 mg bid with age and renal dysfunction).  Will need to make sure to have regular eye exam with amiodarone.  Check LFTs and TSH today.  3. CKD: Stage 3.  Will remain off ACEI for now.   4. CAD: No chest pain, stable.  Continue on statin.  He is not on ASA 81 given stable CAD and Eliquis use.   5. Carotid stenosis: Repeat carotid dopplers 1/18 (scheduled).   Follow up in 4/18 with echo.   [name] 04/02/2016

## 2016-04-02 NOTE — Patient Instructions (Signed)
Labs today  Your physician has requested that you have an echocardiogram. Echocardiography is a painless test that uses sound waves to create images of your heart. It provides your doctor with information about the size and shape of your heart and how well your heart's chambers and valves are working. This procedure takes approximately one hour. There are no restrictions for this procedure.  IN 4 MONTHS  We will contact you in 4 months to schedule your next appointment.

## 2016-04-03 ENCOUNTER — Telehealth (HOSPITAL_COMMUNITY): Payer: Self-pay | Admitting: *Deleted

## 2016-04-03 DIAGNOSIS — I482 Chronic atrial fibrillation, unspecified: Secondary | ICD-10-CM

## 2016-04-03 NOTE — Telephone Encounter (Signed)
Notes Recorded by Kennieth Rad, RN on 04/03/2016 at 8:27 AM EST Called and spoke with his wife, she will bring him back in for a dig level on January 29th. I've added him to lab schedule and placed order in epic. ------  Notes Recorded by Larey Dresser, MD on 04/02/2016 at 10:13 PM EST Labs ok But would like to see digoxin level repeated as trough one morning in the next couple weeks.

## 2016-04-10 ENCOUNTER — Ambulatory Visit (INDEPENDENT_AMBULATORY_CARE_PROVIDER_SITE_OTHER): Payer: Medicare Other | Admitting: *Deleted

## 2016-04-10 ENCOUNTER — Other Ambulatory Visit (HOSPITAL_COMMUNITY): Payer: Self-pay | Admitting: Cardiology

## 2016-04-10 DIAGNOSIS — I5022 Chronic systolic (congestive) heart failure: Secondary | ICD-10-CM

## 2016-04-10 DIAGNOSIS — I255 Ischemic cardiomyopathy: Secondary | ICD-10-CM

## 2016-04-10 LAB — CUP PACEART REMOTE DEVICE CHECK
Battery Remaining Longevity: 28 mo
Battery Remaining Percentage: 43 %
Brady Statistic AP VP Percent: 99 %
Brady Statistic AS VP Percent: 1.1 %
Brady Statistic AS VS Percent: 1 %
Brady Statistic RA Percent Paced: 99 %
Date Time Interrogation Session: 20180123104114
HIGH POWER IMPEDANCE MEASURED VALUE: 42 Ohm
HighPow Impedance: 42 Ohm
Implantable Lead Implant Date: 20051221
Implantable Lead Implant Date: 20150304
Implantable Lead Location: 753858
Implantable Lead Location: 753859
Lead Channel Impedance Value: 350 Ohm
Lead Channel Pacing Threshold Amplitude: 1.25 V
Lead Channel Pacing Threshold Amplitude: 1.625 V
Lead Channel Pacing Threshold Pulse Width: 0.5 ms
Lead Channel Pacing Threshold Pulse Width: 1 ms
Lead Channel Setting Pacing Amplitude: 2 V
Lead Channel Setting Pacing Amplitude: 2.5 V
Lead Channel Setting Sensing Sensitivity: 0.5 mV
MDC IDC LEAD IMPLANT DT: 20150304
MDC IDC LEAD LOCATION: 753860
MDC IDC MSMT BATTERY VOLTAGE: 2.89 V
MDC IDC MSMT LEADCHNL LV IMPEDANCE VALUE: 410 Ohm
MDC IDC MSMT LEADCHNL RA IMPEDANCE VALUE: 360 Ohm
MDC IDC MSMT LEADCHNL RA PACING THRESHOLD AMPLITUDE: 0.75 V
MDC IDC MSMT LEADCHNL RA PACING THRESHOLD PULSEWIDTH: 0.5 ms
MDC IDC MSMT LEADCHNL RA SENSING INTR AMPL: 3.4 mV
MDC IDC MSMT LEADCHNL RV SENSING INTR AMPL: 12 mV
MDC IDC PG IMPLANT DT: 20150304
MDC IDC SET LEADCHNL LV PACING AMPLITUDE: 2.625
MDC IDC SET LEADCHNL LV PACING PULSEWIDTH: 1 ms
MDC IDC SET LEADCHNL RV PACING PULSEWIDTH: 0.5 ms
MDC IDC STAT BRADY AP VS PERCENT: 1 %
Pulse Gen Serial Number: 7170478

## 2016-04-11 ENCOUNTER — Other Ambulatory Visit: Payer: Self-pay | Admitting: Cardiology

## 2016-04-11 DIAGNOSIS — I6523 Occlusion and stenosis of bilateral carotid arteries: Secondary | ICD-10-CM

## 2016-04-11 NOTE — Progress Notes (Signed)
Remote ICD transmission.   

## 2016-04-12 ENCOUNTER — Encounter: Payer: Self-pay | Admitting: Cardiology

## 2016-04-12 ENCOUNTER — Other Ambulatory Visit: Payer: Self-pay | Admitting: Internal Medicine

## 2016-04-13 ENCOUNTER — Ambulatory Visit (HOSPITAL_COMMUNITY)
Admission: RE | Admit: 2016-04-13 | Discharge: 2016-04-13 | Disposition: A | Discharge status: Home / Self Care | Payer: Medicare Other | Source: Ambulatory Visit | Attending: Cardiology | Admitting: Cardiology

## 2016-04-13 DIAGNOSIS — I6523 Occlusion and stenosis of bilateral carotid arteries: Secondary | ICD-10-CM | POA: Insufficient documentation

## 2016-04-14 IMAGING — CR DG CHEST 1V PORT
1 series · 1 of 1 positions shown · non-contrast
Comparison: 04/27/2014

CLINICAL DATA: Shortness of breath, history of atrial fibrillation

EXAM:
PORTABLE CHEST - 1 VIEW

[AP]
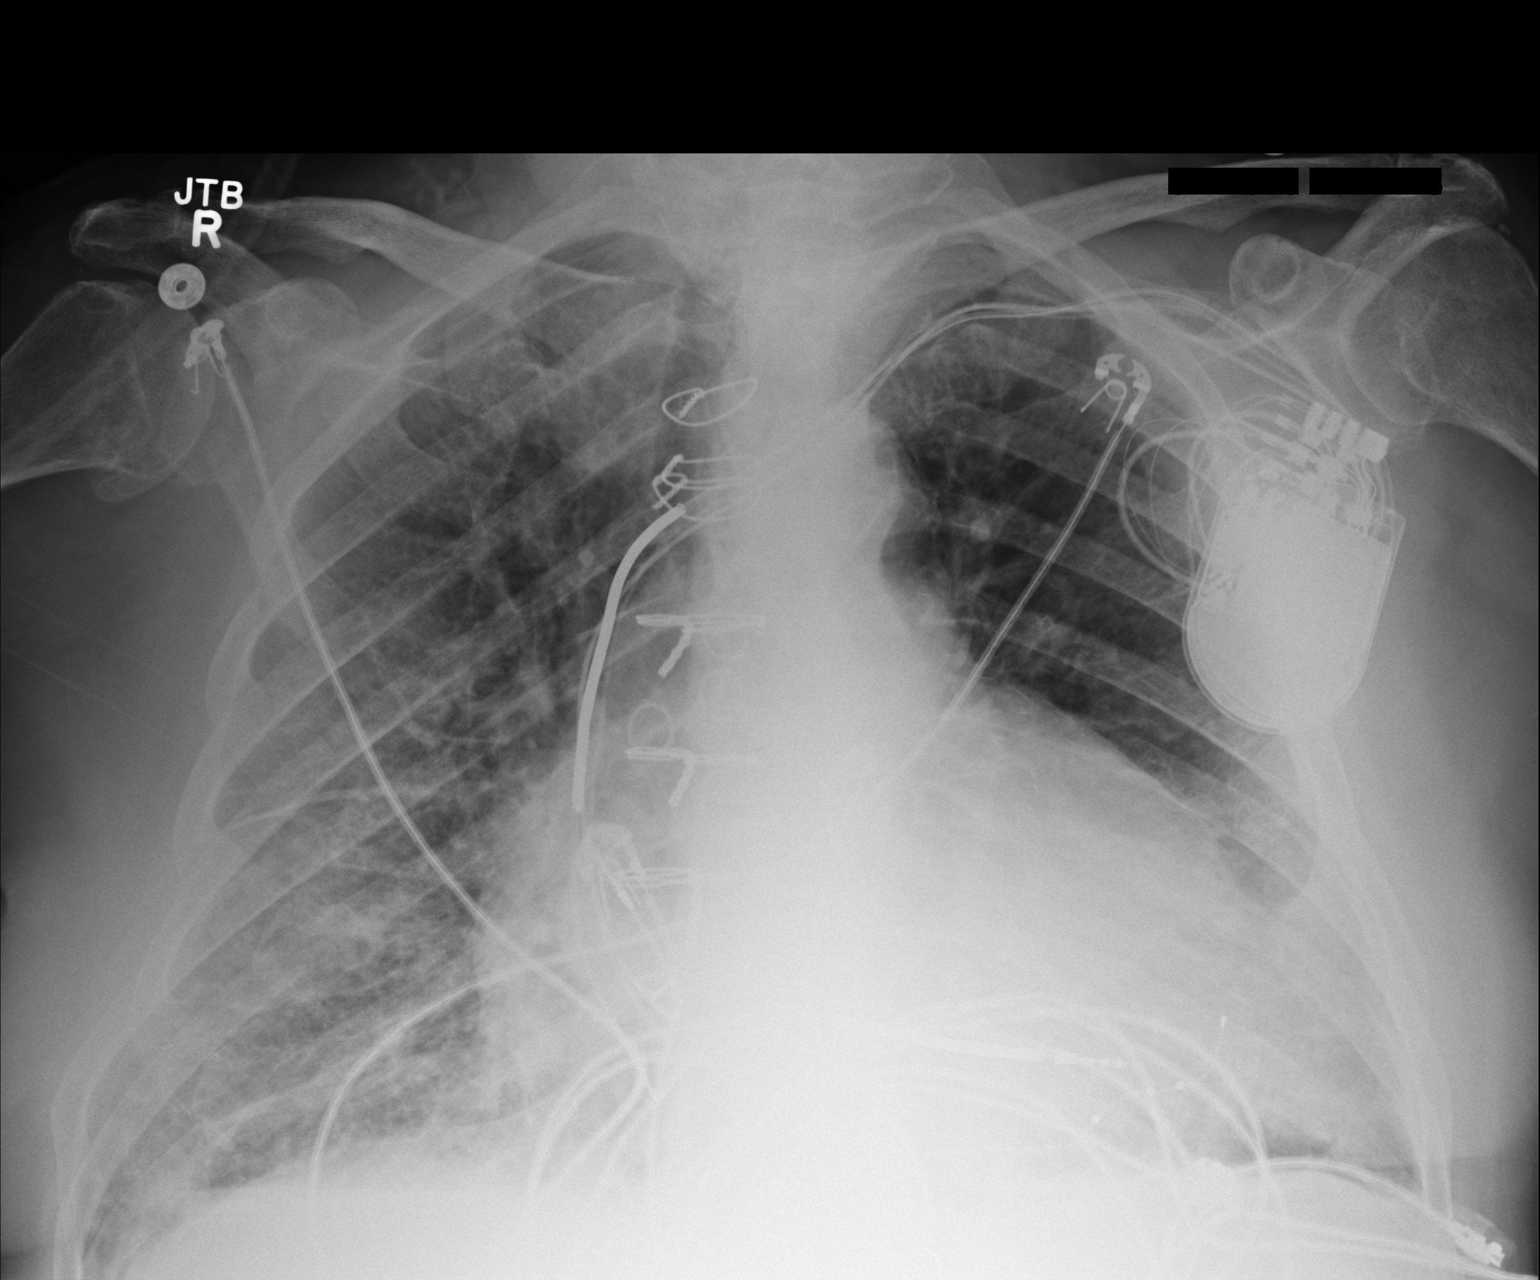

[1 of 1 positions shown; findings below may reference images not displayed]

FINDINGS: Cardiomegaly again noted. Status post CABG. Dual lead cardiac
pacemaker is unchanged in position. Central mild vascular congestion
without convincing pulmonary edema. There is streaky right basilar
atelectasis or infiltrate.
IMPRESSION: Cardiomegaly. Dual lead cardiac pacemaker in place. Status post
CABG. Central mild vascular congestion without convincing pulmonary
edema. Streaky right basilar atelectasis or early infiltrate.

## 2016-04-15 IMAGING — CR DG CHEST 1V PORT
1 series · 1 of 1 positions shown · non-contrast
Comparison: Portable exam 4248 hours compared to 05/18/2014

CLINICAL DATA: RIGHT side PICC line placement, history
hypertension, CHF, diabetes, ischemic cardiomyopathy, MI, atrial
fibrillation

EXAM:
PORTABLE CHEST - 1 VIEW

[AP]
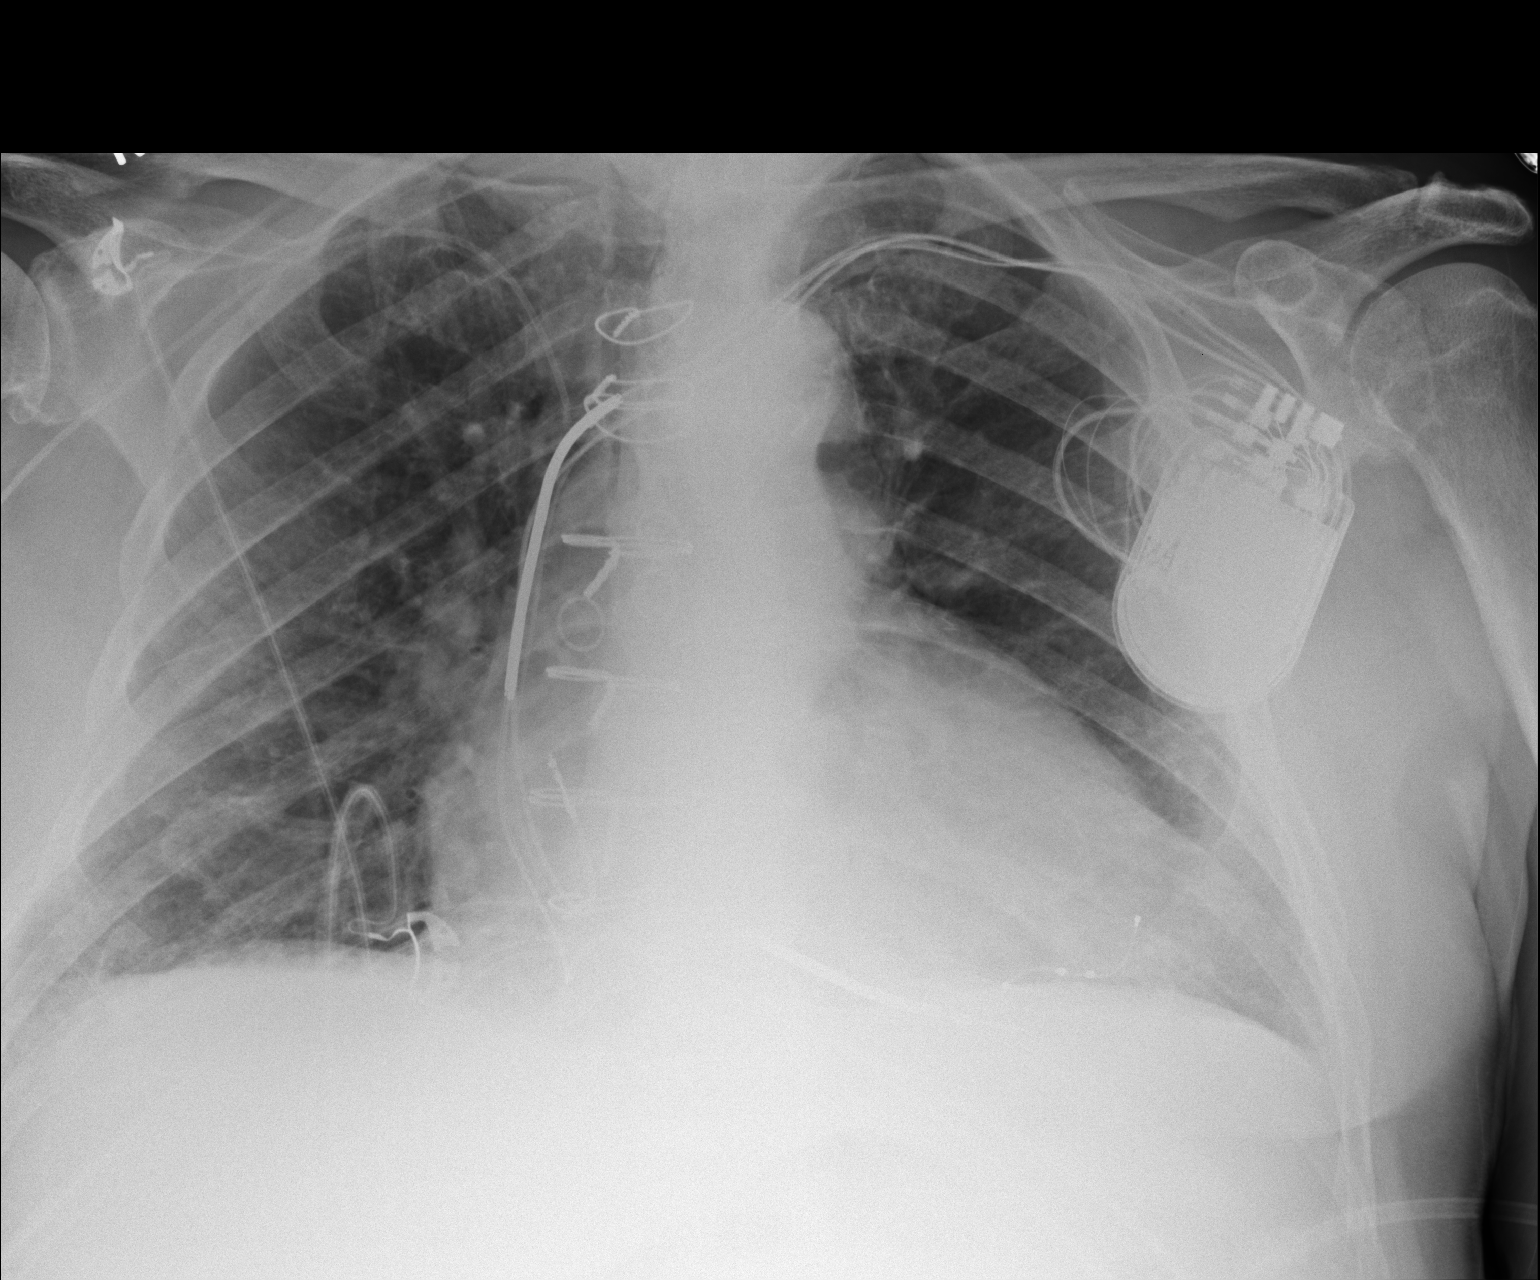

[1 of 1 positions shown; findings below may reference images not displayed]

FINDINGS: RIGHT arm PICC line tip projects over proximal SVC.

LEFT subclavian transvenous pacemaker/AICD leads project over RIGHT
atrium, RIGHT ventricle and coronary sinus.

Enlargement of cardiac silhouette post CABG.

Slight pulmonary vascular congestion.

Calcified tortuous aorta.

Minimal RIGHT basilar atelectasis.

Lungs otherwise clear.

No pleural effusion or pneumothorax.
IMPRESSION: Enlargement of cardiac silhouette post CABG and AICD.

Tip of RIGHT arm PICC line projects over proximal SVC.

## 2016-04-16 ENCOUNTER — Ambulatory Visit (HOSPITAL_COMMUNITY)
Admission: RE | Admit: 2016-04-16 | Discharge: 2016-04-16 | Disposition: A | Discharge status: Home / Self Care | Payer: Medicare Other | Source: Ambulatory Visit | Attending: Internal Medicine | Admitting: Internal Medicine

## 2016-04-16 DIAGNOSIS — I482 Chronic atrial fibrillation, unspecified: Secondary | ICD-10-CM

## 2016-04-16 LAB — DIGOXIN LEVEL: Digoxin Level: 0.9 ng/mL (ref 0.8–2.0)
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
## 2016-04-17 ENCOUNTER — Ambulatory Visit (INDEPENDENT_AMBULATORY_CARE_PROVIDER_SITE_OTHER)
Admission: RE | Admit: 2016-04-17 | Discharge: 2016-04-17 | Disposition: A | Discharge status: Home / Self Care | Payer: Medicare Other | Source: Ambulatory Visit | Attending: Internal Medicine | Admitting: Internal Medicine

## 2016-04-17 ENCOUNTER — Encounter: Payer: Self-pay | Admitting: Internal Medicine

## 2016-04-17 ENCOUNTER — Other Ambulatory Visit: Payer: Self-pay | Admitting: Internal Medicine

## 2016-04-17 ENCOUNTER — Ambulatory Visit (INDEPENDENT_AMBULATORY_CARE_PROVIDER_SITE_OTHER): Payer: Medicare Other | Admitting: Internal Medicine

## 2016-04-17 VITALS — BP 112/64 | HR 60 | Temp 97.5°F | Wt 199.6 lb

## 2016-04-17 DIAGNOSIS — S79911A Unspecified injury of right hip, initial encounter: Secondary | ICD-10-CM | POA: Diagnosis not present

## 2016-04-17 DIAGNOSIS — I255 Ischemic cardiomyopathy: Secondary | ICD-10-CM | POA: Diagnosis not present

## 2016-04-17 DIAGNOSIS — Z9181 History of falling: Secondary | ICD-10-CM

## 2016-04-17 DIAGNOSIS — M25551 Pain in right hip: Secondary | ICD-10-CM | POA: Diagnosis not present

## 2016-04-17 DIAGNOSIS — M545 Low back pain: Secondary | ICD-10-CM | POA: Diagnosis not present

## 2016-04-17 DIAGNOSIS — Z7901 Long term (current) use of anticoagulants: Secondary | ICD-10-CM

## 2016-04-17 DIAGNOSIS — E1140 Type 2 diabetes mellitus with diabetic neuropathy, unspecified: Secondary | ICD-10-CM

## 2016-04-17 DIAGNOSIS — M5441 Lumbago with sciatica, right side: Secondary | ICD-10-CM | POA: Diagnosis not present

## 2016-04-17 DIAGNOSIS — I5022 Chronic systolic (congestive) heart failure: Secondary | ICD-10-CM

## 2016-04-17 DIAGNOSIS — S3992XA Unspecified injury of lower back, initial encounter: Secondary | ICD-10-CM | POA: Diagnosis not present

## 2016-04-17 DIAGNOSIS — Z794 Long term (current) use of insulin: Secondary | ICD-10-CM

## 2016-04-17 MED ORDER — INSULIN DETEMIR 100 UNIT/ML FLEXPEN: 28.0000 [IU] | PEN_INJECTOR | Freq: Every day | SUBCUTANEOUS | 11 refills | Status: DC

## 2016-04-17 NOTE — Progress Notes (Signed)
Pre visit review using our clinic review tool, if applicable. No additional management support is needed unless otherwise documented below in the visit note. 

## 2016-04-17 NOTE — Patient Instructions (Addendum)
Get x-ray of your hip and back pelvis today. Depending on results we will probably  you to an orthopedist.  If not getting better  Increase your insulin units to 28 units a day to get your fasting blood sugar below 180 preferably below 150.[name]  Send in blood sugar readings after 1 month and plan follow-up.

## 2016-04-18 ENCOUNTER — Other Ambulatory Visit: Payer: Self-pay | Admitting: Family Medicine

## 2016-04-18 DIAGNOSIS — M549 Dorsalgia, unspecified: Secondary | ICD-10-CM

## 2016-04-18 DIAGNOSIS — R102 Pelvic and perineal pain: Secondary | ICD-10-CM

## 2016-04-18 DIAGNOSIS — M25559 Pain in unspecified hip: Secondary | ICD-10-CM

## 2016-04-18 DIAGNOSIS — W19XXXA Unspecified fall, initial encounter: Secondary | ICD-10-CM

## 2016-04-30 DIAGNOSIS — M48062 Spinal stenosis, lumbar region with neurogenic claudication: Secondary | ICD-10-CM | POA: Diagnosis not present

## 2016-05-08 ENCOUNTER — Telehealth: Payer: Self-pay | Admitting: Pharmacist

## 2016-05-08 NOTE — Telephone Encounter (Signed)
Received fax from [facility] for clearance to hold Eliquis prior to CT myelogram to be scheduled pending clearance.   Pt with Afib with CHADS of 4 and no history of stroke. Per protocol ok to hold 72 hours prior to procedure.   Clearance faxed back to [phone].

## 2016-05-09 ENCOUNTER — Other Ambulatory Visit: Payer: Self-pay | Admitting: Orthopedic Surgery

## 2016-05-09 DIAGNOSIS — L570 Actinic keratosis: Secondary | ICD-10-CM | POA: Diagnosis not present

## 2016-05-09 DIAGNOSIS — M48062 Spinal stenosis, lumbar region with neurogenic claudication: Secondary | ICD-10-CM

## 2016-05-09 DIAGNOSIS — L905 Scar conditions and fibrosis of skin: Secondary | ICD-10-CM | POA: Diagnosis not present

## 2016-05-21 DIAGNOSIS — M48062 Spinal stenosis, lumbar region with neurogenic claudication: Secondary | ICD-10-CM | POA: Diagnosis not present

## 2016-05-24 ENCOUNTER — Ambulatory Visit (INDEPENDENT_AMBULATORY_CARE_PROVIDER_SITE_OTHER): Payer: Medicare Other | Admitting: Podiatry

## 2016-05-24 ENCOUNTER — Encounter: Payer: Self-pay | Admitting: Podiatry

## 2016-05-24 DIAGNOSIS — E1140 Type 2 diabetes mellitus with diabetic neuropathy, unspecified: Secondary | ICD-10-CM | POA: Diagnosis not present

## 2016-05-24 DIAGNOSIS — M79676 Pain in unspecified toe(s): Principal | ICD-10-CM

## 2016-05-24 DIAGNOSIS — M79675 Pain in left toe(s): Secondary | ICD-10-CM

## 2016-05-24 DIAGNOSIS — B351 Tinea unguium: Secondary | ICD-10-CM

## 2016-05-24 DIAGNOSIS — Q828 Other specified congenital malformations of skin: Secondary | ICD-10-CM

## 2016-05-24 NOTE — Progress Notes (Signed)
Patient ID: [name], male   DOB: [birth year], 83 y.o.   MRN: [id number] Complaint:  Visit Type: Patient returns to my office for continued preventative foot care services. Complaint: Patient states" my nails have grown long and thick and become painful to walk and wear shoes" Patient has been diagnosed with DM with neuropathy.[name] He presents for preventative foot care services. No changes to ROS.  He also has painful callus under the outside ball of both feet.  Podiatric Exam: Vascular: dorsalis pedis and posterior tibial pulses are palpable bilateral. Capillary return is immediate. Temperature gradient is WNL. Skin turgor WNL  Sensorium: Diminished  Semmes Weinstein monofilament test. Normal tactile sensation bilaterally. Nail Exam: Pt has thick disfigured discolored nails with subungual debris noted bilateral entire nail hallux through fifth toenails Ulcer Exam: There is no evidence of ulcer or pre-ulcerative changes or infection. Orthopedic Exam: Muscle tone and strength are WNL. No limitations in general ROM. No crepitus or effusions noted. Foot type and digits show no abnormalities. Bony prominences are unremarkable. Asymptomatic HAV B/L. Skin:  Porokeratosis sub 5th metatarsal bilateral.. No infection or ulcers.  Asymptomatic Callus both feet.  Diagnosis:  Tinea unguium, Pain in right toe, pain in left toes, Porokeratosis  Treatment & Plan Procedures and Treatment: Consent by patient was obtained for treatment procedures. The patient understood the discussion of treatment and procedures well. All questions were answered thoroughly reviewed. Debridement of mycotic and hypertrophic toenails, 1 through 5 bilateral and clearing of subungual debris. No ulceration, no infection noted.  Debride porokeratosis Return Visit-Office Procedure: Patient instructed to return to the office for a follow up visit 3 months for continued evaluation and treatment.   Gardiner Barefoot DPM

## 2016-06-19 ENCOUNTER — Telehealth: Payer: Self-pay | Admitting: Internal Medicine

## 2016-06-19 DIAGNOSIS — I509 Heart failure, unspecified: Secondary | ICD-10-CM

## 2016-06-19 MED ORDER — PRAMIPEXOLE DIHYDROCHLORIDE 0.5 MG PO TABS: ORAL_TABLET | ORAL | 1 refills | Status: DC

## 2016-06-19 MED ORDER — ESCITALOPRAM OXALATE 10 MG PO TABS: 10.0000 mg | ORAL_TABLET | Freq: Every day | ORAL | 0 refills | Status: DC

## 2016-06-19 MED ORDER — SPIRONOLACTONE 25 MG PO TABS: 25.0000 mg | ORAL_TABLET | Freq: Every day | ORAL | 0 refills | Status: DC

## 2016-06-19 MED ORDER — SAXAGLIPTIN HCL 5 MG PO TABS: 5.0000 mg | ORAL_TABLET | Freq: Every day | ORAL | 0 refills | Status: DC

## 2016-06-19 MED ORDER — GLIPIZIDE 10 MG PO TABS: 10.0000 mg | ORAL_TABLET | Freq: Every day | ORAL | 0 refills | Status: DC

## 2016-06-19 MED ORDER — SILODOSIN 8 MG PO CAPS: 8.0000 mg | ORAL_CAPSULE | Freq: Every day | ORAL | 0 refills | Status: DC

## 2016-06-19 NOTE — Telephone Encounter (Signed)
°  Pt request refill of the following:  90 day supply   escitalopram (LEXAPRO) 10 MG tablet  glipiZIDE (GLUCOTROL) 10 MG tablet  pramipexole (MIRAPEX) 0.5 MG tablet  saxagliptin HCl (ONGLYZA) 5 MG TABS tablet  silodosin (RAPAFLO) 8 MG CAPS capsule  spironolactone (ALDACTONE) 25 MG tablet   Phamacy: [facility]

## 2016-06-19 NOTE — Telephone Encounter (Signed)
Refill sent in

## 2016-07-27 ENCOUNTER — Other Ambulatory Visit: Payer: Self-pay | Admitting: Cardiology

## 2016-07-27 DIAGNOSIS — I2510 Atherosclerotic heart disease of native coronary artery without angina pectoris: Secondary | ICD-10-CM

## 2016-07-30 ENCOUNTER — Other Ambulatory Visit (HOSPITAL_COMMUNITY): Payer: Self-pay | Admitting: Cardiology

## 2016-07-31 ENCOUNTER — Telehealth (HOSPITAL_COMMUNITY): Payer: Self-pay

## 2016-07-31 NOTE — Telephone Encounter (Signed)
Patient asking guidance on eliquis for upcoming tooth extraction. Per Dr. [name] advised to hold for 2 days, then resume per dentist instructions. Wife made aware of instructions.  Renee Pain, RN

## 2016-08-01 ENCOUNTER — Encounter: Payer: Self-pay | Admitting: Internal Medicine

## 2016-08-01 ENCOUNTER — Ambulatory Visit (INDEPENDENT_AMBULATORY_CARE_PROVIDER_SITE_OTHER): Payer: Medicare Other | Admitting: Internal Medicine

## 2016-08-01 ENCOUNTER — Telehealth (HOSPITAL_COMMUNITY): Payer: Self-pay | Admitting: Vascular Surgery

## 2016-08-01 VITALS — BP 100/58 | HR 61 | Ht 74.0 in | Wt 201.0 lb

## 2016-08-01 DIAGNOSIS — I255 Ischemic cardiomyopathy: Secondary | ICD-10-CM | POA: Diagnosis not present

## 2016-08-01 DIAGNOSIS — I5022 Chronic systolic (congestive) heart failure: Secondary | ICD-10-CM | POA: Diagnosis not present

## 2016-08-01 DIAGNOSIS — I2589 Other forms of chronic ischemic heart disease: Secondary | ICD-10-CM

## 2016-08-01 DIAGNOSIS — I482 Chronic atrial fibrillation, unspecified: Secondary | ICD-10-CM

## 2016-08-01 DIAGNOSIS — I447 Left bundle-branch block, unspecified: Secondary | ICD-10-CM

## 2016-08-01 DIAGNOSIS — Z95810 Presence of automatic (implantable) cardiac defibrillator: Secondary | ICD-10-CM

## 2016-08-01 MED ORDER — CARVEDILOL 6.25 MG PO TABS: 3.1250 mg | ORAL_TABLET | Freq: Two times a day (BID) | ORAL | Status: DC

## 2016-08-01 MED ORDER — AMIODARONE HCL 200 MG PO TABS: 100.0000 mg | ORAL_TABLET | Freq: Every day | ORAL | Status: DC
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
## 2016-08-01 NOTE — Telephone Encounter (Signed)
Left pt message to make f/u app w/ [facility] in July

## 2016-08-01 NOTE — Patient Instructions (Signed)
Medication Instructions: - Your physician has recommended you make the following change in your medication:  1) Decrease amiodarone 200 mg- take 1/2 tablet (100 mg) once daily 2) Decrease carvediolol (coreg) 6.25 mg- take 1/2 tablet (3.125 mg) twice daily 3) Tomorrow only- increase torsemide to 20 mg- take 6 pills (120 mg) once , then resume your regular dosing  Labwork: - Your physician recommends that you have lab work today: CBC  Procedures/Testing: - none ordered  Follow-Up: - Your physician recommends that you schedule a follow-up appointment in: July with Dr. [name] in the [place] clinic  - Remote monitoring is used to monitor your Pacemaker of ICD from home. This monitoring reduces the number of office visits required to check your device to one time per year. It allows [place] to keep an eye on the functioning of your device to ensure it is working properly. You are scheduled for a device check from home on 10/31/16. You may send your transmission at any time that day. If you have a wireless device, the transmission will be sent automatically. After your physician reviews your transmission, you will receive a postcard with your next transmission date.  - Your physician wants you to follow-up in: 1 year with Dr. Caryl Comes. You will receive a reminder letter in the mail two months in advance. If you don't receive a letter, please call our office to schedule the follow-up appointment.   Any Additional Special Instructions Will Be Listed Below (If Applicable).     If you need a refill on your cardiac medications before your next appointment, please call your pharmacy.

## 2016-08-02 LAB — CBC WITH DIFFERENTIAL/PLATELET
BASOS ABS: 0 10*3/uL (ref 0.0–0.2)
Basos: 0 %
EOS (ABSOLUTE): 0.1 10*3/uL (ref 0.0–0.4)
Eos: 1 %
HEMOGLOBIN: 10.7 g/dL — AB (ref 13.0–17.7)
Hematocrit: 35.3 % — ABNORMAL LOW (ref 37.5–51.0)
IMMATURE GRANULOCYTES: 1 %
Immature Grans (Abs): 0.1 10*3/uL (ref 0.0–0.1)
Lymphocytes Absolute: 2.3 10*3/uL (ref 0.7–3.1)
Lymphs: 19 %
MCH: 25 pg — ABNORMAL LOW (ref 26.6–33.0)
MCHC: 30.3 g/dL — ABNORMAL LOW (ref 31.5–35.7)
MCV: 83 fL (ref 79–97)
MONOCYTES: 6 %
Monocytes Absolute: 0.7 10*3/uL (ref 0.1–0.9)
NEUTROS ABS: 8.6 10*3/uL — AB (ref 1.4–7.0)
NEUTROS PCT: 73 %
PLATELETS: 258 10*3/uL (ref 150–379)
RBC: 4.28 x10E6/uL (ref 4.14–5.80)
RDW: 15.3 % (ref 12.3–15.4)
WBC: 11.7 10*3/uL — AB (ref 3.4–10.8)

## 2016-08-03 LAB — CUP PACEART INCLINIC DEVICE CHECK
Brady Statistic RA Percent Paced: 99 %
Date Time Interrogation Session: 20180516163716
HIGH POWER IMPEDANCE MEASURED VALUE: 41.0173
Implantable Lead Implant Date: 20150304
Implantable Lead Model: 1581
Lead Channel Impedance Value: 362.5 Ohm
Lead Channel Pacing Threshold Amplitude: 0.75 V
Lead Channel Pacing Threshold Amplitude: 1 V
Lead Channel Pacing Threshold Amplitude: 2 V
Lead Channel Pacing Threshold Amplitude: 2 V
Lead Channel Pacing Threshold Pulse Width: 0.5 ms
Lead Channel Pacing Threshold Pulse Width: 0.5 ms
Lead Channel Pacing Threshold Pulse Width: 0.5 ms
Lead Channel Pacing Threshold Pulse Width: 1 ms
Lead Channel Pacing Threshold Pulse Width: 1 ms
Lead Channel Sensing Intrinsic Amplitude: 12 mV
Lead Channel Setting Pacing Amplitude: 2 V
Lead Channel Setting Pacing Amplitude: 2.5 V
Lead Channel Setting Pacing Amplitude: 2.625
Lead Channel Setting Pacing Pulse Width: 0.5 ms
Lead Channel Setting Pacing Pulse Width: 1 ms
MDC IDC LEAD IMPLANT DT: 20051221
MDC IDC LEAD IMPLANT DT: 20150304
MDC IDC LEAD LOCATION: 753858
MDC IDC LEAD LOCATION: 753859
MDC IDC LEAD LOCATION: 753860
MDC IDC MSMT LEADCHNL LV IMPEDANCE VALUE: 412.5 Ohm
MDC IDC MSMT LEADCHNL RA IMPEDANCE VALUE: 362.5 Ohm
MDC IDC MSMT LEADCHNL RA PACING THRESHOLD AMPLITUDE: 0.75 V
MDC IDC MSMT LEADCHNL RA SENSING INTR AMPL: 1.6 mV
MDC IDC MSMT LEADCHNL RV PACING THRESHOLD AMPLITUDE: 1 V
MDC IDC MSMT LEADCHNL RV PACING THRESHOLD PULSEWIDTH: 0.5 ms
MDC IDC PG IMPLANT DT: 20150304
MDC IDC SET LEADCHNL RV SENSING SENSITIVITY: 0.5 mV
MDC IDC STAT BRADY RV PERCENT PACED: 99.98 %
Pulse Gen Serial Number: 7170478

## 2016-08-15 ENCOUNTER — Ambulatory Visit (INDEPENDENT_AMBULATORY_CARE_PROVIDER_SITE_OTHER): Payer: Medicare Other | Admitting: Internal Medicine

## 2016-08-15 ENCOUNTER — Encounter: Payer: Self-pay | Admitting: Internal Medicine

## 2016-08-15 VITALS — BP 124/60 | HR 64 | Temp 98.3°F | Ht 74.0 in | Wt 203.1 lb

## 2016-08-15 DIAGNOSIS — I255 Ischemic cardiomyopathy: Secondary | ICD-10-CM

## 2016-08-15 DIAGNOSIS — I5022 Chronic systolic (congestive) heart failure: Secondary | ICD-10-CM | POA: Diagnosis not present

## 2016-08-15 DIAGNOSIS — Z79899 Other long term (current) drug therapy: Secondary | ICD-10-CM | POA: Diagnosis not present

## 2016-08-15 DIAGNOSIS — E1140 Type 2 diabetes mellitus with diabetic neuropathy, unspecified: Secondary | ICD-10-CM

## 2016-08-15 DIAGNOSIS — Z794 Long term (current) use of insulin: Secondary | ICD-10-CM | POA: Diagnosis not present

## 2016-08-15 DIAGNOSIS — Z7901 Long term (current) use of anticoagulants: Secondary | ICD-10-CM | POA: Diagnosis not present

## 2016-08-15 DIAGNOSIS — D649 Anemia, unspecified: Secondary | ICD-10-CM

## 2016-08-15 DIAGNOSIS — N289 Disorder of kidney and ureter, unspecified: Secondary | ICD-10-CM

## 2016-08-15 DIAGNOSIS — M7989 Other specified soft tissue disorders: Secondary | ICD-10-CM | POA: Diagnosis not present

## 2016-08-15 DIAGNOSIS — Z789 Other specified health status: Secondary | ICD-10-CM

## 2016-08-15 LAB — BASIC METABOLIC PANEL
BUN: 42 mg/dL — AB (ref 6–23)
CO2: 32 meq/L (ref 19–32)
CREATININE: 2.21 mg/dL — AB (ref 0.40–1.50)
Calcium: 9.6 mg/dL (ref 8.4–10.5)
Chloride: 100 mEq/L (ref 96–112)
GFR: 30.14 mL/min — AB (ref >60.00)
GLUCOSE: 318 mg/dL — AB (ref 70–99)
Potassium: 4.6 mEq/L (ref 3.5–5.1)
Sodium: 138 mEq/L (ref 135–145)

## 2016-08-15 LAB — VITAMIN B12: Vitamin B-12: 330 pg/mL (ref 211–911)

## 2016-08-15 LAB — CBC WITH DIFFERENTIAL/PLATELET
BASOS PCT: 0.3 % (ref 0.0–3.0)
Basophils Absolute: 0 10*3/uL (ref 0.0–0.1)
EOS PCT: 0.5 % (ref 0.0–5.0)
Eosinophils Absolute: 0.1 10*3/uL (ref 0.0–0.7)
HEMATOCRIT: 33.9 % — AB (ref 39.0–52.0)
HEMOGLOBIN: 10.9 g/dL — AB (ref 13.0–17.0)
Lymphocytes Relative: 19.3 % (ref 12.0–46.0)
Lymphs Abs: 1.9 10*3/uL (ref 0.7–4.0)
MCHC: 32.2 g/dL (ref 30.0–36.0)
MCV: 81.1 fl (ref 78.0–100.0)
Monocytes Absolute: 0.7 10*3/uL (ref 0.1–1.0)
Monocytes Relative: 6.8 % (ref 3.0–12.0)
NEUTROS ABS: 7 10*3/uL (ref 1.4–7.7)
Neutrophils Relative %: 73.1 % (ref 43.0–77.0)
PLATELETS: 177 10*3/uL (ref 150.0–400.0)
RBC: 4.18 Mil/uL — ABNORMAL LOW (ref 4.22–5.81)
RDW: 16.4 % — AB (ref 11.5–15.5)
WBC: 9.6 10*3/uL (ref 4.0–10.5)

## 2016-08-15 LAB — HEPATIC FUNCTION PANEL
ALK PHOS: 74 U/L (ref 39–117)
ALT: 13 U/L (ref 0–53)
AST: 14 U/L (ref 0–37)
Albumin: 3.8 g/dL (ref 3.5–5.2)
BILIRUBIN DIRECT: 0.1 mg/dL (ref 0.0–0.3)
BILIRUBIN TOTAL: 0.4 mg/dL (ref 0.2–1.2)
TOTAL PROTEIN: 6.1 g/dL (ref 6.0–8.3)

## 2016-08-15 LAB — POCT URINALYSIS DIPSTICK
Bilirubin, UA: NEGATIVE
Ketones, UA: NEGATIVE
LEUKOCYTES UA: NEGATIVE
NITRITE UA: NEGATIVE
Protein, UA: NEGATIVE
RBC UA: NEGATIVE
Spec Grav, UA: 1.015 (ref 1.010–1.025)
UROBILINOGEN UA: 0.2 U/dL
pH, UA: 6 (ref 5.0–8.0)

## 2016-08-15 LAB — RETICULOCYTES
ABS RETIC: 59220 {cells}/uL (ref 25000–90000)
RBC.: 4.23 MIL/uL (ref 4.20–5.80)
RETIC CT PCT: 1.4 %

## 2016-08-15 LAB — HEMOGLOBIN A1C: Hgb A1c MFr Bld: 11.4 % — ABNORMAL HIGH (ref 4.6–6.5)
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
## 2016-08-15 NOTE — Patient Instructions (Signed)
Lab today to get more information about the anemia .   Then plan follow up.  depending on results  Uncertain about the  Left arm swelling off and on .    May end up getting    Vascular study ( veins)   Your exam is reassuring today .

## 2016-08-16 LAB — IRON,TIBC AND FERRITIN PANEL
%SAT: 10 % — ABNORMAL LOW (ref 15–60)
Ferritin: 13 ng/mL — ABNORMAL LOW (ref 20–380)
Iron: 41 ug/dL — ABNORMAL LOW (ref 50–180)
TIBC: 391 ug/dL (ref 250–425)

## 2016-08-20 ENCOUNTER — Other Ambulatory Visit: Payer: Self-pay | Admitting: *Deleted

## 2016-08-20 ENCOUNTER — Other Ambulatory Visit: Payer: Self-pay | Admitting: Emergency Medicine

## 2016-08-20 MED ORDER — INSULIN DEGLUDEC 100 UNIT/ML ~~LOC~~ SOPN: PEN_INJECTOR | SUBCUTANEOUS | 0 refills | Status: DC

## 2016-08-24 ENCOUNTER — Encounter: Payer: Self-pay | Admitting: Podiatry

## 2016-08-24 ENCOUNTER — Ambulatory Visit (INDEPENDENT_AMBULATORY_CARE_PROVIDER_SITE_OTHER): Payer: Medicare Other | Admitting: Podiatry

## 2016-08-24 DIAGNOSIS — B351 Tinea unguium: Secondary | ICD-10-CM | POA: Diagnosis not present

## 2016-08-24 DIAGNOSIS — Q828 Other specified congenital malformations of skin: Secondary | ICD-10-CM | POA: Diagnosis not present

## 2016-08-24 DIAGNOSIS — M79676 Pain in unspecified toe(s): Secondary | ICD-10-CM

## 2016-08-24 DIAGNOSIS — M216X9 Other acquired deformities of unspecified foot: Secondary | ICD-10-CM

## 2016-08-24 DIAGNOSIS — E1140 Type 2 diabetes mellitus with diabetic neuropathy, unspecified: Secondary | ICD-10-CM | POA: Diagnosis not present

## 2016-08-24 NOTE — Progress Notes (Signed)
Patient ID: [name], male   DOB: Feb 03, 1929, 83 y.o.   MRN: [id number] Complaint:  Visit Type: Patient returns to my office for continued preventative foot care services. Complaint: Patient states" my nails have grown long and thick and become painful to walk and wear shoes" Patient has been diagnosed with DM with neuropathy.[name] He presents for preventative foot care services. No changes to ROS.  He also has painful callus under the outside ball of both feet.  Podiatric Exam: Vascular: dorsalis pedis and posterior tibial pulses are palpable bilateral. Capillary return is immediate. Temperature gradient is WNL. Skin turgor WNL  Sensorium: Diminished  Semmes Weinstein monofilament test. Normal tactile sensation bilaterally. Nail Exam: Pt has thick disfigured discolored nails with subungual debris noted bilateral entire nail hallux through fifth toenails Ulcer Exam: There is no evidence of ulcer or pre-ulcerative changes or infection. Orthopedic Exam: Muscle tone and strength are WNL. No limitations in general ROM. No crepitus or effusions noted. Foot type and digits show no abnormalities. Bony prominences are unremarkable. Asymptomatic HAV B/L. Skin:  Porokeratosis sub 5th metatarsal bilateral.. No infection or ulcers.  Symptomatic Callus both feet.  Diagnosis:  Tinea unguium, Pain in right toe, pain in left toes, Porokeratosis  Treatment & Plan Procedures and Treatment: Consent by patient was obtained for treatment procedures. The patient understood the discussion of treatment and procedures well. All questions were answered thoroughly reviewed. Debridement of mycotic and hypertrophic toenails, 1 through 5 bilateral and clearing of subungual debris. No ulceration, no infection noted.  Debride porokeratosis Return Visit-Office Procedure: Patient instructed to return to the office for a follow up visit 10 weeks  for continued evaluation and treatment.   Gardiner Barefoot DPM

## 2016-08-29 ENCOUNTER — Other Ambulatory Visit (HOSPITAL_COMMUNITY): Payer: Self-pay | Admitting: Cardiology

## 2016-08-29 MED ORDER — DIGOXIN 125 MCG PO TABS: ORAL_TABLET | ORAL | 3 refills | Status: DC
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
## 2016-09-13 ENCOUNTER — Ambulatory Visit (INDEPENDENT_AMBULATORY_CARE_PROVIDER_SITE_OTHER): Payer: Medicare Other | Admitting: Internal Medicine

## 2016-09-13 ENCOUNTER — Encounter: Payer: Self-pay | Admitting: Internal Medicine

## 2016-09-13 VITALS — BP 104/64 | HR 60 | Temp 98.3°F | Wt 199.4 lb

## 2016-09-13 DIAGNOSIS — D649 Anemia, unspecified: Secondary | ICD-10-CM

## 2016-09-13 DIAGNOSIS — R7989 Other specified abnormal findings of blood chemistry: Secondary | ICD-10-CM | POA: Diagnosis not present

## 2016-09-13 DIAGNOSIS — E1140 Type 2 diabetes mellitus with diabetic neuropathy, unspecified: Secondary | ICD-10-CM

## 2016-09-13 DIAGNOSIS — I255 Ischemic cardiomyopathy: Secondary | ICD-10-CM | POA: Diagnosis not present

## 2016-09-13 DIAGNOSIS — Z794 Long term (current) use of insulin: Secondary | ICD-10-CM

## 2016-09-13 DIAGNOSIS — Z7901 Long term (current) use of anticoagulants: Secondary | ICD-10-CM | POA: Diagnosis not present

## 2016-09-13 DIAGNOSIS — Z79899 Other long term (current) drug therapy: Secondary | ICD-10-CM

## 2016-09-13 DIAGNOSIS — E538 Deficiency of other specified B group vitamins: Secondary | ICD-10-CM

## 2016-09-13 DIAGNOSIS — Z789 Other specified health status: Secondary | ICD-10-CM | POA: Diagnosis not present

## 2016-09-13 MED ORDER — SAXAGLIPTIN HCL 5 MG PO TABS: 5.0000 mg | ORAL_TABLET | Freq: Every day | ORAL | 1 refills | Status: DC

## 2016-09-13 MED ORDER — INSULIN DEGLUDEC 100 UNIT/ML ~~LOC~~ SOPN: PEN_INJECTOR | SUBCUTANEOUS | 0 refills | Status: DC

## 2016-09-13 MED ORDER — GLIPIZIDE 10 MG PO TABS: 10.0000 mg | ORAL_TABLET | Freq: Every day | ORAL | 1 refills | Status: DC

## 2016-09-13 MED ORDER — ESCITALOPRAM OXALATE 10 MG PO TABS: 10.0000 mg | ORAL_TABLET | Freq: Every day | ORAL | 1 refills | Status: DC

## 2016-09-13 MED ORDER — PRAMIPEXOLE DIHYDROCHLORIDE 0.5 MG PO TABS: ORAL_TABLET | ORAL | 1 refills | Status: DC

## 2016-09-13 NOTE — Patient Instructions (Signed)
Your sugars are getting better . Increase the tresiba to 32 units  . Per day . Continue monitoring the blood sugars .  Plan ROV in 2 months and  Plan lab before visit  I will pu in orders ( NF ) to  Include  Hg a1c cbcdiff  Vit b12 and other as indicated   Limit sweets as planned .   Spirololactone should be  rx by cardiology team   ask dr Wayne Both al or other cardiology team to refill the spironolactone  .   But if running out  We can order an interim rx .

## 2016-09-20 ENCOUNTER — Ambulatory Visit: Payer: Medicare Other | Admitting: Internal Medicine

## 2016-09-27 ENCOUNTER — Other Ambulatory Visit: Payer: Self-pay | Admitting: Internal Medicine

## 2016-09-27 NOTE — Telephone Encounter (Signed)
Ok to refill for 6 months 

## 2016-09-28 ENCOUNTER — Other Ambulatory Visit: Payer: Self-pay | Admitting: Emergency Medicine

## 2016-09-28 MED ORDER — INSULIN DEGLUDEC 100 UNIT/ML ~~LOC~~ SOPN: PEN_INJECTOR | SUBCUTANEOUS | 1 refills | Status: DC

## 2016-10-02 ENCOUNTER — Encounter: Payer: Self-pay | Admitting: Pulmonary Disease

## 2016-10-02 ENCOUNTER — Ambulatory Visit (INDEPENDENT_AMBULATORY_CARE_PROVIDER_SITE_OTHER): Payer: Medicare Other | Admitting: Pulmonary Disease

## 2016-10-02 VITALS — BP 116/62 | HR 60 | Ht 74.0 in | Wt 198.2 lb

## 2016-10-02 DIAGNOSIS — Z9989 Dependence on other enabling machines and devices: Secondary | ICD-10-CM

## 2016-10-02 DIAGNOSIS — I255 Ischemic cardiomyopathy: Secondary | ICD-10-CM

## 2016-10-02 DIAGNOSIS — G4733 Obstructive sleep apnea (adult) (pediatric): Secondary | ICD-10-CM | POA: Diagnosis not present

## 2016-10-02 NOTE — Progress Notes (Signed)
Current Outpatient Prescriptions on File Prior to Visit  Medication Sig  . acetaminophen (TYLENOL) 500 MG tablet Take 500 mg by mouth daily as needed for mild pain.  [name] amiodarone (PACERONE) 200 MG tablet Take 0.5 tablets (100 mg total) by mouth daily.  [name] apixaban (ELIQUIS) 2.5 MG TABS tablet Take 1 tablet (2.5 mg total) by mouth 2 (two) times daily.  [name] atorvastatin (LIPITOR) 40 MG tablet TAKE 1 TABLET DAILY  . carvedilol (COREG) 6.25 MG tablet Take 0.5 tablets (3.125 mg total) by mouth 2 (two) times daily.  . digoxin (DIGOX) 0.125 MG tablet TAKE 1/2 TABLET(0.063 MG) BY MOUTH DAILY  Needs office visit  . escitalopram (LEXAPRO) 10 MG tablet Take 1 tablet (10 mg total) by mouth daily.  [name] gabapentin (NEURONTIN) 100 MG capsule Take 1 capsule (100 mg total) by mouth 3 (three) times daily. Can increase as directed (Patient taking differently: Take 100 mg by mouth daily as needed (pain and sleep). Can increase as directed)  . glipiZIDE (GLUCOTROL) 10 MG tablet Take 1 tablet (10 mg total) by mouth daily.  [name] glucose blood (ONE TOUCH TEST STRIPS) test strip Use to test blood sugar 2-3 times daily  . hydrALAZINE (APRESOLINE) 25 MG tablet Take 1 tablet (25 mg total) by mouth every 8 (eight) hours.  . insulin degludec (TRESIBA FLEXTOUCH) 100 UNIT/ML SOPN FlexTouch Pen Take 32 units per day. If fasting blood surgar over 150 after a week, then increase to 34 units per day .  [name] Insulin Pen Needle (BD PEN NEEDLE NANO U/F) 32G X 4 MM MISC Use to test blood glucose three times daily  . isosorbide mononitrate (IMDUR) 30 MG 24 hr tablet Take 1 tablet (30 mg total) by mouth daily.  . Menthol, Topical Analgesic, (ICY HOT EX) Apply 1 application topically at bedtime as needed (leg pain).  . Multiple Vitamin (MULTIVITAMIN WITH MINERALS) TABS tablet Take 1 tablet by mouth daily.  Glory Rosebush DELICA LANCETS FINE MISC Use to test blood sugar 2-3 times daily  . polyethylene glycol (MIRALAX) packet Take 17 g by mouth daily.  (Patient taking differently: Take 17 g by mouth daily as needed for mild constipation. )  . potassium chloride SA (K-DUR,KLOR-CON) 20 MEQ tablet Take 2 tablets (40 mEq total) by mouth daily.  . pramipexole (MIRAPEX) 0.5 MG tablet TAKE 2-3 HOURS BEFORE SLEEPFOR RESTLESS LEG  . saxagliptin HCl (ONGLYZA) 5 MG TABS tablet Take 1 tablet (5 mg total) by mouth daily.  . silodosin (RAPAFLO) 8 MG CAPS capsule Take 1 capsule (8 mg total) by mouth daily with breakfast.  . spironolactone (ALDACTONE) 25 MG tablet Take 1 tablet (25 mg total) by mouth daily.  [name] torsemide (DEMADEX) 20 MG tablet Take 3 tablets (60 mg total) by mouth daily. Take extra as directed for wt gain   No current facility-administered medications on file prior to visit.     Chief Complaint  Patient presents with  . Follow-up    Wears CPAP nightly. Denies any problems with mask or pressure. DME: [employer]    Sleep tests HST 07/17/09 >> AHI 8.4, RDI 13.9 CPAP 07/05/15 to 10/02/15 >> used on 90 of 90 nights with average 7 hrs 29 min.  Average AHI 5.1 with CPAP 7 cm H2O  Cardiac tests Echo 07/12/15 >> mild LVH, EF 41%, grade 1 diastolic dysfx, mild AS, mild MR  Past medical history CVA, ischemic CM s/p AICD, A fib, HLD, HTN, RLS, CAD, DM, Depression, autoimmune hemolytic anemia  Past surgical  history, Family history, Social history, Allergies reviewed.  Vital signs BP 116/62 (BP Location: Left Arm, Cuff Size: Normal)   Pulse 60   Ht 6\' 2"  (1.88 m)   Wt 198 lb 3.2 oz (89.9 kg)   SpO2 96%   BMI 25.45 kg/m    History of Present Illness: [name] is a 83 y.o. male with OSA.  He uses CPAP nightly.  He has full face mask, and no issue with mask fit.  He was switched to [place], and this has worked well.  He gets about 8 hrs sleep per night.  Wakes up every 3 hours to use the bathroom.  He naps for 45 minutes in the afternoon >> not using CPAP then.  Physical Exam:  General - pleasant Eyes - pupils reactive, wears  glasses ENT - no sinus tenderness, no oral exudate, no LAN Cardiac - regular, no murmur Chest - no wheeze, rales Abd - soft, non tender Ext - no edema Skin - no rashes Neuro - normal strength Psych - normal mood   Assessment/Plan:  Obstructive sleep apnea. - he is compliant with therapy and reports benefit - continue CPAP 7 cm H2O - he will bring his SD card to office to get download >> will call with results   Patient Instructions  Bring your CPAP chip to the office to get a download when you can >> we will call with results  Follow up in 1 year   Chesley Mires, MD [place] Pager:  [phone] 10/02/2016, 11:00 AM

## 2016-10-02 NOTE — Patient Instructions (Signed)
Bring your CPAP chip to the office to get a download when you can >> we will call with results  Follow up in 1 year

## 2016-10-03 ENCOUNTER — Encounter (HOSPITAL_COMMUNITY): Payer: Self-pay | Admitting: Cardiology

## 2016-10-03 ENCOUNTER — Encounter: Payer: Self-pay | Admitting: Cardiology

## 2016-10-03 ENCOUNTER — Ambulatory Visit (HOSPITAL_COMMUNITY)
Admission: RE | Admit: 2016-10-03 | Discharge: 2016-10-03 | Disposition: A | Discharge status: Home / Self Care | Payer: Medicare Other | Source: Ambulatory Visit | Attending: Cardiology | Admitting: Cardiology

## 2016-10-03 ENCOUNTER — Other Ambulatory Visit: Payer: Self-pay | Admitting: Internal Medicine

## 2016-10-03 VITALS — BP 122/58 | HR 61 | Wt 197.5 lb

## 2016-10-03 DIAGNOSIS — E785 Hyperlipidemia, unspecified: Secondary | ICD-10-CM | POA: Diagnosis not present

## 2016-10-03 DIAGNOSIS — I2510 Atherosclerotic heart disease of native coronary artery without angina pectoris: Secondary | ICD-10-CM | POA: Diagnosis not present

## 2016-10-03 DIAGNOSIS — F329 Major depressive disorder, single episode, unspecified: Secondary | ICD-10-CM | POA: Insufficient documentation

## 2016-10-03 DIAGNOSIS — I255 Ischemic cardiomyopathy: Secondary | ICD-10-CM | POA: Diagnosis not present

## 2016-10-03 DIAGNOSIS — I480 Paroxysmal atrial fibrillation: Secondary | ICD-10-CM | POA: Diagnosis not present

## 2016-10-03 DIAGNOSIS — N183 Chronic kidney disease, stage 3 (moderate): Secondary | ICD-10-CM | POA: Diagnosis not present

## 2016-10-03 DIAGNOSIS — Z9889 Other specified postprocedural states: Secondary | ICD-10-CM | POA: Diagnosis not present

## 2016-10-03 DIAGNOSIS — M1990 Unspecified osteoarthritis, unspecified site: Secondary | ICD-10-CM | POA: Diagnosis not present

## 2016-10-03 DIAGNOSIS — G4733 Obstructive sleep apnea (adult) (pediatric): Secondary | ICD-10-CM | POA: Diagnosis not present

## 2016-10-03 DIAGNOSIS — Z951 Presence of aortocoronary bypass graft: Secondary | ICD-10-CM | POA: Diagnosis not present

## 2016-10-03 DIAGNOSIS — E784 Other hyperlipidemia: Secondary | ICD-10-CM

## 2016-10-03 DIAGNOSIS — I5022 Chronic systolic (congestive) heart failure: Secondary | ICD-10-CM | POA: Diagnosis not present

## 2016-10-03 DIAGNOSIS — Z85820 Personal history of malignant melanoma of skin: Secondary | ICD-10-CM | POA: Diagnosis not present

## 2016-10-03 DIAGNOSIS — E7849 Other hyperlipidemia: Secondary | ICD-10-CM

## 2016-10-03 DIAGNOSIS — I130 Hypertensive heart and chronic kidney disease with heart failure and stage 1 through stage 4 chronic kidney disease, or unspecified chronic kidney disease: Secondary | ICD-10-CM | POA: Insufficient documentation

## 2016-10-03 DIAGNOSIS — G2581 Restless legs syndrome: Secondary | ICD-10-CM | POA: Insufficient documentation

## 2016-10-03 DIAGNOSIS — I6529 Occlusion and stenosis of unspecified carotid artery: Secondary | ICD-10-CM | POA: Insufficient documentation

## 2016-10-03 DIAGNOSIS — E1122 Type 2 diabetes mellitus with diabetic chronic kidney disease: Secondary | ICD-10-CM | POA: Insufficient documentation

## 2016-10-03 DIAGNOSIS — Z794 Long term (current) use of insulin: Secondary | ICD-10-CM | POA: Insufficient documentation

## 2016-10-03 DIAGNOSIS — Z8249 Family history of ischemic heart disease and other diseases of the circulatory system: Secondary | ICD-10-CM | POA: Insufficient documentation

## 2016-10-03 DIAGNOSIS — Z87891 Personal history of nicotine dependence: Secondary | ICD-10-CM | POA: Insufficient documentation

## 2016-10-03 LAB — COMPREHENSIVE METABOLIC PANEL
ALT: 14 U/L — AB (ref 17–63)
ANION GAP: 7 (ref 5–15)
AST: 19 U/L (ref 15–41)
Albumin: 3.5 g/dL (ref 3.5–5.0)
Alkaline Phosphatase: 59 U/L (ref 38–126)
BUN: 48 mg/dL — ABNORMAL HIGH (ref 6–20)
CHLORIDE: 103 mmol/L (ref 101–111)
CO2: 29 mmol/L (ref 22–32)
CREATININE: 1.97 mg/dL — AB (ref 0.61–1.24)
Calcium: 9 mg/dL (ref 8.9–10.3)
GFR, EST AFRICAN AMERICAN: 34 mL/min — AB (ref >60)
GFR, EST NON AFRICAN AMERICAN: 29 mL/min — AB (ref >60)
Glucose, Bld: 152 mg/dL — ABNORMAL HIGH (ref 65–99)
Potassium: 4.9 mmol/L (ref 3.5–5.1)
Sodium: 139 mmol/L (ref 135–145)
Total Bilirubin: 0.9 mg/dL (ref 0.3–1.2)
Total Protein: 6.2 g/dL — ABNORMAL LOW (ref 6.5–8.1)

## 2016-10-03 LAB — LIPID PANEL
CHOLESTEROL: 72 mg/dL (ref 0–200)
HDL: 32 mg/dL — ABNORMAL LOW (ref >40)
LDL Cholesterol: 17 mg/dL (ref 0–99)
Total CHOL/HDL Ratio: 2.3 RATIO
Triglycerides: 113 mg/dL (ref <150)
VLDL: 23 mg/dL (ref 0–40)

## 2016-10-03 LAB — TSH: TSH: 2.1 u[IU]/mL (ref 0.350–4.500)

## 2016-10-03 LAB — DIGOXIN LEVEL: DIGOXIN LVL: 0.8 ng/mL (ref 0.8–2.0)

## 2016-10-03 MED ORDER — APIXABAN 2.5 MG PO TABS: 2.5000 mg | ORAL_TABLET | Freq: Two times a day (BID) | ORAL | 3 refills | Status: DC

## 2016-10-03 MED ORDER — SPIRONOLACTONE 25 MG PO TABS: 25.0000 mg | ORAL_TABLET | Freq: Every day | ORAL | 3 refills | Status: DC

## 2016-10-03 MED ORDER — CARVEDILOL 6.25 MG PO TABS: 6.2500 mg | ORAL_TABLET | Freq: Two times a day (BID) | ORAL | 3 refills | Status: DC

## 2016-10-03 NOTE — Progress Notes (Signed)
Patient ID: [name], male   DOB: Jun 11, 1928, 83 y.o.   MRN: [id number] PCP: Dr. Regis Bill Cardiology: Dr. [name]  83 yo with history of CAD s/p CABG, ischemic cardiomyopathy, and paroxysmal atrial fibrillation presents for followup.  Patient was admitted in 2/16 with CHF and diuresed, he was sent home on Lasix 60 mg bid.  He missed 3 days of the pm Lasix dose and was re-admitted on 05/18/14 with acute on chronic systolic CHF with dyspnea and hypoxemia. He was diuresed with Lasix gtt and metolazone.  While in the hospital, he went into atrial fibrillation with RVR requiring cardioversion.  Creatinine was elevated and ramipril was stopped.  We had to cut back on Coreg due to hypotension and low output.  He was begun on low dose digoxin.    He returns for followup today.  No dyspnea walking on flat ground.  He is short of breath if he walks fast up stairs or a hill.  Some unsteadiness but no lightheadedness.  He fell about 2 months ago (tripped).  No palpitations, no atrial fibrillation on device interrogation.  Weight is down 1 lb.   Corevue (personally reviewed): >99% BiV pacing, stable impedance. No atrial fibrillation or VT.   Labs (3/16): K 4.2, creatinine 2.26, digoxin < 0.2 Labs (05/31/2014): TSH 2.3 Dig level 0.6  Labs (06/14/2014): K 4.5 Creatinine 2.14, LDL 28, HDL 46 Labs (6/16): K 4.5, creatinine 2.08, BNP 712 Labs (7/16): K 4.1, creatinine 2.18, HCT 36.8, LFTs normal, digoxin 0.9 Labs (10/16): LDL 26, HDL 38, TSH normal, LFTs normal Labs (12/16): K 4.6, creatinine 2 Labs (2/17): LFTs normal, digoxin 1.1 Labs (4/17): K 4.7, creatinine 1.97, BUN 50, hgb 12.8, TSH normal, digoxin 0.8, LDL 29 Labs (7/17): digoxin 0.6 Labs (11/17): K 4.4, creatinine 2.13 Labs (12/17): hgb 11.4 Labs (5/18): LFTs normal, K 4.6, creatinne 2.21, hgb 10.9  PMH: 1. CAD: S/p CABG 2005 with LIMA-Diagonal, SVG-LAD, SVG-LCx, SVG-RCA.  Cardiolite in 2/15 with EF 19%, no ischemia.  2. Ischemic cardiomyopathy: Echo  (2/16) with EF 20-25%, severe LV dilation with RWMAs, moderate AI, moderate MR, moderately decreased RV systolic function.  St Jude CRT-D device.  3. Atrial fibrillation: Paroxysmal.  DCCV 3/16.  4. CKD 5. Carotid stenosis: Carotid dopplers (1/16) with 60-79% RICA stenosis.  Carotids (1/17) with bilateral 40-59% ICA stenosis.  - Carotids (1/18): 40-59% BICA stenosis.  6. HTN 7. Hyperlipidemia 8. Restless leg syndrome. 9. Type 2 diabetes. 10. OA 11. Depression 12. H/o CCY 39. H/o appy 14. OSA: Uses CPAP.  15. ABIs (7/17): Normal 16. Melanoma: s/p excision.   SH: Married, quit smoking 1980, [place] [employer] vet  FH: CAD  ROS: All systems reviewed and negative except as per HPI.   Current Outpatient Prescriptions  Medication Sig Dispense Refill  . acetaminophen (TYLENOL) 500 MG tablet Take 500 mg by mouth daily as needed for mild pain.    [name] amiodarone (PACERONE) 200 MG tablet Take 0.5 tablets (100 mg total) by mouth daily.    [name] apixaban (ELIQUIS) 2.5 MG TABS tablet Take 1 tablet (2.5 mg total) by mouth 2 (two) times daily. 180 tablet 3  . atorvastatin (LIPITOR) 40 MG tablet TAKE 1 TABLET DAILY 90 tablet 3  . carvedilol (COREG) 6.25 MG tablet Take 1 tablet (6.25 mg total) by mouth 2 (two) times daily. 180 tablet 3  . digoxin (DIGOX) 0.125 MG tablet TAKE 1/2 TABLET(0.063 MG) BY MOUTH DAILY  Needs office visit 15 tablet 3  . escitalopram (LEXAPRO) 10  MG tablet Take 1 tablet (10 mg total) by mouth daily. 90 tablet 1  . gabapentin (NEURONTIN) 100 MG capsule Take 1 capsule (100 mg total) by mouth 3 (three) times daily. Can increase as directed (Patient taking differently: Take 100 mg by mouth daily as needed (pain and sleep). Can increase as directed) 90 capsule 3  . glipiZIDE (GLUCOTROL) 10 MG tablet Take 1 tablet (10 mg total) by mouth daily. 90 tablet 1  . glucose blood (ONE TOUCH TEST STRIPS) test strip Use to test blood sugar 2-3 times daily 300 each 1  . hydrALAZINE (APRESOLINE) 25 MG  tablet Take 1 tablet (25 mg total) by mouth every 8 (eight) hours. 270 tablet 3  . insulin degludec (TRESIBA FLEXTOUCH) 100 UNIT/ML SOPN FlexTouch Pen Take 32 units per day. If fasting blood surgar over 150 after a week, then increase to 34 units per day . 5 pen 1  . Insulin Pen Needle (BD PEN NEEDLE NANO U/F) 32G X 4 MM MISC Use to test blood glucose three times daily 300 each 3  . isosorbide mononitrate (IMDUR) 30 MG 24 hr tablet Take 1 tablet (30 mg total) by mouth daily. 90 tablet 3  . Menthol, Topical Analgesic, (ICY HOT EX) Apply 1 application topically at bedtime as needed (leg pain).    . Multiple Vitamin (MULTIVITAMIN WITH MINERALS) TABS tablet Take 1 tablet by mouth daily.    Glory Rosebush DELICA LANCETS FINE MISC Use to test blood sugar 2-3 times daily 300 each 1  . polyethylene glycol (MIRALAX) packet Take 17 g by mouth daily. (Patient taking differently: Take 17 g by mouth daily as needed for mild constipation. ) 14 each 0  . potassium chloride SA (K-DUR,KLOR-CON) 20 MEQ tablet Take 2 tablets (40 mEq total) by mouth daily. 180 tablet 3  . pramipexole (MIRAPEX) 0.5 MG tablet TAKE 2-3 HOURS BEFORE SLEEPFOR RESTLESS LEG 90 tablet 1  . saxagliptin HCl (ONGLYZA) 5 MG TABS tablet Take 1 tablet (5 mg total) by mouth daily. 90 tablet 1  . silodosin (RAPAFLO) 8 MG CAPS capsule Take 1 capsule (8 mg total) by mouth daily with breakfast. 90 capsule 0  . spironolactone (ALDACTONE) 25 MG tablet Take 1 tablet (25 mg total) by mouth daily. 90 tablet 3  . torsemide (DEMADEX) 20 MG tablet Take 3 tablets (60 mg total) by mouth daily. Take extra as directed for wt gain 270 tablet 3   No current facility-administered medications for this encounter.    BP (!) 122/58   Pulse 61   Wt 197 lb 8 oz (89.6 kg)   SpO2 98%   BMI 25.36 kg/m  General: NAD.  Neck: JVP not elevated, no thyromegaly or thyroid nodule.  Lungs: CTAB CV: Nondisplaced PMI.  Heart regular S1/S2, no S3/S4, 2/6 HSM at apex.  Trace ankle  edema. Left carotid bruit.  Unable to palpate pedal pulses.  Abdomen: Soft, nontender, no hepatosplenomegaly,no abdominal distention.  Skin: Intact without lesions or rashes.  Neurologic: Alert and oriented x 3.  Psych: Normal affect. Extremities: No clubbing or cyanosis.  HEENT: Normal.   Assessment/Plan: 1. Chronic systolic CHF: Ischemic cardiomyopathy.  EF 20-25% by 2/16 echo with moderate RV dysfunction.  Stable NYHA class II symptoms. St Jude CRT-D device.  He is not volume overloaded on exam or by Corevue. - Continue torsemide 60 mg daily.  Check BMET.     - Continue current hydralazine/Imdur  - Will leave off ACEI for now with elevated creatinine.  -  Continue current spironolactone.  - Continue current digoxin.  Check level today.   - Increase Coreg to 6.25 mg bid. - I will arrange for repeat echo.   2. Atrial fibrillation: Paroxysmal.  No recent atrial fibrillation by device interrogation, he is in NSR today.  Continue amiodarone and Eliquis (dosed at 2.5 mg bid with age and renal dysfunction).  Will need to make sure to have regular eye exam with amiodarone.  Check LFTs and TSH today.  3. CKD: Stage 3.  Will remain off ACEI for now.   4. CAD: No chest pain, stable.  Continue on statin.  He is not on ASA 81 given stable CAD and Eliquis use.   5. Carotid stenosis: Repeat carotid dopplers 1/19.  6. Diabetes: He is taking saxagliptin, which is not ideal with CHF.  Would consider transitioning off this and onto empagliflozin.   7. obstructive sleep apnea: Using CPAP.   Follow up in 4 months.   [name] 10/03/2016

## 2016-10-03 NOTE — Patient Instructions (Addendum)
Increase Carvedilol to 6.25 mg Twice daily   Labs today  Talk with your primary care MD about possible changing your Saxagliptin to Jardiance instead   Your physician has requested that you have an echocardiogram. Echocardiography is a painless test that uses sound waves to create images of your heart. It provides your doctor with information about the size and shape of your heart and how well your heart's chambers and valves are working. This procedure takes approximately one hour. There are no restrictions for this procedure.  We will contact you in 4 months to schedule your next appointment.

## 2016-10-03 NOTE — Addendum Note (Signed)
Encounter addended by: Larey Dresser, MD on: 10/03/2016 10:34 PM<BR>    Actions taken: LOS modified

## 2016-10-04 ENCOUNTER — Other Ambulatory Visit: Payer: Self-pay | Admitting: Emergency Medicine

## 2016-10-04 ENCOUNTER — Telehealth: Payer: Self-pay | Admitting: Internal Medicine

## 2016-10-04 MED ORDER — INSULIN DEGLUDEC 100 UNIT/ML ~~LOC~~ SOPN: PEN_INJECTOR | SUBCUTANEOUS | 1 refills | Status: DC

## 2016-10-04 NOTE — Telephone Encounter (Signed)
Rx has been sent to the [facility] pharmacy

## 2016-10-04 NOTE — Telephone Encounter (Signed)
Pt was just seen in June and needs refill on insulin degludec flex pen

## 2016-10-17 ENCOUNTER — Other Ambulatory Visit: Payer: Self-pay | Admitting: Internal Medicine

## 2016-10-19 ENCOUNTER — Other Ambulatory Visit: Payer: Self-pay | Admitting: Internal Medicine

## 2016-10-31 ENCOUNTER — Ambulatory Visit (INDEPENDENT_AMBULATORY_CARE_PROVIDER_SITE_OTHER): Payer: Medicare Other | Admitting: *Deleted

## 2016-10-31 ENCOUNTER — Ambulatory Visit (INDEPENDENT_AMBULATORY_CARE_PROVIDER_SITE_OTHER): Payer: Medicare Other | Admitting: Podiatry

## 2016-10-31 ENCOUNTER — Encounter: Payer: Self-pay | Admitting: Podiatry

## 2016-10-31 DIAGNOSIS — E1140 Type 2 diabetes mellitus with diabetic neuropathy, unspecified: Secondary | ICD-10-CM

## 2016-10-31 DIAGNOSIS — M79676 Pain in unspecified toe(s): Secondary | ICD-10-CM

## 2016-10-31 DIAGNOSIS — I5022 Chronic systolic (congestive) heart failure: Secondary | ICD-10-CM

## 2016-10-31 DIAGNOSIS — Q828 Other specified congenital malformations of skin: Secondary | ICD-10-CM | POA: Diagnosis not present

## 2016-10-31 DIAGNOSIS — I255 Ischemic cardiomyopathy: Secondary | ICD-10-CM

## 2016-10-31 DIAGNOSIS — B351 Tinea unguium: Secondary | ICD-10-CM

## 2016-10-31 NOTE — Progress Notes (Signed)
Patient ID: [name], male   DOB: 1928-06-24, 83 y.o.   MRN: [id number] Complaint:  Visit Type: Patient returns to my office for continued preventative foot care services. Complaint: Patient states" my nails have grown long and thick and become painful to walk and wear shoes" Patient has been diagnosed with DM with neuropathy.[name] He presents for preventative foot care services. No changes to ROS.  He also has painful callus under the outside ball of both feet.  Podiatric Exam: Vascular: dorsalis pedis and posterior tibial pulses are palpable bilateral. Capillary return is immediate. Temperature gradient is WNL. Skin turgor WNL  Sensorium: Diminished  Semmes Weinstein monofilament test. Normal tactile sensation bilaterally. Nail Exam: Pt has thick disfigured discolored nails with subungual debris noted bilateral entire nail hallux through fifth toenails Ulcer Exam: There is no evidence of ulcer or pre-ulcerative changes or infection. Orthopedic Exam: Muscle tone and strength are WNL. No limitations in general ROM. No crepitus or effusions noted. Foot type and digits show no abnormalities. Bony prominences are unremarkable. Asymptomatic HAV B/L. Skin:  Porokeratosis sub 5th metatarsal bilateral.. No infection or ulcers.  Symptomatic Callus both feet. Clavi 3rd toe right foot.  Diagnosis:  Tinea unguium, Pain in right toe, pain in left toes, Porokeratosis  Treatment & Plan Procedures and Treatment: Consent by patient was obtained for treatment procedures. The patient understood the discussion of treatment and procedures well. All questions were answered thoroughly reviewed. Debridement of mycotic and hypertrophic toenails, 1 through 5 bilateral and clearing of subungual debris. No ulceration, no infection noted.  Debride porokeratosis Return Visit-Office Procedure: Patient instructed to return to the office for a follow up visit 10 weeks  for continued evaluation and treatment.   Gardiner Barefoot  DPM

## 2016-10-31 NOTE — Progress Notes (Signed)
Remote defibrillator check.  

## 2016-11-02 ENCOUNTER — Ambulatory Visit: Payer: Medicare Other | Admitting: Podiatry

## 2016-11-06 ENCOUNTER — Other Ambulatory Visit: Payer: Self-pay

## 2016-11-06 ENCOUNTER — Ambulatory Visit (HOSPITAL_COMMUNITY): Payer: Medicare Other | Attending: Cardiovascular Disease

## 2016-11-06 DIAGNOSIS — I351 Nonrheumatic aortic (valve) insufficiency: Secondary | ICD-10-CM | POA: Diagnosis not present

## 2016-11-06 DIAGNOSIS — I5022 Chronic systolic (congestive) heart failure: Secondary | ICD-10-CM | POA: Diagnosis not present

## 2016-11-06 MED ORDER — PERFLUTREN LIPID MICROSPHERE
1.0000 mL | INTRAVENOUS | Status: AC | PRN
  Administered 2016-11-06: 2 mL via INTRAVENOUS

## 2016-11-07 ENCOUNTER — Other Ambulatory Visit: Payer: Self-pay | Admitting: Dermatology

## 2016-11-07 DIAGNOSIS — L570 Actinic keratosis: Secondary | ICD-10-CM | POA: Diagnosis not present

## 2016-11-07 DIAGNOSIS — D485 Neoplasm of uncertain behavior of skin: Secondary | ICD-10-CM | POA: Diagnosis not present

## 2016-11-07 DIAGNOSIS — D1801 Hemangioma of skin and subcutaneous tissue: Secondary | ICD-10-CM | POA: Diagnosis not present

## 2016-11-07 DIAGNOSIS — D229 Melanocytic nevi, unspecified: Secondary | ICD-10-CM | POA: Diagnosis not present

## 2016-11-07 DIAGNOSIS — L821 Other seborrheic keratosis: Secondary | ICD-10-CM | POA: Diagnosis not present

## 2016-11-07 DIAGNOSIS — D044 Carcinoma in situ of skin of scalp and neck: Secondary | ICD-10-CM | POA: Diagnosis not present

## 2016-11-07 DIAGNOSIS — C4442 Squamous cell carcinoma of skin of scalp and neck: Secondary | ICD-10-CM | POA: Diagnosis not present

## 2016-11-09 LAB — CUP PACEART REMOTE DEVICE CHECK
Battery Voltage: 2.87 V
Brady Statistic AP VS Percent: 1 %
Brady Statistic AS VP Percent: 2.1 %
HighPow Impedance: 41 Ohm
HighPow Impedance: 44 Ohm
Implantable Lead Implant Date: 20150304
Implantable Lead Implant Date: 20150304
Implantable Lead Location: 753859
Implantable Lead Location: 753860
Implantable Lead Model: 1581
Implantable Pulse Generator Implant Date: 20150304
Lead Channel Impedance Value: 380 Ohm
Lead Channel Pacing Threshold Amplitude: 0.75 V
Lead Channel Pacing Threshold Pulse Width: 0.5 ms
Lead Channel Sensing Intrinsic Amplitude: 1.8 mV
Lead Channel Setting Pacing Amplitude: 2 V
Lead Channel Setting Pacing Amplitude: 2.5 V
Lead Channel Setting Pacing Amplitude: 2.75 V
Lead Channel Setting Pacing Pulse Width: 0.5 ms
Lead Channel Setting Pacing Pulse Width: 1 ms
MDC IDC LEAD IMPLANT DT: 20051221
MDC IDC LEAD LOCATION: 753858
MDC IDC MSMT BATTERY REMAINING LONGEVITY: 17 mo
MDC IDC MSMT BATTERY REMAINING PERCENTAGE: 33 %
MDC IDC MSMT LEADCHNL LV IMPEDANCE VALUE: 430 Ohm
MDC IDC MSMT LEADCHNL LV PACING THRESHOLD AMPLITUDE: 1.75 V
MDC IDC MSMT LEADCHNL LV PACING THRESHOLD PULSEWIDTH: 1 ms
MDC IDC MSMT LEADCHNL RV IMPEDANCE VALUE: 380 Ohm
MDC IDC MSMT LEADCHNL RV PACING THRESHOLD AMPLITUDE: 1 V
MDC IDC MSMT LEADCHNL RV PACING THRESHOLD PULSEWIDTH: 0.5 ms
MDC IDC MSMT LEADCHNL RV SENSING INTR AMPL: 12 mV
MDC IDC SESS DTM: 20180815123926
MDC IDC SET LEADCHNL RV SENSING SENSITIVITY: 0.5 mV
MDC IDC STAT BRADY AP VP PERCENT: 97 %
MDC IDC STAT BRADY AS VS PERCENT: 1 %
MDC IDC STAT BRADY RA PERCENT PACED: 98 %
Pulse Gen Serial Number: 7170478

## 2016-11-13 ENCOUNTER — Encounter: Payer: Self-pay | Admitting: Cardiology

## 2016-11-14 ENCOUNTER — Ambulatory Visit: Payer: Medicare Other | Admitting: Internal Medicine

## 2016-11-16 ENCOUNTER — Ambulatory Visit (INDEPENDENT_AMBULATORY_CARE_PROVIDER_SITE_OTHER): Payer: Medicare Other | Admitting: Internal Medicine

## 2016-11-16 ENCOUNTER — Encounter: Payer: Self-pay | Admitting: Internal Medicine

## 2016-11-16 VITALS — BP 120/64 | HR 60 | Temp 97.8°F | Ht 74.0 in | Wt 201.2 lb

## 2016-11-16 DIAGNOSIS — E1122 Type 2 diabetes mellitus with diabetic chronic kidney disease: Secondary | ICD-10-CM

## 2016-11-16 DIAGNOSIS — N183 Chronic kidney disease, stage 3 unspecified: Secondary | ICD-10-CM

## 2016-11-16 DIAGNOSIS — I255 Ischemic cardiomyopathy: Secondary | ICD-10-CM

## 2016-11-16 DIAGNOSIS — Z79899 Other long term (current) drug therapy: Secondary | ICD-10-CM | POA: Diagnosis not present

## 2016-11-16 DIAGNOSIS — I5022 Chronic systolic (congestive) heart failure: Secondary | ICD-10-CM

## 2016-11-16 DIAGNOSIS — E1140 Type 2 diabetes mellitus with diabetic neuropathy, unspecified: Secondary | ICD-10-CM | POA: Diagnosis not present

## 2016-11-16 DIAGNOSIS — Z0000 Encounter for general adult medical examination without abnormal findings: Secondary | ICD-10-CM | POA: Diagnosis not present

## 2016-11-16 DIAGNOSIS — Z794 Long term (current) use of insulin: Secondary | ICD-10-CM

## 2016-11-16 LAB — MICROALBUMIN / CREATININE URINE RATIO
Creatinine,U: 96.2 mg/dL
Microalb Creat Ratio: 0.7 mg/g (ref 0.0–30.0)
Microalb, Ur: 0.7 mg/dL (ref 0.0–1.9)

## 2016-11-16 LAB — POCT GLYCOSYLATED HEMOGLOBIN (HGB A1C): Hemoglobin A1C: 8.6

## 2016-11-16 MED ORDER — INSULIN DEGLUDEC 100 UNIT/ML ~~LOC~~ SOPN: PEN_INJECTOR | SUBCUTANEOUS | 1 refills | Status: DC
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
## 2016-11-16 NOTE — Patient Instructions (Addendum)
I want you to be on the tresiba instead of the levemir .  [name] know why you didn't get the refill we sent in.    Th [name] is a good medicine but  There is a risk of  Low bp and fluid balance issues .  To be monitored.  Consideration instead of a weekly injectable   Such as trulicity or other .   Your A1c test is a lot better but still could get into the 7-8  Range.  I will try to get in touch with your  cardiologist about this. In the meantime I want she did go up to 34 units of the tresiba per day   ROV in 3 months or as needed    [name] , Thank you for taking time to come for your Medicare Wellness Visit. I appreciate your ongoing commitment to your health goals. Please review the following plan we discussed and let me know if I can assist you in the future.   Agreed to microablumin today  Will add a little protein to breakfast unless you are eating cereal with protein   These are the goals we discussed: Goals    . Exercise 150 minutes per week (moderate activity)          May try to walk on your treadmill 2 times a day x 10 minutes  As tolerated        This is a list of the screening recommended for you and due dates:  Health Maintenance  Topic Date Due  . Urine Protein Check  08/13/2012  . Flu Shot  10/17/2016  . Complete foot exam   01/05/2017  . Hemoglobin A1C  02/15/2017  . Eye exam for diabetics  03/14/2017  . Tetanus Vaccine  03/13/2022  . Pneumonia vaccines  Completed   Prevention of falls: Remove rugs or any tripping hazards in the home Use Non slip mats in bathtubs and showers Placing grab bars next to the toilet and or shower Placing handrails on both sides of the stair way Adding extra lighting in the home.   Personal safety issues reviewed:  1. Consider starting a community watch program per [facility] 2.  Changes batteries is smoke detector and/or carbon monoxide detector  3.  If you have firearms; keep them in a safe  place 4.  Wear protection when in the sun; Always wear sunscreen or a hat; It is good to have your doctor check your skin annually or review any new areas of concern 5. Driving safety; Keep in the right lane; stay 3 car lengths behind the car in front of you on the highway; look 3 times prior to pulling out; carry your cell phone everywhere you go!    Learn about the Yellow Dot program:  The program allows first responders at your emergency to have access to who your physician is, as well as your medications and medical conditions.  Citizens requesting the Yellow Dot Packages should contact Master Corporal Nunzio Cobbs at the [facility] [phone] for the first week of the program and beginning the week after Easter citizens should contact their Scientist, physiological.   Falls can be the main reason people lose their independence Think before you CLIMB  4 things you can do to prevent falls 1. Begin an exercise program to improve your leg strength and balance 2. Ask the doctor to review medicines for fall risk 3. Get an annual eye check up  and update your eye glasses;  (the [employer] still assist with eyewear:  Reviewed for annual vision exam;The [facility] assistance for eyewear is coordinated through [facility]; Please call Cherlyn Labella at [phone] 4. Make your home safer by: Removing clutter and tripping hazzards Putting railing on stairs and adding grab bars to the bathroom  Have good lighting; especially on stairs and at night when getting up to the bathroom   Exercise; including walking can assist with maintaining tone and balance and help prevent falls as you age.    Health Maintenance, Male A healthy lifestyle and preventive care is important for your health and wellness. Ask your health care provider about what schedule of regular examinations is right for you. What should I know about weight and diet? Eat a Healthy Diet  Eat plenty of vegetables,  fruits, whole grains, low-fat dairy products, and lean protein.  Do not eat a lot of foods high in solid fats, added sugars, or salt.  Maintain a Healthy Weight Regular exercise can help you achieve or maintain a healthy weight. You should:  Do at least 150 minutes of exercise each week. The exercise should increase your heart rate and make you sweat (moderate-intensity exercise).  Do strength-training exercises at least twice a week.  Watch Your Levels of Cholesterol and Blood Lipids  Have your blood tested for lipids and cholesterol every 5 years starting at 81 years of age. If you are at high risk for heart disease, you should start having your blood tested when you are 81 years old. You may need to have your cholesterol levels checked more often if: ? Your lipid or cholesterol levels are high. ? You are older than 81 years of age. ? You are at high risk for heart disease.  What should I know about cancer screening? Many types of cancers can be detected early and may often be prevented. Lung Cancer  You should be screened every year for lung cancer if: ? You are a current smoker who has smoked for at least 30 years. ? You are a former smoker who has quit within the past 15 years.  Talk to your health care provider about your screening options, when you should start screening, and how often you should be screened.  Colorectal Cancer  Routine colorectal cancer screening usually begins at 81 years of age and should be repeated every 5-10 years until you are 81 years old. You may need to be screened more often if early forms of precancerous polyps or small growths are found. Your health care provider may recommend screening at an earlier age if you have risk factors for colon cancer.  Your health care provider may recommend using home test kits to check for hidden blood in the stool.  A small camera at the end of a tube can be used to examine your colon (sigmoidoscopy or  colonoscopy). This checks for the earliest forms of colorectal cancer.  Prostate and Testicular Cancer  Depending on your age and overall health, your health care provider may do certain tests to screen for prostate and testicular cancer.  Talk to your health care provider about any symptoms or concerns you have about testicular or prostate cancer.  Skin Cancer  Check your skin from head to toe regularly.  Tell your health care provider about any new moles or changes in moles, especially if: ? There is a change in a mole's size, shape, or color. ? You have a mole that is larger than  a pencil eraser.  Always use sunscreen. Apply sunscreen liberally and repeat throughout the day.  Protect yourself by wearing long sleeves, pants, a wide-brimmed hat, and sunglasses when outside.  What should I know about heart disease, diabetes, and high blood pressure?  If you are 62-41 years of age, have your blood pressure checked every 3-5 years. If you are 65 years of age or older, have your blood pressure checked every year. You should have your blood pressure measured twice-once when you are at a hospital or clinic, and once when you are not at a hospital or clinic. Record the average of the two measurements. To check your blood pressure when you are not at a hospital or clinic, you can use: ? An automated blood pressure machine at a pharmacy. ? A home blood pressure monitor.  Talk to your health care provider about your target blood pressure.  If you are between 67-63 years old, ask your health care provider if you should take aspirin to prevent heart disease.  Have regular diabetes screenings by checking your fasting blood sugar level. ? If you are at a normal weight and have a low risk for diabetes, have this test once every three years after the age of 64. ? If you are overweight and have a high risk for diabetes, consider being tested at a younger age or more often.  A one-time screening for  abdominal aortic aneurysm (AAA) by ultrasound is recommended for men aged 38-75 years who are current or former smokers. What should I know about preventing infection? Hepatitis B If you have a higher risk for hepatitis B, you should be screened for this virus. Talk with your health care provider to find out if you are at risk for hepatitis B infection. Hepatitis C Blood testing is recommended for:  Everyone born from [birth year] through 1963.  Anyone with known risk factors for hepatitis C.  Sexually Transmitted Diseases (STDs)  You should be screened each year for STDs including gonorrhea and chlamydia if: ? You are sexually active and are younger than 81 years of age. ? You are older than 81 years of age and your health care provider tells you that you are at risk for this type of infection. ? Your sexual activity has changed since you were last screened and you are at an increased risk for chlamydia or gonorrhea. Ask your health care provider if you are at risk.  Talk with your health care provider about whether you are at high risk of being infected with HIV. Your health care provider may recommend a prescription medicine to help prevent HIV infection.  What else can I do?  Schedule regular health, dental, and eye exams.  Stay current with your vaccines (immunizations).  Do not use any tobacco products, such as cigarettes, chewing tobacco, and e-cigarettes. If you need help quitting, ask your health care provider.  Limit alcohol intake to no more than 2 drinks per day. One drink equals 12 ounces of beer, 5 ounces of wine, or 1 ounces of hard liquor.  Do not use street drugs.  Do not share needles.  Ask your health care provider for help if you need support or information about quitting drugs.  Tell your health care provider if you often feel depressed.  Tell your health care provider if you have ever been abused or do not feel safe at home. This information is not intended to  replace advice given to you by your health care provider. Make  sure you discuss any questions you have with your health care provider. Document Released: 09/01/2007 Document Revised: 11/02/2015 Document Reviewed: 12/07/2014 Elsevier Interactive Patient Education  [employer].

## 2016-11-29 ENCOUNTER — Ambulatory Visit (INDEPENDENT_AMBULATORY_CARE_PROVIDER_SITE_OTHER): Payer: Medicare Other | Admitting: Internal Medicine

## 2016-11-29 ENCOUNTER — Encounter: Payer: Self-pay | Admitting: Internal Medicine

## 2016-11-29 VITALS — BP 110/56 | HR 71 | Temp 98.3°F | Wt 203.7 lb

## 2016-11-29 DIAGNOSIS — I255 Ischemic cardiomyopathy: Secondary | ICD-10-CM

## 2016-11-29 DIAGNOSIS — E1140 Type 2 diabetes mellitus with diabetic neuropathy, unspecified: Secondary | ICD-10-CM | POA: Diagnosis not present

## 2016-11-29 DIAGNOSIS — M25511 Pain in right shoulder: Secondary | ICD-10-CM | POA: Diagnosis not present

## 2016-11-29 DIAGNOSIS — Z79899 Other long term (current) drug therapy: Secondary | ICD-10-CM | POA: Diagnosis not present

## 2016-11-29 DIAGNOSIS — N183 Chronic kidney disease, stage 3 unspecified: Secondary | ICD-10-CM

## 2016-11-29 DIAGNOSIS — Z794 Long term (current) use of insulin: Secondary | ICD-10-CM

## 2016-11-29 MED ORDER — PREDNISONE 20 MG PO TABS: ORAL_TABLET | ORAL | 0 refills | Status: DC
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
## 2016-11-29 NOTE — Patient Instructions (Addendum)
  I believe that your right shoulder pain is either from arthritis, bursitis or some dysfunction of your shoulder rotator cuff. We usually use anti-inflammatories but I do not want she did take the typical ones. We can try 3-5 days of oral prednisone to hoepfulling decrease inflammation and pain    but that will increase your sugar levels temporarily.  So check your blood sugars regularlu   We will refer you to orthopedics as we discussed for further evaluation imaging and treatment  After you finish the prednisone stop the ssaxaglitin   and increase the Tresiba   to 34 units to keep your blood sugar under 200.  We  may consider adding another medication also  For blood sugar control.  As we discussed .   Send in  BG  readings after a month of this plan .  Or if  Not controlled make OV     Shoulder Pain Many things can cause shoulder pain, including:  An injury to the area.  Overuse of the shoulder.  Arthritis.  The source of the pain can be:  Inflammation.  An injury to the shoulder joint.  An injury to a tendon, ligament, or bone.  Follow these instructions at home: Take these actions to help with your pain:  Squeeze a soft ball or a foam pad as much as possible. This helps to keep the shoulder from swelling. It also helps to strengthen the arm.  Take over-the-counter and prescription medicines only as told by your health care provider.  If directed, apply ice to the area: ? Put ice in a plastic bag. ? Place a towel between your skin and the bag. ? Leave the ice on for 20 minutes, 2-3 times per day. Stop applying ice if it does not help with the pain.  If you were given a shoulder sling or immobilizer: ? Wear it as told. ? Remove it to shower or bathe. ? Move your arm as little as possible, but keep your hand moving to prevent swelling.  Contact a health care provider if:  Your pain gets worse.  Your pain is not relieved with medicines.  New pain develops in  your arm, hand, or fingers. Get help right away if:  Your arm, hand, or fingers: ? Tingle. ? Become numb. ? Become swollen. ? Become painful. ? Turn white or blue. This information is not intended to replace advice given to you by your health care provider. Make sure you discuss any questions you have with your health care provider. Document Released: 12/13/2004 Document Revised: 10/30/2015 Document Reviewed: 06/28/2014 Elsevier Interactive Patient Education  2017 [employer].

## 2016-12-10 ENCOUNTER — Ambulatory Visit (INDEPENDENT_AMBULATORY_CARE_PROVIDER_SITE_OTHER): Payer: Medicare Other | Admitting: Orthopaedic Surgery

## 2016-12-10 ENCOUNTER — Ambulatory Visit (INDEPENDENT_AMBULATORY_CARE_PROVIDER_SITE_OTHER): Payer: Medicare Other

## 2016-12-10 DIAGNOSIS — G8929 Other chronic pain: Secondary | ICD-10-CM

## 2016-12-10 DIAGNOSIS — M19019 Primary osteoarthritis, unspecified shoulder: Secondary | ICD-10-CM

## 2016-12-10 DIAGNOSIS — M25511 Pain in right shoulder: Secondary | ICD-10-CM

## 2016-12-10 DIAGNOSIS — I255 Ischemic cardiomyopathy: Secondary | ICD-10-CM

## 2016-12-10 MED ORDER — LIDOCAINE HCL 1 % IJ SOLN
3.0000 mL | INTRAMUSCULAR | Status: AC | PRN
  Administered 2016-12-10: 3 mL

## 2016-12-10 MED ORDER — METHYLPREDNISOLONE ACETATE 40 MG/ML IJ SUSP
40.0000 mg | INTRAMUSCULAR | Status: AC | PRN
  Administered 2016-12-10: 40 mg via INTRA_ARTICULAR
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
## 2016-12-11 ENCOUNTER — Other Ambulatory Visit: Payer: Self-pay | Admitting: Internal Medicine

## 2016-12-17 NOTE — Telephone Encounter (Signed)
Medication filled to pharmacy as requested.   

## 2016-12-27 ENCOUNTER — Other Ambulatory Visit (HOSPITAL_COMMUNITY): Payer: Self-pay | Admitting: Internal Medicine

## 2017-01-02 DIAGNOSIS — C4442 Squamous cell carcinoma of skin of scalp and neck: Secondary | ICD-10-CM | POA: Diagnosis not present

## 2017-01-07 ENCOUNTER — Other Ambulatory Visit: Payer: Self-pay | Admitting: *Deleted

## 2017-01-07 DIAGNOSIS — I6523 Occlusion and stenosis of bilateral carotid arteries: Secondary | ICD-10-CM
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
## 2017-01-08 ENCOUNTER — Ambulatory Visit (INDEPENDENT_AMBULATORY_CARE_PROVIDER_SITE_OTHER)
Admission: RE | Admit: 2017-01-08 | Discharge: 2017-01-08 | Disposition: A | Discharge status: Home / Self Care | Payer: Medicare Other | Source: Ambulatory Visit | Attending: Internal Medicine | Admitting: Internal Medicine

## 2017-01-08 ENCOUNTER — Encounter: Payer: Self-pay | Admitting: Internal Medicine

## 2017-01-08 ENCOUNTER — Ambulatory Visit (INDEPENDENT_AMBULATORY_CARE_PROVIDER_SITE_OTHER): Payer: Medicare Other | Admitting: Internal Medicine

## 2017-01-08 VITALS — BP 122/62 | HR 60 | Temp 97.8°F | Wt 202.8 lb

## 2017-01-08 DIAGNOSIS — Z79899 Other long term (current) drug therapy: Secondary | ICD-10-CM

## 2017-01-08 DIAGNOSIS — Z794 Long term (current) use of insulin: Secondary | ICD-10-CM | POA: Diagnosis not present

## 2017-01-08 DIAGNOSIS — M25571 Pain in right ankle and joints of right foot: Secondary | ICD-10-CM

## 2017-01-08 DIAGNOSIS — I255 Ischemic cardiomyopathy: Secondary | ICD-10-CM | POA: Diagnosis not present

## 2017-01-08 DIAGNOSIS — M25471 Effusion, right ankle: Secondary | ICD-10-CM

## 2017-01-08 DIAGNOSIS — Z23 Encounter for immunization: Secondary | ICD-10-CM

## 2017-01-08 DIAGNOSIS — N289 Disorder of kidney and ureter, unspecified: Secondary | ICD-10-CM | POA: Diagnosis not present

## 2017-01-08 DIAGNOSIS — E1140 Type 2 diabetes mellitus with diabetic neuropathy, unspecified: Secondary | ICD-10-CM

## 2017-01-08 NOTE — Patient Instructions (Addendum)
Get x ray today at [facility] .    X ray today   Of ankle   Checking bones   And joint space. This could be arthritis  . May need to see   The orthopedics Dr Adela Ports office    If ongoing  . Consideration of   Ankle support sometimes helps pain.   Gout is less likely     Will be contacted to day about   X ray result and plan  Stop the  [employer] tresiba   To 36 units and kee fu appt

## 2017-01-09 ENCOUNTER — Encounter: Payer: Self-pay | Admitting: Podiatry

## 2017-01-09 ENCOUNTER — Ambulatory Visit (INDEPENDENT_AMBULATORY_CARE_PROVIDER_SITE_OTHER): Payer: Medicare Other | Admitting: Podiatry

## 2017-01-09 DIAGNOSIS — B351 Tinea unguium: Secondary | ICD-10-CM | POA: Diagnosis not present

## 2017-01-09 DIAGNOSIS — Q828 Other specified congenital malformations of skin: Secondary | ICD-10-CM | POA: Diagnosis not present

## 2017-01-09 DIAGNOSIS — M79676 Pain in unspecified toe(s): Secondary | ICD-10-CM

## 2017-01-09 DIAGNOSIS — E1140 Type 2 diabetes mellitus with diabetic neuropathy, unspecified: Secondary | ICD-10-CM

## 2017-01-09 NOTE — Progress Notes (Signed)
Patient ID: [name], male   DOB: 09-29-1928, 83 y.o.   MRN: [id number] Complaint:  Visit Type: Patient returns to my office for continued preventative foot care services. Complaint: Patient states" my nails have grown long and thick and become painful to walk and wear shoes" Patient has been diagnosed with DM with neuropathy.[name] He presents for preventative foot care services. No changes to ROS.  He also has painful callus under the outside ball of both feet.  Podiatric Exam: Vascular: dorsalis pedis and posterior tibial pulses are palpable bilateral. Capillary return is immediate. Temperature gradient is WNL. Skin turgor WNL  Sensorium: Diminished  Semmes Weinstein monofilament test. Normal tactile sensation bilaterally. Nail Exam: Pt has thick disfigured discolored nails with subungual debris noted bilateral entire nail hallux through fifth toenails Ulcer Exam: There is no evidence of ulcer or pre-ulcerative changes or infection. Orthopedic Exam: Muscle tone and strength are WNL. No limitations in general ROM. No crepitus or effusions noted. Foot type and digits show no abnormalities. Bony prominences are unremarkable. Asymptomatic HAV B/L. Skin:  Porokeratosis sub 5th metatarsal bilateral.. No infection or ulcers.  Symptomatic Callus both feet. Clavi 3rd toe right foot.  Diagnosis:  Tinea unguium, Pain in right toe, pain in left toes, Porokeratosis  Treatment & Plan Procedures and Treatment: Consent by patient was obtained for treatment procedures. The patient understood the discussion of treatment and procedures well. All questions were answered thoroughly reviewed. Debridement of mycotic and hypertrophic toenails, 1 through 5 bilateral and clearing of subungual debris. No ulceration, no infection noted.  Debride porokeratosis.  Iatrogenic lesion 3rd toe right.  Bandaged and cauterized. Return Visit-Office Procedure: Patient instructed to return to the office for a follow up visit 10 weeks   for continued evaluation and treatment.   Gardiner Barefoot DPM

## 2017-01-15 DIAGNOSIS — H9202 Otalgia, left ear: Secondary | ICD-10-CM | POA: Insufficient documentation

## 2017-01-15 DIAGNOSIS — H903 Sensorineural hearing loss, bilateral: Secondary | ICD-10-CM | POA: Diagnosis not present

## 2017-01-15 DIAGNOSIS — H60332 Swimmer's ear, left ear: Secondary | ICD-10-CM | POA: Diagnosis not present

## 2017-01-18 DIAGNOSIS — H903 Sensorineural hearing loss, bilateral: Secondary | ICD-10-CM | POA: Diagnosis not present

## 2017-01-23 ENCOUNTER — Other Ambulatory Visit (HOSPITAL_COMMUNITY): Payer: Self-pay | Admitting: *Deleted

## 2017-01-23 MED ORDER — TORSEMIDE 20 MG PO TABS: 60.0000 mg | ORAL_TABLET | Freq: Every day | ORAL | 3 refills | Status: DC

## 2017-01-23 MED ORDER — HYDRALAZINE HCL 25 MG PO TABS: 25.0000 mg | ORAL_TABLET | Freq: Three times a day (TID) | ORAL | 3 refills | Status: DC

## 2017-01-23 MED ORDER — ISOSORBIDE MONONITRATE ER 30 MG PO TB24: 30.0000 mg | ORAL_TABLET | Freq: Every day | ORAL | 3 refills | Status: DC

## 2017-01-25 ENCOUNTER — Other Ambulatory Visit (HOSPITAL_COMMUNITY): Payer: Self-pay | Admitting: *Deleted

## 2017-01-25 MED ORDER — DIGOXIN 125 MCG PO TABS: ORAL_TABLET | ORAL | 3 refills | Status: DC

## 2017-01-30 ENCOUNTER — Ambulatory Visit (INDEPENDENT_AMBULATORY_CARE_PROVIDER_SITE_OTHER): Payer: Medicare Other | Admitting: *Deleted

## 2017-01-30 DIAGNOSIS — I255 Ischemic cardiomyopathy: Secondary | ICD-10-CM | POA: Diagnosis not present

## 2017-01-30 NOTE — Progress Notes (Signed)
Remote ICD transmission.   

## 2017-01-31 LAB — CUP PACEART REMOTE DEVICE CHECK
Battery Remaining Longevity: 16 mo
Battery Remaining Percentage: 29 %
Brady Statistic AS VS Percent: 1 %
HIGH POWER IMPEDANCE MEASURED VALUE: 45 Ohm
HIGH POWER IMPEDANCE MEASURED VALUE: 45 Ohm
Implantable Lead Implant Date: 20051221
Implantable Lead Implant Date: 20150304
Implantable Lead Location: 753858
Implantable Lead Location: 753859
Implantable Lead Location: 753860
Implantable Lead Model: 1581
Implantable Pulse Generator Implant Date: 20150304
Lead Channel Impedance Value: 380 Ohm
Lead Channel Pacing Threshold Pulse Width: 0.5 ms
Lead Channel Pacing Threshold Pulse Width: 1 ms
Lead Channel Sensing Intrinsic Amplitude: 2.1 mV
Lead Channel Setting Pacing Amplitude: 2.875
Lead Channel Setting Pacing Pulse Width: 0.5 ms
Lead Channel Setting Pacing Pulse Width: 1 ms
Lead Channel Setting Sensing Sensitivity: 0.5 mV
MDC IDC LEAD IMPLANT DT: 20150304
MDC IDC MSMT BATTERY VOLTAGE: 2.86 V
MDC IDC MSMT LEADCHNL LV IMPEDANCE VALUE: 440 Ohm
MDC IDC MSMT LEADCHNL LV PACING THRESHOLD AMPLITUDE: 1.875 V
MDC IDC MSMT LEADCHNL RA IMPEDANCE VALUE: 380 Ohm
MDC IDC MSMT LEADCHNL RA PACING THRESHOLD AMPLITUDE: 0.75 V
MDC IDC MSMT LEADCHNL RV PACING THRESHOLD AMPLITUDE: 1 V
MDC IDC MSMT LEADCHNL RV PACING THRESHOLD PULSEWIDTH: 0.5 ms
MDC IDC MSMT LEADCHNL RV SENSING INTR AMPL: 12 mV
MDC IDC SESS DTM: 20181114090018
MDC IDC SET LEADCHNL RA PACING AMPLITUDE: 2 V
MDC IDC SET LEADCHNL RV PACING AMPLITUDE: 2.5 V
MDC IDC STAT BRADY AP VP PERCENT: 97 %
MDC IDC STAT BRADY AP VS PERCENT: 1 %
MDC IDC STAT BRADY AS VP PERCENT: 2.1 %
MDC IDC STAT BRADY RA PERCENT PACED: 97 %
Pulse Gen Serial Number: 7170478

## 2017-02-01 ENCOUNTER — Encounter: Payer: Self-pay | Admitting: Cardiology

## 2017-02-09 ENCOUNTER — Other Ambulatory Visit: Payer: Self-pay | Admitting: Internal Medicine

## 2017-02-12 DIAGNOSIS — L570 Actinic keratosis: Secondary | ICD-10-CM | POA: Diagnosis not present

## 2017-02-12 DIAGNOSIS — Z85828 Personal history of other malignant neoplasm of skin: Secondary | ICD-10-CM | POA: Diagnosis not present

## 2017-02-12 DIAGNOSIS — L905 Scar conditions and fibrosis of skin: Secondary | ICD-10-CM | POA: Diagnosis not present

## 2017-02-15 ENCOUNTER — Encounter: Payer: Self-pay | Admitting: Internal Medicine

## 2017-02-15 ENCOUNTER — Ambulatory Visit (INDEPENDENT_AMBULATORY_CARE_PROVIDER_SITE_OTHER): Payer: Medicare Other | Admitting: Internal Medicine

## 2017-02-15 VITALS — BP 112/62 | HR 65 | Temp 97.6°F | Wt 199.4 lb

## 2017-02-15 DIAGNOSIS — Z789 Other specified health status: Secondary | ICD-10-CM

## 2017-02-15 DIAGNOSIS — M25511 Pain in right shoulder: Secondary | ICD-10-CM

## 2017-02-15 DIAGNOSIS — E1140 Type 2 diabetes mellitus with diabetic neuropathy, unspecified: Secondary | ICD-10-CM

## 2017-02-15 DIAGNOSIS — I255 Ischemic cardiomyopathy: Secondary | ICD-10-CM

## 2017-02-15 DIAGNOSIS — Z7901 Long term (current) use of anticoagulants: Secondary | ICD-10-CM

## 2017-02-15 DIAGNOSIS — N183 Chronic kidney disease, stage 3 unspecified: Secondary | ICD-10-CM

## 2017-02-15 DIAGNOSIS — Z79899 Other long term (current) drug therapy: Secondary | ICD-10-CM | POA: Diagnosis not present

## 2017-02-15 DIAGNOSIS — Z794 Long term (current) use of insulin: Secondary | ICD-10-CM | POA: Diagnosis not present

## 2017-02-15 DIAGNOSIS — M25571 Pain in right ankle and joints of right foot: Secondary | ICD-10-CM

## 2017-02-15 MED ORDER — SEMAGLUTIDE(0.25 OR 0.5MG/DOS) 2 MG/1.5ML ~~LOC~~ SOPN: 0.2500 mg | PEN_INJECTOR | SUBCUTANEOUS | 3 refills | Status: DC

## 2017-02-15 MED ORDER — GLUCOSE BLOOD VI STRP: ORAL_STRIP | 3 refills | Status: DC
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
## 2017-02-15 NOTE — Patient Instructions (Addendum)
Stay on tresiba    Insulin  Add ozempic   As instructed once a week  Can  Make you less hungry    And nausea  That goes away with time   If you are getting  Low blood sugar  .[name]  Goal would be to decrease the  Glipizide and  Just be on the injectables. For the diabetes .  ROV in 6 -8 weeks  Or as needed

## 2017-02-19 ENCOUNTER — Telehealth: Payer: Self-pay | Admitting: Pulmonary Disease

## 2017-02-19 DIAGNOSIS — G4733 Obstructive sleep apnea (adult) (pediatric): Secondary | ICD-10-CM

## 2017-02-19 NOTE — Telephone Encounter (Signed)
Spoke with pt's wife who states that pt uses [facility] off [address] in [place] as the DME company.  Looked at pt's snapshot information and saw where it said that pt's DME was [facility].  Called [facility] to see if pt was a client there and left a message for them to call us back.

## 2017-02-19 NOTE — Telephone Encounter (Signed)
Sarah with [facility] returned call.  States patient was client with them until 2016 when they lost Medicare contract.  States she believes patient then went with [facility]. Last Cpap was in 2011.  If need to call back, CB [phone].

## 2017-02-20 NOTE — Telephone Encounter (Signed)
Spoke with pt, he states he received his supplies from [facility] so I will send the order to them. PCC's I meant to put [facility] in [place] on [address].

## 2017-02-20 NOTE — Telephone Encounter (Signed)
Order sent to [facility] for supplies.  Nothing further needed.

## 2017-02-20 NOTE — Telephone Encounter (Signed)
LMTCB for the pt 

## 2017-03-11 ENCOUNTER — Other Ambulatory Visit: Payer: Self-pay | Admitting: Internal Medicine

## 2017-03-20 ENCOUNTER — Ambulatory Visit (INDEPENDENT_AMBULATORY_CARE_PROVIDER_SITE_OTHER): Payer: Medicare Other | Admitting: Podiatry

## 2017-03-20 ENCOUNTER — Encounter: Payer: Self-pay | Admitting: Podiatry

## 2017-03-20 DIAGNOSIS — E1140 Type 2 diabetes mellitus with diabetic neuropathy, unspecified: Secondary | ICD-10-CM

## 2017-03-20 DIAGNOSIS — M79676 Pain in unspecified toe(s): Secondary | ICD-10-CM | POA: Diagnosis not present

## 2017-03-20 DIAGNOSIS — B351 Tinea unguium: Secondary | ICD-10-CM | POA: Diagnosis not present

## 2017-03-20 NOTE — Progress Notes (Addendum)
Patient ID: [name], male   DOB: 1928-12-16, 84 y.o.   MRN: [id number] Complaint:  Visit Type: Patient returns to my office for continued preventative foot care services. Complaint: Patient states" my nails have grown long and thick and become painful to walk and wear shoes" Patient has been diagnosed with DM with neuropathy.[name] He presents for preventative foot care services. No changes to ROS.  He also has painful callus under the outside ball of both feet.  Podiatric Exam: Vascular: dorsalis pedis and posterior tibial pulses are palpable bilateral. Capillary return is immediate. Temperature gradient is WNL. Skin turgor WNL  Sensorium: Diminished  Semmes Weinstein monofilament test. Normal tactile sensation bilaterally. Nail Exam: Pt has thick disfigured discolored nails with subungual debris noted bilateral entire nail hallux through fifth toenails Ulcer Exam: There is no evidence of ulcer or pre-ulcerative changes or infection. Orthopedic Exam: Muscle tone and strength are WNL. No limitations in general ROM. No crepitus or effusions noted. Foot type and digits show no abnormalities. Bony prominences are unremarkable. Asymptomatic HAV B/L. Skin:  Porokeratosis sub 5th metatarsal bilateral.. No infection or ulcers.  Asymptomatic Callus both feet. Clavi 3rd toe right foot.  Diagnosis:  Tinea unguium, Pain in right toe, pain in left toes   Treatment & Plan Procedures and Treatment: Consent by patient was obtained for treatment procedures. The patient understood the discussion of treatment and procedures well. All questions were answered thoroughly reviewed. Debridement of mycotic and hypertrophic toenails, 1 through 5 bilateral and clearing of subungual debris. No ulceration, no infection noted.  ABN signed for 2019. Return Visit-Office Procedure: Patient instructed to return to the office for a follow up visit 10 weeks  for continued evaluation and treatment.   Gardiner Barefoot DPM

## 2017-03-23 ENCOUNTER — Other Ambulatory Visit: Payer: Self-pay | Admitting: Internal Medicine

## 2017-04-02 ENCOUNTER — Other Ambulatory Visit: Payer: Self-pay | Admitting: Internal Medicine
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
## 2017-04-05 ENCOUNTER — Ambulatory Visit (INDEPENDENT_AMBULATORY_CARE_PROVIDER_SITE_OTHER): Payer: Medicare Other | Admitting: Internal Medicine

## 2017-04-05 ENCOUNTER — Encounter: Payer: Self-pay | Admitting: Internal Medicine

## 2017-04-05 VITALS — BP 138/62 | HR 65 | Temp 98.1°F | Wt 199.9 lb

## 2017-04-05 DIAGNOSIS — Z789 Other specified health status: Secondary | ICD-10-CM | POA: Diagnosis not present

## 2017-04-05 DIAGNOSIS — E1140 Type 2 diabetes mellitus with diabetic neuropathy, unspecified: Secondary | ICD-10-CM

## 2017-04-05 DIAGNOSIS — Z79899 Other long term (current) drug therapy: Secondary | ICD-10-CM | POA: Diagnosis not present

## 2017-04-05 DIAGNOSIS — Z794 Long term (current) use of insulin: Secondary | ICD-10-CM

## 2017-04-05 DIAGNOSIS — N289 Disorder of kidney and ureter, unspecified: Secondary | ICD-10-CM

## 2017-04-05 LAB — POCT GLYCOSYLATED HEMOGLOBIN (HGB A1C): HEMOGLOBIN A1C: 8.4

## 2017-04-05 MED ORDER — SEMAGLUTIDE(0.25 OR 0.5MG/DOS) 2 MG/1.5ML ~~LOC~~ SOPN: 0.5000 mg | PEN_INJECTOR | SUBCUTANEOUS | 3 refills | Status: DC

## 2017-04-05 MED ORDER — INSULIN DEGLUDEC 100 UNIT/ML ~~LOC~~ SOPN: PEN_INJECTOR | SUBCUTANEOUS | 0 refills | Status: DC

## 2017-04-05 NOTE — Patient Instructions (Signed)
Take 0.5  Per week for ozempic   ( say on this dose for now)   Increase tresiba  From 35 to  38 units    Plan rov in 3 months but  If  Too low of very high BG then come in   Earlier.

## 2017-04-19 ENCOUNTER — Other Ambulatory Visit (HOSPITAL_COMMUNITY): Payer: Self-pay | Admitting: Internal Medicine

## 2017-04-23 ENCOUNTER — Ambulatory Visit (HOSPITAL_COMMUNITY)
Admission: RE | Admit: 2017-04-23 | Discharge: 2017-04-23 | Disposition: A | Discharge status: Home / Self Care | Payer: Medicare Other | Source: Ambulatory Visit | Attending: Cardiology | Admitting: Cardiology

## 2017-04-23 DIAGNOSIS — Z87891 Personal history of nicotine dependence: Secondary | ICD-10-CM | POA: Insufficient documentation

## 2017-04-23 DIAGNOSIS — E119 Type 2 diabetes mellitus without complications: Secondary | ICD-10-CM | POA: Insufficient documentation

## 2017-04-23 DIAGNOSIS — Z794 Long term (current) use of insulin: Secondary | ICD-10-CM | POA: Diagnosis not present

## 2017-04-23 DIAGNOSIS — E785 Hyperlipidemia, unspecified: Secondary | ICD-10-CM | POA: Diagnosis not present

## 2017-04-23 DIAGNOSIS — I6523 Occlusion and stenosis of bilateral carotid arteries: Secondary | ICD-10-CM | POA: Insufficient documentation

## 2017-05-01 ENCOUNTER — Ambulatory Visit (INDEPENDENT_AMBULATORY_CARE_PROVIDER_SITE_OTHER): Payer: Medicare Other | Admitting: *Deleted

## 2017-05-01 DIAGNOSIS — I255 Ischemic cardiomyopathy: Secondary | ICD-10-CM | POA: Diagnosis not present

## 2017-05-01 NOTE — Progress Notes (Signed)
Remote ICD transmission.   

## 2017-05-02 ENCOUNTER — Encounter: Payer: Self-pay | Admitting: Cardiology

## 2017-05-15 DIAGNOSIS — Z85828 Personal history of other malignant neoplasm of skin: Secondary | ICD-10-CM | POA: Diagnosis not present

## 2017-05-15 DIAGNOSIS — L820 Inflamed seborrheic keratosis: Secondary | ICD-10-CM | POA: Diagnosis not present

## 2017-05-15 DIAGNOSIS — L570 Actinic keratosis: Secondary | ICD-10-CM | POA: Diagnosis not present

## 2017-05-15 LAB — CUP PACEART REMOTE DEVICE CHECK
Battery Remaining Longevity: 14 mo
Battery Remaining Percentage: 28 %
Brady Statistic AS VS Percent: 1 %
Brady Statistic RA Percent Paced: 97 %
HIGH POWER IMPEDANCE MEASURED VALUE: 45 Ohm
HIGH POWER IMPEDANCE MEASURED VALUE: 45 Ohm
Implantable Lead Implant Date: 20051221
Implantable Lead Implant Date: 20150304
Implantable Lead Location: 753858
Implantable Lead Location: 753860
Implantable Lead Model: 1581
Implantable Pulse Generator Implant Date: 20150304
Lead Channel Impedance Value: 360 Ohm
Lead Channel Impedance Value: 440 Ohm
Lead Channel Pacing Threshold Amplitude: 1 V
Lead Channel Pacing Threshold Amplitude: 1.875 V
Lead Channel Pacing Threshold Pulse Width: 0.5 ms
Lead Channel Setting Pacing Amplitude: 2.875
Lead Channel Setting Pacing Pulse Width: 1 ms
Lead Channel Setting Sensing Sensitivity: 0.5 mV
MDC IDC LEAD IMPLANT DT: 20150304
MDC IDC LEAD LOCATION: 753859
MDC IDC MSMT BATTERY VOLTAGE: 2.84 V
MDC IDC MSMT LEADCHNL LV PACING THRESHOLD PULSEWIDTH: 1 ms
MDC IDC MSMT LEADCHNL RA IMPEDANCE VALUE: 380 Ohm
MDC IDC MSMT LEADCHNL RA PACING THRESHOLD AMPLITUDE: 0.75 V
MDC IDC MSMT LEADCHNL RA SENSING INTR AMPL: 2.1 mV
MDC IDC MSMT LEADCHNL RV PACING THRESHOLD PULSEWIDTH: 0.5 ms
MDC IDC MSMT LEADCHNL RV SENSING INTR AMPL: 12 mV
MDC IDC SESS DTM: 20190213090016
MDC IDC SET LEADCHNL RA PACING AMPLITUDE: 2 V
MDC IDC SET LEADCHNL RV PACING AMPLITUDE: 2.5 V
MDC IDC SET LEADCHNL RV PACING PULSEWIDTH: 0.5 ms
MDC IDC STAT BRADY AP VP PERCENT: 97 %
MDC IDC STAT BRADY AP VS PERCENT: 1 %
MDC IDC STAT BRADY AS VP PERCENT: 2.4 %
Pulse Gen Serial Number: 7170478

## 2017-05-20 ENCOUNTER — Other Ambulatory Visit (HOSPITAL_COMMUNITY): Payer: Self-pay | Admitting: *Deleted

## 2017-05-20 MED ORDER — DIGOXIN 125 MCG PO TABS: ORAL_TABLET | ORAL | 3 refills | Status: DC

## 2017-05-23 ENCOUNTER — Other Ambulatory Visit: Payer: Self-pay

## 2017-05-23 DIAGNOSIS — L570 Actinic keratosis: Secondary | ICD-10-CM | POA: Diagnosis not present

## 2017-05-23 DIAGNOSIS — D485 Neoplasm of uncertain behavior of skin: Secondary | ICD-10-CM | POA: Diagnosis not present

## 2017-05-23 DIAGNOSIS — L989 Disorder of the skin and subcutaneous tissue, unspecified: Secondary | ICD-10-CM | POA: Diagnosis not present

## 2017-05-24 ENCOUNTER — Other Ambulatory Visit: Payer: Self-pay | Admitting: Internal Medicine

## 2017-06-19 ENCOUNTER — Ambulatory Visit (INDEPENDENT_AMBULATORY_CARE_PROVIDER_SITE_OTHER): Payer: Medicare Other | Admitting: Podiatry

## 2017-06-19 ENCOUNTER — Encounter: Payer: Self-pay | Admitting: Podiatry

## 2017-06-19 DIAGNOSIS — E1140 Type 2 diabetes mellitus with diabetic neuropathy, unspecified: Secondary | ICD-10-CM | POA: Diagnosis not present

## 2017-06-19 DIAGNOSIS — D689 Coagulation defect, unspecified: Secondary | ICD-10-CM | POA: Diagnosis not present

## 2017-06-19 DIAGNOSIS — B351 Tinea unguium: Secondary | ICD-10-CM | POA: Diagnosis not present

## 2017-06-19 DIAGNOSIS — M79676 Pain in unspecified toe(s): Secondary | ICD-10-CM

## 2017-06-19 NOTE — Progress Notes (Signed)
Patient ID: ZAIVION KUNDRAT, male   DOB: May 28, 1928, 84 y.o.   MRN: [id number] Complaint:  Visit Type: Patient returns to my office for continued preventative foot care services. Complaint: Patient states" my nails have grown long and thick and become painful to walk and wear shoes" Patient has been diagnosed with DM with neuropathy.[name] He presents for preventative foot care services. No changes to ROS.  He also has painful callus under the outside ball of both feet.  Podiatric Exam: Vascular: dorsalis pedis and posterior tibial pulses are palpable bilateral. Capillary return is immediate. Temperature gradient is WNL. Skin turgor WNL  Sensorium: Diminished  Semmes Weinstein monofilament test. Normal tactile sensation bilaterally. Nail Exam: Pt has thick disfigured discolored nails with subungual debris noted bilateral entire nail hallux through fifth toenails Ulcer Exam: There is no evidence of ulcer or pre-ulcerative changes or infection. Orthopedic Exam: Muscle tone and strength are WNL. No limitations in general ROM. No crepitus or effusions noted. Foot type and digits show no abnormalities. Bony prominences are unremarkable. Asymptomatic HAV B/L. Skin:  Porokeratosis sub 5th metatarsal bilateral.. No infection or ulcers.  Asymptomatic Callus both feet.   Diagnosis:  Tinea unguium, Pain in right toe, pain in left toes   Treatment & Plan Procedures and Treatment: Consent by patient was obtained for treatment procedures. The patient understood the discussion of treatment and procedures well. All questions were answered thoroughly reviewed. Debridement of mycotic and hypertrophic toenails, 1 through 5 bilateral and clearing of subungual debris. No ulceration, no infection noted.  ABN signed for 2019. Return Visit-Office Procedure: Patient instructed to return to the office for a follow up visit 10 weeks  for continued evaluation and treatment.   Gardiner Barefoot DPM

## 2017-06-25 DIAGNOSIS — L905 Scar conditions and fibrosis of skin: Secondary | ICD-10-CM | POA: Diagnosis not present

## 2017-06-25 DIAGNOSIS — L570 Actinic keratosis: Secondary | ICD-10-CM | POA: Diagnosis not present

## 2017-06-25 DIAGNOSIS — Z85828 Personal history of other malignant neoplasm of skin: Secondary | ICD-10-CM | POA: Diagnosis not present
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
## 2017-07-02 ENCOUNTER — Ambulatory Visit (INDEPENDENT_AMBULATORY_CARE_PROVIDER_SITE_OTHER): Payer: Medicare Other | Admitting: Internal Medicine

## 2017-07-02 ENCOUNTER — Encounter: Payer: Self-pay | Admitting: Internal Medicine

## 2017-07-02 VITALS — BP 120/60 | HR 63 | Temp 97.5°F | Wt 195.8 lb

## 2017-07-02 DIAGNOSIS — Z79899 Other long term (current) drug therapy: Secondary | ICD-10-CM | POA: Diagnosis not present

## 2017-07-02 DIAGNOSIS — N183 Chronic kidney disease, stage 3 unspecified: Secondary | ICD-10-CM

## 2017-07-02 DIAGNOSIS — I5022 Chronic systolic (congestive) heart failure: Secondary | ICD-10-CM | POA: Diagnosis not present

## 2017-07-02 DIAGNOSIS — E1140 Type 2 diabetes mellitus with diabetic neuropathy, unspecified: Secondary | ICD-10-CM | POA: Diagnosis not present

## 2017-07-02 DIAGNOSIS — Z794 Long term (current) use of insulin: Secondary | ICD-10-CM | POA: Diagnosis not present

## 2017-07-02 DIAGNOSIS — D649 Anemia, unspecified: Secondary | ICD-10-CM | POA: Diagnosis not present

## 2017-07-02 DIAGNOSIS — I255 Ischemic cardiomyopathy: Secondary | ICD-10-CM | POA: Diagnosis not present

## 2017-07-02 DIAGNOSIS — Z789 Other specified health status: Secondary | ICD-10-CM

## 2017-07-02 LAB — POCT GLYCOSYLATED HEMOGLOBIN (HGB A1C): HEMOGLOBIN A1C: 7.4

## 2017-07-02 NOTE — Patient Instructions (Addendum)
Your blood sugars are much better   Stay on  ozempic 0.5 weekly    Will send   In rx    Due for    Blood  Work and Ov   End of  July   I will put in order for labs for end of July ( 3 mos and then OV )

## 2017-07-04 ENCOUNTER — Ambulatory Visit: Payer: Medicare Other | Admitting: Internal Medicine

## 2017-07-05 ENCOUNTER — Other Ambulatory Visit: Payer: Self-pay | Admitting: Cardiology

## 2017-07-05 DIAGNOSIS — I2510 Atherosclerotic heart disease of native coronary artery without angina pectoris: Secondary | ICD-10-CM

## 2017-07-21 ENCOUNTER — Other Ambulatory Visit: Payer: Self-pay | Admitting: Internal Medicine

## 2017-07-24 ENCOUNTER — Other Ambulatory Visit: Payer: Self-pay | Admitting: Internal Medicine

## 2017-07-31 ENCOUNTER — Ambulatory Visit (INDEPENDENT_AMBULATORY_CARE_PROVIDER_SITE_OTHER): Payer: Medicare Other | Admitting: *Deleted

## 2017-07-31 DIAGNOSIS — I255 Ischemic cardiomyopathy: Secondary | ICD-10-CM | POA: Diagnosis not present

## 2017-08-01 NOTE — Progress Notes (Signed)
Remote ICD transmission.   

## 2017-08-02 ENCOUNTER — Encounter: Payer: Self-pay | Admitting: Cardiology

## 2017-08-06 ENCOUNTER — Encounter: Payer: Self-pay | Admitting: Internal Medicine

## 2017-08-06 ENCOUNTER — Ambulatory Visit (INDEPENDENT_AMBULATORY_CARE_PROVIDER_SITE_OTHER): Payer: Medicare Other | Admitting: Internal Medicine

## 2017-08-06 VITALS — BP 110/56 | HR 72 | Ht 74.0 in | Wt 193.0 lb

## 2017-08-06 DIAGNOSIS — Z95810 Presence of automatic (implantable) cardiac defibrillator: Secondary | ICD-10-CM | POA: Diagnosis not present

## 2017-08-06 DIAGNOSIS — I482 Chronic atrial fibrillation, unspecified: Secondary | ICD-10-CM

## 2017-08-06 DIAGNOSIS — I5022 Chronic systolic (congestive) heart failure: Secondary | ICD-10-CM

## 2017-08-06 DIAGNOSIS — I255 Ischemic cardiomyopathy: Secondary | ICD-10-CM

## 2017-08-06 LAB — CUP PACEART INCLINIC DEVICE CHECK
HIGH POWER IMPEDANCE MEASURED VALUE: 41 Ohm
HighPow Impedance: 41.8618
HighPow Impedance: 47 Ohm
Implantable Lead Implant Date: 20051221
Implantable Lead Implant Date: 20150304
Implantable Lead Implant Date: 20150304
Implantable Lead Location: 753858
Implantable Lead Location: 753860
Implantable Pulse Generator Implant Date: 20150304
Lead Channel Impedance Value: 350 Ohm
Lead Channel Impedance Value: 362.5 Ohm
Lead Channel Impedance Value: 380 Ohm
Lead Channel Impedance Value: 380 Ohm
Lead Channel Impedance Value: 430 Ohm
Lead Channel Pacing Threshold Amplitude: 0.75 V
Lead Channel Pacing Threshold Amplitude: 0.75 V
Lead Channel Pacing Threshold Amplitude: 1 V
Lead Channel Pacing Threshold Amplitude: 1.25 V
Lead Channel Pacing Threshold Amplitude: 2.25 V
Lead Channel Pacing Threshold Amplitude: 2.5 V
Lead Channel Pacing Threshold Pulse Width: 0.5 ms
Lead Channel Pacing Threshold Pulse Width: 0.5 ms
Lead Channel Pacing Threshold Pulse Width: 0.5 ms
Lead Channel Pacing Threshold Pulse Width: 1 ms
Lead Channel Pacing Threshold Pulse Width: 1 ms
Lead Channel Sensing Intrinsic Amplitude: 2.1 mV
Lead Channel Setting Pacing Amplitude: 2 V
Lead Channel Setting Pacing Pulse Width: 0.5 ms
Lead Channel Setting Pacing Pulse Width: 1 ms
Lead Channel Setting Sensing Sensitivity: 0.5 mV
MDC IDC LEAD LOCATION: 753859
MDC IDC MSMT BATTERY REMAINING LONGEVITY: 13 mo
MDC IDC MSMT LEADCHNL LV IMPEDANCE VALUE: 400 Ohm
MDC IDC MSMT LEADCHNL LV PACING THRESHOLD AMPLITUDE: 2.5 V
MDC IDC MSMT LEADCHNL LV PACING THRESHOLD PULSEWIDTH: 1 ms
MDC IDC MSMT LEADCHNL RA PACING THRESHOLD AMPLITUDE: 0.75 V
MDC IDC MSMT LEADCHNL RA PACING THRESHOLD PULSEWIDTH: 0.5 ms
MDC IDC MSMT LEADCHNL RA SENSING INTR AMPL: 2 mV
MDC IDC MSMT LEADCHNL RV PACING THRESHOLD AMPLITUDE: 1.25 V
MDC IDC MSMT LEADCHNL RV PACING THRESHOLD PULSEWIDTH: 0.5 ms
MDC IDC MSMT LEADCHNL RV PACING THRESHOLD PULSEWIDTH: 0.5 ms
MDC IDC MSMT LEADCHNL RV SENSING INTR AMPL: 12 mV
MDC IDC MSMT LEADCHNL RV SENSING INTR AMPL: 12 mV
MDC IDC PG SERIAL: 7170478
MDC IDC SESS DTM: 20190521142839
MDC IDC SET LEADCHNL LV PACING AMPLITUDE: 3.375
MDC IDC SET LEADCHNL RV PACING AMPLITUDE: 2.5 V
MDC IDC STAT BRADY RA PERCENT PACED: 97 %
MDC IDC STAT BRADY RV PERCENT PACED: 99 %

## 2017-08-06 LAB — CUP PACEART REMOTE DEVICE CHECK
Battery Remaining Longevity: 13 mo
Battery Voltage: 2.81 V
Brady Statistic AP VS Percent: 1 %
Brady Statistic AS VS Percent: 1 %
Date Time Interrogation Session: 20190515080018
HIGH POWER IMPEDANCE MEASURED VALUE: 47 Ohm
HighPow Impedance: 47 Ohm
Implantable Lead Implant Date: 20051221
Implantable Lead Implant Date: 20150304
Implantable Lead Location: 753858
Implantable Lead Location: 753859
Implantable Lead Model: 1581
Implantable Pulse Generator Implant Date: 20150304
Lead Channel Impedance Value: 380 Ohm
Lead Channel Pacing Threshold Amplitude: 0.75 V
Lead Channel Pacing Threshold Amplitude: 2 V
Lead Channel Pacing Threshold Pulse Width: 0.5 ms
Lead Channel Sensing Intrinsic Amplitude: 12 mV
Lead Channel Setting Pacing Amplitude: 2 V
Lead Channel Setting Pacing Amplitude: 2.5 V
Lead Channel Setting Pacing Amplitude: 3 V
Lead Channel Setting Pacing Pulse Width: 0.5 ms
Lead Channel Setting Pacing Pulse Width: 1 ms
MDC IDC LEAD IMPLANT DT: 20150304
MDC IDC LEAD LOCATION: 753860
MDC IDC MSMT BATTERY REMAINING PERCENTAGE: 25 %
MDC IDC MSMT LEADCHNL LV IMPEDANCE VALUE: 430 Ohm
MDC IDC MSMT LEADCHNL LV PACING THRESHOLD PULSEWIDTH: 1 ms
MDC IDC MSMT LEADCHNL RA SENSING INTR AMPL: 2 mV
MDC IDC MSMT LEADCHNL RV IMPEDANCE VALUE: 380 Ohm
MDC IDC MSMT LEADCHNL RV PACING THRESHOLD AMPLITUDE: 1 V
MDC IDC MSMT LEADCHNL RV PACING THRESHOLD PULSEWIDTH: 0.5 ms
MDC IDC SET LEADCHNL RV SENSING SENSITIVITY: 0.5 mV
MDC IDC STAT BRADY AP VP PERCENT: 96 %
MDC IDC STAT BRADY AS VP PERCENT: 2.6 %
MDC IDC STAT BRADY RA PERCENT PACED: 97 %
Pulse Gen Serial Number: 7170478

## 2017-08-06 NOTE — Patient Instructions (Addendum)
Medication Instructions:  Your physician recommends that you continue on your current medications as directed. Please refer to the Current Medication list given to you today.  Labwork: You will have labs drawn today: CBC, BMP, TSH and LFTs    Testing/Procedures: None ordered.  Follow-Up: Your physician wants you to follow-up in: 6 months with [name] Marshall, NP. You will receive a reminder letter in the mail two months in advance. If you don't receive a letter, please call our office to schedule the follow-up appointment.  Remote monitoring is used to monitor your ICD from home. This monitoring reduces the number of office visits required to check your device to one time per year. It allows [place] to keep an eye on the functioning of your device to ensure it is working properly. You are scheduled for a device check from home on 10/31/2017. You may send your transmission at any time that day. If you have a wireless device, the transmission will be sent automatically. After your physician reviews your transmission, you will receive a postcard with your next transmission date.    Any Other Special Instructions Will Be Listed Below (If Applicable).     If you need a refill on your cardiac medications before your next appointment, please call your pharmacy.
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
## 2017-08-07 LAB — CBC WITH DIFFERENTIAL/PLATELET
BASOS: 0 %
Basophils Absolute: 0 10*3/uL (ref 0.0–0.2)
EOS (ABSOLUTE): 0.1 10*3/uL (ref 0.0–0.4)
EOS: 1 %
HEMOGLOBIN: 12.8 g/dL — AB (ref 13.0–17.7)
Hematocrit: 38.7 % (ref 37.5–51.0)
IMMATURE GRANS (ABS): 0 10*3/uL (ref 0.0–0.1)
IMMATURE GRANULOCYTES: 0 %
LYMPHS: 18 %
Lymphocytes Absolute: 1.8 10*3/uL (ref 0.7–3.1)
MCH: 31.1 pg (ref 26.6–33.0)
MCHC: 33.1 g/dL (ref 31.5–35.7)
MCV: 94 fL (ref 79–97)
MONOCYTES: 7 %
Monocytes Absolute: 0.7 10*3/uL (ref 0.1–0.9)
NEUTROS ABS: 7.6 10*3/uL — AB (ref 1.4–7.0)
Neutrophils: 74 %
Platelets: 193 10*3/uL (ref 150–450)
RBC: 4.12 x10E6/uL — ABNORMAL LOW (ref 4.14–5.80)
RDW: 13.8 % (ref 12.3–15.4)
WBC: 10.2 10*3/uL (ref 3.4–10.8)

## 2017-08-07 LAB — HEPATIC FUNCTION PANEL
ALBUMIN: 3.9 g/dL (ref 3.5–4.7)
ALT: 9 IU/L (ref 0–44)
AST: 11 IU/L (ref 0–40)
Alkaline Phosphatase: 71 IU/L (ref 39–117)
BILIRUBIN TOTAL: 0.3 mg/dL (ref 0.0–1.2)
BILIRUBIN, DIRECT: 0.14 mg/dL (ref 0.00–0.40)
TOTAL PROTEIN: 5.8 g/dL — AB (ref 6.0–8.5)

## 2017-08-07 LAB — BASIC METABOLIC PANEL
BUN/Creatinine Ratio: 19 (ref 10–24)
BUN: 37 mg/dL — AB (ref 8–27)
CALCIUM: 9.2 mg/dL (ref 8.6–10.2)
CO2: 24 mmol/L (ref 20–29)
CREATININE: 1.98 mg/dL — AB (ref 0.76–1.27)
Chloride: 105 mmol/L (ref 96–106)
GFR, EST AFRICAN AMERICAN: 34 mL/min/{1.73_m2} — AB (ref >59)
GFR, EST NON AFRICAN AMERICAN: 29 mL/min/{1.73_m2} — AB (ref >59)
Glucose: 127 mg/dL — ABNORMAL HIGH (ref 65–99)
Potassium: 4.5 mmol/L (ref 3.5–5.2)
Sodium: 143 mmol/L (ref 134–144)

## 2017-08-07 LAB — DIGOXIN LEVEL: Digoxin, Serum: 0.7 ng/mL (ref 0.5–0.9)

## 2017-08-07 LAB — TSH: TSH: 2.06 u[IU]/mL (ref 0.450–4.500)

## 2017-08-09 ENCOUNTER — Telehealth: Payer: Self-pay | Admitting: Internal Medicine

## 2017-08-09 MED ORDER — ACCU-CHEK AVIVA PLUS W/DEVICE KIT: PACK | 99 refills | Status: DC

## 2017-08-09 MED ORDER — ACCU-CHEK AVIVA PLUS W/DEVICE KIT: PACK | 99 refills | Status: AC

## 2017-08-09 MED ORDER — GLUCOSE BLOOD VI STRP: ORAL_STRIP | 12 refills | Status: DC

## 2017-08-09 MED ORDER — GLUCOSE BLOOD VI STRP: ORAL_STRIP | 3 refills | Status: DC

## 2017-08-09 MED ORDER — LANCETS MISC: 12 refills | Status: DC

## 2017-08-09 NOTE — Telephone Encounter (Signed)
Copied from [place] [id number]. Topic: Quick Communication - See Telephone Encounter >> Aug 09, 2017  8:59 AM Vernona Rieger wrote: CRM for notification. See Telephone encounter for: 08/09/17.  Patient's wife said that his insurance will no longer pay for the " one touch " diabetic meter. She would like Dr Regis Bill to send in a new prescription for it " Accu-check [employer] plus meter with the test strips and the lancets ". Please send that too [place], [place]

## 2017-08-09 NOTE — Telephone Encounter (Signed)
Dr Regis Bill please be aware and give okay to change Rx. Thanks.

## 2017-08-09 NOTE — Telephone Encounter (Signed)
Approve the changes  Please send in as requested

## 2017-08-09 NOTE — Telephone Encounter (Signed)
Rx sent to [facility] for the new glucometer and test strips.  Pt aware. [facility] can only process 30 day supply, cannot do 90 day.  Nothing further needed.

## 2017-08-28 ENCOUNTER — Ambulatory Visit (INDEPENDENT_AMBULATORY_CARE_PROVIDER_SITE_OTHER): Payer: Medicare Other | Admitting: Podiatry

## 2017-08-28 ENCOUNTER — Encounter: Payer: Self-pay | Admitting: Podiatry

## 2017-08-28 DIAGNOSIS — M79676 Pain in unspecified toe(s): Secondary | ICD-10-CM

## 2017-08-28 DIAGNOSIS — E1140 Type 2 diabetes mellitus with diabetic neuropathy, unspecified: Secondary | ICD-10-CM | POA: Diagnosis not present

## 2017-08-28 DIAGNOSIS — Q828 Other specified congenital malformations of skin: Secondary | ICD-10-CM

## 2017-08-28 DIAGNOSIS — D689 Coagulation defect, unspecified: Secondary | ICD-10-CM

## 2017-08-28 DIAGNOSIS — B351 Tinea unguium: Secondary | ICD-10-CM

## 2017-08-28 NOTE — Progress Notes (Signed)
Patient ID: [name], male   DOB: 08/21/1928, 84 y.o.   MRN: [id number] Complaint:  Visit Type: Patient returns to my office for continued preventative foot care services. Complaint: Patient states" my nails have grown long and thick and become painful to walk and wear shoes" Patient has been diagnosed with DM with neuropathy.. He presents for preventative foot care services. No changes to ROS.  He also has painful callus under the outside ball of both feet.  Podiatric Exam: Vascular: dorsalis pedis and posterior tibial pulses are palpable bilateral. Capillary return is immediate. Temperature gradient is WNL. Skin turgor WNL  Sensorium: Diminished  Semmes Weinstein monofilament test. Normal tactile sensation bilaterally. Nail Exam: Pt has thick disfigured discolored nails with subungual debris noted bilateral entire nail hallux through fifth toenails Ulcer Exam: There is no evidence of ulcer or pre-ulcerative changes or infection. Orthopedic Exam: Muscle tone and strength are WNL. No limitations in general ROM. No crepitus or effusions noted. Foot type and digits show no abnormalities. Bony prominences are unremarkable. Asymptomatic HAV B/L. Skin:  Porokeratosis sub 5th metatarsal bilateral.. No infection or ulcers.   Diagnosis:  Tinea unguium, Pain in right toe, pain in left toes ,  Treatment & Plan Procedures and Treatment: Consent by patient was obtained for treatment procedures. The patient understood the discussion of treatment and procedures well. All questions were answered thoroughly reviewed. Debridement of mycotic and hypertrophic toenails, 1 through 5 bilateral and clearing of subungual debris. No ulceration, no infection noted.  ABN signed for 2019. Return Visit-Office Procedure: Patient instructed to return to the office for a follow up visit 10 weeks  for continued evaluation and treatment.   Akiah Bauch DPM 

## 2017-08-29 DIAGNOSIS — H903 Sensorineural hearing loss, bilateral: Secondary | ICD-10-CM | POA: Diagnosis not present

## 2017-08-29 DIAGNOSIS — H6123 Impacted cerumen, bilateral: Secondary | ICD-10-CM | POA: Diagnosis not present

## 2017-08-29 DIAGNOSIS — H60552 Acute reactive otitis externa, left ear: Secondary | ICD-10-CM | POA: Diagnosis not present

## 2017-09-05 ENCOUNTER — Other Ambulatory Visit: Payer: Self-pay | Admitting: Internal Medicine

## 2017-09-05 NOTE — Telephone Encounter (Signed)
Last OV 07/02/2017   Last refilled  05/24/2017 disp 90 with 1 refill   Sent to PCP to advise

## 2017-09-10 ENCOUNTER — Other Ambulatory Visit (HOSPITAL_COMMUNITY): Payer: Self-pay

## 2017-09-10 MED ORDER — CARVEDILOL 6.25 MG PO TABS: 6.2500 mg | ORAL_TABLET | Freq: Two times a day (BID) | ORAL | 0 refills | Status: DC

## 2017-09-10 MED ORDER — APIXABAN 2.5 MG PO TABS: 2.5000 mg | ORAL_TABLET | Freq: Two times a day (BID) | ORAL | 0 refills | Status: DC

## 2017-09-12 ENCOUNTER — Other Ambulatory Visit (HOSPITAL_COMMUNITY): Payer: Self-pay

## 2017-09-12 MED ORDER — DIGOXIN 125 MCG PO TABS: ORAL_TABLET | ORAL | 0 refills | Status: DC

## 2017-09-18 ENCOUNTER — Encounter (INDEPENDENT_AMBULATORY_CARE_PROVIDER_SITE_OTHER): Payer: Self-pay | Admitting: Orthopaedic Surgery

## 2017-09-18 ENCOUNTER — Ambulatory Visit (INDEPENDENT_AMBULATORY_CARE_PROVIDER_SITE_OTHER): Payer: Medicare Other | Admitting: Orthopaedic Surgery

## 2017-09-18 DIAGNOSIS — M25511 Pain in right shoulder: Secondary | ICD-10-CM

## 2017-09-18 DIAGNOSIS — I255 Ischemic cardiomyopathy: Secondary | ICD-10-CM | POA: Diagnosis not present

## 2017-09-18 DIAGNOSIS — G8929 Other chronic pain: Secondary | ICD-10-CM | POA: Diagnosis not present

## 2017-09-18 MED ORDER — LIDOCAINE HCL 1 % IJ SOLN
3.0000 mL | INTRAMUSCULAR | Status: AC | PRN
  Administered 2017-09-18: 3 mL

## 2017-09-18 MED ORDER — METHYLPREDNISOLONE ACETATE 40 MG/ML IJ SUSP
40.0000 mg | INTRAMUSCULAR | Status: AC | PRN
  Administered 2017-09-18: 40 mg via INTRA_ARTICULAR
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
## 2017-10-01 ENCOUNTER — Other Ambulatory Visit (HOSPITAL_COMMUNITY): Payer: Self-pay | Admitting: *Deleted

## 2017-10-01 MED ORDER — SPIRONOLACTONE 25 MG PO TABS: 25.0000 mg | ORAL_TABLET | Freq: Every day | ORAL | 0 refills | Status: DC

## 2017-10-07 ENCOUNTER — Other Ambulatory Visit: Payer: Self-pay | Admitting: Cardiology

## 2017-10-07 ENCOUNTER — Other Ambulatory Visit (INDEPENDENT_AMBULATORY_CARE_PROVIDER_SITE_OTHER): Payer: Medicare Other

## 2017-10-07 DIAGNOSIS — I2510 Atherosclerotic heart disease of native coronary artery without angina pectoris: Secondary | ICD-10-CM

## 2017-10-07 DIAGNOSIS — N183 Chronic kidney disease, stage 3 unspecified: Secondary | ICD-10-CM

## 2017-10-07 DIAGNOSIS — Z794 Long term (current) use of insulin: Secondary | ICD-10-CM

## 2017-10-07 DIAGNOSIS — E1140 Type 2 diabetes mellitus with diabetic neuropathy, unspecified: Secondary | ICD-10-CM

## 2017-10-07 DIAGNOSIS — D649 Anemia, unspecified: Secondary | ICD-10-CM | POA: Diagnosis not present

## 2017-10-07 DIAGNOSIS — Z789 Other specified health status: Secondary | ICD-10-CM | POA: Diagnosis not present

## 2017-10-07 DIAGNOSIS — I5022 Chronic systolic (congestive) heart failure: Secondary | ICD-10-CM

## 2017-10-07 DIAGNOSIS — Z79899 Other long term (current) drug therapy: Secondary | ICD-10-CM

## 2017-10-07 LAB — BASIC METABOLIC PANEL
BUN: 36 mg/dL — AB (ref 6–23)
CALCIUM: 8.9 mg/dL (ref 8.4–10.5)
CO2: 30 mEq/L (ref 19–32)
Chloride: 104 mEq/L (ref 96–112)
Creatinine, Ser: 1.97 mg/dL — ABNORMAL HIGH (ref 0.40–1.50)
GFR: 34.32 mL/min — ABNORMAL LOW (ref >60.00)
Glucose, Bld: 120 mg/dL — ABNORMAL HIGH (ref 70–99)
POTASSIUM: 4.4 meq/L (ref 3.5–5.1)
SODIUM: 142 meq/L (ref 135–145)

## 2017-10-07 LAB — CBC WITH DIFFERENTIAL/PLATELET
Basophils Absolute: 0 10*3/uL (ref 0.0–0.1)
Basophils Relative: 0.2 % (ref 0.0–3.0)
EOS ABS: 0.1 10*3/uL (ref 0.0–0.7)
Eosinophils Relative: 0.9 % (ref 0.0–5.0)
HCT: 38.8 % — ABNORMAL LOW (ref 39.0–52.0)
Hemoglobin: 13.2 g/dL (ref 13.0–17.0)
LYMPHS ABS: 1.6 10*3/uL (ref 0.7–4.0)
Lymphocytes Relative: 17.1 % (ref 12.0–46.0)
MCHC: 34 g/dL (ref 30.0–36.0)
MCV: 94.8 fl (ref 78.0–100.0)
MONO ABS: 0.5 10*3/uL (ref 0.1–1.0)
Monocytes Relative: 5.8 % (ref 3.0–12.0)
NEUTROS ABS: 6.9 10*3/uL (ref 1.4–7.7)
Neutrophils Relative %: 76 % (ref 43.0–77.0)
PLATELETS: 168 10*3/uL (ref 150.0–400.0)
RBC: 4.09 Mil/uL — ABNORMAL LOW (ref 4.22–5.81)
RDW: 13.6 % (ref 11.5–15.5)
WBC: 9.1 10*3/uL (ref 4.0–10.5)

## 2017-10-07 LAB — HEPATIC FUNCTION PANEL
ALT: 11 U/L (ref 0–53)
AST: 10 U/L (ref 0–37)
Albumin: 3.6 g/dL (ref 3.5–5.2)
Alkaline Phosphatase: 64 U/L (ref 39–117)
BILIRUBIN DIRECT: 0.1 mg/dL (ref 0.0–0.3)
BILIRUBIN TOTAL: 0.4 mg/dL (ref 0.2–1.2)
Total Protein: 5.8 g/dL — ABNORMAL LOW (ref 6.0–8.3)

## 2017-10-07 LAB — LIPID PANEL
CHOL/HDL RATIO: 2
Cholesterol: 75 mg/dL (ref 0–200)
HDL: 30.5 mg/dL — AB (ref >39.00)
LDL CALC: 15 mg/dL (ref 0–99)
NonHDL: 44.62
Triglycerides: 148 mg/dL (ref 0.0–149.0)
VLDL: 29.6 mg/dL (ref 0.0–40.0)

## 2017-10-07 LAB — HEMOGLOBIN A1C: Hgb A1c MFr Bld: 7.1 % — ABNORMAL HIGH (ref 4.6–6.5)

## 2017-10-14 ENCOUNTER — Ambulatory Visit (INDEPENDENT_AMBULATORY_CARE_PROVIDER_SITE_OTHER): Payer: Medicare Other | Admitting: Internal Medicine

## 2017-10-14 ENCOUNTER — Other Ambulatory Visit: Payer: Medicare Other

## 2017-10-14 ENCOUNTER — Encounter: Payer: Self-pay | Admitting: Internal Medicine

## 2017-10-14 VITALS — BP 108/62 | HR 60 | Temp 97.9°F | Wt 190.3 lb

## 2017-10-14 DIAGNOSIS — Z794 Long term (current) use of insulin: Secondary | ICD-10-CM | POA: Diagnosis not present

## 2017-10-14 DIAGNOSIS — I255 Ischemic cardiomyopathy: Secondary | ICD-10-CM

## 2017-10-14 DIAGNOSIS — Z789 Other specified health status: Secondary | ICD-10-CM

## 2017-10-14 DIAGNOSIS — I10 Essential (primary) hypertension: Secondary | ICD-10-CM

## 2017-10-14 DIAGNOSIS — N183 Chronic kidney disease, stage 3 unspecified: Secondary | ICD-10-CM

## 2017-10-14 DIAGNOSIS — E1140 Type 2 diabetes mellitus with diabetic neuropathy, unspecified: Secondary | ICD-10-CM | POA: Diagnosis not present

## 2017-10-14 DIAGNOSIS — Z79899 Other long term (current) drug therapy: Secondary | ICD-10-CM

## 2017-10-14 DIAGNOSIS — E7849 Other hyperlipidemia: Secondary | ICD-10-CM

## 2017-10-14 MED ORDER — PRAMIPEXOLE DIHYDROCHLORIDE 0.5 MG PO TABS: ORAL_TABLET | ORAL | 3 refills | Status: DC

## 2017-10-14 MED ORDER — GLIPIZIDE 10 MG PO TABS: 10.0000 mg | ORAL_TABLET | Freq: Every day | ORAL | 3 refills | Status: DC

## 2017-10-14 MED ORDER — SILODOSIN 8 MG PO CAPS: ORAL_CAPSULE | ORAL | 3 refills | Status: DC

## 2017-10-14 MED ORDER — ESCITALOPRAM OXALATE 10 MG PO TABS: 10.0000 mg | ORAL_TABLET | Freq: Every day | ORAL | 3 refills | Status: DC

## 2017-10-14 MED ORDER — SEMAGLUTIDE(0.25 OR 0.5MG/DOS) 2 MG/1.5ML ~~LOC~~ SOPN: 0.5000 mg | PEN_INJECTOR | SUBCUTANEOUS | 3 refills | Status: DC

## 2017-10-14 MED ORDER — INSULIN DEGLUDEC 100 UNIT/ML ~~LOC~~ SOPN: PEN_INJECTOR | SUBCUTANEOUS | 3 refills | Status: DC

## 2017-10-14 NOTE — Patient Instructions (Addendum)
Your diabetes is under much better control   Can try taking the ozempic every 10-14 days  And may still be   Pretty effective .    Samples  Will rx  [facility] of any of your medications .   Will refill other meds as planned   ROV in 4-5 months or as needed    Lab Results  Component Value Date   WBC 9.1 10/07/2017   HGB 13.2 10/07/2017   HCT 38.8 (L) 10/07/2017   PLT 168.0 10/07/2017   GLUCOSE 120 (H) 10/07/2017   CHOL 75 10/07/2017   TRIG 148.0 10/07/2017   HDL 30.50 (L) 10/07/2017   LDLCALC 15 10/07/2017   ALT 11 10/07/2017   AST 10 10/07/2017   NA 142 10/07/2017   K 4.4 10/07/2017   CL 104 10/07/2017   CREATININE 1.97 (H) 10/07/2017   BUN 36 (H) 10/07/2017   CO2 30 10/07/2017   TSH 2.060 08/06/2017   PSA 2.47 08/02/2010   INR 1.05 02/13/2016   HGBA1C 7.1 (H) 10/07/2017   MICROALBUR 0.7 11/16/2016
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
## 2017-10-28 DIAGNOSIS — H9202 Otalgia, left ear: Secondary | ICD-10-CM | POA: Diagnosis not present

## 2017-10-29 DIAGNOSIS — D485 Neoplasm of uncertain behavior of skin: Secondary | ICD-10-CM | POA: Diagnosis not present

## 2017-10-29 DIAGNOSIS — L578 Other skin changes due to chronic exposure to nonionizing radiation: Secondary | ICD-10-CM | POA: Diagnosis not present

## 2017-10-29 DIAGNOSIS — L570 Actinic keratosis: Secondary | ICD-10-CM | POA: Diagnosis not present

## 2017-10-29 DIAGNOSIS — C4442 Squamous cell carcinoma of skin of scalp and neck: Secondary | ICD-10-CM | POA: Diagnosis not present

## 2017-10-29 DIAGNOSIS — D044 Carcinoma in situ of skin of scalp and neck: Secondary | ICD-10-CM | POA: Diagnosis not present

## 2017-10-31 ENCOUNTER — Ambulatory Visit (INDEPENDENT_AMBULATORY_CARE_PROVIDER_SITE_OTHER): Payer: Medicare Other | Admitting: *Deleted

## 2017-10-31 DIAGNOSIS — I5022 Chronic systolic (congestive) heart failure: Secondary | ICD-10-CM | POA: Diagnosis not present

## 2017-10-31 DIAGNOSIS — I255 Ischemic cardiomyopathy: Secondary | ICD-10-CM

## 2017-10-31 NOTE — Progress Notes (Signed)
Remote ICD transmission.   

## 2017-11-06 ENCOUNTER — Ambulatory Visit (INDEPENDENT_AMBULATORY_CARE_PROVIDER_SITE_OTHER): Payer: Medicare Other | Admitting: Podiatry

## 2017-11-06 ENCOUNTER — Encounter: Payer: Self-pay | Admitting: Podiatry

## 2017-11-06 DIAGNOSIS — M79676 Pain in unspecified toe(s): Secondary | ICD-10-CM | POA: Diagnosis not present

## 2017-11-06 DIAGNOSIS — D689 Coagulation defect, unspecified: Secondary | ICD-10-CM | POA: Diagnosis not present

## 2017-11-06 DIAGNOSIS — B351 Tinea unguium: Secondary | ICD-10-CM | POA: Diagnosis not present

## 2017-11-06 DIAGNOSIS — E1140 Type 2 diabetes mellitus with diabetic neuropathy, unspecified: Secondary | ICD-10-CM | POA: Diagnosis not present

## 2017-11-06 NOTE — Progress Notes (Signed)
Patient ID: [name], male   DOB: Jan 10, 1929, 84 y.o.   MRN: [id number] Complaint:  Visit Type: Patient returns to my office for continued preventative foot care services. Complaint: Patient states" my nails have grown long and thick and become painful to walk and wear shoes" Patient has been diagnosed with DM with neuropathy.[name] He presents for preventative foot care services. No changes to ROS.  He also has painful callus under the outside ball of both feet.  Podiatric Exam: Vascular: dorsalis pedis and posterior tibial pulses are palpable bilateral. Capillary return is immediate. Temperature gradient is WNL. Skin turgor WNL  Sensorium: Diminished  Semmes Weinstein monofilament test. Normal tactile sensation bilaterally. Nail Exam: Pt has thick disfigured discolored nails with subungual debris noted bilateral entire nail hallux through fifth toenails Ulcer Exam: There is no evidence of ulcer or pre-ulcerative changes or infection. Orthopedic Exam: Muscle tone and strength are WNL. No limitations in general ROM. No crepitus or effusions noted. Foot type and digits show no abnormalities. Bony prominences are unremarkable. Asymptomatic HAV B/L. Skin:  Porokeratosis sub 5th metatarsal bilateral.. No infection or ulcers.   Diagnosis:  Tinea unguium, Pain in right toe, pain in left toes ,  Treatment & Plan Procedures and Treatment: Consent by patient was obtained for treatment procedures. The patient understood the discussion of treatment and procedures well. All questions were answered thoroughly reviewed. Debridement of mycotic and hypertrophic toenails, 1 through 5 bilateral and clearing of subungual debris. No ulceration, no infection noted.  ABN signed for 2019. Return Visit-Office Procedure: Patient instructed to return to the office for a follow up visit 10 weeks  for continued evaluation and treatment.   Gardiner Barefoot DPM

## 2017-11-26 ENCOUNTER — Ambulatory Visit (HOSPITAL_COMMUNITY)
Admission: RE | Admit: 2017-11-26 | Discharge: 2017-11-26 | Disposition: A | Discharge status: Home / Self Care | Payer: Medicare Other | Source: Ambulatory Visit | Attending: Cardiology | Admitting: Cardiology

## 2017-11-26 VITALS — BP 130/68 | HR 60 | Wt 191.0 lb

## 2017-11-26 DIAGNOSIS — I6521 Occlusion and stenosis of right carotid artery: Secondary | ICD-10-CM | POA: Diagnosis not present

## 2017-11-26 DIAGNOSIS — Z87891 Personal history of nicotine dependence: Secondary | ICD-10-CM | POA: Diagnosis not present

## 2017-11-26 DIAGNOSIS — I255 Ischemic cardiomyopathy: Secondary | ICD-10-CM | POA: Insufficient documentation

## 2017-11-26 DIAGNOSIS — I2510 Atherosclerotic heart disease of native coronary artery without angina pectoris: Secondary | ICD-10-CM | POA: Insufficient documentation

## 2017-11-26 DIAGNOSIS — I480 Paroxysmal atrial fibrillation: Secondary | ICD-10-CM | POA: Diagnosis not present

## 2017-11-26 DIAGNOSIS — Z951 Presence of aortocoronary bypass graft: Secondary | ICD-10-CM | POA: Diagnosis not present

## 2017-11-26 DIAGNOSIS — Z7901 Long term (current) use of anticoagulants: Secondary | ICD-10-CM | POA: Insufficient documentation

## 2017-11-26 DIAGNOSIS — Z794 Long term (current) use of insulin: Secondary | ICD-10-CM | POA: Diagnosis not present

## 2017-11-26 DIAGNOSIS — G2581 Restless legs syndrome: Secondary | ICD-10-CM | POA: Diagnosis not present

## 2017-11-26 DIAGNOSIS — N183 Chronic kidney disease, stage 3 (moderate): Secondary | ICD-10-CM | POA: Diagnosis not present

## 2017-11-26 DIAGNOSIS — F329 Major depressive disorder, single episode, unspecified: Secondary | ICD-10-CM | POA: Insufficient documentation

## 2017-11-26 DIAGNOSIS — G4733 Obstructive sleep apnea (adult) (pediatric): Secondary | ICD-10-CM | POA: Diagnosis not present

## 2017-11-26 DIAGNOSIS — Z79899 Other long term (current) drug therapy: Secondary | ICD-10-CM | POA: Diagnosis not present

## 2017-11-26 DIAGNOSIS — Z4502 Encounter for adjustment and management of automatic implantable cardiac defibrillator: Secondary | ICD-10-CM | POA: Diagnosis not present

## 2017-11-26 DIAGNOSIS — E1122 Type 2 diabetes mellitus with diabetic chronic kidney disease: Secondary | ICD-10-CM | POA: Diagnosis not present

## 2017-11-26 DIAGNOSIS — I130 Hypertensive heart and chronic kidney disease with heart failure and stage 1 through stage 4 chronic kidney disease, or unspecified chronic kidney disease: Secondary | ICD-10-CM | POA: Insufficient documentation

## 2017-11-26 DIAGNOSIS — I5022 Chronic systolic (congestive) heart failure: Secondary | ICD-10-CM | POA: Insufficient documentation

## 2017-11-26 DIAGNOSIS — E785 Hyperlipidemia, unspecified: Secondary | ICD-10-CM | POA: Diagnosis not present

## 2017-11-26 LAB — TSH: TSH: 1.557 u[IU]/mL (ref 0.350–4.500)

## 2017-11-26 LAB — CBC
HEMATOCRIT: 41.4 % (ref 39.0–52.0)
Hemoglobin: 13.2 g/dL (ref 13.0–17.0)
MCH: 31.7 pg (ref 26.0–34.0)
MCHC: 31.9 g/dL (ref 30.0–36.0)
MCV: 99.5 fL (ref 78.0–100.0)
Platelets: 177 10*3/uL (ref 150–400)
RBC: 4.16 MIL/uL — ABNORMAL LOW (ref 4.22–5.81)
RDW: 12.8 % (ref 11.5–15.5)
WBC: 11.6 10*3/uL — AB (ref 4.0–10.5)

## 2017-11-26 LAB — COMPREHENSIVE METABOLIC PANEL
ALBUMIN: 3.5 g/dL (ref 3.5–5.0)
ALT: 12 U/L (ref 0–44)
ANION GAP: 9 (ref 5–15)
AST: 16 U/L (ref 15–41)
Alkaline Phosphatase: 59 U/L (ref 38–126)
BILIRUBIN TOTAL: 0.8 mg/dL (ref 0.3–1.2)
BUN: 36 mg/dL — ABNORMAL HIGH (ref 8–23)
CHLORIDE: 103 mmol/L (ref 98–111)
CO2: 28 mmol/L (ref 22–32)
Calcium: 9 mg/dL (ref 8.9–10.3)
Creatinine, Ser: 2.45 mg/dL — ABNORMAL HIGH (ref 0.61–1.24)
GFR calc Af Amer: 26 mL/min — ABNORMAL LOW (ref >60)
GFR, EST NON AFRICAN AMERICAN: 22 mL/min — AB (ref >60)
GLUCOSE: 171 mg/dL — AB (ref 70–99)
POTASSIUM: 4.6 mmol/L (ref 3.5–5.1)
Sodium: 140 mmol/L (ref 135–145)
TOTAL PROTEIN: 6 g/dL — AB (ref 6.5–8.1)

## 2017-11-26 LAB — DIGOXIN LEVEL: Digoxin Level: 0.6 ng/mL — ABNORMAL LOW (ref 0.8–2.0)

## 2017-11-26 MED ORDER — AMIODARONE HCL 200 MG PO TABS: 100.0000 mg | ORAL_TABLET | Freq: Every day | ORAL | 3 refills | Status: DC

## 2017-11-26 MED ORDER — POTASSIUM CHLORIDE CRYS ER 20 MEQ PO TBCR: EXTENDED_RELEASE_TABLET | ORAL | 3 refills | Status: DC

## 2017-11-26 MED ORDER — SPIRONOLACTONE 25 MG PO TABS: 25.0000 mg | ORAL_TABLET | Freq: Every day | ORAL | 3 refills | Status: DC

## 2017-11-26 MED ORDER — ATORVASTATIN CALCIUM 40 MG PO TABS: 40.0000 mg | ORAL_TABLET | Freq: Every day | ORAL | 3 refills | Status: DC

## 2017-11-26 MED ORDER — CARVEDILOL 6.25 MG PO TABS: 9.3750 mg | ORAL_TABLET | Freq: Two times a day (BID) | ORAL | 3 refills | Status: DC

## 2017-11-26 MED ORDER — ISOSORBIDE MONONITRATE ER 30 MG PO TB24: 30.0000 mg | ORAL_TABLET | Freq: Every day | ORAL | 3 refills | Status: DC

## 2017-11-26 MED ORDER — HYDRALAZINE HCL 25 MG PO TABS: 25.0000 mg | ORAL_TABLET | Freq: Three times a day (TID) | ORAL | 3 refills | Status: DC

## 2017-11-26 MED ORDER — TORSEMIDE 20 MG PO TABS: 60.0000 mg | ORAL_TABLET | Freq: Every day | ORAL | 3 refills | Status: DC

## 2017-11-26 MED ORDER — APIXABAN 2.5 MG PO TABS: 2.5000 mg | ORAL_TABLET | Freq: Two times a day (BID) | ORAL | 3 refills | Status: DC

## 2017-11-26 NOTE — Patient Instructions (Signed)
Labs today (will call for abnormal results, otherwise no news is good news)  INCREASE Coreg to 9.375 mg (1.5 Tablet) Twice Daily  Follow up in 3 months.

## 2017-11-26 NOTE — Progress Notes (Signed)
Patient ID: [name], male   DOB: [birth year], 84 y.o.   MRN: [id number]     Advanced Heart Failure Clinic Note PCP: Dr. Regis Bill Cardiology: Dr. Mariella Saa is a 84 y.o. male  with history of CAD s/p CABG, ischemic cardiomyopathy, and paroxysmal atrial fibrillation presents for followup.  Patient was admitted in 2/16 with CHF and diuresed, he was sent home on Lasix 60 mg bid.  He missed 3 days of the pm Lasix dose and was re-admitted on 05/18/14 with acute on chronic systolic CHF with dyspnea and hypoxemia. He was diuresed with Lasix gtt and metolazone.  While in the hospital, he went into atrial fibrillation with RVR requiring cardioversion.  Creatinine was elevated and ramipril was stopped.  We had to cut back on Coreg due to hypotension and low output.  He was begun on low dose digoxin.    He presents today for followup of CHF. No dyspnea walking on flat ground.  He is short of breath if he walks fast up stairs or a hill.  Some unsteadiness but no lightheadedness.  He fell about 2 months ago (tripped).  No palpitations, no atrial fibrillation on device interrogation.  Weight is down 1 lb.   St Jude device interrogation: No VT, no AF, 96% a-pacing, >99% BiV pacing, stable thoracic impedance.   Labs (3/16): K 4.2, creatinine 2.26, digoxin < 0.2 Labs (05/31/2014): TSH 2.3 Dig level 0.6  Labs (06/14/2014): K 4.5 Creatinine 2.14, LDL 28, HDL 46 Labs (6/16): K 4.5, creatinine 2.08, BNP 712 Labs (7/16): K 4.1, creatinine 2.18, HCT 36.8, LFTs normal, digoxin 0.9 Labs (10/16): LDL 26, HDL 38, TSH normal, LFTs normal Labs (12/16): K 4.6, creatinine 2 Labs (2/17): LFTs normal, digoxin 1.1 Labs (4/17): K 4.7, creatinine 1.97, BUN 50, hgb 12.8, TSH normal, digoxin 0.8, LDL 29 Labs (7/17): digoxin 0.6 Labs (11/17): K 4.4, creatinine 2.13 Labs (12/17): hgb 11.4 Labs (5/18): LFTs normal, K 4.6, creatinne 2.21, hgb 10.9 Labs (7/19): Cr 1.97, K 4.4, Hgb 13.2, Hgb A1c 7.1. LFTs normal.  LDL  25  PMH: 1. CAD: S/p CABG 2005 with LIMA-Diagonal, SVG-LAD, SVG-LCx, SVG-RCA.  Cardiolite in 2/15 with EF 19%, no ischemia.  2. Ischemic cardiomyopathy: Echo (2/16) with EF 20-25%, severe LV dilation with RWMAs, moderate AI, moderate [name], moderately decreased RV systolic function.  St Jude CRT-D device.  - Echo 10/2016 LVEF 20-25% Grade 1 DD, Mild/Mod [name], mild LAE, Mildly reduced RV function, Mild RAE, Peak PA pressure 32 mmHg 3. Atrial fibrillation: Paroxysmal.  DCCV 3/16.  4. CKD 5. Carotid stenosis: Carotid dopplers (1/16) with 60-79% RICA stenosis.  Carotids (1/17) with bilateral 40-59% ICA stenosis.  - Carotids (1/18): 40-59% BICA stenosis.  6. HTN 7. Hyperlipidemia 8. Restless leg syndrome. 9. Type 2 diabetes. 10. OA 11. Depression 12. H/o CCY 74. H/o appy 14. OSA: Uses CPAP.  15. ABIs (7/17): Normal 16. Melanoma: s/p excision.   SH: Married, quit smoking 1980, [place] [employer] vet  FH: CAD  Review of systems complete and found to be negative unless listed in HPI.    Current Outpatient Medications  Medication Sig Dispense Refill  . acetaminophen (TYLENOL) 500 MG tablet Take 500 mg by mouth daily as needed for mild pain.    [name] amiodarone (PACERONE) 200 MG tablet Take 0.5 tablets (100 mg total) by mouth daily.    [name] apixaban (ELIQUIS) 2.5 MG TABS tablet Take 1 tablet (2.5 mg total) by mouth 2 (two) times daily. 180 tablet  0  . atorvastatin (LIPITOR) 40 MG tablet Take 1 tablet (40 mg total) by mouth daily. Please call for office visit. [phone] 90 tablet 0  . benzonatate (TESSALON) 100 MG capsule Take by mouth.    . Blood Glucose Monitoring Suppl (ACCU-CHEK AVIVA PLUS) w/Device KIT Check blood sugars daily up to three times daily or as needed. 1 kit prn  . carvedilol (COREG) 6.25 MG tablet Take 1 tablet (6.25 mg total) by mouth 2 (two) times daily. 180 tablet 0  . digoxin (DIGOX) 0.125 MG tablet TAKE 1/2 TABLET BY MOUTH DAILY 15 tablet 0  . escitalopram (LEXAPRO) 10 MG tablet Take  1 tablet (10 mg total) by mouth daily. 90 tablet 3  . gabapentin (NEURONTIN) 100 MG capsule Take 1 capsule (100 mg total) by mouth 3 (three) times daily. Can increase as directed (Patient taking differently: Take 100 mg by mouth daily as needed (pain and sleep). Can increase as directed) 90 capsule 3  . glipiZIDE (GLUCOTROL) 10 MG tablet Take 1 tablet (10 mg total) by mouth daily before breakfast. 90 tablet 3  . glucose blood (ACCU-CHEK AVIVA) test strip Use as instructed 100 each 12  . hydrALAZINE (APRESOLINE) 25 MG tablet Take 1 tablet (25 mg total) every 8 (eight) hours by mouth. 270 tablet 3  . hydrocortisone 2.5 % cream Apply topically.    . insulin degludec (TRESIBA FLEXTOUCH) 100 UNIT/ML SOPN FlexTouch Pen INJECT 34 UNITS UNDER THE SKIN EVERY DAY 45 mL 3  . Insulin Pen Needle (BD PEN NEEDLE NANO U/F) 32G X 4 MM MISC [place] TO TEST BLOOD SUGAR THREE TIMES DAILY 300 each 1  . isosorbide mononitrate (IMDUR) 30 MG 24 hr tablet Take 1 tablet (30 mg total) daily by mouth. 90 tablet 3  . KLOR-CON M20 20 MEQ tablet TAKE 2 TABLETS (40MEQ      TOTAL) DAILY 180 tablet 3  . Menthol, Topical Analgesic, (ICY HOT EX) Apply 1 application topically at bedtime as needed (leg pain).    . Multiple Vitamin (MULTIVITAMIN WITH MINERALS) TABS tablet Take 1 tablet by mouth daily.    Glory Rosebush DELICA LANCETS FINE MISC Use to test blood sugar 2-3 times daily 300 each 1  . polyethylene glycol (MIRALAX) packet Take 17 g by mouth daily. (Patient taking differently: Take 17 g by mouth daily as needed for mild constipation. ) 14 each 0  . pramipexole (MIRAPEX) 0.5 MG tablet TAKE 0.6m 2-3 HOURS BEFORE SLEEPFOR RESTLESS LEG 90 tablet 3  . saxagliptin HCl (ONGLYZA) 5 MG TABS tablet Take by mouth.    . Semaglutide (OZEMPIC) 0.25 or 0.5 MG/DOSE SOPN Inject 0.5 mg into the skin once a week. 1.5 mL 3  . silodosin (RAPAFLO) 8 MG CAPS capsule TAKE 1 CAPSULE DAILY WITH  BREAKFAST 90 capsule 3  . spironolactone (ALDACTONE) 25 MG tablet  Take 1 tablet (25 mg total) by mouth daily. 90 tablet 0  . torsemide (DEMADEX) 20 MG tablet Take 3 tablets (60 mg total) daily by mouth. Take extra as directed for wt gain 270 tablet 3   No current facility-administered medications for this encounter.    There were no vitals taken for this visit.   Wt Readings from Last 3 Encounters:  10/14/17 86.3 kg (190 lb 4.8 oz)  08/06/17 87.5 kg (193 lb)  07/02/17 88.8 kg (195 lb 12.8 oz)    General: Elderly appearing. No resp difficulty. HEENT: Normal Neck: Supple. JVP 5-6. Carotids 2+ bilat; no bruits. No thyromegaly or nodule  noted. Cor: PMI nondisplaced. RRR, 2/6 HSM at apex.  Lungs: CTAB, normal effort. Abdomen: Soft, non-tender, non-distended, no HSM. No bruits or masses. +BS  Extremities: No cyanosis, clubbing, or rash. R and LLE no edema.  Neuro: Alert & orientedx3, cranial nerves grossly intact. moves all 4 extremities w/o difficulty. Affect pleasant   Assessment/Plan: 1. Chronic systolic CHF: Ischemic cardiomyopathy.   - Echo 10/2016 LVEF 20-25%, Grade 1 DD.  - St Jude CRT-D device. - NYHA II - Volume status: euvolemic  - Continue torsemide 60 mg daily.  Check BMET.     - Continue current hydralazine/Imdur  - Will leave off ACEI for now with elevated creatinine.  - Continue current spironolactone.  - Continue current digoxin. Level 0.7 on 08/06/17. Repeat today.  - Increase Coreg to 9.375 mg bid.  2. Atrial fibrillation: Paroxysmal.   - ICD interrogation showed no atrial fibrillation.   - Continue amiodarone and Eliquis (dosed at 2.5 mg bid with age and renal dysfunction).  Will need to make sure to have regular eye exam with amiodarone.   - LFTs and TSH normal 07/2017, repeat today.  3. CKD: Stage 3 - Stable. BMET today.  4. CAD:  - No s/s of ischemia.    - Continue on statin.  He is not on ASA 81 given stable CAD and Eliquis use.   5. Carotid stenosis:  - [place] 04/2017 with 40-59% RICA. Repeat 04/2018  6. Diabetes:  - He is  taking saxagliptin, which is not ideal with CHF.  Would consider transitioning off this and onto empagliflozin with HF data.  7. OSA - Continue nightly CPAP.   Satira Mccallum Tillery 11/26/2017   Patient seen with PA, agree with the above note.   [name] is doing well today.  NYHA class II symptoms.  He is not volume overloaded. St Jude device interrogation shows > 99% BiV pacing, no atrial fibrillation, and stable thoracic impedance.    We will check labs today: CMET, TSH, digoxin level, CBC.   I will increase Coreg to 9.375 mg bid.    Followup in 3 months.   [name] 11/27/2017

## 2017-11-28 ENCOUNTER — Telehealth (HOSPITAL_COMMUNITY): Payer: Self-pay | Admitting: *Deleted

## 2017-11-28 DIAGNOSIS — I5022 Chronic systolic (congestive) heart failure: Secondary | ICD-10-CM

## 2017-11-28 MED ORDER — TORSEMIDE 20 MG PO TABS: 40.0000 mg | ORAL_TABLET | Freq: Every day | ORAL | 3 refills | Status: DC

## 2017-11-28 NOTE — Telephone Encounter (Signed)
-----   Message from Darron Doom, RN sent at 11/28/2017 10:04 AM EDT ----- Hulen Skains and spoke with patient but his Wife handles all his medications and asked if I could call back this afternoon as she was out.  I will forward to [facility], [name] and see if she can contact wife this afternoon.

## 2017-11-28 NOTE — Telephone Encounter (Signed)
Notes recorded by Harvie Junior, CMA on 11/28/2017 at 1:37 PM EDT Spoke with patients wife she is aware and agreeable with plan. Lab appt scheduled/ ------  Notes recorded by Darron Doom, RN on 11/28/2017 at 10:04 AM EDT Called and spoke with patient but his Wife handles all his medications and asked if I could call back this afternoon as she was out. I will forward to [facility] [place] and see if she can contact wife this afternoon. ------  Notes recorded by Larey Dresser, MD on 11/27/2017 at 10:09 AM EDT Decrease torsemide to 40 mg daily, repeat BMET 1 week.

## 2017-11-29 ENCOUNTER — Telehealth (INDEPENDENT_AMBULATORY_CARE_PROVIDER_SITE_OTHER): Payer: Self-pay | Admitting: Orthopaedic Surgery

## 2017-11-29 ENCOUNTER — Other Ambulatory Visit (INDEPENDENT_AMBULATORY_CARE_PROVIDER_SITE_OTHER): Payer: Self-pay

## 2017-11-29 DIAGNOSIS — G8929 Other chronic pain: Secondary | ICD-10-CM

## 2017-11-29 DIAGNOSIS — M25511 Pain in right shoulder: Principal | ICD-10-CM

## 2017-11-29 NOTE — Telephone Encounter (Signed)
That will be fine.  They Can work on his balance and coordination and strengthening.

## 2017-11-29 NOTE — Telephone Encounter (Signed)
See below

## 2017-11-29 NOTE — Telephone Encounter (Signed)
Sent order to PT

## 2017-11-29 NOTE — Telephone Encounter (Signed)
Patient called asked if he can be referred out for (PT) Patient said his cardiologist did not recommend surgery. The number to contact patient is [phone]

## 2017-12-05 ENCOUNTER — Other Ambulatory Visit (HOSPITAL_COMMUNITY): Payer: Self-pay

## 2017-12-05 ENCOUNTER — Ambulatory Visit (HOSPITAL_COMMUNITY)
Admission: RE | Admit: 2017-12-05 | Discharge: 2017-12-05 | Disposition: A | Discharge status: Home / Self Care | Payer: Medicare Other | Source: Ambulatory Visit | Attending: Internal Medicine | Admitting: Internal Medicine

## 2017-12-05 DIAGNOSIS — I5022 Chronic systolic (congestive) heart failure: Secondary | ICD-10-CM | POA: Insufficient documentation

## 2017-12-05 LAB — BASIC METABOLIC PANEL
Anion gap: 10 (ref 5–15)
BUN: 41 mg/dL — AB (ref 8–23)
CHLORIDE: 105 mmol/L (ref 98–111)
CO2: 25 mmol/L (ref 22–32)
CREATININE: 1.96 mg/dL — AB (ref 0.61–1.24)
Calcium: 9 mg/dL (ref 8.9–10.3)
GFR calc Af Amer: 34 mL/min — ABNORMAL LOW (ref >60)
GFR calc non Af Amer: 29 mL/min — ABNORMAL LOW (ref >60)
GLUCOSE: 201 mg/dL — AB (ref 70–99)
POTASSIUM: 4.2 mmol/L (ref 3.5–5.1)
Sodium: 140 mmol/L (ref 135–145)

## 2017-12-05 MED ORDER — DIGOXIN 125 MCG PO TABS: ORAL_TABLET | ORAL | 3 refills | Status: DC

## 2017-12-06 ENCOUNTER — Encounter: Payer: Self-pay | Admitting: Internal Medicine

## 2017-12-09 ENCOUNTER — Other Ambulatory Visit: Payer: Self-pay | Admitting: Internal Medicine

## 2017-12-09 LAB — CUP PACEART REMOTE DEVICE CHECK
Battery Remaining Longevity: 11 mo
Battery Remaining Percentage: 22 %
Brady Statistic AS VP Percent: 3.5 %
Brady Statistic AS VS Percent: 1 %
HIGH POWER IMPEDANCE MEASURED VALUE: 44 Ohm
HIGH POWER IMPEDANCE MEASURED VALUE: 45 Ohm
Implantable Lead Implant Date: 20150304
Implantable Lead Implant Date: 20150304
Implantable Lead Location: 753859
Implantable Pulse Generator Implant Date: 20150304
Lead Channel Impedance Value: 360 Ohm
Lead Channel Pacing Threshold Amplitude: 0.75 V
Lead Channel Sensing Intrinsic Amplitude: 1.5 mV
Lead Channel Setting Pacing Amplitude: 2.5 V
Lead Channel Setting Pacing Amplitude: 2.875
Lead Channel Setting Pacing Pulse Width: 1 ms
MDC IDC LEAD IMPLANT DT: 20051221
MDC IDC LEAD LOCATION: 753858
MDC IDC LEAD LOCATION: 753860
MDC IDC MSMT BATTERY VOLTAGE: 2.78 V
MDC IDC MSMT LEADCHNL LV IMPEDANCE VALUE: 410 Ohm
MDC IDC MSMT LEADCHNL LV PACING THRESHOLD AMPLITUDE: 1.875 V
MDC IDC MSMT LEADCHNL LV PACING THRESHOLD PULSEWIDTH: 1 ms
MDC IDC MSMT LEADCHNL RA IMPEDANCE VALUE: 360 Ohm
MDC IDC MSMT LEADCHNL RA PACING THRESHOLD PULSEWIDTH: 0.5 ms
MDC IDC MSMT LEADCHNL RV PACING THRESHOLD AMPLITUDE: 1.25 V
MDC IDC MSMT LEADCHNL RV PACING THRESHOLD PULSEWIDTH: 0.5 ms
MDC IDC MSMT LEADCHNL RV SENSING INTR AMPL: 12 mV
MDC IDC PG SERIAL: 7170478
MDC IDC SESS DTM: 20190814060016
MDC IDC SET LEADCHNL RA PACING AMPLITUDE: 2 V
MDC IDC SET LEADCHNL RV PACING PULSEWIDTH: 0.5 ms
MDC IDC SET LEADCHNL RV SENSING SENSITIVITY: 0.5 mV
MDC IDC STAT BRADY AP VP PERCENT: 96 %
MDC IDC STAT BRADY AP VS PERCENT: 1 %
MDC IDC STAT BRADY RA PERCENT PACED: 96 %

## 2017-12-09 NOTE — Progress Notes (Signed)
Is not on saxagltin and ahould have been off for wuite a while but was still on med list   Is on  semaglutide     And samples given when possible cause of cost . Seems to have much better control . Have rxed   jardiance  et al  because of  CI of low gfr. Whether bnefot more than risk in future is TBD

## 2017-12-10 ENCOUNTER — Ambulatory Visit: Payer: Medicare Other | Attending: Orthopaedic Surgery | Admitting: Physical Therapy

## 2017-12-10 ENCOUNTER — Other Ambulatory Visit: Payer: Self-pay

## 2017-12-10 DIAGNOSIS — M25611 Stiffness of right shoulder, not elsewhere classified: Secondary | ICD-10-CM | POA: Insufficient documentation

## 2017-12-10 DIAGNOSIS — G8929 Other chronic pain: Secondary | ICD-10-CM | POA: Insufficient documentation

## 2017-12-10 DIAGNOSIS — M25511 Pain in right shoulder: Secondary | ICD-10-CM | POA: Diagnosis not present
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
## 2017-12-10 NOTE — Patient Instructions (Signed)
Strengthening: Isometric Flexion  Using wall for resistance, press right fist into ball using light pressure. Hold __10__ seconds. Repeat __5_ times per set. Do ____ sets per session. Do _2___ sessions per day.  SHOULDER: Abduction (Isometric)  Use wall as resistance. Press arm against pillow. Keep elbow straight. Hold _10_ seconds. 5___ reps per set, 2___ sets per day, ___ days per week  Extension (Isometric)  Place left bent elbow and back of arm against wall. Press elbow against wall. Hold __10__ seconds. Repeat _5___ times. Do __2__ sessions per day.  Internal Rotation (Isometric)  Place palm of right fist against door frame, with elbow bent. Press fist against door frame. Hold _10___ seconds. Repeat __5__ times. Do __2__ sessions per day.  External Rotation (Isometric)  Place back of left fist against door frame, with elbow bent. Press fist against door frame. Hold __10__ seconds. Repeat _5___ times. Do ___2_ sessions per day.  Copyright  VHI. All rights reserved.   Scapular Retraction (Standing)   With arms at sides, pinch shoulder blades together. Repeat 10 times per set. Do 1-3 sets per session. Do 2 sessions per day.   Posture - Sitting   Sit upright, head facing forward. Try using a roll to support lower back. Keep shoulders relaxed, and avoid rounded back. Keep hips level with knees. Avoid crossing legs for long periods.  Madelyn Flavors, PT 12/10/17 11:47 AM [address] [place], [place], [address] Phone: [phone]   Fax:  [phone]

## 2017-12-13 ENCOUNTER — Ambulatory Visit: Payer: Medicare Other | Admitting: Physical Therapy

## 2017-12-13 ENCOUNTER — Encounter: Payer: Self-pay | Admitting: Physical Therapy

## 2017-12-13 DIAGNOSIS — M25611 Stiffness of right shoulder, not elsewhere classified: Secondary | ICD-10-CM

## 2017-12-13 DIAGNOSIS — M25511 Pain in right shoulder: Secondary | ICD-10-CM | POA: Diagnosis not present

## 2017-12-13 DIAGNOSIS — G8929 Other chronic pain: Secondary | ICD-10-CM | POA: Diagnosis not present
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
## 2017-12-17 ENCOUNTER — Ambulatory Visit: Payer: Medicare Other | Attending: Orthopaedic Surgery | Admitting: Physical Therapy

## 2017-12-17 ENCOUNTER — Other Ambulatory Visit: Payer: Self-pay | Admitting: Cardiology

## 2017-12-17 ENCOUNTER — Encounter: Payer: Self-pay | Admitting: Physical Therapy

## 2017-12-17 DIAGNOSIS — M25511 Pain in right shoulder: Secondary | ICD-10-CM | POA: Diagnosis not present

## 2017-12-17 DIAGNOSIS — M25611 Stiffness of right shoulder, not elsewhere classified: Secondary | ICD-10-CM | POA: Diagnosis not present

## 2017-12-17 DIAGNOSIS — G8929 Other chronic pain: Secondary | ICD-10-CM | POA: Diagnosis not present
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
## 2017-12-19 ENCOUNTER — Ambulatory Visit: Payer: Medicare Other | Admitting: Physical Therapy

## 2017-12-19 DIAGNOSIS — G8929 Other chronic pain: Secondary | ICD-10-CM

## 2017-12-19 DIAGNOSIS — M25611 Stiffness of right shoulder, not elsewhere classified: Secondary | ICD-10-CM | POA: Diagnosis not present

## 2017-12-19 DIAGNOSIS — M25511 Pain in right shoulder: Principal | ICD-10-CM
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
## 2017-12-24 ENCOUNTER — Ambulatory Visit: Payer: Medicare Other | Admitting: Physical Therapy

## 2017-12-24 ENCOUNTER — Encounter: Payer: Self-pay | Admitting: Physical Therapy

## 2017-12-24 ENCOUNTER — Encounter: Payer: Medicare Other | Admitting: Physical Therapy

## 2017-12-24 DIAGNOSIS — G8929 Other chronic pain: Secondary | ICD-10-CM

## 2017-12-24 DIAGNOSIS — M25511 Pain in right shoulder: Principal | ICD-10-CM

## 2017-12-24 DIAGNOSIS — M25611 Stiffness of right shoulder, not elsewhere classified: Secondary | ICD-10-CM | POA: Diagnosis not present
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
## 2017-12-26 ENCOUNTER — Encounter: Payer: Medicare Other | Admitting: Physical Therapy

## 2017-12-27 ENCOUNTER — Ambulatory Visit: Payer: Medicare Other | Admitting: Physical Therapy

## 2017-12-27 ENCOUNTER — Encounter: Payer: Self-pay | Admitting: Physical Therapy

## 2017-12-27 DIAGNOSIS — M25611 Stiffness of right shoulder, not elsewhere classified: Secondary | ICD-10-CM | POA: Diagnosis not present

## 2017-12-27 DIAGNOSIS — M25511 Pain in right shoulder: Secondary | ICD-10-CM | POA: Diagnosis not present

## 2017-12-27 DIAGNOSIS — G8929 Other chronic pain: Secondary | ICD-10-CM | POA: Diagnosis not present
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
## 2018-01-17 ENCOUNTER — Ambulatory Visit: Payer: Medicare Other | Admitting: Podiatry

## 2018-01-17 DIAGNOSIS — Z85828 Personal history of other malignant neoplasm of skin: Secondary | ICD-10-CM | POA: Diagnosis not present

## 2018-01-17 DIAGNOSIS — L905 Scar conditions and fibrosis of skin: Secondary | ICD-10-CM | POA: Diagnosis not present

## 2018-01-20 ENCOUNTER — Encounter: Payer: Self-pay | Admitting: Internal Medicine

## 2018-01-21 ENCOUNTER — Encounter: Payer: Self-pay | Admitting: Podiatry

## 2018-01-21 ENCOUNTER — Ambulatory Visit (INDEPENDENT_AMBULATORY_CARE_PROVIDER_SITE_OTHER): Payer: Medicare Other | Admitting: Podiatry

## 2018-01-21 DIAGNOSIS — E1140 Type 2 diabetes mellitus with diabetic neuropathy, unspecified: Secondary | ICD-10-CM | POA: Diagnosis not present

## 2018-01-21 DIAGNOSIS — B351 Tinea unguium: Secondary | ICD-10-CM | POA: Diagnosis not present

## 2018-01-21 DIAGNOSIS — M216X9 Other acquired deformities of unspecified foot: Secondary | ICD-10-CM | POA: Diagnosis not present

## 2018-01-21 DIAGNOSIS — M79676 Pain in unspecified toe(s): Secondary | ICD-10-CM

## 2018-01-21 DIAGNOSIS — D689 Coagulation defect, unspecified: Secondary | ICD-10-CM | POA: Diagnosis not present

## 2018-01-21 DIAGNOSIS — Q828 Other specified congenital malformations of skin: Secondary | ICD-10-CM

## 2018-01-21 NOTE — Progress Notes (Signed)
Patient ID: [name], male   DOB: Jul 21, 1928, 84 y.o.   MRN: [id number] Complaint:  Visit Type: Patient returns to my office for continued preventative foot care services. Complaint: Patient states" my nails have grown long and thick and become painful to walk and wear shoes" Patient has been diagnosed with DM with neuropathy.[name] He presents for preventative foot care services. No changes to ROS.  He also has painful callus under the outside ball of both feet.  Podiatric Exam: Vascular: dorsalis pedis and posterior tibial pulses are palpable bilateral. Capillary return is immediate. Temperature gradient is WNL. Skin turgor WNL  Sensorium: Diminished  Semmes Weinstein monofilament test. Normal tactile sensation bilaterally. Nail Exam: Pt has thick disfigured discolored nails with subungual debris noted bilateral entire nail hallux through fifth toenails Ulcer Exam: There is no evidence of ulcer or pre-ulcerative changes or infection. Orthopedic Exam: Muscle tone and strength are WNL. No limitations in general ROM. No crepitus or effusions noted. Foot type and digits show no abnormalities. Bony prominences are unremarkable. Asymptomatic HAV B/L. Skin:  Porokeratosis sub 5th metatarsal bilateral.. No infection or ulcers.   Diagnosis:  Tinea unguium, Pain in right toe, pain in left toes , Porokeratosis sub 5th  B/L  Treatment & Plan Procedures and Treatment: Consent by patient was obtained for treatment procedures. The patient understood the discussion of treatment and procedures well. All questions were answered thoroughly reviewed. Debridement of mycotic and hypertrophic toenails, 1 through 5 bilateral and clearing of subungual debris. No ulceration, no infection noted.  ABN signed for 2019. Return Visit-Office Procedure: Patient instructed to return to the office for a follow up visit 10 weeks  for continued evaluation and treatment.   Gardiner Barefoot DPM

## 2018-01-27 ENCOUNTER — Ambulatory Visit (INDEPENDENT_AMBULATORY_CARE_PROVIDER_SITE_OTHER): Payer: Medicare Other | Admitting: Physician Assistant

## 2018-01-27 ENCOUNTER — Encounter (INDEPENDENT_AMBULATORY_CARE_PROVIDER_SITE_OTHER): Payer: Self-pay | Admitting: Orthopedic Surgery

## 2018-01-27 VITALS — Ht 74.0 in | Wt 191.0 lb

## 2018-01-27 DIAGNOSIS — I255 Ischemic cardiomyopathy: Secondary | ICD-10-CM

## 2018-01-27 DIAGNOSIS — M19019 Primary osteoarthritis, unspecified shoulder: Secondary | ICD-10-CM

## 2018-01-27 DIAGNOSIS — G8929 Other chronic pain: Secondary | ICD-10-CM

## 2018-01-27 DIAGNOSIS — M25511 Pain in right shoulder: Principal | ICD-10-CM

## 2018-01-28 ENCOUNTER — Ambulatory Visit (INDEPENDENT_AMBULATORY_CARE_PROVIDER_SITE_OTHER): Payer: Medicare Other

## 2018-01-28 DIAGNOSIS — Z23 Encounter for immunization: Secondary | ICD-10-CM | POA: Diagnosis not present

## 2018-01-29 ENCOUNTER — Encounter (INDEPENDENT_AMBULATORY_CARE_PROVIDER_SITE_OTHER): Payer: Self-pay | Admitting: Physician Assistant

## 2018-01-29 DIAGNOSIS — M25511 Pain in right shoulder: Secondary | ICD-10-CM | POA: Diagnosis not present

## 2018-01-29 DIAGNOSIS — M19019 Primary osteoarthritis, unspecified shoulder: Secondary | ICD-10-CM | POA: Diagnosis not present

## 2018-01-29 DIAGNOSIS — G8929 Other chronic pain: Secondary | ICD-10-CM | POA: Diagnosis not present

## 2018-01-29 MED ORDER — METHYLPREDNISOLONE ACETATE 40 MG/ML IJ SUSP
40.0000 mg | INTRAMUSCULAR | Status: AC | PRN
  Administered 2018-01-29: 40 mg via INTRA_ARTICULAR

## 2018-01-29 MED ORDER — LIDOCAINE HCL 1 % IJ SOLN
5.0000 mL | INTRAMUSCULAR | Status: AC | PRN
  Administered 2018-01-29: 5 mL
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
## 2018-01-30 ENCOUNTER — Ambulatory Visit (INDEPENDENT_AMBULATORY_CARE_PROVIDER_SITE_OTHER): Payer: Medicare Other | Admitting: *Deleted

## 2018-01-30 DIAGNOSIS — I5022 Chronic systolic (congestive) heart failure: Secondary | ICD-10-CM | POA: Diagnosis not present

## 2018-01-30 DIAGNOSIS — I255 Ischemic cardiomyopathy: Secondary | ICD-10-CM

## 2018-01-30 NOTE — Progress Notes (Signed)
Remote ICD transmission.   

## 2018-01-31 ENCOUNTER — Other Ambulatory Visit: Payer: Self-pay

## 2018-02-03 ENCOUNTER — Encounter: Payer: Medicare Other | Admitting: Internal Medicine

## 2018-02-10 ENCOUNTER — Ambulatory Visit (INDEPENDENT_AMBULATORY_CARE_PROVIDER_SITE_OTHER): Payer: Medicare Other | Admitting: Internal Medicine

## 2018-02-10 ENCOUNTER — Encounter: Payer: Self-pay | Admitting: Internal Medicine

## 2018-02-10 VITALS — BP 114/60 | HR 66 | Ht 74.0 in | Wt 189.4 lb

## 2018-02-10 DIAGNOSIS — I255 Ischemic cardiomyopathy: Secondary | ICD-10-CM | POA: Diagnosis not present

## 2018-02-10 DIAGNOSIS — I5022 Chronic systolic (congestive) heart failure: Secondary | ICD-10-CM

## 2018-02-10 DIAGNOSIS — I447 Left bundle-branch block, unspecified: Secondary | ICD-10-CM | POA: Diagnosis not present

## 2018-02-10 DIAGNOSIS — I4891 Unspecified atrial fibrillation: Secondary | ICD-10-CM

## 2018-02-10 DIAGNOSIS — Z95810 Presence of automatic (implantable) cardiac defibrillator: Secondary | ICD-10-CM

## 2018-02-10 LAB — CUP PACEART INCLINIC DEVICE CHECK
Battery Remaining Longevity: 8 mo
Brady Statistic RA Percent Paced: 96 %
HighPow Impedance: 45.1205
Implantable Lead Implant Date: 20150304
Implantable Lead Implant Date: 20150304
Implantable Lead Location: 753859
Implantable Lead Location: 753860
Implantable Lead Model: 1581
Implantable Pulse Generator Implant Date: 20150304
Lead Channel Impedance Value: 375 Ohm
Lead Channel Impedance Value: 437.5 Ohm
Lead Channel Pacing Threshold Amplitude: 1 V
Lead Channel Pacing Threshold Amplitude: 1 V
Lead Channel Pacing Threshold Pulse Width: 0.5 ms
Lead Channel Pacing Threshold Pulse Width: 0.5 ms
Lead Channel Pacing Threshold Pulse Width: 0.5 ms
Lead Channel Pacing Threshold Pulse Width: 1 ms
Lead Channel Sensing Intrinsic Amplitude: 2.5 mV
Lead Channel Setting Pacing Amplitude: 2 V
Lead Channel Setting Pacing Amplitude: 2.5 V
Lead Channel Setting Pacing Amplitude: 2.875
Lead Channel Setting Pacing Pulse Width: 0.5 ms
Lead Channel Setting Sensing Sensitivity: 0.5 mV
MDC IDC LEAD IMPLANT DT: 20051221
MDC IDC LEAD LOCATION: 753858
MDC IDC MSMT LEADCHNL LV PACING THRESHOLD AMPLITUDE: 2 V
MDC IDC MSMT LEADCHNL LV PACING THRESHOLD AMPLITUDE: 2 V
MDC IDC MSMT LEADCHNL LV PACING THRESHOLD PULSEWIDTH: 1 ms
MDC IDC MSMT LEADCHNL RA IMPEDANCE VALUE: 375 Ohm
MDC IDC MSMT LEADCHNL RA PACING THRESHOLD AMPLITUDE: 1 V
MDC IDC MSMT LEADCHNL RV PACING THRESHOLD AMPLITUDE: 1 V
MDC IDC MSMT LEADCHNL RV PACING THRESHOLD PULSEWIDTH: 0.5 ms
MDC IDC MSMT LEADCHNL RV SENSING INTR AMPL: 12 mV
MDC IDC PG SERIAL: 7170478
MDC IDC SESS DTM: 20191125162016
MDC IDC SET LEADCHNL LV PACING PULSEWIDTH: 1 ms
MDC IDC STAT BRADY RV PERCENT PACED: 99.4 %

## 2018-02-10 NOTE — Patient Instructions (Signed)
  Medication Instructions:  Your physician recommends that you continue on your current medications as directed. Please refer to the Current Medication list given to you today.  Labwork: None ordered.  Testing/Procedures: None ordered.  Follow-Up: Your physician recommends that you schedule a follow-up appointment in:   One Year with Chanetta Marshall, NP  Any Other Special Instructions Will Be Listed Below (If Applicable).     If you need a refill on your cardiac medications before your next appointment, please call your pharmacy.
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
## 2018-02-28 ENCOUNTER — Other Ambulatory Visit (HOSPITAL_COMMUNITY): Payer: Self-pay

## 2018-02-28 ENCOUNTER — Encounter (HOSPITAL_COMMUNITY): Payer: Self-pay | Admitting: Cardiology

## 2018-02-28 ENCOUNTER — Ambulatory Visit (HOSPITAL_COMMUNITY)
Admission: RE | Admit: 2018-02-28 | Discharge: 2018-02-28 | Disposition: A | Discharge status: Home / Self Care | Payer: Medicare Other | Source: Ambulatory Visit | Attending: Cardiology | Admitting: Cardiology

## 2018-02-28 VITALS — BP 130/58 | HR 73 | Wt 191.2 lb

## 2018-02-28 DIAGNOSIS — I5022 Chronic systolic (congestive) heart failure: Secondary | ICD-10-CM | POA: Insufficient documentation

## 2018-02-28 DIAGNOSIS — Z8249 Family history of ischemic heart disease and other diseases of the circulatory system: Secondary | ICD-10-CM | POA: Diagnosis not present

## 2018-02-28 DIAGNOSIS — I6521 Occlusion and stenosis of right carotid artery: Secondary | ICD-10-CM | POA: Diagnosis not present

## 2018-02-28 DIAGNOSIS — M25511 Pain in right shoulder: Secondary | ICD-10-CM | POA: Insufficient documentation

## 2018-02-28 DIAGNOSIS — Z794 Long term (current) use of insulin: Secondary | ICD-10-CM | POA: Insufficient documentation

## 2018-02-28 DIAGNOSIS — F329 Major depressive disorder, single episode, unspecified: Secondary | ICD-10-CM | POA: Diagnosis not present

## 2018-02-28 DIAGNOSIS — Z951 Presence of aortocoronary bypass graft: Secondary | ICD-10-CM | POA: Diagnosis not present

## 2018-02-28 DIAGNOSIS — G4733 Obstructive sleep apnea (adult) (pediatric): Secondary | ICD-10-CM | POA: Insufficient documentation

## 2018-02-28 DIAGNOSIS — N183 Chronic kidney disease, stage 3 (moderate): Secondary | ICD-10-CM | POA: Insufficient documentation

## 2018-02-28 DIAGNOSIS — I2510 Atherosclerotic heart disease of native coronary artery without angina pectoris: Secondary | ICD-10-CM

## 2018-02-28 DIAGNOSIS — Z87891 Personal history of nicotine dependence: Secondary | ICD-10-CM | POA: Diagnosis not present

## 2018-02-28 DIAGNOSIS — G2581 Restless legs syndrome: Secondary | ICD-10-CM | POA: Diagnosis not present

## 2018-02-28 DIAGNOSIS — I255 Ischemic cardiomyopathy: Secondary | ICD-10-CM | POA: Insufficient documentation

## 2018-02-28 DIAGNOSIS — Z7901 Long term (current) use of anticoagulants: Secondary | ICD-10-CM | POA: Diagnosis not present

## 2018-02-28 DIAGNOSIS — E1122 Type 2 diabetes mellitus with diabetic chronic kidney disease: Secondary | ICD-10-CM | POA: Diagnosis not present

## 2018-02-28 DIAGNOSIS — I480 Paroxysmal atrial fibrillation: Secondary | ICD-10-CM | POA: Diagnosis not present

## 2018-02-28 DIAGNOSIS — I130 Hypertensive heart and chronic kidney disease with heart failure and stage 1 through stage 4 chronic kidney disease, or unspecified chronic kidney disease: Secondary | ICD-10-CM | POA: Diagnosis not present

## 2018-02-28 DIAGNOSIS — E785 Hyperlipidemia, unspecified: Secondary | ICD-10-CM | POA: Diagnosis not present

## 2018-02-28 LAB — COMPREHENSIVE METABOLIC PANEL
ALK PHOS: 57 U/L (ref 38–126)
ALT: 13 U/L (ref 0–44)
AST: 17 U/L (ref 15–41)
Albumin: 3.4 g/dL — ABNORMAL LOW (ref 3.5–5.0)
Anion gap: 12 (ref 5–15)
BILIRUBIN TOTAL: 0.7 mg/dL (ref 0.3–1.2)
BUN: 38 mg/dL — AB (ref 8–23)
CALCIUM: 9.1 mg/dL (ref 8.9–10.3)
CO2: 25 mmol/L (ref 22–32)
Chloride: 105 mmol/L (ref 98–111)
Creatinine, Ser: 1.93 mg/dL — ABNORMAL HIGH (ref 0.61–1.24)
GFR calc Af Amer: 35 mL/min — ABNORMAL LOW (ref >60)
GFR, EST NON AFRICAN AMERICAN: 30 mL/min — AB (ref >60)
GLUCOSE: 158 mg/dL — AB (ref 70–99)
Potassium: 4.2 mmol/L (ref 3.5–5.1)
Sodium: 142 mmol/L (ref 135–145)
TOTAL PROTEIN: 6.1 g/dL — AB (ref 6.5–8.1)

## 2018-02-28 LAB — LIPID PANEL
Cholesterol: 84 mg/dL (ref 0–200)
HDL: 35 mg/dL — ABNORMAL LOW (ref >40)
LDL CALC: 24 mg/dL (ref 0–99)
Total CHOL/HDL Ratio: 2.4 RATIO
Triglycerides: 127 mg/dL (ref <150)
VLDL: 25 mg/dL (ref 0–40)

## 2018-02-28 LAB — DIGOXIN LEVEL: DIGOXIN LVL: 0.7 ng/mL — AB (ref 0.8–2.0)

## 2018-02-28 LAB — TSH: TSH: 1.938 u[IU]/mL (ref 0.350–4.500)

## 2018-02-28 MED ORDER — TORSEMIDE 20 MG PO TABS: 40.0000 mg | ORAL_TABLET | Freq: Every day | ORAL | 1 refills | Status: DC

## 2018-02-28 MED ORDER — CARVEDILOL 12.5 MG PO TABS: 12.5000 mg | ORAL_TABLET | Freq: Two times a day (BID) | ORAL | 3 refills | Status: DC

## 2018-02-28 NOTE — Patient Instructions (Signed)
INCREASE Coreg to 12.5mg  (1 tab) twice day  Labs today We will only contact you if something comes back abnormal or we need to make some changes. Otherwise no news is good news!  Your physician recommends that you schedule a follow-up appointment in: 3 months with Dr. [name]

## 2018-03-02 NOTE — Progress Notes (Signed)
Patient ID: GLENFORD GARIS, male   DOB: April 04, 1928, 84 y.o.   MRN: [id number]     Advanced Heart Failure Clinic Note PCP: Dr. Regis Bill Cardiology: Dr. Mariella Saa is a 84 y.o. male  with history of CAD s/p CABG, ischemic cardiomyopathy, and paroxysmal atrial fibrillation presents for followup.  Patient was admitted in 2/16 with CHF and diuresed, he was sent home on Lasix 60 mg bid.  He missed 3 days of the pm Lasix dose and was re-admitted on 05/18/14 with acute on chronic systolic CHF with dyspnea and hypoxemia. He was diuresed with Lasix gtt and metolazone.  While in the hospital, he went into atrial fibrillation with RVR requiring cardioversion.  Creatinine was elevated and ramipril was stopped.  We had to cut back on Coreg due to hypotension and low output.  He was begun on low dose digoxin.    He presents today for followup of CHF.   Main complaint is ongoing shoulder pain on the right. Injection did not help much.  He can climb a flight of stairs without problems.  No problems walking on flat ground.  He is short of breath with heavy exertion.  No chest pain. No orthopnea/PND.  No palpitations, he is in NSR today.   St Jude device interrogation: No VT, no AF, 95% a-pacing, >99% BiV pacing, stable thoracic impedance.   Labs (3/16): K 4.2, creatinine 2.26, digoxin < 0.2 Labs (05/31/2014): TSH 2.3 Dig level 0.6  Labs (06/14/2014): K 4.5 Creatinine 2.14, LDL 28, HDL 46 Labs (6/16): K 4.5, creatinine 2.08, BNP 712 Labs (7/16): K 4.1, creatinine 2.18, HCT 36.8, LFTs normal, digoxin 0.9 Labs (10/16): LDL 26, HDL 38, TSH normal, LFTs normal Labs (12/16): K 4.6, creatinine 2 Labs (2/17): LFTs normal, digoxin 1.1 Labs (4/17): K 4.7, creatinine 1.97, BUN 50, hgb 12.8, TSH normal, digoxin 0.8, LDL 29 Labs (7/17): digoxin 0.6 Labs (11/17): K 4.4, creatinine 2.13 Labs (12/17): hgb 11.4 Labs (5/18): LFTs normal, K 4.6, creatinne 2.21, hgb 10.9 Labs (7/19): Cr 1.97, K 4.4, Hgb 13.2, Hgb A1c 7.1.  LFTs normal.  LDL 25 Labs (9/19): K 4.2, creatinine 1.96  PMH: 1. CAD: S/p CABG 2005 with LIMA-Diagonal, SVG-LAD, SVG-LCx, SVG-RCA.  Cardiolite in 2/15 with EF 19%, no ischemia.  2. Ischemic cardiomyopathy: Echo (2/16) with EF 20-25%, severe LV dilation with RWMAs, moderate AI, moderate MR, moderately decreased RV systolic function.  St Jude CRT-D device.  - Echo 10/2016 LVEF 20-25% Grade 1 DD, Mild/Mod MR, mild LAE, Mildly reduced RV function, Mild RAE, Peak PA pressure 32 mmHg 3. Atrial fibrillation: Paroxysmal.  DCCV 3/16.  4. CKD 5. Carotid stenosis: Carotid dopplers (1/16) with 60-79% RICA stenosis.  Carotids (1/17) with bilateral 40-59% ICA stenosis.  - Carotids (1/18): 40-59% BICA stenosis.  6. HTN 7. Hyperlipidemia 8. Restless leg syndrome. 9. Type 2 diabetes. 10. OA 11. Depression 12. H/o CCY 39. H/o appy 14. OSA: Uses CPAP.  15. ABIs (7/17): Normal 16. Melanoma: s/p excision.   SH: Married, quit smoking 1980, [place] [employer] vet  FH: CAD  Review of systems complete and found to be negative unless listed in HPI.    Current Outpatient Medications  Medication Sig Dispense Refill  . acetaminophen (TYLENOL) 500 MG tablet Take 500 mg by mouth daily as needed for mild pain.    [name] amiodarone (PACERONE) 200 MG tablet Take 0.5 tablets (100 mg total) by mouth daily. 45 tablet 3  . apixaban (ELIQUIS) 2.5 MG TABS tablet Take  1 tablet (2.5 mg total) by mouth 2 (two) times daily. 270 tablet 3  . atorvastatin (LIPITOR) 40 MG tablet Take 1 tablet (40 mg total) by mouth daily. 90 tablet 3  . Blood Glucose Monitoring Suppl (ACCU-CHEK AVIVA PLUS) w/Device KIT Check blood sugars daily up to three times daily or as needed. 1 kit prn  . carvedilol (COREG) 12.5 MG tablet Take 1 tablet (12.5 mg total) by mouth 2 (two) times daily. 180 tablet 3  . digoxin (DIGOX) 0.125 MG tablet TAKE 1/2 TABLET BY MOUTH DAILY 45 tablet 3  . escitalopram (LEXAPRO) 10 MG tablet Take 1 tablet (10 mg total) by mouth  daily. 90 tablet 3  . gabapentin (NEURONTIN) 100 MG capsule Take 1 capsule (100 mg total) by mouth 3 (three) times daily. Can increase as directed 90 capsule 3  . glipiZIDE (GLUCOTROL) 10 MG tablet Take 1 tablet (10 mg total) by mouth daily before breakfast. 90 tablet 3  . glucose blood (ACCU-CHEK AVIVA) test strip Use as instructed 100 each 12  . hydrALAZINE (APRESOLINE) 25 MG tablet Take 1 tablet (25 mg total) by mouth every 8 (eight) hours. 270 tablet 3  . Insulin Detemir (LEVEMIR FLEXTOUCH) 100 UNIT/ML Pen Inject into the skin.    [name] insulin glargine (LANTUS) 100 unit/mL SOPN Inject 30 Units into the skin daily.    . Insulin Pen Needle (BD PEN NEEDLE NANO U/F) 32G X 4 MM MISC [place] TO TEST BLOOD SUGAR THREE TIMES DAILY 300 each 1  . isosorbide mononitrate (IMDUR) 30 MG 24 hr tablet Take 1 tablet (30 mg total) by mouth daily. 90 tablet 3  . Menthol, Topical Analgesic, (ICY HOT EX) Apply 1 application topically at bedtime as needed (leg pain).    . Multiple Vitamin (MULTIVITAMIN WITH MINERALS) TABS tablet Take 1 tablet by mouth daily.    Glory Rosebush DELICA LANCETS FINE MISC Use to test blood sugar 2-3 times daily 300 each 1  . polyethylene glycol (MIRALAX) packet Take 17 g by mouth daily. 14 each 0  . potassium chloride SA (KLOR-CON M20) 20 MEQ tablet TAKE 2 TABLETS (40MEQ TOTAL) DAILY 180 tablet 3  . pramipexole (MIRAPEX) 0.5 MG tablet TAKE 0.79m 2-3 HOURS BEFORE SLEEPFOR RESTLESS LEG 90 tablet 3  . Semaglutide (OZEMPIC) 0.25 or 0.5 MG/DOSE SOPN Inject 0.5 mg into the skin once a week. 1.5 mL 3  . silodosin (RAPAFLO) 8 MG CAPS capsule TAKE 1 CAPSULE DAILY WITH  BREAKFAST 90 capsule 3  . spironolactone (ALDACTONE) 25 MG tablet Take 1 tablet (25 mg total) by mouth daily. 90 tablet 3  . torsemide (DEMADEX) 20 MG tablet Take 2 tablets (40 mg total) by mouth daily. 180 tablet 1   No current facility-administered medications for this encounter.    BP (!) 130/58   Pulse 73   Wt 86.7 kg (191 lb 3.2 oz)    SpO2 98%   BMI 24.55 kg/m    Wt Readings from Last 3 Encounters:  02/28/18 86.7 kg (191 lb 3.2 oz)  02/10/18 85.9 kg (189 lb 6.4 oz)  01/27/18 86.6 kg (191 lb)    General: NAD Neck: No JVD, no thyromegaly or thyroid nodule.  Lungs: Clear to auscultation bilaterally with normal respiratory effort. CV: Nondisplaced PMI.  Heart regular S1/S2, no S3/S4, no murmur.  No peripheral edema.  No carotid bruit.  Normal pedal pulses.  Abdomen: Soft, nontender, no hepatosplenomegaly, no distention.  Skin: Intact without lesions or rashes.  Neurologic: Alert and oriented x  3.  Psych: Normal affect. Extremities: No clubbing or cyanosis.  HEENT: Normal.   Assessment/Plan: 1. Chronic systolic CHF: Ischemic cardiomyopathy.  St Jude CRT-D.  Echo 10/2016 LVEF 20-25%. NYHA class II symptoms.  He is not volume overloaded.  - Continue torsemide 40 mg daily.  Check BMET.     - Continue current hydralazine/Imdur  - Will leave off ACEI/ARB/ARNI for now with elevated creatinine.  - Continue current spironolactone.  - Continue current digoxin, check level today.   - Increase Coreg to 12.5 mg bid.  2. Atrial fibrillation: Paroxysmal.  ICD interrogation showed no atrial fibrillation.   - Continue amiodarone 100 mg daily.  Check TSH and LFTs today.  He will need a regular eye exam.   - Continue Eliquis (dosed at 2.5 mg bid with age and renal dysfunction).   3. CKD: Stage 3.  - BMET today.  4. CAD: No chest pain.  - Continue on statin, check lipids today.   - He is not on ASA 81 given stable CAD and Eliquis use.   5. Carotid stenosis: [place] 04/2017 with 40-59% RICA.  - Repeat carotid dopplers 2/20.  6. Diabetes: He is taking saxagliptin, which is not ideal with CHF.  Would consider transitioning off this.  7. OSA: Continue nightly CPAP.   Followup 3 months.   [name] 03/02/2018
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
## 2018-03-26 ENCOUNTER — Encounter: Payer: Self-pay | Admitting: Internal Medicine

## 2018-03-26 ENCOUNTER — Ambulatory Visit (INDEPENDENT_AMBULATORY_CARE_PROVIDER_SITE_OTHER): Payer: Medicare Other | Admitting: Internal Medicine

## 2018-03-26 ENCOUNTER — Ambulatory Visit (INDEPENDENT_AMBULATORY_CARE_PROVIDER_SITE_OTHER): Payer: Medicare Other

## 2018-03-26 VITALS — BP 120/66 | HR 75 | Temp 97.9°F | Wt 191.8 lb

## 2018-03-26 DIAGNOSIS — R5381 Other malaise: Secondary | ICD-10-CM | POA: Diagnosis not present

## 2018-03-26 DIAGNOSIS — N183 Chronic kidney disease, stage 3 unspecified: Secondary | ICD-10-CM

## 2018-03-26 DIAGNOSIS — R5383 Other fatigue: Secondary | ICD-10-CM | POA: Diagnosis not present

## 2018-03-26 DIAGNOSIS — E1140 Type 2 diabetes mellitus with diabetic neuropathy, unspecified: Secondary | ICD-10-CM

## 2018-03-26 DIAGNOSIS — R05 Cough: Secondary | ICD-10-CM | POA: Diagnosis not present

## 2018-03-26 DIAGNOSIS — Z794 Long term (current) use of insulin: Secondary | ICD-10-CM

## 2018-03-26 DIAGNOSIS — R059 Cough, unspecified: Secondary | ICD-10-CM

## 2018-03-26 LAB — CBC WITH DIFFERENTIAL/PLATELET
BASOS PCT: 0.1 % (ref 0.0–3.0)
Basophils Absolute: 0 10*3/uL (ref 0.0–0.1)
EOS PCT: 0.4 % (ref 0.0–5.0)
Eosinophils Absolute: 0.1 10*3/uL (ref 0.0–0.7)
HCT: 40.8 % (ref 39.0–52.0)
Hemoglobin: 13.4 g/dL (ref 13.0–17.0)
LYMPHS ABS: 2.2 10*3/uL (ref 0.7–4.0)
Lymphocytes Relative: 15.1 % (ref 12.0–46.0)
MCHC: 32.8 g/dL (ref 30.0–36.0)
MCV: 96 fl (ref 78.0–100.0)
MONO ABS: 0.8 10*3/uL (ref 0.1–1.0)
MONOS PCT: 5.2 % (ref 3.0–12.0)
NEUTROS ABS: 11.7 10*3/uL — AB (ref 1.4–7.7)
NEUTROS PCT: 79.2 % — AB (ref 43.0–77.0)
Platelets: 185 10*3/uL (ref 150.0–400.0)
RBC: 4.25 Mil/uL (ref 4.22–5.81)
RDW: 13.6 % (ref 11.5–15.5)
WBC: 14.7 10*3/uL — ABNORMAL HIGH (ref 4.0–10.5)

## 2018-03-26 LAB — BASIC METABOLIC PANEL
BUN: 32 mg/dL — AB (ref 6–23)
CHLORIDE: 104 meq/L (ref 96–112)
CO2: 28 meq/L (ref 19–32)
Calcium: 9.1 mg/dL (ref 8.4–10.5)
Creatinine, Ser: 1.82 mg/dL — ABNORMAL HIGH (ref 0.40–1.50)
GFR: 37.57 mL/min — ABNORMAL LOW (ref >60.00)
GLUCOSE: 213 mg/dL — AB (ref 70–99)
POTASSIUM: 4.6 meq/L (ref 3.5–5.1)
SODIUM: 141 meq/L (ref 135–145)

## 2018-03-26 LAB — HEMOGLOBIN A1C: Hgb A1c MFr Bld: 7.7 % — ABNORMAL HIGH (ref 4.6–6.5)

## 2018-03-26 NOTE — Patient Instructions (Signed)
Your  Chest exam and chest  X ray is normal today  Lets wait for the lab work and contact us before the weekend .  About how you are doing .  consdier antibiotic   mucinex plain  And hot tea liquid   Honey  Ok to help cough .

## 2018-03-27 ENCOUNTER — Other Ambulatory Visit: Payer: Self-pay | Admitting: Internal Medicine

## 2018-03-27 MED ORDER — DOXYCYCLINE HYCLATE 100 MG PO TABS: 100.0000 mg | ORAL_TABLET | Freq: Two times a day (BID) | ORAL | 0 refills | Status: DC

## 2018-04-01 LAB — CUP PACEART REMOTE DEVICE CHECK
Battery Remaining Percentage: 17 %
Battery Voltage: 2.75 V
Brady Statistic AP VP Percent: 96 %
Brady Statistic AS VP Percent: 3.6 %
Date Time Interrogation Session: 20191113090016
HIGH POWER IMPEDANCE MEASURED VALUE: 45 Ohm
HighPow Impedance: 44 Ohm
Implantable Lead Implant Date: 20150304
Lead Channel Impedance Value: 360 Ohm
Lead Channel Pacing Threshold Amplitude: 1.875 V
Lead Channel Pacing Threshold Pulse Width: 0.5 ms
Lead Channel Pacing Threshold Pulse Width: 0.5 ms
Lead Channel Pacing Threshold Pulse Width: 1 ms
Lead Channel Sensing Intrinsic Amplitude: 1.7 mV
Lead Channel Sensing Intrinsic Amplitude: 12 mV
Lead Channel Setting Pacing Amplitude: 2 V
Lead Channel Setting Pacing Amplitude: 2.5 V
Lead Channel Setting Pacing Amplitude: 2.875
Lead Channel Setting Sensing Sensitivity: 0.5 mV
MDC IDC LEAD IMPLANT DT: 20051221
MDC IDC LEAD IMPLANT DT: 20150304
MDC IDC LEAD LOCATION: 753858
MDC IDC LEAD LOCATION: 753859
MDC IDC LEAD LOCATION: 753860
MDC IDC MSMT BATTERY REMAINING LONGEVITY: 9 mo
MDC IDC MSMT LEADCHNL LV IMPEDANCE VALUE: 430 Ohm
MDC IDC MSMT LEADCHNL RA IMPEDANCE VALUE: 360 Ohm
MDC IDC MSMT LEADCHNL RA PACING THRESHOLD AMPLITUDE: 0.75 V
MDC IDC MSMT LEADCHNL RV PACING THRESHOLD AMPLITUDE: 1.25 V
MDC IDC PG IMPLANT DT: 20150304
MDC IDC PG SERIAL: 7170478
MDC IDC SET LEADCHNL LV PACING PULSEWIDTH: 1 ms
MDC IDC SET LEADCHNL RV PACING PULSEWIDTH: 0.5 ms
MDC IDC STAT BRADY AP VS PERCENT: 1 %
MDC IDC STAT BRADY AS VS PERCENT: 1 %
MDC IDC STAT BRADY RA PERCENT PACED: 96 %

## 2018-04-22 ENCOUNTER — Encounter: Payer: Self-pay | Admitting: Podiatry

## 2018-04-22 ENCOUNTER — Ambulatory Visit (INDEPENDENT_AMBULATORY_CARE_PROVIDER_SITE_OTHER): Payer: Medicare Other | Admitting: Podiatry

## 2018-04-22 DIAGNOSIS — D689 Coagulation defect, unspecified: Secondary | ICD-10-CM

## 2018-04-22 DIAGNOSIS — M79676 Pain in unspecified toe(s): Secondary | ICD-10-CM | POA: Diagnosis not present

## 2018-04-22 DIAGNOSIS — Q828 Other specified congenital malformations of skin: Secondary | ICD-10-CM | POA: Diagnosis not present

## 2018-04-22 DIAGNOSIS — B351 Tinea unguium: Secondary | ICD-10-CM | POA: Diagnosis not present

## 2018-04-22 DIAGNOSIS — E1140 Type 2 diabetes mellitus with diabetic neuropathy, unspecified: Secondary | ICD-10-CM | POA: Diagnosis not present

## 2018-04-22 NOTE — Progress Notes (Signed)
Patient ID: CORDAE MCCAREY, male   DOB: Dec 10, 1928, 85 y.o.   MRN: [id number] Complaint:  Visit Type: Patient returns to my office for continued preventative foot care services. Complaint: Patient states" my nails have grown long and thick and become painful to walk and wear shoes" Patient has been diagnosed with DM with neuropathy.[name] He presents for preventative foot care services. No changes to ROS.  He also has painful callus under the outside ball of both feet. Patient is taking eliquiss.  Podiatric Exam: Vascular: dorsalis pedis and posterior tibial pulses are palpable bilateral. Capillary return is immediate. Temperature gradient is WNL. Skin turgor WNL  Sensorium: Diminished  Semmes Weinstein monofilament test. Normal tactile sensation bilaterally. Nail Exam: Pt has thick disfigured discolored nails with subungual debris noted bilateral entire nail hallux through fifth toenails Ulcer Exam: There is no evidence of ulcer or pre-ulcerative changes or infection. Orthopedic Exam: Muscle tone and strength are WNL. No limitations in general ROM. No crepitus or effusions noted. Foot type and digits show no abnormalities. Bony prominences are unremarkable. Asymptomatic HAV B/L. Skin:  Porokeratosis sub 5th metatarsal bilateral.. No infection or ulcers.   Diagnosis:  Tinea unguium, Pain in right toe, pain in left toes , Porokeratosis sub 5th  B/L  Treatment & Plan Procedures and Treatment: Consent by patient was obtained for treatment procedures. The patient understood the discussion of treatment and procedures well. All questions were answered thoroughly reviewed. Debridement of mycotic and hypertrophic toenails, 1 through 5 bilateral and clearing of subungual debris. No ulceration, no infection noted.  Return Visit-Office Procedure: Patient instructed to return to the office for a follow up visit 10 weeks  for continued evaluation and treatment.   Gardiner Barefoot DPM

## 2018-04-23 ENCOUNTER — Encounter (INDEPENDENT_AMBULATORY_CARE_PROVIDER_SITE_OTHER): Payer: Self-pay | Admitting: Orthopaedic Surgery

## 2018-04-23 ENCOUNTER — Ambulatory Visit (INDEPENDENT_AMBULATORY_CARE_PROVIDER_SITE_OTHER): Payer: Medicare Other | Admitting: Family Medicine

## 2018-04-23 DIAGNOSIS — G8929 Other chronic pain: Secondary | ICD-10-CM

## 2018-04-23 DIAGNOSIS — M25511 Pain in right shoulder: Secondary | ICD-10-CM | POA: Diagnosis not present

## 2018-04-23 MED ORDER — METHYLPREDNISOLONE ACETATE 40 MG/ML IJ SUSP
40.0000 mg | INTRAMUSCULAR | Status: AC | PRN
  Administered 2018-04-23: 40 mg via INTRA_ARTICULAR

## 2018-04-23 MED ORDER — LIDOCAINE HCL 1 % IJ SOLN
5.0000 mL | INTRAMUSCULAR | Status: AC | PRN
  Administered 2018-04-23: 5 mL
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
## 2018-04-23 NOTE — Patient Instructions (Addendum)
    Glucosamine Sulfate:  1,000 mg twice daily  Turmeric capsules:  500 mg twice daily  Vitamin D3:  2,000 IU daily  Avoid sugar intake

## 2018-05-01 ENCOUNTER — Ambulatory Visit (INDEPENDENT_AMBULATORY_CARE_PROVIDER_SITE_OTHER): Payer: Medicare Other

## 2018-05-01 DIAGNOSIS — I5022 Chronic systolic (congestive) heart failure: Secondary | ICD-10-CM

## 2018-05-01 DIAGNOSIS — I255 Ischemic cardiomyopathy: Secondary | ICD-10-CM

## 2018-05-02 LAB — CUP PACEART REMOTE DEVICE CHECK
Battery Voltage: 2.69 V
Brady Statistic AP VS Percent: 1 %
Brady Statistic AS VP Percent: 3.9 %
Brady Statistic AS VS Percent: 1 %
Date Time Interrogation Session: 20200212090016
HighPow Impedance: 44 Ohm
HighPow Impedance: 44 Ohm
Implantable Lead Implant Date: 20150304
Implantable Lead Implant Date: 20150304
Implantable Lead Location: 753859
Implantable Lead Location: 753860
Implantable Lead Model: 1581
Implantable Pulse Generator Implant Date: 20150304
Lead Channel Impedance Value: 360 Ohm
Lead Channel Impedance Value: 380 Ohm
Lead Channel Pacing Threshold Amplitude: 1 V
Lead Channel Pacing Threshold Pulse Width: 0.5 ms
Lead Channel Sensing Intrinsic Amplitude: 2.4 mV
Lead Channel Setting Pacing Amplitude: 2 V
Lead Channel Setting Pacing Amplitude: 2.5 V
Lead Channel Setting Pacing Amplitude: 2.75 V
Lead Channel Setting Pacing Pulse Width: 0.5 ms
Lead Channel Setting Pacing Pulse Width: 1 ms
MDC IDC LEAD IMPLANT DT: 20051221
MDC IDC LEAD LOCATION: 753858
MDC IDC MSMT BATTERY REMAINING LONGEVITY: 6 mo
MDC IDC MSMT BATTERY REMAINING PERCENTAGE: 11 %
MDC IDC MSMT LEADCHNL LV IMPEDANCE VALUE: 430 Ohm
MDC IDC MSMT LEADCHNL LV PACING THRESHOLD AMPLITUDE: 1.75 V
MDC IDC MSMT LEADCHNL LV PACING THRESHOLD PULSEWIDTH: 1 ms
MDC IDC MSMT LEADCHNL RV PACING THRESHOLD AMPLITUDE: 1 V
MDC IDC MSMT LEADCHNL RV PACING THRESHOLD PULSEWIDTH: 0.5 ms
MDC IDC MSMT LEADCHNL RV SENSING INTR AMPL: 12 mV
MDC IDC SET LEADCHNL RV SENSING SENSITIVITY: 0.5 mV
MDC IDC STAT BRADY AP VP PERCENT: 96 %
MDC IDC STAT BRADY RA PERCENT PACED: 95 %
Pulse Gen Serial Number: 7170478

## 2018-05-13 NOTE — Progress Notes (Signed)
Remote ICD transmission.   

## 2018-05-19 DIAGNOSIS — L570 Actinic keratosis: Secondary | ICD-10-CM | POA: Diagnosis not present

## 2018-05-19 DIAGNOSIS — L905 Scar conditions and fibrosis of skin: Secondary | ICD-10-CM | POA: Diagnosis not present

## 2018-05-19 DIAGNOSIS — Z85828 Personal history of other malignant neoplasm of skin: Secondary | ICD-10-CM | POA: Diagnosis not present

## 2018-06-06 ENCOUNTER — Encounter (HOSPITAL_COMMUNITY): Payer: Medicare Other | Admitting: Cardiology

## 2018-06-17 ENCOUNTER — Telehealth: Payer: Self-pay

## 2018-06-17 NOTE — Telephone Encounter (Signed)
Author phoned pt. to assess interest in scheduling virtual awv. No answer, author left detailed VM asking for return call if interested.

## 2018-07-08 ENCOUNTER — Other Ambulatory Visit: Payer: Self-pay

## 2018-07-08 ENCOUNTER — Telehealth (HOSPITAL_COMMUNITY): Payer: Self-pay

## 2018-07-08 ENCOUNTER — Ambulatory Visit (HOSPITAL_COMMUNITY)
Admission: RE | Admit: 2018-07-08 | Discharge: 2018-07-08 | Disposition: A | Discharge status: Home / Self Care | Payer: Medicare Other | Source: Ambulatory Visit | Attending: Cardiology | Admitting: Cardiology

## 2018-07-08 DIAGNOSIS — I480 Paroxysmal atrial fibrillation: Secondary | ICD-10-CM

## 2018-07-08 DIAGNOSIS — I5022 Chronic systolic (congestive) heart failure: Secondary | ICD-10-CM

## 2018-07-08 DIAGNOSIS — I5023 Acute on chronic systolic (congestive) heart failure: Secondary | ICD-10-CM

## 2018-07-08 NOTE — Progress Notes (Signed)
Heart Failure TeleHealth Note  Due to national recommendations of social distancing due to [place] 19, Audio/video telehealth visit is felt to be most appropriate for this patient at this time.  See MyChart message from today for patient consent regarding telehealth for [facility].  Date:  07/08/2018   ID:  [name], DOB 1928-10-08, MRN [id number]  Location: Home  Provider location: [facility] Type of Visit: Established patient   PCP:  Burnis Medin, MD  Cardiologist:  Dr. [name]  Chief Complaint: Shortness of breath.    History of Present Illness: [name] is a 85 y.o. male presents via audio/video conferencing for a telehealth visit today.   We attempted a video connection but were unable to pull up the image on his phone.  Therefore, we ended up with an audio-only visit.   he denies symptoms worrisome for COVID 19.   Patient has history of CAD s/p CABG, ischemic cardiomyopathy, and paroxysmal atrial fibrillation.  Patient was admitted in 2/16 with CHF and diuresed, he was sent home on Lasix 60 mg bid.  He missed 3 days of the pm Lasix dose and was re-admitted on 05/18/14 with acute on chronic systolic CHF with dyspnea and hypoxemia. He was diuresed with Lasix gtt and metolazone.  While in the hospital, he went into atrial fibrillation with RVR requiring cardioversion.  Creatinine was elevated and ramipril was stopped.  We had to cut back on Coreg due to hypotension and low output.  He was begun on low dose digoxin.    Main complaint continues to be right shoulder pain from osteoarthritis.  The pain is quite severe at this point.  He has not dyspnea walking on flat ground.  He walks for about 5 minutes at a time on his treadmill.  No chest pain.  No palpitations.  No lightheadedness/dizziness.  He can walk up a flight of stairs without problems. Weight has been stable.   St Jude device interrogation: No VT, no AF, 96% a-pacing, >99% BiV pacing,  stable thoracic impedance.   Labs (3/16): K 4.2, creatinine 2.26, digoxin < 0.2 Labs (05/31/2014): TSH 2.3 Dig level 0.6  Labs (06/14/2014): K 4.5 Creatinine 2.14, LDL 28, HDL 46 Labs (6/16): K 4.5, creatinine 2.08, BNP 712 Labs (7/16): K 4.1, creatinine 2.18, HCT 36.8, LFTs normal, digoxin 0.9 Labs (10/16): LDL 26, HDL 38, TSH normal, LFTs normal Labs (12/16): K 4.6, creatinine 2 Labs (2/17): LFTs normal, digoxin 1.1 Labs (4/17): K 4.7, creatinine 1.97, BUN 50, hgb 12.8, TSH normal, digoxin 0.8, LDL 29 Labs (7/17): digoxin 0.6 Labs (11/17): K 4.4, creatinine 2.13 Labs (12/17): hgb 11.4 Labs (5/18): LFTs normal, K 4.6, creatinne 2.21, hgb 10.9 Labs (7/19): Cr 1.97, K 4.4, Hgb 13.2, Hgb A1c 7.1. LFTs normal.  LDL 25 Labs (9/19): K 4.2, creatinine 1.96 Labs (12/19): TSH normal, digoxin 0.7, LFTs normal, LDL 24 Labs (1/20): hgb 13.4  PMH: 1. CAD: S/p CABG 2005 with LIMA-Diagonal, SVG-LAD, SVG-LCx, SVG-RCA.  Cardiolite in 2/15 with EF 19%, no ischemia.  2. Ischemic cardiomyopathy: Echo (2/16) with EF 20-25%, severe LV dilation with RWMAs, moderate AI, moderate MR, moderately decreased RV systolic function.  St Jude CRT-D device.  - Echo 10/2016 LVEF 20-25% Grade 1 DD, Mild/Mod MR, mild LAE, Mildly reduced RV function, Mild RAE, Peak PA pressure 32 mmHg 3. Atrial fibrillation: Paroxysmal.  DCCV 3/16.  4. CKD 5. Carotid stenosis: Carotid dopplers (1/16) with 60-79% RICA stenosis.  Carotids (1/17)  with bilateral 40-59% ICA stenosis.  - Carotids (1/18): 40-59% BICA stenosis.  6. HTN 7. Hyperlipidemia 8. Restless leg syndrome. 9. Type 2 diabetes. 10. OA 11. Depression 12. H/o CCY 9. H/o appy 14. OSA: Uses CPAP.  15. ABIs (7/17): Normal 16. Melanoma: s/p excision.   Current Outpatient Medications  Medication Sig Dispense Refill  . acetaminophen (TYLENOL) 500 MG tablet Take 500 mg by mouth daily as needed for mild pain.    [name] amiodarone (PACERONE) 200 MG tablet Take 0.5 tablets (100 mg  total) by mouth daily. 45 tablet 3  . apixaban (ELIQUIS) 2.5 MG TABS tablet Take 1 tablet (2.5 mg total) by mouth 2 (two) times daily. 270 tablet 3  . atorvastatin (LIPITOR) 40 MG tablet Take 1 tablet (40 mg total) by mouth daily. 90 tablet 3  . Blood Glucose Monitoring Suppl (ACCU-CHEK AVIVA PLUS) w/Device KIT Check blood sugars daily up to three times daily or as needed. 1 kit prn  . carvedilol (COREG) 12.5 MG tablet Take 1 tablet (12.5 mg total) by mouth 2 (two) times daily. 180 tablet 3  . digoxin (DIGOX) 0.125 MG tablet TAKE 1/2 TABLET BY MOUTH DAILY 45 tablet 3  . doxycycline (VIBRA-TABS) 100 MG tablet Take 1 tablet (100 mg total) by mouth 2 (two) times daily. 14 tablet 0  . escitalopram (LEXAPRO) 10 MG tablet Take 1 tablet (10 mg total) by mouth daily. 90 tablet 3  . gabapentin (NEURONTIN) 100 MG capsule Take 1 capsule (100 mg total) by mouth 3 (three) times daily. Can increase as directed 90 capsule 3  . glipiZIDE (GLUCOTROL) 10 MG tablet Take 1 tablet (10 mg total) by mouth daily before breakfast. 90 tablet 3  . glucose blood (ACCU-CHEK AVIVA) test strip Use as instructed 100 each 12  . hydrALAZINE (APRESOLINE) 25 MG tablet Take 1 tablet (25 mg total) by mouth every 8 (eight) hours. 270 tablet 3  . Insulin Detemir (LEVEMIR FLEXTOUCH) 100 UNIT/ML Pen Inject into the skin.    [name] insulin glargine (LANTUS) 100 unit/mL SOPN Inject 30 Units into the skin daily.    . Insulin Pen Needle (BD PEN NEEDLE NANO U/F) 32G X 4 MM MISC [place] TO TEST BLOOD SUGAR THREE TIMES DAILY 300 each 1  . isosorbide mononitrate (IMDUR) 30 MG 24 hr tablet Take 1 tablet (30 mg total) by mouth daily. 90 tablet 3  . Menthol, Topical Analgesic, (ICY HOT EX) Apply 1 application topically at bedtime as needed (leg pain).    . Multiple Vitamin (MULTIVITAMIN WITH MINERALS) TABS tablet Take 1 tablet by mouth daily.    Glory Rosebush DELICA LANCETS FINE MISC Use to test blood sugar 2-3 times daily 300 each 1  . polyethylene glycol  (MIRALAX) packet Take 17 g by mouth daily. 14 each 0  . potassium chloride SA (KLOR-CON M20) 20 MEQ tablet TAKE 2 TABLETS (40MEQ TOTAL) DAILY 180 tablet 3  . pramipexole (MIRAPEX) 0.5 MG tablet TAKE 0.36m 2-3 HOURS BEFORE SLEEPFOR RESTLESS LEG 90 tablet 3  . Semaglutide (OZEMPIC) 0.25 or 0.5 MG/DOSE SOPN Inject 0.5 mg into the skin once a week. 1.5 mL 3  . silodosin (RAPAFLO) 8 MG CAPS capsule TAKE 1 CAPSULE DAILY WITH  BREAKFAST 90 capsule 3  . spironolactone (ALDACTONE) 25 MG tablet Take 1 tablet (25 mg total) by mouth daily. 90 tablet 3  . torsemide (DEMADEX) 20 MG tablet Take 2 tablets (40 mg total) by mouth daily. 180 tablet 1   No current facility-administered medications for  this encounter.     Allergies:   Patient has no known allergies.   Social History:  The patient  reports that he quit smoking about 40 years ago. His smoking use included cigarettes. He has a 40.00 pack-year smoking history. He has quit using smokeless tobacco.  His smokeless tobacco use included chew. He reports that he does not drink alcohol or use drugs.   Family History:  The patient's family history includes COPD in his father.   ROS:  Please see the history of present illness.   All other systems are personally reviewed and negative.   Exam:  (Video/Tele Health Call; Exam is subjective and or/visual.) General:  Speaks in full sentences. No resp difficulty. Lungs: Normal respiratory effort with conversation.  Abdomen: Non-distended per patient report Extremities: Pt denies edema. Neuro: Alert & oriented x 3.   Recent Labs: 02/28/2018: ALT 13; TSH 1.938 03/26/2018: BUN 32; Creatinine, Ser 1.82; Hemoglobin 13.4; Platelets 185.0; Potassium 4.6; Sodium 141  Personally reviewed   Wt Readings from Last 3 Encounters:  03/26/18 87 kg (191 lb 12.8 oz)  02/28/18 86.7 kg (191 lb 3.2 oz)  02/10/18 85.9 kg (189 lb 6.4 oz)      ASSESSMENT AND PLAN:  1. Chronic systolic CHF: Ischemic cardiomyopathy.  St Jude  CRT-D.  Echo 10/2016 LVEF 20-25%. NYHA class II symptoms.  He is >99% BiV paced on device interrogation today.  Weight is stable and stable thoracic impedance on Corevue.  - Continue torsemide 40 mg daily.  Check BMET.     - Continue current hydralazine/Imdur  - Will leave off ACEI/ARB/ARNI for now with elevated creatinine.  - Continue spironolactone 25 mg daily.   - Continue current digoxin, check level.   - Continue Coreg 12.5 mg bid.  - I will obtain repeat echocardiogram.  2. Atrial fibrillation: Paroxysmal.  ICD interrogation today showed no atrial fibrillation.   - Continue amiodarone 100 mg daily.  Check TSH and LFTs today.  He will need a regular eye exam.   - Continue Eliquis (dosed at 2.5 mg bid with age and renal dysfunction).   3. CKD: Stage 3.  - BMET will be arranged.   4. CAD: No chest pain.  - Continue on statin, good lipids in 12/19.   - He is not on ASA 81 given stable CAD and Eliquis use.   5. Carotid stenosis: [place] 04/2017 with 40-59% RICA.  - Need to arrange repeat carotid dopplers at his next appt.  6. OSA: Continue nightly CPAP.  7. Shoulder osteoarthritis: Pain is severe.  He is interested in a shoulder replacement.  He is stable from a CHF standpoint, has had no chest pain, and is able to climb a flight of stairs without problems.  I think he could have an operation if needed though he would be of moderate risk and would suggest doing any procedure at [facility].  He would need to hold Eliquis 2 days prior to surgery.   COVID screen The patient does not have any symptoms that suggest any further testing/ screening at this time.  Social distancing reinforced today.  Patient Risk: After full review of this patients clinical status, I feel that they are at moderate risk for cardiac decompensation at this time.  Relevant cardiac medications were reviewed at length with the patient today. The patient does not have concerns regarding their medications at this time.    Recommended follow-up:  2 months with echo  Today, I have spent 21 minutes  with the patient with telehealth technology discussing the above issues .    Signed, [name], MD  07/08/2018 9:42 PM  [facility] [address] [facility] [place] [address] [phone] (office) [phone] (fax)

## 2018-07-08 NOTE — Telephone Encounter (Signed)
Reviewed AVS with pt:  1. Needs CMET, TSH, digoxin level/22 April @1045  2. Please get remote reading from [place] device and forward to me./made aware to send transmission  3. Followup 2 months with echocardiogram. /forwarded to dawne to schedule

## 2018-07-08 NOTE — Patient Instructions (Signed)
1. Needs CMET, TSH, digoxin level/22 April @1045  2. Please get remote reading from [place] device and forward to me./made aware to send transmission  3. Followup 2 months with echocardiogram. /forwarded to dawne to schedule

## 2018-07-09 ENCOUNTER — Ambulatory Visit (HOSPITAL_COMMUNITY)
Admission: RE | Admit: 2018-07-09 | Discharge: 2018-07-09 | Disposition: A | Discharge status: Home / Self Care | Payer: Medicare Other | Source: Ambulatory Visit | Attending: Cardiology | Admitting: Cardiology

## 2018-07-09 ENCOUNTER — Encounter (HOSPITAL_COMMUNITY): Payer: Medicare Other

## 2018-07-09 ENCOUNTER — Telehealth (HOSPITAL_COMMUNITY): Payer: Self-pay

## 2018-07-09 ENCOUNTER — Other Ambulatory Visit: Payer: Self-pay

## 2018-07-09 DIAGNOSIS — I5022 Chronic systolic (congestive) heart failure: Secondary | ICD-10-CM

## 2018-07-09 DIAGNOSIS — I5023 Acute on chronic systolic (congestive) heart failure: Secondary | ICD-10-CM | POA: Diagnosis not present

## 2018-07-09 LAB — COMPREHENSIVE METABOLIC PANEL
ALT: 7 U/L (ref 0–44)
AST: 24 U/L (ref 15–41)
Albumin: 3.4 g/dL — ABNORMAL LOW (ref 3.5–5.0)
Alkaline Phosphatase: 61 U/L (ref 38–126)
Anion gap: 10 (ref 5–15)
BUN: 32 mg/dL — ABNORMAL HIGH (ref 8–23)
CO2: 27 mmol/L (ref 22–32)
Calcium: 9.4 mg/dL (ref 8.9–10.3)
Chloride: 103 mmol/L (ref 98–111)
Creatinine, Ser: 1.96 mg/dL — ABNORMAL HIGH (ref 0.61–1.24)
GFR calc Af Amer: 34 mL/min — ABNORMAL LOW (ref >60)
GFR calc non Af Amer: 30 mL/min — ABNORMAL LOW (ref >60)
Glucose, Bld: 185 mg/dL — ABNORMAL HIGH (ref 70–99)
Potassium: 5.3 mmol/L — ABNORMAL HIGH (ref 3.5–5.1)
Sodium: 140 mmol/L (ref 135–145)
Total Bilirubin: 1 mg/dL (ref 0.3–1.2)
Total Protein: 5.7 g/dL — ABNORMAL LOW (ref 6.5–8.1)

## 2018-07-09 LAB — TSH: TSH: 2.203 u[IU]/mL (ref 0.350–4.500)

## 2018-07-09 LAB — DIGOXIN LEVEL: Digoxin Level: 0.7 ng/mL — ABNORMAL LOW (ref 0.8–2.0)

## 2018-07-09 NOTE — Addendum Note (Signed)
Encounter addended by: Marlise Eves, RN on: 07/09/2018 10:52 AM  Actions taken: Visit diagnoses modified, Order list changed, Diagnosis association updated

## 2018-07-09 NOTE — Telephone Encounter (Signed)
Relayed results to pt and wife, will stop K+ and have Debbie schedule repeat lab work in 10 days

## 2018-07-10 ENCOUNTER — Telehealth (INDEPENDENT_AMBULATORY_CARE_PROVIDER_SITE_OTHER): Payer: Self-pay

## 2018-07-10 ENCOUNTER — Ambulatory Visit (INDEPENDENT_AMBULATORY_CARE_PROVIDER_SITE_OTHER): Payer: Medicare Other | Admitting: Orthopedic Surgery

## 2018-07-10 ENCOUNTER — Encounter (INDEPENDENT_AMBULATORY_CARE_PROVIDER_SITE_OTHER): Payer: Self-pay | Admitting: Orthopedic Surgery

## 2018-07-10 ENCOUNTER — Other Ambulatory Visit: Payer: Self-pay

## 2018-07-10 DIAGNOSIS — M19019 Primary osteoarthritis, unspecified shoulder: Secondary | ICD-10-CM

## 2018-07-10 DIAGNOSIS — I255 Ischemic cardiomyopathy: Secondary | ICD-10-CM

## 2018-07-10 NOTE — Telephone Encounter (Signed)
Patient coming in on Monday to see Hil[facility] for shoulder gel injection with ultrasound. Patient provided injection.
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
## 2018-07-14 ENCOUNTER — Other Ambulatory Visit: Payer: Self-pay

## 2018-07-14 ENCOUNTER — Ambulatory Visit (INDEPENDENT_AMBULATORY_CARE_PROVIDER_SITE_OTHER): Payer: Medicare Other | Admitting: Family Medicine

## 2018-07-14 ENCOUNTER — Telehealth: Payer: Self-pay

## 2018-07-14 ENCOUNTER — Encounter (INDEPENDENT_AMBULATORY_CARE_PROVIDER_SITE_OTHER): Payer: Self-pay | Admitting: Family Medicine

## 2018-07-14 DIAGNOSIS — M19019 Primary osteoarthritis, unspecified shoulder: Secondary | ICD-10-CM

## 2018-07-14 NOTE — Telephone Encounter (Signed)
Spoke to Mrs. Afzal about a home visit to draw labs as requested by the HF clinic.  We agreed on Friday May 1st around 1pm.

## 2018-07-14 NOTE — Progress Notes (Signed)
Subjective: Patient is here for ultrasound-guided intra-articular right glenohumeral injection.  Objective:  Very limited ROM with crepitus.  Procedure: Ultrasound-guided right glenohumeral injection: After sterile prep with Betadine, injected 8 cc 1% lidocaine without epinephrine and 40 mg methylprednisolone using a 22-gauge spinal needle, passing the needle through approach into the glenohumeral joint.  Monovisc injected (patient supplied).  Pain and crepitus improved post-procedure.

## 2018-07-18 ENCOUNTER — Telehealth: Payer: Self-pay | Admitting: Family Medicine

## 2018-07-18 NOTE — Telephone Encounter (Signed)
Pt called to thank Dr Junius Roads for the injection.  He said he is feeling great and just wanted to show you some appreciation!

## 2018-07-21 ENCOUNTER — Other Ambulatory Visit (HOSPITAL_COMMUNITY): Payer: Self-pay | Admitting: Cardiology

## 2018-07-21 DIAGNOSIS — I6523 Occlusion and stenosis of bilateral carotid arteries: Secondary | ICD-10-CM

## 2018-07-21 NOTE — Progress Notes (Signed)
Home visit to draw BMET from [name].  He express no concerns and stated he felt fine.  New, handmade and packaged cloth face masks given to [name] for them to have. Lab tube labeled appropriately and brought back to the HF clinic where it was tubed to the lab.

## 2018-07-22 ENCOUNTER — Other Ambulatory Visit: Payer: Self-pay

## 2018-07-22 ENCOUNTER — Ambulatory Visit (HOSPITAL_COMMUNITY)
Admission: RE | Admit: 2018-07-22 | Discharge: 2018-07-22 | Disposition: A | Discharge status: Home / Self Care | Payer: Medicare Other | Source: Ambulatory Visit | Attending: Internal Medicine | Admitting: Internal Medicine

## 2018-07-22 ENCOUNTER — Telehealth (HOSPITAL_COMMUNITY): Payer: Self-pay

## 2018-07-22 DIAGNOSIS — I5032 Chronic diastolic (congestive) heart failure: Secondary | ICD-10-CM | POA: Insufficient documentation

## 2018-07-22 NOTE — Telephone Encounter (Signed)
Received call from [place], labs not resulted from last week.  Per lab tech,  unable to process labs from last week because they did not see order. Pt requires repeat of labs.  Patient wife made aware and amenable to repeating blood work.  Vanita Ingles will go to patients home today to draw labs

## 2018-07-23 ENCOUNTER — Encounter: Payer: Self-pay | Admitting: Podiatry

## 2018-07-23 ENCOUNTER — Ambulatory Visit (INDEPENDENT_AMBULATORY_CARE_PROVIDER_SITE_OTHER): Payer: Medicare Other | Admitting: Podiatry

## 2018-07-23 ENCOUNTER — Other Ambulatory Visit: Payer: Self-pay

## 2018-07-23 VITALS — Temp 97.5°F

## 2018-07-23 DIAGNOSIS — E1140 Type 2 diabetes mellitus with diabetic neuropathy, unspecified: Secondary | ICD-10-CM | POA: Diagnosis not present

## 2018-07-23 DIAGNOSIS — M216X9 Other acquired deformities of unspecified foot: Secondary | ICD-10-CM

## 2018-07-23 DIAGNOSIS — D689 Coagulation defect, unspecified: Secondary | ICD-10-CM

## 2018-07-23 DIAGNOSIS — B351 Tinea unguium: Secondary | ICD-10-CM | POA: Diagnosis not present

## 2018-07-23 DIAGNOSIS — M79676 Pain in unspecified toe(s): Secondary | ICD-10-CM | POA: Diagnosis not present

## 2018-07-23 DIAGNOSIS — Q828 Other specified congenital malformations of skin: Secondary | ICD-10-CM | POA: Diagnosis not present

## 2018-07-23 NOTE — Progress Notes (Signed)
Patient ID: [name], male   DOB: Jul 07, 1928, 85 y.o.   MRN: [id number] Complaint:  Visit Type: Patient returns to my office for continued preventative foot care services. Complaint: Patient states" my nails have grown long and thick and become painful to walk and wear shoes" Patient has been diagnosed with DM with neuropathy.[name] He presents for preventative foot care services. No changes to ROS.  He also has painful callus under the outside ball of both feet. Patient is taking eliquiss.  Podiatric Exam: Vascular: dorsalis pedis and posterior tibial pulses are palpable bilateral. Capillary return is immediate. Temperature gradient is WNL. Skin turgor WNL  Sensorium: Diminished  Semmes Weinstein monofilament test. Normal tactile sensation bilaterally. Nail Exam: Pt has thick disfigured discolored nails with subungual debris noted bilateral entire nail hallux through fifth toenails Ulcer Exam: There is no evidence of ulcer or pre-ulcerative changes or infection. Orthopedic Exam: Muscle tone and strength are WNL. No limitations in general ROM. No crepitus or effusions noted. Foot type and digits show no abnormalities. Bony prominences are unremarkable. Asymptomatic HAV B/L. Skin:  Porokeratosis sub 5th metatarsal bilateral.. No infection or ulcers.   Diagnosis:  Tinea unguium, Pain in right toe, pain in left toes , Porokeratosis sub 5th  B/L  Treatment & Plan Procedures and Treatment: Consent by patient was obtained for treatment procedures. The patient understood the discussion of treatment and procedures well. All questions were answered thoroughly reviewed. Debridement of mycotic and hypertrophic toenails, 1 through 5 bilateral and clearing of subungual debris. No ulceration, no infection noted.  Return Visit-Office Procedure: Patient instructed to return to the office for a follow up visit 10 weeks  for continued evaluation and treatment.   Gardiner Barefoot DPM

## 2018-07-24 NOTE — Progress Notes (Signed)
Redraw of BMET at [name] home per protocol.  Lab tube, appropriately labeled and brought to the HF front desk RN who forwarded the blood to the lab.

## 2018-07-25 LAB — BASIC METABOLIC PANEL WITH GFR
Anion gap: 14 (ref 5–15)
BUN: 30 mg/dL — ABNORMAL HIGH (ref 8–23)
CO2: 26 mmol/L (ref 22–32)
Calcium: 9.1 mg/dL (ref 8.9–10.3)
Chloride: 103 mmol/L (ref 98–111)
Creatinine, Ser: 1.91 mg/dL — ABNORMAL HIGH (ref 0.61–1.24)
GFR calc Af Amer: 35 mL/min — ABNORMAL LOW
GFR calc non Af Amer: 31 mL/min — ABNORMAL LOW
Glucose, Bld: 170 mg/dL — ABNORMAL HIGH (ref 70–99)
Potassium: 4.1 mmol/L (ref 3.5–5.1)
Sodium: 143 mmol/L (ref 135–145)

## 2018-07-30 LAB — CUP PACEART REMOTE DEVICE CHECK
Date Time Interrogation Session: 20200513185222
Implantable Lead Implant Date: 20051221
Implantable Lead Implant Date: 20150304
Implantable Lead Implant Date: 20150304
Implantable Lead Location: 753858
Implantable Lead Location: 753859
Implantable Lead Location: 753860
Implantable Lead Model: 1581
Implantable Pulse Generator Implant Date: 20150304
Pulse Gen Serial Number: 7170478

## 2018-07-31 ENCOUNTER — Other Ambulatory Visit: Payer: Self-pay

## 2018-07-31 ENCOUNTER — Ambulatory Visit (INDEPENDENT_AMBULATORY_CARE_PROVIDER_SITE_OTHER): Payer: Medicare Other | Admitting: *Deleted

## 2018-07-31 DIAGNOSIS — I255 Ischemic cardiomyopathy: Secondary | ICD-10-CM | POA: Diagnosis not present

## 2018-08-04 ENCOUNTER — Ambulatory Visit (HOSPITAL_COMMUNITY)
Admission: RE | Admit: 2018-08-04 | Discharge: 2018-08-04 | Disposition: A | Discharge status: Home / Self Care | Payer: Medicare Other | Source: Ambulatory Visit | Attending: Cardiology | Admitting: Cardiology

## 2018-08-04 ENCOUNTER — Other Ambulatory Visit: Payer: Self-pay

## 2018-08-04 DIAGNOSIS — I6523 Occlusion and stenosis of bilateral carotid arteries: Secondary | ICD-10-CM | POA: Diagnosis not present

## 2018-08-05 ENCOUNTER — Encounter: Payer: Self-pay | Admitting: Cardiology

## 2018-08-05 ENCOUNTER — Telehealth (HOSPITAL_COMMUNITY): Payer: Self-pay

## 2018-08-05 NOTE — Progress Notes (Signed)
Remote ICD transmission.   

## 2018-08-05 NOTE — Telephone Encounter (Signed)
Wife made aware of results, confirmed f/u appt in July.

## 2018-08-07 ENCOUNTER — Other Ambulatory Visit: Payer: Self-pay | Admitting: Internal Medicine

## 2018-08-19 DIAGNOSIS — L570 Actinic keratosis: Secondary | ICD-10-CM | POA: Diagnosis not present

## 2018-08-19 DIAGNOSIS — L814 Other melanin hyperpigmentation: Secondary | ICD-10-CM | POA: Diagnosis not present

## 2018-08-19 DIAGNOSIS — D0461 Carcinoma in situ of skin of right upper limb, including shoulder: Secondary | ICD-10-CM | POA: Diagnosis not present

## 2018-08-19 DIAGNOSIS — L819 Disorder of pigmentation, unspecified: Secondary | ICD-10-CM | POA: Diagnosis not present

## 2018-08-19 DIAGNOSIS — D485 Neoplasm of uncertain behavior of skin: Secondary | ICD-10-CM | POA: Diagnosis not present

## 2018-08-19 DIAGNOSIS — D045 Carcinoma in situ of skin of trunk: Secondary | ICD-10-CM | POA: Diagnosis not present

## 2018-08-19 DIAGNOSIS — C44529 Squamous cell carcinoma of skin of other part of trunk: Secondary | ICD-10-CM | POA: Diagnosis not present

## 2018-08-19 DIAGNOSIS — D1801 Hemangioma of skin and subcutaneous tissue: Secondary | ICD-10-CM | POA: Diagnosis not present

## 2018-08-19 DIAGNOSIS — L821 Other seborrheic keratosis: Secondary | ICD-10-CM | POA: Diagnosis not present

## 2018-08-19 DIAGNOSIS — Z85828 Personal history of other malignant neoplasm of skin: Secondary | ICD-10-CM | POA: Diagnosis not present

## 2018-09-05 ENCOUNTER — Other Ambulatory Visit (HOSPITAL_COMMUNITY): Payer: Self-pay | Admitting: Cardiology

## 2018-09-05 DIAGNOSIS — I6523 Occlusion and stenosis of bilateral carotid arteries: Secondary | ICD-10-CM

## 2018-09-15 ENCOUNTER — Telehealth: Payer: Self-pay | Admitting: *Deleted

## 2018-09-15 NOTE — Telephone Encounter (Signed)
LMOM requesting call back to DC. Gave direct number for return call.  [facility]lin alert received for ERI as of 09/14/18.

## 2018-09-15 NOTE — Telephone Encounter (Signed)
Patient returned call. Advised of ICD at [facility] as of 09/14/18. Pt reports he is not aware of vibratory alert. Explained if he notices it now, no need to do anything as we are aware of ERI. Pt aware he will need an appointment to discuss gen change with EP APP or Dr. Caryl Comes. Advised a scheduler will contact him to setup this appointment. Pt verbalizes understanding and denies additional questions at this time.

## 2018-09-17 NOTE — Telephone Encounter (Signed)
Pts wife agreed to July 13th appt with Dr. Caryl Comes. She understands pt will need to come by himself and wear a mask.

## 2018-09-18 ENCOUNTER — Telehealth (HOSPITAL_COMMUNITY): Payer: Self-pay | Admitting: Cardiology

## 2018-09-18 NOTE — Telephone Encounter (Signed)
COVID screening completed. ° °-[facility] °

## 2018-09-22 ENCOUNTER — Encounter (HOSPITAL_COMMUNITY): Payer: Self-pay | Admitting: Cardiology

## 2018-09-22 ENCOUNTER — Ambulatory Visit (HOSPITAL_COMMUNITY)
Admission: RE | Admit: 2018-09-22 | Discharge: 2018-09-22 | Disposition: A | Discharge status: Home / Self Care | Payer: Medicare Other | Source: Ambulatory Visit | Attending: Cardiology | Admitting: Cardiology

## 2018-09-22 ENCOUNTER — Ambulatory Visit (HOSPITAL_BASED_OUTPATIENT_CLINIC_OR_DEPARTMENT_OTHER)
Admission: RE | Admit: 2018-09-22 | Discharge: 2018-09-22 | Disposition: A | Discharge status: Home / Self Care | Payer: Medicare Other | Source: Ambulatory Visit | Attending: Cardiology | Admitting: Cardiology

## 2018-09-22 ENCOUNTER — Other Ambulatory Visit: Payer: Self-pay

## 2018-09-22 VITALS — BP 122/68 | HR 68 | Wt 185.8 lb

## 2018-09-22 DIAGNOSIS — M19019 Primary osteoarthritis, unspecified shoulder: Secondary | ICD-10-CM | POA: Insufficient documentation

## 2018-09-22 DIAGNOSIS — R9431 Abnormal electrocardiogram [ECG] [EKG]: Secondary | ICD-10-CM | POA: Diagnosis not present

## 2018-09-22 DIAGNOSIS — G4733 Obstructive sleep apnea (adult) (pediatric): Secondary | ICD-10-CM | POA: Insufficient documentation

## 2018-09-22 DIAGNOSIS — I255 Ischemic cardiomyopathy: Secondary | ICD-10-CM | POA: Diagnosis not present

## 2018-09-22 DIAGNOSIS — Z951 Presence of aortocoronary bypass graft: Secondary | ICD-10-CM | POA: Diagnosis not present

## 2018-09-22 DIAGNOSIS — N183 Chronic kidney disease, stage 3 (moderate): Secondary | ICD-10-CM | POA: Diagnosis not present

## 2018-09-22 DIAGNOSIS — I5022 Chronic systolic (congestive) heart failure: Secondary | ICD-10-CM | POA: Insufficient documentation

## 2018-09-22 DIAGNOSIS — Z87891 Personal history of nicotine dependence: Secondary | ICD-10-CM | POA: Insufficient documentation

## 2018-09-22 DIAGNOSIS — I352 Nonrheumatic aortic (valve) stenosis with insufficiency: Secondary | ICD-10-CM | POA: Insufficient documentation

## 2018-09-22 DIAGNOSIS — I480 Paroxysmal atrial fibrillation: Secondary | ICD-10-CM | POA: Diagnosis not present

## 2018-09-22 DIAGNOSIS — E1122 Type 2 diabetes mellitus with diabetic chronic kidney disease: Secondary | ICD-10-CM | POA: Diagnosis not present

## 2018-09-22 DIAGNOSIS — Z79899 Other long term (current) drug therapy: Secondary | ICD-10-CM | POA: Insufficient documentation

## 2018-09-22 DIAGNOSIS — Z7901 Long term (current) use of anticoagulants: Secondary | ICD-10-CM | POA: Insufficient documentation

## 2018-09-22 DIAGNOSIS — E785 Hyperlipidemia, unspecified: Secondary | ICD-10-CM | POA: Insufficient documentation

## 2018-09-22 DIAGNOSIS — Z794 Long term (current) use of insulin: Secondary | ICD-10-CM | POA: Insufficient documentation

## 2018-09-22 DIAGNOSIS — F329 Major depressive disorder, single episode, unspecified: Secondary | ICD-10-CM | POA: Insufficient documentation

## 2018-09-22 DIAGNOSIS — G2581 Restless legs syndrome: Secondary | ICD-10-CM | POA: Diagnosis not present

## 2018-09-22 DIAGNOSIS — I6521 Occlusion and stenosis of right carotid artery: Secondary | ICD-10-CM | POA: Diagnosis not present

## 2018-09-22 DIAGNOSIS — I130 Hypertensive heart and chronic kidney disease with heart failure and stage 1 through stage 4 chronic kidney disease, or unspecified chronic kidney disease: Secondary | ICD-10-CM | POA: Insufficient documentation

## 2018-09-22 DIAGNOSIS — I25810 Atherosclerosis of coronary artery bypass graft(s) without angina pectoris: Secondary | ICD-10-CM | POA: Diagnosis not present

## 2018-09-22 DIAGNOSIS — E038 Other specified hypothyroidism: Secondary | ICD-10-CM

## 2018-09-22 DIAGNOSIS — Z85820 Personal history of malignant melanoma of skin: Secondary | ICD-10-CM | POA: Insufficient documentation

## 2018-09-22 LAB — COMPREHENSIVE METABOLIC PANEL
ALT: <5 U/L (ref 0–44)
AST: 25 U/L (ref 15–41)
Albumin: 3.6 g/dL (ref 3.5–5.0)
Alkaline Phosphatase: 63 U/L (ref 38–126)
Anion gap: 12 (ref 5–15)
BUN: 42 mg/dL — ABNORMAL HIGH (ref 8–23)
CO2: 24 mmol/L (ref 22–32)
Calcium: 9.4 mg/dL (ref 8.9–10.3)
Chloride: 102 mmol/L (ref 98–111)
Creatinine, Ser: 2.27 mg/dL — ABNORMAL HIGH (ref 0.61–1.24)
GFR calc Af Amer: 29 mL/min — ABNORMAL LOW (ref >60)
GFR calc non Af Amer: 25 mL/min — ABNORMAL LOW (ref >60)
Glucose, Bld: 183 mg/dL — ABNORMAL HIGH (ref 70–99)
Potassium: 5.2 mmol/L — ABNORMAL HIGH (ref 3.5–5.1)
Sodium: 138 mmol/L (ref 135–145)
Total Bilirubin: 1.7 mg/dL — ABNORMAL HIGH (ref 0.3–1.2)
Total Protein: 5.5 g/dL — ABNORMAL LOW (ref 6.5–8.1)

## 2018-09-22 LAB — LIPID PANEL
Cholesterol: 81 mg/dL (ref 0–200)
HDL: 34 mg/dL — ABNORMAL LOW (ref >40)
LDL Cholesterol: 9 mg/dL (ref 0–99)
Total CHOL/HDL Ratio: 2.4 RATIO
Triglycerides: 190 mg/dL — ABNORMAL HIGH (ref <150)
VLDL: 38 mg/dL (ref 0–40)

## 2018-09-22 LAB — TSH: TSH: 1.905 u[IU]/mL (ref 0.350–4.500)

## 2018-09-22 LAB — DIGOXIN LEVEL: Digoxin Level: 0.5 ng/mL — ABNORMAL LOW (ref 0.8–2.0)

## 2018-09-22 MED ORDER — PERFLUTREN LIPID MICROSPHERE
1.0000 mL | INTRAVENOUS | Status: DC | PRN
  Administered 2018-09-22: 2 mL via INTRAVENOUS
  Filled 2018-09-22: qty 10

## 2018-09-22 MED ORDER — ISOSORBIDE MONONITRATE ER 60 MG PO TB24: 60.0000 mg | ORAL_TABLET | Freq: Every day | ORAL | 3 refills | Status: DC

## 2018-09-22 MED ORDER — HYDRALAZINE HCL 25 MG PO TABS: 37.5000 mg | ORAL_TABLET | Freq: Three times a day (TID) | ORAL | 3 refills | Status: DC

## 2018-09-22 NOTE — Progress Notes (Signed)
  Echocardiogram 2D Echocardiogram has been performed.  [name] 09/22/2018, 2:02 PM

## 2018-09-22 NOTE — Patient Instructions (Signed)
Increase Hydralazine to 37.5 mg (1 & 1/2 tabs) Three times a day   Increase Imdur to 60 mg daily  Labs done today  We will contact you in 3 months to schedule your next appointment.

## 2018-09-23 ENCOUNTER — Telehealth (HOSPITAL_COMMUNITY): Payer: Self-pay

## 2018-09-23 ENCOUNTER — Other Ambulatory Visit (HOSPITAL_COMMUNITY): Payer: Self-pay | Admitting: *Deleted

## 2018-09-23 DIAGNOSIS — I5022 Chronic systolic (congestive) heart failure: Secondary | ICD-10-CM

## 2018-09-23 MED ORDER — ISOSORBIDE MONONITRATE ER 60 MG PO TB24: 60.0000 mg | ORAL_TABLET | Freq: Every day | ORAL | 3 refills | Status: DC

## 2018-09-23 MED ORDER — HYDRALAZINE HCL 25 MG PO TABS: 37.5000 mg | ORAL_TABLET | Freq: Three times a day (TID) | ORAL | 3 refills | Status: DC

## 2018-09-23 NOTE — Progress Notes (Signed)
Date:  09/23/2018   ID:  [name], DOB January 20, 1929, MRN [id number]  Provider location: [place] [facility] Type of Visit: Established patient   PCP:  Burnis Medin, MD  Cardiologist:  Dr. [name]   History of Present Illness: [name] is a 85 y.o. male who has history of CAD s/p CABG, ischemic cardiomyopathy, and paroxysmal atrial fibrillation.  Patient was admitted in 2/16 with CHF and diuresed, he was sent home on Lasix 60 mg bid.  He missed 3 days of the pm Lasix dose and was re-admitted on 05/18/14 with acute on chronic systolic CHF with dyspnea and hypoxemia. He was diuresed with Lasix gtt and metolazone.  While in the hospital, he went into atrial fibrillation with RVR requiring cardioversion.  Creatinine was elevated and ramipril was stopped.  We had to cut back on Coreg due to hypotension and low output.  He was begun on low dose digoxin.    Echo was done today and reviewed, EF 20-25% with LAD territory akinesis, mildly decreased RV systolic function, moderate AS with mild AI.   Weight is down 6 lbs.  Patient reports stable dyspnea after walking about 100 yards.  He is short of breath with heavier exertion.  No chest pain.  No orthopnea/PND.  He still has left shoulder pain from osteoarthritis.  Had injection in shoulder in 4/20 that helped. No bendopnea.  No lightheadedness.  Uses cane for balance.   St Jude device interrogation: No AF, no VT, > 99% BiV paced, stable thoracic impedance.    Labs (3/16): K 4.2, creatinine 2.26, digoxin < 0.2 Labs (05/31/2014): TSH 2.3 Dig level 0.6  Labs (06/14/2014): K 4.5 Creatinine 2.14, LDL 28, HDL 46 Labs (6/16): K 4.5, creatinine 2.08, BNP 712 Labs (7/16): K 4.1, creatinine 2.18, HCT 36.8, LFTs normal, digoxin 0.9 Labs (10/16): LDL 26, HDL 38, TSH normal, LFTs normal Labs (12/16): K 4.6, creatinine 2 Labs (2/17): LFTs normal, digoxin 1.1 Labs (4/17): K 4.7, creatinine 1.97, BUN 50, hgb 12.8, TSH normal, digoxin  0.8, LDL 29 Labs (7/17): digoxin 0.6 Labs (11/17): K 4.4, creatinine 2.13 Labs (12/17): hgb 11.4 Labs (5/18): LFTs normal, K 4.6, creatinne 2.21, hgb 10.9 Labs (7/19): Cr 1.97, K 4.4, Hgb 13.2, Hgb A1c 7.1. LFTs normal.  LDL 25 Labs (9/19): K 4.2, creatinine 1.96 Labs (12/19): TSH normal, digoxin 0.7, LFTs normal, LDL 24 Labs (1/20): hgb 13.4 Labs (4/20): LFTs normal, digoxin 0.7, TSH normal Labs (5/20): K 4.1, creatinine 1.91  PMH: 1. CAD: S/p CABG 2005 with LIMA-Diagonal, SVG-LAD, SVG-LCx, SVG-RCA.  Cardiolite in 2/15 with EF 19%, no ischemia.  2. Ischemic cardiomyopathy: Echo (2/16) with EF 20-25%, severe LV dilation with RWMAs, moderate AI, moderate MR, moderately decreased RV systolic function.  St Jude CRT-D device.  - Echo 10/2016 LVEF 20-25% Grade 1 DD, Mild/Mod MR, mild LAE, Mildly reduced RV function, Mild RAE, Peak PA pressure 32 mmHg - Echo (7/20): EF 20-25%, LAD territory AK, normal RV size with mildly decreased systolic function, mild AI with moderate AS (mean gradient 24 mmHg).  3. Atrial fibrillation: Paroxysmal.  DCCV 3/16.  4. CKD 5. Carotid stenosis: Carotid dopplers (1/16) with 60-79% RICA stenosis.  Carotids (1/17) with bilateral 40-59% ICA stenosis.  - Carotids (1/18): 40-59% BICA stenosis.  - Carotids (5/20): 40-59% RICA stenosis.  6. HTN 7. Hyperlipidemia 8. Restless leg syndrome. 9. Type 2 diabetes. 10. OA 11. Depression 12. H/o CCY 59. H/o appy 14. OSA: Uses  CPAP.  15. ABIs (7/17): Normal 16. Melanoma: s/p excision. 17. Aortic stenosis: Moderate on 7/20 echo.    Current Outpatient Medications  Medication Sig Dispense Refill  . acetaminophen (TYLENOL) 500 MG tablet Take 500 mg by mouth daily as needed for mild pain.    [name] amiodarone (PACERONE) 200 MG tablet Take 0.5 tablets (100 mg total) by mouth daily. 45 tablet 3  . apixaban (ELIQUIS) 2.5 MG TABS tablet Take 1 tablet (2.5 mg total) by mouth 2 (two) times daily. 270 tablet 3  . atorvastatin (LIPITOR)  40 MG tablet Take 1 tablet (40 mg total) by mouth daily. 90 tablet 3  . Blood Glucose Monitoring Suppl (ACCU-CHEK AVIVA PLUS) w/Device KIT Check blood sugars daily up to three times daily or as needed. 1 kit prn  . carvedilol (COREG) 12.5 MG tablet Take 1 tablet (12.5 mg total) by mouth 2 (two) times daily. 180 tablet 3  . digoxin (DIGOX) 0.125 MG tablet TAKE 1/2 TABLET BY MOUTH DAILY 45 tablet 3  . escitalopram (LEXAPRO) 10 MG tablet Take 1 tablet (10 mg total) by mouth daily. 90 tablet 3  . gabapentin (NEURONTIN) 100 MG capsule Take 1 capsule (100 mg total) by mouth 3 (three) times daily. Can increase as directed 90 capsule 3  . glipiZIDE (GLUCOTROL) 10 MG tablet Take 1 tablet (10 mg total) by mouth daily before breakfast. 90 tablet 3  . glucose blood (ACCU-CHEK AVIVA) test strip Use as instructed 100 each 12  . Insulin Detemir (LEVEMIR FLEXTOUCH) 100 UNIT/ML Pen Inject into the skin.    [name] insulin glargine (LANTUS) 100 unit/mL SOPN Inject 30 Units into the skin daily.    . Insulin Pen Needle (BD PEN NEEDLE NANO U/F) 32G X 4 MM MISC [place] TO TEST BLOOD SUGAR THREE TIMES DAILY 300 each 1  . Menthol, Topical Analgesic, (ICY HOT EX) Apply 1 application topically at bedtime as needed (leg pain).    . Multiple Vitamin (MULTIVITAMIN WITH MINERALS) TABS tablet Take 1 tablet by mouth daily.    Glory Rosebush DELICA LANCETS FINE MISC Use to test blood sugar 2-3 times daily 300 each 1  . polyethylene glycol (MIRALAX) packet Take 17 g by mouth daily. 14 each 0  . pramipexole (MIRAPEX) 0.5 MG tablet TAKE 0.44m 2-3 HOURS BEFORE SLEEPFOR RESTLESS LEG 90 tablet 3  . Semaglutide (OZEMPIC) 0.25 or 0.5 MG/DOSE SOPN Inject 0.5 mg into the skin once a week. 1.5 mL 3  . silodosin (RAPAFLO) 8 MG CAPS capsule TAKE 1 CAPSULE DAILY WITH  BREAKFAST 90 capsule 3  . spironolactone (ALDACTONE) 25 MG tablet Take 1 tablet (25 mg total) by mouth daily. 90 tablet 3  . torsemide (DEMADEX) 20 MG tablet Take 2 tablets (40 mg total) by  mouth daily. 180 tablet 1  . hydrALAZINE (APRESOLINE) 25 MG tablet Take 1.5 tablets (37.5 mg total) by mouth every 8 (eight) hours. 405 tablet 3  . isosorbide mononitrate (IMDUR) 60 MG 24 hr tablet Take 1 tablet (60 mg total) by mouth daily. 90 tablet 3   No current facility-administered medications for this encounter.     Allergies:   Patient has no known allergies.   Social History:  The patient  reports that he quit smoking about 40 years ago. His smoking use included cigarettes. He has a 40.00 pack-year smoking history. He has quit using smokeless tobacco.  His smokeless tobacco use included chew. He reports that he does not drink alcohol or use drugs.   Family History:  The patient's family history includes COPD in his father.   ROS:  Please see the history of present illness.   All other systems are personally reviewed and negative.   Exam:   BP 122/68   Pulse 68   Wt 84.3 kg (185 lb 12.8 oz)   SpO2 96%   BMI 23.86 kg/m  General: NAD Neck: No JVD, no thyromegaly or thyroid nodule.  Lungs: Clear to auscultation bilaterally with normal respiratory effort. CV: Nondisplaced PMI.  Heart regular S1/S2, no S3/S4, 2/6 SEM RUSB.  No peripheral edema.  Abdomen: Soft, nontender, no hepatosplenomegaly, no distention.  Skin: Intact without lesions or rashes.  Neurologic: Alert and oriented x 3.  Psych: Normal affect. Extremities: No clubbing or cyanosis.  HEENT: Normal.   Recent Labs: 03/26/2018: Hemoglobin 13.4; Platelets 185.0 09/22/2018: ALT <5; BUN 42; Creatinine, Ser 2.27; Potassium 5.2; Sodium 138; TSH 1.905  Personally reviewed   Wt Readings from Last 3 Encounters:  09/22/18 84.3 kg (185 lb 12.8 oz)  03/26/18 87 kg (191 lb 12.8 oz)  02/28/18 86.7 kg (191 lb 3.2 oz)      ASSESSMENT AND PLAN:  1. Chronic systolic CHF: Ischemic cardiomyopathy.  St Jude CRT-D.  Echo 10/2016 LVEF 20-25%. NYHA class II symptoms.  He is >99% BiV paced on device interrogation today.  Echo was done  today and reviewed, EF remains 20-25%.  Weight is lower and stable thoracic impedance on Corevue.  He is not volume overloaded on exam.  - Continue torsemide 40 mg daily.  Check BMET.     - Increase Imdur to 60 mg daily and hydralazine to 37.5 mg tid.  - Will leave off ACEI/ARB/ARNI for now with elevated creatinine.  - Continue spironolactone 25 mg daily.   - Continue current digoxin, check level.   - Continue Coreg 12.5 mg bid.  2. Atrial fibrillation: Paroxysmal.  ICD interrogation today showed no atrial fibrillation.   - Continue amiodarone 100 mg daily.  Check TSH and LFTs today.  He will need a regular eye exam.   - Continue Eliquis (dosed at 2.5 mg bid with age and renal dysfunction).   3. CKD: Stage 3.  - BMET today. 4. CAD: No chest pain.  - Continue on statin, good lipids in 12/19.   - He is not on ASA 81 given stable CAD and Eliquis use.   5. Carotid stenosis: [place] 5/20 with 40-59% RICA.  - Repeat carotid dopplers 5/21.  6. OSA: Continue nightly CPAP.  7. Shoulder osteoarthritis: Pain is severe.  He is interested in a shoulder replacement.  He is stable from a CHF standpoint, has had no chest pain, and is able to climb a flight of stairs without problems.  I think he could have an operation if needed though he would be of moderate risk and would suggest doing any procedure at [facility].  He would need to hold Eliquis 2 days prior to surgery.   Followup in 3 months.   Signed, [name], MD  09/23/2018  [place] [address] [facility] [place] [address] [phone] (office) [phone] (fax)

## 2018-09-23 NOTE — Telephone Encounter (Signed)
-----   Message from Larey Dresser, MD sent at 09/22/2018  3:56 PM EDT ----- Decrease torsemide to 40 daily alternating with 20 daily.  Needs repeat BMET in 1 week (sample today appears to be hemolyzed which likely explains mildly elevated K).

## 2018-09-23 NOTE — Telephone Encounter (Signed)
Relayed med changes to pt, verbalized understanding. Will repeat BMET at 7/13 appt with cardiology

## 2018-09-26 ENCOUNTER — Other Ambulatory Visit: Payer: Self-pay | Admitting: Internal Medicine

## 2018-09-26 ENCOUNTER — Telehealth: Payer: Self-pay | Admitting: Internal Medicine

## 2018-09-26 NOTE — Telephone Encounter (Signed)

## 2018-09-29 ENCOUNTER — Other Ambulatory Visit: Payer: Self-pay

## 2018-09-29 ENCOUNTER — Ambulatory Visit (INDEPENDENT_AMBULATORY_CARE_PROVIDER_SITE_OTHER): Payer: Medicare Other | Admitting: Internal Medicine

## 2018-09-29 ENCOUNTER — Encounter: Payer: Self-pay | Admitting: Internal Medicine

## 2018-09-29 VITALS — BP 124/62 | HR 61 | Ht 74.0 in | Wt 187.8 lb

## 2018-09-29 DIAGNOSIS — I480 Paroxysmal atrial fibrillation: Secondary | ICD-10-CM | POA: Diagnosis not present

## 2018-09-29 DIAGNOSIS — Z95810 Presence of automatic (implantable) cardiac defibrillator: Secondary | ICD-10-CM

## 2018-09-29 DIAGNOSIS — I5022 Chronic systolic (congestive) heart failure: Secondary | ICD-10-CM | POA: Diagnosis not present

## 2018-09-29 DIAGNOSIS — I255 Ischemic cardiomyopathy: Secondary | ICD-10-CM | POA: Diagnosis not present

## 2018-09-29 LAB — CUP PACEART INCLINIC DEVICE CHECK
Battery Remaining Longevity: 0 mo
Brady Statistic RA Percent Paced: 96 %
Brady Statistic RV Percent Paced: 99.69 %
Date Time Interrogation Session: 20200713170155
HighPow Impedance: 46.187
Implantable Lead Implant Date: 20051221
Implantable Lead Implant Date: 20150304
Implantable Lead Implant Date: 20150304
Implantable Lead Location: 753858
Implantable Lead Location: 753859
Implantable Lead Location: 753860
Implantable Lead Model: 1581
Implantable Pulse Generator Implant Date: 20150304
Lead Channel Impedance Value: 362.5 Ohm
Lead Channel Impedance Value: 375 Ohm
Lead Channel Impedance Value: 400 Ohm
Lead Channel Pacing Threshold Amplitude: 0.75 V
Lead Channel Pacing Threshold Amplitude: 0.75 V
Lead Channel Pacing Threshold Amplitude: 1.25 V
Lead Channel Pacing Threshold Amplitude: 1.25 V
Lead Channel Pacing Threshold Amplitude: 2.25 V
Lead Channel Pacing Threshold Amplitude: 2.25 V
Lead Channel Pacing Threshold Amplitude: 2.5 V
Lead Channel Pacing Threshold Pulse Width: 0.5 ms
Lead Channel Pacing Threshold Pulse Width: 0.5 ms
Lead Channel Pacing Threshold Pulse Width: 0.7 ms
Lead Channel Pacing Threshold Pulse Width: 0.7 ms
Lead Channel Pacing Threshold Pulse Width: 1 ms
Lead Channel Pacing Threshold Pulse Width: 1.2 ms
Lead Channel Pacing Threshold Pulse Width: 1.2 ms
Lead Channel Sensing Intrinsic Amplitude: 1.5 mV
Lead Channel Sensing Intrinsic Amplitude: 12 mV
Lead Channel Setting Pacing Amplitude: 2 V
Lead Channel Setting Pacing Amplitude: 2.5 V
Lead Channel Setting Pacing Amplitude: 3.25 V
Lead Channel Setting Pacing Pulse Width: 0.5 ms
Lead Channel Setting Pacing Pulse Width: 1 ms
Lead Channel Setting Sensing Sensitivity: 0.5 mV
Pulse Gen Serial Number: 7170478
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
## 2018-09-29 NOTE — Patient Instructions (Signed)
Medication Instructions:  Your physician recommends that you continue on your current medications as directed. Please refer to the Current Medication list given to you today.  Labwork: None ordered.  Testing/Procedures: None ordered.  Follow-Up: Your physician recommends that you schedule a follow-up appointment in: After your battery change out.   Any Other Special Instructions Will Be Listed Below (If Applicable).   Here are available dates for your battery change:  July 22 July 24 July 27 Aug 10 Aug 19 Aug 26 Aug 31  If you need a refill on your cardiac medications before your next appointment, please call your pharmacy.
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
## 2018-09-30 ENCOUNTER — Telehealth: Payer: Self-pay | Admitting: Internal Medicine

## 2018-09-30 NOTE — Telephone Encounter (Signed)
New Message:     Patient calling stating he need to speak with a nurse concerning some medication and to talk about his surgery. Patient would like to have his surgery on 10/27/18. Please callback.

## 2018-09-30 NOTE — Telephone Encounter (Signed)
Spoke with pt's wife regarding change out. Pt is scheduled for 8/10 @ 9:30am. Pt's wife understands I will call her on 7/15 with pre procedure instructions.

## 2018-10-01 ENCOUNTER — Ambulatory Visit (HOSPITAL_COMMUNITY)
Admission: RE | Admit: 2018-10-01 | Discharge: 2018-10-01 | Disposition: A | Discharge status: Home / Self Care | Payer: Medicare Other | Source: Ambulatory Visit | Attending: Cardiology | Admitting: Cardiology

## 2018-10-01 ENCOUNTER — Ambulatory Visit (INDEPENDENT_AMBULATORY_CARE_PROVIDER_SITE_OTHER): Payer: Medicare Other | Admitting: Podiatry

## 2018-10-01 ENCOUNTER — Telehealth (HOSPITAL_COMMUNITY): Payer: Self-pay | Admitting: Cardiology

## 2018-10-01 ENCOUNTER — Other Ambulatory Visit: Payer: Self-pay

## 2018-10-01 ENCOUNTER — Encounter: Payer: Self-pay | Admitting: Podiatry

## 2018-10-01 VITALS — Temp 97.3°F

## 2018-10-01 DIAGNOSIS — Z95810 Presence of automatic (implantable) cardiac defibrillator: Secondary | ICD-10-CM

## 2018-10-01 DIAGNOSIS — I5032 Chronic diastolic (congestive) heart failure: Secondary | ICD-10-CM

## 2018-10-01 DIAGNOSIS — D689 Coagulation defect, unspecified: Secondary | ICD-10-CM

## 2018-10-01 DIAGNOSIS — M79676 Pain in unspecified toe(s): Secondary | ICD-10-CM

## 2018-10-01 DIAGNOSIS — E1140 Type 2 diabetes mellitus with diabetic neuropathy, unspecified: Secondary | ICD-10-CM | POA: Diagnosis not present

## 2018-10-01 DIAGNOSIS — I480 Paroxysmal atrial fibrillation: Secondary | ICD-10-CM

## 2018-10-01 DIAGNOSIS — B351 Tinea unguium: Secondary | ICD-10-CM | POA: Diagnosis not present

## 2018-10-01 DIAGNOSIS — I5022 Chronic systolic (congestive) heart failure: Secondary | ICD-10-CM | POA: Insufficient documentation

## 2018-10-01 LAB — BASIC METABOLIC PANEL
Anion gap: 11 (ref 5–15)
BUN: 28 mg/dL — ABNORMAL HIGH (ref 8–23)
CO2: 25 mmol/L (ref 22–32)
Calcium: 8.9 mg/dL (ref 8.9–10.3)
Chloride: 103 mmol/L (ref 98–111)
Creatinine, Ser: 2.09 mg/dL — ABNORMAL HIGH (ref 0.61–1.24)
GFR calc Af Amer: 32 mL/min — ABNORMAL LOW (ref >60)
GFR calc non Af Amer: 27 mL/min — ABNORMAL LOW (ref >60)
Glucose, Bld: 195 mg/dL — ABNORMAL HIGH (ref 70–99)
Potassium: 3.9 mmol/L (ref 3.5–5.1)
Sodium: 139 mmol/L (ref 135–145)

## 2018-10-01 NOTE — Telephone Encounter (Signed)
Spoke with wife and reviewed pre procedure instructions including labs and COVID screening. She will be sent an instruction letter via [place]. She understands if she has any questions, she can call the office.

## 2018-10-01 NOTE — Telephone Encounter (Signed)
Patient called with concerns regarding bilateral foot swelling.  Diuretics we recently cut back and patient felt this could be where swelling is coming from. Advised before further medication changes are made, will need labs repeated as ordered by dr [name]  Pt agreeable Repeat labs 7/15

## 2018-10-01 NOTE — Progress Notes (Signed)
Patient ID: PRENTISS POLIO, male   DOB: 08/04/1928, 85 y.o.   MRN: [id number] Complaint:  Visit Type: Patient returns to my office for continued preventative foot care services. Complaint: Patient states" my nails have grown long and thick and become painful to walk and wear shoes" Patient has been diagnosed with DM with neuropathy.[name] He presents for preventative foot care services. No changes to ROS.  He also has injured his second toenail left foot and his fifth toenail right foot.   Patient is taking eliquiss.  Podiatric Exam: Vascular: dorsalis pedis and posterior tibial pulses are palpable bilateral. Capillary return is immediate. Temperature gradient is WNL. Skin turgor WNL  Sensorium: Diminished  Semmes Weinstein monofilament test. Normal tactile sensation bilaterally. Nail Exam: Pt has thick disfigured discolored nails with subungual debris noted bilateral entire nail hallux through fifth toenails Ulcer Exam: There is no evidence of ulcer or pre-ulcerative changes or infection. Orthopedic Exam: Muscle tone and strength are WNL. No limitations in general ROM. No crepitus or effusions noted. Foot type and digits show no abnormalities. Bony prominences are unremarkable. Asymptomatic HAV B/L. Skin:  Porokeratosis sub 5th metatarsal bilateral.. No infection or ulcers.   Diagnosis:  Tinea unguium, Pain in right toe, pain in left toes ,   Treatment & Plan Procedures and Treatment: Consent by patient was obtained for treatment procedures. The patient understood the discussion of treatment and procedures well. All questions were answered thoroughly reviewed. Debridement of mycotic and hypertrophic toenails, 1 through 5 bilateral and clearing of subungual debris. No ulceration, no infection noted.  Two injured nails were cleaned and nail resected. Return Visit-Office Procedure: Patient instructed to return to the office for a follow up visit 10 weeks  for continued evaluation and treatment.   Gardiner Barefoot DPM

## 2018-10-03 ENCOUNTER — Telehealth (HOSPITAL_COMMUNITY): Payer: Self-pay

## 2018-10-03 ENCOUNTER — Other Ambulatory Visit (HOSPITAL_COMMUNITY): Payer: Self-pay

## 2018-10-03 DIAGNOSIS — I5022 Chronic systolic (congestive) heart failure: Secondary | ICD-10-CM

## 2018-10-03 NOTE — Progress Notes (Signed)
Pt wife aware that pt can begin taking 40 mg torsemide daily and will repeat bmet next week per DM

## 2018-10-03 NOTE — Telephone Encounter (Signed)
Pt called requesting recent lab results and inquired about med changes due to ankle swelling, BMET has resulted.   7/15 Patient called with concerns regarding bilateral foot swelling.  Diuretics we recently cut back and patient felt this could be where swelling is coming from. Advised before further medication changes are made, will need labs repeated as ordered by dr [name]  Pt agreeable Repeat labs 7/15

## 2018-10-03 NOTE — Telephone Encounter (Signed)
Can increase torsemide back to 40 mg daily but will need BMET next week.

## 2018-10-08 ENCOUNTER — Other Ambulatory Visit: Payer: Self-pay | Admitting: Internal Medicine

## 2018-10-09 ENCOUNTER — Other Ambulatory Visit: Payer: Self-pay

## 2018-10-09 ENCOUNTER — Ambulatory Visit (HOSPITAL_COMMUNITY)
Admission: RE | Admit: 2018-10-09 | Discharge: 2018-10-09 | Disposition: A | Discharge status: Home / Self Care | Payer: Medicare Other | Source: Ambulatory Visit | Attending: Cardiology | Admitting: Cardiology

## 2018-10-09 DIAGNOSIS — I5022 Chronic systolic (congestive) heart failure: Secondary | ICD-10-CM | POA: Diagnosis not present

## 2018-10-09 LAB — BASIC METABOLIC PANEL
Anion gap: 12 (ref 5–15)
BUN: 35 mg/dL — ABNORMAL HIGH (ref 8–23)
CO2: 25 mmol/L (ref 22–32)
Calcium: 8.9 mg/dL (ref 8.9–10.3)
Chloride: 103 mmol/L (ref 98–111)
Creatinine, Ser: 2.04 mg/dL — ABNORMAL HIGH (ref 0.61–1.24)
GFR calc Af Amer: 33 mL/min — ABNORMAL LOW (ref >60)
GFR calc non Af Amer: 28 mL/min — ABNORMAL LOW (ref >60)
Glucose, Bld: 244 mg/dL — ABNORMAL HIGH (ref 70–99)
Potassium: 3.8 mmol/L (ref 3.5–5.1)
Sodium: 140 mmol/L (ref 135–145)

## 2018-10-14 NOTE — Telephone Encounter (Signed)
Per Dr. Caryl Comes, pt is to not stop his eliquis prior to his procedure. Pt's wife has verbalized understanding and had no additional questions.

## 2018-10-21 ENCOUNTER — Other Ambulatory Visit: Payer: Self-pay | Admitting: Internal Medicine

## 2018-10-24 ENCOUNTER — Other Ambulatory Visit (HOSPITAL_COMMUNITY)
Admission: RE | Admit: 2018-10-24 | Discharge: 2018-10-24 | Disposition: A | Discharge status: Home / Self Care | Payer: Medicare Other | Source: Ambulatory Visit | Attending: Internal Medicine | Admitting: Internal Medicine

## 2018-10-24 ENCOUNTER — Other Ambulatory Visit: Payer: Self-pay

## 2018-10-24 ENCOUNTER — Other Ambulatory Visit: Payer: Medicare Other | Admitting: *Deleted

## 2018-10-24 DIAGNOSIS — I5032 Chronic diastolic (congestive) heart failure: Secondary | ICD-10-CM

## 2018-10-24 DIAGNOSIS — Z20828 Contact with and (suspected) exposure to other viral communicable diseases: Secondary | ICD-10-CM | POA: Insufficient documentation

## 2018-10-24 DIAGNOSIS — I480 Paroxysmal atrial fibrillation: Secondary | ICD-10-CM | POA: Diagnosis not present

## 2018-10-24 DIAGNOSIS — Z95810 Presence of automatic (implantable) cardiac defibrillator: Secondary | ICD-10-CM | POA: Diagnosis not present

## 2018-10-24 DIAGNOSIS — Z01812 Encounter for preprocedural laboratory examination: Secondary | ICD-10-CM | POA: Insufficient documentation

## 2018-10-24 LAB — BASIC METABOLIC PANEL
BUN/Creatinine Ratio: 17 (ref 10–24)
BUN: 34 mg/dL — ABNORMAL HIGH (ref 8–27)
CO2: 25 mmol/L (ref 20–29)
Calcium: 9.2 mg/dL (ref 8.6–10.2)
Chloride: 100 mmol/L (ref 96–106)
Creatinine, Ser: 2.04 mg/dL — ABNORMAL HIGH (ref 0.76–1.27)
GFR calc Af Amer: 33 mL/min/{1.73_m2} — ABNORMAL LOW (ref >59)
GFR calc non Af Amer: 28 mL/min/{1.73_m2} — ABNORMAL LOW (ref >59)
Glucose: 170 mg/dL — ABNORMAL HIGH (ref 65–99)
Potassium: 4.3 mmol/L (ref 3.5–5.2)
Sodium: 140 mmol/L (ref 134–144)

## 2018-10-24 LAB — CBC
Hematocrit: 37.1 % — ABNORMAL LOW (ref 37.5–51.0)
Hemoglobin: 12.4 g/dL — ABNORMAL LOW (ref 13.0–17.7)
MCH: 31.5 pg (ref 26.6–33.0)
MCHC: 33.4 g/dL (ref 31.5–35.7)
MCV: 94 fL (ref 79–97)
Platelets: 175 10*3/uL (ref 150–450)
RBC: 3.94 x10E6/uL — ABNORMAL LOW (ref 4.14–5.80)
RDW: 12 % (ref 11.6–15.4)
WBC: 10.7 10*3/uL (ref 3.4–10.8)

## 2018-10-24 LAB — SARS CORONAVIRUS 2 (TAT 6-24 HRS): SARS Coronavirus 2: NEGATIVE

## 2018-10-27 ENCOUNTER — Ambulatory Visit (HOSPITAL_COMMUNITY)
Admission: RE | Admit: 2018-10-27 | Discharge: 2018-10-27 | Disposition: A | Discharge status: Home / Self Care | Payer: Medicare Other | Attending: Internal Medicine | Admitting: Internal Medicine

## 2018-10-27 ENCOUNTER — Ambulatory Visit (HOSPITAL_COMMUNITY)
Admission: RE | Disposition: A | Discharge status: Home / Self Care | Payer: Medicare Other | Source: Home / Self Care | Attending: Internal Medicine

## 2018-10-27 ENCOUNTER — Other Ambulatory Visit: Payer: Self-pay

## 2018-10-27 ENCOUNTER — Other Ambulatory Visit (HOSPITAL_COMMUNITY): Payer: Self-pay | Admitting: Cardiology

## 2018-10-27 ENCOUNTER — Encounter (HOSPITAL_COMMUNITY): Payer: Self-pay | Admitting: Internal Medicine

## 2018-10-27 DIAGNOSIS — I509 Heart failure, unspecified: Secondary | ICD-10-CM | POA: Diagnosis not present

## 2018-10-27 DIAGNOSIS — I447 Left bundle-branch block, unspecified: Secondary | ICD-10-CM | POA: Diagnosis not present

## 2018-10-27 DIAGNOSIS — Z45010 Encounter for checking and testing of cardiac pacemaker pulse generator [battery]: Secondary | ICD-10-CM | POA: Insufficient documentation

## 2018-10-27 DIAGNOSIS — G2581 Restless legs syndrome: Secondary | ICD-10-CM | POA: Diagnosis not present

## 2018-10-27 DIAGNOSIS — I255 Ischemic cardiomyopathy: Secondary | ICD-10-CM | POA: Insufficient documentation

## 2018-10-27 DIAGNOSIS — E1122 Type 2 diabetes mellitus with diabetic chronic kidney disease: Secondary | ICD-10-CM | POA: Insufficient documentation

## 2018-10-27 DIAGNOSIS — I4589 Other specified conduction disorders: Secondary | ICD-10-CM | POA: Insufficient documentation

## 2018-10-27 DIAGNOSIS — G4733 Obstructive sleep apnea (adult) (pediatric): Secondary | ICD-10-CM | POA: Insufficient documentation

## 2018-10-27 DIAGNOSIS — I480 Paroxysmal atrial fibrillation: Secondary | ICD-10-CM | POA: Insufficient documentation

## 2018-10-27 DIAGNOSIS — M1990 Unspecified osteoarthritis, unspecified site: Secondary | ICD-10-CM | POA: Insufficient documentation

## 2018-10-27 DIAGNOSIS — I252 Old myocardial infarction: Secondary | ICD-10-CM | POA: Diagnosis not present

## 2018-10-27 DIAGNOSIS — N189 Chronic kidney disease, unspecified: Secondary | ICD-10-CM | POA: Insufficient documentation

## 2018-10-27 DIAGNOSIS — F329 Major depressive disorder, single episode, unspecified: Secondary | ICD-10-CM | POA: Diagnosis not present

## 2018-10-27 DIAGNOSIS — I2510 Atherosclerotic heart disease of native coronary artery without angina pectoris: Secondary | ICD-10-CM

## 2018-10-27 DIAGNOSIS — E559 Vitamin D deficiency, unspecified: Secondary | ICD-10-CM | POA: Insufficient documentation

## 2018-10-27 DIAGNOSIS — E785 Hyperlipidemia, unspecified: Secondary | ICD-10-CM | POA: Insufficient documentation

## 2018-10-27 DIAGNOSIS — Z951 Presence of aortocoronary bypass graft: Secondary | ICD-10-CM | POA: Diagnosis not present

## 2018-10-27 DIAGNOSIS — I5022 Chronic systolic (congestive) heart failure: Secondary | ICD-10-CM | POA: Diagnosis not present

## 2018-10-27 DIAGNOSIS — I130 Hypertensive heart and chronic kidney disease with heart failure and stage 1 through stage 4 chronic kidney disease, or unspecified chronic kidney disease: Secondary | ICD-10-CM | POA: Diagnosis not present

## 2018-10-27 DIAGNOSIS — Z006 Encounter for examination for normal comparison and control in clinical research program: Secondary | ICD-10-CM | POA: Insufficient documentation

## 2018-10-27 HISTORY — PX: BIV PACEMAKER INSERTION CRT-P: EP1199

## 2018-10-27 LAB — GLUCOSE, CAPILLARY
Glucose-Capillary: 128 mg/dL — ABNORMAL HIGH (ref 70–99)
Glucose-Capillary: 89 mg/dL (ref 70–99)

## 2018-10-27 LAB — SURGICAL PCR SCREEN
MRSA, PCR: NEGATIVE
Staphylococcus aureus: NEGATIVE

## 2018-10-27 SURGERY — BIV PACEMAKER INSERTION CRT-P

## 2018-10-27 MED ORDER — SODIUM CHLORIDE 0.9 % IV SOLN
INTRAVENOUS | Status: DC
  Administered 2018-10-27: 09:00:00 via INTRAVENOUS

## 2018-10-27 MED ORDER — CHLORHEXIDINE GLUCONATE 4 % EX LIQD: 60.0000 mL | Freq: Once | CUTANEOUS | Status: DC

## 2018-10-27 MED ORDER — HEPARIN (PORCINE) IN NACL 1000-0.9 UT/500ML-% IV SOLN
INTRAVENOUS | Status: AC
  Filled 2018-10-27: qty 500

## 2018-10-27 MED ORDER — CEFAZOLIN SODIUM-DEXTROSE 2-4 GM/100ML-% IV SOLN
INTRAVENOUS | Status: AC
  Filled 2018-10-27: qty 100

## 2018-10-27 MED ORDER — ACETAMINOPHEN 325 MG PO TABS: 325.0000 mg | ORAL_TABLET | ORAL | Status: DC | PRN

## 2018-10-27 MED ORDER — LIDOCAINE HCL (PF) 1 % IJ SOLN
INTRAMUSCULAR | Status: DC | PRN
  Administered 2018-10-27: 40 mL

## 2018-10-27 MED ORDER — CEFAZOLIN SODIUM-DEXTROSE 2-4 GM/100ML-% IV SOLN
2.0000 g | INTRAVENOUS | Status: AC
  Administered 2018-10-27: 2 g via INTRAVENOUS

## 2018-10-27 MED ORDER — SODIUM CHLORIDE 0.9 % IV SOLN
80.0000 mg | INTRAVENOUS | Status: AC
  Administered 2018-10-27: 80 mg

## 2018-10-27 MED ORDER — SODIUM CHLORIDE 0.9 % IV SOLN
INTRAVENOUS | Status: AC
  Filled 2018-10-27: qty 2

## 2018-10-27 MED ORDER — LIDOCAINE HCL 1 % IJ SOLN
INTRAMUSCULAR | Status: AC
  Filled 2018-10-27: qty 60

## 2018-10-27 MED ORDER — MUPIROCIN 2 % EX OINT
TOPICAL_OINTMENT | CUTANEOUS | Status: AC
  Administered 2018-10-27: 09:00:00
  Filled 2018-10-27: qty 22

## 2018-10-27 MED ORDER — ONDANSETRON HCL 4 MG/2ML IJ SOLN: 4.0000 mg | Freq: Four times a day (QID) | INTRAMUSCULAR | Status: DC | PRN

## 2018-10-27 SURGICAL SUPPLY — 6 items
CABLE SURGICAL S-101-97-12 (CABLE) ×2 IMPLANT
HEMOSTAT SURGICEL 2X4 FIBR (HEMOSTASIS) ×4 IMPLANT
PACEMAKER QUDR ALLR CRT PM3562 (Pacemaker) ×1 IMPLANT
PAD PRO RADIOLUCENT 2001M-C (PAD) ×2 IMPLANT
PMKR QUADRA ALLURE CRT PM3562 (Pacemaker) ×2 IMPLANT
TRAY PACEMAKER INSERTION (PACKS) ×2 IMPLANT

## 2018-10-27 NOTE — Interval H&P Note (Signed)
History and Physical Interval Note:  10/27/2018 7:42 AM  [name]  has presented today for surgery, with the diagnosis of eri.  The various methods of treatment have been discussed with the patient and family. After consideration of risks, benefits and other options for treatment, the patient has consented to  Procedure(s): BIV PACEMAKER INSERTION CRT-P (N/A) as a surgical intervention.  The patient's history has been reviewed, patient examined, no change in status, stable for surgery.  I have reviewed the patient's chart and labs.  Questions were answered to the patient's satisfaction.     Virl Axe

## 2018-10-27 NOTE — Discharge Instructions (Signed)
Post procedure care instructions Keep incision clean and dry for 10 days. No driving for 2 days.  You can remove outer dressing tomorrow. Leave steri-strips (little pieces of tape) on until seen in the office for wound check appointment. Call the office [phone]) for redness, drainage, swelling, or fever.   Pacemaker Battery Change, Care After This sheet gives you information about how to care for yourself after your procedure. Your health care provider may also give you more specific instructions. If you have problems or questions, contact your health care provider. What can I expect after the procedure? After your procedure, it is common to have:  Pain or soreness at the site where the pacemaker was inserted.  Swelling at the site where the pacemaker was inserted. Follow these instructions at home: Incision care  Keep the incision clean and dry. ? Do not take baths, swim, or use a hot tub until your health care provider approves. ? You may shower the day after your procedure, or as directed by your health care provider. ? Pat the area dry with a clean towel. Do not rub the area. This may cause bleeding.  Follow instructions from your health care provider about how to take care of your incision. Make sure you: ? Wash your hands with soap and water before you change your bandage (dressing). If soap and water are not available, use hand sanitizer. ? Change your dressing as told by your health care provider. ? Leave stitches (sutures), skin glue, or adhesive strips in place. These skin closures may need to stay in place for 2 weeks or longer. If adhesive strip edges start to loosen and curl up, you may trim the loose edges. Do not remove adhesive strips completely unless your health care provider tells you to do that.  Check your incision area every day for signs of infection. Check for: ? More redness, swelling, or pain. ? More fluid or blood. ? Warmth. ? Pus or a bad  smell. Activity  Do not lift anything that is heavier than 10 lb (4.5 kg) until your health care provider says it is okay to do so.  For the first 2 weeks, or as long as told by your health care provider: ? Avoid lifting your left arm higher than your shoulder. ? Be gentle when you move your arms over your head. It is okay to raise your arm to comb your hair. ? Avoid strenuous exercise.  Ask your health care provider when it is okay to: ? Resume your normal activities. ? Return to work or school. ? Resume sexual activity. Eating and drinking  Eat a heart-healthy diet. This should include plenty of fresh fruits and vegetables, whole grains, low-fat dairy products, and lean protein like chicken and fish.  Limit alcohol intake to no more than 1 drink a day for non-pregnant women and 2 drinks a day for men. One drink equals 12 oz of beer, 5 oz of wine, or 1 oz of hard liquor.  Check ingredients and nutrition facts on packaged foods and beverages. Avoid the following types of food: ? Food that is high in salt (sodium). ? Food that is high in saturated fat, like full-fat dairy or red meat. ? Food that is high in trans fat, like fried food. ? Food and drinks that are high in sugar. Lifestyle  Do not use any products that contain nicotine or tobacco, such as cigarettes and e-cigarettes. If you need help quitting, ask your health care provider.  Take  steps to manage and control your weight.  Get regular exercise. Aim for 150 minutes of moderate-intensity exercise (such as walking or yoga) or 75 minutes of vigorous exercise (such as running or swimming) each week.  Manage other health problems, such as diabetes or high blood pressure. Ask your health care provider how you can manage these conditions. General instructions  Do not drive for 24 hours after your procedure if you were given a medicine to help you relax (sedative).  Take over-the-counter and prescription medicines only as told  by your health care provider.  Avoid putting pressure on the area where the pacemaker was placed.  If you need an MRI after your pacemaker has been placed, be sure to tell the health care provider who orders the MRI that you have a pacemaker.  Avoid close and prolonged exposure to electrical devices that have strong magnetic fields. These include: ? Cell phones. Avoid keeping them in a pocket near the pacemaker, and try using the ear opposite the pacemaker. ? MP3 players. ? Household appliances, like microwaves. ? Metal detectors. ? Electric generators. ? High-tension wires.  Keep all follow-up visits as directed by your health care provider. This is important. Contact a health care provider if:  You have pain at the incision site that is not relieved by over-the-counter or prescription medicines.  You have any of these around your incision site or coming from it: ? More redness, swelling, or pain. ? Fluid or blood. ? Warmth to the touch. ? Pus or a bad smell.  You have a fever.  You feel brief, occasional palpitations, light-headedness, or any symptoms that you think might be related to your heart. Get help right away if:  You experience chest pain that is different from the pain at the pacemaker site.  You develop a red streak that extends above or below the incision site.  You experience shortness of breath.  You have palpitations or an irregular heartbeat.  You have light-headedness that does not go away quickly.  You faint or have dizzy spells.  Your pulse suddenly drops or increases rapidly and does not return to normal.  You begin to gain weight and your legs and ankles swell. Summary  After your procedure, it is common to have pain, soreness, and some swelling where the pacemaker was inserted.  Make sure to keep your incision clean and dry. Follow instructions from your health care provider about how to take care of your incision.  Check your incision every  day for signs of infection, such as more pain or swelling, pus or a bad smell, warmth, or leaking fluid and blood.  Avoid strenuous exercise and lifting your left arm higher than your shoulder for 2 weeks, or as long as told by your health care provider. This information is not intended to replace advice given to you by your health care provider. Make sure you discuss any questions you have with your health care provider. Document Released: 12/24/2012 Document Revised: 02/15/2017 Document Reviewed: 01/26/2016 Elsevier Patient Education  2020 [employer].

## 2018-10-27 NOTE — Discharge Instr - Supplementary Instructions (Signed)
Keep incision site dry for 7 days. Keep steri stirps in place until follow up Appointment. Remove outer dressing tomorrow evening.

## 2018-10-28 MED FILL — Lidocaine HCl Local Inj 1%: INTRAMUSCULAR | Qty: 20 | Status: AC

## 2018-10-28 MED FILL — Gentamicin Sulfate Inj 40 MG/ML: INTRAMUSCULAR | Qty: 80 | Status: AC

## 2018-10-28 MED FILL — Lidocaine HCl Local Inj 1%: INTRAMUSCULAR | Qty: 40 | Status: AC

## 2018-11-03 ENCOUNTER — Telehealth: Payer: Self-pay

## 2018-11-03 NOTE — Telephone Encounter (Signed)
    COVID-19 Pre-Screening Questions:  . In the past 7 to 10 days have you had a cough,  shortness of breath, headache, congestion, fever (100 or greater) body aches, chills, sore throat, or sudden loss of taste or sense of smell? NO . Have you been around anyone with known Covid 19. NO . Have you been around anyone who is awaiting Covid 19 test results in the past 7 to 10 days?No . Have you been around anyone who has been exposed to Covid 19, or has mentioned symptoms of Covid 19 within the past 7 to 10 days? No  If you have any concerns/questions about symptoms patients report during screening (either on the phone or at threshold). Contact the provider seeing the patient or DOD for further guidance.  If neither are available contact a member of the leadership team.        The pt wife answered no to all covid-19 prescreening questions. I asked her to make sure the pt wear a mask to his appointment. I told her we are reducing the number of people coming into the office and if the pt can physically come to his appointment alone to please do so. The pt wife verbalized understanding.

## 2018-11-04 ENCOUNTER — Telehealth: Payer: Self-pay

## 2018-11-04 ENCOUNTER — Ambulatory Visit (INDEPENDENT_AMBULATORY_CARE_PROVIDER_SITE_OTHER): Payer: Medicare Other | Admitting: *Deleted

## 2018-11-04 ENCOUNTER — Other Ambulatory Visit: Payer: Self-pay

## 2018-11-04 DIAGNOSIS — I255 Ischemic cardiomyopathy: Secondary | ICD-10-CM

## 2018-11-04 LAB — CUP PACEART INCLINIC DEVICE CHECK
Battery Remaining Longevity: 45 mo
Battery Voltage: 3.01 V
Brady Statistic RA Percent Paced: 97 %
Brady Statistic RV Percent Paced: 99.68 %
Date Time Interrogation Session: 20200818120743
Implantable Lead Implant Date: 20051221
Implantable Lead Implant Date: 20150304
Implantable Lead Implant Date: 20150304
Implantable Lead Location: 753858
Implantable Lead Location: 753859
Implantable Lead Location: 753860
Implantable Lead Model: 1581
Implantable Pulse Generator Implant Date: 20200810
Lead Channel Impedance Value: 375 Ohm
Lead Channel Impedance Value: 400 Ohm
Lead Channel Impedance Value: 425 Ohm
Lead Channel Pacing Threshold Amplitude: 0.75 V
Lead Channel Pacing Threshold Amplitude: 0.75 V
Lead Channel Pacing Threshold Amplitude: 1.25 V
Lead Channel Pacing Threshold Amplitude: 1.25 V
Lead Channel Pacing Threshold Amplitude: 2.25 V
Lead Channel Pacing Threshold Amplitude: 2.25 V
Lead Channel Pacing Threshold Pulse Width: 0.5 ms
Lead Channel Pacing Threshold Pulse Width: 0.5 ms
Lead Channel Pacing Threshold Pulse Width: 0.5 ms
Lead Channel Pacing Threshold Pulse Width: 0.5 ms
Lead Channel Pacing Threshold Pulse Width: 1 ms
Lead Channel Pacing Threshold Pulse Width: 1 ms
Lead Channel Sensing Intrinsic Amplitude: 12 mV
Lead Channel Sensing Intrinsic Amplitude: 2.5 mV
Lead Channel Setting Pacing Amplitude: 2 V
Lead Channel Setting Pacing Amplitude: 2.5 V
Lead Channel Setting Pacing Amplitude: 3 V
Lead Channel Setting Pacing Pulse Width: 0.5 ms
Lead Channel Setting Pacing Pulse Width: 1 ms
Lead Channel Setting Sensing Sensitivity: 2 mV
Pulse Gen Serial Number: 9129173

## 2018-11-04 NOTE — Patient Instructions (Addendum)
Per Dr. Curt Bears, take Torsemide twice a day for 3 days. Do not take Eliquis for 5 days. Restart Eliquis on 11/10/18 (Monday).

## 2018-11-04 NOTE — Telephone Encounter (Signed)
Spoke to pt wife per pt request, informed her of Dr. Curt Bears changes. Per Dr. Curt Bears, take torsemide 40mg  BID x3 days and hold Eliquis x5 days. Can resume Eliquis 11/10/18.

## 2018-11-04 NOTE — Progress Notes (Signed)
Wound check appointment. Steri-strips removed. Wound without redness. Moderate edema around incision, hold Eliquis x5 days per WC. Pt also has swelling in ankles, take torsemide BID x3 days per WC. Incision edges approximated, wound well healed. Normal device function. Thresholds, sensing, and impedances consistent with implant measurements. Device programmed at chronic outputs. Histogram distribution appropriate for patient and level of activity. No mode switches or high ventricular rates noted. Patient educated about wound care, arm mobility, lifting restrictions. ROV w/ SK 02/02/19.

## 2018-11-12 ENCOUNTER — Other Ambulatory Visit (HOSPITAL_COMMUNITY): Payer: Self-pay

## 2018-11-12 MED ORDER — DIGOXIN 125 MCG PO TABS: ORAL_TABLET | ORAL | 3 refills | Status: DC

## 2018-11-19 ENCOUNTER — Telehealth: Payer: Self-pay | Admitting: Family Medicine

## 2018-11-19 DIAGNOSIS — Z85828 Personal history of other malignant neoplasm of skin: Secondary | ICD-10-CM | POA: Diagnosis not present

## 2018-11-19 DIAGNOSIS — L570 Actinic keratosis: Secondary | ICD-10-CM | POA: Diagnosis not present

## 2018-11-19 DIAGNOSIS — L819 Disorder of pigmentation, unspecified: Secondary | ICD-10-CM | POA: Diagnosis not present

## 2018-11-19 DIAGNOSIS — M19019 Primary osteoarthritis, unspecified shoulder: Secondary | ICD-10-CM

## 2018-11-19 NOTE — Telephone Encounter (Signed)
Shoulder gel injection April 27 helped until about a month ago.  Would like to try another.  Insurance likely won't approve until after October 27, but will try to get authorization.  Meanwhile, could inject with cortisone or dextrose (prolotherapy) until then if needed.

## 2018-11-19 NOTE — Telephone Encounter (Signed)
Patient called wanting to speak to Dr. Junius Roads in regards getting an injection in his shoulder.  CB#[phone].  Thank you.

## 2018-11-20 NOTE — Telephone Encounter (Signed)
Please advise. Thank you

## 2018-11-20 NOTE — Telephone Encounter (Signed)
Need your help, how did patient provide his own Monovisc injection last time?  I don't see notes and I was not sure how to proceed this time.  I am sure patient will expect to do same as last time.

## 2018-11-20 NOTE — Telephone Encounter (Signed)
If he is the one I am remembering correctly, he was given a sample because he could not afford the copay from his insurance.

## 2018-11-25 NOTE — Telephone Encounter (Signed)
That’s fine with me

## 2018-11-25 NOTE — Telephone Encounter (Signed)
Dr Junius Roads, if patient is ok with it, can we use Visco-3 for the gel injection to his shoulder?  Three visits, but it will be the most affordable for him.

## 2018-11-27 NOTE — Telephone Encounter (Signed)
I called and s/w wife, she says patient will be ok to do the Visco-3, and wants to set this up.  I will call her back to schedule these appts on Dr Junius Roads' schedule.  We will need to bill patient for the meds, not insurance.

## 2018-12-04 NOTE — Telephone Encounter (Signed)
A regular 20 minute slot will be fine.

## 2018-12-04 NOTE — Telephone Encounter (Signed)
Just to clarify, patient needs to be on your schedule for these injections, correct?  We will be doing the Visco-3, so three visits needed.  Do you need extra time?

## 2018-12-08 ENCOUNTER — Telehealth: Payer: Self-pay | Admitting: Family Medicine

## 2018-12-08 NOTE — Telephone Encounter (Signed)
Patient called. He would like to talk to Dr. Junius Roads about an injection. His call back number is [phone]

## 2018-12-08 NOTE — Telephone Encounter (Signed)
I called, NA, will try again.

## 2018-12-08 NOTE — Telephone Encounter (Signed)
I scheduled all 3 of the appointments, starting tomorrow morning (9/22, 9/29, & 10/06). Please call the patient's wife to let her know if they have to bring any money to the office each visit (we have been billing patients their co-pays). She would like to know how much to expect to pay.

## 2018-12-09 ENCOUNTER — Ambulatory Visit (INDEPENDENT_AMBULATORY_CARE_PROVIDER_SITE_OTHER): Payer: Medicare Other | Admitting: Family Medicine

## 2018-12-09 ENCOUNTER — Encounter: Payer: Self-pay | Admitting: Family Medicine

## 2018-12-09 DIAGNOSIS — M19019 Primary osteoarthritis, unspecified shoulder: Secondary | ICD-10-CM | POA: Diagnosis not present

## 2018-12-09 DIAGNOSIS — G8929 Other chronic pain: Secondary | ICD-10-CM

## 2018-12-09 DIAGNOSIS — M25511 Pain in right shoulder: Secondary | ICD-10-CM

## 2018-12-09 NOTE — Progress Notes (Signed)
Subjective: Patient is here for ultrasound-guided intra-articular right glenohumeral injection.  Last gel injection helped until about a month ago.  Objective:  Pain and decreased right shoulder ROM.  Procedure: Ultrasound-guided right glenohumeral injection: After sterile prep with Betadine, injected 5 cc 1% lidocaine without epinephrine and 2.5 mL of VISCO-3 using a 22-gauge spinal needle, passing the needle through approach into the glenohumeral joint.  Injectate seen filling joint capsule.

## 2018-12-16 ENCOUNTER — Ambulatory Visit (INDEPENDENT_AMBULATORY_CARE_PROVIDER_SITE_OTHER): Payer: Medicare Other | Admitting: Family Medicine

## 2018-12-16 ENCOUNTER — Encounter: Payer: Self-pay | Admitting: Family Medicine

## 2018-12-16 DIAGNOSIS — G8929 Other chronic pain: Secondary | ICD-10-CM

## 2018-12-16 DIAGNOSIS — M25511 Pain in right shoulder: Secondary | ICD-10-CM | POA: Diagnosis not present

## 2018-12-16 NOTE — Progress Notes (Signed)
Subjective: Patient is here for ultrasound-guided intra-articular right glenohumeral injection.  Pain improved after first Visco-3 injection.    Objective:  Improved AROM today.  Procedure: Ultrasound-guided right glenohumeral injection: After sterile prep with Betadine, injected 8 cc 1% lidocaine without epinephrine and Visco-3 using a 22-gauge spinal needle, passing the needle through approach into the glenohumeral joint.  Injectate seen filling joint capsule. Return in 1 week for the final one.

## 2018-12-17 ENCOUNTER — Telehealth: Payer: Self-pay | Admitting: *Deleted

## 2018-12-17 NOTE — Telephone Encounter (Signed)
Called pt and told them they would need to sign a medical release form

## 2018-12-17 NOTE — Telephone Encounter (Signed)
Copied from [place] [id number]. Topic: General - Other >> Dec 17, 2018 10:48 AM Keene Breath wrote: Reason for CRM: Patient called to inform the doctor that the [facility] in [place] would like for her to send the patient's shot records to the [place].  If there are any questions, please call patient to discuss at [phone] >> Dec 17, 2018 10:57 AM Lennox Solders wrote: Dr Domenica Fail phone number at [facility] [phone]

## 2018-12-23 ENCOUNTER — Ambulatory Visit (INDEPENDENT_AMBULATORY_CARE_PROVIDER_SITE_OTHER): Payer: Medicare Other | Admitting: Family Medicine

## 2018-12-23 ENCOUNTER — Encounter: Payer: Self-pay | Admitting: Family Medicine

## 2018-12-23 DIAGNOSIS — G8929 Other chronic pain: Secondary | ICD-10-CM | POA: Diagnosis not present

## 2018-12-23 DIAGNOSIS — M19011 Primary osteoarthritis, right shoulder: Secondary | ICD-10-CM | POA: Diagnosis not present

## 2018-12-23 NOTE — Progress Notes (Signed)
Subjective: Patient is here for ultrasound-guided intra-articular Visco-3 glenohumeral injection.  Third injection, seems to be helping.  Objective:  Better ROM today.  Procedure: Ultrasound-guided right glenohumeral injection: After sterile prep with Betadine, injected 8 cc 1% lidocaine without epinephrine and  Visco-3 using a 22-gauge spinal needle, passing the needle through approach into the glenohumeral joint.  Injectate seen filling capsule.

## 2018-12-31 ENCOUNTER — Ambulatory Visit (INDEPENDENT_AMBULATORY_CARE_PROVIDER_SITE_OTHER): Payer: Medicare Other | Admitting: Podiatry

## 2018-12-31 ENCOUNTER — Encounter: Payer: Self-pay | Admitting: Podiatry

## 2018-12-31 ENCOUNTER — Other Ambulatory Visit: Payer: Self-pay

## 2018-12-31 DIAGNOSIS — M79676 Pain in unspecified toe(s): Secondary | ICD-10-CM | POA: Diagnosis not present

## 2018-12-31 DIAGNOSIS — B351 Tinea unguium: Secondary | ICD-10-CM | POA: Diagnosis not present

## 2018-12-31 DIAGNOSIS — D689 Coagulation defect, unspecified: Secondary | ICD-10-CM

## 2018-12-31 DIAGNOSIS — E1140 Type 2 diabetes mellitus with diabetic neuropathy, unspecified: Secondary | ICD-10-CM

## 2018-12-31 NOTE — Progress Notes (Signed)
Patient ID: [name], male   DOB: June 14, 1928, 85 y.o.   MRN: [id number] Complaint:  Visit Type: Patient returns to my office for continued preventative foot care services. Complaint: Patient states" my nails have grown long and thick and become painful to walk and wear shoes" Patient has been diagnosed with DM with neuropathy.[name] He presents for preventative foot care services. No changes to ROS.  He also has injured his second toenail left foot and his fifth toenail right foot.   Patient is taking eliquiss.  Podiatric Exam: Vascular: dorsalis pedis and posterior tibial pulses are palpable bilateral. Capillary return is immediate. Temperature gradient is WNL. Skin turgor WNL  Sensorium: Diminished  Semmes Weinstein monofilament test. Normal tactile sensation bilaterally. Nail Exam: Pt has thick disfigured discolored nails with subungual debris noted bilateral entire nail hallux through fifth toenails Ulcer Exam: There is no evidence of ulcer or pre-ulcerative changes or infection. Orthopedic Exam: Muscle tone and strength are WNL. No limitations in general ROM. No crepitus or effusions noted. Foot type and digits show no abnormalities. Bony prominences are unremarkable. Asymptomatic HAV B/L. Skin:  Porokeratosis sub 5th metatarsal bilateral.. No infection or ulcers.   Diagnosis:  Tinea unguium, Pain in right toe, pain in left toes ,   Treatment & Plan Procedures and Treatment: Consent by patient was obtained for treatment procedures. The patient understood the discussion of treatment and procedures well. All questions were answered thoroughly reviewed. Debridement of mycotic and hypertrophic toenails, 1 through 5 bilateral and clearing of subungual debris. No ulceration, no infection noted.   Return Visit-Office Procedure: Patient instructed to return to the office for a follow up visit 10 weeks  for continued evaluation and treatment.   Gardiner Barefoot DPM

## 2019-01-01 DIAGNOSIS — L905 Scar conditions and fibrosis of skin: Secondary | ICD-10-CM | POA: Diagnosis not present

## 2019-01-01 DIAGNOSIS — D485 Neoplasm of uncertain behavior of skin: Secondary | ICD-10-CM | POA: Diagnosis not present

## 2019-01-01 DIAGNOSIS — L814 Other melanin hyperpigmentation: Secondary | ICD-10-CM | POA: Diagnosis not present

## 2019-01-01 DIAGNOSIS — L570 Actinic keratosis: Secondary | ICD-10-CM | POA: Diagnosis not present

## 2019-01-01 DIAGNOSIS — Z85828 Personal history of other malignant neoplasm of skin: Secondary | ICD-10-CM | POA: Diagnosis not present

## 2019-01-03 ENCOUNTER — Other Ambulatory Visit: Payer: Self-pay

## 2019-01-03 ENCOUNTER — Ambulatory Visit (INDEPENDENT_AMBULATORY_CARE_PROVIDER_SITE_OTHER): Payer: Medicare Other

## 2019-01-03 DIAGNOSIS — Z23 Encounter for immunization: Secondary | ICD-10-CM | POA: Diagnosis not present
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
## 2019-01-16 ENCOUNTER — Other Ambulatory Visit: Payer: Self-pay

## 2019-01-16 ENCOUNTER — Ambulatory Visit (INDEPENDENT_AMBULATORY_CARE_PROVIDER_SITE_OTHER): Payer: Medicare Other | Admitting: Internal Medicine

## 2019-01-16 ENCOUNTER — Encounter: Payer: Self-pay | Admitting: Internal Medicine

## 2019-01-16 VITALS — BP 132/66 | HR 76 | Temp 97.6°F | Ht 74.0 in | Wt 189.6 lb

## 2019-01-16 DIAGNOSIS — N184 Chronic kidney disease, stage 4 (severe): Secondary | ICD-10-CM

## 2019-01-16 DIAGNOSIS — I5022 Chronic systolic (congestive) heart failure: Secondary | ICD-10-CM

## 2019-01-16 DIAGNOSIS — E1140 Type 2 diabetes mellitus with diabetic neuropathy, unspecified: Secondary | ICD-10-CM | POA: Diagnosis not present

## 2019-01-16 DIAGNOSIS — D649 Anemia, unspecified: Secondary | ICD-10-CM

## 2019-01-16 DIAGNOSIS — Z79899 Other long term (current) drug therapy: Secondary | ICD-10-CM | POA: Diagnosis not present

## 2019-01-16 DIAGNOSIS — I255 Ischemic cardiomyopathy: Secondary | ICD-10-CM | POA: Diagnosis not present

## 2019-01-16 DIAGNOSIS — Z794 Long term (current) use of insulin: Secondary | ICD-10-CM

## 2019-01-16 LAB — HEMOGLOBIN A1C: Hgb A1c MFr Bld: 7.5 % — ABNORMAL HIGH (ref 4.6–6.5)

## 2019-01-16 LAB — BASIC METABOLIC PANEL
BUN: 32 mg/dL — ABNORMAL HIGH (ref 6–23)
CO2: 28 mEq/L (ref 19–32)
Calcium: 9 mg/dL (ref 8.4–10.5)
Chloride: 105 mEq/L (ref 96–112)
Creatinine, Ser: 1.91 mg/dL — ABNORMAL HIGH (ref 0.40–1.50)
GFR: 33.37 mL/min — ABNORMAL LOW (ref >60.00)
Glucose, Bld: 237 mg/dL — ABNORMAL HIGH (ref 70–99)
Potassium: 3.8 mEq/L (ref 3.5–5.1)
Sodium: 143 mEq/L (ref 135–145)

## 2019-01-16 LAB — VITAMIN B12: Vitamin B-12: 286 pg/mL (ref 211–911)

## 2019-01-16 NOTE — Patient Instructions (Addendum)
Lab today and will get copy of results to you. You sugar seem to be fine.  No change in meds at this time.   Plan ROV in 4-6 months or as needed.   get your eye exam when you can .

## 2019-01-20 ENCOUNTER — Other Ambulatory Visit (HOSPITAL_COMMUNITY): Payer: Self-pay

## 2019-01-20 MED ORDER — APIXABAN 2.5 MG PO TABS: 2.5000 mg | ORAL_TABLET | Freq: Two times a day (BID) | ORAL | 3 refills | Status: AC

## 2019-01-26 ENCOUNTER — Other Ambulatory Visit (HOSPITAL_COMMUNITY): Payer: Self-pay

## 2019-01-26 MED ORDER — TORSEMIDE 20 MG PO TABS: 40.0000 mg | ORAL_TABLET | Freq: Every day | ORAL | 1 refills | Status: DC

## 2019-01-31 ENCOUNTER — Other Ambulatory Visit (HOSPITAL_COMMUNITY): Payer: Self-pay | Admitting: Cardiology

## 2019-02-02 ENCOUNTER — Ambulatory Visit (INDEPENDENT_AMBULATORY_CARE_PROVIDER_SITE_OTHER): Payer: Medicare Other | Admitting: *Deleted

## 2019-02-02 ENCOUNTER — Ambulatory Visit (INDEPENDENT_AMBULATORY_CARE_PROVIDER_SITE_OTHER): Payer: Medicare Other | Admitting: Internal Medicine

## 2019-02-02 ENCOUNTER — Other Ambulatory Visit: Payer: Self-pay

## 2019-02-02 ENCOUNTER — Encounter (INDEPENDENT_AMBULATORY_CARE_PROVIDER_SITE_OTHER): Payer: Self-pay

## 2019-02-02 ENCOUNTER — Encounter: Payer: Self-pay | Admitting: Internal Medicine

## 2019-02-02 VITALS — BP 120/64 | HR 66 | Ht 74.0 in | Wt 189.4 lb

## 2019-02-02 DIAGNOSIS — I5023 Acute on chronic systolic (congestive) heart failure: Secondary | ICD-10-CM | POA: Diagnosis not present

## 2019-02-02 DIAGNOSIS — Z95810 Presence of automatic (implantable) cardiac defibrillator: Secondary | ICD-10-CM

## 2019-02-02 DIAGNOSIS — I255 Ischemic cardiomyopathy: Secondary | ICD-10-CM

## 2019-02-02 DIAGNOSIS — I509 Heart failure, unspecified: Secondary | ICD-10-CM

## 2019-02-02 DIAGNOSIS — I480 Paroxysmal atrial fibrillation: Secondary | ICD-10-CM

## 2019-02-02 NOTE — Patient Instructions (Signed)
Medication Instructions:  The current medical regimen is effective;  continue present plan and medications.  *If you need a refill on your cardiac medications before your next appointment, please call your pharmacy*  Follow-Up: At [facility], you and your health needs are our priority.  As part of our continuing mission to provide you with exceptional heart care, we have created designated Provider Care Teams.  These Care Teams include your primary Cardiologist (physician) and Advanced Practice Providers (APPs -  Physician Assistants and Nurse Practitioners) who all work together to provide you with the care you need, when you need it.  Your next appointment:   12 months  The format for your next appointment:   In Person  Provider:   Virl Axe, MD
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
## 2019-02-03 LAB — CUP PACEART REMOTE DEVICE CHECK
Battery Remaining Longevity: 64 mo
Battery Remaining Percentage: 95.5 %
Battery Voltage: 2.98 V
Brady Statistic AP VP Percent: 95 %
Brady Statistic AP VS Percent: 1 %
Brady Statistic AS VP Percent: 5.2 %
Brady Statistic AS VS Percent: 0 %
Brady Statistic RA Percent Paced: 95 %
Date Time Interrogation Session: 20201116215159
Implantable Lead Implant Date: 20051221
Implantable Lead Implant Date: 20150304
Implantable Lead Implant Date: 20150304
Implantable Lead Location: 753858
Implantable Lead Location: 753859
Implantable Lead Location: 753860
Implantable Lead Model: 1581
Implantable Pulse Generator Implant Date: 20200810
Lead Channel Impedance Value: 380 Ohm
Lead Channel Impedance Value: 390 Ohm
Lead Channel Impedance Value: 440 Ohm
Lead Channel Pacing Threshold Amplitude: 0.75 V
Lead Channel Pacing Threshold Amplitude: 1.25 V
Lead Channel Pacing Threshold Amplitude: 2.25 V
Lead Channel Pacing Threshold Pulse Width: 0.5 ms
Lead Channel Pacing Threshold Pulse Width: 0.7 ms
Lead Channel Pacing Threshold Pulse Width: 1 ms
Lead Channel Sensing Intrinsic Amplitude: 12 mV
Lead Channel Sensing Intrinsic Amplitude: 2.6 mV
Lead Channel Setting Pacing Amplitude: 1.5 V
Lead Channel Setting Pacing Amplitude: 2.5 V
Lead Channel Setting Pacing Amplitude: 2.5 V
Lead Channel Setting Pacing Pulse Width: 0.5 ms
Lead Channel Setting Pacing Pulse Width: 1.2 ms
Lead Channel Setting Sensing Sensitivity: 2 mV
Pulse Gen Serial Number: 9129173

## 2019-02-03 NOTE — Addendum Note (Signed)
Addended by: Gwyndolyn Saxon R on: 02/03/2019 12:10 PM   Modules accepted: Orders

## 2019-02-26 NOTE — Addendum Note (Signed)
Addended by: Douglass Rivers D on: 02/26/2019 09:52 AM   Modules accepted: Level of Service

## 2019-02-26 NOTE — Progress Notes (Signed)
Remote pacemaker transmission.   

## 2019-03-11 ENCOUNTER — Encounter: Payer: Self-pay | Admitting: Podiatry

## 2019-03-11 ENCOUNTER — Ambulatory Visit (INDEPENDENT_AMBULATORY_CARE_PROVIDER_SITE_OTHER): Payer: Medicare Other | Admitting: Podiatry

## 2019-03-11 ENCOUNTER — Other Ambulatory Visit: Payer: Self-pay

## 2019-03-11 DIAGNOSIS — B351 Tinea unguium: Secondary | ICD-10-CM | POA: Diagnosis not present

## 2019-03-11 DIAGNOSIS — E1140 Type 2 diabetes mellitus with diabetic neuropathy, unspecified: Secondary | ICD-10-CM

## 2019-03-11 DIAGNOSIS — M79676 Pain in unspecified toe(s): Secondary | ICD-10-CM

## 2019-03-11 DIAGNOSIS — D689 Coagulation defect, unspecified: Secondary | ICD-10-CM

## 2019-03-11 NOTE — Progress Notes (Signed)
Patient ID: [name], male   DOB: 1929/03/04, 85 y.o.   MRN: [id number] Complaint:  Visit Type: Patient returns to my office for continued preventative foot care services. Complaint: Patient states" my nails have grown long and thick and become painful to walk and wear shoes" Patient has been diagnosed with DM with neuropathy.[name] He presents for preventative foot care services. No changes to ROS.  He also has injured his second toenail left foot and his fifth toenail right foot.   Patient is taking eliquiss.  Podiatric Exam: Vascular: dorsalis pedis and posterior tibial pulses are palpable bilateral. Capillary return is immediate. Temperature gradient is WNL. Skin turgor WNL  Sensorium: Diminished  Semmes Weinstein monofilament test. Normal tactile sensation bilaterally. Nail Exam: Pt has thick disfigured discolored nails with subungual debris noted bilateral entire nail hallux through fifth toenails Ulcer Exam: There is no evidence of ulcer or pre-ulcerative changes or infection. Orthopedic Exam: Muscle tone and strength are WNL. No limitations in general ROM. No crepitus or effusions noted. Foot type and digits show no abnormalities. Bony prominences are unremarkable. Asymptomatic HAV B/L. Skin:  Porokeratosis sub 5th metatarsal bilateral.. No infection or ulcers.   Diagnosis:  Tinea unguium, Pain in right toe, pain in left toes ,   Treatment & Plan Procedures and Treatment: Consent by patient was obtained for treatment procedures. The patient understood the discussion of treatment and procedures well. All questions were answered thoroughly reviewed. Debridement of mycotic and hypertrophic toenails, 1 through 5 bilateral and clearing of subungual debris. No ulceration, no infection noted.   Return Visit-Office Procedure: Patient instructed to return to the office for a follow up visit 10 weeks  for continued evaluation and treatment.   Gardiner Barefoot DPM

## 2019-03-12 LAB — CUP PACEART INCLINIC DEVICE CHECK
Battery Remaining Longevity: 60 mo
Battery Voltage: 2.98 V
Brady Statistic RA Percent Paced: 97 %
Brady Statistic RV Percent Paced: 99.73 %
Date Time Interrogation Session: 20201116153500
Implantable Lead Implant Date: 20051221
Implantable Lead Implant Date: 20150304
Implantable Lead Implant Date: 20150304
Implantable Lead Location: 753858
Implantable Lead Location: 753859
Implantable Lead Location: 753860
Implantable Lead Model: 1581
Implantable Pulse Generator Implant Date: 20200810
Lead Channel Impedance Value: 375 Ohm
Lead Channel Impedance Value: 387.5 Ohm
Lead Channel Impedance Value: 437.5 Ohm
Lead Channel Pacing Threshold Amplitude: 0.75 V
Lead Channel Pacing Threshold Amplitude: 1.25 V
Lead Channel Pacing Threshold Amplitude: 2.25 V
Lead Channel Pacing Threshold Pulse Width: 0.5 ms
Lead Channel Pacing Threshold Pulse Width: 0.7 ms
Lead Channel Pacing Threshold Pulse Width: 1 ms
Lead Channel Sensing Intrinsic Amplitude: 12 mV
Lead Channel Sensing Intrinsic Amplitude: 2.6 mV
Lead Channel Setting Pacing Amplitude: 1.5 V
Lead Channel Setting Pacing Amplitude: 2.5 V
Lead Channel Setting Pacing Amplitude: 2.5 V
Lead Channel Setting Pacing Pulse Width: 0.5 ms
Lead Channel Setting Pacing Pulse Width: 1.2 ms
Lead Channel Setting Sensing Sensitivity: 2 mV
Pulse Gen Serial Number: 9129173

## 2019-03-24 DIAGNOSIS — L218 Other seborrheic dermatitis: Secondary | ICD-10-CM | POA: Diagnosis not present

## 2019-03-24 DIAGNOSIS — Z85828 Personal history of other malignant neoplasm of skin: Secondary | ICD-10-CM | POA: Diagnosis not present

## 2019-03-24 DIAGNOSIS — L905 Scar conditions and fibrosis of skin: Secondary | ICD-10-CM | POA: Diagnosis not present

## 2019-04-03 ENCOUNTER — Encounter: Payer: Self-pay | Admitting: Family Medicine

## 2019-04-03 ENCOUNTER — Other Ambulatory Visit: Payer: Self-pay

## 2019-04-03 ENCOUNTER — Ambulatory Visit (INDEPENDENT_AMBULATORY_CARE_PROVIDER_SITE_OTHER): Payer: Medicare Other | Admitting: Family Medicine

## 2019-04-03 VITALS — BP 110/58 | HR 66 | Temp 97.5°F | Ht 74.0 in | Wt 193.3 lb

## 2019-04-03 DIAGNOSIS — S8012XA Contusion of left lower leg, initial encounter: Secondary | ICD-10-CM

## 2019-04-03 NOTE — Patient Instructions (Signed)
Follow up for any increased redness, warmth, or other concerns.
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
## 2019-04-07 ENCOUNTER — Ambulatory Visit (INDEPENDENT_AMBULATORY_CARE_PROVIDER_SITE_OTHER): Payer: Medicare Other | Admitting: Family Medicine

## 2019-04-07 ENCOUNTER — Encounter: Payer: Self-pay | Admitting: Family Medicine

## 2019-04-07 ENCOUNTER — Other Ambulatory Visit: Payer: Self-pay

## 2019-04-07 ENCOUNTER — Ambulatory Visit: Payer: Self-pay

## 2019-04-07 DIAGNOSIS — M25511 Pain in right shoulder: Secondary | ICD-10-CM

## 2019-04-07 DIAGNOSIS — G8929 Other chronic pain: Secondary | ICD-10-CM
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
## 2019-04-17 ENCOUNTER — Other Ambulatory Visit: Payer: Self-pay | Admitting: Internal Medicine

## 2019-05-05 ENCOUNTER — Ambulatory Visit (INDEPENDENT_AMBULATORY_CARE_PROVIDER_SITE_OTHER): Payer: Medicare Other | Admitting: *Deleted

## 2019-05-05 DIAGNOSIS — I480 Paroxysmal atrial fibrillation: Secondary | ICD-10-CM | POA: Diagnosis not present

## 2019-05-05 LAB — CUP PACEART REMOTE DEVICE CHECK
Battery Remaining Longevity: 68 mo
Battery Remaining Percentage: 95.5 %
Battery Voltage: 2.99 V
Brady Statistic AP VP Percent: 97 %
Brady Statistic AP VS Percent: 1 %
Brady Statistic AS VP Percent: 2.5 %
Brady Statistic AS VS Percent: 1 %
Brady Statistic RA Percent Paced: 97 %
Date Time Interrogation Session: 20210215040017
Implantable Lead Implant Date: 20051221
Implantable Lead Implant Date: 20150304
Implantable Lead Implant Date: 20150304
Implantable Lead Location: 753858
Implantable Lead Location: 753859
Implantable Lead Location: 753860
Implantable Lead Model: 1581
Implantable Pulse Generator Implant Date: 20200810
Lead Channel Impedance Value: 380 Ohm
Lead Channel Impedance Value: 430 Ohm
Lead Channel Impedance Value: 440 Ohm
Lead Channel Pacing Threshold Amplitude: 0.75 V
Lead Channel Pacing Threshold Amplitude: 1.25 V
Lead Channel Pacing Threshold Amplitude: 2.25 V
Lead Channel Pacing Threshold Pulse Width: 0.5 ms
Lead Channel Pacing Threshold Pulse Width: 0.7 ms
Lead Channel Pacing Threshold Pulse Width: 1 ms
Lead Channel Sensing Intrinsic Amplitude: 12 mV
Lead Channel Sensing Intrinsic Amplitude: 2 mV
Lead Channel Setting Pacing Amplitude: 1.5 V
Lead Channel Setting Pacing Amplitude: 2.5 V
Lead Channel Setting Pacing Amplitude: 2.5 V
Lead Channel Setting Pacing Pulse Width: 0.5 ms
Lead Channel Setting Pacing Pulse Width: 1.2 ms
Lead Channel Setting Sensing Sensitivity: 2 mV
Pulse Gen Model: 3562
Pulse Gen Serial Number: 9129173

## 2019-05-06 NOTE — Progress Notes (Signed)
PPM Remote  

## 2019-05-20 ENCOUNTER — Ambulatory Visit (INDEPENDENT_AMBULATORY_CARE_PROVIDER_SITE_OTHER): Payer: Medicare Other | Admitting: Podiatry

## 2019-05-20 ENCOUNTER — Encounter: Payer: Self-pay | Admitting: Podiatry

## 2019-05-20 ENCOUNTER — Other Ambulatory Visit: Payer: Self-pay

## 2019-05-20 DIAGNOSIS — M79676 Pain in unspecified toe(s): Secondary | ICD-10-CM | POA: Diagnosis not present

## 2019-05-20 DIAGNOSIS — B351 Tinea unguium: Secondary | ICD-10-CM

## 2019-05-20 DIAGNOSIS — L84 Corns and callosities: Secondary | ICD-10-CM | POA: Diagnosis not present

## 2019-05-20 DIAGNOSIS — E1140 Type 2 diabetes mellitus with diabetic neuropathy, unspecified: Secondary | ICD-10-CM

## 2019-05-20 NOTE — Progress Notes (Signed)
Patient ID: [name], male   DOB: 17-Sep-1928, 86 y.o.   MRN: [id number] Complaint:  Visit Type: Patient returns to my office for continued preventative foot care services. Complaint: Patient states" my nails have grown long and thick and become painful to walk and wear shoes" Patient has been diagnosed with DM with neuropathy.[name] He presents for preventative foot care services.    Patient is taking eliquiss.  Patient has been diagnosed with PAD.  Podiatric Exam: Vascular: dorsalis pedis and posterior tibial pulses are palpable bilateral. Capillary return is immediate. Temperature gradient is WNL. Skin turgor WNL  Sensorium: Diminished  Semmes Weinstein monofilament test. Normal tactile sensation bilaterally. Nail Exam: Pt has thick disfigured discolored nails with subungual debris noted bilateral entire nail hallux through fifth toenails Ulcer Exam: There is no evidence of ulcer or pre-ulcerative changes or infection. Orthopedic Exam: Muscle tone and strength are WNL. No limitations in general ROM. No crepitus or effusions noted. Foot type and digits show no abnormalities. Bony prominences are unremarkable. Asymptomatic HAV B/L. Skin:  Porokeratosis sub  1st, and  5th metatarsal bilateral.. No infection or ulcers.   Diagnosis:  Tinea unguium, Pain in right toe, pain in left toes , Porokeratosis sub 1  B/L.  Treatment & Plan Procedures and Treatment: Consent by patient was obtained for treatment procedures. The patient understood the discussion of treatment and procedures well. All questions were answered thoroughly reviewed. Debridement of mycotic and hypertrophic toenails, 1 through 5 bilateral and clearing of subungual debris. No ulceration, no infection noted.   Return Visit-Office Procedure: Patient instructed to return to the office for a follow up visit 10 weeks  for continued evaluation and treatment.   Gardiner Barefoot DPM

## 2019-05-22 ENCOUNTER — Telehealth (HOSPITAL_COMMUNITY): Payer: Self-pay | Admitting: Cardiology

## 2019-05-22 MED ORDER — ISOSORBIDE MONONITRATE ER 60 MG PO TB24: 60.0000 mg | ORAL_TABLET | Freq: Every day | ORAL | 0 refills | Status: DC

## 2019-05-22 NOTE — Telephone Encounter (Signed)
Patients wife called to request a two week supply of imdur to local pharmacy until mail order comes in

## 2019-06-10 ENCOUNTER — Other Ambulatory Visit (HOSPITAL_COMMUNITY): Payer: Self-pay

## 2019-06-10 MED ORDER — ISOSORBIDE MONONITRATE ER 60 MG PO TB24: 60.0000 mg | ORAL_TABLET | Freq: Every day | ORAL | 0 refills | Status: DC

## 2019-06-22 DIAGNOSIS — L814 Other melanin hyperpigmentation: Secondary | ICD-10-CM | POA: Diagnosis not present

## 2019-06-22 DIAGNOSIS — Z85828 Personal history of other malignant neoplasm of skin: Secondary | ICD-10-CM | POA: Diagnosis not present

## 2019-06-22 DIAGNOSIS — L905 Scar conditions and fibrosis of skin: Secondary | ICD-10-CM | POA: Diagnosis not present

## 2019-06-22 DIAGNOSIS — L570 Actinic keratosis: Secondary | ICD-10-CM | POA: Diagnosis not present

## 2019-07-07 ENCOUNTER — Ambulatory Visit (INDEPENDENT_AMBULATORY_CARE_PROVIDER_SITE_OTHER): Payer: Medicare Other | Admitting: Family Medicine

## 2019-07-07 ENCOUNTER — Other Ambulatory Visit: Payer: Self-pay

## 2019-07-07 ENCOUNTER — Ambulatory Visit: Payer: Self-pay

## 2019-07-07 ENCOUNTER — Encounter: Payer: Self-pay | Admitting: Family Medicine

## 2019-07-07 DIAGNOSIS — G8929 Other chronic pain: Secondary | ICD-10-CM | POA: Diagnosis not present

## 2019-07-07 DIAGNOSIS — M25511 Pain in right shoulder: Secondary | ICD-10-CM
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
## 2019-07-24 ENCOUNTER — Other Ambulatory Visit (HOSPITAL_COMMUNITY): Payer: Self-pay | Admitting: Cardiology

## 2019-08-03 LAB — CUP PACEART REMOTE DEVICE CHECK
Battery Remaining Longevity: 67 mo
Battery Remaining Percentage: 95.5 %
Battery Voltage: 2.99 V
Brady Statistic AP VP Percent: 97 %
Brady Statistic AP VS Percent: 1 %
Brady Statistic AS VP Percent: 2.5 %
Brady Statistic AS VS Percent: 1 %
Brady Statistic RA Percent Paced: 97 %
Date Time Interrogation Session: 20210517040010
Implantable Lead Implant Date: 20051221
Implantable Lead Implant Date: 20150304
Implantable Lead Implant Date: 20150304
Implantable Lead Location: 753858
Implantable Lead Location: 753859
Implantable Lead Location: 753860
Implantable Lead Model: 1581
Implantable Pulse Generator Implant Date: 20200810
Lead Channel Impedance Value: 380 Ohm
Lead Channel Impedance Value: 380 Ohm
Lead Channel Impedance Value: 440 Ohm
Lead Channel Pacing Threshold Amplitude: 0.75 V
Lead Channel Pacing Threshold Amplitude: 1.25 V
Lead Channel Pacing Threshold Amplitude: 2.25 V
Lead Channel Pacing Threshold Pulse Width: 0.5 ms
Lead Channel Pacing Threshold Pulse Width: 0.7 ms
Lead Channel Pacing Threshold Pulse Width: 1 ms
Lead Channel Sensing Intrinsic Amplitude: 12 mV
Lead Channel Sensing Intrinsic Amplitude: 2 mV
Lead Channel Setting Pacing Amplitude: 1.5 V
Lead Channel Setting Pacing Amplitude: 2.5 V
Lead Channel Setting Pacing Amplitude: 2.5 V
Lead Channel Setting Pacing Pulse Width: 0.5 ms
Lead Channel Setting Pacing Pulse Width: 1.2 ms
Lead Channel Setting Sensing Sensitivity: 2 mV
Pulse Gen Model: 3562
Pulse Gen Serial Number: 9129173

## 2019-08-04 ENCOUNTER — Ambulatory Visit (INDEPENDENT_AMBULATORY_CARE_PROVIDER_SITE_OTHER): Payer: Medicare Other | Admitting: *Deleted

## 2019-08-04 DIAGNOSIS — I480 Paroxysmal atrial fibrillation: Secondary | ICD-10-CM

## 2019-08-04 DIAGNOSIS — I255 Ischemic cardiomyopathy: Secondary | ICD-10-CM

## 2019-08-05 ENCOUNTER — Encounter: Payer: Self-pay | Admitting: Podiatry

## 2019-08-05 ENCOUNTER — Ambulatory Visit (INDEPENDENT_AMBULATORY_CARE_PROVIDER_SITE_OTHER): Payer: Medicare Other | Admitting: Podiatry

## 2019-08-05 ENCOUNTER — Other Ambulatory Visit: Payer: Self-pay

## 2019-08-05 VITALS — Temp 98.1°F

## 2019-08-05 DIAGNOSIS — E1140 Type 2 diabetes mellitus with diabetic neuropathy, unspecified: Secondary | ICD-10-CM

## 2019-08-05 DIAGNOSIS — D689 Coagulation defect, unspecified: Secondary | ICD-10-CM | POA: Diagnosis not present

## 2019-08-05 DIAGNOSIS — M79676 Pain in unspecified toe(s): Secondary | ICD-10-CM | POA: Diagnosis not present

## 2019-08-05 DIAGNOSIS — B351 Tinea unguium: Secondary | ICD-10-CM

## 2019-08-05 NOTE — Progress Notes (Signed)
Remote pacemaker transmission.   

## 2019-08-05 NOTE — Progress Notes (Signed)
This patient returns to my office for at risk foot care.  This patient requires this care by a professional since this patient will be at risk due to having PAD and diabetes and chronic kidney disease.  This patient is unable to cut nails himself since the patient cannot reach his nails.These nails are painful walking and wearing shoes.  This patient presents for at risk foot care today.  General Appearance  Alert, conversant and in no acute stress.  Vascular  Dorsalis pedis and posterior tibial  pulses are palpable  bilaterally.  Capillary return is within normal limits  bilaterally. Temperature is within normal limits  bilaterally.  Neurologic  Senn-Weinstein monofilament wire test diminished   bilaterally. Muscle power within normal limits bilaterally.  Nails Thick disfigured discolored nails with subungual debris  from hallux to fifth toes bilaterally. No evidence of bacterial infection or drainage bilaterally.  Orthopedic  No limitations of motion  feet .  No crepitus or effusions noted.  No bony pathology or digital deformities noted. Asymptomatic  HAV  B/L.    Skin  normotropic skin with no porokeratosis noted bilaterally.  No signs of infections or ulcers noted.     Onychomycosis  Pain in right toes  Pain in left toes  Asymptomatic plantar tyloma.  Consent was obtained for treatment procedures.   Mechanical debridement of nails 1-5  bilaterally performed with a nail nipper.  Filed with dremel without incident.    Return office visit     10 weeks                Told patient to return for periodic foot care and evaluation due to potential at risk complications.   Gardiner Barefoot DPM

## 2019-08-18 ENCOUNTER — Other Ambulatory Visit (HOSPITAL_COMMUNITY): Payer: Self-pay | Admitting: Cardiology

## 2019-08-18 ENCOUNTER — Other Ambulatory Visit: Payer: Self-pay

## 2019-08-18 ENCOUNTER — Ambulatory Visit (HOSPITAL_COMMUNITY)
Admission: RE | Admit: 2019-08-18 | Discharge: 2019-08-18 | Disposition: A | Discharge status: Home / Self Care | Payer: Medicare Other | Source: Ambulatory Visit | Attending: Cardiology | Admitting: Cardiology

## 2019-08-18 DIAGNOSIS — I6523 Occlusion and stenosis of bilateral carotid arteries: Secondary | ICD-10-CM

## 2019-08-31 ENCOUNTER — Other Ambulatory Visit (HOSPITAL_COMMUNITY): Payer: Self-pay | Admitting: Cardiology

## 2019-10-04 ENCOUNTER — Other Ambulatory Visit: Payer: Self-pay | Admitting: Internal Medicine

## 2019-10-10 ENCOUNTER — Other Ambulatory Visit: Payer: Self-pay | Admitting: Internal Medicine

## 2019-10-16 ENCOUNTER — Other Ambulatory Visit (HOSPITAL_COMMUNITY): Payer: Self-pay | Admitting: Cardiology

## 2019-10-19 ENCOUNTER — Telehealth: Payer: Self-pay | Admitting: *Deleted

## 2019-10-19 NOTE — Telephone Encounter (Signed)
Pt states he needs to cancel his 10/20/2019 appt and reschedule later.

## 2019-10-21 ENCOUNTER — Ambulatory Visit: Payer: Medicare Other | Admitting: Podiatry

## 2019-10-22 ENCOUNTER — Other Ambulatory Visit (HOSPITAL_COMMUNITY): Payer: Self-pay | Admitting: Cardiology

## 2019-10-22 DIAGNOSIS — I2510 Atherosclerotic heart disease of native coronary artery without angina pectoris: Secondary | ICD-10-CM

## 2019-11-03 ENCOUNTER — Other Ambulatory Visit: Payer: Self-pay

## 2019-11-03 ENCOUNTER — Ambulatory Visit (INDEPENDENT_AMBULATORY_CARE_PROVIDER_SITE_OTHER): Payer: Medicare Other

## 2019-11-03 ENCOUNTER — Ambulatory Visit (INDEPENDENT_AMBULATORY_CARE_PROVIDER_SITE_OTHER): Payer: Medicare Other | Admitting: *Deleted

## 2019-11-03 DIAGNOSIS — I255 Ischemic cardiomyopathy: Secondary | ICD-10-CM | POA: Diagnosis not present

## 2019-11-03 DIAGNOSIS — Z0000 Encounter for general adult medical examination without abnormal findings: Secondary | ICD-10-CM | POA: Diagnosis not present

## 2019-11-03 LAB — CUP PACEART REMOTE DEVICE CHECK
Battery Remaining Longevity: 68 mo
Battery Remaining Percentage: 95.5 %
Battery Voltage: 2.99 V
Brady Statistic AP VP Percent: 97 %
Brady Statistic AP VS Percent: 1 %
Brady Statistic AS VP Percent: 2.7 %
Brady Statistic AS VS Percent: 1 %
Brady Statistic RA Percent Paced: 97 %
Date Time Interrogation Session: 20210816042013
Implantable Lead Implant Date: 20051221
Implantable Lead Implant Date: 20150304
Implantable Lead Implant Date: 20150304
Implantable Lead Location: 753858
Implantable Lead Location: 753859
Implantable Lead Location: 753860
Implantable Lead Model: 1581
Implantable Pulse Generator Implant Date: 20200810
Lead Channel Impedance Value: 380 Ohm
Lead Channel Impedance Value: 410 Ohm
Lead Channel Impedance Value: 440 Ohm
Lead Channel Pacing Threshold Amplitude: 0.75 V
Lead Channel Pacing Threshold Amplitude: 1.25 V
Lead Channel Pacing Threshold Amplitude: 2.25 V
Lead Channel Pacing Threshold Pulse Width: 0.5 ms
Lead Channel Pacing Threshold Pulse Width: 0.7 ms
Lead Channel Pacing Threshold Pulse Width: 1 ms
Lead Channel Sensing Intrinsic Amplitude: 1.8 mV
Lead Channel Sensing Intrinsic Amplitude: 12 mV
Lead Channel Setting Pacing Amplitude: 1.5 V
Lead Channel Setting Pacing Amplitude: 2.5 V
Lead Channel Setting Pacing Amplitude: 2.5 V
Lead Channel Setting Pacing Pulse Width: 0.5 ms
Lead Channel Setting Pacing Pulse Width: 1.2 ms
Lead Channel Setting Sensing Sensitivity: 2 mV
Pulse Gen Model: 3562
Pulse Gen Serial Number: 9129173
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
## 2019-11-03 NOTE — Patient Instructions (Signed)
[name] , Thank you for taking time to come for your Medicare Wellness Visit. I appreciate your ongoing commitment to your health goals. Please review the following plan we discussed and let me know if I can assist you in the future.   Screening recommendations/referrals: Colonoscopy: No longer required Recommended yearly ophthalmology/optometry visit for glaucoma screening and checkup Recommended yearly dental visit for hygiene and checkup  Vaccinations: Influenza vaccine: Up to date Pneumococcal vaccine: Up to date Tdap vaccine: Up to date Shingles vaccine: Shingrix discussed. Please contact your pharmacy for coverage information.    Covid-19: Pt stated completed in January will bring in copy of card  Advanced directives: Please bring a copy of your health care power of attorney and living will to the office at your convenience.   Conditions/risks identified: Stay healthy  Next appointment: Follow up in one year for your annual wellness visit.   Preventive Care 49 Years and Older, Male Preventive care refers to lifestyle choices and visits with your health care provider that can promote health and wellness. What does preventive care include?  A yearly physical exam. This is also called an annual well check.  Dental exams once or twice a year.  Routine eye exams. Ask your health care provider how often you should have your eyes checked.  Personal lifestyle choices, including:  Daily care of your teeth and gums.  Regular physical activity.  Eating a healthy diet.  Avoiding tobacco and drug use.  Limiting alcohol use.  Practicing safe sex.  Taking low doses of aspirin every day.  Taking vitamin and mineral supplements as recommended by your health care provider. What happens during an annual well check? The services and screenings done by your health care provider during your annual well check will depend on your age, overall health, lifestyle risk factors, and  family history of disease. Counseling  Your health care provider may ask you questions about your:  Alcohol use.  Tobacco use.  Drug use.  Emotional well-being.  Home and relationship well-being.  Sexual activity.  Eating habits.  History of falls.  Memory and ability to understand (cognition).  Work and work Statistician. Screening  You may have the following tests or measurements:  Height, weight, and BMI.  Blood pressure.  Lipid and cholesterol levels. These may be checked every 5 years, or more frequently if you are over 54 years old.  Skin check.  Lung cancer screening. You may have this screening every year starting at age 62 if you have a 30-pack-year history of smoking and currently smoke or have quit within the past 15 years.  Fecal occult blood test (FOBT) of the stool. You may have this test every year starting at age 36.  Flexible sigmoidoscopy or colonoscopy. You may have a sigmoidoscopy every 5 years or a colonoscopy every 10 years starting at age 93.  Prostate cancer screening. Recommendations will vary depending on your family history and other risks.  Hepatitis C blood test.  Hepatitis B blood test.  Sexually transmitted disease (STD) testing.  Diabetes screening. This is done by checking your blood sugar (glucose) after you have not eaten for a while (fasting). You may have this done every 1-3 years.  Abdominal aortic aneurysm (AAA) screening. You may need this if you are a current or former smoker.  Osteoporosis. You may be screened starting at age 75 if you are at high risk. Talk with your health care provider about your test results, treatment options, and if necessary, the  need for more tests. Vaccines  Your health care provider may recommend certain vaccines, such as:  Influenza vaccine. This is recommended every year.  Tetanus, diphtheria, and acellular pertussis (Tdap, Td) vaccine. You may need a Td booster every 10 years.  Zoster  vaccine. You may need this after age 48.  Pneumococcal 13-valent conjugate (PCV13) vaccine. One dose is recommended after age 93.  Pneumococcal polysaccharide (PPSV23) vaccine. One dose is recommended after age 46. Talk to your health care provider about which screenings and vaccines you need and how often you need them. This information is not intended to replace advice given to you by your health care provider. Make sure you discuss any questions you have with your health care provider. Document Released: 04/01/2015 Document Revised: 11/23/2015 Document Reviewed: 01/04/2015 Elsevier Interactive Patient Education  2017 [employer] Prevention in the Home Falls can cause injuries. They can happen to people of all ages. There are many things you can do to make your home safe and to help prevent falls. What can I do on the outside of my home?  Regularly fix the edges of walkways and driveways and fix any cracks.  Remove anything that might make you trip as you walk through a door, such as a raised step or threshold.  Trim any bushes or trees on the path to your home.  Use bright outdoor lighting.  Clear any walking paths of anything that might make someone trip, such as rocks or tools.  Regularly check to see if handrails are loose or broken. Make sure that both sides of any steps have handrails.  Any raised decks and porches should have guardrails on the edges.  Have any leaves, snow, or ice cleared regularly.  Use sand or salt on walking paths during winter.  Clean up any spills in your garage right away. This includes oil or grease spills. What can I do in the bathroom?  Use night lights.  Install grab bars by the toilet and in the tub and shower. Do not use towel bars as grab bars.  Use non-skid mats or decals in the tub or shower.  If you need to sit down in the shower, use a plastic, non-slip stool.  Keep the floor dry. Clean up any water that spills on the  floor as soon as it happens.  Remove soap buildup in the tub or shower regularly.  Attach bath mats securely with double-sided non-slip rug tape.  Do not have throw rugs and other things on the floor that can make you trip. What can I do in the bedroom?  Use night lights.  Make sure that you have a light by your bed that is easy to reach.  Do not use any sheets or blankets that are too big for your bed. They should not hang down onto the floor.  Have a firm chair that has side arms. You can use this for support while you get dressed.  Do not have throw rugs and other things on the floor that can make you trip. What can I do in the kitchen?  Clean up any spills right away.  Avoid walking on wet floors.  Keep items that you use a lot in easy-to-reach places.  If you need to reach something above you, use a strong step stool that has a grab bar.  Keep electrical cords out of the way.  Do not use floor polish or wax that makes floors slippery. If you must use wax,  use non-skid floor wax.  Do not have throw rugs and other things on the floor that can make you trip. What can I do with my stairs?  Do not leave any items on the stairs.  Make sure that there are handrails on both sides of the stairs and use them. Fix handrails that are broken or loose. Make sure that handrails are as long as the stairways.  Check any carpeting to make sure that it is firmly attached to the stairs. Fix any carpet that is loose or worn.  Avoid having throw rugs at the top or bottom of the stairs. If you do have throw rugs, attach them to the floor with carpet tape.  Make sure that you have a light switch at the top of the stairs and the bottom of the stairs. If you do not have them, ask someone to add them for you. What else can I do to help prevent falls?  Wear shoes that:  Do not have high heels.  Have rubber bottoms.  Are comfortable and fit you well.  Are closed at the toe. Do not wear  sandals.  If you use a stepladder:  Make sure that it is fully opened. Do not climb a closed stepladder.  Make sure that both sides of the stepladder are locked into place.  Ask someone to hold it for you, if possible.  Clearly mark and make sure that you can see:  Any grab bars or handrails.  First and last steps.  Where the edge of each step is.  Use tools that help you move around (mobility aids) if they are needed. These include:  Canes.  Walkers.  Scooters.  Crutches.  Turn on the lights when you go into a dark area. Replace any light bulbs as soon as they burn out.  Set up your furniture so you have a clear path. Avoid moving your furniture around.  If any of your floors are uneven, fix them.  If there are any pets around you, be aware of where they are.  Review your medicines with your doctor. Some medicines can make you feel dizzy. This can increase your chance of falling. Ask your doctor what other things that you can do to help prevent falls. This information is not intended to replace advice given to you by your health care provider. Make sure you discuss any questions you have with your health care provider. Document Released: 12/30/2008 Document Revised: 08/11/2015 Document Reviewed: 04/09/2014 Elsevier Interactive Patient Education  2017 [employer].

## 2019-11-05 NOTE — Progress Notes (Signed)
Remote pacemaker transmission.   

## 2019-12-04 ENCOUNTER — Telehealth (HOSPITAL_COMMUNITY): Payer: Self-pay | Admitting: *Deleted

## 2019-12-04 NOTE — Telephone Encounter (Signed)
pts wife called stating pt has not been seen in office in over a year and he recently started having problems with swelling in his feet. Called her back to schedule an appointment and get more information no answer.

## 2019-12-22 DIAGNOSIS — D229 Melanocytic nevi, unspecified: Secondary | ICD-10-CM | POA: Diagnosis not present

## 2019-12-22 DIAGNOSIS — L570 Actinic keratosis: Secondary | ICD-10-CM | POA: Diagnosis not present

## 2019-12-22 DIAGNOSIS — C44329 Squamous cell carcinoma of skin of other parts of face: Secondary | ICD-10-CM | POA: Diagnosis not present

## 2019-12-22 DIAGNOSIS — C44629 Squamous cell carcinoma of skin of left upper limb, including shoulder: Secondary | ICD-10-CM | POA: Diagnosis not present

## 2019-12-22 DIAGNOSIS — L821 Other seborrheic keratosis: Secondary | ICD-10-CM | POA: Diagnosis not present

## 2019-12-22 DIAGNOSIS — D1801 Hemangioma of skin and subcutaneous tissue: Secondary | ICD-10-CM | POA: Diagnosis not present

## 2019-12-22 DIAGNOSIS — D485 Neoplasm of uncertain behavior of skin: Secondary | ICD-10-CM | POA: Diagnosis not present

## 2019-12-22 DIAGNOSIS — L905 Scar conditions and fibrosis of skin: Secondary | ICD-10-CM | POA: Diagnosis not present

## 2019-12-22 DIAGNOSIS — Z85828 Personal history of other malignant neoplasm of skin: Secondary | ICD-10-CM | POA: Diagnosis not present

## 2019-12-23 ENCOUNTER — Ambulatory Visit (INDEPENDENT_AMBULATORY_CARE_PROVIDER_SITE_OTHER): Payer: Medicare Other

## 2019-12-23 DIAGNOSIS — Z23 Encounter for immunization: Secondary | ICD-10-CM | POA: Diagnosis not present

## 2019-12-23 NOTE — Progress Notes (Signed)
Patient received high dose flu vaccine in Right Deltoid.  Patient tolerated vaccine well.

## 2020-01-01 ENCOUNTER — Other Ambulatory Visit: Payer: Self-pay | Admitting: Internal Medicine

## 2020-01-19 ENCOUNTER — Other Ambulatory Visit (HOSPITAL_COMMUNITY): Payer: Self-pay | Admitting: Cardiology

## 2020-01-20 ENCOUNTER — Ambulatory Visit (HOSPITAL_COMMUNITY)
Admission: EM | Admit: 2020-01-20 | Discharge: 2020-01-20 | Disposition: A | Discharge status: Home / Self Care | Payer: Medicare Other | Attending: Family Medicine | Admitting: Family Medicine

## 2020-01-20 ENCOUNTER — Other Ambulatory Visit: Payer: Self-pay

## 2020-01-20 ENCOUNTER — Encounter (HOSPITAL_COMMUNITY): Payer: Self-pay

## 2020-01-20 ENCOUNTER — Ambulatory Visit (HOSPITAL_COMMUNITY)
Admission: RE | Admit: 2020-01-20 | Discharge: 2020-01-20 | Disposition: A | Discharge status: Home / Self Care | Payer: Medicare Other | Source: Ambulatory Visit | Attending: Family Medicine | Admitting: Family Medicine

## 2020-01-20 DIAGNOSIS — M7989 Other specified soft tissue disorders: Secondary | ICD-10-CM

## 2020-01-20 DIAGNOSIS — M79661 Pain in right lower leg: Secondary | ICD-10-CM | POA: Insufficient documentation

## 2020-01-20 NOTE — Discharge Instructions (Addendum)
Sending you across the street for an ultrasound of the leg.

## 2020-01-20 NOTE — ED Triage Notes (Signed)
Pt presents with complaints of right foot and lower leg pain that started today. Patient has had swelling in his lower extremities, worse on the right, x 2 weeks. 2 plus pitting edema to left lower extremity. Pedal pulse 4 plus in left foot, 2 plus in right foot.

## 2020-01-20 NOTE — Progress Notes (Signed)
Lower extremity venous RT study completed.  Message left for Rozanna Box, NP.   See CV Proc for preliminary results report.   Darlin Coco, RDMS
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
## 2020-01-21 ENCOUNTER — Telehealth (HOSPITAL_COMMUNITY): Payer: Self-pay | Admitting: *Deleted

## 2020-01-21 ENCOUNTER — Telehealth (HOSPITAL_COMMUNITY): Payer: Self-pay | Admitting: Family Medicine

## 2020-01-21 MED ORDER — TRAMADOL HCL 50 MG PO TABS: 50.0000 mg | ORAL_TABLET | Freq: Four times a day (QID) | ORAL | 0 refills | Status: DC | PRN

## 2020-01-21 NOTE — Telephone Encounter (Signed)
Pts caregiver called stating pt went to urgent care yesterday for leg pain and was sent to the ED to check for DVT. Pt was negative for DVT but still has swelling and "extreme" pain in leg. Pt wants advice from [place].  Routed to [place]

## 2020-01-21 NOTE — Telephone Encounter (Signed)
Daughter called.  DVT evaluation was negative.  Still has pain in the leg.  He has an appointment with his PCP tomorrow.  They want something for pain to last until the PCP appointment.  I called in tramadol for him.  I advised the family must stay with him while he is on tramadol since it can cause drowsiness and falls.  Must go to the ER if worse instead of better at any time

## 2020-01-22 ENCOUNTER — Other Ambulatory Visit: Payer: Self-pay

## 2020-01-22 ENCOUNTER — Ambulatory Visit: Payer: Medicare Other | Admitting: Internal Medicine

## 2020-01-22 ENCOUNTER — Ambulatory Visit (INDEPENDENT_AMBULATORY_CARE_PROVIDER_SITE_OTHER): Payer: Medicare Other

## 2020-01-22 ENCOUNTER — Encounter: Payer: Self-pay | Admitting: Internal Medicine

## 2020-01-22 ENCOUNTER — Telehealth: Payer: Self-pay | Admitting: Internal Medicine

## 2020-01-22 ENCOUNTER — Ambulatory Visit (INDEPENDENT_AMBULATORY_CARE_PROVIDER_SITE_OTHER): Payer: Medicare Other | Admitting: Internal Medicine

## 2020-01-22 VITALS — BP 120/62 | HR 68 | Temp 97.6°F | Ht 74.0 in | Wt 184.0 lb

## 2020-01-22 DIAGNOSIS — M25571 Pain in right ankle and joints of right foot: Secondary | ICD-10-CM | POA: Diagnosis not present

## 2020-01-22 DIAGNOSIS — E1140 Type 2 diabetes mellitus with diabetic neuropathy, unspecified: Secondary | ICD-10-CM

## 2020-01-22 DIAGNOSIS — Z9181 History of falling: Secondary | ICD-10-CM | POA: Diagnosis not present

## 2020-01-22 DIAGNOSIS — N184 Chronic kidney disease, stage 4 (severe): Secondary | ICD-10-CM | POA: Diagnosis not present

## 2020-01-22 DIAGNOSIS — M109 Gout, unspecified: Secondary | ICD-10-CM

## 2020-01-22 DIAGNOSIS — R2241 Localized swelling, mass and lump, right lower limb: Secondary | ICD-10-CM | POA: Diagnosis not present

## 2020-01-22 DIAGNOSIS — Z794 Long term (current) use of insulin: Secondary | ICD-10-CM

## 2020-01-22 DIAGNOSIS — Z789 Other specified health status: Secondary | ICD-10-CM

## 2020-01-22 DIAGNOSIS — M2011 Hallux valgus (acquired), right foot: Secondary | ICD-10-CM | POA: Diagnosis not present

## 2020-01-22 DIAGNOSIS — I5022 Chronic systolic (congestive) heart failure: Secondary | ICD-10-CM | POA: Diagnosis not present

## 2020-01-22 DIAGNOSIS — M25471 Effusion, right ankle: Secondary | ICD-10-CM

## 2020-01-22 DIAGNOSIS — M19071 Primary osteoarthritis, right ankle and foot: Secondary | ICD-10-CM | POA: Diagnosis not present

## 2020-01-22 DIAGNOSIS — Z79899 Other long term (current) drug therapy: Secondary | ICD-10-CM

## 2020-01-22 DIAGNOSIS — M7989 Other specified soft tissue disorders: Secondary | ICD-10-CM | POA: Diagnosis not present

## 2020-01-22 DIAGNOSIS — I255 Ischemic cardiomyopathy: Secondary | ICD-10-CM | POA: Diagnosis not present

## 2020-01-22 LAB — POCT GLYCOSYLATED HEMOGLOBIN (HGB A1C)
HbA1c POC (<> result, manual entry): 6.4 % (ref 4.0–5.6)
Hemoglobin A1C: 6.4 % — AB (ref 4.0–5.6)

## 2020-01-22 MED ORDER — CEPHALEXIN 500 MG PO CAPS: 500.0000 mg | ORAL_CAPSULE | Freq: Four times a day (QID) | ORAL | 0 refills | Status: DC

## 2020-01-22 NOTE — Telephone Encounter (Signed)
Called [phone] no  answer left VM requesting return call.
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
## 2020-01-22 NOTE — Patient Instructions (Addendum)
  X ray and labs today  Elevated  To ed if worse over weekend  Adding antibiotic in case infection on top of  Injury   Due for yearly labs  Dm in control at 6.4   Plan rov next week to check foot   May need to see orthopedics  In follow up

## 2020-01-22 NOTE — Telephone Encounter (Signed)
Pts daughter (Ms Jiles Garter) called in wanting to make an appointment since the pt had a fall this morning and that EMS had been called and had to kick the door down.  Caller was advise to speak with the Triage Nurse per the provider she was transferred and she called back and refused to speak with them or go to UC or the ER due to those areas have [place].  Caller was very aggressive and stated that she just need to make an appointment.  Caller is aware that it maybe a possibility that pt may be sent to the ER from the office she state they will decline to go.  Provider is aware that they are declining to go to the ER.

## 2020-01-22 NOTE — Telephone Encounter (Signed)
Patient came in for an appointment on 01/22/2020.

## 2020-01-22 NOTE — Telephone Encounter (Signed)
If it is one-sided leg swelling, probably needs evaluation by PCP for infection, etc.  Would also make sure he has appt for followup with [place] here soon.

## 2020-01-23 LAB — CBC WITH DIFFERENTIAL/PLATELET
Absolute Monocytes: 1131 cells/uL — ABNORMAL HIGH (ref 200–950)
Basophils Absolute: 26 cells/uL (ref 0–200)
Basophils Relative: 0.2 %
Eosinophils Absolute: 26 cells/uL (ref 15–500)
Eosinophils Relative: 0.2 %
HCT: 36.4 % — ABNORMAL LOW (ref 38.5–50.0)
Hemoglobin: 12 g/dL — ABNORMAL LOW (ref 13.2–17.1)
Lymphs Abs: 1625 cells/uL (ref 850–3900)
MCH: 31.8 pg (ref 27.0–33.0)
MCHC: 33 g/dL (ref 32.0–36.0)
MCV: 96.6 fL (ref 80.0–100.0)
MPV: 11.7 fL (ref 7.5–12.5)
Monocytes Relative: 8.7 %
Neutro Abs: 10192 cells/uL — ABNORMAL HIGH (ref 1500–7800)
Neutrophils Relative %: 78.4 %
Platelets: 187 10*3/uL (ref 140–400)
RBC: 3.77 10*6/uL — ABNORMAL LOW (ref 4.20–5.80)
RDW: 12.1 % (ref 11.0–15.0)
Total Lymphocyte: 12.5 %
WBC: 13 10*3/uL — ABNORMAL HIGH (ref 3.8–10.8)

## 2020-01-23 LAB — LIPID PANEL
Cholesterol: 84 mg/dL (ref <200)
HDL: 39 mg/dL — ABNORMAL LOW (ref >=40)
LDL Cholesterol (Calc): 29 mg/dL (calc)
Non-HDL Cholesterol (Calc): 45 mg/dL (calc) (ref <130)
Total CHOL/HDL Ratio: 2.2 (calc) (ref <5.0)
Triglycerides: 84 mg/dL (ref <150)

## 2020-01-23 LAB — BASIC METABOLIC PANEL WITH GFR
BUN/Creatinine Ratio: 24 (calc) — ABNORMAL HIGH (ref 6–22)
BUN: 47 mg/dL — ABNORMAL HIGH (ref 7–25)
CO2: 28 mmol/L (ref 20–32)
Calcium: 8.7 mg/dL (ref 8.6–10.3)
Chloride: 104 mmol/L (ref 98–110)
Creat: 1.92 mg/dL — ABNORMAL HIGH (ref 0.70–1.11)
GFR, Est African American: 35 mL/min/{1.73_m2} — ABNORMAL LOW (ref >=60)
GFR, Est Non African American: 30 mL/min/{1.73_m2} — ABNORMAL LOW (ref >=60)
Glucose, Bld: 89 mg/dL (ref 65–99)
Potassium: 4.2 mmol/L (ref 3.5–5.3)
Sodium: 142 mmol/L (ref 135–146)

## 2020-01-23 LAB — TSH: TSH: 1.93 mIU/L (ref 0.40–4.50)

## 2020-01-23 LAB — HEPATIC FUNCTION PANEL
AG Ratio: 1.5 (calc) (ref 1.0–2.5)
ALT: 9 U/L (ref 9–46)
AST: 15 U/L (ref 10–35)
Albumin: 3.4 g/dL — ABNORMAL LOW (ref 3.6–5.1)
Alkaline phosphatase (APISO): 60 U/L (ref 35–144)
Bilirubin, Direct: 0.2 mg/dL (ref 0.0–0.2)
Globulin: 2.2 g/dL (calc) (ref 1.9–3.7)
Indirect Bilirubin: 0.4 mg/dL (calc) (ref 0.2–1.2)
Total Bilirubin: 0.6 mg/dL (ref 0.2–1.2)
Total Protein: 5.6 g/dL — ABNORMAL LOW (ref 6.1–8.1)

## 2020-01-23 LAB — SEDIMENTATION RATE: Sed Rate: 51 mm/h — ABNORMAL HIGH (ref 0–20)

## 2020-01-23 LAB — URIC ACID: Uric Acid, Serum: 10.7 mg/dL — ABNORMAL HIGH (ref 4.0–8.0)

## 2020-01-23 LAB — C-REACTIVE PROTEIN: CRP: 172.2 mg/L — ABNORMAL HIGH (ref <8.0)

## 2020-01-25 ENCOUNTER — Telehealth: Payer: Self-pay | Admitting: Internal Medicine

## 2020-01-25 ENCOUNTER — Other Ambulatory Visit: Payer: Self-pay

## 2020-01-25 DIAGNOSIS — R2241 Localized swelling, mass and lump, right lower limb: Secondary | ICD-10-CM

## 2020-01-25 NOTE — Progress Notes (Signed)
X ray shows no acute fracture but lots of swellin g Blood work shows elevated inflammation markers otherwise stable readings. I advise he be seen at  ortho office this week in follow up   Please arrange  acute urgent referral [facility]( wife seen in that practice ) Can  update how he is doing after weekend on antibiotic

## 2020-01-25 NOTE — Telephone Encounter (Signed)
Pt is calling to get his lab results and would like to have a call back.  Pt is aware that when they have been resulted they will give him a call with the results.  Pt stated that he is in so much pain and would like to have more medication for the pain.  Pt was advised if the pain is to bad to go to the UC or ER pt verbalized understanding.  Pt would to have call back.

## 2020-01-25 NOTE — Telephone Encounter (Signed)
Pt spouse calling again and would like to know if Dr. Regis Bill can call in pain medication. Right foot is still painful and swollen and red.   [facility] [place], [place]

## 2020-01-26 MED ORDER — HYDROCODONE-ACETAMINOPHEN 5-325 MG PO TABS: 1.0000 | ORAL_TABLET | Freq: Four times a day (QID) | ORAL | 0 refills | Status: DC | PRN

## 2020-01-26 NOTE — Telephone Encounter (Signed)
Family is aware - pt has appt on the 12th with ortho, pt has no allergies & has taken hydrocodone before. Advised not to take the tramadol while taking the hydrocodone.

## 2020-01-26 NOTE — Progress Notes (Signed)
See previous noted results   labs stable  but elevated inflammation markers and  inflammation on wbc   x ray no  acute bony issues  Should have been referred to ortho  urgently  .Marland KitchenMarland Kitchen

## 2020-01-26 NOTE — Telephone Encounter (Signed)
See response from yesterday's result note and urgent referral to orthopedics. His labs were stable except for increased inflammatory markers. Uncertain if this from infection or other process in his foot.   Please facilitate make sure he has an orthopedic appointment this week. As far as pain I can send in a few  Hydrocodone in interim  if not allergic but careful to avoid fall  If his swelling is getting worse will need emergent evaluation.

## 2020-01-28 DIAGNOSIS — E1142 Type 2 diabetes mellitus with diabetic polyneuropathy: Secondary | ICD-10-CM | POA: Diagnosis not present

## 2020-01-28 DIAGNOSIS — L603 Nail dystrophy: Secondary | ICD-10-CM | POA: Diagnosis not present

## 2020-01-29 ENCOUNTER — Other Ambulatory Visit: Payer: Self-pay | Admitting: Family Medicine

## 2020-01-29 ENCOUNTER — Ambulatory Visit (INDEPENDENT_AMBULATORY_CARE_PROVIDER_SITE_OTHER): Payer: Medicare Other | Admitting: Family Medicine

## 2020-01-29 ENCOUNTER — Encounter: Payer: Self-pay | Admitting: Family Medicine

## 2020-01-29 VITALS — Ht 74.0 in | Wt 184.0 lb

## 2020-01-29 DIAGNOSIS — M79671 Pain in right foot: Secondary | ICD-10-CM | POA: Diagnosis not present

## 2020-01-29 MED ORDER — METHYLPREDNISOLONE 4 MG PO TBPK
ORAL_TABLET | ORAL | 0 refills | Status: DC
Start: 2020-01-29 — End: 2020-04-19

## 2020-01-29 MED ORDER — COLCHICINE 0.6 MG PO CAPS: 1.0000 | ORAL_CAPSULE | Freq: Two times a day (BID) | ORAL | 3 refills | Status: DC | PRN
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
## 2020-02-01 ENCOUNTER — Telehealth (HOSPITAL_COMMUNITY): Payer: Self-pay | Admitting: Cardiology

## 2020-02-01 MED ORDER — DIGOXIN 125 MCG PO TABS: ORAL_TABLET | ORAL | 3 refills | Status: DC

## 2020-02-01 NOTE — Telephone Encounter (Signed)
Pt request digoxin refill, please send script to [facility], [address], pt can be reached @[phone].

## 2020-02-02 ENCOUNTER — Other Ambulatory Visit: Payer: Self-pay | Admitting: Family Medicine

## 2020-02-02 ENCOUNTER — Ambulatory Visit (INDEPENDENT_AMBULATORY_CARE_PROVIDER_SITE_OTHER): Payer: Medicare Other

## 2020-02-02 DIAGNOSIS — I255 Ischemic cardiomyopathy: Secondary | ICD-10-CM | POA: Diagnosis not present

## 2020-02-02 LAB — CUP PACEART REMOTE DEVICE CHECK
Battery Remaining Longevity: 67 mo
Battery Remaining Percentage: 95.5 %
Battery Voltage: 2.98 V
Brady Statistic AP VP Percent: 97 %
Brady Statistic AP VS Percent: 1 %
Brady Statistic AS VP Percent: 2.8 %
Brady Statistic AS VS Percent: 1 %
Brady Statistic RA Percent Paced: 97 %
Date Time Interrogation Session: 20211115040022
Implantable Lead Implant Date: 20051221
Implantable Lead Implant Date: 20150304
Implantable Lead Implant Date: 20150304
Implantable Lead Location: 753858
Implantable Lead Location: 753859
Implantable Lead Location: 753860
Implantable Lead Model: 1581
Implantable Pulse Generator Implant Date: 20200810
Lead Channel Impedance Value: 380 Ohm
Lead Channel Impedance Value: 410 Ohm
Lead Channel Impedance Value: 430 Ohm
Lead Channel Pacing Threshold Amplitude: 0.75 V
Lead Channel Pacing Threshold Amplitude: 1.25 V
Lead Channel Pacing Threshold Amplitude: 2.25 V
Lead Channel Pacing Threshold Pulse Width: 0.5 ms
Lead Channel Pacing Threshold Pulse Width: 0.7 ms
Lead Channel Pacing Threshold Pulse Width: 1 ms
Lead Channel Sensing Intrinsic Amplitude: 12 mV
Lead Channel Sensing Intrinsic Amplitude: 2.4 mV
Lead Channel Setting Pacing Amplitude: 1.5 V
Lead Channel Setting Pacing Amplitude: 2.5 V
Lead Channel Setting Pacing Amplitude: 2.5 V
Lead Channel Setting Pacing Pulse Width: 0.5 ms
Lead Channel Setting Pacing Pulse Width: 1.2 ms
Lead Channel Setting Sensing Sensitivity: 2 mV
Pulse Gen Model: 3562
Pulse Gen Serial Number: 9129173

## 2020-02-02 MED ORDER — COLCHICINE 0.6 MG PO CAPS
1.0000 | ORAL_CAPSULE | Freq: Two times a day (BID) | ORAL | 3 refills | Status: DC | PRN
Start: 2020-02-02 — End: 2020-02-15

## 2020-02-03 NOTE — Progress Notes (Signed)
Remote pacemaker transmission.   

## 2020-02-08 ENCOUNTER — Telehealth: Payer: Self-pay | Admitting: Internal Medicine

## 2020-02-08 NOTE — Telephone Encounter (Signed)
Please advise this

## 2020-02-08 NOTE — Telephone Encounter (Signed)
Pt  Daughter call and stated he want to go and see a rheumatologist and want a call back.

## 2020-02-09 NOTE — Addendum Note (Signed)
Addended by: Lynda Rainwater on: 02/09/2020 04:57 PM   Modules accepted: Orders

## 2020-02-09 NOTE — Telephone Encounter (Signed)
Referral placed, family aware that someone will give them a call to schedule an appointment. Per [name] he would like the referral for gout in his right ankle he have completed the prednisone that was given and would like something to take to help the attacks from coming back. There are no new medications that he is currently taking.

## 2020-02-09 NOTE — Telephone Encounter (Signed)
Please contact  Them and get more info   Ok to refer  To rheumatology     Is this about his foot?    They thought it was gout  Please give [place] an update on how he is doing.  And any new meds he may be on.

## 2020-02-10 ENCOUNTER — Telehealth: Payer: Self-pay | Admitting: Internal Medicine

## 2020-02-10 MED ORDER — PRAMIPEXOLE DIHYDROCHLORIDE 0.5 MG PO TABS: ORAL_TABLET | ORAL | 1 refills | Status: DC

## 2020-02-10 MED ORDER — SILODOSIN 8 MG PO CAPS
ORAL_CAPSULE | ORAL | 1 refills | Status: DC
Start: 2020-02-10 — End: 2020-04-25

## 2020-02-10 NOTE — Telephone Encounter (Signed)
[facility]   For 6 mos worth

## 2020-02-10 NOTE — Telephone Encounter (Signed)
Patient's wife seen in office today. States that the Patient is need [facility]. She is requesting 90 day supplies with 3 refills.   Requested Silodosin and Pramipexole. Both [facility] 01/01/20 90 days 3 refills.   Informed the Patient's wife and she states she will [facility] and have them send this out.

## 2020-02-14 ENCOUNTER — Encounter: Payer: Self-pay | Admitting: Family Medicine

## 2020-02-15 ENCOUNTER — Telehealth: Payer: Self-pay

## 2020-02-15 ENCOUNTER — Other Ambulatory Visit: Payer: Self-pay | Admitting: Family Medicine

## 2020-02-15 MED ORDER — COLCHICINE 0.6 MG PO CAPS: 1.0000 | ORAL_CAPSULE | Freq: Two times a day (BID) | ORAL | 3 refills | Status: DC | PRN

## 2020-02-15 MED ORDER — PREDNISONE 10 MG PO TABS: ORAL_TABLET | ORAL | 0 refills | Status: DC

## 2020-02-15 NOTE — Telephone Encounter (Signed)
Patient called stating that his right foot is still swollen and hot. Would like a call back to discuss?  Cb# [phone].  Please advise.  Thank you.

## 2020-02-15 NOTE — Telephone Encounter (Signed)
Duplicate message - Dr. Junius Roads has corresponded with the patient this morning through [facility].

## 2020-02-15 NOTE — Telephone Encounter (Signed)
See duplicate message to Ortho.

## 2020-02-15 NOTE — Addendum Note (Signed)
Addended by: Hortencia Pilar on: 02/15/2020 10:50 AM   Modules accepted: Orders

## 2020-02-18 DIAGNOSIS — Z23 Encounter for immunization: Secondary | ICD-10-CM | POA: Diagnosis not present

## 2020-02-29 ENCOUNTER — Telehealth: Payer: Self-pay | Admitting: Internal Medicine

## 2020-02-29 NOTE — Telephone Encounter (Addendum)
Patient is calling and stated that he was very restless and is not being able to get any sleep and wanted to see if provider could send her a prescription to [facility] [address], [place], [place] [address]  Phone:  [phone] Fax:  [phone]  CB is [phone]

## 2020-02-29 NOTE — Telephone Encounter (Addendum)
Called patient to schedule an appointment and per patient he will call back when she gets home and looks at her calendar to make an appointment.

## 2020-02-29 NOTE — Telephone Encounter (Signed)
We need an appt with one of the providers in the office.

## 2020-03-01 DIAGNOSIS — C44329 Squamous cell carcinoma of skin of other parts of face: Secondary | ICD-10-CM | POA: Diagnosis not present

## 2020-03-01 DIAGNOSIS — C44622 Squamous cell carcinoma of skin of right upper limb, including shoulder: Secondary | ICD-10-CM | POA: Diagnosis not present

## 2020-03-10 ENCOUNTER — Telehealth (INDEPENDENT_AMBULATORY_CARE_PROVIDER_SITE_OTHER): Payer: Medicare Other | Admitting: Internal Medicine

## 2020-03-10 DIAGNOSIS — Z72820 Sleep deprivation: Secondary | ICD-10-CM | POA: Diagnosis not present

## 2020-03-10 DIAGNOSIS — R5383 Other fatigue: Secondary | ICD-10-CM

## 2020-03-10 DIAGNOSIS — Z789 Other specified health status: Secondary | ICD-10-CM | POA: Diagnosis not present

## 2020-03-10 NOTE — Progress Notes (Signed)
Virtual Visit via Telephone Note  I connected with@ on 03/10/20 at  9:30 AM EST by telephone and verified that I am speaking with the correct person using two identifiers.   I discussed the limitations, risks, security and privacy concerns of performing an evaluation and management service by telephone and the limited availability of in person appointments. tThere may be a patient responsible charge related to this service. The patient expressed understanding and agreed to proceed.  Location patient: home Location provider: work or home office Participants present for the call: patient, provider and wife   Patient did not have a visit in the prior 7 days to address this/these issue(s).   History of Present Illness: [name] presents because of fatigue and tired and nervousness because his wife has not slept well since home from rehab hospital for the last 3 weeks or so. She had major back surgery and was in rehab after that. Since she has been home her pain is getting better she is under pain management but she has not been able to sleep on a regular basis and she gets up frequently waking [name] up and now is exhausted and feels nerve written. In regard to his health his swelling of his foot felt to be gout is better diabetes heart other medicines are stable.  Feels that he would sleep well if had the chance.  He has no low blood sugars today it was in the 90s so. During this visit I also talked with [name] about her situation.  She was given Ambien 2.5 and over the last 2 days has used it but it has not really been helpful.    Observations/Objective: Patient sounds personable and well on the phone. I do not appreciate any SOB. Speech and thought processing are grossly intact. Patient reported vitals: Lab Results  Component Value Date   WBC 13.0 (H) 01/22/2020   HGB 12.0 (L) 01/22/2020   HCT 36.4 (L) 01/22/2020   PLT 187 01/22/2020   GLUCOSE 89 01/22/2020   CHOL  84 01/22/2020   TRIG 84 01/22/2020   HDL 39 (L) 01/22/2020   LDLCALC 29 01/22/2020   ALT 9 01/22/2020   AST 15 01/22/2020   NA 142 01/22/2020   K 4.2 01/22/2020   CL 104 01/22/2020   CREATININE 1.92 (H) 01/22/2020   BUN 47 (H) 01/22/2020   CO2 28 01/22/2020   TSH 1.93 01/22/2020   PSA 2.47 08/02/2010   INR 1.05 02/13/2016   HGBA1C 6.4 (A) 01/22/2020   HGBA1C 6.4 01/22/2020   MICROALBUR 0.7 11/16/2016    Assessment and Plan:  Other fatigue  Sleep deprivation  Medically complex patient   This time we will not add medicine for sleep for  [name] but working with [name] and have her increase her Ambien we will send a message to her pain doctor.  Was also discovered that mirtazapine which was written as a med she never received as a trial. At this time I think the risk of sleep or anxiety medicine may be more than the benefit and if [name] situation improves his will 2. Make follow-up appointment in person after the first of the year Let us know how things are going next week.  Consider other visit   Follow Up Instructions:  See above    [id number] 5-10 [id number] 11-20 [id number] 21-30 I did not refer this patient for an OV in the next 24 hours for this/these issue(s).  I  discussed the assessment and treatment plan with the patient. The patient was provided an opportunity to ask questions and answered. The patient agreed with the plan and demonstrated an understanding of the instructions.   The patient was advised to call back or seek an in-person evaluation if the symptoms worsen or if the condition fails to improve as anticipated.  I provided  32 minutes of non-face-to-face time during this encounter. Return for ov after  january 1   update [place] next week  about status .  [name] Ace, MD

## 2020-03-14 NOTE — Telephone Encounter (Signed)
Sorry med didn't work  I haven't heard from [name] yet  So I sent in the mirtazapine to take 7.5 - 15 mg at night the medicine that was on [name]'s list but she is never taken it. It is a medicine that causes drowsiness and has been used for anxiety and psychiatrist will use it for sleep at night. All these medicine should have caution for falling  Stop the ambien if not working anyway   Keep me updated let me know  How doing by end of week   Prefer  messages come in on [name]'s records so we can have best continuity of care   SOB means shortness of breath.

## 2020-03-19 HISTORY — PX: SKIN CANCER EXCISION: SHX779

## 2020-03-22 ENCOUNTER — Encounter: Payer: Medicare Other | Admitting: Internal Medicine

## 2020-03-24 DIAGNOSIS — D0462 Carcinoma in situ of skin of left upper limb, including shoulder: Secondary | ICD-10-CM | POA: Diagnosis not present

## 2020-03-24 DIAGNOSIS — C44629 Squamous cell carcinoma of skin of left upper limb, including shoulder: Secondary | ICD-10-CM | POA: Diagnosis not present
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
## 2020-03-25 ENCOUNTER — Other Ambulatory Visit: Payer: Self-pay

## 2020-03-25 ENCOUNTER — Encounter: Payer: Self-pay | Admitting: Internal Medicine

## 2020-03-25 ENCOUNTER — Ambulatory Visit (INDEPENDENT_AMBULATORY_CARE_PROVIDER_SITE_OTHER): Payer: Medicare Other | Admitting: Internal Medicine

## 2020-03-25 VITALS — BP 128/65 | HR 69 | Ht 70.0 in | Wt 186.0 lb

## 2020-03-25 DIAGNOSIS — M109 Gout, unspecified: Secondary | ICD-10-CM | POA: Insufficient documentation

## 2020-03-25 DIAGNOSIS — N1832 Chronic kidney disease, stage 3b: Secondary | ICD-10-CM

## 2020-03-25 DIAGNOSIS — M10371 Gout due to renal impairment, right ankle and foot: Secondary | ICD-10-CM

## 2020-03-25 MED ORDER — ALLOPURINOL 100 MG PO TABS: 50.0000 mg | ORAL_TABLET | Freq: Every day | ORAL | 0 refills | Status: DC

## 2020-03-25 NOTE — Patient Instructions (Signed)
Start allopurinol 50mg  (0.5 tablets) daily.  Allopurinol tablets What is this medicine? ALLOPURINOL (al oh PURE i nole) reduces the amount of uric acid the body makes. It is used to treat the symptoms of gout. It is also used to treat or prevent high uric acid levels that occur as a result of certain types of chemotherapy. This medicine may also help patients who frequently have kidney stones. This medicine may be used for other purposes; ask your health care provider or pharmacist if you have questions. COMMON BRAND NAME(S): Zyloprim What should I tell my health care provider before I take this medicine? They need to know if you have any of these conditions:  kidney disease  liver disease  an unusual or allergic reaction to allopurinol, other medicines, foods, dyes, or preservatives  pregnant or trying to get pregnant  breast feeding How should I use this medicine? Take this medicine by mouth with a glass of water. Follow the directions on the prescription label. If this medicine upsets your stomach, take it with food or milk. Take your doses at regular intervals. Do not take your medicine more often than directed. Talk to your pediatrician regarding the use of this medicine in children. Special care may be needed. While this drug may be prescribed for children as young as 6 years for selected conditions, precautions do apply. Overdosage: If you think you have taken too much of this medicine contact a poison control center or emergency room at once. NOTE: This medicine is only for you. Do not share this medicine with others. What if I miss a dose? If you miss a dose, take it as soon as you can. If it is almost time for your next dose, take only that dose. Do not take double or extra doses. What may interact with this medicine? Do not take this medicine with the following medication:  didanosine, ddI This medicine may also interact with the following medications:  certain antibiotics  like amoxicillin, ampicillin  certain medicines for cancer  certain medicines for immunosuppression like azathioprine, cyclosporine, mercaptopurine  chlorpropamide  probenecid  thiazide diuretics, like hydrochlorothiazide  sulfinpyrazone  warfarin This list may not describe all possible interactions. Give your health care provider a list of all the medicines, herbs, non-prescription drugs, or dietary supplements you use. Also tell them if you smoke, drink alcohol, or use illegal drugs. Some items may interact with your medicine. What should I watch for while using this medicine? Visit your doctor or healthcare provider for regular checks on your progress. If you are taking this medicine to treat gout, you may not have less frequent attacks at first. Keep taking your medicine regularly and the attacks should get better within 2 to 6 weeks. Drink plenty of water (10 to 12 full glasses a day) while you are taking this medicine. This will help to reduce stomach upset and reduce the risk of getting gout or kidney stones. Call your doctor or healthcare provider at once if you get a skin rash together with chills, fever, sore throat, or nausea and vomiting, if you have blood in your urine, or difficulty passing urine. This medicine may cause serious skin reactions. They can happen weeks to months after starting the medicine. Contact your healthcare provider right away if you notice fevers or flu-like symptoms with a rash. The rash may be red or purple and then turn into blisters or peeling of the skin. Or, you might notice a red rash with swelling of the face,  lips or lymph nodes in your neck or under your arms. Do not take vitamin C without asking your doctor or healthcare provider. Too much vitamin C can increase the chance of getting kidney stones. You may get drowsy or dizzy. Do not drive, use machinery, or do anything that needs mental alertness until you know how this drug affects you. Do not  stand or sit up quickly, especially if you are an older patient. This reduces the risk of dizzy or fainting spells. Alcohol can make you more drowsy and dizzy. Alcohol can also increase the chance of stomach problems and increase the amount of uric acid in your blood. Avoid alcoholic drinks. What side effects may I notice from receiving this medicine? Side effects that you should report to your doctor or health care professional as soon as possible:  allergic reactions like skin rash, itching or hives, swelling of the face, lips, or tongue  breathing problems  joint pain  muscle pain  rash, fever, and swollen lymph nodes  redness, blistering, peeling, or loosening of the skin, including inside the mouth  signs and symptoms of infection like fever or chills; cough; sore throat  signs and symptoms of kidney injury like trouble passing urine or change in the amount of urine, flank pain  tingling, numbness in the hands or feet  unusual bleeding or bruising  unusually weak or tired Side effects that usually do not require medical attention (report to your doctor or health care professional if they continue or are bothersome):  changes in taste  diarrhea  drowsiness  headache  nausea, vomiting  stomach upset This list may not describe all possible side effects. Call your doctor for medical advice about side effects. You may report side effects to FDA at 1-800-FDA-1088. Where should I keep my medicine? Keep out of the reach of children. Store at room temperature between 15 and 25 degrees C (59 and 77 degrees F). Protect from light and moisture. Throw away any unused medicine after the expiration date.   Gout  Gout is painful swelling of your joints. Gout is a type of arthritis. It is caused by having too much uric acid in your body. Uric acid is a chemical that is made when your body breaks down substances called purines. If your body has too much uric acid, sharp crystals can  form and build up in your joints. This causes pain and swelling. Gout attacks can happen quickly and be very painful (acute gout). Over time, the attacks can affect more joints and happen more often (chronic gout). What are the causes?  Too much uric acid in your blood. This can happen because: ? Your kidneys do not remove enough uric acid from your blood. ? Your body makes too much uric acid. ? You eat too many foods that are high in purines. These foods include organ meats, some seafood, and beer.  Trauma or stress. What increases the risk?  Having a family history of gout.  Being male and middle-aged.  Being male and having gone through menopause.  Being very overweight (obese).  Drinking alcohol, especially beer.  Not having enough water in the body (being dehydrated).  Losing weight too quickly.  Having an organ transplant.  Having lead poisoning.  Taking certain medicines.  Having kidney disease.  Having a skin condition called psoriasis. What are the signs or symptoms? An attack of acute gout usually happens in just one joint. The most common place is the big toe. Attacks often  start at night. Other joints that may be affected include joints of the feet, ankle, knee, fingers, wrist, or elbow. Symptoms of an attack may include:  Very bad pain.  Warmth.  Swelling.  Stiffness.  Shiny, red, or purple skin.  Tenderness. The affected joint may be very painful to touch.  Chills and fever. Chronic gout may cause symptoms more often. More joints may be involved. You may also have white or yellow lumps (tophi) on your hands or feet or in other areas near your joints. How is this treated?  Treatment for this condition has two phases: treating an acute attack and preventing future attacks.  Acute gout treatment may include: ? NSAIDs. ? Steroids. These are taken by mouth or injected into a joint. ? Colchicine. This medicine relieves pain and swelling. It can be  given by mouth or through an IV tube.  Preventive treatment may include: ? Taking small doses of NSAIDs or colchicine daily. ? Using a medicine that reduces uric acid levels in your blood. ? Making changes to your diet. You may need to see a food expert (dietitian) about what to eat and drink to prevent gout. Follow these instructions at home: During a gout attack   If told, put ice on the painful area: ? Put ice in a plastic bag. ? Place a towel between your skin and the bag. ? Leave the ice on for 20 minutes, 2-3 times a day.  Raise (elevate) the painful joint above the level of your heart as often as you can.  Rest the joint as much as possible. If the joint is in your leg, you may be given crutches.  Follow instructions from your doctor about what you cannot eat or drink. Avoiding future gout attacks  Eat a low-purine diet. Avoid foods and drinks such as: ? Liver. ? Kidney. ? Anchovies. ? Asparagus. ? Herring. ? Mushrooms. ? Mussels. ? Beer.  Stay at a healthy weight. If you want to lose weight, talk with your doctor. Do not lose weight too fast.  Start or continue an exercise plan as told by your doctor. Eating and drinking  Drink enough fluids to keep your pee (urine) pale yellow.  If you drink alcohol: ? Limit how much you use to:  0-1 drink a day for women.  0-2 drinks a day for men. ? Be aware of how much alcohol is in your drink. In the [place] one drink equals one 12 oz bottle of beer (355 mL), one 5 oz glass of wine (148 mL), or one 1 oz glass of hard liquor (44 mL). General instructions  Take over-the-counter and prescription medicines only as told by your doctor.  Do not drive or use heavy machinery while taking prescription pain medicine.  Return to your normal activities as told by your doctor. Ask your doctor what activities are safe for you.  Keep all follow-up visits as told by your doctor. This is important. Contact a doctor if:  You have  another gout attack.  You still have symptoms of a gout attack after 10 days of treatment.  You have problems (side effects) because of your medicines.  You have chills or a fever.  You have burning pain when you pee (urinate).  You have pain in your lower back or belly. Get help right away if:  You have very bad pain.  Your pain cannot be controlled.  You cannot pee. Summary  Gout is painful swelling of the joints.  The most  common site of pain is the big toe, but it can affect other joints.  Medicines and avoiding some foods can help to prevent and treat gout attacks.    Low-Purine Eating Plan A low-purine eating plan involves making food choices to limit your intake of purine. Purine is a kind of uric acid. Too much uric acid in your blood can cause certain conditions, such as gout and kidney stones. Eating a low-purine diet can help control these conditions. What are tips for following this plan? Reading food labels   Avoid foods with saturated or Trans fat.  Check the ingredient list of grains-based foods, such as bread and cereal, to make sure that they contain whole grains.  Check the ingredient list of sauces or soups to make sure they do not contain meat or fish.  When choosing soft drinks, check the ingredient list to make sure they do not contain high-fructose corn syrup. Shopping  Buy plenty of fresh fruits and vegetables.  Avoid buying canned or fresh fish.  Buy dairy products labeled as low-fat or nonfat.  Avoid buying premade or processed foods. These foods are often high in fat, salt (sodium), and added sugar. Cooking  Use olive oil instead of butter when cooking. Oils like olive oil, canola oil, and sunflower oil contain healthy fats. Meal planning  Learn which foods do or do not affect you. If you find out that a food tends to cause your gout symptoms to flare up, avoid eating that food. You can enjoy foods that do not cause problems. If you  have any questions about a food item, talk with your dietitian or health care provider.  Limit foods high in fat, especially saturated fat. Fat makes it harder for your body to get rid of uric acid.  Choose foods that are lower in fat and are lean sources of protein. General guidelines  Limit alcohol intake to no more than 1 drink a day for nonpregnant women and 2 drinks a day for men. One drink equals 12 oz of beer, 5 oz of wine, or 1 oz of hard liquor. Alcohol can affect the way your body gets rid of uric acid.  Drink plenty of water to keep your urine clear or pale yellow. Fluids can help remove uric acid from your body.  If directed by your health care provider, take a vitamin C supplement.  Work with your health care provider and dietitian to develop a plan to achieve or maintain a healthy weight. Losing weight can help reduce uric acid in your blood. What foods are recommended? The items listed may not be a complete list. Talk with your dietitian about what dietary choices are best for you. Foods low in purines Foods low in purines do not need to be limited. These include:  All fruits.  All low-purine vegetables, pickles, and olives.  Breads, pasta, Harel Repetto, cornbread, and popcorn. Cake and other baked goods.  All dairy foods.  Eggs, nuts, and nut butters.  Spices and condiments, such as salt, herbs, and vinegar.  Plant oils, butter, and margarine.  Water, sugar-free soft drinks, tea, coffee, and cocoa.  Vegetable-based soups, broths, sauces, and gravies. Foods moderate in purines Foods moderate in purines should be limited to the amounts listed.   cup of asparagus, cauliflower, spinach, mushrooms, or green peas, each day.  2/3 cup uncooked oatmeal, each day.   cup dry wheat bran or wheat germ, each day.  2-3 ounces of meat or poultry, each day.  4-6  ounces of shellfish, such as crab, lobster, oysters, or shrimp, each day.  1 cup cooked beans, peas, or lentils,  each day.  Soup, broths, or bouillon made from meat or fish. Limit these foods as much as possible. What foods are not recommended? The items listed may not be a complete list. Talk with your dietitian about what dietary choices are best for you. Limit your intake of foods high in purines, including:  Beer and other alcohol.  Meat-based gravy or sauce.  Canned or fresh fish, such as: ? Anchovies, sardines, herring, and tuna. ? Mussels and scallops. ? Codfish, trout, and haddock.  Berniece Salines.  Organ meats, such as: ? Liver or kidney. ? Tripe. ? Sweetbreads (thymus gland or pancreas).  [employer] bi[employer]ist.  Yeast or yeast extract supplements.  Drinks sweetened with high-fructose corn syrup. Summary  Eating a low-purine diet can help control conditions caused by too much uric acid in the body, such as gout or kidney stones.  Choose low-purine foods, limit alcohol, and limit foods high in fat.  You will learn over time which foods do or do not affect you. If you find out that a food tends to cause your gout symptoms to flare up, avoid eating that food.

## 2020-03-31 ENCOUNTER — Other Ambulatory Visit: Payer: Self-pay

## 2020-03-31 ENCOUNTER — Ambulatory Visit (INDEPENDENT_AMBULATORY_CARE_PROVIDER_SITE_OTHER): Payer: Medicare Other | Admitting: Physician Assistant

## 2020-03-31 ENCOUNTER — Encounter: Payer: Self-pay | Admitting: Physician Assistant

## 2020-03-31 VITALS — BP 96/58 | HR 70 | Ht 70.0 in | Wt 173.0 lb

## 2020-03-31 DIAGNOSIS — I10 Essential (primary) hypertension: Secondary | ICD-10-CM | POA: Diagnosis not present

## 2020-03-31 DIAGNOSIS — Z950 Presence of cardiac pacemaker: Secondary | ICD-10-CM | POA: Diagnosis not present

## 2020-03-31 DIAGNOSIS — I480 Paroxysmal atrial fibrillation: Secondary | ICD-10-CM | POA: Diagnosis not present

## 2020-03-31 DIAGNOSIS — I5043 Acute on chronic combined systolic (congestive) and diastolic (congestive) heart failure: Secondary | ICD-10-CM | POA: Diagnosis not present

## 2020-03-31 DIAGNOSIS — I5022 Chronic systolic (congestive) heart failure: Secondary | ICD-10-CM | POA: Diagnosis not present

## 2020-03-31 DIAGNOSIS — I255 Ischemic cardiomyopathy: Secondary | ICD-10-CM | POA: Diagnosis not present
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
## 2020-03-31 NOTE — Patient Instructions (Signed)
Medication Instructions:   Your physician recommends that you continue on your current medications as directed. Please refer to the Current Medication list given to you today.   *If you need a refill on your cardiac medications before your next appointment, please call your pharmacy*   Lab Work: [place]     If you have labs (blood work) drawn today and your tests are completely normal, you will receive your results only by: [name] MyChart Message (if you have MyChart) OR . A paper copy in the mail If you have any lab test that is abnormal or we need to change your treatment, we will call you to review the results.   Testing/Procedures: NONE ORDERED  TODAY    Follow-Up: At [facility], you and your health needs are our priority.  As part of our continuing mission to provide you with exceptional heart care, we have created designated Provider Care Teams.  These Care Teams include your primary Cardiologist (physician) and Advanced Practice Providers (APPs -  Physician Assistants and Nurse Practitioners) who all work together to provide you with the care you need, when you need it.  We recommend signing up for the patient portal called "MyChart".  Sign up information is provided on this After Visit Summary.  MyChart is used to connect with patients for Virtual Visits (Telemedicine).  Patients are able to view lab/test results, encounter notes, upcoming appointments, etc.  Non-urgent messages can be sent to your provider as well.   To learn more about what you can do with MyChart, go to [email or web address].    Your next appointment:   1 year(s)  The format for your next appointment:   In Person  Provider:   You may see Dr. Caryl Comes   Other Instructions

## 2020-04-12 DIAGNOSIS — D0439 Carcinoma in situ of skin of other parts of face: Secondary | ICD-10-CM | POA: Diagnosis not present

## 2020-04-12 DIAGNOSIS — D0462 Carcinoma in situ of skin of left upper limb, including shoulder: Secondary | ICD-10-CM | POA: Diagnosis not present

## 2020-04-12 DIAGNOSIS — L570 Actinic keratosis: Secondary | ICD-10-CM | POA: Diagnosis not present

## 2020-04-19 ENCOUNTER — Other Ambulatory Visit: Payer: Self-pay

## 2020-04-19 ENCOUNTER — Ambulatory Visit (HOSPITAL_COMMUNITY)
Admission: RE | Admit: 2020-04-19 | Discharge: 2020-04-19 | Disposition: A | Discharge status: Home / Self Care | Payer: Medicare Other | Source: Ambulatory Visit | Attending: Cardiology | Admitting: Cardiology

## 2020-04-19 ENCOUNTER — Encounter (HOSPITAL_COMMUNITY): Payer: Self-pay | Admitting: Cardiology

## 2020-04-19 VITALS — BP 110/62 | HR 61 | Wt 178.0 lb

## 2020-04-19 DIAGNOSIS — I5022 Chronic systolic (congestive) heart failure: Secondary | ICD-10-CM | POA: Diagnosis not present

## 2020-04-19 DIAGNOSIS — Z87891 Personal history of nicotine dependence: Secondary | ICD-10-CM | POA: Diagnosis not present

## 2020-04-19 DIAGNOSIS — Z79899 Other long term (current) drug therapy: Secondary | ICD-10-CM | POA: Diagnosis not present

## 2020-04-19 DIAGNOSIS — Z7984 Long term (current) use of oral hypoglycemic drugs: Secondary | ICD-10-CM | POA: Insufficient documentation

## 2020-04-19 DIAGNOSIS — I255 Ischemic cardiomyopathy: Secondary | ICD-10-CM | POA: Diagnosis not present

## 2020-04-19 DIAGNOSIS — E038 Other specified hypothyroidism: Secondary | ICD-10-CM

## 2020-04-19 DIAGNOSIS — I480 Paroxysmal atrial fibrillation: Secondary | ICD-10-CM

## 2020-04-19 DIAGNOSIS — I5042 Chronic combined systolic (congestive) and diastolic (congestive) heart failure: Secondary | ICD-10-CM | POA: Diagnosis not present

## 2020-04-19 DIAGNOSIS — Z794 Long term (current) use of insulin: Secondary | ICD-10-CM | POA: Insufficient documentation

## 2020-04-19 DIAGNOSIS — Z9989 Dependence on other enabling machines and devices: Secondary | ICD-10-CM | POA: Insufficient documentation

## 2020-04-19 DIAGNOSIS — Z951 Presence of aortocoronary bypass graft: Secondary | ICD-10-CM | POA: Insufficient documentation

## 2020-04-19 DIAGNOSIS — M109 Gout, unspecified: Secondary | ICD-10-CM | POA: Insufficient documentation

## 2020-04-19 DIAGNOSIS — I130 Hypertensive heart and chronic kidney disease with heart failure and stage 1 through stage 4 chronic kidney disease, or unspecified chronic kidney disease: Secondary | ICD-10-CM | POA: Diagnosis not present

## 2020-04-19 DIAGNOSIS — Z7901 Long term (current) use of anticoagulants: Secondary | ICD-10-CM | POA: Insufficient documentation

## 2020-04-19 DIAGNOSIS — N183 Chronic kidney disease, stage 3 unspecified: Secondary | ICD-10-CM | POA: Insufficient documentation

## 2020-04-19 DIAGNOSIS — E1122 Type 2 diabetes mellitus with diabetic chronic kidney disease: Secondary | ICD-10-CM | POA: Insufficient documentation

## 2020-04-19 DIAGNOSIS — I2510 Atherosclerotic heart disease of native coronary artery without angina pectoris: Secondary | ICD-10-CM | POA: Insufficient documentation

## 2020-04-19 DIAGNOSIS — Z9181 History of falling: Secondary | ICD-10-CM | POA: Diagnosis not present

## 2020-04-19 DIAGNOSIS — G4733 Obstructive sleep apnea (adult) (pediatric): Secondary | ICD-10-CM | POA: Diagnosis not present

## 2020-04-19 LAB — CBC
HCT: 40.1 % (ref 39.0–52.0)
Hemoglobin: 12.4 g/dL — ABNORMAL LOW (ref 13.0–17.0)
MCH: 30.7 pg (ref 26.0–34.0)
MCHC: 30.9 g/dL (ref 30.0–36.0)
MCV: 99.3 fL (ref 80.0–100.0)
Platelets: 205 10*3/uL (ref 150–400)
RBC: 4.04 MIL/uL — ABNORMAL LOW (ref 4.22–5.81)
RDW: 14.3 % (ref 11.5–15.5)
WBC: 12 10*3/uL — ABNORMAL HIGH (ref 4.0–10.5)
nRBC: 0 % (ref 0.0–0.2)

## 2020-04-19 LAB — COMPREHENSIVE METABOLIC PANEL
ALT: 14 U/L (ref 0–44)
AST: 18 U/L (ref 15–41)
Albumin: 3.4 g/dL — ABNORMAL LOW (ref 3.5–5.0)
Alkaline Phosphatase: 56 U/L (ref 38–126)
Anion gap: 9 (ref 5–15)
BUN: 41 mg/dL — ABNORMAL HIGH (ref 8–23)
CO2: 26 mmol/L (ref 22–32)
Calcium: 8.8 mg/dL — ABNORMAL LOW (ref 8.9–10.3)
Chloride: 106 mmol/L (ref 98–111)
Creatinine, Ser: 1.84 mg/dL — ABNORMAL HIGH (ref 0.61–1.24)
GFR, Estimated: 35 mL/min — ABNORMAL LOW (ref >60)
Glucose, Bld: 83 mg/dL (ref 70–99)
Potassium: 4.2 mmol/L (ref 3.5–5.1)
Sodium: 141 mmol/L (ref 135–145)
Total Bilirubin: 0.4 mg/dL (ref 0.3–1.2)
Total Protein: 6 g/dL — ABNORMAL LOW (ref 6.5–8.1)

## 2020-04-19 LAB — TSH: TSH: 1.846 u[IU]/mL (ref 0.350–4.500)

## 2020-04-19 LAB — DIGOXIN LEVEL: Digoxin Level: 0.8 ng/mL (ref 0.8–2.0)

## 2020-04-19 MED ORDER — DAPAGLIFLOZIN PROPANEDIOL 10 MG PO TABS
10.0000 mg | ORAL_TABLET | Freq: Every day | ORAL | 2 refills | Status: DC
Start: 2020-04-19 — End: 2020-07-14

## 2020-04-19 MED FILL — FARXIGA 10 MG TABLET: 10 | 30 days supply | Qty: 30 | Fill #0

## 2020-04-19 NOTE — Patient Instructions (Signed)
Labs done today. We will contact you only if your labs are abnormal.  START [name] 10mg  (1 tablet) by mouth daily.   No other medication changes were made. Please continue all current medications as prescribed.  Your physician recommends that you schedule a follow-up appointment in: 10 days for a lab only appointment and in 3 months for an appointment with [name]. [name] for an echo prior to your exam.  Your physician has requested that you have a carotid duplex. This test is an ultrasound of the carotid arteries in your neck. It looks at blood flow through these arteries that supply the brain with blood. Allow one hour for this exam. There are no restrictions or special instructions. This has to be approved through your insurance prior to scheduling;once approved we will contact you to schedule an appointment.   If you have any questions or concerns before your next appointment please send [place] a message through [place] or call our office at [phone].    TO LEAVE A MESSAGE FOR THE NURSE SELECT OPTION 2, PLEASE LEAVE A MESSAGE INCLUDING: . YOUR NAME . DATE OF BIRTH . CALL BACK NUMBER . REASON FOR CALL**this is important as we prioritize the call backs  YOU WILL RECEIVE A CALL BACK THE SAME DAY AS LONG AS YOU CALL BEFORE 4:00 PM   Do the following things EVERYDAY: 1) Weigh yourself in the morning before breakfast. Write it down and keep it in a log. 2) Take your medicines as prescribed 3) Eat low salt foods--Limit salt (sodium) to 2000 mg per day.  4) Stay as active as you can everyday 5) Limit all fluids for the day to less than 2 liters   At the [facility], you and your health needs are our priority. As part of our continuing mission to provide you with exceptional heart care, we have created designated Provider Care Teams. These Care Teams include your primary Cardiologist (physician) and Advanced Practice Providers (APPs- Physician Assistants and Nurse  Practitioners) who all work together to provide you with the care you need, when you need it.   You may see any of the following providers on your designated Care Team at your next follow up: [name] [name] [name] . [name] . [name], [name] . [name], [name] . [name], PharmD   Please be sure to bring in all your medications bottles to every appointment.

## 2020-04-20 NOTE — Progress Notes (Signed)
Date:  04/20/2020   ID:  [name], DOB [birth year], MRN [id number]  Provider location:  Advanced Heart Failure Type of Visit: Established patient   PCP:  Burnis Medin, MD  Cardiologist:  Dr. [name]   History of Present Illness: [name] is a 87 y.o. male who has history of CAD s/p CABG, ischemic cardiomyopathy, and paroxysmal atrial fibrillation.  Patient was admitted in 2/16 with CHF and diuresed, he was sent home on Lasix 60 mg bid.  He missed 3 days of the pm Lasix dose and was re-admitted on 05/18/14 with acute on chronic systolic CHF with dyspnea and hypoxemia. He was diuresed with Lasix gtt and metolazone.  While in the hospital, he went into atrial fibrillation with RVR requiring cardioversion.  Creatinine was elevated and ramipril was stopped.  We had to cut back on Coreg due to hypotension and low output.  He was begun on low dose digoxin.    Echo in 7/20 showed EF 20-25% with LAD territory akinesis, mildly decreased RV systolic function, moderate AS with mild AI.   Patient returns for followup of CHF.  He has been under stress recently, wife had back surgery and he has been caring for her. He is short of breath walking about 200 feet.  Able to do the housework and his ADLs without dyspnea.  No chest pain.  No lightheadedness.  Poor balance, has had falls.  Walks with cane.  Gout pain is improved.   St Jude device interrogation: No AF, no VT, > 99% BiV paced, stable thoracic impedance.    ECG (personally reviewed): NSR, BiV pacing  Labs (3/16): K 4.2, creatinine 2.26, digoxin < 0.2 Labs (05/31/2014): TSH 2.3 Dig level 0.6  Labs (06/14/2014): K 4.5 Creatinine 2.14, LDL 28, HDL 46 Labs (6/16): K 4.5, creatinine 2.08, BNP 712 Labs (7/16): K 4.1, creatinine 2.18, HCT 36.8, LFTs normal, digoxin 0.9 Labs (10/16): LDL 26, HDL 38, TSH normal, LFTs normal Labs (12/16): K 4.6, creatinine 2 Labs (2/17): LFTs normal, digoxin 1.1 Labs (4/17): K 4.7, creatinine  1.97, BUN 50, hgb 12.8, TSH normal, digoxin 0.8, LDL 29 Labs (7/17): digoxin 0.6 Labs (11/17): K 4.4, creatinine 2.13 Labs (12/17): hgb 11.4 Labs (5/18): LFTs normal, K 4.6, creatinne 2.21, hgb 10.9 Labs (7/19): Cr 1.97, K 4.4, Hgb 13.2, Hgb A1c 7.1. LFTs normal.  LDL 25 Labs (9/19): K 4.2, creatinine 1.96 Labs (12/19): TSH normal, digoxin 0.7, LFTs normal, LDL 24 Labs (1/20): hgb 13.4 Labs (4/20): LFTs normal, digoxin 0.7, TSH normal Labs (5/20): K 4.1, creatinine 1.91 Labs (11/21): K 4.2, creatinine 1.92, LDL 29, HDL 39, TSH normal, LFTs normal  PMH: 1. CAD: S/p CABG 2005 with LIMA-Diagonal, SVG-LAD, SVG-LCx, SVG-RCA.  Cardiolite in 2/15 with EF 19%, no ischemia.  2. Ischemic cardiomyopathy: Echo (2/16) with EF 20-25%, severe LV dilation with RWMAs, moderate AI, moderate MR, moderately decreased RV systolic function.  St Jude CRT-D device.  - Echo 10/2016 LVEF 20-25% Grade 1 DD, Mild/Mod MR, mild LAE, Mildly reduced RV function, Mild RAE, Peak PA pressure 32 mmHg - Echo (7/20): EF 20-25%, LAD territory AK, normal RV size with mildly decreased systolic function, mild AI with moderate AS (mean gradient 24 mmHg).  3. Atrial fibrillation: Paroxysmal.  DCCV 3/16.  4. CKD stage 3 5. Carotid stenosis: Carotid dopplers (1/16) with 60-79% RICA stenosis.  Carotids (1/17) with bilateral 40-59% ICA stenosis.  - Carotids (1/18): 40-59% BICA stenosis.  - Carotids (5/20): 40-59%  RICA stenosis.  - Carotids (6/21): 40-59% RICA stenosis.  6. HTN 7. Hyperlipidemia 8. Restless leg syndrome. 9. Type 2 diabetes. 10. OA 11. Depression 12. H/o CCY 53. H/o appy 14. OSA: Uses CPAP.  15. ABIs (7/17): Normal 16. Melanoma: s/p excision. 17. Aortic stenosis: Moderate on 7/20 echo.   18. Gout  Current Outpatient Medications  Medication Sig Dispense Refill  . ACETAMINOPHEN EXTRA STRENGTH 500 MG tablet 1 Add'l Sig oral Select Frequency    . allopurinol (ZYLOPRIM) 100 MG tablet Take 0.5 tablets (50 mg  total) by mouth daily. 30 tablet 0  . amiodarone (PACERONE) 200 MG tablet TAKE 1/2 TABLET (100MG TOTAL) DAILY 45 tablet 3  . apixaban (ELIQUIS) 2.5 MG TABS tablet Take 1 tablet (2.5 mg total) by mouth 2 (two) times daily. 270 tablet 3  . atorvastatin (LIPITOR) 40 MG tablet TAKE 1 TABLET DAILY 90 tablet 3  . betamethasone, augmented, (DIPROLENE) 0.05 % lotion APPLY SMALL AMOUNT TO AFFECTED AREA OF LEFT EAR TWICE DAILY AS DIRECTED    . Blood Glucose Monitoring Suppl (ACCU-CHEK AVIVA PLUS) w/Device KIT Check blood sugars daily up to three times daily or as needed. 1 kit prn  . carvedilol (COREG) 12.5 MG tablet TAKE 1 TABLET TWICE A DAY (CANCEL ALL PREVIOUS ORDERS FOR CURRENT MEDICATION. CHANGE IN DOSAGE OR TABLET SIZE) 180 tablet 3  . Colchicine (MITIGARE) 0.6 MG CAPS Take 1 capsule by mouth 2 (two) times daily as needed. 60 capsule 3  . dapagliflozin propanediol (FARXIGA) 10 MG TABS tablet Take 1 tablet (10 mg total) by mouth daily before breakfast. 30 tablet 2  . digoxin (LANOXIN) 0.125 MG tablet TAKE 1/2 TABLET BY MOUTH DAILY 45 tablet 3  . escitalopram (LEXAPRO) 10 MG tablet Take 1 tablet (10 mg total) by mouth daily. Please schedule follow up with Dr. Regis Bill for refills. [phone] Thank you! 90 tablet 0  . glipiZIDE (GLUCOTROL) 10 MG tablet Take 1 tablet (10 mg total) by mouth daily before breakfast. Patient needs to schedule follow up visit with Dr. Regis Bill for refills. [phone] 90 tablet 0  . glucose blood (ACCU-CHEK AVIVA PLUS) test strip Check blood sugars up to three times daily as needed. Please schedule your follow up with labs with Dr. Regis Bill. [phone] 300 strip 0  . hydrALAZINE (APRESOLINE) 25 MG tablet Take 1.5 tablets (37.75 mg total) by mouth every 8 (eight) hours. 405 tablet 0  . insulin glargine (LANTUS) 100 unit/mL SOPN Inject 30 Units into the skin at bedtime.     . Insulin Pen Needle (BD PEN NEEDLE NANO U/F) 32G X 4 MM MISC [place] TO TEST BLOOD SUGAR THREE TIMES DAILY 300 each  1  . isosorbide mononitrate (IMDUR) 60 MG 24 hr tablet TAKE 1 TABLET DAILY 90 tablet 3  . KLOR-CON M20 20 MEQ tablet TAKE 2 TABLETS (40MEQ      TOTAL) DAILY 180 tablet 3  . LEVEMIR FLEXTOUCH 100 UNIT/ML Pen 1 Add'l Sig subcutaneous Select Frequency    . Multiple Vitamins-Minerals (PRESERVISION AREDS 2) CAPS Take 1 capsule by mouth 2 (two) times daily.     Glory Rosebush DELICA LANCETS FINE MISC Use to test blood sugar 2-3 times daily 300 each 1  . polyethylene glycol (MIRALAX) packet Take 17 g by mouth daily. (Patient taking differently: Take 17 g by mouth daily as needed for mild constipation.) 14 each 0  . pramipexole (MIRAPEX) 0.5 MG tablet TAKE 1 TABLET 2 TO 3 HOURS BEFORE SLEEP FOR RESTLESS LEG 90 tablet 1  .  Semaglutide (OZEMPIC) 0.25 or 0.5 MG/DOSE SOPN Inject 0.5 mg into the skin once a week. 1.5 mL 3  . silodosin (RAPAFLO) 8 MG CAPS capsule TAKE 1 CAPSULE DAILY WITH BREAKFAST 90 capsule 1  . spironolactone (ALDACTONE) 25 MG tablet TAKE 1 TABLET DAILY 90 tablet 3  . torsemide (DEMADEX) 20 MG tablet Take 2 tablets (40 mg total) by mouth daily. Needs appt for further refills 180 tablet 1  . UNABLE TO FIND Use to test blood sugar 2-3 times daily    . vitamin B-12 (CYANOCOBALAMIN) 1000 MCG tablet Take 1,000 mcg by mouth daily.    [name] gabapentin (NEURONTIN) 100 MG capsule Take 1 capsule (100 mg total) by mouth 3 (three) times daily. Can increase as directed (Patient taking differently: Take 100 mg by mouth 3 (three) times daily as needed (pain).) 90 capsule 3   No current facility-administered medications for this encounter.    Allergies:   Patient has no known allergies.   Social History:  The patient  reports that he quit smoking about 42 years ago. His smoking use included cigarettes. He has a 40.00 pack-year smoking history. He has quit using smokeless tobacco.  His smokeless tobacco use included chew. He reports that he does not drink alcohol and does not use drugs.   Family History:  The  patient's family history includes COPD in his father; Healthy in his daughter, daughter, and daughter.   ROS:  Please see the history of present illness.   All other systems are personally reviewed and negative.   Exam:   BP 110/62   Pulse 61   Wt 80.7 kg (178 lb)   SpO2 96%   BMI 25.54 kg/m  General: NAD Neck: JVP 8 cm, no thyromegaly or thyroid nodule.  Lungs: Clear to auscultation bilaterally with normal respiratory effort. CV: Nondisplaced PMI.  Heart regular S1/S2, no S3/S4, 2/6 early SEM RUSB. Trace ankle edema.  Bilateral soft carotid bruits.  Normal pedal pulses.  Abdomen: Soft, nontender, no hepatosplenomegaly, no distention.  Skin: Intact without lesions or rashes.  Neurologic: Alert and oriented x 3.  Psych: Normal affect. Extremities: No clubbing or cyanosis.  HEENT: Normal.   Recent Labs: 04/19/2020: ALT 14; BUN 41; Creatinine, Ser 1.84; Hemoglobin 12.4; Platelets 205; Potassium 4.2; Sodium 141; TSH 1.846  Personally reviewed   Wt Readings from Last 3 Encounters:  04/19/20 80.7 kg (178 lb)  03/31/20 78.5 kg (173 lb)  03/25/20 84.4 kg (186 lb)      ASSESSMENT AND PLAN:  1. Chronic systolic CHF: Ischemic cardiomyopathy.  St Jude CRT-D.  Echo 10/2016 LVEF 20-25%. NYHA class II-III symptoms.  He is >99% BiV paced on device interrogation today.  Echo in 7/20 showed that EF remains 20-25%.  He is not volume overloaded on exam, stable thoracic impedance on Corevue.   - Continue torsemide 40 mg daily.  Check BMET.     - Add Farxiga 10 mg daily. BMET 10 days.  - Continue 60 mg daily and hydralazine to 37.5 mg tid.  - He has been off ACEI/ARB/ARNI with elevated creatinine.  - Continue spironolactone 25 mg daily.   - Continue current digoxin, check level.   - Continue Coreg 12.5 mg bid.  - I will arrange for repeat echo.  2. Atrial fibrillation: Paroxysmal.  ICD interrogation today showed no atrial fibrillation.   - Continue amiodarone 100 mg daily.  Check TSH and LFTs  today.  He will need a regular eye exam.   - Continue  Eliquis (dosed at 2.5 mg bid with age and renal dysfunction).   3. CKD: Stage 3.  - BMET today. - Add dapagliflozin as above.  4. CAD: No chest pain.  - Continue on statin, good lipids in 11/21.    - He is not on ASA 81 given stable CAD and Eliquis use.   5. Carotid stenosis: [place] 6/21 with 40-59% RICA.  - Repeat carotid dopplers 6/22.  6. OSA: Continue nightly CPAP.   Followup 3 months.   Signed, [name], MD  04/20/2020  [facility] [address] [facility] [place] [address] [phone] (office) [phone] (fax)
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
## 2020-04-21 ENCOUNTER — Other Ambulatory Visit: Payer: Self-pay

## 2020-04-21 ENCOUNTER — Ambulatory Visit (INDEPENDENT_AMBULATORY_CARE_PROVIDER_SITE_OTHER): Payer: Medicare Other | Admitting: Internal Medicine

## 2020-04-21 ENCOUNTER — Telehealth: Payer: Self-pay | Admitting: Radiology

## 2020-04-21 ENCOUNTER — Encounter: Payer: Self-pay | Admitting: Internal Medicine

## 2020-04-21 VITALS — BP 89/44 | HR 66 | Ht 74.0 in | Wt 184.4 lb

## 2020-04-21 DIAGNOSIS — M10371 Gout due to renal impairment, right ankle and foot: Secondary | ICD-10-CM

## 2020-04-21 DIAGNOSIS — N1832 Chronic kidney disease, stage 3b: Secondary | ICD-10-CM

## 2020-04-21 NOTE — Addendum Note (Signed)
Encounter addended by: Malena Edman, RN on: 04/21/2020 11:37 AM  Actions taken: Order list changed, Diagnosis association updated

## 2020-04-21 NOTE — Telephone Encounter (Signed)
Spoke with cardiovascular lab, asked for uric acid to be added on if possible.

## 2020-04-25 ENCOUNTER — Other Ambulatory Visit: Payer: Self-pay | Admitting: Internal Medicine

## 2020-04-25 MED ORDER — GLIPIZIDE 10 MG PO TABS: 10.0000 mg | ORAL_TABLET | Freq: Every day | ORAL | 1 refills | Status: DC

## 2020-04-25 MED ORDER — SILODOSIN 8 MG PO CAPS: ORAL_CAPSULE | ORAL | 1 refills | Status: DC

## 2020-04-25 MED ORDER — ESCITALOPRAM OXALATE 10 MG PO TABS: 10.0000 mg | ORAL_TABLET | Freq: Every day | ORAL | 1 refills | Status: AC

## 2020-04-25 MED ORDER — PRAMIPEXOLE DIHYDROCHLORIDE 0.5 MG PO TABS: ORAL_TABLET | ORAL | 1 refills | Status: DC

## 2020-04-25 NOTE — Progress Notes (Signed)
Wife request refills for husband Record review has been seen by a number of clinicians labs appear up-to-date We will refill times 90 days x 1 refill but still needs follow-up in person visits to go over his meds.

## 2020-04-26 ENCOUNTER — Telehealth: Payer: Self-pay | Admitting: Radiology

## 2020-04-26 ENCOUNTER — Other Ambulatory Visit (HOSPITAL_COMMUNITY): Payer: Self-pay

## 2020-04-26 DIAGNOSIS — I5042 Chronic combined systolic (congestive) and diastolic (congestive) heart failure: Secondary | ICD-10-CM

## 2020-04-26 NOTE — Telephone Encounter (Signed)
[name], with Dr. Claris Gladden office, has future ordered a uric acid which will be completed at patient's visit at Dr. Claris Gladden office on Friday 04/29/2020. Spoke with patient's wife to make them aware.

## 2020-04-29 ENCOUNTER — Other Ambulatory Visit (HOSPITAL_COMMUNITY): Payer: Self-pay | Admitting: Cardiology

## 2020-04-29 ENCOUNTER — Other Ambulatory Visit: Payer: Self-pay

## 2020-04-29 ENCOUNTER — Ambulatory Visit (HOSPITAL_COMMUNITY)
Admission: RE | Admit: 2020-04-29 | Discharge: 2020-04-29 | Disposition: A | Discharge status: Home / Self Care | Payer: Medicare Other | Source: Ambulatory Visit | Attending: Cardiology | Admitting: Cardiology

## 2020-04-29 DIAGNOSIS — I10 Essential (primary) hypertension: Secondary | ICD-10-CM

## 2020-04-29 DIAGNOSIS — I110 Hypertensive heart disease with heart failure: Secondary | ICD-10-CM | POA: Diagnosis not present

## 2020-04-29 DIAGNOSIS — I5042 Chronic combined systolic (congestive) and diastolic (congestive) heart failure: Secondary | ICD-10-CM

## 2020-04-29 LAB — BASIC METABOLIC PANEL
Anion gap: 12 (ref 5–15)
BUN: 45 mg/dL — ABNORMAL HIGH (ref 8–23)
CO2: 25 mmol/L (ref 22–32)
Calcium: 8.8 mg/dL — ABNORMAL LOW (ref 8.9–10.3)
Chloride: 104 mmol/L (ref 98–111)
Creatinine, Ser: 2.18 mg/dL — ABNORMAL HIGH (ref 0.61–1.24)
GFR, Estimated: 28 mL/min — ABNORMAL LOW (ref >60)
Glucose, Bld: 188 mg/dL — ABNORMAL HIGH (ref 70–99)
Potassium: 4 mmol/L (ref 3.5–5.1)
Sodium: 141 mmol/L (ref 135–145)

## 2020-04-29 LAB — URIC ACID: Uric Acid, Serum: 6.7 mg/dL (ref 3.7–8.6)

## 2020-05-01 ENCOUNTER — Other Ambulatory Visit: Payer: Self-pay | Admitting: Internal Medicine

## 2020-05-01 DIAGNOSIS — M10371 Gout due to renal impairment, right ankle and foot: Secondary | ICD-10-CM

## 2020-05-01 MED ORDER — ALLOPURINOL 100 MG PO TABS: 100.0000 mg | ORAL_TABLET | Freq: Every day | ORAL | 0 refills | Status: DC

## 2020-05-02 ENCOUNTER — Telehealth (HOSPITAL_COMMUNITY): Payer: Self-pay | Admitting: *Deleted

## 2020-05-02 ENCOUNTER — Telehealth: Payer: Self-pay | Admitting: Radiology

## 2020-05-02 ENCOUNTER — Encounter: Payer: Self-pay | Admitting: Radiology

## 2020-05-02 DIAGNOSIS — I5042 Chronic combined systolic (congestive) and diastolic (congestive) heart failure: Secondary | ICD-10-CM

## 2020-05-02 MED ORDER — TORSEMIDE 20 MG PO TABS: ORAL_TABLET | ORAL | 1 refills | Status: DC

## 2020-05-02 NOTE — Telephone Encounter (Signed)
-----   Message from Larey Dresser, MD sent at 04/29/2020  4:46 PM EST ----- Decrease torsemide to 40 daily alternating with 20 daily (currently taking 40 daily). BMET 10 days.

## 2020-05-02 NOTE — Telephone Encounter (Signed)
Spoke with patient's wife, advised uric acid from 2/11 labs with cardiology office is 6.7. This remains above goal to prevent gout attacks so Dr. Benjamine Mola recommends he increase allopurinol from 0.5 tablet to 1 tablet daily. We will just need him to have repeat labs again 4 weeks after he increases this dose. Advised patient's wife Dr. Benjamine Mola has sent new Rx.

## 2020-05-02 NOTE — Telephone Encounter (Signed)
Pt and wife aware, agreeable, and verbalized understanding

## 2020-05-03 ENCOUNTER — Ambulatory Visit (INDEPENDENT_AMBULATORY_CARE_PROVIDER_SITE_OTHER): Payer: Medicare Other

## 2020-05-03 DIAGNOSIS — I255 Ischemic cardiomyopathy: Secondary | ICD-10-CM

## 2020-05-03 LAB — CUP PACEART REMOTE DEVICE CHECK
Battery Remaining Longevity: 67 mo
Battery Remaining Percentage: 95.5 %
Battery Voltage: 2.98 V
Brady Statistic AP VP Percent: 96 %
Brady Statistic AP VS Percent: 1 %
Brady Statistic AS VP Percent: 3.5 %
Brady Statistic AS VS Percent: 1 %
Brady Statistic RA Percent Paced: 96 %
Date Time Interrogation Session: 20220214040021
Implantable Lead Implant Date: 20051221
Implantable Lead Implant Date: 20150304
Implantable Lead Implant Date: 20150304
Implantable Lead Location: 753858
Implantable Lead Location: 753859
Implantable Lead Location: 753860
Implantable Lead Model: 1581
Implantable Pulse Generator Implant Date: 20200810
Lead Channel Impedance Value: 380 Ohm
Lead Channel Impedance Value: 380 Ohm
Lead Channel Impedance Value: 430 Ohm
Lead Channel Pacing Threshold Amplitude: 0.75 V
Lead Channel Pacing Threshold Amplitude: 1.25 V
Lead Channel Pacing Threshold Amplitude: 2.25 V
Lead Channel Pacing Threshold Pulse Width: 0.5 ms
Lead Channel Pacing Threshold Pulse Width: 0.5 ms
Lead Channel Pacing Threshold Pulse Width: 1.2 ms
Lead Channel Sensing Intrinsic Amplitude: 12 mV
Lead Channel Sensing Intrinsic Amplitude: 2.5 mV
Lead Channel Setting Pacing Amplitude: 1.5 V
Lead Channel Setting Pacing Amplitude: 2.5 V
Lead Channel Setting Pacing Amplitude: 2.5 V
Lead Channel Setting Pacing Pulse Width: 0.5 ms
Lead Channel Setting Pacing Pulse Width: 1.2 ms
Lead Channel Setting Sensing Sensitivity: 2 mV
Pulse Gen Model: 3562
Pulse Gen Serial Number: 9129173

## 2020-05-09 NOTE — Progress Notes (Signed)
Remote pacemaker transmission.   

## 2020-05-10 ENCOUNTER — Other Ambulatory Visit: Payer: Self-pay | Admitting: Internal Medicine

## 2020-05-10 DIAGNOSIS — M10371 Gout due to renal impairment, right ankle and foot: Secondary | ICD-10-CM

## 2020-05-10 NOTE — Telephone Encounter (Signed)
Needs a future appointment to follow up within 3 months. Approved.

## 2020-05-10 NOTE — Telephone Encounter (Signed)
Last Visit: 04/21/2020 Next Visit: due around 07/19/2020, no appointment scheduled Labs: 04/29/2020 Uric Acid: 6.7  Current Dose per office note 04/21/2020: After uric acid (04/29/2020) - 6.7, Dr. Benjamine Mola recommends increasing Allopurinol from 0.5 tablet to 1 tablet daily.  DX: Acute gout due to renal impairment involving right foot  Last Fill: 05/01/2020 30 tablets  Okay to refill Allopurinol?

## 2020-05-13 ENCOUNTER — Ambulatory Visit (HOSPITAL_COMMUNITY)
Admission: RE | Admit: 2020-05-13 | Discharge: 2020-05-13 | Disposition: A | Discharge status: Home / Self Care | Payer: Medicare Other | Source: Ambulatory Visit | Attending: Cardiology | Admitting: Cardiology

## 2020-05-13 ENCOUNTER — Other Ambulatory Visit: Payer: Self-pay

## 2020-05-13 DIAGNOSIS — I5042 Chronic combined systolic (congestive) and diastolic (congestive) heart failure: Secondary | ICD-10-CM | POA: Diagnosis not present

## 2020-05-13 LAB — BASIC METABOLIC PANEL
Anion gap: 10 (ref 5–15)
BUN: 51 mg/dL — ABNORMAL HIGH (ref 8–23)
CO2: 27 mmol/L (ref 22–32)
Calcium: 8.9 mg/dL (ref 8.9–10.3)
Chloride: 102 mmol/L (ref 98–111)
Creatinine, Ser: 2.19 mg/dL — ABNORMAL HIGH (ref 0.61–1.24)
GFR, Estimated: 28 mL/min — ABNORMAL LOW (ref >60)
Glucose, Bld: 173 mg/dL — ABNORMAL HIGH (ref 70–99)
Potassium: 4.1 mmol/L (ref 3.5–5.1)
Sodium: 139 mmol/L (ref 135–145)

## 2020-05-16 ENCOUNTER — Other Ambulatory Visit: Payer: Self-pay | Admitting: Internal Medicine

## 2020-05-16 ENCOUNTER — Telehealth (HOSPITAL_COMMUNITY): Payer: Self-pay

## 2020-05-16 DIAGNOSIS — M10371 Gout due to renal impairment, right ankle and foot: Secondary | ICD-10-CM

## 2020-05-16 DIAGNOSIS — I5042 Chronic combined systolic (congestive) and diastolic (congestive) heart failure: Secondary | ICD-10-CM

## 2020-05-16 MED ORDER — TORSEMIDE 20 MG PO TABS: 20.0000 mg | ORAL_TABLET | Freq: Every day | ORAL | 1 refills | Status: DC

## 2020-05-16 NOTE — Telephone Encounter (Signed)
-----   Message from Larey Dresser, MD sent at 05/15/2020  9:53 PM EST ----- Decrease torsemide to 20 mg daily, BMET 1 week.

## 2020-05-16 NOTE — Telephone Encounter (Signed)
Pts wife aware of results and rec to decrease torsemide to 20mg  daily and have labs repeated next week. Appt time given.

## 2020-05-26 ENCOUNTER — Other Ambulatory Visit: Payer: Self-pay

## 2020-05-26 ENCOUNTER — Ambulatory Visit (HOSPITAL_COMMUNITY)
Admission: RE | Admit: 2020-05-26 | Discharge: 2020-05-26 | Disposition: A | Discharge status: Home / Self Care | Payer: Medicare Other | Source: Ambulatory Visit | Attending: Internal Medicine | Admitting: Internal Medicine

## 2020-05-26 DIAGNOSIS — I5042 Chronic combined systolic (congestive) and diastolic (congestive) heart failure: Secondary | ICD-10-CM | POA: Insufficient documentation

## 2020-05-26 LAB — BASIC METABOLIC PANEL
Anion gap: 7 (ref 5–15)
BUN: 41 mg/dL — ABNORMAL HIGH (ref 8–23)
CO2: 28 mmol/L (ref 22–32)
Calcium: 9 mg/dL (ref 8.9–10.3)
Chloride: 107 mmol/L (ref 98–111)
Creatinine, Ser: 2 mg/dL — ABNORMAL HIGH (ref 0.61–1.24)
GFR, Estimated: 31 mL/min — ABNORMAL LOW (ref >60)
Glucose, Bld: 121 mg/dL — ABNORMAL HIGH (ref 70–99)
Potassium: 4.3 mmol/L (ref 3.5–5.1)
Sodium: 142 mmol/L (ref 135–145)

## 2020-06-03 ENCOUNTER — Other Ambulatory Visit: Payer: Self-pay

## 2020-06-03 ENCOUNTER — Encounter: Payer: Self-pay | Admitting: Family Medicine

## 2020-06-03 ENCOUNTER — Ambulatory Visit: Payer: Self-pay

## 2020-06-03 ENCOUNTER — Ambulatory Visit (INDEPENDENT_AMBULATORY_CARE_PROVIDER_SITE_OTHER): Payer: Medicare Other | Admitting: Family Medicine

## 2020-06-03 DIAGNOSIS — M25511 Pain in right shoulder: Secondary | ICD-10-CM

## 2020-06-03 DIAGNOSIS — G8929 Other chronic pain: Secondary | ICD-10-CM | POA: Diagnosis not present

## 2020-06-03 DIAGNOSIS — M10371 Gout due to renal impairment, right ankle and foot: Secondary | ICD-10-CM
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
## 2020-06-04 LAB — URIC ACID: Uric Acid, Serum: 5.6 mg/dL (ref 4.0–8.0)

## 2020-06-06 NOTE — Progress Notes (Signed)
Uric acid level is 5.6, which is improved so he can continue on the current dose of 100 mg daily allopurinol without any more increase at this time.

## 2020-06-13 DIAGNOSIS — C44622 Squamous cell carcinoma of skin of right upper limb, including shoulder: Secondary | ICD-10-CM | POA: Diagnosis not present

## 2020-06-13 DIAGNOSIS — L905 Scar conditions and fibrosis of skin: Secondary | ICD-10-CM | POA: Diagnosis not present

## 2020-06-13 DIAGNOSIS — Z85828 Personal history of other malignant neoplasm of skin: Secondary | ICD-10-CM | POA: Diagnosis not present

## 2020-06-13 DIAGNOSIS — C44629 Squamous cell carcinoma of skin of left upper limb, including shoulder: Secondary | ICD-10-CM | POA: Diagnosis not present

## 2020-06-13 DIAGNOSIS — D485 Neoplasm of uncertain behavior of skin: Secondary | ICD-10-CM | POA: Diagnosis not present

## 2020-06-30 DIAGNOSIS — C44622 Squamous cell carcinoma of skin of right upper limb, including shoulder: Secondary | ICD-10-CM | POA: Diagnosis not present

## 2020-07-07 ENCOUNTER — Telehealth: Payer: Self-pay | Admitting: Internal Medicine

## 2020-07-07 NOTE — Telephone Encounter (Signed)
Patient needs a Rx sent to the [facility] for [place] patches.  Dr. Waynette Buttery Fax number [phone]  The patient was also needing something sent to the [facility] to get a walk in shower instead of a tub because he is having trouble getting in and out of the bathtub because his legs are weak.

## 2020-07-10 NOTE — Telephone Encounter (Signed)
Message is confusing  Would like to help but  Not enough information    Would need an in person visit to  Be able  To address these new orders and requests  I dont lidocaine patch  on his med list  And I haven't prescribed the medication.    It appears that  Dr Modesta Messing is involved in  his recent chronic disease care.     Wouldn't  Dr Kathi Simpers of [facility]  other team providers such as ortho or rheumatology  be able to order these requests without needing a visit ?  Since they have been seeing him most recently   (last visit was video telephone  dec 21 with me )

## 2020-07-11 NOTE — Telephone Encounter (Signed)
Left a voice message for the patient to return my call. 
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
## 2020-07-12 NOTE — Telephone Encounter (Signed)
Ok will address at visit

## 2020-07-13 NOTE — Telephone Encounter (Signed)
Noted  

## 2020-07-14 ENCOUNTER — Other Ambulatory Visit (HOSPITAL_COMMUNITY): Payer: Self-pay | Admitting: Cardiology

## 2020-07-19 ENCOUNTER — Other Ambulatory Visit: Payer: Self-pay

## 2020-07-19 ENCOUNTER — Telehealth: Payer: Self-pay

## 2020-07-19 ENCOUNTER — Ambulatory Visit (INDEPENDENT_AMBULATORY_CARE_PROVIDER_SITE_OTHER): Payer: Medicare Other | Admitting: Internal Medicine

## 2020-07-19 ENCOUNTER — Encounter: Payer: Self-pay | Admitting: Internal Medicine

## 2020-07-19 VITALS — BP 108/60 | HR 63 | Temp 97.8°F | Ht 74.0 in | Wt 169.8 lb

## 2020-07-19 DIAGNOSIS — M25511 Pain in right shoulder: Secondary | ICD-10-CM

## 2020-07-19 DIAGNOSIS — R5383 Other fatigue: Secondary | ICD-10-CM

## 2020-07-19 DIAGNOSIS — I5022 Chronic systolic (congestive) heart failure: Secondary | ICD-10-CM

## 2020-07-19 DIAGNOSIS — R29898 Other symptoms and signs involving the musculoskeletal system: Secondary | ICD-10-CM | POA: Diagnosis not present

## 2020-07-19 DIAGNOSIS — E1140 Type 2 diabetes mellitus with diabetic neuropathy, unspecified: Secondary | ICD-10-CM

## 2020-07-19 DIAGNOSIS — I255 Ischemic cardiomyopathy: Secondary | ICD-10-CM

## 2020-07-19 DIAGNOSIS — Z789 Other specified health status: Secondary | ICD-10-CM

## 2020-07-19 DIAGNOSIS — E538 Deficiency of other specified B group vitamins: Secondary | ICD-10-CM

## 2020-07-19 DIAGNOSIS — Z79899 Other long term (current) drug therapy: Secondary | ICD-10-CM | POA: Diagnosis not present

## 2020-07-19 DIAGNOSIS — G8929 Other chronic pain: Secondary | ICD-10-CM | POA: Diagnosis not present

## 2020-07-19 DIAGNOSIS — Z794 Long term (current) use of insulin: Secondary | ICD-10-CM

## 2020-07-19 MED ORDER — LIDOCAINE 5 % EX PTCH: 1.0000 | MEDICATED_PATCH | CUTANEOUS | 3 refills | Status: AC

## 2020-07-19 NOTE — Patient Instructions (Addendum)
Will  Write   For low entry  Shower .  Lidoderm patches   Need  Lab update   And follow nutrition  Optimization.  Get me the lab results from the  [facility] .   And update the  Current med list.   PT   Home health To assess.and help get energy back up.   And help the leg feeling weak  But could be  Related to  Less walking from  The bad gout situation recently.   Plan follow up depending  Results .
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
## 2020-07-19 NOTE — Telephone Encounter (Signed)
Patient is requesting refills for Tramadol 50mg , Lorazepam 1mg , Movantik 25mg .

## 2020-07-20 LAB — CBC WITH DIFFERENTIAL/PLATELET
Basophils Absolute: 0.1 10*3/uL (ref 0.0–0.1)
Basophils Relative: 0.5 % (ref 0.0–3.0)
Eosinophils Absolute: 0 10*3/uL (ref 0.0–0.7)
Eosinophils Relative: 0.5 % (ref 0.0–5.0)
HCT: 37.2 % — ABNORMAL LOW (ref 39.0–52.0)
Hemoglobin: 12.1 g/dL — ABNORMAL LOW (ref 13.0–17.0)
Lymphocytes Relative: 19.2 % (ref 12.0–46.0)
Lymphs Abs: 1.8 10*3/uL (ref 0.7–4.0)
MCHC: 32.6 g/dL (ref 30.0–36.0)
MCV: 93.6 fl (ref 78.0–100.0)
Monocytes Absolute: 0.7 10*3/uL (ref 0.1–1.0)
Monocytes Relative: 7.1 % (ref 3.0–12.0)
Neutro Abs: 6.9 10*3/uL (ref 1.4–7.7)
Neutrophils Relative %: 72.7 % (ref 43.0–77.0)
Platelets: 183 10*3/uL (ref 150.0–400.0)
RBC: 3.98 Mil/uL — ABNORMAL LOW (ref 4.22–5.81)
RDW: 14.4 % (ref 11.5–15.5)
WBC: 9.5 10*3/uL (ref 4.0–10.5)

## 2020-07-20 LAB — BASIC METABOLIC PANEL
BUN: 55 mg/dL — ABNORMAL HIGH (ref 6–23)
CO2: 27 mEq/L (ref 19–32)
Calcium: 9.4 mg/dL (ref 8.4–10.5)
Chloride: 104 mEq/L (ref 96–112)
Creatinine, Ser: 2.18 mg/dL — ABNORMAL HIGH (ref 0.40–1.50)
GFR: 26.07 mL/min — ABNORMAL LOW (ref >60.00)
Glucose, Bld: 163 mg/dL — ABNORMAL HIGH (ref 70–99)
Potassium: 4.1 mEq/L (ref 3.5–5.1)
Sodium: 141 mEq/L (ref 135–145)

## 2020-07-20 LAB — HEPATIC FUNCTION PANEL
ALT: 13 U/L (ref 0–53)
AST: 15 U/L (ref 0–37)
Albumin: 3.9 g/dL (ref 3.5–5.2)
Alkaline Phosphatase: 62 U/L (ref 39–117)
Bilirubin, Direct: 0.1 mg/dL (ref 0.0–0.3)
Total Bilirubin: 0.5 mg/dL (ref 0.2–1.2)
Total Protein: 6 g/dL (ref 6.0–8.3)

## 2020-07-20 LAB — HEMOGLOBIN A1C: Hgb A1c MFr Bld: 6.8 % — ABNORMAL HIGH (ref 4.6–6.5)

## 2020-07-20 LAB — MAGNESIUM: Magnesium: 2.1 mg/dL (ref 1.5–2.5)

## 2020-07-20 LAB — TSH: TSH: 1.28 u[IU]/mL (ref 0.35–4.50)

## 2020-07-20 LAB — T4, FREE: Free T4: 1 ng/dL (ref 0.60–1.60)

## 2020-07-20 LAB — VITAMIN B12: Vitamin B-12: 1479 pg/mL — ABNORMAL HIGH (ref 211–911)

## 2020-07-20 LAB — DIGOXIN LEVEL: Digoxin Level: 0.6 mcg/L — ABNORMAL LOW (ref 0.8–2.0)

## 2020-07-20 NOTE — Progress Notes (Signed)
So B12 is in the high range thyroid magnesium normal kidney function about the same Diabetes A1c is controlled at 6.8, mild anemia about the same could be from the chronic renal disease. Digoxin level is l not too high.  So no further new obvious cause of your fatigue.    Please make sure the referral to home health for physical therapy leg weakness has been ordered.  See my visit from yesterday.
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
## 2020-07-21 ENCOUNTER — Other Ambulatory Visit: Payer: Self-pay

## 2020-07-21 ENCOUNTER — Ambulatory Visit (INDEPENDENT_AMBULATORY_CARE_PROVIDER_SITE_OTHER): Payer: Medicare Other | Admitting: Internal Medicine

## 2020-07-21 ENCOUNTER — Encounter: Payer: Self-pay | Admitting: Internal Medicine

## 2020-07-21 VITALS — BP 91/51 | HR 61 | Ht 74.0 in | Wt 170.0 lb

## 2020-07-21 DIAGNOSIS — H353130 Nonexudative age-related macular degeneration, bilateral, stage unspecified: Secondary | ICD-10-CM | POA: Insufficient documentation

## 2020-07-21 DIAGNOSIS — M10371 Gout due to renal impairment, right ankle and foot: Secondary | ICD-10-CM

## 2020-07-21 DIAGNOSIS — N1832 Chronic kidney disease, stage 3b: Secondary | ICD-10-CM

## 2020-07-22 ENCOUNTER — Ambulatory Visit (HOSPITAL_BASED_OUTPATIENT_CLINIC_OR_DEPARTMENT_OTHER)
Admission: RE | Admit: 2020-07-22 | Discharge: 2020-07-22 | Disposition: A | Discharge status: Home / Self Care | Payer: Medicare Other | Source: Ambulatory Visit | Attending: Cardiology | Admitting: Cardiology

## 2020-07-22 ENCOUNTER — Encounter (HOSPITAL_COMMUNITY): Payer: Self-pay | Admitting: Cardiology

## 2020-07-22 ENCOUNTER — Other Ambulatory Visit: Payer: Self-pay

## 2020-07-22 ENCOUNTER — Ambulatory Visit (HOSPITAL_COMMUNITY)
Admission: RE | Admit: 2020-07-22 | Discharge: 2020-07-22 | Disposition: A | Discharge status: Home / Self Care | Payer: Medicare Other | Source: Ambulatory Visit | Attending: Cardiology | Admitting: Cardiology

## 2020-07-22 VITALS — BP 102/60 | HR 62 | Wt 170.8 lb

## 2020-07-22 DIAGNOSIS — I083 Combined rheumatic disorders of mitral, aortic and tricuspid valves: Secondary | ICD-10-CM | POA: Insufficient documentation

## 2020-07-22 DIAGNOSIS — Z951 Presence of aortocoronary bypass graft: Secondary | ICD-10-CM | POA: Diagnosis not present

## 2020-07-22 DIAGNOSIS — E1122 Type 2 diabetes mellitus with diabetic chronic kidney disease: Secondary | ICD-10-CM | POA: Diagnosis not present

## 2020-07-22 DIAGNOSIS — Z0189 Encounter for other specified special examinations: Secondary | ICD-10-CM | POA: Diagnosis not present

## 2020-07-22 DIAGNOSIS — I5042 Chronic combined systolic (congestive) and diastolic (congestive) heart failure: Secondary | ICD-10-CM

## 2020-07-22 DIAGNOSIS — I2510 Atherosclerotic heart disease of native coronary artery without angina pectoris: Secondary | ICD-10-CM | POA: Diagnosis not present

## 2020-07-22 DIAGNOSIS — I130 Hypertensive heart and chronic kidney disease with heart failure and stage 1 through stage 4 chronic kidney disease, or unspecified chronic kidney disease: Secondary | ICD-10-CM | POA: Insufficient documentation

## 2020-07-22 DIAGNOSIS — E785 Hyperlipidemia, unspecified: Secondary | ICD-10-CM | POA: Insufficient documentation

## 2020-07-22 DIAGNOSIS — I480 Paroxysmal atrial fibrillation: Secondary | ICD-10-CM

## 2020-07-22 LAB — ECHOCARDIOGRAM COMPLETE
AR max vel: 0.93 cm2
AV Area VTI: 1.13 cm2
AV Area mean vel: 0.91 cm2
AV Mean grad: 12 mmHg
AV Peak grad: 21.5 mmHg
Ao pk vel: 2.32 m/s
P 1/2 time: 430 msec
S' Lateral: 6.1 cm

## 2020-07-22 LAB — URIC ACID: Uric Acid, Serum: 6 mg/dL (ref 4.0–8.0)

## 2020-07-22 MED ORDER — PERFLUTREN LIPID MICROSPHERE
1.0000 mL | INTRAVENOUS | Status: DC | PRN
  Administered 2020-07-22: 4 mL via INTRAVENOUS
  Filled 2020-07-22: qty 10

## 2020-07-22 NOTE — Progress Notes (Signed)
  Echocardiogram 2D Echocardiogram has been performed.  [name] 07/22/2020, 2:02 PM

## 2020-07-22 NOTE — Progress Notes (Signed)
Uric acid level is at goal of 6.0 at this time. He can continue allopurinol 100 mg daily with no changes needed at this time.

## 2020-07-22 NOTE — Patient Instructions (Addendum)
No Labs done today.  No medication changes were made. Please continue all current medications as prescribed.  You have been referred to Home Physical Therapy. They will contact you to schedule a home visit.   Your physician recommends that you schedule a follow-up appointment in: 3 months with our APP Clinic here in our office.    If you have any questions or concerns before your next appointment please send [place] a message through [place] or call our office at [phone].    TO LEAVE A MESSAGE FOR THE NURSE SELECT OPTION 2, PLEASE LEAVE A MESSAGE INCLUDING: . YOUR NAME . DATE OF BIRTH . CALL BACK NUMBER . REASON FOR CALL**this is important as we prioritize the call backs  YOU WILL RECEIVE A CALL BACK THE SAME DAY AS LONG AS YOU CALL BEFORE 4:00 PM   Do the following things EVERYDAY: 1) Weigh yourself in the morning before breakfast. Write it down and keep it in a log. 2) Take your medicines as prescribed 3) Eat low salt foods--Limit salt (sodium) to 2000 mg per day.  4) Stay as active as you can everyday 5) Limit all fluids for the day to less than 2 liters   At the [name], you and your health needs are our priority. As part of our continuing mission to provide you with exceptional heart care, we have created designated Provider Care Teams. These Care Teams include your primary Cardiologist (physician) and Advanced Practice Providers (APPs- Physician Assistants and Nurse Practitioners) who all work together to provide you with the care you need, when you need it.   You may see any of the following providers on your designated Care Team at your next follow up: [name] Dr Glori Bickers . Dr [name] . Darrick Grinder, NP . Lyda Jester, PA . Audry Riles, PharmD   Please be sure to bring in all your medications bottles to every appointment.

## 2020-07-23 NOTE — Telephone Encounter (Signed)
Thanks for the update    Pleases have nursing update med list

## 2020-07-23 NOTE — Telephone Encounter (Signed)
This message is in  The wrong record !. And I cant copy and paste it   Please  Correct this  Reasons for delays  I sent her a message

## 2020-07-24 NOTE — Progress Notes (Signed)
Date:  07/24/2020   ID:  [name], DOB 11-24-1928, MRN [id number]  Provider location: [place] [facility] Type of Visit: Established patient   PCP:  [name] Medin, MD  Cardiologist:  Dr. [name]   History of Present Illness: [name] is a 87 y.o. male who has history of CAD s/p CABG, ischemic cardiomyopathy, and paroxysmal atrial fibrillation.  Patient was admitted in 2/16 with CHF and diuresed, he was sent home on Lasix 60 mg bid.  He missed 3 days of the pm Lasix dose and was re-admitted on 05/18/14 with acute on chronic systolic CHF with dyspnea and hypoxemia. He was diuresed with Lasix gtt and metolazone.  While in the hospital, he went into atrial fibrillation with RVR requiring cardioversion.  Creatinine was elevated and ramipril was stopped.  We had to cut back on Coreg due to hypotension and low output.  He was begun on low dose digoxin.    Echo in 7/20 showed EF 20-25% with LAD territory akinesis, mildly decreased RV systolic function, moderate AS with mild AI.  Echo was done today and reviewed, EF 20-25%, apical/peri-apical akinesis, mildly decreased RV systolic function, mild MR, mild AI, low gradient moderate AS with mean gradient 12 mmHg and AVA 1.1 cm^2, IVC normal.   Patient returns for followup of CHF.  Weight is down 8 lbs.  Legs are generally weak, no claudication-type pain.  No falls.  No dizziness. No chest pain.  Short of breath after walking about 100-200 yards.  No orthopnea/PND.   St Jude device interrogation: No AF, no VT, > 99% BiV paced, thoracic impedance trending up.    Labs (3/16): K 4.2, creatinine 2.26, digoxin < 0.2 Labs (05/31/2014): TSH 2.3 Dig level 0.6  Labs (06/14/2014): K 4.5 Creatinine 2.14, LDL 28, HDL 46 Labs (6/16): K 4.5, creatinine 2.08, BNP 712 Labs (7/16): K 4.1, creatinine 2.18, HCT 36.8, LFTs normal, digoxin 0.9 Labs (10/16): LDL 26, HDL 38, TSH normal, LFTs normal Labs (12/16): K 4.6, creatinine 2 Labs  (2/17): LFTs normal, digoxin 1.1 Labs (4/17): K 4.7, creatinine 1.97, BUN 50, hgb 12.8, TSH normal, digoxin 0.8, LDL 29 Labs (7/17): digoxin 0.6 Labs (11/17): K 4.4, creatinine 2.13 Labs (12/17): hgb 11.4 Labs (5/18): LFTs normal, K 4.6, creatinne 2.21, hgb 10.9 Labs (7/19): Cr 1.97, K 4.4, Hgb 13.2, Hgb A1c 7.1. LFTs normal.  LDL 25 Labs (9/19): K 4.2, creatinine 1.96 Labs (12/19): TSH normal, digoxin 0.7, LFTs normal, LDL 24 Labs (1/20): hgb 13.4 Labs (4/20): LFTs normal, digoxin 0.7, TSH normal Labs (5/20): K 4.1, creatinine 1.91 Labs (11/21): K 4.2, creatinine 1.92, LDL 29, HDL 39, TSH normal, LFTs normal Labs (5/22): K 4.1, creatinine 2.18, TSH normal, LFTs normal, hgb 12.1  PMH: 1. CAD: S/p CABG 2005 with LIMA-Diagonal, SVG-LAD, SVG-LCx, SVG-RCA.  Cardiolite in 2/15 with EF 19%, no ischemia.  2. Ischemic cardiomyopathy: Echo (2/16) with EF 20-25%, severe LV dilation with RWMAs, moderate AI, moderate MR, moderately decreased RV systolic function.  St Jude CRT-D device.  - Echo 10/2016 LVEF 20-25% Grade 1 DD, Mild/Mod MR, mild LAE, Mildly reduced RV function, Mild RAE, Peak PA pressure 32 mmHg - Echo (7/20): EF 20-25%, LAD territory AK, normal RV size with mildly decreased systolic function, mild AI with moderate AS (mean gradient 24 mmHg).  - Echo (5/22): EF 20-25%, apical/peri-apical akinesis, mildly decreased RV systolic function, mild MR, mild AI, low gradient moderate AS with mean gradient 12 mmHg and  AVA 1.1 cm^2, IVC normal.  3. Atrial fibrillation: Paroxysmal.  DCCV 3/16.  4. CKD stage 3 5. Carotid stenosis: Carotid dopplers (1/16) with 60-79% RICA stenosis.  Carotids (1/17) with bilateral 40-59% ICA stenosis.  - Carotids (1/18): 40-59% BICA stenosis.  - Carotids (5/20): 40-59% RICA stenosis.  - Carotids (6/21): 40-59% RICA stenosis.  6. HTN 7. Hyperlipidemia 8. Restless leg syndrome. 9. Type 2 diabetes. 10. OA 11. Depression 12. H/o CCY 27. H/o appy 14. OSA: Uses CPAP.   15. ABIs (7/17): Normal 16. Melanoma: s/p excision. 17. Aortic stenosis: Moderate on 7/20 echo and on 5/22 echo.   18. Gout  Current Outpatient Medications  Medication Sig Dispense Refill  . ACETAMINOPHEN EXTRA STRENGTH 500 MG tablet 1 Add'l Sig oral Select Frequency    . allopurinol (ZYLOPRIM) 100 MG tablet TAKE 1 TABLET DAILY 30 tablet 11  . amiodarone (PACERONE) 200 MG tablet TAKE 1/2 TABLET (100MG TOTAL) DAILY 45 tablet 3  . apixaban (ELIQUIS) 2.5 MG TABS tablet Take 1 tablet (2.5 mg total) by mouth 2 (two) times daily. 270 tablet 3  . atorvastatin (LIPITOR) 40 MG tablet TAKE 1 TABLET DAILY 90 tablet 3  . Blood Glucose Monitoring Suppl (ACCU-CHEK AVIVA PLUS) w/Device KIT Check blood sugars daily up to three times daily or as needed. 1 kit prn  . carvedilol (COREG) 12.5 MG tablet TAKE 1 TABLET TWICE A DAY (CANCEL ALL PREVIOUS ORDERS FOR CURRENT MEDICATION. CHANGE IN DOSAGE OR TABLET SIZE) 180 tablet 3  . digoxin (LANOXIN) 0.125 MG tablet TAKE 1/2 TABLET BY MOUTH DAILY 45 tablet 3  . escitalopram (LEXAPRO) 10 MG tablet Take 1 tablet (10 mg total) by mouth daily. Please schedule follow up with Dr. Regis Bill for refills. [phone] Thank you! 90 tablet 1  . FARXIGA 10 MG TABS tablet TAKE 1 TABLET BY MOUTH DAILY BEFORE BREAKFAST 60 tablet 2  . glipiZIDE (GLUCOTROL) 10 MG tablet Take 1 tablet (10 mg total) by mouth daily before breakfast. Patient needs to schedule follow up visit with Dr. Regis Bill for refills. [phone] 90 tablet 1  . glucose blood (ACCU-CHEK AVIVA PLUS) test strip Check blood sugars up to three times daily as needed. Please schedule your follow up with labs with Dr. Regis Bill. [phone] 300 strip 0  . hydrALAZINE (APRESOLINE) 25 MG tablet Take 1.5 tablets (37.75 mg total) by mouth every 8 (eight) hours. 405 tablet 0  . insulin glargine (LANTUS) 100 unit/mL SOPN Inject 30 Units into the skin at bedtime.     . Insulin Pen Needle (BD PEN NEEDLE NANO U/F) 32G X 4 MM MISC [place] TO TEST  BLOOD SUGAR THREE TIMES DAILY 300 each 1  . isosorbide mononitrate (IMDUR) 60 MG 24 hr tablet TAKE 1 TABLET DAILY 90 tablet 3  . LEVEMIR FLEXTOUCH 100 UNIT/ML Pen 1 Add'l Sig subcutaneous Select Frequency    . lidocaine (LIDODERM) 5 % Place 1 patch onto the skin daily. Remove & Discard patch within 12 hours or as directed by MD 30 patch 3  . Multiple Vitamins-Minerals (PRESERVISION AREDS 2) CAPS Take 1 capsule by mouth 2 (two) times daily.     Glory Rosebush DELICA LANCETS FINE MISC Use to test blood sugar 2-3 times daily 300 each 1  . pramipexole (MIRAPEX) 0.5 MG tablet TAKE 1 TABLET 2 TO 3 HOURS BEFORE SLEEP FOR RESTLESS LEG 90 tablet 1  . Semaglutide (OZEMPIC) 0.25 or 0.5 MG/DOSE SOPN Inject 0.5 mg into the skin once a week. 1.5 mL 3  . silodosin (  RAPAFLO) 8 MG CAPS capsule TAKE 1 CAPSULE DAILY WITH BREAKFAST 90 capsule 1  . spironolactone (ALDACTONE) 25 MG tablet TAKE 1 TABLET DAILY 90 tablet 3  . torsemide (DEMADEX) 20 MG tablet Take 1 tablet (20 mg total) by mouth daily. 90 tablet 1  . UNABLE TO FIND Use to test blood sugar 2-3 times daily    . vitamin B-12 (CYANOCOBALAMIN) 1000 MCG tablet Take 1,000 mcg by mouth daily.     No current facility-administered medications for this encounter.    Allergies:   Patient has no known allergies.   Social History:  The patient  reports that he quit smoking about 42 years ago. His smoking use included cigarettes. He has a 40.00 pack-year smoking history. He has quit using smokeless tobacco.  His smokeless tobacco use included chew. He reports that he does not drink alcohol and does not use drugs.   Family History:  The patient's family history includes COPD in his father; Healthy in his daughter, daughter, and daughter.   ROS:  Please see the history of present illness.   All other systems are personally reviewed and negative.   Exam:   BP 102/60   Pulse 62   Wt 77.5 kg (170 lb 12.8 oz)   SpO2 98%   BMI 21.93 kg/m  General: NAD Neck: No JVD, no  thyromegaly or thyroid nodule.  Lungs: Clear to auscultation bilaterally with normal respiratory effort. CV: Nondisplaced PMI.  Heart regular S1/S2, no S3/S4, no murmur.  Trace ankle edema.  No carotid bruit.  Normal pedal pulses.  Abdomen: Soft, nontender, no hepatosplenomegaly, no distention.  Skin: Intact without lesions or rashes.  Neurologic: Alert and oriented x 3.  Psych: Normal affect. Extremities: No clubbing or cyanosis.  HEENT: Normal.   Recent Labs: 07/19/2020: ALT 13; BUN 55; Creatinine, Ser 2.18; Hemoglobin 12.1; Magnesium 2.1; Platelets 183.0; Potassium 4.1; Sodium 141; TSH 1.28  Personally reviewed   Wt Readings from Last 3 Encounters:  07/22/20 77.5 kg (170 lb 12.8 oz)  07/21/20 77.1 kg (170 lb)  07/19/20 77 kg (169 lb 12.8 oz)      ASSESSMENT AND PLAN:  1. Chronic systolic CHF: Ischemic cardiomyopathy.  St Jude CRT-D.  Eecho in 5/22 showed EF 20-25%, apical/peri-apical akinesis, mildly decreased RV systolic function, mild MR, mild AI, low gradient moderate AS with mean gradient 12 mmHg and AVA 1.1 cm^2, IVC normal.  NYHA class II symptoms.  He is >99% BiV paced on device interrogation today.  He is not volume overloaded on exam or by Corevue.  Weight is down 8 lbs.  - Continue torsemide 20 mg daily.  Creatinine stable on recent check.     - Continue Farxiga 10 mg daily.  - Continue Imdur 60 mg daily and hydralazine to 37.5 mg tid.  - He has been off ACEI/ARB/ARNI with elevated creatinine.  - Continue spironolactone 25 mg daily.   - Continue current digoxin, check level.   - Continue Coreg 12.5 mg bid.  2. Atrial fibrillation: Paroxysmal.  ICD interrogation today showed no atrial fibrillation.   - Continue amiodarone 100 mg daily.  LFTs/TSH normal recently.  He will need a regular eye exam.   - Continue Eliquis (dosed at 2.5 mg bid with age and renal dysfunction).   3. CKD: Stage 3, baseline now around 2.  4. CAD: No chest pain.  - Continue on statin, good lipids in  11/21.    - He is not on ASA 81 given stable CAD  and Eliquis use.   5. Carotid stenosis: [place] 6/21 with 40-59% RICA.  - Repeat carotid dopplers 6/22.  6. OSA: Continue nightly CPAP.   Followup 3 months with APP. Arrange for PT for balance training.   Signed, [name], MD  07/24/2020  [place] [address] [facility] [place] [address] [phone] (office) [phone] (fax)

## 2020-07-25 NOTE — Telephone Encounter (Signed)
This message is in the wrong patient chart please write this information in the patient's chart so I can address it.

## 2020-07-25 NOTE — Telephone Encounter (Signed)
Telephone note has been created for correct patient [name]

## 2020-07-25 NOTE — Telephone Encounter (Signed)
Medication list has been updated.

## 2020-07-25 NOTE — Telephone Encounter (Signed)
Patient is aware that rx was sent to [facility] on [address]. Patient had requested that prescription be sent to [facility] on [address]

## 2020-07-25 NOTE — Telephone Encounter (Signed)
Information has been updated in the correct patients chart.

## 2020-07-25 NOTE — Addendum Note (Signed)
Addended by: Nilda Riggs on: 07/25/2020 02:08 PM   Modules accepted: Orders

## 2020-07-27 ENCOUNTER — Telehealth: Payer: Self-pay

## 2020-07-27 NOTE — Addendum Note (Signed)
Addended by: Nilda Riggs on: 07/27/2020 09:27 AM   Modules accepted: Orders

## 2020-07-27 NOTE — Telephone Encounter (Signed)
Home health referral has been placed and patient has been notified that someone should be reaching out to him for more information.

## 2020-07-27 NOTE — Telephone Encounter (Signed)
-----   Message from Burnis Medin, MD sent at 07/19/2020  7:22 PM EDT ----- Please order home health physical therapy for leg weakness and fatigue thanks [facility]

## 2020-07-27 NOTE — Progress Notes (Signed)
He could take  maybe 3 x per week

## 2020-07-28 DIAGNOSIS — C44629 Squamous cell carcinoma of skin of left upper limb, including shoulder: Secondary | ICD-10-CM | POA: Diagnosis not present

## 2020-08-02 ENCOUNTER — Ambulatory Visit (INDEPENDENT_AMBULATORY_CARE_PROVIDER_SITE_OTHER): Payer: Medicare Other

## 2020-08-02 DIAGNOSIS — I5043 Acute on chronic combined systolic (congestive) and diastolic (congestive) heart failure: Secondary | ICD-10-CM | POA: Diagnosis not present

## 2020-08-02 DIAGNOSIS — G4733 Obstructive sleep apnea (adult) (pediatric): Secondary | ICD-10-CM | POA: Diagnosis not present

## 2020-08-02 DIAGNOSIS — E1122 Type 2 diabetes mellitus with diabetic chronic kidney disease: Secondary | ICD-10-CM | POA: Diagnosis not present

## 2020-08-02 DIAGNOSIS — E785 Hyperlipidemia, unspecified: Secondary | ICD-10-CM | POA: Diagnosis not present

## 2020-08-02 DIAGNOSIS — E876 Hypokalemia: Secondary | ICD-10-CM | POA: Diagnosis not present

## 2020-08-02 DIAGNOSIS — I2510 Atherosclerotic heart disease of native coronary artery without angina pectoris: Secondary | ICD-10-CM | POA: Diagnosis not present

## 2020-08-02 DIAGNOSIS — D631 Anemia in chronic kidney disease: Secondary | ICD-10-CM | POA: Diagnosis not present

## 2020-08-02 DIAGNOSIS — E538 Deficiency of other specified B group vitamins: Secondary | ICD-10-CM | POA: Diagnosis not present

## 2020-08-02 DIAGNOSIS — N1832 Chronic kidney disease, stage 3b: Secondary | ICD-10-CM | POA: Diagnosis not present

## 2020-08-02 DIAGNOSIS — D72829 Elevated white blood cell count, unspecified: Secondary | ICD-10-CM | POA: Diagnosis not present

## 2020-08-02 DIAGNOSIS — I255 Ischemic cardiomyopathy: Secondary | ICD-10-CM

## 2020-08-02 DIAGNOSIS — I130 Hypertensive heart and chronic kidney disease with heart failure and stage 1 through stage 4 chronic kidney disease, or unspecified chronic kidney disease: Secondary | ICD-10-CM | POA: Diagnosis not present

## 2020-08-02 DIAGNOSIS — F32A Depression, unspecified: Secondary | ICD-10-CM | POA: Diagnosis not present

## 2020-08-02 DIAGNOSIS — D509 Iron deficiency anemia, unspecified: Secondary | ICD-10-CM | POA: Diagnosis not present

## 2020-08-02 DIAGNOSIS — H9202 Otalgia, left ear: Secondary | ICD-10-CM | POA: Diagnosis not present

## 2020-08-02 DIAGNOSIS — E039 Hypothyroidism, unspecified: Secondary | ICD-10-CM | POA: Diagnosis not present

## 2020-08-02 DIAGNOSIS — C44629 Squamous cell carcinoma of skin of left upper limb, including shoulder: Secondary | ICD-10-CM | POA: Diagnosis not present

## 2020-08-02 DIAGNOSIS — M1A3710 Chronic gout due to renal impairment, right ankle and foot, without tophus (tophi): Secondary | ICD-10-CM | POA: Diagnosis not present

## 2020-08-02 DIAGNOSIS — H353130 Nonexudative age-related macular degeneration, bilateral, stage unspecified: Secondary | ICD-10-CM | POA: Diagnosis not present

## 2020-08-02 DIAGNOSIS — G2581 Restless legs syndrome: Secondary | ICD-10-CM | POA: Diagnosis not present

## 2020-08-02 DIAGNOSIS — E1140 Type 2 diabetes mellitus with diabetic neuropathy, unspecified: Secondary | ICD-10-CM | POA: Diagnosis not present

## 2020-08-02 DIAGNOSIS — E1151 Type 2 diabetes mellitus with diabetic peripheral angiopathy without gangrene: Secondary | ICD-10-CM | POA: Diagnosis not present

## 2020-08-02 DIAGNOSIS — C44622 Squamous cell carcinoma of skin of right upper limb, including shoulder: Secondary | ICD-10-CM | POA: Diagnosis not present

## 2020-08-02 DIAGNOSIS — H9193 Unspecified hearing loss, bilateral: Secondary | ICD-10-CM | POA: Diagnosis not present

## 2020-08-02 DIAGNOSIS — C430 Malignant melanoma of lip: Secondary | ICD-10-CM | POA: Diagnosis not present

## 2020-08-02 DIAGNOSIS — D591 Autoimmune hemolytic anemia, unspecified: Secondary | ICD-10-CM | POA: Diagnosis not present

## 2020-08-02 LAB — CUP PACEART REMOTE DEVICE CHECK
Battery Remaining Longevity: 67 mo
Battery Remaining Percentage: 95.5 %
Battery Voltage: 2.98 V
Brady Statistic AP VP Percent: 97 %
Brady Statistic AP VS Percent: 1 %
Brady Statistic AS VP Percent: 3.2 %
Brady Statistic AS VS Percent: 1 %
Brady Statistic RA Percent Paced: 96 %
Date Time Interrogation Session: 20220516040016
Implantable Lead Implant Date: 20051221
Implantable Lead Implant Date: 20150304
Implantable Lead Implant Date: 20150304
Implantable Lead Location: 753858
Implantable Lead Location: 753859
Implantable Lead Location: 753860
Implantable Lead Model: 1581
Implantable Pulse Generator Implant Date: 20200810
Lead Channel Impedance Value: 380 Ohm
Lead Channel Impedance Value: 410 Ohm
Lead Channel Impedance Value: 430 Ohm
Lead Channel Pacing Threshold Amplitude: 0.75 V
Lead Channel Pacing Threshold Amplitude: 1.25 V
Lead Channel Pacing Threshold Amplitude: 2.25 V
Lead Channel Pacing Threshold Pulse Width: 0.5 ms
Lead Channel Pacing Threshold Pulse Width: 0.5 ms
Lead Channel Pacing Threshold Pulse Width: 1.2 ms
Lead Channel Sensing Intrinsic Amplitude: 1.8 mV
Lead Channel Sensing Intrinsic Amplitude: 12 mV
Lead Channel Setting Pacing Amplitude: 1.5 V
Lead Channel Setting Pacing Amplitude: 2.5 V
Lead Channel Setting Pacing Amplitude: 2.5 V
Lead Channel Setting Pacing Pulse Width: 0.5 ms
Lead Channel Setting Pacing Pulse Width: 1.2 ms
Lead Channel Setting Sensing Sensitivity: 2 mV
Pulse Gen Model: 3562
Pulse Gen Serial Number: 9129173

## 2020-08-04 DIAGNOSIS — T8141XA Infection following a procedure, superficial incisional surgical site, initial encounter: Secondary | ICD-10-CM | POA: Diagnosis not present

## 2020-08-09 DIAGNOSIS — I5043 Acute on chronic combined systolic (congestive) and diastolic (congestive) heart failure: Secondary | ICD-10-CM | POA: Diagnosis not present

## 2020-08-09 DIAGNOSIS — M1A3710 Chronic gout due to renal impairment, right ankle and foot, without tophus (tophi): Secondary | ICD-10-CM | POA: Diagnosis not present

## 2020-08-09 DIAGNOSIS — D631 Anemia in chronic kidney disease: Secondary | ICD-10-CM | POA: Diagnosis not present

## 2020-08-09 DIAGNOSIS — E1122 Type 2 diabetes mellitus with diabetic chronic kidney disease: Secondary | ICD-10-CM | POA: Diagnosis not present

## 2020-08-09 DIAGNOSIS — I130 Hypertensive heart and chronic kidney disease with heart failure and stage 1 through stage 4 chronic kidney disease, or unspecified chronic kidney disease: Secondary | ICD-10-CM | POA: Diagnosis not present

## 2020-08-09 DIAGNOSIS — N1832 Chronic kidney disease, stage 3b: Secondary | ICD-10-CM | POA: Diagnosis not present

## 2020-08-12 DIAGNOSIS — D631 Anemia in chronic kidney disease: Secondary | ICD-10-CM | POA: Diagnosis not present

## 2020-08-12 DIAGNOSIS — E1122 Type 2 diabetes mellitus with diabetic chronic kidney disease: Secondary | ICD-10-CM | POA: Diagnosis not present

## 2020-08-12 DIAGNOSIS — M1A3710 Chronic gout due to renal impairment, right ankle and foot, without tophus (tophi): Secondary | ICD-10-CM | POA: Diagnosis not present

## 2020-08-12 DIAGNOSIS — N1832 Chronic kidney disease, stage 3b: Secondary | ICD-10-CM | POA: Diagnosis not present

## 2020-08-12 DIAGNOSIS — I5043 Acute on chronic combined systolic (congestive) and diastolic (congestive) heart failure: Secondary | ICD-10-CM | POA: Diagnosis not present

## 2020-08-12 DIAGNOSIS — I130 Hypertensive heart and chronic kidney disease with heart failure and stage 1 through stage 4 chronic kidney disease, or unspecified chronic kidney disease: Secondary | ICD-10-CM | POA: Diagnosis not present

## 2020-08-16 DIAGNOSIS — I5043 Acute on chronic combined systolic (congestive) and diastolic (congestive) heart failure: Secondary | ICD-10-CM | POA: Diagnosis not present

## 2020-08-16 DIAGNOSIS — I130 Hypertensive heart and chronic kidney disease with heart failure and stage 1 through stage 4 chronic kidney disease, or unspecified chronic kidney disease: Secondary | ICD-10-CM | POA: Diagnosis not present

## 2020-08-16 DIAGNOSIS — E1122 Type 2 diabetes mellitus with diabetic chronic kidney disease: Secondary | ICD-10-CM | POA: Diagnosis not present

## 2020-08-16 DIAGNOSIS — M1A3710 Chronic gout due to renal impairment, right ankle and foot, without tophus (tophi): Secondary | ICD-10-CM | POA: Diagnosis not present

## 2020-08-16 DIAGNOSIS — D631 Anemia in chronic kidney disease: Secondary | ICD-10-CM | POA: Diagnosis not present

## 2020-08-16 DIAGNOSIS — N1832 Chronic kidney disease, stage 3b: Secondary | ICD-10-CM | POA: Diagnosis not present

## 2020-08-17 ENCOUNTER — Other Ambulatory Visit (HOSPITAL_COMMUNITY): Payer: Self-pay | Admitting: Cardiology

## 2020-08-17 ENCOUNTER — Other Ambulatory Visit: Payer: Self-pay

## 2020-08-17 ENCOUNTER — Ambulatory Visit (HOSPITAL_COMMUNITY)
Admission: RE | Admit: 2020-08-17 | Discharge: 2020-08-17 | Disposition: A | Discharge status: Home / Self Care | Payer: Medicare Other | Source: Ambulatory Visit | Attending: Cardiovascular Disease | Admitting: Cardiovascular Disease

## 2020-08-17 DIAGNOSIS — I6523 Occlusion and stenosis of bilateral carotid arteries: Secondary | ICD-10-CM

## 2020-08-18 DIAGNOSIS — N1832 Chronic kidney disease, stage 3b: Secondary | ICD-10-CM | POA: Diagnosis not present

## 2020-08-18 DIAGNOSIS — E1122 Type 2 diabetes mellitus with diabetic chronic kidney disease: Secondary | ICD-10-CM | POA: Diagnosis not present

## 2020-08-18 DIAGNOSIS — I5043 Acute on chronic combined systolic (congestive) and diastolic (congestive) heart failure: Secondary | ICD-10-CM | POA: Diagnosis not present

## 2020-08-18 DIAGNOSIS — I130 Hypertensive heart and chronic kidney disease with heart failure and stage 1 through stage 4 chronic kidney disease, or unspecified chronic kidney disease: Secondary | ICD-10-CM | POA: Diagnosis not present

## 2020-08-18 DIAGNOSIS — D631 Anemia in chronic kidney disease: Secondary | ICD-10-CM | POA: Diagnosis not present

## 2020-08-18 DIAGNOSIS — M1A3710 Chronic gout due to renal impairment, right ankle and foot, without tophus (tophi): Secondary | ICD-10-CM | POA: Diagnosis not present

## 2020-08-24 DIAGNOSIS — D631 Anemia in chronic kidney disease: Secondary | ICD-10-CM | POA: Diagnosis not present

## 2020-08-24 DIAGNOSIS — N1832 Chronic kidney disease, stage 3b: Secondary | ICD-10-CM | POA: Diagnosis not present

## 2020-08-24 DIAGNOSIS — I5043 Acute on chronic combined systolic (congestive) and diastolic (congestive) heart failure: Secondary | ICD-10-CM | POA: Diagnosis not present

## 2020-08-24 DIAGNOSIS — E1122 Type 2 diabetes mellitus with diabetic chronic kidney disease: Secondary | ICD-10-CM | POA: Diagnosis not present

## 2020-08-24 DIAGNOSIS — M1A3710 Chronic gout due to renal impairment, right ankle and foot, without tophus (tophi): Secondary | ICD-10-CM | POA: Diagnosis not present

## 2020-08-24 DIAGNOSIS — I130 Hypertensive heart and chronic kidney disease with heart failure and stage 1 through stage 4 chronic kidney disease, or unspecified chronic kidney disease: Secondary | ICD-10-CM | POA: Diagnosis not present

## 2020-08-24 NOTE — Progress Notes (Signed)
Remote pacemaker transmission.   

## 2020-08-26 ENCOUNTER — Ambulatory Visit: Payer: Self-pay

## 2020-08-26 ENCOUNTER — Other Ambulatory Visit: Payer: Self-pay

## 2020-08-26 ENCOUNTER — Ambulatory Visit (INDEPENDENT_AMBULATORY_CARE_PROVIDER_SITE_OTHER): Payer: Medicare Other | Admitting: Family Medicine

## 2020-08-26 DIAGNOSIS — I130 Hypertensive heart and chronic kidney disease with heart failure and stage 1 through stage 4 chronic kidney disease, or unspecified chronic kidney disease: Secondary | ICD-10-CM | POA: Diagnosis not present

## 2020-08-26 DIAGNOSIS — I5043 Acute on chronic combined systolic (congestive) and diastolic (congestive) heart failure: Secondary | ICD-10-CM | POA: Diagnosis not present

## 2020-08-26 DIAGNOSIS — M1A3710 Chronic gout due to renal impairment, right ankle and foot, without tophus (tophi): Secondary | ICD-10-CM | POA: Diagnosis not present

## 2020-08-26 DIAGNOSIS — M25511 Pain in right shoulder: Secondary | ICD-10-CM

## 2020-08-26 DIAGNOSIS — M25551 Pain in right hip: Secondary | ICD-10-CM

## 2020-08-26 DIAGNOSIS — D631 Anemia in chronic kidney disease: Secondary | ICD-10-CM | POA: Diagnosis not present

## 2020-08-26 DIAGNOSIS — E1122 Type 2 diabetes mellitus with diabetic chronic kidney disease: Secondary | ICD-10-CM | POA: Diagnosis not present

## 2020-08-26 DIAGNOSIS — G8929 Other chronic pain: Secondary | ICD-10-CM | POA: Diagnosis not present

## 2020-08-26 DIAGNOSIS — N1832 Chronic kidney disease, stage 3b: Secondary | ICD-10-CM | POA: Diagnosis not present
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
## 2020-08-26 NOTE — Patient Instructions (Signed)
     Voltaren Gel:  Try for hip and/or shoulder, 2-4 times daily

## 2020-08-31 DIAGNOSIS — I130 Hypertensive heart and chronic kidney disease with heart failure and stage 1 through stage 4 chronic kidney disease, or unspecified chronic kidney disease: Secondary | ICD-10-CM | POA: Diagnosis not present

## 2020-08-31 DIAGNOSIS — I5043 Acute on chronic combined systolic (congestive) and diastolic (congestive) heart failure: Secondary | ICD-10-CM | POA: Diagnosis not present

## 2020-08-31 DIAGNOSIS — M1A3710 Chronic gout due to renal impairment, right ankle and foot, without tophus (tophi): Secondary | ICD-10-CM | POA: Diagnosis not present

## 2020-08-31 DIAGNOSIS — N1832 Chronic kidney disease, stage 3b: Secondary | ICD-10-CM | POA: Diagnosis not present

## 2020-08-31 DIAGNOSIS — E1122 Type 2 diabetes mellitus with diabetic chronic kidney disease: Secondary | ICD-10-CM | POA: Diagnosis not present

## 2020-08-31 DIAGNOSIS — D631 Anemia in chronic kidney disease: Secondary | ICD-10-CM | POA: Diagnosis not present

## 2020-09-01 DIAGNOSIS — G4733 Obstructive sleep apnea (adult) (pediatric): Secondary | ICD-10-CM | POA: Diagnosis not present

## 2020-09-01 DIAGNOSIS — I5043 Acute on chronic combined systolic (congestive) and diastolic (congestive) heart failure: Secondary | ICD-10-CM | POA: Diagnosis not present

## 2020-09-01 DIAGNOSIS — D591 Autoimmune hemolytic anemia, unspecified: Secondary | ICD-10-CM | POA: Diagnosis not present

## 2020-09-01 DIAGNOSIS — E1151 Type 2 diabetes mellitus with diabetic peripheral angiopathy without gangrene: Secondary | ICD-10-CM | POA: Diagnosis not present

## 2020-09-01 DIAGNOSIS — E039 Hypothyroidism, unspecified: Secondary | ICD-10-CM | POA: Diagnosis not present

## 2020-09-01 DIAGNOSIS — M1A3710 Chronic gout due to renal impairment, right ankle and foot, without tophus (tophi): Secondary | ICD-10-CM | POA: Diagnosis not present

## 2020-09-01 DIAGNOSIS — E876 Hypokalemia: Secondary | ICD-10-CM | POA: Diagnosis not present

## 2020-09-01 DIAGNOSIS — I130 Hypertensive heart and chronic kidney disease with heart failure and stage 1 through stage 4 chronic kidney disease, or unspecified chronic kidney disease: Secondary | ICD-10-CM | POA: Diagnosis not present

## 2020-09-01 DIAGNOSIS — E1122 Type 2 diabetes mellitus with diabetic chronic kidney disease: Secondary | ICD-10-CM | POA: Diagnosis not present

## 2020-09-01 DIAGNOSIS — D631 Anemia in chronic kidney disease: Secondary | ICD-10-CM | POA: Diagnosis not present

## 2020-09-01 DIAGNOSIS — H9193 Unspecified hearing loss, bilateral: Secondary | ICD-10-CM | POA: Diagnosis not present

## 2020-09-01 DIAGNOSIS — C44629 Squamous cell carcinoma of skin of left upper limb, including shoulder: Secondary | ICD-10-CM | POA: Diagnosis not present

## 2020-09-01 DIAGNOSIS — F32A Depression, unspecified: Secondary | ICD-10-CM | POA: Diagnosis not present

## 2020-09-01 DIAGNOSIS — I2510 Atherosclerotic heart disease of native coronary artery without angina pectoris: Secondary | ICD-10-CM | POA: Diagnosis not present

## 2020-09-01 DIAGNOSIS — H353130 Nonexudative age-related macular degeneration, bilateral, stage unspecified: Secondary | ICD-10-CM | POA: Diagnosis not present

## 2020-09-01 DIAGNOSIS — C44622 Squamous cell carcinoma of skin of right upper limb, including shoulder: Secondary | ICD-10-CM | POA: Diagnosis not present

## 2020-09-01 DIAGNOSIS — D72829 Elevated white blood cell count, unspecified: Secondary | ICD-10-CM | POA: Diagnosis not present

## 2020-09-01 DIAGNOSIS — E1140 Type 2 diabetes mellitus with diabetic neuropathy, unspecified: Secondary | ICD-10-CM | POA: Diagnosis not present

## 2020-09-01 DIAGNOSIS — E538 Deficiency of other specified B group vitamins: Secondary | ICD-10-CM | POA: Diagnosis not present

## 2020-09-01 DIAGNOSIS — C430 Malignant melanoma of lip: Secondary | ICD-10-CM | POA: Diagnosis not present

## 2020-09-01 DIAGNOSIS — G2581 Restless legs syndrome: Secondary | ICD-10-CM | POA: Diagnosis not present

## 2020-09-01 DIAGNOSIS — N1832 Chronic kidney disease, stage 3b: Secondary | ICD-10-CM | POA: Diagnosis not present

## 2020-09-01 DIAGNOSIS — E785 Hyperlipidemia, unspecified: Secondary | ICD-10-CM | POA: Diagnosis not present

## 2020-09-01 DIAGNOSIS — H9202 Otalgia, left ear: Secondary | ICD-10-CM | POA: Diagnosis not present

## 2020-09-01 DIAGNOSIS — D509 Iron deficiency anemia, unspecified: Secondary | ICD-10-CM | POA: Diagnosis not present

## 2020-09-07 ENCOUNTER — Telehealth: Payer: Self-pay | Admitting: Internal Medicine

## 2020-09-07 DIAGNOSIS — I130 Hypertensive heart and chronic kidney disease with heart failure and stage 1 through stage 4 chronic kidney disease, or unspecified chronic kidney disease: Secondary | ICD-10-CM | POA: Diagnosis not present

## 2020-09-07 DIAGNOSIS — D631 Anemia in chronic kidney disease: Secondary | ICD-10-CM | POA: Diagnosis not present

## 2020-09-07 DIAGNOSIS — N1832 Chronic kidney disease, stage 3b: Secondary | ICD-10-CM | POA: Diagnosis not present

## 2020-09-07 DIAGNOSIS — E1122 Type 2 diabetes mellitus with diabetic chronic kidney disease: Secondary | ICD-10-CM | POA: Diagnosis not present

## 2020-09-07 DIAGNOSIS — I5043 Acute on chronic combined systolic (congestive) and diastolic (congestive) heart failure: Secondary | ICD-10-CM | POA: Diagnosis not present

## 2020-09-07 DIAGNOSIS — M1A3710 Chronic gout due to renal impairment, right ankle and foot, without tophus (tophi): Secondary | ICD-10-CM | POA: Diagnosis not present

## 2020-09-07 NOTE — Telephone Encounter (Signed)
[name] from [facility] needs verbal orders for PT  The patient fell last week and wasn't hurt  Frequency  2 times a week for 2 weeks  1 time a week for 2 weeks  [place]

## 2020-09-08 NOTE — Telephone Encounter (Signed)
Approve the verbal orders requested .

## 2020-09-08 NOTE — Telephone Encounter (Signed)
Left a message for [name] to return my call.

## 2020-09-09 DIAGNOSIS — I130 Hypertensive heart and chronic kidney disease with heart failure and stage 1 through stage 4 chronic kidney disease, or unspecified chronic kidney disease: Secondary | ICD-10-CM | POA: Diagnosis not present

## 2020-09-09 DIAGNOSIS — N1832 Chronic kidney disease, stage 3b: Secondary | ICD-10-CM | POA: Diagnosis not present

## 2020-09-09 DIAGNOSIS — E1122 Type 2 diabetes mellitus with diabetic chronic kidney disease: Secondary | ICD-10-CM | POA: Diagnosis not present

## 2020-09-09 DIAGNOSIS — I5043 Acute on chronic combined systolic (congestive) and diastolic (congestive) heart failure: Secondary | ICD-10-CM | POA: Diagnosis not present

## 2020-09-09 DIAGNOSIS — M1A3710 Chronic gout due to renal impairment, right ankle and foot, without tophus (tophi): Secondary | ICD-10-CM | POA: Diagnosis not present

## 2020-09-09 DIAGNOSIS — D631 Anemia in chronic kidney disease: Secondary | ICD-10-CM | POA: Diagnosis not present

## 2020-09-09 NOTE — Telephone Encounter (Signed)
Left a message for [name] to return my call.

## 2020-09-09 NOTE — Telephone Encounter (Signed)
Verbal orders given to [facility]

## 2020-09-12 ENCOUNTER — Telehealth (HOSPITAL_COMMUNITY): Payer: Self-pay

## 2020-09-12 NOTE — Telephone Encounter (Signed)
Patients wife reports he has not missed any does

## 2020-09-12 NOTE — Telephone Encounter (Signed)
Patients wife called to report that patients feet are swollen, denies discoloration, numbness or tingling. patients weight  today was 163

## 2020-09-12 NOTE — Telephone Encounter (Signed)
Patients wife advised and verbalized understanding.  

## 2020-09-15 DIAGNOSIS — D631 Anemia in chronic kidney disease: Secondary | ICD-10-CM | POA: Diagnosis not present

## 2020-09-15 DIAGNOSIS — M1A3710 Chronic gout due to renal impairment, right ankle and foot, without tophus (tophi): Secondary | ICD-10-CM | POA: Diagnosis not present

## 2020-09-15 DIAGNOSIS — I130 Hypertensive heart and chronic kidney disease with heart failure and stage 1 through stage 4 chronic kidney disease, or unspecified chronic kidney disease: Secondary | ICD-10-CM | POA: Diagnosis not present

## 2020-09-15 DIAGNOSIS — N1832 Chronic kidney disease, stage 3b: Secondary | ICD-10-CM | POA: Diagnosis not present

## 2020-09-15 DIAGNOSIS — E1122 Type 2 diabetes mellitus with diabetic chronic kidney disease: Secondary | ICD-10-CM | POA: Diagnosis not present

## 2020-09-15 DIAGNOSIS — I5043 Acute on chronic combined systolic (congestive) and diastolic (congestive) heart failure: Secondary | ICD-10-CM | POA: Diagnosis not present

## 2020-09-15 DIAGNOSIS — D485 Neoplasm of uncertain behavior of skin: Secondary | ICD-10-CM | POA: Diagnosis not present

## 2020-09-15 DIAGNOSIS — C4449 Other specified malignant neoplasm of skin of scalp and neck: Secondary | ICD-10-CM | POA: Diagnosis not present

## 2020-09-16 DIAGNOSIS — D631 Anemia in chronic kidney disease: Secondary | ICD-10-CM | POA: Diagnosis not present

## 2020-09-16 DIAGNOSIS — I130 Hypertensive heart and chronic kidney disease with heart failure and stage 1 through stage 4 chronic kidney disease, or unspecified chronic kidney disease: Secondary | ICD-10-CM | POA: Diagnosis not present

## 2020-09-16 DIAGNOSIS — I5043 Acute on chronic combined systolic (congestive) and diastolic (congestive) heart failure: Secondary | ICD-10-CM | POA: Diagnosis not present

## 2020-09-16 DIAGNOSIS — N1832 Chronic kidney disease, stage 3b: Secondary | ICD-10-CM | POA: Diagnosis not present

## 2020-09-16 DIAGNOSIS — E1122 Type 2 diabetes mellitus with diabetic chronic kidney disease: Secondary | ICD-10-CM | POA: Diagnosis not present

## 2020-09-16 DIAGNOSIS — M1A3710 Chronic gout due to renal impairment, right ankle and foot, without tophus (tophi): Secondary | ICD-10-CM | POA: Diagnosis not present

## 2020-09-21 DIAGNOSIS — I5043 Acute on chronic combined systolic (congestive) and diastolic (congestive) heart failure: Secondary | ICD-10-CM | POA: Diagnosis not present

## 2020-09-21 DIAGNOSIS — N1832 Chronic kidney disease, stage 3b: Secondary | ICD-10-CM | POA: Diagnosis not present

## 2020-09-21 DIAGNOSIS — M1A3710 Chronic gout due to renal impairment, right ankle and foot, without tophus (tophi): Secondary | ICD-10-CM | POA: Diagnosis not present

## 2020-09-21 DIAGNOSIS — Z23 Encounter for immunization: Secondary | ICD-10-CM | POA: Diagnosis not present

## 2020-09-21 DIAGNOSIS — I130 Hypertensive heart and chronic kidney disease with heart failure and stage 1 through stage 4 chronic kidney disease, or unspecified chronic kidney disease: Secondary | ICD-10-CM | POA: Diagnosis not present

## 2020-09-21 DIAGNOSIS — E1122 Type 2 diabetes mellitus with diabetic chronic kidney disease: Secondary | ICD-10-CM | POA: Diagnosis not present

## 2020-09-21 DIAGNOSIS — D631 Anemia in chronic kidney disease: Secondary | ICD-10-CM | POA: Diagnosis not present

## 2020-09-26 ENCOUNTER — Telehealth: Payer: Self-pay | Admitting: Internal Medicine

## 2020-09-26 DIAGNOSIS — N1832 Chronic kidney disease, stage 3b: Secondary | ICD-10-CM | POA: Diagnosis not present

## 2020-09-26 DIAGNOSIS — R29898 Other symptoms and signs involving the musculoskeletal system: Secondary | ICD-10-CM

## 2020-09-26 DIAGNOSIS — I5043 Acute on chronic combined systolic (congestive) and diastolic (congestive) heart failure: Secondary | ICD-10-CM | POA: Diagnosis not present

## 2020-09-26 DIAGNOSIS — D631 Anemia in chronic kidney disease: Secondary | ICD-10-CM | POA: Diagnosis not present

## 2020-09-26 DIAGNOSIS — E1122 Type 2 diabetes mellitus with diabetic chronic kidney disease: Secondary | ICD-10-CM | POA: Diagnosis not present

## 2020-09-26 DIAGNOSIS — M1A3710 Chronic gout due to renal impairment, right ankle and foot, without tophus (tophi): Secondary | ICD-10-CM | POA: Diagnosis not present

## 2020-09-26 DIAGNOSIS — I130 Hypertensive heart and chronic kidney disease with heart failure and stage 1 through stage 4 chronic kidney disease, or unspecified chronic kidney disease: Secondary | ICD-10-CM | POA: Diagnosis not present

## 2020-09-26 NOTE — Telephone Encounter (Signed)
Physical therapy wants a prescription for a 4 wheel walker with a seat sent to the [facility] in [place].

## 2020-09-26 NOTE — Telephone Encounter (Signed)
[name] from [facility] is needing verbal orders to extend PT for   2 times a week for 3 weeks 1 time a week for 4 weeks  [place]

## 2020-09-26 NOTE — Telephone Encounter (Signed)
Orders approved.

## 2020-09-26 NOTE — Telephone Encounter (Signed)
Ok to do

## 2020-09-27 NOTE — Telephone Encounter (Signed)
Left a message for Colletta [place] to return my call.

## 2020-09-28 NOTE — Telephone Encounter (Signed)
Left a message for stephanie to return my call.

## 2020-09-28 NOTE — Telephone Encounter (Signed)
Orders have been faxed to Dr. Domenica Fail with [facility] in [place]. Fax [phone]

## 2020-09-28 NOTE — Telephone Encounter (Signed)
[name] from [facility] home health given verbal orders.

## 2020-09-29 DIAGNOSIS — C4442 Squamous cell carcinoma of skin of scalp and neck: Secondary | ICD-10-CM | POA: Diagnosis not present

## 2020-10-01 DIAGNOSIS — G4733 Obstructive sleep apnea (adult) (pediatric): Secondary | ICD-10-CM | POA: Diagnosis not present

## 2020-10-01 DIAGNOSIS — I130 Hypertensive heart and chronic kidney disease with heart failure and stage 1 through stage 4 chronic kidney disease, or unspecified chronic kidney disease: Secondary | ICD-10-CM | POA: Diagnosis not present

## 2020-10-01 DIAGNOSIS — Z794 Long term (current) use of insulin: Secondary | ICD-10-CM | POA: Diagnosis not present

## 2020-10-01 DIAGNOSIS — N1832 Chronic kidney disease, stage 3b: Secondary | ICD-10-CM | POA: Diagnosis not present

## 2020-10-01 DIAGNOSIS — I5043 Acute on chronic combined systolic (congestive) and diastolic (congestive) heart failure: Secondary | ICD-10-CM | POA: Diagnosis not present

## 2020-10-01 DIAGNOSIS — Z85820 Personal history of malignant melanoma of skin: Secondary | ICD-10-CM | POA: Diagnosis not present

## 2020-10-01 DIAGNOSIS — M1A3710 Chronic gout due to renal impairment, right ankle and foot, without tophus (tophi): Secondary | ICD-10-CM | POA: Diagnosis not present

## 2020-10-01 DIAGNOSIS — D509 Iron deficiency anemia, unspecified: Secondary | ICD-10-CM | POA: Diagnosis not present

## 2020-10-01 DIAGNOSIS — E538 Deficiency of other specified B group vitamins: Secondary | ICD-10-CM | POA: Diagnosis not present

## 2020-10-01 DIAGNOSIS — F32A Depression, unspecified: Secondary | ICD-10-CM | POA: Diagnosis not present

## 2020-10-01 DIAGNOSIS — E1122 Type 2 diabetes mellitus with diabetic chronic kidney disease: Secondary | ICD-10-CM | POA: Diagnosis not present

## 2020-10-01 DIAGNOSIS — Z7901 Long term (current) use of anticoagulants: Secondary | ICD-10-CM | POA: Diagnosis not present

## 2020-10-01 DIAGNOSIS — E1151 Type 2 diabetes mellitus with diabetic peripheral angiopathy without gangrene: Secondary | ICD-10-CM | POA: Diagnosis not present

## 2020-10-01 DIAGNOSIS — E039 Hypothyroidism, unspecified: Secondary | ICD-10-CM | POA: Diagnosis not present

## 2020-10-01 DIAGNOSIS — H9202 Otalgia, left ear: Secondary | ICD-10-CM | POA: Diagnosis not present

## 2020-10-01 DIAGNOSIS — I2510 Atherosclerotic heart disease of native coronary artery without angina pectoris: Secondary | ICD-10-CM | POA: Diagnosis not present

## 2020-10-01 DIAGNOSIS — Z7984 Long term (current) use of oral hypoglycemic drugs: Secondary | ICD-10-CM | POA: Diagnosis not present

## 2020-10-01 DIAGNOSIS — E785 Hyperlipidemia, unspecified: Secondary | ICD-10-CM | POA: Diagnosis not present

## 2020-10-01 DIAGNOSIS — H353130 Nonexudative age-related macular degeneration, bilateral, stage unspecified: Secondary | ICD-10-CM | POA: Diagnosis not present

## 2020-10-01 DIAGNOSIS — H9193 Unspecified hearing loss, bilateral: Secondary | ICD-10-CM | POA: Diagnosis not present

## 2020-10-01 DIAGNOSIS — D631 Anemia in chronic kidney disease: Secondary | ICD-10-CM | POA: Diagnosis not present

## 2020-10-01 DIAGNOSIS — G2581 Restless legs syndrome: Secondary | ICD-10-CM | POA: Diagnosis not present

## 2020-10-01 DIAGNOSIS — D591 Autoimmune hemolytic anemia, unspecified: Secondary | ICD-10-CM | POA: Diagnosis not present

## 2020-10-01 DIAGNOSIS — E1140 Type 2 diabetes mellitus with diabetic neuropathy, unspecified: Secondary | ICD-10-CM | POA: Diagnosis not present

## 2020-10-06 DIAGNOSIS — D631 Anemia in chronic kidney disease: Secondary | ICD-10-CM | POA: Diagnosis not present

## 2020-10-06 DIAGNOSIS — E1122 Type 2 diabetes mellitus with diabetic chronic kidney disease: Secondary | ICD-10-CM | POA: Diagnosis not present

## 2020-10-06 DIAGNOSIS — I5043 Acute on chronic combined systolic (congestive) and diastolic (congestive) heart failure: Secondary | ICD-10-CM | POA: Diagnosis not present

## 2020-10-06 DIAGNOSIS — M1A3710 Chronic gout due to renal impairment, right ankle and foot, without tophus (tophi): Secondary | ICD-10-CM | POA: Diagnosis not present

## 2020-10-06 DIAGNOSIS — N1832 Chronic kidney disease, stage 3b: Secondary | ICD-10-CM | POA: Diagnosis not present

## 2020-10-06 DIAGNOSIS — I130 Hypertensive heart and chronic kidney disease with heart failure and stage 1 through stage 4 chronic kidney disease, or unspecified chronic kidney disease: Secondary | ICD-10-CM | POA: Diagnosis not present

## 2020-10-10 DIAGNOSIS — M1A3710 Chronic gout due to renal impairment, right ankle and foot, without tophus (tophi): Secondary | ICD-10-CM | POA: Diagnosis not present

## 2020-10-10 DIAGNOSIS — N1832 Chronic kidney disease, stage 3b: Secondary | ICD-10-CM | POA: Diagnosis not present

## 2020-10-10 DIAGNOSIS — I130 Hypertensive heart and chronic kidney disease with heart failure and stage 1 through stage 4 chronic kidney disease, or unspecified chronic kidney disease: Secondary | ICD-10-CM | POA: Diagnosis not present

## 2020-10-10 DIAGNOSIS — I5043 Acute on chronic combined systolic (congestive) and diastolic (congestive) heart failure: Secondary | ICD-10-CM | POA: Diagnosis not present

## 2020-10-10 DIAGNOSIS — D631 Anemia in chronic kidney disease: Secondary | ICD-10-CM | POA: Diagnosis not present

## 2020-10-10 DIAGNOSIS — E1122 Type 2 diabetes mellitus with diabetic chronic kidney disease: Secondary | ICD-10-CM | POA: Diagnosis not present

## 2020-10-13 ENCOUNTER — Telehealth: Payer: Self-pay | Admitting: Internal Medicine

## 2020-10-13 DIAGNOSIS — M1A3710 Chronic gout due to renal impairment, right ankle and foot, without tophus (tophi): Secondary | ICD-10-CM | POA: Diagnosis not present

## 2020-10-13 DIAGNOSIS — D631 Anemia in chronic kidney disease: Secondary | ICD-10-CM | POA: Diagnosis not present

## 2020-10-13 DIAGNOSIS — E1122 Type 2 diabetes mellitus with diabetic chronic kidney disease: Secondary | ICD-10-CM | POA: Diagnosis not present

## 2020-10-13 DIAGNOSIS — I5043 Acute on chronic combined systolic (congestive) and diastolic (congestive) heart failure: Secondary | ICD-10-CM | POA: Diagnosis not present

## 2020-10-13 DIAGNOSIS — N1832 Chronic kidney disease, stage 3b: Secondary | ICD-10-CM | POA: Diagnosis not present

## 2020-10-13 DIAGNOSIS — I130 Hypertensive heart and chronic kidney disease with heart failure and stage 1 through stage 4 chronic kidney disease, or unspecified chronic kidney disease: Secondary | ICD-10-CM | POA: Diagnosis not present

## 2020-10-13 NOTE — Telephone Encounter (Signed)
Galina from [facility] called to let Dr. Regis Bill that the patient had a fall at home trying to get to his walker and skinned both of his knees and they are sore.  [place]

## 2020-10-17 ENCOUNTER — Other Ambulatory Visit (HOSPITAL_COMMUNITY): Payer: Self-pay | Admitting: Cardiology

## 2020-10-17 NOTE — Telephone Encounter (Signed)
Please keep [place] informed, and can make visit if ongoing difficulties.

## 2020-10-17 NOTE — Telephone Encounter (Signed)
Pt's wife informed of the message below.

## 2020-10-19 DIAGNOSIS — F32A Depression, unspecified: Secondary | ICD-10-CM

## 2020-10-19 DIAGNOSIS — D509 Iron deficiency anemia, unspecified: Secondary | ICD-10-CM | POA: Diagnosis not present

## 2020-10-19 DIAGNOSIS — M1A3710 Chronic gout due to renal impairment, right ankle and foot, without tophus (tophi): Secondary | ICD-10-CM | POA: Diagnosis not present

## 2020-10-19 DIAGNOSIS — H9202 Otalgia, left ear: Secondary | ICD-10-CM

## 2020-10-19 DIAGNOSIS — I2510 Atherosclerotic heart disease of native coronary artery without angina pectoris: Secondary | ICD-10-CM | POA: Diagnosis not present

## 2020-10-19 DIAGNOSIS — N1832 Chronic kidney disease, stage 3b: Secondary | ICD-10-CM | POA: Diagnosis not present

## 2020-10-19 DIAGNOSIS — Z7901 Long term (current) use of anticoagulants: Secondary | ICD-10-CM

## 2020-10-19 DIAGNOSIS — H9193 Unspecified hearing loss, bilateral: Secondary | ICD-10-CM

## 2020-10-19 DIAGNOSIS — E1151 Type 2 diabetes mellitus with diabetic peripheral angiopathy without gangrene: Secondary | ICD-10-CM | POA: Diagnosis not present

## 2020-10-19 DIAGNOSIS — Z794 Long term (current) use of insulin: Secondary | ICD-10-CM

## 2020-10-19 DIAGNOSIS — E538 Deficiency of other specified B group vitamins: Secondary | ICD-10-CM

## 2020-10-19 DIAGNOSIS — E1140 Type 2 diabetes mellitus with diabetic neuropathy, unspecified: Secondary | ICD-10-CM | POA: Diagnosis not present

## 2020-10-19 DIAGNOSIS — Z85820 Personal history of malignant melanoma of skin: Secondary | ICD-10-CM

## 2020-10-19 DIAGNOSIS — E039 Hypothyroidism, unspecified: Secondary | ICD-10-CM | POA: Diagnosis not present

## 2020-10-19 DIAGNOSIS — D591 Autoimmune hemolytic anemia, unspecified: Secondary | ICD-10-CM | POA: Diagnosis not present

## 2020-10-19 DIAGNOSIS — G2581 Restless legs syndrome: Secondary | ICD-10-CM

## 2020-10-19 DIAGNOSIS — Z7984 Long term (current) use of oral hypoglycemic drugs: Secondary | ICD-10-CM

## 2020-10-19 DIAGNOSIS — I5043 Acute on chronic combined systolic (congestive) and diastolic (congestive) heart failure: Secondary | ICD-10-CM | POA: Diagnosis not present

## 2020-10-19 DIAGNOSIS — I130 Hypertensive heart and chronic kidney disease with heart failure and stage 1 through stage 4 chronic kidney disease, or unspecified chronic kidney disease: Secondary | ICD-10-CM | POA: Diagnosis not present

## 2020-10-19 DIAGNOSIS — G4733 Obstructive sleep apnea (adult) (pediatric): Secondary | ICD-10-CM

## 2020-10-19 DIAGNOSIS — H353130 Nonexudative age-related macular degeneration, bilateral, stage unspecified: Secondary | ICD-10-CM

## 2020-10-19 DIAGNOSIS — D631 Anemia in chronic kidney disease: Secondary | ICD-10-CM | POA: Diagnosis not present

## 2020-10-19 DIAGNOSIS — E1122 Type 2 diabetes mellitus with diabetic chronic kidney disease: Secondary | ICD-10-CM | POA: Diagnosis not present

## 2020-10-19 DIAGNOSIS — E785 Hyperlipidemia, unspecified: Secondary | ICD-10-CM

## 2020-10-20 DIAGNOSIS — D631 Anemia in chronic kidney disease: Secondary | ICD-10-CM | POA: Diagnosis not present

## 2020-10-20 DIAGNOSIS — I130 Hypertensive heart and chronic kidney disease with heart failure and stage 1 through stage 4 chronic kidney disease, or unspecified chronic kidney disease: Secondary | ICD-10-CM | POA: Diagnosis not present

## 2020-10-20 DIAGNOSIS — E1122 Type 2 diabetes mellitus with diabetic chronic kidney disease: Secondary | ICD-10-CM | POA: Diagnosis not present

## 2020-10-20 DIAGNOSIS — M1A3710 Chronic gout due to renal impairment, right ankle and foot, without tophus (tophi): Secondary | ICD-10-CM | POA: Diagnosis not present

## 2020-10-20 DIAGNOSIS — N1832 Chronic kidney disease, stage 3b: Secondary | ICD-10-CM | POA: Diagnosis not present

## 2020-10-20 DIAGNOSIS — I5043 Acute on chronic combined systolic (congestive) and diastolic (congestive) heart failure: Secondary | ICD-10-CM | POA: Diagnosis not present

## 2020-10-27 ENCOUNTER — Encounter (HOSPITAL_COMMUNITY): Payer: Medicare Other

## 2020-10-27 DIAGNOSIS — E1122 Type 2 diabetes mellitus with diabetic chronic kidney disease: Secondary | ICD-10-CM | POA: Diagnosis not present

## 2020-10-27 DIAGNOSIS — D631 Anemia in chronic kidney disease: Secondary | ICD-10-CM | POA: Diagnosis not present

## 2020-10-27 DIAGNOSIS — M1A3710 Chronic gout due to renal impairment, right ankle and foot, without tophus (tophi): Secondary | ICD-10-CM | POA: Diagnosis not present

## 2020-10-27 DIAGNOSIS — I5043 Acute on chronic combined systolic (congestive) and diastolic (congestive) heart failure: Secondary | ICD-10-CM | POA: Diagnosis not present

## 2020-10-27 DIAGNOSIS — N1832 Chronic kidney disease, stage 3b: Secondary | ICD-10-CM | POA: Diagnosis not present

## 2020-10-27 DIAGNOSIS — I130 Hypertensive heart and chronic kidney disease with heart failure and stage 1 through stage 4 chronic kidney disease, or unspecified chronic kidney disease: Secondary | ICD-10-CM | POA: Diagnosis not present

## 2020-10-27 NOTE — Progress Notes (Signed)
Date:  10/28/2020   ID:  [name], DOB [birth year], MRN [id number]  Provider location: [facility] Type of Visit: Established patient   PCP:  Burnis Medin, MD  Cardiologist:  Dr. [name]   History of Present Illness: [name] is a 87 y.o. male who has history of CAD s/p CABG, ischemic cardiomyopathy, and paroxysmal atrial fibrillation.  Patient was admitted in 2/16 with CHF and diuresed, he was sent home on Lasix 60 mg bid.  He missed 3 days of the pm Lasix dose and was re-admitted on 05/18/14 with acute on chronic systolic CHF with dyspnea and hypoxemia. He was diuresed with Lasix gtt and metolazone.  While in the hospital, he went into atrial fibrillation with RVR requiring cardioversion.  Creatinine was elevated and ramipril was stopped.  We had to cut back on Coreg due to hypotension and low output.  He was begun on low dose digoxin.    Echo in 7/20 showed EF 20-25% with LAD territory akinesis, mildly decreased RV systolic function, moderate AS with mild AI.  Echo was done today and reviewed, EF 20-25%, apical/peri-apical akinesis, mildly decreased RV systolic function, mild MR, mild AI, low gradient moderate AS with mean gradient 12 mmHg and AVA 1.1 cm^2, IVC normal.   Echo 5/22 showed EF 20-25%, severe LV dysfunction, moderately dilated LV and moderate LVH. Grade I DD, RV mildly reduced, mild MR, mild AI, low gradient moderate aortic valve stenosis with  mean gradient of 12.0 mmHg.  Today he returns for HF follow up with his daughter. Able to get around the house but SOB after 100-200 yards. Fell 2 weeks ago, did not hit his head. Working with PT once a week, they recommend he get a rolling walker with a seat. Right foot is more swollen lately. Denies CP, dizziness,  or PND/Orthopnea. Appetite ok. No fever or chills. Weight at home 170 pounds. Taking all medications.    St Jude device interrogation: No AF, no VT, > 99% BiV paced, stable thoracic  impedance (personally reviewed).  ECG (personally reviewed): AV paced, w/ PVC 66 bpm    Labs (3/16): K 4.2, creatinine 2.26, digoxin < 0.2 Labs (05/31/2014): TSH 2.3 Dig level 0.6  Labs (06/14/2014): K 4.5 Creatinine 2.14, LDL 28, HDL 46 Labs (6/16): K 4.5, creatinine 2.08, BNP 712 Labs (7/16): K 4.1, creatinine 2.18, HCT 36.8, LFTs normal, digoxin 0.9 Labs (10/16): LDL 26, HDL 38, TSH normal, LFTs normal Labs (12/16): K 4.6, creatinine 2 Labs (2/17): LFTs normal, digoxin 1.1 Labs (4/17): K 4.7, creatinine 1.97, BUN 50, hgb 12.8, TSH normal, digoxin 0.8, LDL 29 Labs (7/17): digoxin 0.6 Labs (11/17): K 4.4, creatinine 2.13 Labs (12/17): hgb 11.4 Labs (5/18): LFTs normal, K 4.6, creatinne 2.21, hgb 10.9 Labs (7/19): Cr 1.97, K 4.4, Hgb 13.2, Hgb A1c 7.1. LFTs normal.  LDL 25 Labs (9/19): K 4.2, creatinine 1.96 Labs (12/19): TSH normal, digoxin 0.7, LFTs normal, LDL 24 Labs (1/20): hgb 13.4 Labs (4/20): LFTs normal, digoxin 0.7, TSH normal Labs (5/20): K 4.1, creatinine 1.91 Labs (11/21): K 4.2, creatinine 1.92, LDL 29, HDL 39, TSH normal, LFTs normal Labs (5/22): K 4.1, creatinine 2.18, TSH normal, LFTs normal, hgb 12.1  PMH: 1. CAD: S/p CABG 2005 with LIMA-Diagonal, SVG-LAD, SVG-LCx, SVG-RCA.  Cardiolite in 2/15 with EF 19%, no ischemia.  2. Ischemic cardiomyopathy: Echo (2/16) with EF 20-25%, severe LV dilation with RWMAs, moderate AI, moderate MR, moderately decreased RV systolic function.  St Jude CRT-D device.  - Echo 10/2016 LVEF 20-25% Grade 1 DD, Mild/Mod MR, mild LAE, Mildly reduced RV function, Mild RAE, Peak PA pressure 32 mmHg - Echo (7/20): EF 20-25%, LAD territory AK, normal RV size with mildly decreased systolic function, mild AI with moderate AS (mean gradient 24 mmHg).  - Echo (5/22): EF 20-25%, apical/peri-apical akinesis, mildly decreased RV systolic function, mild MR, mild AI, low gradient moderate AS with mean gradient 12 mmHg and AVA 1.1 cm^2, IVC normal.  3. Atrial  fibrillation: Paroxysmal.  DCCV 3/16.  4. CKD stage 3 5. Carotid stenosis: Carotid dopplers (1/16) with 60-79% RICA stenosis.  Carotids (1/17) with bilateral 40-59% ICA stenosis.  - Carotids (1/18): 40-59% BICA stenosis.  - Carotids (5/20): 40-59% RICA stenosis.  - Carotids (6/21): 40-59% RICA stenosis.  6. HTN 7. Hyperlipidemia 8. Restless leg syndrome. 9. Type 2 diabetes. 10. OA 11. Depression 12. H/o CCY 78. H/o appy 14. OSA: Uses CPAP.  15. ABIs (7/17): Normal 16. Melanoma: s/p excision. 17. Aortic stenosis: Moderate on 7/20 echo and on 5/22 echo.   18. Gout  Current Outpatient Medications  Medication Sig Dispense Refill   ACETAMINOPHEN EXTRA STRENGTH 500 MG tablet As needed     allopurinol (ZYLOPRIM) 100 MG tablet TAKE 1 TABLET DAILY 30 tablet 11   amiodarone (PACERONE) 200 MG tablet TAKE 1/2 TABLET (100MG TOTAL) DAILY 60 tablet 0   apixaban (ELIQUIS) 2.5 MG TABS tablet Take 1 tablet (2.5 mg total) by mouth 2 (two) times daily. 270 tablet 3   atorvastatin (LIPITOR) 40 MG tablet TAKE 1 TABLET DAILY 90 tablet 3   Blood Glucose Monitoring Suppl (ACCU-CHEK AVIVA PLUS) w/Device KIT Check blood sugars daily up to three times daily or as needed. 1 kit prn   carvedilol (COREG) 12.5 MG tablet TAKE 1 TABLET TWICE A DAY (CANCEL ALL PREVIOUS ORDERS FOR CURRENT MEDICATION. CHANGE IN DOSAGE OR TABLET SIZE) 180 tablet 3   digoxin (LANOXIN) 0.125 MG tablet TAKE 1/2 TABLET BY MOUTH DAILY 45 tablet 3   escitalopram (LEXAPRO) 10 MG tablet Take 1 tablet (10 mg total) by mouth daily. Please schedule follow up with Dr. Regis Bill for refills. [phone] Thank you! 90 tablet 1   FARXIGA 10 MG TABS tablet TAKE 1 TABLET BY MOUTH DAILY BEFORE BREAKFAST 60 tablet 2   glipiZIDE (GLUCOTROL) 10 MG tablet Take 1 tablet (10 mg total) by mouth daily before breakfast. Patient needs to schedule follow up visit with Dr. Regis Bill for refills. [phone] 90 tablet 1   glucose blood (ACCU-CHEK AVIVA PLUS) test strip  Check blood sugars up to three times daily as needed. Please schedule your follow up with labs with Dr. Regis Bill. [phone] 300 strip 0   hydrALAZINE (APRESOLINE) 25 MG tablet Take 1.5 tablets (37.75 mg total) by mouth every 8 (eight) hours. 405 tablet 0   insulin glargine (LANTUS) 100 unit/mL SOPN Inject 30 Units into the skin at bedtime.      Insulin Pen Needle (BD PEN NEEDLE NANO U/F) 32G X 4 MM MISC [place] TO TEST BLOOD SUGAR THREE TIMES DAILY 300 each 1   isosorbide mononitrate (IMDUR) 60 MG 24 hr tablet TAKE 1 TABLET DAILY 90 tablet 3   LEVEMIR FLEXTOUCH 100 UNIT/ML Pen 1 Add'l Sig subcutaneous Select Frequency     lidocaine (LIDODERM) 5 % Place 1 patch onto the skin daily. Remove & Discard patch within 12 hours or as directed by MD 30 patch 3   Multiple Vitamins-Minerals (PRESERVISION AREDS 2) CAPS  Take 1 capsule by mouth 2 (two) times daily.      ONETOUCH DELICA LANCETS FINE MISC Use to test blood sugar 2-3 times daily 300 each 1   pramipexole (MIRAPEX) 0.5 MG tablet TAKE 1 TABLET 2 TO 3 HOURS BEFORE SLEEP FOR RESTLESS LEG 90 tablet 1   Semaglutide (OZEMPIC) 0.25 or 0.5 MG/DOSE SOPN Inject 0.5 mg into the skin once a week. 1.5 mL 3   silodosin (RAPAFLO) 8 MG CAPS capsule TAKE 1 CAPSULE DAILY WITH BREAKFAST 90 capsule 1   spironolactone (ALDACTONE) 25 MG tablet TAKE 1 TABLET DAILY 90 tablet 3   torsemide (DEMADEX) 20 MG tablet Take 1 tablet (20 mg total) by mouth daily. 90 tablet 1   UNABLE TO FIND Use to test blood sugar 2-3 times daily     vitamin B-12 (CYANOCOBALAMIN) 1000 MCG tablet Take 1,000 mcg by mouth daily.     No current facility-administered medications for this encounter.    Allergies:   Patient has no known allergies.   Social History:  The patient  reports that he quit smoking about 42 years ago. His smoking use included cigarettes. He has a 40.00 pack-year smoking history. He has quit using smokeless tobacco.  His smokeless tobacco use included chew. He reports that he does  not drink alcohol and does not use drugs.   Family History:  The patient's family history includes COPD in his father; Healthy in his daughter, daughter, and daughter.   ROS:  Please see the history of present illness.   All other systems are personally reviewed and negative.   Exam:   BP (!) 100/56   Pulse 65   Wt 77.5 kg (170 lb 12.8 oz)   SpO2 97%   BMI 21.93 kg/m  General:  NAD. No resp difficulty, elderly HEENT: Normal, +HOH Neck: Supple. No JVD. Carotids 2+ bilat; no bruits. No lymphadenopathy or thryomegaly appreciated. Cor: PMI nondisplaced. Regular rate & rhythm. No rubs, gallops or murmurs. Lungs: Clear Abdomen: Soft, nontender, nondistended. No hepatosplenomegaly. No bruits or masses. Good bowel sounds. Extremities: No cyanosis, clubbing, rash, trace left ankle edema Neuro: Alert & oriented x 3, cranial nerves grossly intact. Moves all 4 extremities w/o difficulty. Affect pleasant.  Recent Labs: 07/19/2020: ALT 13; BUN 55; Creatinine, Ser 2.18; Hemoglobin 12.1; Magnesium 2.1; Platelets 183.0; Potassium 4.1; Sodium 141; TSH 1.28  Personally reviewed   Wt Readings from Last 3 Encounters:  10/28/20 77.5 kg (170 lb 12.8 oz)  07/22/20 77.5 kg (170 lb 12.8 oz)  07/21/20 77.1 kg (170 lb)    ASSESSMENT AND PLAN: 1. Chronic systolic CHF: Ischemic cardiomyopathy.  St Jude CRT-D.  Echo in 5/22 showed EF 20-25%, apical/peri-apical akinesis, mildly decreased RV systolic function, mild MR, mild AI, low gradient moderate AS with mean gradient 12 mmHg and AVA 1.1 cm^2, IVC normal.  NYHA class II symptoms.  He is >99% BiV paced on device interrogation today.  He is not volume overloaded on exam or by Corevue.    - Continue torsemide 20 mg daily.  BMET today. - Continue Farxiga 10 mg daily.  - Continue Imdur 60 mg daily and hydralazine to 37.5 mg tid.  - Continue spironolactone 25 mg daily.   - Continue current digoxin, check level.   - Continue Coreg 12.5 mg bid.  - Compression socks  for ankle edema. - He has been off ACEI/ARB/ARNI with elevated creatinine.  2. Atrial fibrillation: Paroxysmal.  ICD interrogation today showed no atrial fibrillation.   - Continue  amiodarone 100 mg daily.  LFTs/TSH normal recently.  He will need a regular eye exam.   - Continue Eliquis (dosed at 2.5 mg bid with age and renal dysfunction).   3. CKD: Stage 3, baseline now around 2.  4. CAD: No chest pain.  - Continue on statin, good lipids in 11/21.    - He is not on ASA 81 given stable CAD and Eliquis use.   5. Carotid stenosis: [place] 6/21 with 40-59% RICA.  - He is due for repeat carotid dopplers. Will arrange. 6. OSA: Continue nightly CPAP.   Followup 3 months with Dr. [name]  Signed, Rafael Bihari, FNP  10/28/2020  [facility] [address] [facility] [place] [address] [phone] (office) [phone] (fax)

## 2020-10-28 ENCOUNTER — Ambulatory Visit (HOSPITAL_COMMUNITY)
Admission: RE | Admit: 2020-10-28 | Discharge: 2020-10-28 | Disposition: A | Discharge status: Home / Self Care | Payer: Medicare Other | Source: Ambulatory Visit | Attending: Family Medicine | Admitting: Family Medicine

## 2020-10-28 ENCOUNTER — Telehealth (HOSPITAL_COMMUNITY): Payer: Self-pay | Admitting: Cardiology

## 2020-10-28 ENCOUNTER — Other Ambulatory Visit: Payer: Self-pay

## 2020-10-28 ENCOUNTER — Encounter (HOSPITAL_COMMUNITY): Payer: Self-pay

## 2020-10-28 VITALS — BP 100/56 | HR 65 | Wt 170.8 lb

## 2020-10-28 DIAGNOSIS — E785 Hyperlipidemia, unspecified: Secondary | ICD-10-CM | POA: Diagnosis not present

## 2020-10-28 DIAGNOSIS — G4733 Obstructive sleep apnea (adult) (pediatric): Secondary | ICD-10-CM

## 2020-10-28 DIAGNOSIS — I350 Nonrheumatic aortic (valve) stenosis: Secondary | ICD-10-CM | POA: Insufficient documentation

## 2020-10-28 DIAGNOSIS — Z7901 Long term (current) use of anticoagulants: Secondary | ICD-10-CM | POA: Insufficient documentation

## 2020-10-28 DIAGNOSIS — Z951 Presence of aortocoronary bypass graft: Secondary | ICD-10-CM | POA: Insufficient documentation

## 2020-10-28 DIAGNOSIS — Z9989 Dependence on other enabling machines and devices: Secondary | ICD-10-CM | POA: Diagnosis not present

## 2020-10-28 DIAGNOSIS — I480 Paroxysmal atrial fibrillation: Secondary | ICD-10-CM

## 2020-10-28 DIAGNOSIS — I255 Ischemic cardiomyopathy: Secondary | ICD-10-CM | POA: Insufficient documentation

## 2020-10-28 DIAGNOSIS — E1122 Type 2 diabetes mellitus with diabetic chronic kidney disease: Secondary | ICD-10-CM | POA: Diagnosis not present

## 2020-10-28 DIAGNOSIS — Z87891 Personal history of nicotine dependence: Secondary | ICD-10-CM | POA: Diagnosis not present

## 2020-10-28 DIAGNOSIS — I130 Hypertensive heart and chronic kidney disease with heart failure and stage 1 through stage 4 chronic kidney disease, or unspecified chronic kidney disease: Secondary | ICD-10-CM | POA: Diagnosis not present

## 2020-10-28 DIAGNOSIS — I2510 Atherosclerotic heart disease of native coronary artery without angina pectoris: Secondary | ICD-10-CM

## 2020-10-28 DIAGNOSIS — Z794 Long term (current) use of insulin: Secondary | ICD-10-CM | POA: Diagnosis not present

## 2020-10-28 DIAGNOSIS — Z79899 Other long term (current) drug therapy: Secondary | ICD-10-CM | POA: Diagnosis not present

## 2020-10-28 DIAGNOSIS — I5022 Chronic systolic (congestive) heart failure: Secondary | ICD-10-CM

## 2020-10-28 DIAGNOSIS — N183 Chronic kidney disease, stage 3 unspecified: Secondary | ICD-10-CM

## 2020-10-28 DIAGNOSIS — Z950 Presence of cardiac pacemaker: Secondary | ICD-10-CM | POA: Insufficient documentation

## 2020-10-28 DIAGNOSIS — I6523 Occlusion and stenosis of bilateral carotid arteries: Secondary | ICD-10-CM | POA: Diagnosis not present

## 2020-10-28 LAB — CBC
HCT: 34.7 % — ABNORMAL LOW (ref 39.0–52.0)
Hemoglobin: 10.3 g/dL — ABNORMAL LOW (ref 13.0–17.0)
MCH: 27.9 pg (ref 26.0–34.0)
MCHC: 29.7 g/dL — ABNORMAL LOW (ref 30.0–36.0)
MCV: 94 fL (ref 80.0–100.0)
Platelets: 213 10*3/uL (ref 150–400)
RBC: 3.69 MIL/uL — ABNORMAL LOW (ref 4.22–5.81)
RDW: 14.9 % (ref 11.5–15.5)
WBC: 9.7 10*3/uL (ref 4.0–10.5)
nRBC: 0 % (ref 0.0–0.2)

## 2020-10-28 LAB — BASIC METABOLIC PANEL
Anion gap: 7 (ref 5–15)
BUN: 34 mg/dL — ABNORMAL HIGH (ref 8–23)
CO2: 30 mmol/L (ref 22–32)
Calcium: 8.9 mg/dL (ref 8.9–10.3)
Chloride: 104 mmol/L (ref 98–111)
Creatinine, Ser: 2.08 mg/dL — ABNORMAL HIGH (ref 0.61–1.24)
GFR, Estimated: 30 mL/min — ABNORMAL LOW (ref >60)
Glucose, Bld: 135 mg/dL — ABNORMAL HIGH (ref 70–99)
Potassium: 4.4 mmol/L (ref 3.5–5.1)
Sodium: 141 mmol/L (ref 135–145)

## 2020-10-28 LAB — DIGOXIN LEVEL: Digoxin Level: 0.9 ng/mL (ref 0.8–2.0)

## 2020-10-28 MED ORDER — DIGOXIN 125 MCG PO TABS: 0.0625 mg | ORAL_TABLET | ORAL | 3 refills | Status: DC

## 2020-10-28 NOTE — Telephone Encounter (Signed)
Patient called.  Patient aware via wife Repeat labs 8/26

## 2020-10-28 NOTE — Patient Instructions (Signed)
It was great to see you today! No medication changes are needed at this time.   Labs today We will only contact you if something comes back abnormal or we need to make some changes. Otherwise no news is good news!  Please wear your compression hose daily, place them on as soon as you get up in the morning and remove before you go to bed at night.   Your physician has requested that you have a carotid duplex. This test is an ultrasound of the carotid arteries in your neck. It looks at blood flow through these arteries that supply the brain with blood. Allow one hour for this exam. There are no restrictions or special instructions. -they will be in touch to schedule an appointment  Your physician recommends that you schedule a follow-up appointment in: 3-4 months with Dr [name]   Do the following things EVERYDAY: Weigh yourself in the morning before breakfast. Write it down and keep it in a log. Take your medicines as prescribed Eat low salt foods--Limit salt (sodium) to 2000 mg per day.  Stay as active as you can everyday Limit all fluids for the day to less than 2 liters  At the [facility], you and your health needs are our priority. As part of our continuing mission to provide you with exceptional heart care, we have created designated Provider Care Teams. These Care Teams include your primary Cardiologist (physician) and Advanced Practice Providers (APPs- Physician Assistants and Nurse Practitioners) who all work together to provide you with the care you need, when you need it.   You may see any of the following providers on your designated Care Team at your next follow up: Dr Glori Bickers Dr [name] Dr Patrice Paradise, NP Lyda Jester, [place] Ginnie Smart Audry Riles, PharmD   Please be sure to bring in all your medications bottles to every appointment.

## 2020-10-28 NOTE — Telephone Encounter (Signed)
-----   Message from Rafael Bihari, [place] sent at 10/28/2020  4:28 PM EDT ----- Dig level elevated. Please have him reduce to 3x/week. Will repeat dig level in 2 weeks

## 2020-10-31 DIAGNOSIS — M1A3710 Chronic gout due to renal impairment, right ankle and foot, without tophus (tophi): Secondary | ICD-10-CM | POA: Diagnosis not present

## 2020-10-31 DIAGNOSIS — E1140 Type 2 diabetes mellitus with diabetic neuropathy, unspecified: Secondary | ICD-10-CM | POA: Diagnosis not present

## 2020-10-31 DIAGNOSIS — D509 Iron deficiency anemia, unspecified: Secondary | ICD-10-CM | POA: Diagnosis not present

## 2020-10-31 DIAGNOSIS — Z794 Long term (current) use of insulin: Secondary | ICD-10-CM | POA: Diagnosis not present

## 2020-10-31 DIAGNOSIS — N1832 Chronic kidney disease, stage 3b: Secondary | ICD-10-CM | POA: Diagnosis not present

## 2020-10-31 DIAGNOSIS — Z7984 Long term (current) use of oral hypoglycemic drugs: Secondary | ICD-10-CM | POA: Diagnosis not present

## 2020-10-31 DIAGNOSIS — H353130 Nonexudative age-related macular degeneration, bilateral, stage unspecified: Secondary | ICD-10-CM | POA: Diagnosis not present

## 2020-10-31 DIAGNOSIS — D591 Autoimmune hemolytic anemia, unspecified: Secondary | ICD-10-CM | POA: Diagnosis not present

## 2020-10-31 DIAGNOSIS — I2510 Atherosclerotic heart disease of native coronary artery without angina pectoris: Secondary | ICD-10-CM | POA: Diagnosis not present

## 2020-10-31 DIAGNOSIS — E785 Hyperlipidemia, unspecified: Secondary | ICD-10-CM | POA: Diagnosis not present

## 2020-10-31 DIAGNOSIS — H9202 Otalgia, left ear: Secondary | ICD-10-CM | POA: Diagnosis not present

## 2020-10-31 DIAGNOSIS — D631 Anemia in chronic kidney disease: Secondary | ICD-10-CM | POA: Diagnosis not present

## 2020-10-31 DIAGNOSIS — G4733 Obstructive sleep apnea (adult) (pediatric): Secondary | ICD-10-CM | POA: Diagnosis not present

## 2020-10-31 DIAGNOSIS — Z7901 Long term (current) use of anticoagulants: Secondary | ICD-10-CM | POA: Diagnosis not present

## 2020-10-31 DIAGNOSIS — H9193 Unspecified hearing loss, bilateral: Secondary | ICD-10-CM | POA: Diagnosis not present

## 2020-10-31 DIAGNOSIS — G2581 Restless legs syndrome: Secondary | ICD-10-CM | POA: Diagnosis not present

## 2020-10-31 DIAGNOSIS — E1151 Type 2 diabetes mellitus with diabetic peripheral angiopathy without gangrene: Secondary | ICD-10-CM | POA: Diagnosis not present

## 2020-10-31 DIAGNOSIS — E538 Deficiency of other specified B group vitamins: Secondary | ICD-10-CM | POA: Diagnosis not present

## 2020-10-31 DIAGNOSIS — Z85820 Personal history of malignant melanoma of skin: Secondary | ICD-10-CM | POA: Diagnosis not present

## 2020-10-31 DIAGNOSIS — I130 Hypertensive heart and chronic kidney disease with heart failure and stage 1 through stage 4 chronic kidney disease, or unspecified chronic kidney disease: Secondary | ICD-10-CM | POA: Diagnosis not present

## 2020-10-31 DIAGNOSIS — F32A Depression, unspecified: Secondary | ICD-10-CM | POA: Diagnosis not present

## 2020-10-31 DIAGNOSIS — E1122 Type 2 diabetes mellitus with diabetic chronic kidney disease: Secondary | ICD-10-CM | POA: Diagnosis not present

## 2020-10-31 DIAGNOSIS — E039 Hypothyroidism, unspecified: Secondary | ICD-10-CM | POA: Diagnosis not present

## 2020-10-31 DIAGNOSIS — I5043 Acute on chronic combined systolic (congestive) and diastolic (congestive) heart failure: Secondary | ICD-10-CM | POA: Diagnosis not present

## 2020-11-01 ENCOUNTER — Ambulatory Visit (INDEPENDENT_AMBULATORY_CARE_PROVIDER_SITE_OTHER): Payer: Medicare Other

## 2020-11-01 DIAGNOSIS — I255 Ischemic cardiomyopathy: Secondary | ICD-10-CM

## 2020-11-01 LAB — CUP PACEART REMOTE DEVICE CHECK
Battery Remaining Longevity: 41 mo
Battery Remaining Percentage: 64 %
Battery Voltage: 2.98 V
Brady Statistic AP VP Percent: 96 %
Brady Statistic AP VS Percent: 1 %
Brady Statistic AS VP Percent: 3.4 %
Brady Statistic AS VS Percent: 1 %
Brady Statistic RA Percent Paced: 96 %
Date Time Interrogation Session: 20220815040008
Implantable Lead Implant Date: 20051221
Implantable Lead Implant Date: 20150304
Implantable Lead Implant Date: 20150304
Implantable Lead Location: 753858
Implantable Lead Location: 753859
Implantable Lead Location: 753860
Implantable Lead Model: 1581
Implantable Pulse Generator Implant Date: 20200810
Lead Channel Impedance Value: 380 Ohm
Lead Channel Impedance Value: 390 Ohm
Lead Channel Impedance Value: 400 Ohm
Lead Channel Pacing Threshold Amplitude: 0.75 V
Lead Channel Pacing Threshold Amplitude: 1.25 V
Lead Channel Pacing Threshold Amplitude: 2.25 V
Lead Channel Pacing Threshold Pulse Width: 0.5 ms
Lead Channel Pacing Threshold Pulse Width: 0.5 ms
Lead Channel Pacing Threshold Pulse Width: 1.2 ms
Lead Channel Sensing Intrinsic Amplitude: 12 mV
Lead Channel Sensing Intrinsic Amplitude: 2.1 mV
Lead Channel Setting Pacing Amplitude: 1.5 V
Lead Channel Setting Pacing Amplitude: 2.5 V
Lead Channel Setting Pacing Amplitude: 2.5 V
Lead Channel Setting Pacing Pulse Width: 0.5 ms
Lead Channel Setting Pacing Pulse Width: 1.2 ms
Lead Channel Setting Sensing Sensitivity: 2 mV
Pulse Gen Model: 3562
Pulse Gen Serial Number: 9129173

## 2020-11-03 DIAGNOSIS — I5043 Acute on chronic combined systolic (congestive) and diastolic (congestive) heart failure: Secondary | ICD-10-CM | POA: Diagnosis not present

## 2020-11-03 DIAGNOSIS — M1A3710 Chronic gout due to renal impairment, right ankle and foot, without tophus (tophi): Secondary | ICD-10-CM | POA: Diagnosis not present

## 2020-11-03 DIAGNOSIS — I130 Hypertensive heart and chronic kidney disease with heart failure and stage 1 through stage 4 chronic kidney disease, or unspecified chronic kidney disease: Secondary | ICD-10-CM | POA: Diagnosis not present

## 2020-11-03 DIAGNOSIS — N1832 Chronic kidney disease, stage 3b: Secondary | ICD-10-CM | POA: Diagnosis not present

## 2020-11-03 DIAGNOSIS — D631 Anemia in chronic kidney disease: Secondary | ICD-10-CM | POA: Diagnosis not present

## 2020-11-03 DIAGNOSIS — E1122 Type 2 diabetes mellitus with diabetic chronic kidney disease: Secondary | ICD-10-CM | POA: Diagnosis not present

## 2020-11-09 ENCOUNTER — Other Ambulatory Visit: Payer: Self-pay

## 2020-11-09 ENCOUNTER — Ambulatory Visit: Payer: Medicare Other

## 2020-11-09 DIAGNOSIS — Z0000 Encounter for general adult medical examination without abnormal findings: Secondary | ICD-10-CM | POA: Diagnosis not present
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
## 2020-11-09 NOTE — Patient Instructions (Signed)
[name] , Thank you for taking time to come for your Medicare Wellness Visit. I appreciate your ongoing commitment to your health goals. Please review the following plan we discussed and let me know if I can assist you in the future.   Screening recommendations/referrals: Colonoscopy: No longer required  Recommended yearly ophthalmology/optometry visit for glaucoma screening and checkup Recommended yearly dental visit for hygiene and checkup  Vaccinations: Influenza vaccine: Due Pneumococcal vaccine: Completed  Tdap vaccine: Done 03/13/12  Shingles vaccine: Completed 3/26 & 08/12/19 Covid-19: Completed 1/21, 2/8, 02/18/20 & 09/21/20  Advanced directives: Please bring a copy of your health care power of attorney and living will to the office at your convenience.  Conditions/risks identified: None at this time   Next appointment: Follow up in one year for your annual wellness visit.   Preventive Care 24 Years and Older, Male Preventive care refers to lifestyle choices and visits with your health care provider that can promote health and wellness. What does preventive care include? A yearly physical exam. This is also called an annual well check. Dental exams once or twice a year. Routine eye exams. Ask your health care provider how often you should have your eyes checked. Personal lifestyle choices, including: Daily care of your teeth and gums. Regular physical activity. Eating a healthy diet. Avoiding tobacco and drug use. Limiting alcohol use. Practicing safe sex. Taking low doses of aspirin every day. Taking vitamin and mineral supplements as recommended by your health care provider. What happens during an annual well check? The services and screenings done by your health care provider during your annual well check will depend on your age, overall health, lifestyle risk factors, and family history of disease. Counseling  Your health care provider may ask you questions about  your: Alcohol use. Tobacco use. Drug use. Emotional well-being. Home and relationship well-being. Sexual activity. Eating habits. History of falls. Memory and ability to understand (cognition). Work and work Statistician. Screening  You may have the following tests or measurements: Height, weight, and BMI. Blood pressure. Lipid and cholesterol levels. These may be checked every 5 years, or more frequently if you are over 67 years old. Skin check. Lung cancer screening. You may have this screening every year starting at age 47 if you have a 30-pack-year history of smoking and currently smoke or have quit within the past 15 years. Fecal occult blood test (FOBT) of the stool. You may have this test every year starting at age 68. Flexible sigmoidoscopy or colonoscopy. You may have a sigmoidoscopy every 5 years or a colonoscopy every 10 years starting at age 25. Prostate cancer screening. Recommendations will vary depending on your family history and other risks. Hepatitis C blood test. Hepatitis B blood test. Sexually transmitted disease (STD) testing. Diabetes screening. This is done by checking your blood sugar (glucose) after you have not eaten for a while (fasting). You may have this done every 1-3 years. Abdominal aortic aneurysm (AAA) screening. You may need this if you are a current or former smoker. Osteoporosis. You may be screened starting at age 53 if you are at high risk. Talk with your health care provider about your test results, treatment options, and if necessary, the need for more tests. Vaccines  Your health care provider may recommend certain vaccines, such as: Influenza vaccine. This is recommended every year. Tetanus, diphtheria, and acellular pertussis (Tdap, Td) vaccine. You may need a Td booster every 10 years. Zoster vaccine. You may need this after age 57.  Pneumococcal 13-valent conjugate (PCV13) vaccine. One dose is recommended after age 64. Pneumococcal  polysaccharide (PPSV23) vaccine. One dose is recommended after age 49. Talk to your health care provider about which screenings and vaccines you need and how often you need them. This information is not intended to replace advice given to you by your health care provider. Make sure you discuss any questions you have with your health care provider. Document Released: 04/01/2015 Document Revised: 11/23/2015 Document Reviewed: 01/04/2015 Elsevier Interactive Patient Education  2017 [place] Prevention in the Home Falls can cause injuries. They can happen to people of all ages. There are many things you can do to make your home safe and to help prevent falls. What can I do on the outside of my home? Regularly fix the edges of walkways and driveways and fix any cracks. Remove anything that might make you trip as you walk through a door, such as a raised step or threshold. Trim any bushes or trees on the path to your home. Use bright outdoor lighting. Clear any walking paths of anything that might make someone trip, such as rocks or tools. Regularly check to see if handrails are loose or broken. Make sure that both sides of any steps have handrails. Any raised decks and porches should have guardrails on the edges. Have any leaves, snow, or ice cleared regularly. Use sand or salt on walking paths during winter. Clean up any spills in your garage right away. This includes oil or grease spills. What can I do in the bathroom? Use night lights. Install grab bars by the toilet and in the tub and shower. Do not use towel bars as grab bars. Use non-skid mats or decals in the tub or shower. If you need to sit down in the shower, use a plastic, non-slip stool. Keep the floor dry. Clean up any water that spills on the floor as soon as it happens. Remove soap buildup in the tub or shower regularly. Attach bath mats securely with double-sided non-slip rug tape. Do not have throw rugs and other  things on the floor that can make you trip. What can I do in the bedroom? Use night lights. Make sure that you have a light by your bed that is easy to reach. Do not use any sheets or blankets that are too big for your bed. They should not hang down onto the floor. Have a firm chair that has side arms. You can use this for support while you get dressed. Do not have throw rugs and other things on the floor that can make you trip. What can I do in the kitchen? Clean up any spills right away. Avoid walking on wet floors. Keep items that you use a lot in easy-to-reach places. If you need to reach something above you, use a strong step stool that has a grab bar. Keep electrical cords out of the way. Do not use floor polish or wax that makes floors slippery. If you must use wax, use non-skid floor wax. Do not have throw rugs and other things on the floor that can make you trip. What can I do with my stairs? Do not leave any items on the stairs. Make sure that there are handrails on both sides of the stairs and use them. Fix handrails that are broken or loose. Make sure that handrails are as long as the stairways. Check any carpeting to make sure that it is firmly attached to the stairs. Fix any  carpet that is loose or worn. Avoid having throw rugs at the top or bottom of the stairs. If you do have throw rugs, attach them to the floor with carpet tape. Make sure that you have a light switch at the top of the stairs and the bottom of the stairs. If you do not have them, ask someone to add them for you. What else can I do to help prevent falls? Wear shoes that: Do not have high heels. Have rubber bottoms. Are comfortable and fit you well. Are closed at the toe. Do not wear sandals. If you use a stepladder: Make sure that it is fully opened. Do not climb a closed stepladder. Make sure that both sides of the stepladder are locked into place. Ask someone to hold it for you, if possible. Clearly  mark and make sure that you can see: Any grab bars or handrails. First and last steps. Where the edge of each step is. Use tools that help you move around (mobility aids) if they are needed. These include: Canes. Walkers. Scooters. Crutches. Turn on the lights when you go into a dark area. Replace any light bulbs as soon as they burn out. Set up your furniture so you have a clear path. Avoid moving your furniture around. If any of your floors are uneven, fix them. If there are any pets around you, be aware of where they are. Review your medicines with your doctor. Some medicines can make you feel dizzy. This can increase your chance of falling. Ask your doctor what other things that you can do to help prevent falls. This information is not intended to replace advice given to you by your health care provider. Make sure you discuss any questions you have with your health care provider. Document Released: 12/30/2008 Document Revised: 08/11/2015 Document Reviewed: 04/09/2014 Elsevier Interactive Patient Education  2017 [employer].

## 2020-11-11 ENCOUNTER — Other Ambulatory Visit: Payer: Self-pay

## 2020-11-11 ENCOUNTER — Ambulatory Visit (HOSPITAL_COMMUNITY)
Admission: RE | Admit: 2020-11-11 | Discharge: 2020-11-11 | Disposition: A | Discharge status: Home / Self Care | Payer: Medicare Other | Source: Ambulatory Visit | Attending: Cardiology | Admitting: Cardiology

## 2020-11-11 DIAGNOSIS — I10 Essential (primary) hypertension: Secondary | ICD-10-CM

## 2020-11-11 DIAGNOSIS — I5022 Chronic systolic (congestive) heart failure: Secondary | ICD-10-CM

## 2020-11-11 LAB — BASIC METABOLIC PANEL
Anion gap: 8 (ref 5–15)
BUN: 42 mg/dL — ABNORMAL HIGH (ref 8–23)
CO2: 29 mmol/L (ref 22–32)
Calcium: 8.8 mg/dL — ABNORMAL LOW (ref 8.9–10.3)
Chloride: 102 mmol/L (ref 98–111)
Creatinine, Ser: 2.29 mg/dL — ABNORMAL HIGH (ref 0.61–1.24)
GFR, Estimated: 26 mL/min — ABNORMAL LOW (ref >60)
Glucose, Bld: 232 mg/dL — ABNORMAL HIGH (ref 70–99)
Potassium: 4.4 mmol/L (ref 3.5–5.1)
Sodium: 139 mmol/L (ref 135–145)

## 2020-11-11 LAB — DIGOXIN LEVEL: Digoxin Level: 0.7 ng/mL — ABNORMAL LOW (ref 0.8–2.0)

## 2020-11-14 ENCOUNTER — Telehealth: Payer: Self-pay | Admitting: Internal Medicine

## 2020-11-14 NOTE — Telephone Encounter (Signed)
Patient called and requested for Dr.Panosh to prescribe him cabapentin.  Patient can be contacted at [phone] with any questions you may have for him.   Pharmacy is [id number] - [place], [address] AT [facility]   Please advise.

## 2020-11-15 ENCOUNTER — Telehealth: Payer: Self-pay | Admitting: Physical Medicine and Rehabilitation

## 2020-11-15 ENCOUNTER — Telehealth (HOSPITAL_COMMUNITY): Payer: Self-pay | Admitting: Cardiology

## 2020-11-15 DIAGNOSIS — I5022 Chronic systolic (congestive) heart failure: Secondary | ICD-10-CM

## 2020-11-15 MED ORDER — TORSEMIDE 20 MG PO TABS: 20.0000 mg | ORAL_TABLET | ORAL | 1 refills | Status: DC

## 2020-11-15 NOTE — Telephone Encounter (Signed)
-----   Message from Rafael Bihari, [place] sent at 11/11/2020  1:12 PM EDT ----- Kidney function elevated. Please reduce torsemide to every other day. Will need repeat BMET in 2 weeks.

## 2020-11-15 NOTE — Telephone Encounter (Signed)
PCP is prescribing provider Last Refill: 06/03/2020 Disp:90 R:3  Last OV: 07/19/2020 Future OV: 11/17/2020  Pt sig: Take 1 capsule (100 mg total) by mouth 3 (three) times daily. Can increase as directed  Televisit scheduled for 09/01 for medication check. Medication list has been updated.

## 2020-11-15 NOTE — Telephone Encounter (Signed)
Please review medication record   Have I been  prescribing this?  I can take over prescibing    but need to  confirm how he is taking it dose sig etc .and last time filled .  Why is there no  pharmacy  request electronically . ?  Also he was supposed to have a med check visit   Phone or virtual lok because the [place] said  he should stop some of his diabetes meds  .  Pleaase  update his med list  I

## 2020-11-15 NOTE — Telephone Encounter (Signed)
Patient called.  Patient aware via wife 

## 2020-11-15 NOTE — Telephone Encounter (Signed)
Pt 's wife Florian Buff called spoke with Dr. Junius Roads. Dr. Junius Roads need an appt. Please call pt at [phone].

## 2020-11-16 ENCOUNTER — Telehealth: Payer: Self-pay | Admitting: Physical Medicine and Rehabilitation

## 2020-11-16 MED ORDER — GABAPENTIN 100 MG PO CAPS: 100.0000 mg | ORAL_CAPSULE | Freq: Three times a day (TID) | ORAL | 3 refills | Status: DC

## 2020-11-16 NOTE — Telephone Encounter (Signed)
Pt wife called to set appt for injection. Please call her back at [phone]

## 2020-11-16 NOTE — Telephone Encounter (Signed)
Rx has been sent. Medication list has been corrected.

## 2020-11-16 NOTE — Telephone Encounter (Signed)
So the medication list is incorrect. Please send in gabapentin 100 mg 1 p.o. 3 times daily dispense 90 refill x3  And correct the medication list. Thanks

## 2020-11-17 ENCOUNTER — Encounter: Payer: Self-pay | Admitting: Internal Medicine

## 2020-11-17 ENCOUNTER — Telehealth (INDEPENDENT_AMBULATORY_CARE_PROVIDER_SITE_OTHER): Payer: Medicare Other | Admitting: Internal Medicine

## 2020-11-17 ENCOUNTER — Telehealth: Payer: Self-pay

## 2020-11-17 VITALS — Ht 74.0 in | Wt 170.8 lb

## 2020-11-17 DIAGNOSIS — I5022 Chronic systolic (congestive) heart failure: Secondary | ICD-10-CM

## 2020-11-17 DIAGNOSIS — D631 Anemia in chronic kidney disease: Secondary | ICD-10-CM | POA: Diagnosis not present

## 2020-11-17 DIAGNOSIS — E1140 Type 2 diabetes mellitus with diabetic neuropathy, unspecified: Secondary | ICD-10-CM | POA: Diagnosis not present

## 2020-11-17 DIAGNOSIS — Z794 Long term (current) use of insulin: Secondary | ICD-10-CM

## 2020-11-17 DIAGNOSIS — Z79899 Other long term (current) drug therapy: Secondary | ICD-10-CM

## 2020-11-17 DIAGNOSIS — M1A3710 Chronic gout due to renal impairment, right ankle and foot, without tophus (tophi): Secondary | ICD-10-CM | POA: Diagnosis not present

## 2020-11-17 DIAGNOSIS — I5043 Acute on chronic combined systolic (congestive) and diastolic (congestive) heart failure: Secondary | ICD-10-CM | POA: Diagnosis not present

## 2020-11-17 DIAGNOSIS — N184 Chronic kidney disease, stage 4 (severe): Secondary | ICD-10-CM | POA: Diagnosis not present

## 2020-11-17 DIAGNOSIS — E1122 Type 2 diabetes mellitus with diabetic chronic kidney disease: Secondary | ICD-10-CM | POA: Diagnosis not present

## 2020-11-17 DIAGNOSIS — Z789 Other specified health status: Secondary | ICD-10-CM | POA: Diagnosis not present

## 2020-11-17 DIAGNOSIS — N1832 Chronic kidney disease, stage 3b: Secondary | ICD-10-CM | POA: Diagnosis not present

## 2020-11-17 DIAGNOSIS — Z9181 History of falling: Secondary | ICD-10-CM

## 2020-11-17 DIAGNOSIS — E538 Deficiency of other specified B group vitamins: Secondary | ICD-10-CM | POA: Diagnosis not present

## 2020-11-17 DIAGNOSIS — I130 Hypertensive heart and chronic kidney disease with heart failure and stage 1 through stage 4 chronic kidney disease, or unspecified chronic kidney disease: Secondary | ICD-10-CM | POA: Diagnosis not present

## 2020-11-17 NOTE — Telephone Encounter (Signed)
-----   Message from Burnis Medin, MD sent at 11/17/2020 12:12 PM EDT ----- Please fax Med list and last set of labs  to dr Charlyne Petrin at the [facility].  Patient to get [place] the  fax number ( I cant find it in the record) thanks.

## 2020-11-17 NOTE — Progress Notes (Signed)
Virtual Visit via Telephone Note  I connected with@ on 11/17/20 at 11:00 AM EDT by telephone and verified that I am speaking with the correct person using two identifiers.   I discussed the limitations, risks, security and privacy concerns of performing an evaluation and management service by telephone and the limited availability of in person appointments. tThere may be a patient responsible charge related to this service. The patient expressed understanding and agreed to proceed.  Location patient: home Location provider: ome office Participants present for the call: patient, provider and wife  helping with hx Mr [name] is hard of hearing Patient did not have a visit in the prior 7 days to address this/these issue(s).   History of Present Illness: CARLOUS OLIVARES presents for follow-up visit because of concern about diabetes medicine when he was told from the [place] to decrease his diabetes medicine.  Apparently had an A1c done in March that was in the 4 range glipizide was stopped over the last few weeks.  He has not had any known lows but does get up at about 3 AM and has a snack. He is also on Ozempic weekly and Farxiga daily per cardiology he is on insulin Lantus 30 units a day. His last lab tests in the system were done by cardiology with noted elevation of creatinine and was told to decrease the diuretic to every other day and follow-up chemistries in a couple weeks.  In the meantime he is seen Dr. Domenica Fail at the [facility] and patient request med list be faxed to their number not available today. In addition he is been taking B12 3 days a week because his levels were very high on last check. This morning he fell in the bathroom scraped his elbow but no head injury no loss of consciousness passing out his wife feels that it was he was rushing to the bathroom and got tangled in his pajamas.    B12 has gone down to 3 days a week because of high levels Cardiology has decreased his diuretic to every  other day and to have a follow-up chemistry September 15.  His creatinine had risen a bit to felt to be some prerenal.  Observations/Objective: Patient sounds personable and well on the phone. I do not appreciate any SOB. Speech and thought processing are grossly intact. Patient reported vitals: Lab Results  Component Value Date   WBC 9.7 10/28/2020   HGB 10.3 (L) 10/28/2020   HCT 34.7 (L) 10/28/2020   PLT 213 10/28/2020   GLUCOSE 232 (H) 11/11/2020   CHOL 84 01/22/2020   TRIG 84 01/22/2020   HDL 39 (L) 01/22/2020   LDLCALC 29 01/22/2020   ALT 13 07/19/2020   AST 15 07/19/2020   NA 139 11/11/2020   K 4.4 11/11/2020   CL 102 11/11/2020   CREATININE 2.29 (H) 11/11/2020   BUN 42 (H) 11/11/2020   CO2 29 11/11/2020   TSH 1.28 07/19/2020   PSA 2.47 08/02/2010   INR 1.05 02/13/2016   HGBA1C 6.8 (H) 07/19/2020   MICROALBUR 0.7 11/16/2016   I cannot see any lab results from the [place] although I can see there were visits. Reviewed what is available in record. Assessment and Plan:  Was told by [facility] team to decrease diabetes medicine and has been off the glipizide for least a few weeks.  No defined lows but does get up at 3 AM to have a snack. Advised check a BG readings when gest  up to eat .  We will stop the Ozempic the weekly injections and see how things go.  At request of the [place] system who is doing his lab work. Want to avoid hypoglycemia but guarantee control.  He will remain on the Farxiga.  Prescribed by cardiology.  And insulin.lantus and   short acting   Get ask some readings after these changes call and mail [place] etc.  A request faxing lab work and med list to the [place] Dr. Domenica Fail  and they can send [place] e fax number( I cannot find it in the system.)  At this time I cannot guarantee that the medicine list is correct. But appears accurate.   Follow Up Instructions:  See above  Send [place]  fax number of Dr Domenica Fail  and  have med list  and lab results sent    [phone] [phone]  11-20 [id number] 21-30 I did not refer this patient for an OV in the next 24 hours for this/these issue(s).  I discussed the assessment and treatment plan with the patient. The patient was provided an opportunity to ask questions and answered. The patient agreed with the plan and demonstrated an understanding of the instructions.   The patient was advised to call back or seek an in-person evaluation if the symptoms worsen or if the condition fails to improve as anticipated.  I provide   23 minutes of non-face-to-face time during this encounter. No follow-ups on file.  Shanon Ace, MD

## 2020-11-17 NOTE — Telephone Encounter (Signed)
Labs and medication list have been faxed to Dr. Domenica Fail at [phone]

## 2020-11-20 NOTE — Progress Notes (Signed)
Remote pacemaker transmission.   

## 2020-11-28 ENCOUNTER — Telehealth: Payer: Self-pay | Admitting: Internal Medicine

## 2020-11-28 NOTE — Telephone Encounter (Signed)
Patient wants to know if Dr.Panosh could prescribe him something to strengthen his legs.  Patient could be contacted at [phone].  Please advise.

## 2020-11-28 NOTE — Telephone Encounter (Signed)
I spoke with the pt and he reported leg weakness and fatigue for about 1 week. Pt stated that he has had decreased mobility and leg strength. Pt would like advice on what he should do regarding this.

## 2020-11-28 NOTE — Telephone Encounter (Signed)
IF there is an acute  loss of leg use then would seek Ed care

## 2020-11-28 NOTE — Telephone Encounter (Signed)
Ed more information  There are many causes of "weak legs" some of them are serious    I am not aware of a medicine per se that fixes it.  Advise any in person visit to evaluate to see what would be helpful.

## 2020-11-29 NOTE — Telephone Encounter (Signed)
I spoke with the pt and informed him of the message below. I offered a visit but the pt stated he would call back into the office to schedule an appointment.

## 2020-12-01 ENCOUNTER — Encounter: Payer: Self-pay | Admitting: Physical Medicine and Rehabilitation

## 2020-12-01 ENCOUNTER — Ambulatory Visit (HOSPITAL_COMMUNITY)
Admission: RE | Admit: 2020-12-01 | Discharge: 2020-12-01 | Disposition: A | Discharge status: Home / Self Care | Payer: Medicare Other | Source: Ambulatory Visit | Attending: Cardiology | Admitting: Cardiology

## 2020-12-01 ENCOUNTER — Ambulatory Visit: Payer: Self-pay

## 2020-12-01 ENCOUNTER — Ambulatory Visit (INDEPENDENT_AMBULATORY_CARE_PROVIDER_SITE_OTHER): Payer: Medicare Other | Admitting: Physical Medicine and Rehabilitation

## 2020-12-01 ENCOUNTER — Other Ambulatory Visit: Payer: Self-pay

## 2020-12-01 DIAGNOSIS — G8929 Other chronic pain: Secondary | ICD-10-CM | POA: Diagnosis not present

## 2020-12-01 DIAGNOSIS — M25511 Pain in right shoulder: Secondary | ICD-10-CM | POA: Diagnosis not present

## 2020-12-01 DIAGNOSIS — I5022 Chronic systolic (congestive) heart failure: Secondary | ICD-10-CM | POA: Diagnosis not present

## 2020-12-01 LAB — BASIC METABOLIC PANEL
Anion gap: 8 (ref 5–15)
BUN: 58 mg/dL — ABNORMAL HIGH (ref 8–23)
CO2: 23 mmol/L (ref 22–32)
Calcium: 8.7 mg/dL — ABNORMAL LOW (ref 8.9–10.3)
Chloride: 108 mmol/L (ref 98–111)
Creatinine, Ser: 1.86 mg/dL — ABNORMAL HIGH (ref 0.61–1.24)
GFR, Estimated: 34 mL/min — ABNORMAL LOW (ref >60)
Glucose, Bld: 259 mg/dL — ABNORMAL HIGH (ref 70–99)
Potassium: 4.9 mmol/L (ref 3.5–5.1)
Sodium: 139 mmol/L (ref 135–145)

## 2020-12-01 NOTE — Progress Notes (Signed)
Pt state right shoulder pain. Pt state when he lift his right shoulder the pai gets worse. Pt state he takes over the counter pain meds to help ease his pain.  Numeric Pain Rating Scale and Functional Assessment Average Pain 7   In the last MONTH (on 0-10 scale) has pain interfered with the following?  1. General activity like being  able to carry out your everyday physical activities such as walking, climbing stairs, carrying groceries, or moving a chair?  Rating(9)    +BT, -Dye Allergies.

## 2020-12-01 NOTE — Progress Notes (Signed)
   [name] - 87 y.o. male MRN [id number]  Date of birth: 1929/02/13  Office Visit Note: Visit Date: 12/01/2020 PCP: Burnis Medin, MD Referred by: Burnis Medin, MD  Subjective: Chief Complaint  Patient presents with   Right Shoulder - Pain   HPI:  [name] is a 87 y.o. male who comes in today at the request of Dr. Eunice Blase for planned Right anesthetic glenohumeral arthrogram with fluoroscopic guidance.  The patient has failed conservative care including home exercise, medications, time and activity modification.  This injection will be diagnostic and hopefully therapeutic.  Please see requesting physician notes for further details and justification.   ROS Otherwise per HPI.  Assessment & Plan: Visit Diagnoses:    ICD-10-CM   1. Chronic right shoulder pain  M25.511 XR C-ARM NO REPORT   G89.29 Large Joint Inj: R glenohumeral      Plan: No additional findings.   Meds & Orders: No orders of the defined types were placed in this encounter.   Orders Placed This Encounter  Procedures   Large Joint Inj: R glenohumeral   XR C-ARM NO REPORT    Follow-up: Return if symptoms worsen or fail to improve.   Procedures: Large Joint Inj: R glenohumeral on 12/01/2020 10:19 AM Indications: pain and diagnostic evaluation Details: 22 G 3.5 in needle, fluoroscopy-guided anteromedial approach  Arthrogram: No  Medications: 3 mL bupivacaine 0.5 %; 60 mg triamcinolone acetonide 40 MG/ML Outcome: tolerated well, no immediate complications  There was excellent flow of contrast producing a partial arthrogram of the glenohumeral joint. The patient did have relief of symptoms during the anesthetic phase of the injection. Procedure, treatment alternatives, risks and benefits explained, specific risks discussed. Consent was given by the patient. Immediately prior to procedure a time out was called to verify the correct patient, procedure, equipment, support staff and site/side  marked as required. Patient was prepped and draped in the usual sterile fashion.         Clinical History: No specialty comments available.     Objective:  VS:  HT:    WT:   BMI:     BP:   HR: bpm  TEMP: ( )  RESP:  Physical Exam   Imaging: No results found.

## 2020-12-04 MED ORDER — BUPIVACAINE HCL 0.5 % IJ SOLN
3.0000 mL | INTRAMUSCULAR | Status: AC | PRN
  Administered 2020-12-01: 3 mL via INTRA_ARTICULAR

## 2020-12-04 MED ORDER — TRIAMCINOLONE ACETONIDE 40 MG/ML IJ SUSP
60.0000 mg | INTRAMUSCULAR | Status: AC | PRN
  Administered 2020-12-01: 60 mg via INTRA_ARTICULAR

## 2020-12-05 ENCOUNTER — Other Ambulatory Visit: Payer: Self-pay | Admitting: Internal Medicine

## 2020-12-23 DIAGNOSIS — Z23 Encounter for immunization: Secondary | ICD-10-CM | POA: Diagnosis not present

## 2020-12-25 ENCOUNTER — Other Ambulatory Visit: Payer: Self-pay

## 2020-12-25 ENCOUNTER — Inpatient Hospital Stay (HOSPITAL_BASED_OUTPATIENT_CLINIC_OR_DEPARTMENT_OTHER)
Admission: EM | Admit: 2020-12-25 | Discharge: 2020-12-27 | DRG: 291 | Disposition: A | Discharge status: Home Health | Payer: No Typology Code available for payment source | Attending: Family Medicine | Admitting: Family Medicine

## 2020-12-25 ENCOUNTER — Encounter (HOSPITAL_BASED_OUTPATIENT_CLINIC_OR_DEPARTMENT_OTHER): Payer: Self-pay | Admitting: Emergency Medicine

## 2020-12-25 ENCOUNTER — Emergency Department (HOSPITAL_BASED_OUTPATIENT_CLINIC_OR_DEPARTMENT_OTHER): Payer: No Typology Code available for payment source

## 2020-12-25 DIAGNOSIS — E7800 Pure hypercholesterolemia, unspecified: Secondary | ICD-10-CM | POA: Diagnosis present

## 2020-12-25 DIAGNOSIS — N1832 Chronic kidney disease, stage 3b: Secondary | ICD-10-CM | POA: Diagnosis present

## 2020-12-25 DIAGNOSIS — I50813 Acute on chronic right heart failure: Secondary | ICD-10-CM

## 2020-12-25 DIAGNOSIS — I2510 Atherosclerotic heart disease of native coronary artery without angina pectoris: Secondary | ICD-10-CM | POA: Diagnosis present

## 2020-12-25 DIAGNOSIS — F32A Depression, unspecified: Secondary | ICD-10-CM | POA: Diagnosis present

## 2020-12-25 DIAGNOSIS — G4733 Obstructive sleep apnea (adult) (pediatric): Secondary | ICD-10-CM | POA: Diagnosis present

## 2020-12-25 DIAGNOSIS — I130 Hypertensive heart and chronic kidney disease with heart failure and stage 1 through stage 4 chronic kidney disease, or unspecified chronic kidney disease: Principal | ICD-10-CM | POA: Diagnosis present

## 2020-12-25 DIAGNOSIS — Z79899 Other long term (current) drug therapy: Secondary | ICD-10-CM

## 2020-12-25 DIAGNOSIS — Z8249 Family history of ischemic heart disease and other diseases of the circulatory system: Secondary | ICD-10-CM

## 2020-12-25 DIAGNOSIS — E1165 Type 2 diabetes mellitus with hyperglycemia: Secondary | ICD-10-CM

## 2020-12-25 DIAGNOSIS — D631 Anemia in chronic kidney disease: Secondary | ICD-10-CM | POA: Diagnosis present

## 2020-12-25 DIAGNOSIS — E1169 Type 2 diabetes mellitus with other specified complication: Secondary | ICD-10-CM | POA: Diagnosis present

## 2020-12-25 DIAGNOSIS — I480 Paroxysmal atrial fibrillation: Secondary | ICD-10-CM | POA: Diagnosis present

## 2020-12-25 DIAGNOSIS — I350 Nonrheumatic aortic (valve) stenosis: Secondary | ICD-10-CM | POA: Diagnosis present

## 2020-12-25 DIAGNOSIS — Z85828 Personal history of other malignant neoplasm of skin: Secondary | ICD-10-CM

## 2020-12-25 DIAGNOSIS — R778 Other specified abnormalities of plasma proteins: Secondary | ICD-10-CM | POA: Diagnosis present

## 2020-12-25 DIAGNOSIS — I252 Old myocardial infarction: Secondary | ICD-10-CM

## 2020-12-25 DIAGNOSIS — G2581 Restless legs syndrome: Secondary | ICD-10-CM | POA: Diagnosis present

## 2020-12-25 DIAGNOSIS — R0902 Hypoxemia: Secondary | ICD-10-CM | POA: Diagnosis present

## 2020-12-25 DIAGNOSIS — Z95810 Presence of automatic (implantable) cardiac defibrillator: Secondary | ICD-10-CM

## 2020-12-25 DIAGNOSIS — I10 Essential (primary) hypertension: Secondary | ICD-10-CM | POA: Diagnosis present

## 2020-12-25 DIAGNOSIS — U071 COVID-19: Secondary | ICD-10-CM | POA: Diagnosis present

## 2020-12-25 DIAGNOSIS — R7989 Other specified abnormal findings of blood chemistry: Secondary | ICD-10-CM | POA: Diagnosis present

## 2020-12-25 DIAGNOSIS — Z87891 Personal history of nicotine dependence: Secondary | ICD-10-CM

## 2020-12-25 DIAGNOSIS — Z794 Long term (current) use of insulin: Secondary | ICD-10-CM

## 2020-12-25 DIAGNOSIS — D649 Anemia, unspecified: Secondary | ICD-10-CM | POA: Diagnosis present

## 2020-12-25 DIAGNOSIS — Z7901 Long term (current) use of anticoagulants: Secondary | ICD-10-CM

## 2020-12-25 DIAGNOSIS — Z951 Presence of aortocoronary bypass graft: Secondary | ICD-10-CM

## 2020-12-25 DIAGNOSIS — Z66 Do not resuscitate: Secondary | ICD-10-CM | POA: Diagnosis present

## 2020-12-25 DIAGNOSIS — E1122 Type 2 diabetes mellitus with diabetic chronic kidney disease: Secondary | ICD-10-CM | POA: Diagnosis present

## 2020-12-25 DIAGNOSIS — I255 Ischemic cardiomyopathy: Secondary | ICD-10-CM | POA: Diagnosis present

## 2020-12-25 DIAGNOSIS — R0602 Shortness of breath: Secondary | ICD-10-CM

## 2020-12-25 DIAGNOSIS — I5082 Biventricular heart failure: Secondary | ICD-10-CM | POA: Diagnosis present

## 2020-12-25 DIAGNOSIS — I5043 Acute on chronic combined systolic (congestive) and diastolic (congestive) heart failure: Secondary | ICD-10-CM | POA: Diagnosis present

## 2020-12-25 LAB — COMPREHENSIVE METABOLIC PANEL
ALT: 52 U/L — ABNORMAL HIGH (ref 0–44)
AST: 45 U/L — ABNORMAL HIGH (ref 15–41)
Albumin: 3.1 g/dL — ABNORMAL LOW (ref 3.5–5.0)
Alkaline Phosphatase: 86 U/L (ref 38–126)
Anion gap: 8 (ref 5–15)
BUN: 53 mg/dL — ABNORMAL HIGH (ref 8–23)
CO2: 22 mmol/L (ref 22–32)
Calcium: 8.2 mg/dL — ABNORMAL LOW (ref 8.9–10.3)
Chloride: 108 mmol/L (ref 98–111)
Creatinine, Ser: 2.11 mg/dL — ABNORMAL HIGH (ref 0.61–1.24)
GFR, Estimated: 29 mL/min — ABNORMAL LOW (ref >60)
Glucose, Bld: 370 mg/dL — ABNORMAL HIGH (ref 70–99)
Potassium: 5.1 mmol/L (ref 3.5–5.1)
Sodium: 138 mmol/L (ref 135–145)
Total Bilirubin: 0.4 mg/dL (ref 0.3–1.2)
Total Protein: 5.7 g/dL — ABNORMAL LOW (ref 6.5–8.1)

## 2020-12-25 LAB — CBC WITH DIFFERENTIAL/PLATELET
Abs Immature Granulocytes: 0.07 10*3/uL (ref 0.00–0.07)
Basophils Absolute: 0 10*3/uL (ref 0.0–0.1)
Basophils Relative: 0 %
Eosinophils Absolute: 0 10*3/uL (ref 0.0–0.5)
Eosinophils Relative: 1 %
HCT: 31 % — ABNORMAL LOW (ref 39.0–52.0)
Hemoglobin: 9.4 g/dL — ABNORMAL LOW (ref 13.0–17.0)
Immature Granulocytes: 1 %
Lymphocytes Relative: 17 %
Lymphs Abs: 1.3 10*3/uL (ref 0.7–4.0)
MCH: 26.3 pg (ref 26.0–34.0)
MCHC: 30.3 g/dL (ref 30.0–36.0)
MCV: 86.6 fL (ref 80.0–100.0)
Monocytes Absolute: 0.6 10*3/uL (ref 0.1–1.0)
Monocytes Relative: 8 %
Neutro Abs: 5.5 10*3/uL (ref 1.7–7.7)
Neutrophils Relative %: 73 %
Platelets: 199 10*3/uL (ref 150–400)
RBC: 3.58 MIL/uL — ABNORMAL LOW (ref 4.22–5.81)
RDW: 16.3 % — ABNORMAL HIGH (ref 11.5–15.5)
WBC: 7.4 10*3/uL (ref 4.0–10.5)
nRBC: 0 % (ref 0.0–0.2)

## 2020-12-25 LAB — RESP PANEL BY RT-PCR (FLU A&B, COVID) ARPGX2
Influenza A by PCR: NEGATIVE
Influenza B by PCR: NEGATIVE
SARS Coronavirus 2 by RT PCR: POSITIVE — AB

## 2020-12-25 LAB — TROPONIN I (HIGH SENSITIVITY)
Troponin I (High Sensitivity): 35 ng/L — ABNORMAL HIGH (ref <18)
Troponin I (High Sensitivity): 37 ng/L — ABNORMAL HIGH (ref <18)

## 2020-12-25 LAB — BRAIN NATRIURETIC PEPTIDE: B Natriuretic Peptide: 2285.9 pg/mL — ABNORMAL HIGH (ref 0.0–100.0)

## 2020-12-25 MED ORDER — METHYLPREDNISOLONE SODIUM SUCC 125 MG IJ SOLR
125.0000 mg | Freq: Once | INTRAMUSCULAR | Status: AC | PRN
Start: 2020-12-25 — End: 2020-12-26

## 2020-12-25 MED ORDER — EPINEPHRINE 0.3 MG/0.3ML IJ SOAJ
0.3000 mg | Freq: Once | INTRAMUSCULAR | Status: AC | PRN
  Filled 2020-12-25: qty 0.3
  Filled 2020-12-25: qty 0.6

## 2020-12-25 MED ORDER — ALBUTEROL SULFATE HFA 108 (90 BASE) MCG/ACT IN AERS
2.0000 | INHALATION_SPRAY | Freq: Once | RESPIRATORY_TRACT | Status: DC | PRN
  Filled 2020-12-25: qty 6.7

## 2020-12-25 MED ORDER — NIRMATRELVIR/RITONAVIR (PAXLOVID) TABLET (RENAL DOSING): 2.0000 | ORAL_TABLET | Freq: Two times a day (BID) | ORAL | Status: DC

## 2020-12-25 MED ORDER — BEBTELOVIMAB 175 MG/2 ML IV (EUA)
175.0000 mg | Freq: Once | INTRAMUSCULAR | Status: AC
  Administered 2020-12-25: 175 mg via INTRAVENOUS
  Filled 2020-12-25: qty 2

## 2020-12-25 MED ORDER — DIPHENHYDRAMINE HCL 50 MG/ML IJ SOLN: 50.0000 mg | Freq: Once | INTRAMUSCULAR | Status: AC | PRN

## 2020-12-25 MED ORDER — SODIUM CHLORIDE 0.9 % IV SOLN: INTRAVENOUS | Status: AC | PRN

## 2020-12-25 MED ORDER — FAMOTIDINE IN NACL 20-0.9 MG/50ML-% IV SOLN
20.0000 mg | Freq: Once | INTRAVENOUS | Status: AC | PRN
  Filled 2020-12-25: qty 50

## 2020-12-25 MED ORDER — FUROSEMIDE 10 MG/ML IJ SOLN
80.0000 mg | Freq: Once | INTRAMUSCULAR | Status: AC
  Administered 2020-12-25: 80 mg via INTRAVENOUS
  Filled 2020-12-25: qty 8
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
## 2020-12-25 NOTE — ED Notes (Signed)
Medication information sheet provided for bebtelovimab injection.  Family to let me know when ready for patient to receive medication.

## 2020-12-25 NOTE — ED Triage Notes (Signed)
Pt's family at bedside reports pt started feeling unwell yesterday. Took home Covid test and it was positive. Has had shortness of breath since last night which is better with sitting upright in chair. Pt has bilateral rhonchi, but does not appear to be in distress. Denies cough, n/v/d.

## 2020-12-25 NOTE — ED Notes (Signed)
ED Provider at bedside. 

## 2020-12-25 NOTE — ED Notes (Signed)
Critical lab value Covid + reported to Dr. Ronnald Nian

## 2020-12-25 NOTE — ED Notes (Signed)
Medication given per order.  Patient and wife agreed to administration.  No s/s of reaction noted.  Tolerated well.  Encouraged to call for assistance as needed.

## 2020-12-26 ENCOUNTER — Encounter (HOSPITAL_COMMUNITY): Payer: Self-pay | Admitting: Family Medicine

## 2020-12-26 ENCOUNTER — Inpatient Hospital Stay (HOSPITAL_COMMUNITY): Payer: No Typology Code available for payment source

## 2020-12-26 ENCOUNTER — Telehealth: Payer: Self-pay | Admitting: Internal Medicine

## 2020-12-26 DIAGNOSIS — U071 COVID-19: Secondary | ICD-10-CM | POA: Diagnosis present

## 2020-12-26 DIAGNOSIS — Z85828 Personal history of other malignant neoplasm of skin: Secondary | ICD-10-CM | POA: Diagnosis not present

## 2020-12-26 DIAGNOSIS — I350 Nonrheumatic aortic (valve) stenosis: Secondary | ICD-10-CM | POA: Diagnosis present

## 2020-12-26 DIAGNOSIS — R0902 Hypoxemia: Secondary | ICD-10-CM | POA: Diagnosis present

## 2020-12-26 DIAGNOSIS — I50813 Acute on chronic right heart failure: Secondary | ICD-10-CM | POA: Diagnosis present

## 2020-12-26 DIAGNOSIS — E1169 Type 2 diabetes mellitus with other specified complication: Secondary | ICD-10-CM

## 2020-12-26 DIAGNOSIS — E782 Mixed hyperlipidemia: Secondary | ICD-10-CM | POA: Diagnosis not present

## 2020-12-26 DIAGNOSIS — Z794 Long term (current) use of insulin: Secondary | ICD-10-CM

## 2020-12-26 DIAGNOSIS — I480 Paroxysmal atrial fibrillation: Secondary | ICD-10-CM | POA: Diagnosis present

## 2020-12-26 DIAGNOSIS — E7800 Pure hypercholesterolemia, unspecified: Secondary | ICD-10-CM | POA: Diagnosis present

## 2020-12-26 DIAGNOSIS — I5082 Biventricular heart failure: Secondary | ICD-10-CM | POA: Diagnosis present

## 2020-12-26 DIAGNOSIS — E1122 Type 2 diabetes mellitus with diabetic chronic kidney disease: Secondary | ICD-10-CM | POA: Diagnosis present

## 2020-12-26 DIAGNOSIS — R778 Other specified abnormalities of plasma proteins: Secondary | ICD-10-CM | POA: Diagnosis not present

## 2020-12-26 DIAGNOSIS — D649 Anemia, unspecified: Secondary | ICD-10-CM | POA: Diagnosis present

## 2020-12-26 DIAGNOSIS — I10 Essential (primary) hypertension: Secondary | ICD-10-CM | POA: Diagnosis not present

## 2020-12-26 DIAGNOSIS — Z87891 Personal history of nicotine dependence: Secondary | ICD-10-CM | POA: Diagnosis not present

## 2020-12-26 DIAGNOSIS — I5043 Acute on chronic combined systolic (congestive) and diastolic (congestive) heart failure: Secondary | ICD-10-CM

## 2020-12-26 DIAGNOSIS — Z95810 Presence of automatic (implantable) cardiac defibrillator: Secondary | ICD-10-CM | POA: Diagnosis not present

## 2020-12-26 DIAGNOSIS — Z66 Do not resuscitate: Secondary | ICD-10-CM | POA: Diagnosis present

## 2020-12-26 DIAGNOSIS — I255 Ischemic cardiomyopathy: Secondary | ICD-10-CM | POA: Diagnosis present

## 2020-12-26 DIAGNOSIS — Z951 Presence of aortocoronary bypass graft: Secondary | ICD-10-CM | POA: Diagnosis not present

## 2020-12-26 DIAGNOSIS — E1165 Type 2 diabetes mellitus with hyperglycemia: Secondary | ICD-10-CM | POA: Diagnosis present

## 2020-12-26 DIAGNOSIS — R609 Edema, unspecified: Secondary | ICD-10-CM | POA: Diagnosis not present

## 2020-12-26 DIAGNOSIS — F32A Depression, unspecified: Secondary | ICD-10-CM | POA: Diagnosis present

## 2020-12-26 DIAGNOSIS — G4733 Obstructive sleep apnea (adult) (pediatric): Secondary | ICD-10-CM | POA: Diagnosis present

## 2020-12-26 DIAGNOSIS — N1832 Chronic kidney disease, stage 3b: Secondary | ICD-10-CM

## 2020-12-26 DIAGNOSIS — I130 Hypertensive heart and chronic kidney disease with heart failure and stage 1 through stage 4 chronic kidney disease, or unspecified chronic kidney disease: Secondary | ICD-10-CM | POA: Diagnosis present

## 2020-12-26 DIAGNOSIS — I2510 Atherosclerotic heart disease of native coronary artery without angina pectoris: Secondary | ICD-10-CM | POA: Diagnosis present

## 2020-12-26 DIAGNOSIS — I252 Old myocardial infarction: Secondary | ICD-10-CM | POA: Diagnosis not present

## 2020-12-26 DIAGNOSIS — G2581 Restless legs syndrome: Secondary | ICD-10-CM | POA: Diagnosis present

## 2020-12-26 LAB — COMPREHENSIVE METABOLIC PANEL
ALT: 50 U/L — ABNORMAL HIGH (ref 0–44)
AST: 33 U/L (ref 15–41)
Albumin: 2.8 g/dL — ABNORMAL LOW (ref 3.5–5.0)
Alkaline Phosphatase: 80 U/L (ref 38–126)
Anion gap: 8 (ref 5–15)
BUN: 50 mg/dL — ABNORMAL HIGH (ref 8–23)
CO2: 26 mmol/L (ref 22–32)
Calcium: 8.6 mg/dL — ABNORMAL LOW (ref 8.9–10.3)
Chloride: 106 mmol/L (ref 98–111)
Creatinine, Ser: 2.05 mg/dL — ABNORMAL HIGH (ref 0.61–1.24)
GFR, Estimated: 30 mL/min — ABNORMAL LOW (ref >60)
Glucose, Bld: 306 mg/dL — ABNORMAL HIGH (ref 70–99)
Potassium: 4.6 mmol/L (ref 3.5–5.1)
Sodium: 140 mmol/L (ref 135–145)
Total Bilirubin: 0.7 mg/dL (ref 0.3–1.2)
Total Protein: 5.6 g/dL — ABNORMAL LOW (ref 6.5–8.1)

## 2020-12-26 LAB — GLUCOSE, CAPILLARY
Glucose-Capillary: 258 mg/dL — ABNORMAL HIGH (ref 70–99)
Glucose-Capillary: 267 mg/dL — ABNORMAL HIGH (ref 70–99)
Glucose-Capillary: 158 mg/dL — ABNORMAL HIGH (ref 70–99)
Glucose-Capillary: 114 mg/dL — ABNORMAL HIGH (ref 70–99)
Glucose-Capillary: 138 mg/dL — ABNORMAL HIGH (ref 70–99)

## 2020-12-26 LAB — MAGNESIUM: Magnesium: 2.2 mg/dL (ref 1.7–2.4)

## 2020-12-26 LAB — HEMOGLOBIN A1C
Hgb A1c MFr Bld: 8.5 % — ABNORMAL HIGH (ref 4.8–5.6)
Mean Plasma Glucose: 197.25 mg/dL

## 2020-12-26 LAB — C-REACTIVE PROTEIN: CRP: 2.1 mg/dL — ABNORMAL HIGH (ref <1.0)

## 2020-12-26 LAB — PROCALCITONIN: Procalcitonin: 0.25 ng/mL

## 2020-12-26 LAB — DIGOXIN LEVEL: Digoxin Level: 0.2 ng/mL — ABNORMAL LOW (ref 0.8–2.0)

## 2020-12-26 MED ORDER — ALLOPURINOL 100 MG PO TABS
100.0000 mg | ORAL_TABLET | Freq: Every day | ORAL | Status: DC
  Administered 2020-12-26 – 2020-12-27 (×2): 100 mg via ORAL
  Filled 2020-12-26 (×2): qty 1

## 2020-12-26 MED ORDER — ACETAMINOPHEN 650 MG RE SUPP: 650.0000 mg | Freq: Four times a day (QID) | RECTAL | Status: DC | PRN

## 2020-12-26 MED ORDER — AMIODARONE HCL 200 MG PO TABS
100.0000 mg | ORAL_TABLET | Freq: Every day | ORAL | Status: DC
  Administered 2020-12-26 – 2020-12-27 (×2): 100 mg via ORAL
  Filled 2020-12-26 (×2): qty 1

## 2020-12-26 MED ORDER — ESCITALOPRAM OXALATE 10 MG PO TABS
10.0000 mg | ORAL_TABLET | Freq: Every day | ORAL | Status: DC
  Administered 2020-12-26 – 2020-12-27 (×2): 10 mg via ORAL
  Filled 2020-12-26 (×2): qty 1

## 2020-12-26 MED ORDER — HYDRALAZINE HCL 20 MG/ML IJ SOLN: 10.0000 mg | Freq: Four times a day (QID) | INTRAMUSCULAR | Status: DC | PRN

## 2020-12-26 MED ORDER — SODIUM CHLORIDE 0.9 % IV SOLN
100.0000 mg | Freq: Every day | INTRAVENOUS | Status: DC
  Administered 2020-12-27: 100 mg via INTRAVENOUS
  Filled 2020-12-26 (×2): qty 20

## 2020-12-26 MED ORDER — SPIRONOLACTONE 25 MG PO TABS
25.0000 mg | ORAL_TABLET | Freq: Every day | ORAL | Status: DC
  Administered 2020-12-26 – 2020-12-27 (×2): 25 mg via ORAL
  Filled 2020-12-26 (×2): qty 1

## 2020-12-26 MED ORDER — BUSPIRONE HCL 5 MG PO TABS: 5.0000 mg | ORAL_TABLET | Freq: Two times a day (BID) | ORAL | Status: DC

## 2020-12-26 MED ORDER — ACETAMINOPHEN 325 MG PO TABS
650.0000 mg | ORAL_TABLET | Freq: Four times a day (QID) | ORAL | Status: DC | PRN
  Administered 2020-12-26: 650 mg via ORAL
  Filled 2020-12-26: qty 2

## 2020-12-26 MED ORDER — POLYETHYLENE GLYCOL 3350 17 G PO PACK: 17.0000 g | PACK | Freq: Every day | ORAL | Status: DC | PRN

## 2020-12-26 MED ORDER — APIXABAN 2.5 MG PO TABS
2.5000 mg | ORAL_TABLET | Freq: Two times a day (BID) | ORAL | Status: DC
  Administered 2020-12-26 – 2020-12-27 (×4): 2.5 mg via ORAL
  Filled 2020-12-26 (×4): qty 1

## 2020-12-26 MED ORDER — TORSEMIDE 20 MG PO TABS
20.0000 mg | ORAL_TABLET | Freq: Every day | ORAL | Status: DC
  Administered 2020-12-27: 20 mg via ORAL
  Filled 2020-12-26: qty 1

## 2020-12-26 MED ORDER — ATORVASTATIN CALCIUM 40 MG PO TABS
40.0000 mg | ORAL_TABLET | Freq: Every day | ORAL | Status: DC
  Administered 2020-12-26 – 2020-12-27 (×2): 40 mg via ORAL
  Filled 2020-12-26 (×2): qty 1

## 2020-12-26 MED ORDER — ONDANSETRON HCL 4 MG/2ML IJ SOLN: 4.0000 mg | Freq: Four times a day (QID) | INTRAMUSCULAR | Status: DC | PRN

## 2020-12-26 MED ORDER — INSULIN GLARGINE-YFGN 100 UNIT/ML ~~LOC~~ SOLN
30.0000 [IU] | Freq: Every day | SUBCUTANEOUS | Status: DC
  Administered 2020-12-26 – 2020-12-27 (×2): 30 [IU] via SUBCUTANEOUS
  Filled 2020-12-26 (×3): qty 0.3

## 2020-12-26 MED ORDER — BENZONATATE 100 MG PO CAPS: 100.0000 mg | ORAL_CAPSULE | Freq: Three times a day (TID) | ORAL | Status: DC | PRN

## 2020-12-26 MED ORDER — BUSPIRONE HCL 5 MG PO TABS
5.0000 mg | ORAL_TABLET | Freq: Two times a day (BID) | ORAL | Status: DC
  Administered 2020-12-26 – 2020-12-27 (×2): 5 mg via ORAL
  Filled 2020-12-26 (×2): qty 1

## 2020-12-26 MED ORDER — ONDANSETRON HCL 4 MG PO TABS: 4.0000 mg | ORAL_TABLET | Freq: Four times a day (QID) | ORAL | Status: DC | PRN

## 2020-12-26 MED ORDER — FUROSEMIDE 10 MG/ML IJ SOLN
60.0000 mg | Freq: Two times a day (BID) | INTRAMUSCULAR | Status: DC
  Administered 2020-12-26: 60 mg via INTRAVENOUS
  Filled 2020-12-26: qty 6

## 2020-12-26 MED ORDER — DIGOXIN 125 MCG PO TABS
0.0625 mg | ORAL_TABLET | ORAL | Status: DC
  Administered 2020-12-26: 0.0625 mg via ORAL
  Filled 2020-12-26: qty 1

## 2020-12-26 MED ORDER — HYDRALAZINE HCL 25 MG PO TABS
37.7500 mg | ORAL_TABLET | Freq: Three times a day (TID) | ORAL | Status: DC
  Administered 2020-12-26: 37.75 mg via ORAL
  Filled 2020-12-26: qty 2

## 2020-12-26 MED ORDER — HYDRALAZINE HCL 25 MG PO TABS
37.5000 mg | ORAL_TABLET | Freq: Three times a day (TID) | ORAL | Status: DC
  Administered 2020-12-27: 37.5 mg via ORAL
  Filled 2020-12-26 (×3): qty 2

## 2020-12-26 MED ORDER — INSULIN ASPART 100 UNIT/ML IJ SOLN
0.0000 [IU] | Freq: Three times a day (TID) | INTRAMUSCULAR | Status: DC
  Administered 2020-12-26: 8 [IU] via SUBCUTANEOUS
  Administered 2020-12-26 – 2020-12-27 (×3): 3 [IU] via SUBCUTANEOUS

## 2020-12-26 MED ORDER — CARVEDILOL 12.5 MG PO TABS
12.5000 mg | ORAL_TABLET | Freq: Two times a day (BID) | ORAL | Status: DC
  Administered 2020-12-26 (×2): 12.5 mg via ORAL
  Filled 2020-12-26 (×2): qty 1

## 2020-12-26 MED ORDER — TAMSULOSIN HCL 0.4 MG PO CAPS
0.4000 mg | ORAL_CAPSULE | Freq: Every day | ORAL | Status: DC
  Administered 2020-12-26 – 2020-12-27 (×2): 0.4 mg via ORAL
  Filled 2020-12-26 (×2): qty 1

## 2020-12-26 MED ORDER — SODIUM CHLORIDE 0.9 % IV SOLN
200.0000 mg | Freq: Once | INTRAVENOUS | Status: AC
  Administered 2020-12-26: 200 mg via INTRAVENOUS
  Filled 2020-12-26: qty 40

## 2020-12-26 MED ORDER — ISOSORBIDE MONONITRATE ER 60 MG PO TB24
60.0000 mg | ORAL_TABLET | Freq: Every day | ORAL | Status: DC
  Administered 2020-12-26: 60 mg via ORAL
  Filled 2020-12-26 (×2): qty 1

## 2020-12-26 MED ORDER — ALBUTEROL SULFATE HFA 108 (90 BASE) MCG/ACT IN AERS
2.0000 | INHALATION_SPRAY | RESPIRATORY_TRACT | Status: DC | PRN
  Filled 2020-12-26: qty 6.7

## 2020-12-26 MED ORDER — GABAPENTIN 100 MG PO CAPS
100.0000 mg | ORAL_CAPSULE | Freq: Three times a day (TID) | ORAL | Status: DC
  Administered 2020-12-26 – 2020-12-27 (×6): 100 mg via ORAL
  Filled 2020-12-26 (×6): qty 1
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
## 2020-12-26 NOTE — Evaluation (Signed)
Physical Therapy Evaluation Patient Details Name: [name] MRN: [id number] DOB: January 22, 1929 Today's Date: 12/26/2020  History of Present Illness  Pt is a 87 y.o. M who presents with generalized malaise and COVID-19 PCR testing found to be positive. Chest x-ray revealed patching bilateral lower lobe infiltrates. Significant PMH: CAD s/p CABG, CKD stage IIIb, DM2, ischemic cardiomyopathy with systolic and diastolic congestive heart failure, moderate aortic stenosis, paroxysmal atrial fibrillation, HTN, gout.  Clinical Impression  Pt admitted with above. He reports general malaise, however, is agreeable to PT evaluation. Pt overall moving fairly well, ambulating 90 feet with a walker at a min guard assist level. SpO2 94-97% on RA, HR 70-80's, intermittent afib. Instructed pt on IS use and pt performed x 10 (pulling 1,000 mL). Would benefit from follow up HHPT to address balance training, strengthening and functional mobility.      Recommendations for follow up therapy are one component of a multi-disciplinary discharge planning process, led by the attending physician.  Recommendations may be updated based on patient status, additional functional criteria and insurance authorization.  Follow Up Recommendations Home health PT    Equipment Recommendations  None recommended by PT (pt well equipped)    Recommendations for Other Services    OT Consult   Precautions / Restrictions Precautions Precautions: Fall Restrictions Weight Bearing Restrictions: No      Mobility  Bed Mobility Overal bed mobility: Modified Independent                  Transfers Overall transfer level: Needs assistance Equipment used: Rolling walker (2 wheeled) Transfers: Sit to/from Stand Sit to Stand: Min guard            Ambulation/Gait Ambulation/Gait assistance: Min guard Gait Distance (Feet): 90 Feet Assistive device: Rolling walker (2 wheeled) Gait Pattern/deviations: Step-through  pattern;Decreased stride length;Trunk flexed Gait velocity: decreased   General Gait Details: slow and steady pace, min guard for safety, no overt LOB noted  Stairs            Wheelchair Mobility    Modified Rankin (Stroke Patients Only)       Balance Overall balance assessment: Mild deficits observed, not formally tested                                           Pertinent Vitals/Pain Pain Assessment: No/denies pain    Home Living Family/patient expects to be discharged to:: Private residence Living Arrangements: Spouse/significant other Available Help at Discharge: Family Type of Home: House Home Access: Ramped entrance     Home Layout: One level Home Equipment: Environmental consultant - 2 wheels;Walker - 4 wheels;Cane - single point;Shower seat;Wheelchair - manual      Prior Function Level of Independence: Independent with assistive device(s)         Comments: using RW inside of the house, Rollator for limtied community ambulation     Hand Dominance        Extremity/Trunk Assessment   Upper Extremity Assessment Upper Extremity Assessment: Defer to OT evaluation    Lower Extremity Assessment Lower Extremity Assessment: Generalized weakness    Cervical / Trunk Assessment Cervical / Trunk Assessment: Kyphotic  Communication   Communication: No difficulties  Cognition Arousal/Alertness: Awake/alert Behavior During Therapy: WFL for tasks assessed/performed Overall Cognitive Status: Within Functional Limits for tasks assessed  General Comments      Exercises     Assessment/Plan    PT Assessment Patient needs continued PT services  PT Problem List Decreased strength;Decreased activity tolerance;Decreased balance;Decreased mobility       PT Treatment Interventions DME instruction;Gait training;Functional mobility training;Therapeutic activities;Therapeutic exercise;Balance  training;Patient/family education    PT Goals (Current goals can be found in the Care Plan section)  Acute Rehab PT Goals Patient Stated Goal: feel better PT Goal Formulation: With patient Time For Goal Achievement: 01/09/21 Potential to Achieve Goals: Good    Frequency Min 3X/week   Barriers to discharge        Co-evaluation               AM-PAC PT "6 Clicks" Mobility  Outcome Measure Help needed turning from your back to your side while in a flat bed without using bedrails?: None Help needed moving from lying on your back to sitting on the side of a flat bed without using bedrails?: None Help needed moving to and from a bed to a chair (including a wheelchair)?: A Little Help needed standing up from a chair using your arms (e.g., wheelchair or bedside chair)?: A Little Help needed to walk in hospital room?: A Little Help needed climbing 3-5 steps with a railing? : A Little 6 Click Score: 20    End of Session   Activity Tolerance: Patient tolerated treatment well Patient left: in chair;with call bell/phone within reach Nurse Communication: Mobility status PT Visit Diagnosis: Unsteadiness on feet (R26.81);Muscle weakness (generalized) (M62.81);Difficulty in walking, not elsewhere classified (R26.2)    Time: 6811-5726 PT Time Calculation (min) (ACUTE ONLY): 34 min   Charges:   PT Evaluation $PT Eval Moderate Complexity: 1 Mod PT Treatments $Therapeutic Activity: 8-22 mins        [name], PT, DPT Acute Rehabilitation Services Pager [phone] Office [phone]   [name] 12/26/2020, 1:03 PM

## 2020-12-26 NOTE — Assessment & Plan Note (Signed)
Resume patients home regimen or oral antihypertensives Titrate antihypertensive regimen as necessary to achieve adequate BP control PRN intravenous antihypertensives for excessively elevated blood pressure  

## 2020-12-26 NOTE — Telephone Encounter (Signed)
I spoke with the pt's wife and she stated that ht has been taken to the ED on 12/25/2020 due to his symptoms. Pt's wife stated that the pt tested positive for Covid on 12/24/2020. Pt's wife reported that the pt has been diagnosed with pneumonia since being admitted to the ED. Virtual visit was offered to the pt.

## 2020-12-26 NOTE — Progress Notes (Addendum)
PROGRESS NOTE    [name]  OMV:[id number] DOB: 1928-04-01 DOA: 12/25/2020 PCP: Burnis Medin, MD   Chief Complaint  Patient presents with   Shortness of Breath   Brief Narrative:  87 yo with hx CAD s/p CABG, CKD stage IIIb, T2DM, ischemic cardiomyopathy (EF 62-83%, grade 1 diastolic dysfunction), moderate AS, atrial fibrillation (s/p DCCV 05/2014), HTN, RLS, OSA and multiple other medical issues presenting to the hospital with Othar Curto positive covid test.    Assessment & Plan:   Principal Problem:   Acute on chronic combined systolic and diastolic CHF (congestive heart failure) ([place]) Active Problems:   Essential hypertension   Chronic kidney disease, stage 3b (HCC)   AF (paroxysmal atrial fibrillation) ([place])   Uncontrolled type 2 diabetes mellitus with hyperglycemia, with long-term current use of insulin ([place])   COVID-19 virus infection   Mixed diabetic hyperlipidemia associated with type 2 diabetes mellitus (HCC)   Elevated troponin level not due myocardial infarction  * Acute on chronic combined systolic and diastolic CHF (congestive heart failure) ([place]) Presented with bilateral LE edema, crackles Dry weight is 164 -> he's 169 lbs today.  BNP up, none in past several years, difficult to trend, unclear significance with CKD. S/p 80 mg lasix in ED, 60 x1 today, then will reevaluate tomorrow (he's on 20 mg torsemide qod at home) CXR with patchy consolidation in lung bases Repeat CXR 10/11 Echo 07/2020 with EF 20-25%, regional WMA, grade 1 diastolic dysfunction, low gradient moderate AV stenosis - see report Strict input and output monitoring Daily weights    COVID-19 virus infection May be contributing to symptoms CXR with patchy consolidation in lung bases, could be due to covid Home testing positive 10/8 Received monoclonal antibody 10/9 (bebtelovimab) He's now on remdesivir, will continue for now - borderline.  O2 has been fluctuating here - was 94-97% with therapy.  (Has  ranged from 91-96% on RA on my review of vitals - will hold off on steroids at this point given recently mostly above 94% and concern that symptoms at least in part due to heart failure.   Airborne and contact isolation initiated. Providing patient with supplemental oxygen for bouts of hypoxia with target oxygen saturation of 94%. Follow inflammatory markers  COVID-19 Labs  Recent Labs    12/26/20 0351  CRP 2.1*    Lab Results  Component Value Date   SARSCOV2NAA POSITIVE (Jeancarlos Marchena) 12/25/2020   [place] NEGATIVE 10/24/2018   Mechanical Fall  Head Trauma He denied head trauma, but has obvious bruising to head Will follow up again, may need head CT given he's on eliquis - no deficits appreciated on exam  Elevated troponin level not due myocardial infarction Troponin mildly elevated and flat Patient is chest pain-free Monitoring patient on telemetry   Uncontrolled type 2 diabetes mellitus with hyperglycemia, with long-term current use of insulin ([place]) Substantial hyperglycemia on arrival Patient been placed on Accu-Cheks before every meal and nightly with sliding scale insulin Continue home regimen of basal insulin therapy Hemoglobin A1C, 8.5 Diabetic Diet   AF (paroxysmal atrial fibrillation) (HCC) Currently rate controlled. Continue home regimen of Eliquis Continue home regimen of scheduled Coreg Monitoring on telemetry    Chronic kidney disease, stage 3b (HCC) Strict intake and output monitoring Creatinine near baseline Minimizing nephrotoxic agents as much as possible Serial chemistries to monitor renal function and electrolytes   Essential hypertension Resume patients home regimen or oral antihypertensives Titrate antihypertensive regimen as necessary to achieve adequate BP control PRN intravenous  antihypertensives for excessively elevated blood pressure    Mixed diabetic hyperlipidemia associated with type 2 diabetes mellitus ([place]) Continuing home regimen of lipid  lowering therapy.   DVT prophylaxis: eliquis Code Status: dnr Family Communication: wife over phone Disposition:   Status is: Inpatient  Remains inpatient appropriate because:Inpatient level of care appropriate due to severity of illness  Dispo: The patient is from: Home              Anticipated d/c is to: Home              Patient currently is not medically stable to d/c.   Difficult to place patient No       Consultants:  none  Procedures:  none  Antimicrobials: Anti-infectives (From admission, onward)    Start     Dose/Rate Route Frequency Ordered Stop   12/27/20 1000  remdesivir 100 mg in sodium chloride 0.9 % 100 mL IVPB       See Hyperspace for full Linked Orders Report.   100 mg 200 mL/hr over 30 Minutes Intravenous Daily 12/26/20 0616 12/31/20 0959   12/26/20 0800  remdesivir 200 mg in sodium chloride 0.9% 250 mL IVPB       See Hyperspace for full Linked Orders Report.   200 mg 580 mL/hr over 30 Minutes Intravenous Once 12/26/20 0616 12/26/20 1055   12/25/20 2200  nirmatrelvir/ritonavir EUA (renal dosing) (PAXLOVID) 2 tablet  Status:  Discontinued        2 tablet Oral 2 times daily 12/25/20 1913 12/25/20 1947          Subjective: Says his dry weight is about 164 No complaints  Objective: Vitals:   12/26/20 0000 12/26/20 0122 12/26/20 0547 12/26/20 1019  BP: 120/70 119/67 116/67 103/60  Pulse:  88 78 77  Resp: 17 19 18 18   Temp:  97.6 F (36.4 C) (!) 97.5 F (36.4 C) (!) 97.3 F (36.3 C)  TempSrc:  Oral Oral Oral  SpO2:  95% 94% 96%  Weight:  76.7 kg    Height:  6\' 2"  (1.88 m)      Intake/Output Summary (Last 24 hours) at 12/26/2020 1549 Last data filed at 12/26/2020 0600 Gross per 24 hour  Intake 460 ml  Output 3770 ml  Net -3310 ml   Filed Weights   12/25/20 1708 12/26/20 0122  Weight: 83.8 kg 76.7 kg    Examination:  General exam: Appears calm and comfortable  Respiratory system: coarse breath sounds Cardiovascular system: S1  & S2 heard, RRR. Gastrointestinal system: Abdomen is nondistended, soft and nontender.  Central nervous system: Alert and oriented. No focal neurological deficits. Extremities: LLE edema >RLE (he notes this is chronic)   Data Reviewed: I have personally reviewed following labs and imaging studies  CBC: Recent Labs  Lab 12/25/20 1722  WBC 7.4  NEUTROABS 5.5  HGB 9.4*  HCT 31.0*  MCV 86.6  PLT 938    Basic Metabolic Panel: Recent Labs  Lab 12/25/20 1722 12/26/20 0351  NA 138 140  K 5.1 4.6  CL 108 106  CO2 22 26  GLUCOSE 370* 306*  BUN 53* 50*  CREATININE 2.11* 2.05*  CALCIUM 8.2* 8.6*  MG  --  2.2    GFR: Estimated Creatinine Clearance: 26 mL/min (Renly Guedes) (by C-G formula based on SCr of 2.05 mg/dL (H)).  Liver Function Tests: Recent Labs  Lab 12/25/20 1722 12/26/20 0351  AST 45* 33  ALT 52* 50*  ALKPHOS 86 80  BILITOT 0.4  0.7  PROT 5.7* 5.6*  ALBUMIN 3.1* 2.8*    CBG: Recent Labs  Lab 12/26/20 0144 12/26/20 0624 12/26/20 1133  GLUCAP 258* 267* 158*     Recent Results (from the past 240 hour(s))  Resp Panel by RT-PCR (Flu Josilynn Losh&B, Covid)     Status: Abnormal   Collection Time: 12/25/20  8:26 PM  Result Value Ref Range Status   SARS Coronavirus 2 by RT PCR POSITIVE (Quanisha Drewry) NEGATIVE Final    Comment: RESULT CALLED TO, READ BACK BY AND VERIFIED WITH:  MALLARD,Alexandar Weisenberger RN @2118  12/25/20 ED[place] (NOTE) SARS-CoV-2 target nucleic acids are DETECTED.  The SARS-CoV-2 RNA is generally detectable in upper respiratory specimens during the acute phase of infection. Positive results are indicative of the presence of the identified virus, but do not rule out bacterial infection or co-infection with other pathogens not detected by the test. Clinical correlation with patient history and other diagnostic information is necessary to determine patient infection status. The expected result is Negative.  Fact Sheet for Patients: EntrepreneurPulse.com.au  Fact  Sheet for Healthcare Providers: IncredibleEmployment.be  This test is not yet approved or cleared by the [place] FDA and  has been authorized for detection and/or diagnosis of SARS-CoV-2 by FDA under an Emergency Use Authorization (EUA).  This EUA will remain in effect (meaning this test can  be used) for the duration of  the COVID-19 declaration under Section 564(b)(1) of the Act, 21 U.S.C. section 360bbb-3(b)(1), unless the authorization is terminated or revoked sooner.     Influenza [place] Early by PCR NEGATIVE NEGATIVE Final   Influenza B by PCR NEGATIVE NEGATIVE Final    Comment: (NOTE) The Xpert Xpress SARS-CoV-2/FLU/RSV plus assay is intended as an aid in the diagnosis of influenza from Nasopharyngeal swab specimens and should not be used as Ethyle Tiedt sole basis for treatment. Nasal washings and aspirates are unacceptable for Xpert Xpress SARS-CoV-2/FLU/RSV testing.  Fact Sheet for Patients: EntrepreneurPulse.com.au  Fact Sheet for Healthcare Providers: IncredibleEmployment.be  This test is not yet approved or cleared by the [place] FDA and has been authorized for detection and/or diagnosis of SARS-CoV-2 by FDA under an Emergency Use Authorization (EUA). This EUA will remain in effect (meaning this test can be used) for the duration of the COVID-19 declaration under Section 564(b)(1) of the Act, 21 U.S.C. section 360bbb-3(b)(1), unless the authorization is terminated or revoked.  Performed at [facility], [address] [place], [place] [address]          Radiology Studies: DG Chest Portable 1 View  Result Date: 12/25/2020 CLINICAL DATA:  COVID positive. Shortness of breath. EXAM: PORTABLE CHEST 1 VIEW COMPARISON:  January 2020 FINDINGS: Cardiac pacemaker and postsurgical changes are stable. Enlarged cardiac silhouette. Calcific atherosclerotic disease and tortuosity of the aorta. Subtle patchy areas of  airspace consolidation in the lung bases. Osseous structures are without acute abnormality. Soft tissues are grossly normal. IMPRESSION: Subtle patchy areas of airspace consolidation in the lung bases may represent early atypical pneumonia. Electronically Signed   By: Fidela Salisbury M.D.   On: 12/25/2020 18:09        Scheduled Meds:  allopurinol  100 mg Oral Daily   amiodarone  100 mg Oral Daily   apixaban  2.5 mg Oral BID   atorvastatin  40 mg Oral Daily   carvedilol  12.5 mg Oral BID WC   digoxin  0.0625 mg Oral Once per day on Mon Wed Fri   escitalopram  10 mg Oral Daily  furosemide  60 mg Intravenous BID   gabapentin  100 mg Oral TID   hydrALAZINE  37.75 mg Oral Q8H   insulin aspart  0-15 Units Subcutaneous TID AC & HS   insulin glargine-yfgn  30 Units Subcutaneous QHS   isosorbide mononitrate  60 mg Oral Daily   spironolactone  25 mg Oral Daily   tamsulosin  0.4 mg Oral Daily   Continuous Infusions:  sodium chloride     famotidine (PEPCID) IV     [START ON 12/27/2020] remdesivir 100 mg in NS 100 mL       LOS: 0 days    Time spent: over 30 min    Fayrene Helper, MD [facility]   To contact the attending provider between 7A-7P or the covering provider during after hours 7P-7A, please log into the web site [email or web address] and access using universal [place] password for that web site. If you do not have the password, please call the hospital operator.  12/26/2020, 3:49 PM

## 2020-12-26 NOTE — Assessment & Plan Note (Signed)
   Patient's symptoms are likely at least in part secondary to concurrent COVID-19 infection.   Home imaging testing found to be positive on 10/8 according to the patient.  COVID-19 PCR testing positive.  Airborne and contact isolation initiated.  Providing patient with supplemental oxygen for bouts of hypoxia with target oxygen saturation of 94%.  Patient has received a dose of monoclonal antibody while in the emergency department  Pharmacy consultation placed for remdesivir.  Steroids not indicated at this time.  As needed bronchodilator therapy for shortness of breath and wheezing   As needed antitussives for cough   Following CRP, D-dimer, procalcitonin and CBC daily  Close clinical monitoring

## 2020-12-26 NOTE — Assessment & Plan Note (Signed)
   Slightly elevated troponin with flat trajectory of elevation making plaque rupture unlikely.  Patient is chest pain-free  Monitoring patient on telemetry

## 2020-12-26 NOTE — Telephone Encounter (Signed)
I spoke with the pt's wife and she stated that ht has been taken to the ED on 12/25/2020 due to his symptoms and is currently admitted to ED care.  Pt's wife stated that the pt tested positive for Covid on 12/24/2020. Pt's wife reported that the pt has been diagnosed with pneumonia since being admitted to the ED. Virtual visit was offered to the pt.

## 2020-12-26 NOTE — Assessment & Plan Note (Signed)
   Patient appears to be going back forth from atrial flutter and fibrillation.  Currently rate controlled.  Continue home regimen of Eliquis  Continue home regimen of scheduled Coreg  Monitoring on telemetry

## 2020-12-26 NOTE — Assessment & Plan Note (Signed)
.   Continuing home regimen of lipid lowering therapy.  

## 2020-12-26 NOTE — Progress Notes (Addendum)
New Admission Note:    Arrival Method: via carelink from [facility] Mental Orientation:   A&O X4 Telemetry: Box 9M 08 Assessment: Refer to flowsheet IV:   Right AC Saline Lock Pain:  0/10 Tubes: none Safety Measures: Safety Fall Prevention Plan has been given, discussed with patient Admission: Completed [facility] [place] Orientation: Patient has been orientated to the room, unit and staff.  Family:      None at bedside   Orders have been reviewed and implemented. Will continue to monitor the patient. Call light has been placed within reach and bed alarm has been activated.

## 2020-12-26 NOTE — Assessment & Plan Note (Signed)
Strict intake and output monitoring Creatinine near baseline Minimizing nephrotoxic agents as much as possible Serial chemistries to monitor renal function and electrolytes  

## 2020-12-26 NOTE — Telephone Encounter (Signed)
Patient's daughter called after hours nurse line on 12/24/20.  She stated patient tested positive for Covid--he has a cough, runny nose and feels weak.

## 2020-12-26 NOTE — Assessment & Plan Note (Signed)
.   Substantial hyperglycemia on arrival . Patient been placed on Accu-Cheks before every meal and nightly with sliding scale insulin . Continue home regimen of basal insulin therapy . Hemoglobin A1C ordered . Diabetic Diet

## 2020-12-26 NOTE — Assessment & Plan Note (Signed)
.   Patient presenting with concerning symptoms such as breath, increasing peripheral edema, dry nonproductive cough . Patient additionally found to have physical exam findings such as markedly elevated jugular venous pulse and pitting edema of the lower extremities with significant pulmonary rales as well as radiographic findings of patchy bibasilar infiltrates.   . These physical exam findings and symptoms are suggestive of acute systolic decompensated congestive heart failure . Patient will require hospitalization for initiation of intravenous diuretics, will begin with IV Lasix 60 mg BID . Strict input and output monitoring . Daily weights . Monitoring of renal function and electrolytes with serial chemistries

## 2020-12-27 ENCOUNTER — Inpatient Hospital Stay (HOSPITAL_COMMUNITY): Payer: No Typology Code available for payment source

## 2020-12-27 DIAGNOSIS — R609 Edema, unspecified: Secondary | ICD-10-CM

## 2020-12-27 LAB — C-REACTIVE PROTEIN: CRP: 2.4 mg/dL — ABNORMAL HIGH (ref <1.0)

## 2020-12-27 LAB — CBC WITH DIFFERENTIAL/PLATELET
Abs Immature Granulocytes: 0.1 10*3/uL — ABNORMAL HIGH (ref 0.00–0.07)
Basophils Absolute: 0 10*3/uL (ref 0.0–0.1)
Basophils Relative: 0 %
Eosinophils Absolute: 0.1 10*3/uL (ref 0.0–0.5)
Eosinophils Relative: 1 %
HCT: 32.6 % — ABNORMAL LOW (ref 39.0–52.0)
Hemoglobin: 9.7 g/dL — ABNORMAL LOW (ref 13.0–17.0)
Immature Granulocytes: 2 %
Lymphocytes Relative: 27 %
Lymphs Abs: 1.8 10*3/uL (ref 0.7–4.0)
MCH: 25.6 pg — ABNORMAL LOW (ref 26.0–34.0)
MCHC: 29.8 g/dL — ABNORMAL LOW (ref 30.0–36.0)
MCV: 86 fL (ref 80.0–100.0)
Monocytes Absolute: 0.6 10*3/uL (ref 0.1–1.0)
Monocytes Relative: 9 %
Neutro Abs: 3.9 10*3/uL (ref 1.7–7.7)
Neutrophils Relative %: 61 %
Platelets: 192 10*3/uL (ref 150–400)
RBC: 3.79 MIL/uL — ABNORMAL LOW (ref 4.22–5.81)
RDW: 16.1 % — ABNORMAL HIGH (ref 11.5–15.5)
WBC: 6.5 10*3/uL (ref 4.0–10.5)
nRBC: 0 % (ref 0.0–0.2)

## 2020-12-27 LAB — COMPREHENSIVE METABOLIC PANEL
ALT: 40 U/L (ref 0–44)
AST: 24 U/L (ref 15–41)
Albumin: 2.7 g/dL — ABNORMAL LOW (ref 3.5–5.0)
Alkaline Phosphatase: 79 U/L (ref 38–126)
Anion gap: 9 (ref 5–15)
BUN: 49 mg/dL — ABNORMAL HIGH (ref 8–23)
CO2: 26 mmol/L (ref 22–32)
Calcium: 8.1 mg/dL — ABNORMAL LOW (ref 8.9–10.3)
Chloride: 104 mmol/L (ref 98–111)
Creatinine, Ser: 2.05 mg/dL — ABNORMAL HIGH (ref 0.61–1.24)
GFR, Estimated: 30 mL/min — ABNORMAL LOW (ref >60)
Glucose, Bld: 92 mg/dL (ref 70–99)
Potassium: 4.4 mmol/L (ref 3.5–5.1)
Sodium: 139 mmol/L (ref 135–145)
Total Bilirubin: 0.5 mg/dL (ref 0.3–1.2)
Total Protein: 5.2 g/dL — ABNORMAL LOW (ref 6.5–8.1)

## 2020-12-27 LAB — MAGNESIUM: Magnesium: 2 mg/dL (ref 1.7–2.4)

## 2020-12-27 LAB — D-DIMER, QUANTITATIVE: D-Dimer, Quant: 0.68 ug/mL-FEU — ABNORMAL HIGH (ref 0.00–0.50)

## 2020-12-27 LAB — GLUCOSE, CAPILLARY
Glucose-Capillary: 139 mg/dL — ABNORMAL HIGH (ref 70–99)
Glucose-Capillary: 162 mg/dL — ABNORMAL HIGH (ref 70–99)
Glucose-Capillary: 166 mg/dL — ABNORMAL HIGH (ref 70–99)
Glucose-Capillary: 66 mg/dL — ABNORMAL LOW (ref 70–99)

## 2020-12-27 LAB — BRAIN NATRIURETIC PEPTIDE: B Natriuretic Peptide: 2773.2 pg/mL — ABNORMAL HIGH (ref 0.0–100.0)

## 2020-12-27 MED ORDER — TORSEMIDE 20 MG PO TABS: ORAL_TABLET | ORAL | 0 refills | Status: DC

## 2020-12-27 MED ORDER — INSULIN GLARGINE-YFGN 100 UNIT/ML ~~LOC~~ SOLN
23.0000 [IU] | Freq: Every day | SUBCUTANEOUS | Status: DC
  Filled 2020-12-27: qty 0.23

## 2020-12-27 MED ORDER — VITAMIN B-12 1000 MCG PO TABS: 1000.0000 ug | ORAL_TABLET | Freq: Every day | ORAL | Status: DC

## 2020-12-27 MED ORDER — HYDRALAZINE HCL 25 MG PO TABS: 37.5000 mg | ORAL_TABLET | Freq: Three times a day (TID) | ORAL | 0 refills | Status: DC

## 2020-12-27 NOTE — Progress Notes (Signed)
Patient experiencing shortness of breath upon awakening. O2 level at 87, started 3.5 L O2 via nasal cannula. O2 increased to 94%.

## 2020-12-27 NOTE — TOC Transition Note (Signed)
Transition of Care [facility]) - CM/SW Discharge Note   Patient Details  Name: [name] MRN: [id number] Date of Birth: June 28, 1928  [facility] CM/SW Contact:  Tom-Johnson, Renea Ee, RN Phone Number: 12/27/2020, 4:21 PM   Clinical Narrative:   Patient is from home with wife. Presented to the ED with generalized malaise and COVID-19 PCR testing found to be positive. Has a walker, cane, shower seat, manual wheelchair at home. PT/OT recommended home health PT/OT. CM spoke with Erline Levine at [place], [phone]) and home health arranged. Patient is scheduled for discharged. Daughter to pickup at discharge. No further TOC needs noted.   Final next level of care: [place] Barriers to Discharge: No Barriers Identified   Patient Goals and CMS Choice Patient states their goals for this hospitalization and ongoing recovery are:: To go home CMS Medicare.gov Compare Post Acute Care list provided to:: Patient    Discharge Placement                       Discharge Plan and Services                          HH Arranged: PT, OT [facility] Agency: [place] Date [facility] Agency Contacted: 12/27/20 Time [place]: 0511    Social Determinants of Health (SDOH) Interventions     Readmission Risk Interventions No flowsheet data found.

## 2020-12-27 NOTE — Discharge Instructions (Signed)

## 2020-12-27 NOTE — Progress Notes (Signed)
Left lower extremity venous duplex completed. Refer to "CV Proc" under chart review to view preliminary results.  12/27/2020 9:27 AM Kelby Aline., MHA, RVT, RDCS, RDMS

## 2020-12-27 NOTE — Progress Notes (Signed)
Physical Therapy Treatment Patient Details Name: [name] MRN: [id number] DOB: Sep 11, 1928 Today's Date: 12/27/2020   History of Present Illness Pt is a 87 y.o. M who presents with generalized malaise and COVID-19 PCR testing found to be positive. Chest x-ray revealed patching bilateral lower lobe infiltrates. Significant PMH: CAD s/p CABG, CKD stage IIIb, DM2, ischemic cardiomyopathy with systolic and diastolic congestive heart failure, moderate aortic stenosis, paroxysmal atrial fibrillation, HTN, gout.    PT Comments    Pt progressing well towards his physical therapy goals, demonstrating improved activity tolerance. He was able to complete a warm up exercise for BLE's and then ambulate 110 ft with a walker at a supervision level. SpO2 97% on RA. Pulling 1000 mL on IS. D/c plan remains appropriate.    Recommendations for follow up therapy are one component of a multi-disciplinary discharge planning process, led by the attending physician.  Recommendations may be updated based on patient status, additional functional criteria and insurance authorization.  Follow Up Recommendations  Home health PT     Equipment Recommendations  None recommended by PT (pt well equipped)    Recommendations for Other Services       Precautions / Restrictions Precautions Precautions: Fall Restrictions Weight Bearing Restrictions: No     Mobility  Bed Mobility Overal bed mobility: Modified Independent             General bed mobility comments: OOB in chair    Transfers Overall transfer level: Needs assistance Equipment used: Rolling walker (2 wheeled) Transfers: Sit to/from Stand Sit to Stand: Supervision            Ambulation/Gait Ambulation/Gait assistance: Supervision Gait Distance (Feet): 110 Feet Assistive device: Rolling walker (2 wheeled) Gait Pattern/deviations: Step-through pattern;Decreased stride length;Trunk flexed Gait velocity: decreased   General Gait  Details: slow and steady pace, supervision for safety, cues for upright posture   Stairs             Wheelchair Mobility    Modified Rankin (Stroke Patients Only)       Balance Overall balance assessment: Mild deficits observed, not formally tested                                          Cognition Arousal/Alertness: Awake/alert Behavior During Therapy: WFL for tasks assessed/performed Overall Cognitive Status: Within Functional Limits for tasks assessed                                        Exercises General Exercises - Lower Extremity Long Arc Quad: Both;Seated;15 reps Hip Flexion/Marching: Both;10 reps;Seated Heel Raises: Both;15 reps;Seated    General Comments        Pertinent Vitals/Pain Pain Assessment: No/denies pain    Home Living Family/patient expects to be discharged to:: Private residence Living Arrangements: Spouse/significant other Available Help at Discharge: Family Type of Home: House Home Access: Ramped entrance   Home Layout: One level Home Equipment: Environmental consultant - 2 wheels;Walker - 4 wheels;Cane - single point;Shower seat;Wheelchair - manual      Prior Function Level of Independence: Independent with assistive device(s)      Comments: using RW inside of the house, Rollator for limtied community ambulation   PT Goals (current goals can now be found in the care plan section) Acute Rehab PT Goals  Patient Stated Goal: feel better PT Goal Formulation: With patient Time For Goal Achievement: 01/09/21 Potential to Achieve Goals: Good Progress towards PT goals: Progressing toward goals    Frequency    Min 3X/week      PT Plan Current plan remains appropriate    Co-evaluation              AM-PAC PT "6 Clicks" Mobility   Outcome Measure  Help needed turning from your back to your side while in a flat bed without using bedrails?: None Help needed moving from lying on your back to sitting on  the side of a flat bed without using bedrails?: None Help needed moving to and from a bed to a chair (including a wheelchair)?: A Little Help needed standing up from a chair using your arms (e.g., wheelchair or bedside chair)?: A Little Help needed to walk in hospital room?: A Little Help needed climbing 3-5 steps with a railing? : A Little 6 Click Score: 20    End of Session   Activity Tolerance: Patient tolerated treatment well Patient left: in chair;with call bell/phone within reach Nurse Communication: Mobility status PT Visit Diagnosis: Unsteadiness on feet (R26.81);Muscle weakness (generalized) (M62.81);Difficulty in walking, not elsewhere classified (R26.2)     Time: 5053-9767 PT Time Calculation (min) (ACUTE ONLY): 17 min  Charges:  $Therapeutic Activity: 8-22 mins                     Wyona Almas, PT, DPT Acute Rehabilitation Services Pager [phone] Office [phone]    [name] 12/27/2020, 1:00 PM

## 2020-12-27 NOTE — Progress Notes (Signed)
Heart Failure Navigator Progress Note  Assessed for Heart & Vascular TOC clinic readiness.  Patient does not meet criteria due to previously established AHF clinic patient.   Navigator available for reassessment of patient.   Pricilla Holm, MSN, RN Heart Failure Nurse Navigator [phone]

## 2020-12-27 NOTE — Discharge Summary (Signed)
Physician Discharge Summary  [name]:[id number] DOB: May 08, 1928 DOA: 12/25/2020  PCP: Burnis Medin, MD  Admit date: 12/25/2020 Discharge date: 12/27/2020  Time spent: 40 minutes  Recommendations for Outpatient Follow-up:  Follow outpatient CBC/CMP Continue torsemide daily x7 days, then qod as previously prescribed Isolate per CDC guidelines, if improving, can come off isolation after 10/19 Follow with cardiology outpatient Follow thyroid nodule outpatient Follow chest imagign outpatient  Continue eliquis - if falls become issue, would reevaluate long term - I think this was probably [name] result of covid  Discharge Diagnoses:  Principal Problem:   Acute on chronic combined systolic and diastolic CHF (congestive heart failure) ([place]) Active Problems:   Essential hypertension   Chronic kidney disease, stage 3b ([name])   AF (paroxysmal atrial fibrillation) ([place])   Uncontrolled type 2 diabetes mellitus with hyperglycemia, with long-term current use of insulin ([place])   COVID-19 virus infection   Mixed diabetic hyperlipidemia associated with type 2 diabetes mellitus ([place])   Elevated troponin level not due myocardial infarction   Discharge Condition: stable  Diet recommendation: heart healthy  Filed Weights   12/26/20 0122 12/27/20 0700 12/27/20 0950  Weight: 76.7 kg 76.9 kg 76.9 kg    History of present illness:  87 yo with hx CAD s/p CABG, CKD stage IIIb, T2DM, ischemic cardiomyopathy (EF 16-07%, grade 1 diastolic dysfunction), moderate AS, atrial fibrillation (s/p DCCV 05/2014), HTN, RLS, OSA and multiple other medical issues presenting to the hospital with [name] positive covid test.   He was treated with mab for covid.  Never consistently hypoxic, he received 2 doses of remdesivir, was not Breanne [name] candidate for steroids due to O2 sats consistently >94 on RA.  He was treated for overload with IV diuretics, discharged on torsemide daily.    Plan for outpatient cardiology follow up  after isolation   Hospital Course:    * Acute on chronic combined systolic and diastolic CHF (congestive heart failure) ([place]) Presented with bilateral LE edema, crackles He reported dry weight is 164, but on review of recent weights, his last cardiology visit weight as 170 lbs. -> he's 169 lbs today.  BNP up, none in past several years, difficult to trend, unclear significance with CKD.  Clinically he appears to be improved, ambulating with therapy with walker on RA without issue.   Transition to torsemide for 7 days, then resume qod CXR with patchy consolidation in lung bases CXR with cardiomegaly and pulm vascular congestion, trace effusions Echo 07/2020 with EF 20-25%, regional WMA, grade 1 diastolic dysfunction, low gradient moderate AV stenosis - see report Strict input and output monitoring Daily weights LE [place] without DVT    COVID-19 virus infection May be contributing to symptoms CXR with patchy consolidation in lung bases, could be due to covid CXR with cardiomegaly and pulm vascular congestion, follow repeat imaging outpatient  Home testing positive 10/8 Received monoclonal antibody 10/9 (bebtelovimab) Received remdesivir for [name] few doses - borderline.  O2 has been fluctuating here - was 94-97% with therapy on 10/10 and 10/11 per discussion with OT.  Had hypoxia this AM, unclear significance as he's maintained sats in [name] normal range consistently this morning.  No clear indications to escalate therapy, he's ok for home today.     Airborne and contact isolation initiated. Providing patient with supplemental oxygen for bouts of hypoxia with target oxygen saturation of 94%. Follow inflammatory markers - elevated d dimer, corrects for age  COVID-71 Labs  Recent Labs    12/26/20  1610 12/27/20 0402  DDIMER  --  0.68*  CRP 2.1* 2.4*    Lab Results  Component Value Date   SARSCOV2NAA POSITIVE ([name]) 12/25/2020   [place] NEGATIVE 10/24/2018    Mechanical Fall  Head Trauma He  denied head trauma, but has obvious bruising to head CT head/neck without acute fracture, 2.5 cm thyroid nodule, will need outpatient follow upt - small right parietal scalp hematoma   Elevated troponin level not due myocardial infarction Troponin mildly elevated and flat Patient is chest pain-free Monitoring patient on telemetry   Uncontrolled type 2 diabetes mellitus with hyperglycemia, with long-term current use of insulin ([name]) Substantial hyperglycemia on arrival Patient been placed on Accu-Cheks before every meal and nightly with sliding scale insulin Continue home regimen of basal insulin therapy Hemoglobin A1C, 8.5 Diabetic Diet   AF (paroxysmal atrial fibrillation) ([name]) Currently rate controlled. Continue home regimen of Eliquis Continue home regimen of scheduled Coreg Monitoring on telemetry    Chronic kidney disease, stage 3b ([name]) Strict intake and output monitoring Creatinine near baseline Minimizing nephrotoxic agents as much as possible Serial chemistries to monitor renal function and electrolytes   Essential hypertension Resume patients home regimen or oral antihypertensives Titrate antihypertensive regimen as necessary to achieve adequate BP control PRN intravenous antihypertensives for excessively elevated blood pressure    Mixed diabetic hyperlipidemia associated with type 2 diabetes mellitus ([name]) Continuing home regimen of lipid lowering therapy.  Procedures: LE [place] Summary:  RIGHT:  - No evidence of common femoral vein obstruction.     LEFT:  - There is no evidence of deep vein thrombosis in the lower extremity.     - No cystic structure found in the popliteal fossa.      *See table(s) above for measurements and observations.   Consultations: none  Discharge Exam: Vitals:   12/27/20 0700 12/27/20 0950  BP:  101/65  Pulse:  87  Resp:  18  Temp:    SpO2: 100% 94%   Feels ok Felt pretty good up and walking, denies SOB or chest  pain  General: No acute distress.  Seen up with therapy with [name] walker Cardiovascular: Heart sounds show [name] regular rate, and rhythm. Lungs: no clear adventitious lung sounds with isolation scope Abdomen: Soft, nontender, nondistended  Neurological: Alert and oriented 3. Moves all extremities 4 . Cranial nerves II through XII grossly intact. Hard of hearing Skin: bruising, scattered - to scalp, extremities Extremities: chronic L>R LEE  Discharge Instructions   Discharge Instructions     Call MD for:  difficulty breathing, headache or visual disturbances   Complete by: As directed    Call MD for:  extreme fatigue   Complete by: As directed    Call MD for:  hives   Complete by: As directed    Call MD for:  persistant dizziness or light-headedness   Complete by: As directed    Call MD for:  persistant nausea and vomiting   Complete by: As directed    Call MD for:  redness, tenderness, or signs of infection (pain, swelling, redness, odor or green/yellow discharge around incision site)   Complete by: As directed    Call MD for:  severe uncontrolled pain   Complete by: As directed    Call MD for:  temperature >100.4   Complete by: As directed    Diet - low sodium heart healthy   Complete by: As directed    Discharge instructions   Complete by: As directed  You were seen for heart failure and a COVID infection.  You received monoclonal antibody for your covid.  You also received a couple of doses of remdesivir.  Your oxygen levels are in a safe range.  We'll discharge you home with daily torsemide to take for 7 days.  After this, resume your prior regimen (every other day).  Please follow up with cardiology as an outpatient.  You had a fall.  Your imaging was reassuring.  You had a 2.5 cm left lobe thyroid nodule.  You should follow this with your outpatient doctor.   You should have repeat pulmonary imaging outpatient.  If you continue to have falls, you may need to discuss  whether you need to stop your eliquis.  Return for new, recurrent, or worsening symptoms.  Please ask your PCP to request records from this hospitalization so they know what was done and what the next steps will be.   Increase activity slowly   Complete by: As directed    No wound care   Complete by: As directed       Allergies as of 12/27/2020   No Known Allergies      Medication List     TAKE these medications    Accu-Chek Aviva Plus test strip Generic drug: glucose blood Check blood sugars up to three times daily as needed. Please schedule your follow up with labs with Dr. Regis Bill. [phone]   Accu-Chek Aviva Plus w/Device Kit Check blood sugars daily up to three times daily or as needed.   acetaminophen 650 MG CR tablet Commonly known as: TYLENOL Take 650 mg by mouth every 8 (eight) hours as needed for pain.   allopurinol 100 MG tablet Commonly known as: ZYLOPRIM TAKE 1 TABLET DAILY   amiodarone 200 MG tablet Commonly known as: PACERONE TAKE 1/2 TABLET (100MG TOTAL) DAILY What changed: See the new instructions.   apixaban 2.5 MG Tabs tablet Commonly known as: ELIQUIS Take 1 tablet (2.5 mg total) by mouth 2 (two) times daily.   atorvastatin 40 MG tablet Commonly known as: LIPITOR TAKE 1 TABLET DAILY   carvedilol 12.5 MG tablet Commonly known as: COREG TAKE 1 TABLET TWICE A DAY (CANCEL ALL PREVIOUS ORDERS FOR CURRENT MEDICATION. CHANGE IN DOSAGE OR TABLET SIZE) What changed: See the new instructions.   digoxin 0.125 MG tablet Commonly known as: LANOXIN Take 0.5 tablets (0.0625 mg total) by mouth 3 (three) times a week.   escitalopram 10 MG tablet Commonly known as: LEXAPRO Take 1 tablet (10 mg total) by mouth daily. Please schedule follow up with Dr. Regis Bill for refills. [phone] Thank you!   Farxiga 10 MG Tabs tablet Generic drug: dapagliflozin propanediol TAKE 1 TABLET BY MOUTH DAILY BEFORE BREAKFAST What changed: how much to take    gabapentin 100 MG capsule Commonly known as: NEURONTIN Take 1 capsule (100 mg total) by mouth 3 (three) times daily. What changed: when to take this   hydrALAZINE 25 MG tablet Commonly known as: APRESOLINE Take 1.5 tablets (37.5 mg total) by mouth every 8 (eight) hours.   insulin glargine 100 unit/mL Sopn Commonly known as: LANTUS Inject 30 Units into the skin at bedtime.   Insulin Pen Needle 32G X 4 MM Misc Commonly known as: BD Pen Needle Nano U/F [place] TO TEST BLOOD SUGAR THREE TIMES DAILY   isosorbide mononitrate 60 MG 24 hr tablet Commonly known as: IMDUR TAKE 1 TABLET DAILY What changed: when to take this   lidocaine 5 % Commonly known  as: Lidoderm Place 1 patch onto the skin daily. Remove & Discard patch within 12 hours or as directed by MD What changed:  when to take this reasons to take this   OneTouch Delica Lancets Fine Misc Use to test blood sugar 2-3 times daily   OVER THE COUNTER MEDICATION Place 1 spray into both nostrils daily as needed (runny nose). OTC nasal spray - unknown name   pramipexole 0.5 MG tablet Commonly known as: MIRAPEX TAKE 1 TABLET 2 TO 3 HOURS BEFORE SLEEP FOR RESTLESS LEG What changed: See the new instructions.   silodosin 8 MG Caps capsule Commonly known as: RAPAFLO TAKE 1 CAPSULE DAILY WITH BREAKFAST What changed: See the new instructions.   spironolactone 25 MG tablet Commonly known as: ALDACTONE TAKE 1 TABLET DAILY What changed: when to take this   torsemide 20 MG tablet Commonly known as: DEMADEX Take 1 tablet (20 mg total) by mouth daily for 7 days, THEN 1 tablet (20 mg total) every other day for 21 days. Please follow up with your cardiologist for additional instructions after this hospitalization. Start taking on: December 27, 2020 What changed: See the new instructions.   vitamin B-12 1000 MCG tablet Commonly known as: CYANOCOBALAMIN Take 1 tablet (1,000 mcg total) by mouth daily. What changed: when to take this        No Known Allergies    The results of significant diagnostics from this hospitalization (including imaging, microbiology, ancillary and laboratory) are listed below for reference.    Significant Diagnostic Studies: CT HEAD WO CONTRAST (5MM)  Result Date: 12/26/2020 CLINICAL DATA:  Head and neck trauma EXAM: CT HEAD WITHOUT CONTRAST TECHNIQUE: Contiguous axial images were obtained from the base of the skull through the vertex without intravenous contrast. COMPARISON:  12/16/2009 FINDINGS: Brain: Progressive confluent hypodensities throughout the periventricular white matter consistent with chronic small vessel ischemic changes. No signs of acute infarct or hemorrhage. Lateral ventricles and midline structures are unremarkable. No acute extra-axial fluid collections. No mass effect. Vascular: Prominent atherosclerosis of the internal carotid arteries. No hyperdense vessel. Skull: Small right parietal scalp hematoma. No underlying fracture. Negative for fracture or focal lesion. Sinuses/Orbits: Mild mucosal thickening within the ethmoid and sphenoid sinuses. Other: None. IMPRESSION: 1. No acute intracranial process. 2. Small right parietal scalp hematoma. Electronically Signed   By: Randa Ngo M.D.   On: 12/26/2020 21:26   CT CERVICAL SPINE WO CONTRAST  Result Date: 12/26/2020 CLINICAL DATA:  Head and neck trauma EXAM: CT CERVICAL SPINE WITHOUT CONTRAST TECHNIQUE: Multidetector CT imaging of the cervical spine was performed without intravenous contrast. Multiplanar CT image reconstructions were also generated. COMPARISON:  None. FINDINGS: Alignment: Alignment is grossly anatomic. Skull base and vertebrae: No acute fracture. No primary bone lesion or focal pathologic process. Soft tissues and spinal canal: No prevertebral fluid or swelling. No visible canal hematoma. 2.3 x 2.0 x 2.5 cm hypodense nodule arises from the lower pole of the left lobe thyroid. Disc levels: Mild diffuse facet  hypertrophy. Disc spaces are well preserved. Upper chest: Central airway is patent.  Lung apices are clear. Other: Reconstructed images demonstrate no additional findings. IMPRESSION: 1. No acute cervical spine fracture. 2. Incidental 2.5 cm left lobe thyroid nodule. Recommend nonemergent thyroid US if clinically warranted given patient age. Electronically Signed   By: Randa Ngo M.D.   On: 12/26/2020 21:31   DG CHEST PORT 1 VIEW  Result Date: 12/27/2020 CLINICAL DATA:  Shortness of breath, COVID EXAM: PORTABLE CHEST 1 VIEW  COMPARISON:  12/25/2020 FINDINGS: Cardiomegaly with left chest multi lead pacer defibrillator. Prominent pulmonary vasculature without acute airspace opacity. Possible trace pleural effusions and or pleural thickening. IMPRESSION: Cardiomegaly and pulmonary vascular congestion. No acute airspace opacity. Possible trace pleural effusions and or pleural thickening. Electronically Signed   By: Delanna Ahmadi M.D.   On: 12/27/2020 10:51   DG Chest Portable 1 View  Result Date: 12/25/2020 CLINICAL DATA:  COVID positive. Shortness of breath. EXAM: PORTABLE CHEST 1 VIEW COMPARISON:  January 2020 FINDINGS: Cardiac pacemaker and postsurgical changes are stable. Enlarged cardiac silhouette. Calcific atherosclerotic disease and tortuosity of the aorta. Subtle patchy areas of airspace consolidation in the lung bases. Osseous structures are without acute abnormality. Soft tissues are grossly normal. IMPRESSION: Subtle patchy areas of airspace consolidation in the lung bases may represent early atypical pneumonia. Electronically Signed   By: Fidela Salisbury M.D.   On: 12/25/2020 18:09   XR C-ARM NO REPORT  Result Date: 12/01/2020 Please see Notes tab for imaging impression.  VAS [place] LOWER EXTREMITY VENOUS (DVT)  Result Date: 12/27/2020  Lower Venous DVT Study Patient Name:  [name]  Date of Exam:   12/27/2020 Medical Rec #: [id number]        Accession #:    [id number] Date of  Birth: Apr 26, 1928        Patient Gender: M Patient Age:   46 years Exam Location:  [facility] Procedure:      VAS [place] LOWER EXTREMITY VENOUS (DVT) Referring Phys: A POWELL JR --------------------------------------------------------------------------------  Indications: Left > right LE edema.  Risk Factors: COVID-19 +. Comparison Study: No prior study Performing Technologist: Maudry Mayhew MHA, RDMS, RVT, RDCS  Examination Guidelines: A complete evaluation includes B-mode imaging, spectral Doppler, color Doppler, and power Doppler as needed of all accessible portions of each vessel. Bilateral testing is considered an integral part of a complete examination. Limited examinations for reoccurring indications may be performed as noted. The reflux portion of the exam is performed with the patient in reverse Trendelenburg.  +-----+---------------+---------+-----------+----------+--------------+ RIGHTCompressibilityPhasicitySpontaneityPropertiesThrombus Aging +-----+---------------+---------+-----------+----------+--------------+ CFV  Full           Yes      Yes                                 +-----+---------------+---------+-----------+----------+--------------+   +---------+---------------+---------+-----------+----------+--------------+ LEFT     CompressibilityPhasicitySpontaneityPropertiesThrombus Aging +---------+---------------+---------+-----------+----------+--------------+ CFV      Full           Yes      Yes                                 +---------+---------------+---------+-----------+----------+--------------+ SFJ      Full                                                        +---------+---------------+---------+-----------+----------+--------------+ FV Prox  Full                                                        +---------+---------------+---------+-----------+----------+--------------+ FV  Mid   Full                                                         +---------+---------------+---------+-----------+----------+--------------+ FV DistalFull                                                        +---------+---------------+---------+-----------+----------+--------------+ PFV      Full                                                        +---------+---------------+---------+-----------+----------+--------------+ POP      Full           Yes      Yes                                 +---------+---------------+---------+-----------+----------+--------------+ PTV      Full                                                        +---------+---------------+---------+-----------+----------+--------------+ PERO     Full                                                        +---------+---------------+---------+-----------+----------+--------------+ Gastroc  Full                    Yes                                 +---------+---------------+---------+-----------+----------+--------------+    Summary: RIGHT: - No evidence of common femoral vein obstruction.  LEFT: - There is no evidence of deep vein thrombosis in the lower extremity.  - No cystic structure found in the popliteal fossa.  *See table(s) above for measurements and observations.    Preliminary     Microbiology: Recent Results (from the past 240 hour(s))  Resp Panel by RT-PCR (Flu A&B, Covid)     Status: Abnormal   Collection Time: 12/25/20  8:26 PM  Result Value Ref Range Status   SARS Coronavirus 2 by RT PCR POSITIVE (A) NEGATIVE Final    Comment: RESULT CALLED TO, READ BACK BY AND VERIFIED WITH:  MALLARD,A RN _0  12/25/20 EDENSC[facility] (NOTE) SARS-CoV-2 target nucleic acids are DETECTED.  The SARS-CoV-2 RNA is generally detectable in upper respiratory specimens during the acute phase of infection. Positive results are indicative of the presence of the identified virus, but do not rule out bacterial infection or co-infection with other pathogens  not detected by the test. Clinical correlation with patient history and  other diagnostic information is necessary to determine patient infection status. The expected result is Negative.  Fact Sheet for Patients: EntrepreneurPulse.com.au  Fact Sheet for Healthcare Providers: IncredibleEmployment.be  This test is not yet approved or cleared by the [place] FDA and  has been authorized for detection and/or diagnosis of SARS-CoV-2 by FDA under an Emergency Use Authorization (EUA).  This EUA will remain in effect (meaning this test can  be used) for the duration of  the COVID-19 declaration under Section 564(b)(1) of the Act, 21 U.S.C. section 360bbb-3(b)(1), unless the authorization is terminated or revoked sooner.     Influenza A by PCR NEGATIVE NEGATIVE Final   Influenza B by PCR NEGATIVE NEGATIVE Final    Comment: (NOTE) The Xpert Xpress SARS-CoV-2/FLU/RSV plus assay is intended as an aid in the diagnosis of influenza from Nasopharyngeal swab specimens and should not be used as a sole basis for treatment. Nasal washings and aspirates are unacceptable for Xpert Xpress SARS-CoV-2/FLU/RSV testing.  Fact Sheet for Patients: EntrepreneurPulse.com.au  Fact Sheet for Healthcare Providers: IncredibleEmployment.be  This test is not yet approved or cleared by the [place] FDA and has been authorized for detection and/or diagnosis of SARS-CoV-2 by FDA under an Emergency Use Authorization (EUA). This EUA will remain in effect (meaning this test can be used) for the duration of the COVID-19 declaration under Section 564(b)(1) of the Act, 21 U.S.C. section 360bbb-3(b)(1), unless the authorization is terminated or revoked.  Performed at [facility], [address] [place], [place] [address]      Labs: Basic Metabolic Panel: Recent Labs  Lab 12/25/20 1722 12/26/20 0351 12/27/20 0402  NA  138 140 139  K 5.1 4.6 4.4  CL 108 106 104  CO2 _0 GLUCOSE 370* 306* 92  BUN 53* 50* 49*  CREATININE 2.11* 2.05* 2.05*  CALCIUM 8.2* 8.6* 8.1*  MG  --  2.2 2.0   Liver Function Tests: Recent Labs  Lab 12/25/20 1722 12/26/20 0351 12/27/20 0402  AST 45* 33 24  ALT 52* 50* 40  ALKPHOS 86 80 79  BILITOT 0.4 0.7 0.5  PROT 5.7* 5.6* 5.2*  ALBUMIN 3.1* 2.8* 2.7*   No results for input(s): LIPASE, AMYLASE in the last 168 hours. No results for input(s): AMMONIA in the last 168 hours. CBC: Recent Labs  Lab 12/25/20 1722 12/27/20 0402  WBC 7.4 6.5  NEUTROABS 5.5 3.9  HGB 9.4* 9.7*  HCT 31.0* 32.6*  MCV 86.6 86.0  PLT 199 192   Cardiac Enzymes: No results for input(s): CKTOTAL, CKMB, CKMBINDEX, TROPONINI in the last 168 hours. BNP: BNP (last 3 results) Recent Labs    12/25/20 1722 12/27/20 0402  BNP 2,285.9* 2,773.2*    ProBNP (last 3 results) No results for input(s): PROBNP in the last 8760 hours.  CBG: Recent Labs  Lab 12/26/20 2132 12/27/20 0010 12/27/20 0650 12/27/20 0739 12/27/20 1143  GLUCAP 138* 162* 66* 139* 166*       Signed:  Fayrene Helper MD.  [facility] 12/27/2020, 1:32 PM

## 2020-12-27 NOTE — Progress Notes (Signed)
Patient ambulated in room, approximately 55ft on room air. O2 sats maintained at 95-96%, no c/o SOB. Encouraged patient to sit up in chair for a period of time. Dr. Florene Glen made aware, wil continue to monitor.

## 2020-12-27 NOTE — Evaluation (Signed)
Occupational Therapy Evaluation Patient Details Name: [name] MRN: [id number] DOB: 15-Jul-1928 Today's Date: 12/27/2020   History of Present Illness Pt is a 87 y.o. M who presents with generalized malaise and COVID-19 PCR testing found to be positive. Chest x-ray revealed patching bilateral lower lobe infiltrates. Significant PMH: CAD s/p CABG, CKD stage IIIb, DM2, ischemic cardiomyopathy with systolic and diastolic congestive heart failure, moderate aortic stenosis, paroxysmal atrial fibrillation, HTN, gout.   Clinical Impression   Pt reports at PLOF uses FW in the home and lives with wife but has a daughter that assist with dropping of groceries to the home. Pt remained on room air with all activity remained in 90% and no reports of SOB in session. Pt cues on pacing and positioning to increase in breathing techniques with ADLS. Pt requires min guard to min assist for LE ADLS due to skin integrity . Pt currently with functional limitations due to the deficits listed below (see OT Problem List).  Pt will benefit from skilled OT to increase their safety and independence with ADL and functional mobility for ADL to facilitate discharge to venue listed below.        Recommendations for follow up therapy are one component of a multi-disciplinary discharge planning process, led by the attending physician.  Recommendations may be updated based on patient status, additional functional criteria and insurance authorization.   Follow Up Recommendations  Home health OT;Supervision - Intermittent    Equipment Recommendations       Recommendations for Other Services       Precautions / Restrictions Precautions Precautions: Fall Restrictions Weight Bearing Restrictions: No      Mobility Bed Mobility Overal bed mobility: Modified Independent             General bed mobility comments: presented in chair    Transfers Overall transfer level: Needs assistance Equipment used: Rolling  walker (2 wheeled) Transfers: Sit to/from Stand Sit to Stand: Min guard (as completed further transfers required supervision)              Balance Overall balance assessment: Mild deficits observed, not formally tested                                         ADL either performed or assessed with clinical judgement   ADL Overall ADL's : Needs assistance/impaired Eating/Feeding: Independent;Sitting   Grooming: Wash/dry hands;Wash/dry face;Supervision/safety;Cueing for safety;Cueing for sequencing;Sitting;Standing   Upper Body Bathing: Supervision/ safety;Cueing for safety;Cueing for sequencing;Sitting   Lower Body Bathing: Minimal assistance;Cueing for safety;Cueing for sequencing;Sitting/lateral leans;Min guard   Upper Body Dressing : Set up;Cueing for safety;Cueing for sequencing;Sitting   Lower Body Dressing: Min guard;Minimal assistance;Cueing for safety;Cueing for sequencing;Sit to/from stand   Toilet Transfer: Cueing for safety;Cueing for sequencing;RW;Supervision/safety   Toileting- Water quality scientist and Hygiene: Min guard;Cueing for safety;Cueing for sequencing;Sit to/from stand       Functional mobility during ADLs: Supervision/safety;Cueing for safety;Cueing for sequencing;Rolling walker       Vision Baseline Vision/History: 1 Wears glasses Ability to See in Adequate Light: 0 Adequate Patient Visual Report: No change from baseline       Perception     Praxis      Pertinent Vitals/Pain Pain Assessment: No/denies pain     Hand Dominance Right   Extremity/Trunk Assessment Upper Extremity Assessment Upper Extremity Assessment: RUE deficits/detail RUE Deficits / Details: Pt has about 25  degres of AROM at shoulder flexion/abduction   Lower Extremity Assessment Lower Extremity Assessment: Defer to PT evaluation   Cervical / Trunk Assessment Cervical / Trunk Assessment: Kyphotic   Communication Communication Communication: No  difficulties   Cognition Arousal/Alertness: Awake/alert Behavior During Therapy: WFL for tasks assessed/performed Overall Cognitive Status: Within Functional Limits for tasks assessed                                     General Comments       Exercises     Shoulder Instructions      Home Living Family/patient expects to be discharged to:: Private residence Living Arrangements: Spouse/significant other Available Help at Discharge: Family Type of Home: House Home Access: Ramped entrance     Home Layout: One level     Bathroom Shower/Tub: Tub/shower unit (soaker tub with small step entrance)         Home Equipment: Walker - 2 wheels;Walker - 4 wheels;Cane - single point;Shower seat;Wheelchair - manual          Prior Functioning/Environment Level of Independence: Independent with assistive device(s)        Comments: using RW inside of the house, Rollator for limtied community ambulation        OT Problem List: Decreased strength;Decreased activity tolerance;Impaired balance (sitting and/or standing);Decreased safety awareness;Decreased knowledge of use of DME or AE      OT Treatment/Interventions: Self-care/ADL training;Therapeutic exercise;DME and/or AE instruction;Therapeutic activities;Patient/family education;Balance training    OT Goals(Current goals can be found in the care plan section) Acute Rehab OT Goals Patient Stated Goal: to have some coffee OT Goal Formulation: With patient Time For Goal Achievement: 01/07/21 Potential to Achieve Goals: Good  OT Frequency: Min 2X/week   Barriers to D/C:            Co-evaluation              AM-PAC OT "6 Clicks" Daily Activity     Outcome Measure Help from another person eating meals?: None Help from another person taking care of personal grooming?: A Little Help from another person toileting, which includes using toliet, bedpan, or urinal?: A Little Help from another person bathing  (including washing, rinsing, drying)?: A Little Help from another person to put on and taking off regular upper body clothing?: None Help from another person to put on and taking off regular lower body clothing?: A Little 6 Click Score: 20   End of Session Equipment Utilized During Treatment: Gait belt Nurse Communication: Mobility status  Activity Tolerance: Patient tolerated treatment well Patient left: in chair;with call bell/phone within reach  OT Visit Diagnosis: Unsteadiness on feet (R26.81);Other abnormalities of gait and mobility (R26.89)                Time: 1324-4010 OT Time Calculation (min): 27 min Charges:  OT General Charges $OT Visit: 1 Visit OT Evaluation $OT Eval Low Complexity: 1 Low OT Treatments $Self Care/Home Management : 8-22 mins  Joeseph Amor OTR/L  Acute Rehab Services  [phone] office number [phone] pager number   Joeseph Amor 12/27/2020, 11:47 AM

## 2020-12-29 NOTE — Telephone Encounter (Signed)
Was out of office when message came in .  Will review record hospitalization  DC syummar

## 2020-12-30 ENCOUNTER — Telehealth: Payer: Self-pay

## 2020-12-30 NOTE — Telephone Encounter (Signed)
Saw that he did ok in hospital . But diabetes was in less control( after we stopped some meds ozempic?)  Advise fu for DM  in 3-4weeks to decide if need to  adjust medication .

## 2020-12-30 NOTE — Telephone Encounter (Signed)
I spoke with the pt's wife and she stated that he has been experiencing ingoing congestion since he was dismissed from the ED. Virtual visit has been scheduled for 10/17. Pt's wife informed of the message below and verbalized understanding.

## 2020-12-30 NOTE — Telephone Encounter (Signed)
Transition Care Management Unsuccessful Follow-up Telephone Call  Date of discharge and from where:  12/27/2020 [facility]  Attempts:  1st Attempt  Reason for unsuccessful TCM follow-up call:  Unable to reach patient

## 2021-01-01 ENCOUNTER — Emergency Department (HOSPITAL_COMMUNITY): Payer: No Typology Code available for payment source

## 2021-01-01 ENCOUNTER — Inpatient Hospital Stay (HOSPITAL_COMMUNITY)
Admission: EM | Admit: 2021-01-01 | Discharge: 2021-01-13 | DRG: 291 | Disposition: A | Discharge status: Skilled Nursing Facility | Payer: No Typology Code available for payment source | Source: Skilled Nursing Facility | Attending: Family Medicine | Admitting: Family Medicine

## 2021-01-01 DIAGNOSIS — L89899 Pressure ulcer of other site, unspecified stage: Secondary | ICD-10-CM | POA: Diagnosis present

## 2021-01-01 DIAGNOSIS — Z961 Presence of intraocular lens: Secondary | ICD-10-CM | POA: Diagnosis present

## 2021-01-01 DIAGNOSIS — D509 Iron deficiency anemia, unspecified: Secondary | ICD-10-CM | POA: Diagnosis not present

## 2021-01-01 DIAGNOSIS — I10 Essential (primary) hypertension: Secondary | ICD-10-CM | POA: Diagnosis present

## 2021-01-01 DIAGNOSIS — H9190 Unspecified hearing loss, unspecified ear: Secondary | ICD-10-CM | POA: Diagnosis present

## 2021-01-01 DIAGNOSIS — I255 Ischemic cardiomyopathy: Secondary | ICD-10-CM | POA: Diagnosis present

## 2021-01-01 DIAGNOSIS — Z825 Family history of asthma and other chronic lower respiratory diseases: Secondary | ICD-10-CM

## 2021-01-01 DIAGNOSIS — S41119A Laceration without foreign body of unspecified upper arm, initial encounter: Secondary | ICD-10-CM | POA: Diagnosis present

## 2021-01-01 DIAGNOSIS — Z85828 Personal history of other malignant neoplasm of skin: Secondary | ICD-10-CM

## 2021-01-01 DIAGNOSIS — N170 Acute kidney failure with tubular necrosis: Secondary | ICD-10-CM | POA: Diagnosis not present

## 2021-01-01 DIAGNOSIS — N171 Acute kidney failure with acute cortical necrosis: Secondary | ICD-10-CM | POA: Diagnosis not present

## 2021-01-01 DIAGNOSIS — N1832 Chronic kidney disease, stage 3b: Secondary | ICD-10-CM | POA: Diagnosis present

## 2021-01-01 DIAGNOSIS — Z951 Presence of aortocoronary bypass graft: Secondary | ICD-10-CM

## 2021-01-01 DIAGNOSIS — Z66 Do not resuscitate: Secondary | ICD-10-CM | POA: Diagnosis present

## 2021-01-01 DIAGNOSIS — N184 Chronic kidney disease, stage 4 (severe): Secondary | ICD-10-CM | POA: Diagnosis present

## 2021-01-01 DIAGNOSIS — E785 Hyperlipidemia, unspecified: Secondary | ICD-10-CM | POA: Diagnosis present

## 2021-01-01 DIAGNOSIS — E1165 Type 2 diabetes mellitus with hyperglycemia: Secondary | ICD-10-CM | POA: Diagnosis present

## 2021-01-01 DIAGNOSIS — I350 Nonrheumatic aortic (valve) stenosis: Secondary | ICD-10-CM | POA: Diagnosis present

## 2021-01-01 DIAGNOSIS — I480 Paroxysmal atrial fibrillation: Secondary | ICD-10-CM | POA: Diagnosis present

## 2021-01-01 DIAGNOSIS — Z7901 Long term (current) use of anticoagulants: Secondary | ICD-10-CM

## 2021-01-01 DIAGNOSIS — J208 Acute bronchitis due to other specified organisms: Secondary | ICD-10-CM | POA: Diagnosis present

## 2021-01-01 DIAGNOSIS — I2510 Atherosclerotic heart disease of native coronary artery without angina pectoris: Secondary | ICD-10-CM | POA: Diagnosis present

## 2021-01-01 DIAGNOSIS — I130 Hypertensive heart and chronic kidney disease with heart failure and stage 1 through stage 4 chronic kidney disease, or unspecified chronic kidney disease: Secondary | ICD-10-CM | POA: Diagnosis present

## 2021-01-01 DIAGNOSIS — I5023 Acute on chronic systolic (congestive) heart failure: Secondary | ICD-10-CM | POA: Diagnosis not present

## 2021-01-01 DIAGNOSIS — X58XXXA Exposure to other specified factors, initial encounter: Secondary | ICD-10-CM | POA: Diagnosis present

## 2021-01-01 DIAGNOSIS — I252 Old myocardial infarction: Secondary | ICD-10-CM

## 2021-01-01 DIAGNOSIS — G2581 Restless legs syndrome: Secondary | ICD-10-CM | POA: Diagnosis not present

## 2021-01-01 DIAGNOSIS — Z9049 Acquired absence of other specified parts of digestive tract: Secondary | ICD-10-CM

## 2021-01-01 DIAGNOSIS — R0600 Dyspnea, unspecified: Secondary | ICD-10-CM

## 2021-01-01 DIAGNOSIS — I6523 Occlusion and stenosis of bilateral carotid arteries: Secondary | ICD-10-CM | POA: Diagnosis present

## 2021-01-01 DIAGNOSIS — R778 Other specified abnormalities of plasma proteins: Secondary | ICD-10-CM | POA: Diagnosis present

## 2021-01-01 DIAGNOSIS — E119 Type 2 diabetes mellitus without complications: Secondary | ICD-10-CM | POA: Diagnosis not present

## 2021-01-01 DIAGNOSIS — I5043 Acute on chronic combined systolic (congestive) and diastolic (congestive) heart failure: Secondary | ICD-10-CM | POA: Diagnosis present

## 2021-01-01 DIAGNOSIS — M1990 Unspecified osteoarthritis, unspecified site: Secondary | ICD-10-CM | POA: Diagnosis present

## 2021-01-01 DIAGNOSIS — Z79899 Other long term (current) drug therapy: Secondary | ICD-10-CM

## 2021-01-01 DIAGNOSIS — Y92239 Unspecified place in hospital as the place of occurrence of the external cause: Secondary | ICD-10-CM | POA: Diagnosis present

## 2021-01-01 DIAGNOSIS — I248 Other forms of acute ischemic heart disease: Secondary | ICD-10-CM | POA: Diagnosis present

## 2021-01-01 DIAGNOSIS — U071 COVID-19: Secondary | ICD-10-CM | POA: Diagnosis present

## 2021-01-01 DIAGNOSIS — Z9841 Cataract extraction status, right eye: Secondary | ICD-10-CM

## 2021-01-01 DIAGNOSIS — Z95810 Presence of automatic (implantable) cardiac defibrillator: Secondary | ICD-10-CM

## 2021-01-01 DIAGNOSIS — J9601 Acute respiratory failure with hypoxia: Secondary | ICD-10-CM | POA: Diagnosis not present

## 2021-01-01 DIAGNOSIS — N179 Acute kidney failure, unspecified: Secondary | ICD-10-CM | POA: Diagnosis present

## 2021-01-01 DIAGNOSIS — M109 Gout, unspecified: Secondary | ICD-10-CM | POA: Diagnosis present

## 2021-01-01 DIAGNOSIS — R7989 Other specified abnormal findings of blood chemistry: Secondary | ICD-10-CM | POA: Diagnosis present

## 2021-01-01 DIAGNOSIS — F32A Depression, unspecified: Secondary | ICD-10-CM | POA: Diagnosis present

## 2021-01-01 DIAGNOSIS — R0602 Shortness of breath: Secondary | ICD-10-CM

## 2021-01-01 DIAGNOSIS — I4891 Unspecified atrial fibrillation: Secondary | ICD-10-CM | POA: Diagnosis not present

## 2021-01-01 DIAGNOSIS — D72829 Elevated white blood cell count, unspecified: Secondary | ICD-10-CM | POA: Diagnosis present

## 2021-01-01 DIAGNOSIS — E7849 Other hyperlipidemia: Secondary | ICD-10-CM | POA: Diagnosis not present

## 2021-01-01 DIAGNOSIS — T502X5A Adverse effect of carbonic-anhydrase inhibitors, benzothiadiazides and other diuretics, initial encounter: Secondary | ICD-10-CM | POA: Diagnosis present

## 2021-01-01 DIAGNOSIS — Z95828 Presence of other vascular implants and grafts: Secondary | ICD-10-CM

## 2021-01-01 DIAGNOSIS — Z794 Long term (current) use of insulin: Secondary | ICD-10-CM

## 2021-01-01 DIAGNOSIS — J9690 Respiratory failure, unspecified, unspecified whether with hypoxia or hypercapnia: Secondary | ICD-10-CM | POA: Diagnosis not present

## 2021-01-01 DIAGNOSIS — Z85820 Personal history of malignant melanoma of skin: Secondary | ICD-10-CM

## 2021-01-01 DIAGNOSIS — E1122 Type 2 diabetes mellitus with diabetic chronic kidney disease: Secondary | ICD-10-CM | POA: Diagnosis not present

## 2021-01-01 DIAGNOSIS — Z87891 Personal history of nicotine dependence: Secondary | ICD-10-CM

## 2021-01-01 DIAGNOSIS — N189 Chronic kidney disease, unspecified: Secondary | ICD-10-CM | POA: Diagnosis not present

## 2021-01-01 DIAGNOSIS — Z8616 Personal history of COVID-19: Secondary | ICD-10-CM | POA: Diagnosis not present

## 2021-01-01 DIAGNOSIS — G4733 Obstructive sleep apnea (adult) (pediatric): Secondary | ICD-10-CM | POA: Diagnosis not present

## 2021-01-01 DIAGNOSIS — Z9842 Cataract extraction status, left eye: Secondary | ICD-10-CM

## 2021-01-01 LAB — BASIC METABOLIC PANEL
Anion gap: 11 (ref 5–15)
BUN: 70 mg/dL — ABNORMAL HIGH (ref 8–23)
CO2: 25 mmol/L (ref 22–32)
Calcium: 8.6 mg/dL — ABNORMAL LOW (ref 8.9–10.3)
Chloride: 102 mmol/L (ref 98–111)
Creatinine, Ser: 2.64 mg/dL — ABNORMAL HIGH (ref 0.61–1.24)
GFR, Estimated: 22 mL/min — ABNORMAL LOW (ref >60)
Glucose, Bld: 253 mg/dL — ABNORMAL HIGH (ref 70–99)
Potassium: 4.2 mmol/L (ref 3.5–5.1)
Sodium: 138 mmol/L (ref 135–145)

## 2021-01-01 LAB — URINALYSIS, ROUTINE W REFLEX MICROSCOPIC
Bacteria, UA: NONE SEEN
Bilirubin Urine: NEGATIVE
Glucose, UA: >=500 mg/dL — AB
Hgb urine dipstick: NEGATIVE
Ketones, ur: NEGATIVE mg/dL
Leukocytes,Ua: NEGATIVE
Nitrite: NEGATIVE
Protein, ur: NEGATIVE mg/dL
Specific Gravity, Urine: 1.01 (ref 1.005–1.030)
pH: 5 (ref 5.0–8.0)

## 2021-01-01 LAB — CBC WITH DIFFERENTIAL/PLATELET
Abs Immature Granulocytes: 0.06 10*3/uL (ref 0.00–0.07)
Basophils Absolute: 0 10*3/uL (ref 0.0–0.1)
Basophils Relative: 0 %
Eosinophils Absolute: 0 10*3/uL (ref 0.0–0.5)
Eosinophils Relative: 0 %
HCT: 30.9 % — ABNORMAL LOW (ref 39.0–52.0)
Hemoglobin: 9.1 g/dL — ABNORMAL LOW (ref 13.0–17.0)
Immature Granulocytes: 0 %
Lymphocytes Relative: 8 %
Lymphs Abs: 1 10*3/uL (ref 0.7–4.0)
MCH: 25.3 pg — ABNORMAL LOW (ref 26.0–34.0)
MCHC: 29.4 g/dL — ABNORMAL LOW (ref 30.0–36.0)
MCV: 86.1 fL (ref 80.0–100.0)
Monocytes Absolute: 0.9 10*3/uL (ref 0.1–1.0)
Monocytes Relative: 6 %
Neutro Abs: 11.6 10*3/uL — ABNORMAL HIGH (ref 1.7–7.7)
Neutrophils Relative %: 86 %
Platelets: 220 10*3/uL (ref 150–400)
RBC: 3.59 MIL/uL — ABNORMAL LOW (ref 4.22–5.81)
RDW: 16.4 % — ABNORMAL HIGH (ref 11.5–15.5)
WBC: 13.6 10*3/uL — ABNORMAL HIGH (ref 4.0–10.5)
nRBC: 0 % (ref 0.0–0.2)

## 2021-01-01 LAB — RESP PANEL BY RT-PCR (FLU A&B, COVID) ARPGX2
Influenza A by PCR: NEGATIVE
Influenza B by PCR: NEGATIVE
SARS Coronavirus 2 by RT PCR: POSITIVE — AB

## 2021-01-01 LAB — CBG MONITORING, ED: Glucose-Capillary: 205 mg/dL — ABNORMAL HIGH (ref 70–99)

## 2021-01-01 LAB — MAGNESIUM: Magnesium: 2.2 mg/dL (ref 1.7–2.4)

## 2021-01-01 LAB — TROPONIN I (HIGH SENSITIVITY)
Troponin I (High Sensitivity): 36 ng/L — ABNORMAL HIGH (ref <18)
Troponin I (High Sensitivity): 35 ng/L — ABNORMAL HIGH (ref <18)

## 2021-01-01 LAB — DIGOXIN LEVEL: Digoxin Level: 0.4 ng/mL — ABNORMAL LOW (ref 0.8–2.0)

## 2021-01-01 LAB — LACTIC ACID, PLASMA
Lactic Acid, Venous: 1.8 mmol/L (ref 0.5–1.9)
Lactic Acid, Venous: 1.6 mmol/L (ref 0.5–1.9)

## 2021-01-01 LAB — BRAIN NATRIURETIC PEPTIDE: B Natriuretic Peptide: 2652 pg/mL — ABNORMAL HIGH (ref 0.0–100.0)

## 2021-01-01 MED ORDER — FUROSEMIDE 10 MG/ML IJ SOLN: 20.0000 mg | Freq: Once | INTRAMUSCULAR | Status: DC

## 2021-01-01 MED ORDER — TAMSULOSIN HCL 0.4 MG PO CAPS
0.4000 mg | ORAL_CAPSULE | Freq: Every day | ORAL | Status: DC
  Administered 2021-01-02 – 2021-01-13 (×12): 0.4 mg via ORAL
  Filled 2021-01-01 (×12): qty 1

## 2021-01-01 MED ORDER — ALLOPURINOL 100 MG PO TABS
100.0000 mg | ORAL_TABLET | Freq: Every day | ORAL | Status: DC
  Administered 2021-01-02 – 2021-01-13 (×12): 100 mg via ORAL
  Filled 2021-01-01 (×12): qty 1

## 2021-01-01 MED ORDER — INSULIN ASPART 100 UNIT/ML IJ SOLN
0.0000 [IU] | Freq: Three times a day (TID) | INTRAMUSCULAR | Status: DC
  Administered 2021-01-02: 1 [IU] via SUBCUTANEOUS
  Administered 2021-01-03 – 2021-01-04 (×5): 2 [IU] via SUBCUTANEOUS
  Administered 2021-01-05: 3 [IU] via SUBCUTANEOUS
  Administered 2021-01-05 – 2021-01-07 (×5): 2 [IU] via SUBCUTANEOUS
  Administered 2021-01-07 – 2021-01-08 (×2): 1 [IU] via SUBCUTANEOUS
  Administered 2021-01-08: 3 [IU] via SUBCUTANEOUS
  Administered 2021-01-09: 2 [IU] via SUBCUTANEOUS
  Administered 2021-01-09 – 2021-01-10 (×2): 3 [IU] via SUBCUTANEOUS
  Administered 2021-01-10: 2 [IU] via SUBCUTANEOUS

## 2021-01-01 MED ORDER — AMIODARONE HCL 100 MG PO TABS
100.0000 mg | ORAL_TABLET | Freq: Every day | ORAL | Status: DC
  Administered 2021-01-02: 100 mg via ORAL
  Filled 2021-01-01: qty 1

## 2021-01-01 MED ORDER — ACETAMINOPHEN 650 MG RE SUPP: 650.0000 mg | Freq: Four times a day (QID) | RECTAL | Status: DC | PRN

## 2021-01-01 MED ORDER — ATORVASTATIN CALCIUM 40 MG PO TABS
40.0000 mg | ORAL_TABLET | Freq: Every day | ORAL | Status: DC
  Administered 2021-01-02 – 2021-01-13 (×12): 40 mg via ORAL
  Filled 2021-01-01 (×12): qty 1

## 2021-01-01 MED ORDER — ESCITALOPRAM OXALATE 10 MG PO TABS
10.0000 mg | ORAL_TABLET | Freq: Every day | ORAL | Status: DC
  Administered 2021-01-02 – 2021-01-13 (×12): 10 mg via ORAL
  Filled 2021-01-01 (×12): qty 1

## 2021-01-01 MED ORDER — SODIUM CHLORIDE 0.9% FLUSH
3.0000 mL | INTRAVENOUS | Status: DC | PRN
  Administered 2021-01-05: 3 mL via INTRAVENOUS

## 2021-01-01 MED ORDER — APIXABAN 2.5 MG PO TABS
2.5000 mg | ORAL_TABLET | Freq: Two times a day (BID) | ORAL | Status: DC
  Administered 2021-01-01 – 2021-01-13 (×24): 2.5 mg via ORAL
  Filled 2021-01-01 (×24): qty 1

## 2021-01-01 MED ORDER — SODIUM CHLORIDE 0.9 % IV SOLN: 250.0000 mL | INTRAVENOUS | Status: DC | PRN

## 2021-01-01 MED ORDER — DAPAGLIFLOZIN PROPANEDIOL 10 MG PO TABS
10.0000 mg | ORAL_TABLET | Freq: Every day | ORAL | Status: DC
  Administered 2021-01-03 – 2021-01-13 (×11): 10 mg via ORAL
  Filled 2021-01-01 (×12): qty 1

## 2021-01-01 MED ORDER — LIDOCAINE 5 % EX PTCH: 1.0000 | MEDICATED_PATCH | Freq: Every day | CUTANEOUS | Status: DC | PRN

## 2021-01-01 MED ORDER — GABAPENTIN 100 MG PO CAPS
100.0000 mg | ORAL_CAPSULE | Freq: Two times a day (BID) | ORAL | Status: DC
  Administered 2021-01-01 – 2021-01-13 (×24): 100 mg via ORAL
  Filled 2021-01-01 (×24): qty 1

## 2021-01-01 MED ORDER — PRAMIPEXOLE DIHYDROCHLORIDE 0.25 MG PO TABS
0.2500 mg | ORAL_TABLET | ORAL | Status: DC
  Administered 2021-01-02 – 2021-01-12 (×11): 0.25 mg via ORAL
  Filled 2021-01-01 (×13): qty 1

## 2021-01-01 MED ORDER — FUROSEMIDE 10 MG/ML IJ SOLN: 40.0000 mg | Freq: Every day | INTRAMUSCULAR | Status: DC

## 2021-01-01 MED ORDER — INSULIN GLARGINE-YFGN 100 UNIT/ML ~~LOC~~ SOLN
30.0000 [IU] | Freq: Every day | SUBCUTANEOUS | Status: DC
  Administered 2021-01-01 – 2021-01-10 (×10): 30 [IU] via SUBCUTANEOUS
  Filled 2021-01-01 (×11): qty 0.3

## 2021-01-01 MED ORDER — FUROSEMIDE 10 MG/ML IJ SOLN
40.0000 mg | Freq: Once | INTRAMUSCULAR | Status: AC
  Administered 2021-01-01: 40 mg via INTRAVENOUS
  Filled 2021-01-01: qty 4

## 2021-01-01 MED ORDER — SODIUM CHLORIDE 0.9% FLUSH
3.0000 mL | Freq: Two times a day (BID) | INTRAVENOUS | Status: DC
  Administered 2021-01-01 – 2021-01-07 (×9): 3 mL via INTRAVENOUS

## 2021-01-01 MED ORDER — ACETAMINOPHEN 325 MG PO TABS
650.0000 mg | ORAL_TABLET | Freq: Four times a day (QID) | ORAL | Status: DC | PRN
  Administered 2021-01-02 – 2021-01-10 (×6): 650 mg via ORAL
  Filled 2021-01-01 (×6): qty 2

## 2021-01-01 NOTE — ED Provider Notes (Signed)
Emergency Medicine Provider Triage Evaluation Note  [name] , a 87 y.o. male  was evaluated in triage.  Pt complains of Shortness of breath.  He has been short of breath for the last week since being discharged from the hospital.  Worsened acutely today.  Patient recently diagnosed with right heart failure and COVID, having leg swelling.  Patient's wife reports he has been acting more confused. .  Review of Systems  Positive: Confusion, shortness of breath, lightheadedness, leg swelling Negative: Chest pain, fever  Physical Exam  BP (!) 88/61   Pulse 78   Resp 20   SpO2 97%  Gen:   Awake   Resp:  Normal effort  MSK:   Moves extremities without difficulty  Other:  Bilateral pitting edema  Medical Decision Making  Medically screening exam initiated at 2:37 PM.  Appropriate orders placed.  [name] was informed that the remainder of the evaluation will be completed by another provider, this initial triage assessment does not replace that evaluation, and the importance of remaining in the ED until their evaluation is complete.  Patient hypotensive, needs to go to the back.   [name] Raring, PA-C 01/01/21 1438    [name] Belling, MD 01/01/21 2024

## 2021-01-01 NOTE — ED Notes (Signed)
EDP at [facility] 
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
## 2021-01-01 NOTE — ED Triage Notes (Signed)
Wife stated, He is having SOB and swelling for a week. Some dizziness. Started about a week.
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
## 2021-01-01 NOTE — ED Notes (Signed)
"  Covid + since 10/8"

## 2021-01-01 NOTE — ED Notes (Signed)
Pt given sandwich bag, cheese, and ginger ale per request. Pt assisted to sit up in bed and food tray placed over stretcher to assist with eating. Pt denies any complaints. No acute changes noted. Will continue to monitor.

## 2021-01-02 ENCOUNTER — Telehealth: Payer: Self-pay | Admitting: Internal Medicine

## 2021-01-02 ENCOUNTER — Inpatient Hospital Stay (HOSPITAL_COMMUNITY): Payer: No Typology Code available for payment source

## 2021-01-02 ENCOUNTER — Other Ambulatory Visit: Payer: Self-pay

## 2021-01-02 ENCOUNTER — Telehealth: Payer: No Typology Code available for payment source | Admitting: Internal Medicine

## 2021-01-02 DIAGNOSIS — I5023 Acute on chronic systolic (congestive) heart failure: Secondary | ICD-10-CM | POA: Diagnosis not present

## 2021-01-02 LAB — CBG MONITORING, ED
Glucose-Capillary: 142 mg/dL — ABNORMAL HIGH (ref 70–99)
Glucose-Capillary: 110 mg/dL — ABNORMAL HIGH (ref 70–99)

## 2021-01-02 LAB — MAGNESIUM: Magnesium: 2.3 mg/dL (ref 1.7–2.4)

## 2021-01-02 LAB — PROCALCITONIN: Procalcitonin: 0.51 ng/mL

## 2021-01-02 LAB — CBC
HCT: 32.1 % — ABNORMAL LOW (ref 39.0–52.0)
Hemoglobin: 9 g/dL — ABNORMAL LOW (ref 13.0–17.0)
MCH: 24.7 pg — ABNORMAL LOW (ref 26.0–34.0)
MCHC: 28 g/dL — ABNORMAL LOW (ref 30.0–36.0)
MCV: 87.9 fL (ref 80.0–100.0)
Platelets: 221 10*3/uL (ref 150–400)
RBC: 3.65 MIL/uL — ABNORMAL LOW (ref 4.22–5.81)
RDW: 16.4 % — ABNORMAL HIGH (ref 11.5–15.5)
WBC: 15.1 10*3/uL — ABNORMAL HIGH (ref 4.0–10.5)
nRBC: 0 % (ref 0.0–0.2)

## 2021-01-02 LAB — BASIC METABOLIC PANEL
Anion gap: 10 (ref 5–15)
BUN: 67 mg/dL — ABNORMAL HIGH (ref 8–23)
CO2: 25 mmol/L (ref 22–32)
Calcium: 8.5 mg/dL — ABNORMAL LOW (ref 8.9–10.3)
Chloride: 103 mmol/L (ref 98–111)
Creatinine, Ser: 2.42 mg/dL — ABNORMAL HIGH (ref 0.61–1.24)
GFR, Estimated: 25 mL/min — ABNORMAL LOW (ref >60)
Glucose, Bld: 215 mg/dL — ABNORMAL HIGH (ref 70–99)
Potassium: 3.8 mmol/L (ref 3.5–5.1)
Sodium: 138 mmol/L (ref 135–145)

## 2021-01-02 LAB — GLUCOSE, CAPILLARY
Glucose-Capillary: 103 mg/dL — ABNORMAL HIGH (ref 70–99)
Glucose-Capillary: 56 mg/dL — ABNORMAL LOW (ref 70–99)
Glucose-Capillary: 128 mg/dL — ABNORMAL HIGH (ref 70–99)

## 2021-01-02 LAB — C-REACTIVE PROTEIN: CRP: 6.7 mg/dL — ABNORMAL HIGH (ref <1.0)

## 2021-01-02 MED ORDER — ISOSORBIDE MONONITRATE ER 30 MG PO TB24
15.0000 mg | ORAL_TABLET | Freq: Every day | ORAL | Status: DC
  Administered 2021-01-02 – 2021-01-13 (×12): 15 mg via ORAL
  Filled 2021-01-02 (×12): qty 1

## 2021-01-02 MED ORDER — POTASSIUM CHLORIDE CRYS ER 20 MEQ PO TBCR
20.0000 meq | EXTENDED_RELEASE_TABLET | Freq: Once | ORAL | Status: AC
  Administered 2021-01-02: 20 meq via ORAL
  Filled 2021-01-02: qty 1

## 2021-01-02 MED ORDER — ISOSORBIDE MONONITRATE ER 30 MG PO TB24: 30.0000 mg | ORAL_TABLET | Freq: Every day | ORAL | Status: DC

## 2021-01-02 MED ORDER — AZITHROMYCIN 250 MG PO TABS: 250.0000 mg | ORAL_TABLET | Freq: Every day | ORAL | Status: DC

## 2021-01-02 MED ORDER — AZITHROMYCIN 250 MG PO TABS: 500.0000 mg | ORAL_TABLET | Freq: Once | ORAL | Status: DC

## 2021-01-02 MED ORDER — VITAMIN B-12 1000 MCG PO TABS
1000.0000 ug | ORAL_TABLET | Freq: Every day | ORAL | Status: DC
  Administered 2021-01-02 – 2021-01-13 (×12): 1000 ug via ORAL
  Filled 2021-01-02 (×13): qty 1

## 2021-01-02 MED ORDER — DOXYCYCLINE HYCLATE 100 MG PO TABS
100.0000 mg | ORAL_TABLET | Freq: Two times a day (BID) | ORAL | Status: AC
  Administered 2021-01-02 – 2021-01-06 (×10): 100 mg via ORAL
  Filled 2021-01-02 (×10): qty 1

## 2021-01-02 MED ORDER — GUAIFENESIN ER 600 MG PO TB12
600.0000 mg | ORAL_TABLET | Freq: Two times a day (BID) | ORAL | Status: DC
  Administered 2021-01-02 – 2021-01-13 (×23): 600 mg via ORAL
  Filled 2021-01-02 (×23): qty 1

## 2021-01-02 MED ORDER — CARVEDILOL 3.125 MG PO TABS
3.1250 mg | ORAL_TABLET | Freq: Two times a day (BID) | ORAL | Status: DC
  Administered 2021-01-03 – 2021-01-04 (×3): 3.125 mg via ORAL
  Filled 2021-01-02 (×4): qty 1

## 2021-01-02 MED ORDER — FUROSEMIDE 10 MG/ML IJ SOLN: 60.0000 mg | Freq: Every day | INTRAMUSCULAR | Status: DC

## 2021-01-02 MED ORDER — CARVEDILOL 3.125 MG PO TABS: 3.1250 mg | ORAL_TABLET | Freq: Two times a day (BID) | ORAL | Status: DC

## 2021-01-02 MED ORDER — DIGOXIN 125 MCG PO TABS
0.0625 mg | ORAL_TABLET | ORAL | Status: DC
  Administered 2021-01-02 – 2021-01-04 (×2): 0.0625 mg via ORAL
  Filled 2021-01-02 (×2): qty 1

## 2021-01-02 MED ORDER — SPIRONOLACTONE 25 MG PO TABS
25.0000 mg | ORAL_TABLET | Freq: Every day | ORAL | Status: DC
  Administered 2021-01-02 – 2021-01-13 (×12): 25 mg via ORAL
  Filled 2021-01-02 (×12): qty 1

## 2021-01-02 MED ORDER — AMIODARONE HCL 200 MG PO TABS
200.0000 mg | ORAL_TABLET | Freq: Two times a day (BID) | ORAL | Status: DC
  Administered 2021-01-02 – 2021-01-03 (×3): 200 mg via ORAL
  Filled 2021-01-02 (×3): qty 1

## 2021-01-02 MED ORDER — FUROSEMIDE 10 MG/ML IJ SOLN
40.0000 mg | Freq: Every day | INTRAMUSCULAR | Status: DC
  Administered 2021-01-02: 40 mg via INTRAVENOUS
  Filled 2021-01-02: qty 4

## 2021-01-02 MED ORDER — LORATADINE 10 MG PO TABS
10.0000 mg | ORAL_TABLET | Freq: Every day | ORAL | Status: DC
  Administered 2021-01-02 – 2021-01-13 (×12): 10 mg via ORAL
  Filled 2021-01-02 (×12): qty 1

## 2021-01-02 NOTE — Progress Notes (Signed)
RT at bedside to obtain sputum sample. Pt stated he could not cough anything up. RT left specimen cup at bedside and instructed pt to cough sample into cup, pt understood.

## 2021-01-02 NOTE — Progress Notes (Addendum)
Physical Therapy Evaluation Patient Details Name: [name] MRN: [id number] DOB: 08/06/1928 Today's Date: 01/02/2021  History of Present Illness  87 y.o. [name] was admitted on 10/16 for a fall at home with pt hitting his head but cleared for fractures.  Had recent admission for positive covid test, still testing positive and SOB.  Has PVC's on telemetry, CHF exacerbation, LE edema.  PMHx:  CAD s/p CABG, CKD stage IIIb, DM2, ischemic cardiomyopathy with systolic and diastolic congestive heart failure, moderate aortic stenosis, paroxysmal atrial fibrillation, HTN, gout.  Clinical Impression  Pt was seen for careful mobility and then was discovered to have high PVC's on his telemetry monitor.  Pt is expected to be able to walk but will need to review more active mobility as his condition tolerates.  Pt is weak and tired, having just been moved from the ED, and will benefit from resting.  Given the limited assist needed thus far, will expect HHPT to be a reasonable dc plan, but will update if his condition warrants.  Follow for goals of acute PT as indicated below.      Recommendations for follow up therapy are one component of a multi-disciplinary discharge planning process, led by the attending physician.  Recommendations may be updated based on patient status, additional functional criteria and insurance authorization.  Follow Up Recommendations Home health PT    Equipment Recommendations  None recommended by PT    Recommendations for Other Services       Precautions / Restrictions Precautions Precautions: Fall Precaution Comments: PVC's, monitor O2 sats and HR Restrictions Weight Bearing Restrictions: No      Mobility  Bed Mobility Overal bed mobility: Needs Assistance Bed Mobility: Rolling Rolling: Min assist         General bed mobility comments: did not sit up as pt began to have PVC's    Transfers                 General transfer comment:  deferred  Ambulation/Gait             General Gait Details: deferred  Stairs            Wheelchair Mobility    Modified Rankin (Stroke Patients Only)       Balance                                             Pertinent Vitals/Pain Pain Assessment: No/denies pain    Home Living Family/patient expects to be discharged to:: Private residence Living Arrangements: Spouse/significant other Available Help at Discharge: Family Type of Home: House Home Access: Ramped entrance     Home Layout: One level Home Equipment: Environmental consultant - 2 wheels;Walker - 4 wheels;Cane - single point;Shower seat;Wheelchair - manual      Prior Function Level of Independence: Independent with assistive device(s)         Comments: rollator regularly per pt     Hand Dominance   Dominant Hand: Right    Extremity/Trunk Assessment   Upper Extremity Assessment Upper Extremity Assessment: Generalized weakness RUE Deficits / Details: Pt has low ROM on R shoulder to flex and abd    Lower Extremity Assessment Lower Extremity Assessment: Generalized weakness    Cervical / Trunk Assessment Cervical / Trunk Assessment: Kyphotic  Communication   Communication: No difficulties  Cognition Arousal/Alertness: Lethargic Behavior During Therapy: WFL for  tasks assessed/performed Overall Cognitive Status: Within Functional Limits for tasks assessed                                        General Comments General comments (skin integrity, edema, etc.): pt was in bed requiring limited help to roll and move but his telemetry began to pick up PVC's and so deferred getting OOB    Exercises     Assessment/Plan    PT Assessment Patient needs continued PT services  PT Problem List Decreased strength;Decreased activity tolerance;Decreased mobility;Cardiopulmonary status limiting activity       PT Treatment Interventions DME instruction;Gait training;Functional  mobility training;Therapeutic activities;Therapeutic exercise;Balance training;Neuromuscular re-education;Patient/family education    PT Goals (Current goals can be found in the Care Plan section)  Acute Rehab PT Goals Patient Stated Goal: feel better PT Goal Formulation: With patient Time For Goal Achievement: 01/16/21    Frequency Min 3X/week   Barriers to discharge Decreased caregiver support home with family, one person help    Co-evaluation               AM-PAC PT "6 Clicks" Mobility  Outcome Measure Help needed turning from your back to your side while in a flat bed without using bedrails?: A Little Help needed moving from lying on your back to sitting on the side of a flat bed without using bedrails?: A Little Help needed moving to and from a bed to a chair (including a wheelchair)?: A Little Help needed standing up from a chair using your arms (e.g., wheelchair or bedside chair)?: A Little Help needed to walk in hospital room?: A Little Help needed climbing 3-5 steps with a railing? : A Lot 6 Click Score: 17    End of Session Equipment Utilized During Treatment: Oxygen Activity Tolerance: Patient limited by fatigue;Treatment limited secondary to medical complications (Comment) Patient left: in bed;with call bell/phone within reach;with bed alarm set;with nursing/sitter in room Nurse Communication: Mobility status PT Visit Diagnosis: Muscle weakness (generalized) (M62.81);History of falling (Z91.81);Other (comment) (cardiac arrythmias)    Time: 1405-1440 PT Time Calculation (min) (ACUTE ONLY): 35 min   Charges:   PT Evaluation $PT Eval Moderate Complexity: 1 Mod PT Treatments $Therapeutic Activity: 8-22 mins       Ramond Dial 01/02/2021, 5:33 PM  Mee Hives, PT PhD Acute Rehab Dept. Number: [place] and [place]

## 2021-01-02 NOTE — ED Notes (Signed)
Pt gown and sheet wet from him turning over in bed and urine spilling from Primofit, checked placement and ok to leave in place. Pt gown and linen changed. Pt assisted into position of comfort and resting with eyes closed, respirations even and unlabored. No acute changes noted. Will continue to monitor.

## 2021-01-02 NOTE — Progress Notes (Signed)
PROGRESS NOTE                                                                                                                                                                                                             Patient Demographics:    [name], is a 87 y.o. male, DOB - 20-May-1928, WUX:[id number]  Outpatient Primary MD for the patient is Panosh, Standley Brooking, MD    LOS - 1  Admit date - 01/01/2021    Chief Complaint  Patient presents with   Shortness of Breath   Dizziness   Leg Swelling       Brief Narrative (HPI from H&P)  - : [name] is a 87 y.o. male with medical history significant of   coronary artery disease status post CABG, chronic kidney disease stage IIIb, diabetes mellitus type 2, ischemic cardiomyopathy with systolic and diastolic congestive heart failure (Echo 07/2020 EF 20-25% with G1DD), moderate aortic stenosis (valve area 1.13cmsq), paroxysmal atrial fibrillation (S/P DCCV 05/2014), hypertension, restless leg syndrome, sleep apnea on CPAP, IDA, gout, depression who presented to ED with worsening shortness of breath.  Recently discharged on 12/27/2020 for CHF exacerbation and has continued to become progressively more short of breath since this time, he developed shortness of breath along with some dry cough, work-up suggestive of bronchitis along with underlying CHF and he was admitted to the hospital.   Subjective:    [name] today has, No headache, No chest pain, No abdominal pain - No Nausea, No new weakness tingling or numbness, +ve cough and SOB   Assessment  & Plan :     Acute Hypoxic Resp. Failure due bacterial bronchitis along with underlying chronic systolic CHF - he does not seem to have an active COVID-19 infection he was recently treated for it, can come off of isolation on 01/04/2021 from the standpoint.  He will be placed on bronchitis, he does have lot of coarse upper airway  breath sounds for which he has been placed on Mucinex, flutter valve and suctioning.  Nursing staff has been informed.  We will keep his head of the bed elevated.  CHF treatment as below.  Continue supportive care with albuterol inhalers along with oxygen as needed.  Encouraged the patient to sit up in chair in the daytime use I-S and flutter valve for  pulmonary toiletry.  Will advance activity and titrate down oxygen as possible.   SpO2: 100 % O2 Flow Rate (L/min): 2 L/min    Recent COVID-19 virus infection  - Patient tested positive on December 24, 2020 and received monoclonal antibody infusion as well as remdesivir at last hospitalization. Stable, off Isolation 01/04/21.  Acute on chronic systolic CHF EF 32% - not overtly congested, mild to trace edema, mild to no orthopnea - Lasix IV, Aldactone, low dose Coreg, Imdur and Farxiga as tolerated by BP.  Acute renal failure superimposed on stage 3b chronic kidney disease (HCC) - baseline creatinine around 2.15, improving with diuresis, monitor closely, no ACE/ARB.   CAD (coronary artery disease)/HLD - stable continue home medication.  Paroxysmal atrial fibrillation - CHD VASC 2 > 3  - Coreg, Digoxin + Eliquis.  Mild Non ACS pattern rise in Troponin - not ACS this is due to demand mismatch from CHF, trend is stable no chest pain.  No further work-up.  Essential hypertension - BP soft, cut down Coreg and Imdur, Lasix + Aldactone as tolerated.   Obstructive sleep apnea  - Continue CPAP QHS.   Iron deficiency anemia - Continue to monitor   Restless Leg Syndrome  - Continue Mirapex  Diabetes mellitus type 2, uncontrolled (HCC)  - on ISS + Lantus  Lab Results  Component Value Date   HGBA1C 8.5 (H) 12/26/2020    CBG (last 3)  Recent Labs    01/01/21 2202 01/02/21 0832  GLUCAP 205* 142*      Recent Labs  Lab 12/27/20 0402 01/01/21 1434 01/01/21 1439 01/01/21 1640 01/02/21 0439 01/02/21 0808  WBC 6.5  --  13.6*  --  15.1*   --   HGB 9.7*  --  9.1*  --  9.0*  --   HCT 32.6*  --  30.9*  --  32.1*  --   PLT 192  --  220  --  221  --   CRP 2.4*  --   --   --   --  6.7*  BNP 2,773.2*  --  2,652.0*  --   --   --   DDIMER 0.68*  --   --   --   --   --   PROCALCITON  --   --   --   --   --  0.51  AST 24  --   --   --   --   --   ALT 40  --   --   --   --   --   ALKPHOS 79  --   --   --   --   --   BILITOT 0.5  --   --   --   --   --   ALBUMIN 2.7*  --   --   --   --   --   LATICACIDVEN  --   --  1.8 1.6  --   --   SARSCOV2NAA  --  POSITIVE*  --   --   --   --            Condition - Fair  Family Communication  :    wife Florian Buff [phone] updated on 01/02/21  Sharrell Ku daughter [phone]    Code Status :  DNR  Consults  :  Cards  PUD Prophylaxis :    Procedures  :            Disposition Plan  :  Status is: Inpatient  Remains inpatient appropriate because: Resp failure  DVT Prophylaxis  :    apixaban (ELIQUIS) tablet 2.5 mg Start: 01/01/21 2200 apixaban (ELIQUIS) tablet 2.5 mg     Lab Results  Component Value Date   PLT 221 01/02/2021    Diet :  Diet Order             Diet heart healthy/carb modified Room service appropriate? Yes; Fluid consistency: Thin  Diet effective now                    Inpatient Medications  Scheduled Meds:  allopurinol  100 mg Oral Daily   amiodarone  100 mg Oral Daily   apixaban  2.5 mg Oral BID   atorvastatin  40 mg Oral Daily   [START ON 01/03/2021] azithromycin  250 mg Oral Daily   azithromycin  500 mg Oral Once   carvedilol  3.125 mg Oral BID WC   dapagliflozin propanediol  10 mg Oral QAC breakfast   digoxin  0.0625 mg Oral Q M,W,F   escitalopram  10 mg Oral Daily   furosemide  40 mg Intravenous Daily   gabapentin  100 mg Oral BID   guaiFENesin  600 mg Oral BID   insulin aspart  0-9 Units Subcutaneous TID WC   insulin glargine-yfgn  30 Units Subcutaneous QHS   isosorbide mononitrate  15 mg Oral Daily   loratadine  10  mg Oral Daily   pramipexole  0.25 mg Oral Q24H   sodium chloride flush  3 mL Intravenous Q12H   spironolactone  25 mg Oral Daily   tamsulosin  0.4 mg Oral QPC breakfast   vitamin B-12  1,000 mcg Oral Daily   Continuous Infusions:  sodium chloride     PRN Meds:.sodium chloride, acetaminophen **OR** acetaminophen, lidocaine, sodium chloride flush  Antibiotics  :    Anti-infectives (From admission, onward)    Start     Dose/Rate Route Frequency Ordered Stop   01/03/21 1000  azithromycin (ZITHROMAX) tablet 250 mg        250 mg Oral Daily 01/02/21 1049 01/07/21 0959   01/02/21 1100  azithromycin (ZITHROMAX) tablet 500 mg        500 mg Oral  Once 01/02/21 1048          Time Spent in minutes  30   Lala Lund M.D on 01/02/2021 at 10:52 AM  To page go to [email or web address]   [facility] -  Office  [phone]  See all Orders from today for further details    Objective:   Vitals:   01/02/21 0630 01/02/21 0900 01/02/21 1012 01/02/21 1030  BP: 106/67 116/70  101/61  Pulse: 70 70 75 77  Resp: 16 17  19   Temp:      TempSrc:      SpO2: 100% 100%  100%    Wt Readings from Last 3 Encounters:  12/27/20 76.9 kg  11/17/20 77.5 kg  10/28/20 77.5 kg     Intake/Output Summary (Last 24 hours) at 01/02/2021 1052 Last data filed at 01/02/2021 0230 Gross per 24 hour  Intake --  Output 900 ml  Net -900 ml     Physical Exam  Awake Alert, No new F.N deficits, Normal affect [place].AT,PERRAL Supple Neck, No JVD,   Symmetrical Chest wall movement, Good air movement bilaterally, few coarse breath sounds RRR,No Gallops,Rubs or new Murmurs,  +ve B.Sounds, Abd Soft, No tenderness,   No Cyanosis, trace edema  RN pressure injury documentation: Pressure Injury 12/26/20 Arm Anterior;Left;Proximal;Upper skin tear (Active)  12/26/20 0106  Location: Arm  Location Orientation: Anterior;Left;Proximal;Upper  Staging:   Wound Description (Comments): skin tear  Present on  Admission: Yes     Data Review:    CBC Recent Labs  Lab 12/27/20 0402 01/01/21 1439 01/02/21 0439  WBC 6.5 13.6* 15.1*  HGB 9.7* 9.1* 9.0*  HCT 32.6* 30.9* 32.1*  PLT 192 220 221  MCV 86.0 86.1 87.9  MCH 25.6* 25.3* 24.7*  MCHC 29.8* 29.4* 28.0*  RDW 16.1* 16.4* 16.4*  LYMPHSABS 1.8 1.0  --   MONOABS 0.6 0.9  --   EOSABS 0.1 0.0  --   BASOSABS 0.0 0.0  --     Recent Labs  Lab 12/27/20 0402 01/01/21 1439 01/01/21 1640 01/01/21 2046 01/02/21 0439 01/02/21 0808  NA 139 138  --   --  138  --   K 4.4 4.2  --   --  3.8  --   CL 104 102  --   --  103  --   CO2 26 25  --   --  25  --   GLUCOSE 92 253*  --   --  215*  --   BUN 49* 70*  --   --  67*  --   CREATININE 2.05* 2.64*  --   --  2.42*  --   CALCIUM 8.1* 8.6*  --   --  8.5*  --   AST 24  --   --   --   --   --   ALT 40  --   --   --   --   --   ALKPHOS 79  --   --   --   --   --   BILITOT 0.5  --   --   --   --   --   ALBUMIN 2.7*  --   --   --   --   --   MG 2.0  --   --  2.2  --  2.3  CRP 2.4*  --   --   --   --  6.7*  DDIMER 0.68*  --   --   --   --   --   PROCALCITON  --   --   --   --   --  0.51  LATICACIDVEN  --  1.8 1.6  --   --   --   BNP 2,458.0* 2,652.0*  --   --   --   --     ------------------------------------------------------------------------------------------------------------------ No results for input(s): CHOL, HDL, LDLCALC, TRIG, CHOLHDL, LDLDIRECT in the last 72 hours.  Lab Results  Component Value Date   HGBA1C 8.5 (H) 12/26/2020   ------------------------------------------------------------------------------------------------------------------ No results for input(s): TSH, T4TOTAL, T3FREE, THYROIDAB in the last 72 hours.  Invalid input(s): FREET3  Cardiac Enzymes No results for input(s): CKMB, TROPONINI, MYOGLOBIN in the last 168 hours.  Invalid input(s): CK ------------------------------------------------------------------------------------------------------------------     Component Value Date/Time   BNP 2,652.0 (H) 01/01/2021 1439     Radiology Reports CT Head Wo Contrast  Result Date: 01/01/2021 CLINICAL DATA:  Head trauma, mod-severe fall, hematoma to crown of head EXAM: CT HEAD WITHOUT CONTRAST TECHNIQUE: Contiguous axial images were obtained from the base of the skull through the vertex without intravenous contrast. COMPARISON:  12/26/2020 FINDINGS: Brain: There is atrophy and chronic small vessel disease changes. No acute intracranial abnormality. Specifically, no hemorrhage,  hydrocephalus, mass lesion, acute infarction, or significant intracranial injury. Vascular: No hyperdense vessel or unexpected calcification. Skull: No acute calvarial abnormality. Sinuses/Orbits: No acute findings Other: None IMPRESSION: Atrophy, chronic microvascular disease. No acute intracranial abnormality. Electronically Signed   By: Rolm Baptise M.D.   On: 01/01/2021 16:43     DG Chest Port 1 View  Result Date: 01/02/2021 CLINICAL DATA:  Reason for exam: sob Covid + Patient reports sob without any chest pains. Hx of afib, chf, diabetes, htn, prior MI 2005. Hx of defibrillator, cabg x3 2005. EXAM: PORTABLE CHEST - 1 VIEW COMPARISON:  01/01/2021 FINDINGS: Left subclavian AICD stable. Relatively low lung volumes with some crowding of perihilar and bibasilar bronchovascular structures. No confluent airspace disease. Stable borderline cardiomegaly. CABG markers. Aortic Atherosclerosis (ICD10-170.0). No effusion.  No pneumothorax. Sternotomy wires. Right shoulder DJD. Cholecystectomy clips. vertebral endplate spurring at multiple levels in the lower thoracic spine. IMPRESSION: Low volumes.  Stable chronic and postop changes as above. Electronically Signed   By: Lucrezia Europe M.D.   On: 01/02/2021 07:33   DG Chest Portable 1 View  Result Date: 01/01/2021 CLINICAL DATA:  Shortness of breath EXAM: PORTABLE CHEST 1 VIEW COMPARISON:  12/27/2020 FINDINGS: Left AICD remains in place, unchanged.  Prior CABG. Cardiomegaly, vascular congestion. No overt edema. No confluent opacities or effusions. IMPRESSION: Cardiomegaly, vascular congestion. Electronically Signed   By: Rolm Baptise M.D.   On: 01/01/2021 15:46

## 2021-01-02 NOTE — ED Notes (Signed)
Pt given ginger ale and graham crackers per request. No acute changes noted. Will continue to monitor.
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
## 2021-01-02 NOTE — Plan of Care (Signed)

## 2021-01-02 NOTE — ED Notes (Signed)
Pt daughter Clarene Critchley updated.
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
## 2021-01-02 NOTE — Telephone Encounter (Signed)
Kenney Houseman, a nurse with [facility], is needing to notify Dr.Panosh when they will be doing an admission visit for patient. The original admission date was 01/02/2021 but it has been moved to 01/01/2021. No call back needed. Notification only.  Please advise.

## 2021-01-02 NOTE — ED Notes (Signed)
Sharrell Ku daughter [phone] requesting an update on the patient

## 2021-01-02 NOTE — ED Notes (Signed)
Pt resting on stretcher with eyes closed, respirations even and unlabored. No acute changes noted. Will continue to monitor.

## 2021-01-03 ENCOUNTER — Encounter (HOSPITAL_COMMUNITY): Payer: Self-pay | Admitting: Family Medicine

## 2021-01-03 DIAGNOSIS — I5023 Acute on chronic systolic (congestive) heart failure: Secondary | ICD-10-CM | POA: Diagnosis not present

## 2021-01-03 LAB — CBC WITH DIFFERENTIAL/PLATELET
Abs Immature Granulocytes: 0.15 10*3/uL — ABNORMAL HIGH (ref 0.00–0.07)
Basophils Absolute: 0 10*3/uL (ref 0.0–0.1)
Basophils Relative: 0 %
Eosinophils Absolute: 0 10*3/uL (ref 0.0–0.5)
Eosinophils Relative: 0 %
HCT: 32.2 % — ABNORMAL LOW (ref 39.0–52.0)
Hemoglobin: 9.4 g/dL — ABNORMAL LOW (ref 13.0–17.0)
Immature Granulocytes: 1 %
Lymphocytes Relative: 7 %
Lymphs Abs: 1 10*3/uL (ref 0.7–4.0)
MCH: 25.1 pg — ABNORMAL LOW (ref 26.0–34.0)
MCHC: 29.2 g/dL — ABNORMAL LOW (ref 30.0–36.0)
MCV: 85.9 fL (ref 80.0–100.0)
Monocytes Absolute: 1.1 10*3/uL — ABNORMAL HIGH (ref 0.1–1.0)
Monocytes Relative: 8 %
Neutro Abs: 12.5 10*3/uL — ABNORMAL HIGH (ref 1.7–7.7)
Neutrophils Relative %: 84 %
Platelets: 230 10*3/uL (ref 150–400)
RBC: 3.75 MIL/uL — ABNORMAL LOW (ref 4.22–5.81)
RDW: 16.5 % — ABNORMAL HIGH (ref 11.5–15.5)
WBC: 14.9 10*3/uL — ABNORMAL HIGH (ref 4.0–10.5)
nRBC: 0 % (ref 0.0–0.2)

## 2021-01-03 LAB — GLUCOSE, CAPILLARY
Glucose-Capillary: 85 mg/dL (ref 70–99)
Glucose-Capillary: 187 mg/dL — ABNORMAL HIGH (ref 70–99)
Glucose-Capillary: 167 mg/dL — ABNORMAL HIGH (ref 70–99)
Glucose-Capillary: 173 mg/dL — ABNORMAL HIGH (ref 70–99)

## 2021-01-03 LAB — COMPREHENSIVE METABOLIC PANEL
ALT: 24 U/L (ref 0–44)
AST: 14 U/L — ABNORMAL LOW (ref 15–41)
Albumin: 2.4 g/dL — ABNORMAL LOW (ref 3.5–5.0)
Alkaline Phosphatase: 79 U/L (ref 38–126)
Anion gap: 9 (ref 5–15)
BUN: 64 mg/dL — ABNORMAL HIGH (ref 8–23)
CO2: 26 mmol/L (ref 22–32)
Calcium: 8.5 mg/dL — ABNORMAL LOW (ref 8.9–10.3)
Chloride: 104 mmol/L (ref 98–111)
Creatinine, Ser: 2.23 mg/dL — ABNORMAL HIGH (ref 0.61–1.24)
GFR, Estimated: 27 mL/min — ABNORMAL LOW (ref >60)
Glucose, Bld: 94 mg/dL (ref 70–99)
Potassium: 4.2 mmol/L (ref 3.5–5.1)
Sodium: 139 mmol/L (ref 135–145)
Total Bilirubin: 0.8 mg/dL (ref 0.3–1.2)
Total Protein: 5.2 g/dL — ABNORMAL LOW (ref 6.5–8.1)

## 2021-01-03 LAB — BRAIN NATRIURETIC PEPTIDE: B Natriuretic Peptide: 2227.4 pg/mL — ABNORMAL HIGH (ref 0.0–100.0)

## 2021-01-03 LAB — C-REACTIVE PROTEIN: CRP: 9.8 mg/dL — ABNORMAL HIGH (ref <1.0)

## 2021-01-03 LAB — PROCALCITONIN: Procalcitonin: 0.42 ng/mL

## 2021-01-03 LAB — MAGNESIUM: Magnesium: 2 mg/dL (ref 1.7–2.4)

## 2021-01-03 MED ORDER — FUROSEMIDE 10 MG/ML IJ SOLN
60.0000 mg | Freq: Two times a day (BID) | INTRAMUSCULAR | Status: DC
  Administered 2021-01-03 (×2): 60 mg via INTRAVENOUS
  Filled 2021-01-03 (×2): qty 6

## 2021-01-03 MED ORDER — HYDRALAZINE HCL 25 MG PO TABS
12.5000 mg | ORAL_TABLET | Freq: Three times a day (TID) | ORAL | Status: DC
  Administered 2021-01-03 – 2021-01-04 (×4): 12.5 mg via ORAL
  Filled 2021-01-03 (×5): qty 1

## 2021-01-03 MED ORDER — HYDRALAZINE HCL 10 MG PO TABS: 10.0000 mg | ORAL_TABLET | Freq: Three times a day (TID) | ORAL | Status: DC

## 2021-01-03 NOTE — Progress Notes (Signed)
01-03-21 CSW completed online 72-hour notification for [facility] Authorization- [phone]  Hays Dunnigan P, MSW, [employer] Social Worker [place] notification ID [id number] submitted online through [place] portal.   [place], MSW, LCSWA [phone] Heart Failure Social Worker

## 2021-01-03 NOTE — Consult Note (Signed)
   [facility] CM Inpatient Consult   01/03/2021  [name] 02/01/1929 [id number]  [place] [facility] [ACO] Patient: Medicare [place] [employer]  Primary Care provider:  Burnis Medin, MD, [place], an Embedded provider with a Chronic Care Management program and team, listed for [facility]  Patient screened for less than 7 days readmission hospitalization with noted extreme high risk score for unplanned readmission risk. Review of patient's medical record reveals patient is being recommended for home with home health.   Plan:  Continue to follow progress and disposition to assess for post hospital care management needs.    For questions contact:   Natividad Brood, RN BSN [facility] Liaison  [phone] business mobile phone Toll free office [phone]  Fax number: [phone] [email or web address]

## 2021-01-03 NOTE — Progress Notes (Signed)
Patient ID: [name], male   DOB: 21-Aug-1928, 87 y.o.   MRN: [id number]     Advanced Heart Failure Rounding Note  PCP-Cardiologist: None   Subjective:    Coughing this morning.  Still feels short of breath with ambulation.  Remains in atrial fibrillation with controlled rate.    Objective:   Weight Range: 78.1 kg Body mass index is 22.11 kg/m.   Vital Signs:   Temp:  [97.5 F (36.4 C)-97.8 F (36.6 C)] 97.8 F (36.6 C) (10/18 0841) Pulse Rate:  [67-85] 85 (10/18 0841) Resp:  [16-20] 20 (10/18 0841) BP: (101-116)/(51-70) 110/69 (10/18 0841) SpO2:  [96 %-100 %] 97 % (10/18 0841) Weight:  [78.1 kg] 78.1 kg (10/18 0629) Last BM Date: 12/31/20  Weight change: Filed Weights   01/02/21 1436 01/03/21 0629  Weight: 78.1 kg 78.1 kg    Intake/Output:   Intake/Output Summary (Last 24 hours) at 01/03/2021 0859 Last data filed at 01/03/2021 0630 Gross per 24 hour  Intake --  Output 1000 ml  Net -1000 ml      Physical Exam    General:  Well appearing. No resp difficulty HEENT: Normal Neck: Supple. JVP 14 cm. Carotids 2+ bilat; no bruits. No lymphadenopathy or thyromegaly appreciated. Cor: PMI nondisplaced. Irregular rate & rhythm. 2/6 early SEM RUSB.  Lungs: Rhonchi bilaterally.  Abdomen: Soft, nontender, nondistended. No hepatosplenomegaly. No bruits or masses. Good bowel sounds. Extremities: No cyanosis, clubbing, rash. 1+ ankle edema.  Neuro: Alert & orientedx3, cranial nerves grossly intact. moves all 4 extremities w/o difficulty. Affect pleasant   Telemetry   Atrial fibrillation 80s (personally reviewed)  Labs    CBC Recent Labs    01/01/21 1439 01/02/21 0439 01/03/21 0403  WBC 13.6* 15.1* 14.9*  NEUTROABS 11.6*  --  12.5*  HGB 9.1* 9.0* 9.4*  HCT 30.9* 32.1* 32.2*  MCV 86.1 87.9 85.9  PLT 220 221 211   Basic Metabolic Panel Recent Labs    01/02/21 0439 01/02/21 0808 01/03/21 0403  NA 138  --  139  K 3.8  --  4.2  CL 103  --  104  CO2  25  --  26  GLUCOSE 215*  --  94  BUN 67*  --  64*  CREATININE 2.42*  --  2.23*  CALCIUM 8.5*  --  8.5*  MG  --  2.3 2.0   Liver Function Tests Recent Labs    01/03/21 0403  AST 14*  ALT 24  ALKPHOS 79  BILITOT 0.8  PROT 5.2*  ALBUMIN 2.4*   No results for input(s): LIPASE, AMYLASE in the last 72 hours. Cardiac Enzymes No results for input(s): CKTOTAL, CKMB, CKMBINDEX, TROPONINI in the last 72 hours.  BNP: BNP (last 3 results) Recent Labs    12/27/20 0402 01/01/21 1439 01/03/21 0403  BNP 2,773.2* 2,652.0* 2,227.4*    ProBNP (last 3 results) No results for input(s): PROBNP in the last 8760 hours.   D-Dimer No results for input(s): DDIMER in the last 72 hours. Hemoglobin A1C No results for input(s): HGBA1C in the last 72 hours. Fasting Lipid Panel No results for input(s): CHOL, HDL, LDLCALC, TRIG, CHOLHDL, LDLDIRECT in the last 72 hours. Thyroid Function Tests No results for input(s): TSH, T4TOTAL, T3FREE, THYROIDAB in the last 72 hours.  Invalid input(s): FREET3  Other results:   Imaging    No results found.   Medications:     Scheduled Medications:  allopurinol  100 mg Oral Daily   amiodarone  200 mg Oral BID   apixaban  2.5 mg Oral BID   atorvastatin  40 mg Oral Daily   carvedilol  3.125 mg Oral BID WC   dapagliflozin propanediol  10 mg Oral QAC breakfast   digoxin  0.0625 mg Oral Q M,W,F   doxycycline  100 mg Oral Q12H   escitalopram  10 mg Oral Daily   furosemide  60 mg Intravenous BID   gabapentin  100 mg Oral BID   guaiFENesin  600 mg Oral BID   hydrALAZINE  12.5 mg Oral Q8H   insulin aspart  0-9 Units Subcutaneous TID WC   insulin glargine-yfgn  30 Units Subcutaneous QHS   isosorbide mononitrate  15 mg Oral Daily   loratadine  10 mg Oral Daily   pramipexole  0.25 mg Oral Q24H   sodium chloride flush  3 mL Intravenous Q12H   spironolactone  25 mg Oral Daily   tamsulosin  0.4 mg Oral QPC breakfast   vitamin B-12  1,000 mcg Oral  Daily    Infusions:  sodium chloride      PRN Medications: sodium chloride, acetaminophen **OR** acetaminophen, lidocaine, sodium chloride flush    Assessment/Plan   1. Acute on chronic systolic CHF: Ischemic cardiomyopathy.  Echo in 5/22 with EF 20-25%, low gradient moderate AS with mean gradient 12 mmHg and AVA 1.1. St Jude CRT-D device.  CHF exacerbation in setting of recurrent atrial fibrillation and loss of effective CRT.  On exam, he is volume overloaded.  Creatinine lower 2.4 => 2.2.  - Lasix 60 mg IV bid today and follow I/Os.  - Digoxin level ok, can continue low dose digoxin as long as creatinine remains stable.  - Continue Farxiga 10 mg daily.  - Continue spironolactone 25 daily.  - Continue Coreg 3.125 mg bid.  - Hydralazine 12.5 tid + Imdur 15.   - Needs to regain NSR, see below.  2. Atrial fibrillation: Paroxysmal.  Went into AF on 10/9, likely triggered by COVID-19 infection.  Loss of effective CRT with AF.   - Increased amiodarone to 200 mg bid.  - On Eliquis 2.5 bid and has not missed doses.  - Aim for DCCV tomorrow if he diureses well today.  Discussed risks/benefits with patient and he agrees to procedure.  3. CKD stage 4: Baseline creatinine around 2.  Creatinine 2.2 today.  - Follow closely with diuresis.  4. COVID-19: Admitted initially 10/8 with COVID-19. Received monoclonal ab and remdesivir initially. Still with cough though CHF likely contributes to this.  5. CAD: s/p CABG.  No chest pain.  HS-TnI minimally elevated with no trend, likely demand ischemia.  - Continue atorvastatin. - Not on ASA with stable CAD and apixaban use.    Length of Stay: 2  [name], MD  01/03/2021, 8:59 AM  Advanced Heart Failure Team Pager [phone] (M-F; 7a - 5p)  Please contact [facility] for night-coverage after hours (5p -7a ) and weekends on [email or web address]

## 2021-01-03 NOTE — Progress Notes (Signed)
PROGRESS NOTE                                                                                                                                                                                                             Patient Demographics:    [name], is a 87 y.o. male, DOB - [birth year], XTG:[id number]  Outpatient Primary MD for the patient is Panosh, Standley Brooking, MD    LOS - 2  Admit date - 01/01/2021    Chief Complaint  Patient presents with   Shortness of Breath   Dizziness   Leg Swelling       Brief Narrative (HPI from H&P)  - : [name] is a 87 y.o. male with medical history significant of   coronary artery disease status post CABG, chronic kidney disease stage IIIb, diabetes mellitus type 2, ischemic cardiomyopathy with systolic and diastolic congestive heart failure (Echo 07/2020 EF 20-25% with G1DD), moderate aortic stenosis (valve area 1.13cmsq), paroxysmal atrial fibrillation (S/P DCCV 05/2014), hypertension, restless leg syndrome, sleep apnea on CPAP, IDA, gout, depression who presented to ED with worsening shortness of breath.  Recently discharged on 12/27/2020 for CHF exacerbation and has continued to become progressively more short of breath since this time, he developed shortness of breath along with some dry cough, work-up suggestive of bronchitis along with underlying CHF and he was admitted to the hospital.   Subjective:   Patient in chair, appears comfortable, denies any headache, no fever, no chest pain or pressure,mild cough and improvedshortness of breath , no abdominal pain. No new focal weakness.    Assessment  & Plan :     Acute Hypoxic Resp. Failure due bacterial bronchitis along with underlying acute on chronic systolic CHF - he does not seem to have an active COVID-19 infection he was recently treated for it, can come off of isolation on 01/04/2021 from the standpoint.  He will be placed  on bronchitis, he does have lot of coarse upper airway breath sounds for which he has been placed on Azithromycin x 5 days, Mucinex, flutter valve and suctioning.  Nursing staff has been informed.  We will keep his head of the bed elevated.  CHF treatment as below.  Continue supportive care with albuterol inhalers along with oxygen as needed. Clinically better on 01/03/21. SLP to see as well.  Encouraged the patient to sit up in chair in the daytime use I-S and flutter valve for pulmonary toiletry.  Will advance activity and titrate down oxygen as possible.   SpO2: 97 % O2 Flow Rate (L/min): 2 L/min    Recent COVID-19 virus infection  - Patient tested positive on December 24, 2020 and received monoclonal antibody infusion as well as remdesivir at last hospitalization. Stable, off Isolation 01/04/21.  Acute on chronic systolic CHF EF 48% - not overtly congested, mild to trace edema, no orthopnea - Lasix IV, Aldactone, low dose Coreg, Imdur and Farxiga as tolerated by BP. CHF team following.  Acute renal failure superimposed on stage 3b chronic kidney disease (HCC) - baseline creatinine around 2.15, improving with diuresis, monitor closely, no ACE/ARB.   CAD (coronary artery disease)/HLD - stable continue home medication.  Paroxysmal atrial fibrillation - CHD VASC 2 > 3  - Coreg, Digoxin + Eliquis.  Mild Non ACS pattern rise in Troponin - not ACS this is due to demand mismatch from CHF, trend is stable no chest pain.  No further work-up.  Essential hypertension - BP soft, cut down Coreg and Imdur, Lasix + Aldactone as tolerated.   Obstructive sleep apnea  - Continue CPAP QHS.   Iron deficiency anemia - Continue to monitor   Restless Leg Syndrome  - Continue Mirapex  Diabetes mellitus type 2, uncontrolled (HCC)  - on ISS + Lantus  Lab Results  Component Value Date   HGBA1C 8.5 (H) 12/26/2020    CBG (last 3)  Recent Labs    01/02/21 1718 01/02/21 2135 01/03/21 0623  GLUCAP 103*  128* 85      Recent Labs  Lab 01/01/21 1434 01/01/21 1439 01/01/21 1640 01/02/21 0439 01/02/21 0808 01/03/21 0403  WBC  --  13.6*  --  15.1*  --  14.9*  HGB  --  9.1*  --  9.0*  --  9.4*  HCT  --  30.9*  --  32.1*  --  32.2*  PLT  --  220  --  221  --  230  CRP  --   --   --   --  6.7* 9.8*  BNP  --  2,652.0*  --   --   --  2,227.4*  PROCALCITON  --   --   --   --  0.51 0.42  AST  --   --   --   --   --  14*  ALT  --   --   --   --   --  24  ALKPHOS  --   --   --   --   --  79  BILITOT  --   --   --   --   --  0.8  ALBUMIN  --   --   --   --   --  2.4*  LATICACIDVEN  --  1.8 1.6  --   --   --   SARSCOV2NAA POSITIVE*  --   --   --   --   --            Condition - Fair  Family Communication  :    wife [name] [phone] updated on 01/02/21  Sharrell Ku daughter [phone] 01/01/21, 01/02/21   Code Status :  DNR  Consults  :  Cards  PUD Prophylaxis :    Procedures  :            Disposition Plan  :  Status is: Inpatient  Remains inpatient appropriate because: Resp failure  DVT Prophylaxis  :    Place TED hose Start: 01/02/21 1518 apixaban (ELIQUIS) tablet 2.5 mg Start: 01/01/21 2200 apixaban (ELIQUIS) tablet 2.5 mg     Lab Results  Component Value Date   PLT 230 01/03/2021    Diet :  Diet Order             Diet heart healthy/carb modified Room service appropriate? Yes; Fluid consistency: Thin  Diet effective now                    Inpatient Medications  Scheduled Meds:  allopurinol  100 mg Oral Daily   amiodarone  200 mg Oral BID   apixaban  2.5 mg Oral BID   atorvastatin  40 mg Oral Daily   carvedilol  3.125 mg Oral BID WC   dapagliflozin propanediol  10 mg Oral QAC breakfast   digoxin  0.0625 mg Oral Q M,W,F   doxycycline  100 mg Oral Q12H   escitalopram  10 mg Oral Daily   furosemide  60 mg Intravenous BID   gabapentin  100 mg Oral BID   guaiFENesin  600 mg Oral BID   hydrALAZINE  12.5 mg Oral Q8H   insulin  aspart  0-9 Units Subcutaneous TID WC   insulin glargine-yfgn  30 Units Subcutaneous QHS   isosorbide mononitrate  15 mg Oral Daily   loratadine  10 mg Oral Daily   pramipexole  0.25 mg Oral Q24H   sodium chloride flush  3 mL Intravenous Q12H   spironolactone  25 mg Oral Daily   tamsulosin  0.4 mg Oral QPC breakfast   vitamin B-12  1,000 mcg Oral Daily   Continuous Infusions:  sodium chloride     PRN Meds:.sodium chloride, acetaminophen **OR** acetaminophen, lidocaine, sodium chloride flush  Antibiotics  :    Anti-infectives (From admission, onward)    Start     Dose/Rate Route Frequency Ordered Stop   01/03/21 1000  azithromycin (ZITHROMAX) tablet 250 mg  Status:  Discontinued        250 mg Oral Daily 01/02/21 1049 01/02/21 1119   01/02/21 1130  doxycycline (VIBRA-TABS) tablet 100 mg        100 mg Oral Every 12 hours 01/02/21 1119 01/07/21 0959   01/02/21 1100  azithromycin (ZITHROMAX) tablet 500 mg  Status:  Discontinued        500 mg Oral  Once 01/02/21 1048 01/02/21 1119        Time Spent in minutes  30   Lala Lund M.D on 01/03/2021 at 9:23 AM  To page go to [email or web address]   [facility] -  Office  [phone]  See all Orders from today for further details    Objective:   Vitals:   01/02/21 1826 01/02/21 2119 01/03/21 0629 01/03/21 0841  BP: 109/61 107/62 101/66 110/69  Pulse: 83 73 67 85  Resp: 19  20 20   Temp: 97.7 F (36.5 C) 97.8 F (36.6 C) 97.6 F (36.4 C) 97.8 F (36.6 C)  TempSrc: Oral Oral Oral Oral  SpO2: 99% 100% 98% 97%  Weight:   78.1 kg   Height:        Wt Readings from Last 3 Encounters:  01/03/21 78.1 kg  12/27/20 76.9 kg  11/17/20 77.5 kg     Intake/Output Summary (Last 24 hours) at 01/03/2021 0923 Last data filed at 01/03/2021 0630 Gross per 24 hour  Intake --  Output 1000 ml  Net -1000 ml     Physical Exam  Awake Alert, No new F.N deficits, Normal affect [place].AT,PERRAL Supple Neck, No JVD,   Symmetrical  Chest wall movement, Good air movement bilaterally, Coarse lower B sounds RRR,No Gallops, Rubs or new Murmurs,  +ve B.Sounds, Abd Soft, No tenderness,   No Cyanosis, Clubbing or edema,       RN pressure injury documentation: Pressure Injury 12/26/20 Arm Anterior;Left;Proximal;Upper skin tear (Active)  12/26/20 0106  Location: Arm  Location Orientation: Anterior;Left;Proximal;Upper  Staging:   Wound Description (Comments): skin tear  Present on Admission: Yes     Data Review:    CBC Recent Labs  Lab 01/01/21 1439 01/02/21 0439 01/03/21 0403  WBC 13.6* 15.1* 14.9*  HGB 9.1* 9.0* 9.4*  HCT 30.9* 32.1* 32.2*  PLT 220 221 230  MCV 86.1 87.9 85.9  MCH 25.3* 24.7* 25.1*  MCHC 29.4* 28.0* 29.2*  RDW 16.4* 16.4* 16.5*  LYMPHSABS 1.0  --  1.0  MONOABS 0.9  --  1.1*  EOSABS 0.0  --  0.0  BASOSABS 0.0  --  0.0    Recent Labs  Lab 01/01/21 1439 01/01/21 1640 01/01/21 2046 01/02/21 0439 01/02/21 0808 01/03/21 0403  NA 138  --   --  138  --  139  K 4.2  --   --  3.8  --  4.2  CL 102  --   --  103  --  104  CO2 25  --   --  25  --  26  GLUCOSE 253*  --   --  215*  --  94  BUN 70*  --   --  67*  --  64*  CREATININE 2.64*  --   --  2.42*  --  2.23*  CALCIUM 8.6*  --   --  8.5*  --  8.5*  AST  --   --   --   --   --  14*  ALT  --   --   --   --   --  24  ALKPHOS  --   --   --   --   --  79  BILITOT  --   --   --   --   --  0.8  ALBUMIN  --   --   --   --   --  2.4*  MG  --   --  2.2  --  2.3 2.0  CRP  --   --   --   --  6.7* 9.8*  PROCALCITON  --   --   --   --  0.51 0.42  LATICACIDVEN 1.8 1.6  --   --   --   --   BNP 2,652.0*  --   --   --   --  2,227.4*    ------------------------------------------------------------------------------------------------------------------ No results for input(s): CHOL, HDL, LDLCALC, TRIG, CHOLHDL, LDLDIRECT in the last 72 hours.  Lab Results  Component Value Date   HGBA1C 8.5 (H) 12/26/2020    ------------------------------------------------------------------------------------------------------------------ No results for input(s): TSH, T4TOTAL, T3FREE, THYROIDAB in the last 72 hours.  Invalid input(s): FREET3  Cardiac Enzymes No results for input(s): CKMB, TROPONINI, MYOGLOBIN in the last 168 hours.  Invalid input(s): CK ------------------------------------------------------------------------------------------------------------------    Component Value Date/Time   BNP 2,227.4 (H) 01/03/2021 0403     Radiology Reports CT Head Wo Contrast  Result Date: 01/01/2021 CLINICAL DATA:  Head trauma, mod-severe fall, hematoma to  crown of head EXAM: CT HEAD WITHOUT CONTRAST TECHNIQUE: Contiguous axial images were obtained from the base of the skull through the vertex without intravenous contrast. COMPARISON:  12/26/2020 FINDINGS: Brain: There is atrophy and chronic small vessel disease changes. No acute intracranial abnormality. Specifically, no hemorrhage, hydrocephalus, mass lesion, acute infarction, or significant intracranial injury. Vascular: No hyperdense vessel or unexpected calcification. Skull: No acute calvarial abnormality. Sinuses/Orbits: No acute findings Other: None IMPRESSION: Atrophy, chronic microvascular disease. No acute intracranial abnormality. Electronically Signed   By: Rolm Baptise M.D.   On: 01/01/2021 16:43     DG Chest Port 1 View  Result Date: 01/02/2021 CLINICAL DATA:  Reason for exam: sob Covid + Patient reports sob without any chest pains. Hx of afib, chf, diabetes, htn, prior MI 2005. Hx of defibrillator, cabg x3 2005. EXAM: PORTABLE CHEST - 1 VIEW COMPARISON:  01/01/2021 FINDINGS: Left subclavian AICD stable. Relatively low lung volumes with some crowding of perihilar and bibasilar bronchovascular structures. No confluent airspace disease. Stable borderline cardiomegaly. CABG markers. Aortic Atherosclerosis (ICD10-170.0). No effusion.  No pneumothorax.  Sternotomy wires. Right shoulder DJD. Cholecystectomy clips. vertebral endplate spurring at multiple levels in the lower thoracic spine. IMPRESSION: Low volumes.  Stable chronic and postop changes as above. Electronically Signed   By: Lucrezia Europe M.D.   On: 01/02/2021 07:33   DG Chest Portable 1 View  Result Date: 01/01/2021 CLINICAL DATA:  Shortness of breath EXAM: PORTABLE CHEST 1 VIEW COMPARISON:  12/27/2020 FINDINGS: Left AICD remains in place, unchanged. Prior CABG. Cardiomegaly, vascular congestion. No overt edema. No confluent opacities or effusions. IMPRESSION: Cardiomegaly, vascular congestion. Electronically Signed   By: Rolm Baptise M.D.   On: 01/01/2021 15:46

## 2021-01-03 NOTE — Evaluation (Signed)
Occupational Therapy Evaluation Patient Details Name: [name] MRN: [id number] DOB: [birth year] Today's Date: 01/03/2021   History of Present Illness 87 y.o. [name] was admitted on 10/16 for a fall at home with pt hitting his head but cleared for fractures.  Had recent admission for positive covid test, still testing positive and SOB.  Has PVC's on telemetry, CHF exacerbation, LE edema.  PMHx:  CAD s/p CABG, CKD stage IIIb, DM2, ischemic cardiomyopathy with systolic and diastolic congestive heart failure, moderate aortic stenosis, paroxysmal atrial fibrillation, HTN, gout.   Clinical Impression   PTA, pt lives with spouse and reports Modified Independence with ADLs, household IADLs and mobility using RW. Pt's daughter assists with community IADLs. Pt presents now with minor deficits in cardiopulmonary tolerance, strength, and standing balance. Pt overall min guard progressing to supervision for mobility in room using RW. Pt Setup for UB ADLs and min guard for LB ADLs. Pt denies SOB during activity but noted with muscle fatigue during tasks. Anticipate quick return to PLOF with HHOT follow up to further address ADLs/IADLs in home setting.   SpO2 96% on RA. HR 83. BP WFL.     Recommendations for follow up therapy are one component of a multi-disciplinary discharge planning process, led by the attending physician.  Recommendations may be updated based on patient status, additional functional criteria and insurance authorization.   Follow Up Recommendations  Home health OT;Supervision - Intermittent    Equipment Recommendations  None recommended by OT    Recommendations for Other Services       Precautions / Restrictions Precautions Precautions: Fall Precaution Comments: monitor O2 and HR Restrictions Weight Bearing Restrictions: No      Mobility Bed Mobility Overal bed mobility: Needs Assistance;Modified Independent Bed Mobility: Supine to Sit     Supine to sit: Modified  independent (Device/Increase time)     General bed mobility comments: use of bed rails, HOB elevated    Transfers Overall transfer level: Needs assistance Equipment used: Rolling walker (2 wheeled) Transfers: Sit to/from Stand Sit to Stand: Min guard         General transfer comment: min guard for safety,no overt LOB or safety concerns    Balance Overall balance assessment: Needs assistance;History of Falls Sitting-balance support: Feet supported;No upper extremity supported Sitting balance-Leahy Scale: Good     Standing balance support: Bilateral upper extremity supported;During functional activity Standing balance-Leahy Scale: Fair Standing balance comment: fair static standing at sink, reliant on BUE support for mobility                           ADL either performed or assessed with clinical judgement   ADL Overall ADL's : Needs assistance/impaired Eating/Feeding: Independent;Sitting   Grooming: Set up;Standing;Wash/dry face;Oral care;Brushing hair Grooming Details (indicate cue type and reason): no LOB standing at sink, noted with progressive trunk flexion with prolonged standing at sink Upper Body Bathing: Set up;Sitting   Lower Body Bathing: Min guard;Sit to/from stand   Upper Body Dressing : Set up;Sitting   Lower Body Dressing: Min guard;Sit to/from stand Lower Body Dressing Details (indicate cue type and reason): able to pull up B socks via figure four position Toilet Transfer: Min guard;Ambulation   Toileting- Clothing Manipulation and Hygiene: Min guard;Sit to/from stand       Functional mobility during ADLs: Min guard;Rolling walker General ADL Comments: Pt denies SOB with activity, SpO2 WFL on RA trial. Is noted with progressive kyphotic posture with prolonged  activity, reports a fall 2 weeks ago but unsure of cause     Vision Baseline Vision/History: 1 Wears glasses Ability to See in Adequate Light: 0 Adequate Patient Visual Report: No  change from baseline Vision Assessment?: No apparent visual deficits     Perception     Praxis      Pertinent Vitals/Pain Pain Assessment: No/denies pain     Hand Dominance Right   Extremity/Trunk Assessment Upper Extremity Assessment Upper Extremity Assessment: Generalized weakness;RUE deficits/detail RUE Deficits / Details: impaired R shoulder flexion   Lower Extremity Assessment Lower Extremity Assessment: Defer to PT evaluation   Cervical / Trunk Assessment Cervical / Trunk Assessment: Kyphotic   Communication Communication Communication: No difficulties   Cognition Arousal/Alertness: Awake/alert Behavior During Therapy: WFL for tasks assessed/performed Overall Cognitive Status: Within Functional Limits for tasks assessed                                     General Comments       Exercises     Shoulder Instructions      Home Living Family/patient expects to be discharged to:: Private residence Living Arrangements: Spouse/significant other Available Help at Discharge: Family Type of Home: House Home Access: Ramped entrance     Home Layout: One level     Bathroom Shower/Tub: Teacher, early years/pre: Standard     Home Equipment: Environmental consultant - 2 wheels;Walker - 4 wheels;Cane - single point;Wheelchair - Brewing technologist - built in          Prior Functioning/Environment Level of Independence: Independent with assistive device(s)        Comments: Use of RW for mobility, able to complete ADLs and shares household IADls with spouse. Daughter assists with grocery shopping and transportation to MD appts        OT Problem List: Decreased strength;Decreased activity tolerance;Impaired balance (sitting and/or standing);Decreased range of motion;Cardiopulmonary status limiting activity      OT Treatment/Interventions:      OT Goals(Current goals can be found in the care plan section) Acute Rehab OT Goals Patient Stated Goal: feel  better OT Goal Formulation: With patient Time For Goal Achievement: 01/17/21 Potential to Achieve Goals: Good  OT Frequency: Min 2X/week   Barriers to D/C:            Co-evaluation              AM-PAC OT "6 Clicks" Daily Activity     Outcome Measure Help from another person eating meals?: None Help from another person taking care of personal grooming?: A Little Help from another person toileting, which includes using toliet, bedpan, or urinal?: A Little Help from another person bathing (including washing, rinsing, drying)?: A Little Help from another person to put on and taking off regular upper body clothing?: A Little Help from another person to put on and taking off regular lower body clothing?: A Little 6 Click Score: 19   End of Session Equipment Utilized During Treatment: Rolling walker;Oxygen Nurse Communication: Mobility status  Activity Tolerance: Patient tolerated treatment well Patient left: in chair;with call bell/phone within reach;with chair alarm set  OT Visit Diagnosis: Unsteadiness on feet (R26.81);Other abnormalities of gait and mobility (R26.89);History of falling (Z91.81);Muscle weakness (generalized) (M62.81)                Time: 6967-8938 OT Time Calculation (min): 29 min Charges:  OT General Charges $OT  Visit: 1 Visit OT Evaluation $OT Eval Moderate Complexity: 1 Mod OT Treatments $Self Care/Home Management : 8-22 mins  Malachy Chamber, OTR/L Acute Rehab Services Office: [phone]   Layla Maw 01/03/2021, 8:50 AM

## 2021-01-03 NOTE — Telephone Encounter (Signed)
Transition Care Management Unsuccessful Follow-up Telephone Call  Date of discharge and from where:  12/27/2020  [facility]  Attempts:  2nd Attempt  Reason for unsuccessful TCM follow-up call:  Unable to reach patient

## 2021-01-04 ENCOUNTER — Inpatient Hospital Stay: Payer: Self-pay

## 2021-01-04 ENCOUNTER — Inpatient Hospital Stay (HOSPITAL_COMMUNITY): Payer: No Typology Code available for payment source

## 2021-01-04 DIAGNOSIS — I2510 Atherosclerotic heart disease of native coronary artery without angina pectoris: Secondary | ICD-10-CM | POA: Diagnosis not present

## 2021-01-04 DIAGNOSIS — I5023 Acute on chronic systolic (congestive) heart failure: Secondary | ICD-10-CM | POA: Diagnosis not present

## 2021-01-04 DIAGNOSIS — I480 Paroxysmal atrial fibrillation: Secondary | ICD-10-CM

## 2021-01-04 DIAGNOSIS — N179 Acute kidney failure, unspecified: Secondary | ICD-10-CM | POA: Diagnosis not present

## 2021-01-04 DIAGNOSIS — N1832 Chronic kidney disease, stage 3b: Secondary | ICD-10-CM

## 2021-01-04 LAB — CBC WITH DIFFERENTIAL/PLATELET
Abs Immature Granulocytes: 0.09 10*3/uL — ABNORMAL HIGH (ref 0.00–0.07)
Basophils Absolute: 0 10*3/uL (ref 0.0–0.1)
Basophils Relative: 0 %
Eosinophils Absolute: 0 10*3/uL (ref 0.0–0.5)
Eosinophils Relative: 0 %
HCT: 33.3 % — ABNORMAL LOW (ref 39.0–52.0)
Hemoglobin: 9.9 g/dL — ABNORMAL LOW (ref 13.0–17.0)
Immature Granulocytes: 1 %
Lymphocytes Relative: 8 %
Lymphs Abs: 1.1 10*3/uL (ref 0.7–4.0)
MCH: 25.1 pg — ABNORMAL LOW (ref 26.0–34.0)
MCHC: 29.7 g/dL — ABNORMAL LOW (ref 30.0–36.0)
MCV: 84.3 fL (ref 80.0–100.0)
Monocytes Absolute: 0.8 10*3/uL (ref 0.1–1.0)
Monocytes Relative: 6 %
Neutro Abs: 11 10*3/uL — ABNORMAL HIGH (ref 1.7–7.7)
Neutrophils Relative %: 85 %
Platelets: 247 10*3/uL (ref 150–400)
RBC: 3.95 MIL/uL — ABNORMAL LOW (ref 4.22–5.81)
RDW: 16.5 % — ABNORMAL HIGH (ref 11.5–15.5)
WBC: 12.9 10*3/uL — ABNORMAL HIGH (ref 4.0–10.5)
nRBC: 0 % (ref 0.0–0.2)

## 2021-01-04 LAB — GLUCOSE, CAPILLARY
Glucose-Capillary: 181 mg/dL — ABNORMAL HIGH (ref 70–99)
Glucose-Capillary: 165 mg/dL — ABNORMAL HIGH (ref 70–99)
Glucose-Capillary: 191 mg/dL — ABNORMAL HIGH (ref 70–99)
Glucose-Capillary: 288 mg/dL — ABNORMAL HIGH (ref 70–99)

## 2021-01-04 LAB — COOXEMETRY PANEL
Carboxyhemoglobin: 0.2 % — ABNORMAL LOW (ref 0.5–1.5)
Carboxyhemoglobin: 1.3 % (ref 0.5–1.5)
Methemoglobin: 0.3 % (ref 0.0–1.5)
Methemoglobin: 1 % (ref 0.0–1.5)
O2 Saturation: 43.1 %
O2 Saturation: 52 %
Total hemoglobin: 9.6 g/dL — ABNORMAL LOW (ref 12.0–16.0)
Total hemoglobin: 9.3 g/dL — ABNORMAL LOW (ref 12.0–16.0)

## 2021-01-04 LAB — COMPREHENSIVE METABOLIC PANEL
ALT: 22 U/L (ref 0–44)
AST: 15 U/L (ref 15–41)
Albumin: 2.5 g/dL — ABNORMAL LOW (ref 3.5–5.0)
Alkaline Phosphatase: 91 U/L (ref 38–126)
Anion gap: 8 (ref 5–15)
BUN: 66 mg/dL — ABNORMAL HIGH (ref 8–23)
CO2: 26 mmol/L (ref 22–32)
Calcium: 8.2 mg/dL — ABNORMAL LOW (ref 8.9–10.3)
Chloride: 96 mmol/L — ABNORMAL LOW (ref 98–111)
Creatinine, Ser: 2.45 mg/dL — ABNORMAL HIGH (ref 0.61–1.24)
GFR, Estimated: 24 mL/min — ABNORMAL LOW (ref >60)
Glucose, Bld: 198 mg/dL — ABNORMAL HIGH (ref 70–99)
Potassium: 3.9 mmol/L (ref 3.5–5.1)
Sodium: 130 mmol/L — ABNORMAL LOW (ref 135–145)
Total Bilirubin: 0.5 mg/dL (ref 0.3–1.2)
Total Protein: 5.4 g/dL — ABNORMAL LOW (ref 6.5–8.1)

## 2021-01-04 LAB — BRAIN NATRIURETIC PEPTIDE: B Natriuretic Peptide: 2990.9 pg/mL — ABNORMAL HIGH (ref 0.0–100.0)

## 2021-01-04 LAB — MAGNESIUM: Magnesium: 2 mg/dL (ref 1.7–2.4)

## 2021-01-04 LAB — PROCALCITONIN: Procalcitonin: 0.37 ng/mL

## 2021-01-04 LAB — C-REACTIVE PROTEIN: CRP: 8.7 mg/dL — ABNORMAL HIGH (ref <1.0)

## 2021-01-04 MED ORDER — MILRINONE LACTATE IN DEXTROSE 20-5 MG/100ML-% IV SOLN
0.2500 ug/kg/min | INTRAVENOUS | Status: DC
  Administered 2021-01-04: 0.25 ug/kg/min via INTRAVENOUS
  Filled 2021-01-04: qty 100

## 2021-01-04 MED ORDER — AMIODARONE HCL IN DEXTROSE 360-4.14 MG/200ML-% IV SOLN
30.0000 mg/h | INTRAVENOUS | Status: DC
  Administered 2021-01-04 – 2021-01-09 (×9): 30 mg/h via INTRAVENOUS
  Filled 2021-01-04 (×10): qty 200

## 2021-01-04 MED ORDER — CHLORHEXIDINE GLUCONATE CLOTH 2 % EX PADS
6.0000 | MEDICATED_PAD | Freq: Every day | CUTANEOUS | Status: DC
  Administered 2021-01-04 – 2021-01-12 (×9): 6 via TOPICAL

## 2021-01-04 MED ORDER — SODIUM CHLORIDE 0.9% FLUSH: 10.0000 mL | INTRAVENOUS | Status: DC | PRN

## 2021-01-04 MED ORDER — SODIUM CHLORIDE 0.9% FLUSH
10.0000 mL | Freq: Two times a day (BID) | INTRAVENOUS | Status: DC
Start: 2021-01-04 — End: 2021-01-14
  Administered 2021-01-04 – 2021-01-11 (×10): 10 mL
  Administered 2021-01-11: 20 mL
  Administered 2021-01-12: 10 mL

## 2021-01-04 MED ORDER — MILRINONE LACTATE IN DEXTROSE 20-5 MG/100ML-% IV SOLN
0.1250 ug/kg/min | INTRAVENOUS | Status: DC
  Administered 2021-01-05 (×3): 0.375 ug/kg/min via INTRAVENOUS
  Filled 2021-01-04 (×4): qty 100

## 2021-01-04 MED ORDER — FUROSEMIDE 10 MG/ML IJ SOLN
80.0000 mg | Freq: Two times a day (BID) | INTRAMUSCULAR | Status: DC
  Administered 2021-01-04 (×2): 80 mg via INTRAVENOUS
  Filled 2021-01-04 (×2): qty 8

## 2021-01-04 MED ORDER — POTASSIUM CHLORIDE CRYS ER 20 MEQ PO TBCR
40.0000 meq | EXTENDED_RELEASE_TABLET | Freq: Once | ORAL | Status: AC
  Administered 2021-01-04: 40 meq via ORAL
  Filled 2021-01-04: qty 2

## 2021-01-04 NOTE — Progress Notes (Signed)
SLP Cancellation Note  Patient Details Name: [name] MRN: [id number] DOB: 02-02-1929   Cancelled treatment:       Reason Eval/Treat Not Completed: Patient at procedure or test/unavailable. Unable to complete BSE at this time, as pt is with nursing for pt care. Will continue efforts.   Avien Taha B. Quentin Ore, [facility], [place] Speech Language Pathologist Office: [phone]  Shonna Chock 01/04/2021, 12:08 PM

## 2021-01-04 NOTE — Telephone Encounter (Signed)
Transition Care Management Unsuccessful Follow-up Telephone Call  Date of discharge and from where:  [facility] 12/27/2020  Attempts:  3rd Attempt  Reason for unsuccessful TCM follow-up call:  Unable to reach patient

## 2021-01-04 NOTE — Progress Notes (Signed)
Patient ID: [name], male   DOB: 11/27/1928, 87 y.o.   MRN: [id number] P    Advanced Heart Failure Rounding Note  PCP-Cardiologist: None   Subjective:    Still feels short of breath.  Remains in atrial fibrillation with controlled rate.  Weight down 1 lb, does not appear to have had a vigorous diuresis based on I/Os.    Objective:   Weight Range: 77.8 kg Body mass index is 22.02 kg/m.   Vital Signs:   Temp:  [97.5 F (36.4 C)-98.6 F (37 C)] 97.7 F (36.5 C) (10/19 7341) Pulse Rate:  [71-89] 85 (10/19 0632) Resp:  [18-20] 20 (10/19 9379) BP: (107-121)/(53-81) 114/76 (10/19 0632) SpO2:  [93 %-98 %] 97 % (10/19 0240) Weight:  [77.8 kg] 77.8 kg (10/19 0632) Last BM Date: 12/31/20  Weight change: Filed Weights   01/02/21 1436 01/03/21 0629 01/04/21 9735  Weight: 78.1 kg 78.1 kg 77.8 kg    Intake/Output:   Intake/Output Summary (Last 24 hours) at 01/04/2021 0816 Last data filed at 01/04/2021 3299 Gross per 24 hour  Intake 300 ml  Output 1050 ml  Net -750 ml      Physical Exam    General: NAD Neck: JVP 14-16 cm, no thyromegaly or thyroid nodule.  Lungs: Clear to auscultation bilaterally with normal respiratory effort. CV: Nondisplaced PMI.  Heart irregular S1/S2, no S3/S4, 2/6 SEM RUSB.  1+ ankle edema.   Abdomen: Soft, nontender, no hepatosplenomegaly, no distention.  Skin: Intact without lesions or rashes.  Neurologic: Alert and oriented x 3.  Psych: Normal affect. Extremities: No clubbing or cyanosis.  HEENT: Normal.   Telemetry   Atrial fibrillation 80s (personally reviewed)  Labs    CBC Recent Labs    01/03/21 0403 01/04/21 0411  WBC 14.9* 12.9*  NEUTROABS 12.5* 11.0*  HGB 9.4* 9.9*  HCT 32.2* 33.3*  MCV 85.9 84.3  PLT 230 242   Basic Metabolic Panel Recent Labs    01/03/21 0403 01/04/21 0411  NA 139 130*  K 4.2 3.9  CL 104 96*  CO2 26 26  GLUCOSE 94 198*  BUN 64* 66*  CREATININE 2.23* 2.45*  CALCIUM 8.5* 8.2*  MG 2.0 2.0    Liver Function Tests Recent Labs    01/03/21 0403 01/04/21 0411  AST 14* 15  ALT 24 22  ALKPHOS 79 91  BILITOT 0.8 0.5  PROT 5.2* 5.4*  ALBUMIN 2.4* 2.5*   No results for input(s): LIPASE, AMYLASE in the last 72 hours. Cardiac Enzymes No results for input(s): CKTOTAL, CKMB, CKMBINDEX, TROPONINI in the last 72 hours.  BNP: BNP (last 3 results) Recent Labs    01/01/21 1439 01/03/21 0403 01/04/21 0411  BNP 2,652.0* 2,227.4* 2,990.9*    ProBNP (last 3 results) No results for input(s): PROBNP in the last 8760 hours.   D-Dimer No results for input(s): DDIMER in the last 72 hours. Hemoglobin A1C No results for input(s): HGBA1C in the last 72 hours. Fasting Lipid Panel No results for input(s): CHOL, HDL, LDLCALC, TRIG, CHOLHDL, LDLDIRECT in the last 72 hours. Thyroid Function Tests No results for input(s): TSH, T4TOTAL, T3FREE, THYROIDAB in the last 72 hours.  Invalid input(s): FREET3  Other results:   Imaging    [place] EKG SITE RITE  Result Date: 01/04/2021 If Site Rite image not attached, placement could not be confirmed due to current cardiac rhythm.    Medications:     Scheduled Medications:  allopurinol  100 mg Oral Daily   apixaban  2.5 mg Oral BID   atorvastatin  40 mg Oral Daily   carvedilol  3.125 mg Oral BID WC   dapagliflozin propanediol  10 mg Oral QAC breakfast   digoxin  0.0625 mg Oral Q M,W,F   doxycycline  100 mg Oral Q12H   escitalopram  10 mg Oral Daily   furosemide  80 mg Intravenous BID   gabapentin  100 mg Oral BID   guaiFENesin  600 mg Oral BID   hydrALAZINE  12.5 mg Oral Q8H   insulin aspart  0-9 Units Subcutaneous TID WC   insulin glargine-yfgn  30 Units Subcutaneous QHS   isosorbide mononitrate  15 mg Oral Daily   loratadine  10 mg Oral Daily   potassium chloride  40 mEq Oral Once   pramipexole  0.25 mg Oral Q24H   sodium chloride flush  3 mL Intravenous Q12H   spironolactone  25 mg Oral Daily   tamsulosin  0.4 mg Oral QPC  breakfast   vitamin B-12  1,000 mcg Oral Daily    Infusions:  sodium chloride     amiodarone     milrinone      PRN Medications: sodium chloride, acetaminophen **OR** acetaminophen, lidocaine, sodium chloride flush    Assessment/Plan   1. Acute on chronic systolic CHF: Ischemic cardiomyopathy.  Echo in 5/22 with EF 20-25%, low gradient moderate AS with mean gradient 12 mmHg and AVA 1.1. [employer] Jude CRT-D device.  CHF exacerbation in setting of recurrent atrial fibrillation and loss of effective CRT.  On exam, he is still volume overloaded and does not appear to have diuresed much yesterday.  Still short of breath.  Creatinine higher today 2.2 => 2.45.  - I am going to place PICC and follow CVP and co-ox to help to optimize his diuresis in setting of CKD (not HD candidate with age, cardiac dysfunction).   - Start milrinone 0.25 mcg/kg/min.  - Lasix 80 mg IV bid today.  - Can continue low dose digoxin for now given normal level, may need to stop if any further creatinine rise.   - Continue Farxiga 10 mg daily.  - Continue spironolactone 25 daily.  - Continue Coreg 3.125 mg bid.  - Hydralazine 12.5 tid + Imdur 15.   - Needs to regain NSR, see below.  2. Atrial fibrillation: Paroxysmal.  Went into AF on 10/9, likely triggered by COVID-19 infection.  Loss of effective CRT with AF.   - Amiodarone gtt while on milrinone.   - On Eliquis 2.5 bid and has not missed doses.  - Aim for DCCV when he is better diuresed.  Will hold off until probably Friday (he can eat today).  3. CKD stage 4: Baseline creatinine around 2.  Creatinine 2.45 today.  - Follow closely with diuresis.  4. COVID-19: Admitted initially 10/8 with COVID-19. Received monoclonal ab and remdesivir initially. Still with cough though CHF likely contributes to this.  - Can stop precautions today.  5. CAD: s/p CABG.  No chest pain.  HS-TnI minimally elevated with no trend, likely demand ischemia.  - Continue atorvastatin. - Not on  ASA with stable CAD and apixaban use.    Length of Stay: 3  [name], MD  01/04/2021, 8:16 AM  Advanced Heart Failure Team Pager [phone] (M-F; 7a - 5p)  Please contact [facility] for night-coverage after hours (5p -7a ) and weekends on [email or web address]

## 2021-01-04 NOTE — Progress Notes (Signed)
[facility]                                                                              Patient Demographics  [name], is a 87 y.o. male, DOB - 12/22/1928, IRJ:[id number]  Admit date - 01/01/2021   Admitting Physician Orma Flaming, MD  Outpatient Primary MD for the patient is Panosh, Standley Brooking, MD  Outpatient specialists:   LOS - 3  days   Medical records reviewed and are as summarized below:    Chief Complaint  Patient presents with   Shortness of Breath   Dizziness   Leg Swelling       Brief summary   Patient is a 87 y.o. 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  Recently discharged on 12/27/2020 for CHF exacerbation and has continued to become progressively more short of breath since this time, he developed shortness of breath along with some dry cough, work-up suggestive of bronchitis along with underlying CHF.  Patient was admitted for further work-up.   Assessment & Plan    Principal Problem: Acute respiratory failure with hypoxia due to bacterial bronchitis  Acute on chronic systolic CHF (congestive heart failure) ([place]) -Patient was placed on Zithromax, Mucinex, flutter valve, supportive care -Still feels somewhat short of breath, has volume overload -CHF team following, continue IV Lasix 80 mg every 12 hours, starting milrinone for inotropic support -Continue Aldactone, Coreg, hydralazine, Imdur, Farxiga -Continue strict I's and O's and daily weights, negative balance of 2.9 L   Active Problems: Paroxysmal atrial fibrillation -Continue amiodarone drip, while on milrinone -d/w Dr [name], holding off on the DCCV today until better diuresed -Continue eliquis  Recent COVID-19 infection -Patient had tested positive on 12/24/2020 and had  received monoclonal antibody infusion during the last hospitalization -Stable, off isolation today  Acute kidney injury superimposed on stage IIIb CKD -Baseline creatinine 2.15 -Worsened today 2.45, likely due to diuresis -No ACE/ARB  Essential hypertension -BP stable, continue Coreg, Imdur, Lasix, Aldactone  Obstructive sleep apnea -Continue CPAP nightly  Iron deficiency anemia Continue to monitor H&H  Restless leg syndrome -Continue Mirapex  Diabetes mellitus type 2, IDDM uncontrolled with hyperglycemia, CKD 3B -CBGs controlled -Continue SSI, basal insulin 30 units at bedtime  Pressure injury, left arm anterior upper skin tear -Present on admission, wound care per nursing Pressure Injury 12/26/20 Arm Anterior;Left;Proximal;Upper skin tear (Active)  12/26/20 0106  Location: Arm  Location Orientation: Anterior;Left;Proximal;Upper  Staging:   Wound Description (Comments): skin tear  Present on Admission: Yes     Code Status: DNR DVT Prophylaxis:  Place TED hose Start: 01/02/21 1518 apixaban (ELIQUIS) tablet 2.5 mg Start: 01/01/21 2200 apixaban (ELIQUIS) tablet 2.5 mg   Level of Care: Level of care: Telemetry Cardiac Family Communication: Discussed all imaging results, lab results, explained to the patient o   Disposition Plan:     Status is: Inpatient  Remains inpatient appropriate because: Still on diuresis, awaiting DCCV  Time Spent in minutes   35 minutes  Procedures:    Consultants:   Cardiology  Antimicrobials:   Anti-infectives (From admission, onward)    Start     Dose/Rate Route Frequency Ordered Stop   01/03/21 1000  azithromycin (ZITHROMAX) tablet 250 mg  Status:  Discontinued        250 mg Oral Daily 01/02/21 1049 01/02/21 1119   01/02/21 1130  doxycycline (VIBRA-TABS) tablet 100 mg        100 mg Oral Every 12 hours 01/02/21 1119 01/07/21 0959   01/02/21 1100  azithromycin (ZITHROMAX) tablet 500 mg  Status:  Discontinued        500 mg Oral   Once 01/02/21 1048 01/02/21 1119          Medications  Scheduled Meds:  allopurinol  100 mg Oral Daily   apixaban  2.5 mg Oral BID   atorvastatin  40 mg Oral Daily   carvedilol  3.125 mg Oral BID WC   Chlorhexidine Gluconate Cloth  6 each Topical Daily   dapagliflozin propanediol  10 mg Oral QAC breakfast   digoxin  0.0625 mg Oral Q M,W,F   doxycycline  100 mg Oral Q12H   escitalopram  10 mg Oral Daily   furosemide  80 mg Intravenous BID   gabapentin  100 mg Oral BID   guaiFENesin  600 mg Oral BID   hydrALAZINE  12.5 mg Oral Q8H   insulin aspart  0-9 Units Subcutaneous TID WC   insulin glargine-yfgn  30 Units Subcutaneous QHS   isosorbide mononitrate  15 mg Oral Daily   loratadine  10 mg Oral Daily   pramipexole  0.25 mg Oral Q24H   sodium chloride flush  10-40 mL Intracatheter Q12H   sodium chloride flush  3 mL Intravenous Q12H   spironolactone  25 mg Oral Daily   tamsulosin  0.4 mg Oral QPC breakfast   vitamin B-12  1,000 mcg Oral Daily   Continuous Infusions:  sodium chloride     amiodarone 30 mg/hr (01/04/21 1319)   milrinone 0.375 mcg/kg/min (01/04/21 1312)   PRN Meds:.sodium chloride, acetaminophen **OR** acetaminophen, lidocaine, sodium chloride flush, sodium chloride flush      Subjective:   [name] was seen and examined today.  Still have some shortness of breath, still in atrial fibrillation.  No acute chest pain, dizziness, palpitations. Patient denies abdominal pain, N/V/D/C, new weakness, numbess, tingling. No acute events overnight.    Objective:   Vitals:   01/03/21 2014 01/04/21 0500 01/04/21 0514 01/04/21 0632  BP: 116/73 (!) 121/53 (!) 121/53 114/76  Pulse: 76 72 83 85  Resp:  20  20  Temp: 97.6 F (36.4 C) (!) 97.5 F (36.4 C) (!) 97.5 F (36.4 C) 97.7 F (36.5 C)  TempSrc: Oral Oral Oral Oral  SpO2: 96% 98% 93% 97%  Weight:    77.8 kg  Height:        Intake/Output Summary (Last 24 hours) at 01/04/2021 1445 Last data filed  at 01/04/2021 1200 Gross per 24 hour  Intake 300 ml  Output 1350 ml  Net -1050 ml     Wt Readings from Last 3 Encounters:  01/04/21 77.8 kg  12/27/20 76.9 kg  11/17/20 77.5 kg     Exam General: Alert and oriented x 3, NAD Cardiovascular: Irregularly irregular, 2/6 SEM Respiratory: Clear to auscultation bilaterally, no wheezing, Gastrointestinal: Soft, nontender, nondistended, + bowel sounds Ext: 1+ pedal edema bilaterally Neuro: no new FND's  Data Reviewed:  I have personally reviewed following labs and imaging studies  Micro Results Recent Results (from the past 240 hour(s))  Resp Panel by RT-PCR (Flu A&B, Covid)     Status: Abnormal   Collection Time: 12/25/20  8:26 PM  Result Value Ref Range Status   SARS Coronavirus 2 by RT PCR POSITIVE (A) NEGATIVE Final    Comment: RESULT CALLED TO, READ BACK BY AND VERIFIED WITH:  MALLARD,A RN @2118  12/25/20 EDENSCA (NOTE) SARS-CoV-2 target nucleic acids are DETECTED.  The SARS-CoV-2 RNA is generally detectable in upper respiratory specimens during the acute phase of infection. Positive results are indicative of the presence of the identified virus, but do not rule out bacterial infection or co-infection with other pathogens not detected by the test. Clinical correlation with patient history and other diagnostic information is necessary to determine patient infection status. The expected result is Negative.  Fact Sheet for Patients: EntrepreneurPulse.com.au  Fact Sheet for Healthcare Providers: IncredibleEmployment.be  This test is not yet approved or cleared by the [place] FDA and  has been authorized for detection and/or diagnosis of SARS-CoV-2 by FDA under an Emergency Use Authorization (EUA).  This EUA will remain in effect (meaning this test can  be used) for the duration of  the COVID-19 declaration under Section 564(b)(1) of the Act, 21 U.S.C. section 360bbb-3(b)(1),  unless the authorization is terminated or revoked sooner.     Influenza A by PCR NEGATIVE NEGATIVE Final   Influenza B by PCR NEGATIVE NEGATIVE Final    Comment: (NOTE) The Xpert Xpress SARS-CoV-2/FLU/RSV plus assay is intended as an aid in the diagnosis of influenza from Nasopharyngeal swab specimens and should not be used as a sole basis for treatment. Nasal washings and aspirates are unacceptable for Xpert Xpress SARS-CoV-2/FLU/RSV testing.  Fact Sheet for Patients: EntrepreneurPulse.com.au  Fact Sheet for Healthcare Providers: IncredibleEmployment.be  This test is not yet approved or cleared by the [place] FDA and has been authorized for detection and/or diagnosis of SARS-CoV-2 by FDA under an Emergency Use Authorization (EUA). This EUA will remain in effect (meaning this test can be used) for the duration of the COVID-19 declaration under Section 564(b)(1) of the Act, 21 U.S.C. section 360bbb-3(b)(1), unless the authorization is terminated or revoked.  Performed at [facility], [address] [place], [place] [address]   Resp Panel by RT-PCR (Flu A&B, Covid) Nasopharyngeal Swab     Status: Abnormal   Collection Time: 01/01/21  2:34 PM   Specimen: Nasopharyngeal Swab; Nasopharyngeal(NP) swabs in vial transport medium  Result Value Ref Range Status   SARS Coronavirus 2 by RT PCR POSITIVE (A) NEGATIVE Final    Comment: RESULT CALLED TO, READ BACK BY AND VERIFIED WITH: RN J.COOK ON [id number] AT 1650 BY E.PARRISH (NOTE) SARS-CoV-2 target nucleic acids are DETECTED.  The SARS-CoV-2 RNA is generally detectable in upper respiratory specimens during the acute phase of infection. Positive results are indicative of the presence of the identified virus, but do not rule out bacterial infection or co-infection with other pathogens not detected by the test. Clinical correlation with patient history and other diagnostic  information is necessary to determine patient infection status. The expected result is Negative.  Fact Sheet for Patients: EntrepreneurPulse.com.au  Fact Sheet for Healthcare Providers: IncredibleEmployment.be  This test is not yet approved or cleared by the [place] FDA and  has been authorized for detection and/or diagnosis of SARS-CoV-2 by FDA under an Emergency Use Authorization (EUA).  This EUA will remain in effect (meaning this te st can be used) for the duration of  the COVID-19 declaration under Section 564(b)(1) of the Act, 21 U.S.C. section 360bbb-3(b)(1), unless the authorization is terminated or revoked sooner.     Influenza A by PCR NEGATIVE NEGATIVE Final   Influenza B by PCR NEGATIVE NEGATIVE Final    Comment: (NOTE) The Xpert Xpress SARS-CoV-2/FLU/RSV plus assay is intended as an aid in the diagnosis of influenza from Nasopharyngeal swab specimens and should not be used as a sole basis for treatment. Nasal washings and aspirates are unacceptable for Xpert Xpress SARS-CoV-2/FLU/RSV testing.  Fact Sheet for Patients: EntrepreneurPulse.com.au  Fact Sheet for Healthcare Providers: IncredibleEmployment.be  This test is not yet approved or cleared by the [place] FDA and has been authorized for detection and/or diagnosis of SARS-CoV-2 by FDA under an Emergency Use Authorization (EUA). This EUA will remain in effect (meaning this test can be used) for the duration of the COVID-19 declaration under Section 564(b)(1) of the Act, 21 U.S.C. section 360bbb-3(b)(1), unless the authorization is terminated or revoked.  Performed at [facility], [place] [address], [place], [place] [address]     Radiology Reports CT Head Wo Contrast  Result Date: 01/01/2021 CLINICAL DATA:  Head trauma, mod-severe fall, hematoma to crown of head EXAM: CT HEAD WITHOUT CONTRAST TECHNIQUE: Contiguous  axial images were obtained from the base of the skull through the vertex without intravenous contrast. COMPARISON:  12/26/2020 FINDINGS: Brain: There is atrophy and chronic small vessel disease changes. No acute intracranial abnormality. Specifically, no hemorrhage, hydrocephalus, mass lesion, acute infarction, or significant intracranial injury. Vascular: No hyperdense vessel or unexpected calcification. Skull: No acute calvarial abnormality. Sinuses/Orbits: No acute findings Other: None IMPRESSION: Atrophy, chronic microvascular disease. No acute intracranial abnormality. Electronically Signed   By: Rolm Baptise M.D.   On: 01/01/2021 16:43   CT HEAD WO CONTRAST (5MM)  Result Date: 12/26/2020 CLINICAL DATA:  Head and neck trauma EXAM: CT HEAD WITHOUT CONTRAST TECHNIQUE: Contiguous axial images were obtained from the base of the skull through the vertex without intravenous contrast. COMPARISON:  12/16/2009 FINDINGS: Brain: Progressive confluent hypodensities throughout the periventricular white matter consistent with chronic small vessel ischemic changes. No signs of acute infarct or hemorrhage. Lateral ventricles and midline structures are unremarkable. No acute extra-axial fluid collections. No mass effect. Vascular: Prominent atherosclerosis of the internal carotid arteries. No hyperdense vessel. Skull: Small right parietal scalp hematoma. No underlying fracture. Negative for fracture or focal lesion. Sinuses/Orbits: Mild mucosal thickening within the ethmoid and sphenoid sinuses. Other: None. IMPRESSION: 1. No acute intracranial process. 2. Small right parietal scalp hematoma. Electronically Signed   By: Randa Ngo M.D.   On: 12/26/2020 21:26   CT CERVICAL SPINE WO CONTRAST  Result Date: 12/26/2020 CLINICAL DATA:  Head and neck trauma EXAM: CT CERVICAL SPINE WITHOUT CONTRAST TECHNIQUE: Multidetector CT imaging of the cervical spine was performed without intravenous contrast. Multiplanar CT image  reconstructions were also generated. COMPARISON:  None. FINDINGS: Alignment: Alignment is grossly anatomic. Skull base and vertebrae: No acute fracture. No primary bone lesion or focal pathologic process. Soft tissues and spinal canal: No prevertebral fluid or swelling. No visible canal hematoma. 2.3 x 2.0 x 2.5 cm hypodense nodule arises from the lower pole of the left lobe thyroid. Disc levels: Mild diffuse facet hypertrophy. Disc spaces are well preserved. Upper chest: Central airway is patent.  Lung apices are clear. Other: Reconstructed images demonstrate no additional findings. IMPRESSION:  1. No acute cervical spine fracture. 2. Incidental 2.5 cm left lobe thyroid nodule. Recommend nonemergent thyroid US if clinically warranted given patient age. Electronically Signed   By: Randa Ngo M.D.   On: 12/26/2020 21:31   DG Chest Port 1 View  Result Date: 01/04/2021 CLINICAL DATA:  S/P PICC central line placement EXAM: PORTABLE CHEST - 1 VIEW COMPARISON:  01/03/2019 FINDINGS: Right arm PICC line to the cavoatrial junction. Stable left subclavian AICD. Coarse airspace opacities at the left lung base, obscuring the left diaphragmatic leaflet. Heart size upper limits normal. Aortic Atherosclerosis (ICD10-170.0). CABG markers. Blunting of left lateral costophrenic angle suggesting small effusion. No pneumothorax. Right shoulder DJD. IMPRESSION: Right arm PICC line to the cavoatrial junction. Coarse left lower lobe airspace opacities. Electronically Signed   By: Lucrezia Europe M.D.   On: 01/04/2021 12:18   DG Chest Port 1 View  Result Date: 01/02/2021 CLINICAL DATA:  Reason for exam: sob Covid + Patient reports sob without any chest pains. Hx of afib, chf, diabetes, htn, prior MI 2005. Hx of defibrillator, cabg x3 2005. EXAM: PORTABLE CHEST - 1 VIEW COMPARISON:  01/01/2021 FINDINGS: Left subclavian AICD stable. Relatively low lung volumes with some crowding of perihilar and bibasilar bronchovascular structures.  No confluent airspace disease. Stable borderline cardiomegaly. CABG markers. Aortic Atherosclerosis (ICD10-170.0). No effusion.  No pneumothorax. Sternotomy wires. Right shoulder DJD. Cholecystectomy clips. vertebral endplate spurring at multiple levels in the lower thoracic spine. IMPRESSION: Low volumes.  Stable chronic and postop changes as above. Electronically Signed   By: Lucrezia Europe M.D.   On: 01/02/2021 07:33   DG Chest Portable 1 View  Result Date: 01/01/2021 CLINICAL DATA:  Shortness of breath EXAM: PORTABLE CHEST 1 VIEW COMPARISON:  12/27/2020 FINDINGS: Left AICD remains in place, unchanged. Prior CABG. Cardiomegaly, vascular congestion. No overt edema. No confluent opacities or effusions. IMPRESSION: Cardiomegaly, vascular congestion. Electronically Signed   By: Rolm Baptise M.D.   On: 01/01/2021 15:46   DG CHEST PORT 1 VIEW  Result Date: 12/27/2020 CLINICAL DATA:  Shortness of breath, COVID EXAM: PORTABLE CHEST 1 VIEW COMPARISON:  12/25/2020 FINDINGS: Cardiomegaly with left chest multi lead pacer defibrillator. Prominent pulmonary vasculature without acute airspace opacity. Possible trace pleural effusions and or pleural thickening. IMPRESSION: Cardiomegaly and pulmonary vascular congestion. No acute airspace opacity. Possible trace pleural effusions and or pleural thickening. Electronically Signed   By: Delanna Ahmadi M.D.   On: 12/27/2020 10:51   DG Chest Portable 1 View  Result Date: 12/25/2020 CLINICAL DATA:  COVID positive. Shortness of breath. EXAM: PORTABLE CHEST 1 VIEW COMPARISON:  January 2020 FINDINGS: Cardiac pacemaker and postsurgical changes are stable. Enlarged cardiac silhouette. Calcific atherosclerotic disease and tortuosity of the aorta. Subtle patchy areas of airspace consolidation in the lung bases. Osseous structures are without acute abnormality. Soft tissues are grossly normal. IMPRESSION: Subtle patchy areas of airspace consolidation in the lung bases may represent  early atypical pneumonia. Electronically Signed   By: Fidela Salisbury M.D.   On: 12/25/2020 18:09   VAS [place] LOWER EXTREMITY VENOUS (DVT)  Result Date: 12/27/2020  Lower Venous DVT Study Patient Name:  COURY GRIEGER  Date of Exam:   12/27/2020 Medical Rec #: [id number]        Accession #:    [id number] Date of Birth: [birth year]        Patient Gender: M Patient Age:   35 years Exam Location:  [facility] Procedure:      VAS [place]  LOWER EXTREMITY VENOUS (DVT) Referring Phys: A POWELL JR --------------------------------------------------------------------------------  Indications: Left > right LE edema.  Risk Factors: COVID-19 +. Comparison Study: No prior study Performing Technologist: Maudry Mayhew MHA, RDMS, RVT, RDCS  Examination Guidelines: A complete evaluation includes B-mode imaging, spectral Doppler, color Doppler, and power Doppler as needed of all accessible portions of each vessel. Bilateral testing is considered an integral part of a complete examination. Limited examinations for reoccurring indications may be performed as noted. The reflux portion of the exam is performed with the patient in reverse Trendelenburg.  +-----+---------------+---------+-----------+----------+--------------+ RIGHTCompressibilityPhasicitySpontaneityPropertiesThrombus Aging +-----+---------------+---------+-----------+----------+--------------+ CFV  Full           Yes      Yes                                 +-----+---------------+---------+-----------+----------+--------------+   +---------+---------------+---------+-----------+----------+--------------+ LEFT     CompressibilityPhasicitySpontaneityPropertiesThrombus Aging +---------+---------------+---------+-----------+----------+--------------+ CFV      Full           Yes      Yes                                 +---------+---------------+---------+-----------+----------+--------------+ SFJ      Full                                                         +---------+---------------+---------+-----------+----------+--------------+ FV Prox  Full                                                        +---------+---------------+---------+-----------+----------+--------------+ FV Mid   Full                                                        +---------+---------------+---------+-----------+----------+--------------+ FV DistalFull                                                        +---------+---------------+---------+-----------+----------+--------------+ PFV      Full                                                        +---------+---------------+---------+-----------+----------+--------------+ POP      Full           Yes      Yes                                 +---------+---------------+---------+-----------+----------+--------------+ PTV      Full                                                        +---------+---------------+---------+-----------+----------+--------------+  PERO     Full                                                        +---------+---------------+---------+-----------+----------+--------------+ Gastroc  Full                    Yes                                 +---------+---------------+---------+-----------+----------+--------------+    Summary: RIGHT: - No evidence of common femoral vein obstruction.  LEFT: - There is no evidence of deep vein thrombosis in the lower extremity.  - No cystic structure found in the popliteal fossa.  *See table(s) above for measurements and observations. Electronically signed by Servando Snare MD on 12/27/2020 at 1:44:21 PM.    Final    [place] EKG SITE RITE  Result Date: 01/04/2021 If Site Rite image not attached, placement could not be confirmed due to current cardiac rhythm.   Lab Data:  CBC: Recent Labs  Lab 01/01/21 1439 01/02/21 0439 01/03/21 0403 01/04/21 0411  WBC 13.6* 15.1* 14.9* 12.9*  NEUTROABS  11.6*  --  12.5* 11.0*  HGB 9.1* 9.0* 9.4* 9.9*  HCT 30.9* 32.1* 32.2* 33.3*  MCV 86.1 87.9 85.9 84.3  PLT 220 221 230 426   Basic Metabolic Panel: Recent Labs  Lab 01/01/21 1439 01/01/21 2046 01/02/21 0439 01/02/21 0808 01/03/21 0403 01/04/21 0411  NA 138  --  138  --  139 130*  K 4.2  --  3.8  --  4.2 3.9  CL 102  --  103  --  104 96*  CO2 25  --  25  --  26 26  GLUCOSE 253*  --  215*  --  94 198*  BUN 70*  --  67*  --  64* 66*  CREATININE 2.64*  --  2.42*  --  2.23* 2.45*  CALCIUM 8.6*  --  8.5*  --  8.5* 8.2*  MG  --  2.2  --  2.3 2.0 2.0   GFR: Estimated Creatinine Clearance: 22.1 mL/min (A) (by C-G formula based on SCr of 2.45 mg/dL (H)). Liver Function Tests: Recent Labs  Lab 01/03/21 0403 01/04/21 0411  AST 14* 15  ALT 24 22  ALKPHOS 79 91  BILITOT 0.8 0.5  PROT 5.2* 5.4*  ALBUMIN 2.4* 2.5*   No results for input(s): LIPASE, AMYLASE in the last 168 hours. No results for input(s): AMMONIA in the last 168 hours. Coagulation Profile: No results for input(s): INR, PROTIME in the last 168 hours. Cardiac Enzymes: No results for input(s): CKTOTAL, CKMB, CKMBINDEX, TROPONINI in the last 168 hours. BNP (last 3 results) No results for input(s): PROBNP in the last 8760 hours. HbA1C: No results for input(s): HGBA1C in the last 72 hours. CBG: Recent Labs  Lab 01/03/21 1147 01/03/21 1618 01/03/21 2005 01/04/21 0628 01/04/21 1217  GLUCAP 187* 167* 173* 181* 165*   Lipid Profile: No results for input(s): CHOL, HDL, LDLCALC, TRIG, CHOLHDL, LDLDIRECT in the last 72 hours. Thyroid Function Tests: No results for input(s): TSH, T4TOTAL, FREET4, T3FREE, THYROIDAB in the last 72 hours. Anemia Panel: No results for input(s): VITAMINB12, FOLATE, FERRITIN, TIBC, IRON, RETICCTPCT in the last 72 hours. Urine analysis:  Component Value Date/Time   COLORURINE YELLOW 01/01/2021 2046   APPEARANCEUR CLEAR 01/01/2021 2046   LABSPEC 1.010 01/01/2021 2046   PHURINE 5.0  01/01/2021 2046   GLUCOSEU >=500 (A) 01/01/2021 2046   HGBUR NEGATIVE 01/01/2021 2046   HGBUR negative 07/14/2008 0852   BILIRUBINUR NEGATIVE 01/01/2021 2046   BILIRUBINUR n 08/15/2016 [place] 01/01/2021 2046   PROTEINUR NEGATIVE 01/01/2021 2046   UROBILINOGEN 0.2 08/15/2016 1322   UROBILINOGEN 0.2 04/29/2014 1006   NITRITE NEGATIVE 01/01/2021 2046   LEUKOCYTESUR NEGATIVE 01/01/2021 2046     Sally Reimers M.D. [facility] 01/04/2021, 2:45 PM  Available via Epic secure chat 7am-7pm After 7 pm, please refer to night coverage provider listed on amion.

## 2021-01-04 NOTE — Progress Notes (Signed)
Peripherally Inserted Central Catheter Placement  The IV Nurse has discussed with the patient and/or persons authorized to consent for the patient, the purpose of this procedure and the potential benefits and risks involved with this procedure.  The benefits include less needle sticks, lab draws from the catheter, and the patient may be discharged home with the catheter. Risks include, but not limited to, infection, bleeding, blood clot (thrombus formation), and puncture of an artery; nerve damage and irregular heartbeat and possibility to perform a PICC exchange if needed/ordered by physician.  Alternatives to this procedure were also discussed.  Bard Power PICC patient education guide, fact sheet on infection prevention and patient information card has been provided to patient /or left at bedside.    PICC Placement Documentation  PICC Double Lumen 01/04/21 PICC Right Brachial 42 cm 1 cm (Active)  Indication for Insertion or Continuance of Line Vasoactive infusions 01/04/21 1131  Exposed Catheter (cm) 1 cm 01/04/21 1131  Site Assessment Clean;Dry;Intact 01/04/21 1131  Lumen #1 Status Flushed;Saline locked;Blood return noted 01/04/21 1131  Lumen #2 Status Flushed;Saline locked;Blood return noted 01/04/21 1131  Dressing Type Transparent 01/04/21 1131  Dressing Status Clean;Dry;Intact 01/04/21 1131  Antimicrobial disc in place? Yes 01/04/21 1131  Dressing Intervention New dressing;Other (Comment) 01/04/21 1131  Dressing Change Due 01/11/21 01/04/21 1131       [name] 01/04/2021, 11:32 AM

## 2021-01-04 NOTE — Progress Notes (Signed)
PICC Line placed. Co-ox 43% on 0.25 of milrinone. CVP 15.   Increase milrinone to 0.375. Repeat Co-ox in 3 hrs.   Lyda Jester, PA-C

## 2021-01-04 NOTE — Progress Notes (Signed)
Physical Therapy Treatment Patient Details Name: [name] MRN: [id number] DOB: 05/18/1928 Today's Date: 01/04/2021   History of Present Illness 87 y.o. [name] was admitted on 10/16 for a fall at home with pt hitting his head but cleared for fractures.  Had recent admission for positive covid test, still testing positive and SOB.  Has PVC's on telemetry, CHF exacerbation, LE edema.  PMHx:  CAD s/p CABG, CKD stage IIIb, DM2, ischemic cardiomyopathy with systolic and diastolic congestive heart failure, moderate aortic stenosis, paroxysmal atrial fibrillation, HTN, gout.    PT Comments    Pt agreeable to bedroom mobility, politely declining hallway mobility this date. Pt with mostly steady gait with no LOB, but needs increased time in tight spaces. His balance deficits are also apparent through him only being able to minimally reach off his BOS without UE support standing at the sink. Performed standing lower extremity exercises with pt displaying particular weakness in his L leg (reports this is typical for him) and more so in his L gastrocs. Encouraged use of incentive spirometer and flutter valve. Will continue to follow acutely. Current recommendations remain appropriate.     Recommendations for follow up therapy are one component of a multi-disciplinary discharge planning process, led by the attending physician.  Recommendations may be updated based on patient status, additional functional criteria and insurance authorization.  Follow Up Recommendations  Home health PT     Equipment Recommendations  None recommended by PT    Recommendations for Other Services       Precautions / Restrictions Precautions Precautions: Fall Precaution Comments: monitor O2 and HR Restrictions Weight Bearing Restrictions: No     Mobility  Bed Mobility Overal bed mobility: Modified Independent Bed Mobility: Supine to Sit     Supine to sit: Modified independent (Device/Increase time)      General bed mobility comments: use of bed rails, HOB elevated, increased time    Transfers Overall transfer level: Needs assistance Equipment used: Rolling walker (2 wheeled) Transfers: Sit to/from Stand Sit to Stand: Min guard         General transfer comment: min guard for safety, no overt LOB or safety concerns  Ambulation/Gait Ambulation/Gait assistance: Supervision Gait Distance (Feet): 45 Feet Assistive device: Rolling walker (2 wheeled) Gait Pattern/deviations: Step-through pattern;Decreased stride length;Trunk flexed Gait velocity: decreased Gait velocity interpretation: <1.31 ft/sec, indicative of household ambulator General Gait Details: Pt deferred ambulating outside room, but agreed to doing lap inside room. Pt with slow, steady gait with kyphotic posture. Increased time with turns and managing RW in tight spaces. No LOB, supervision for safety and line management.   Stairs             Wheelchair Mobility    Modified Rankin (Stroke Patients Only)       Balance Overall balance assessment: Needs assistance;History of Falls Sitting-balance support: Feet supported;No upper extremity supported Sitting balance-Leahy Scale: Good     Standing balance support: Bilateral upper extremity supported;During functional activity;No upper extremity supported Standing balance-Leahy Scale: Fair Standing balance comment: Able to stand at sink without UE support brushing teeth, Bil UE support with mobility                            Cognition Arousal/Alertness: Awake/alert Behavior During Therapy: WFL for tasks assessed/performed Overall Cognitive Status: Within Functional Limits for tasks assessed  Exercises General Exercises - Lower Extremity Hip ABduction/ADduction: Strengthening;Both;10 reps;Standing (with RW) Hip Flexion/Marching: Strengthening;Both;10 reps;Standing (with RW) Heel Raises:  Strengthening;Both;5 reps;Standing (with RW, compensatory mechanisms noted, esp with L leg) Mini-Sqauts: Strengthening;Both;10 reps;Standing (with RW)    General Comments General comments (skin integrity, edema, etc.): RN clearing pt for session, reporting monitor alarming various arrhythmias incorrectly      Pertinent Vitals/Pain Pain Assessment: Faces Faces Pain Scale: No hurt Pain Intervention(s): Monitored during session;Patient requesting pain meds-RN notified    Home Living                      Prior Function            PT Goals (current goals can now be found in the care plan section) Acute Rehab PT Goals Patient Stated Goal: feel better PT Goal Formulation: With patient Time For Goal Achievement: 01/16/21 Potential to Achieve Goals: Good Progress towards PT goals: Progressing toward goals    Frequency    Min 3X/week      PT Plan Current plan remains appropriate    Co-evaluation              AM-PAC PT "6 Clicks" Mobility   Outcome Measure  Help needed turning from your back to your side while in a flat bed without using bedrails?: None Help needed moving from lying on your back to sitting on the side of a flat bed without using bedrails?: None Help needed moving to and from a bed to a chair (including a wheelchair)?: A Little Help needed standing up from a chair using your arms (e.g., wheelchair or bedside chair)?: A Little Help needed to walk in hospital room?: A Little Help needed climbing 3-5 steps with a railing? : A Little 6 Click Score: 20    End of Session Equipment Utilized During Treatment: Gait belt Activity Tolerance: Patient tolerated treatment well Patient left: in chair;with call bell/phone within reach;with chair alarm set Nurse Communication: Mobility status;Patient requests pain meds PT Visit Diagnosis: Muscle weakness (generalized) (M62.81);History of falling (Z91.81);Other (comment) (cardiac arrhythmias)     Time:  5625-6389 PT Time Calculation (min) (ACUTE ONLY): 23 min  Charges:  $Gait Training: 8-22 mins $Therapeutic Exercise: 8-22 mins                     Moishe Spice, PT, DPT Acute Rehabilitation Services  Pager: [phone] Office: [place] 01/04/2021, 5:10 PM

## 2021-01-05 ENCOUNTER — Encounter (HOSPITAL_COMMUNITY): Payer: No Typology Code available for payment source

## 2021-01-05 DIAGNOSIS — I480 Paroxysmal atrial fibrillation: Secondary | ICD-10-CM | POA: Diagnosis not present

## 2021-01-05 DIAGNOSIS — N179 Acute kidney failure, unspecified: Secondary | ICD-10-CM | POA: Diagnosis not present

## 2021-01-05 DIAGNOSIS — U071 COVID-19: Secondary | ICD-10-CM

## 2021-01-05 DIAGNOSIS — I5023 Acute on chronic systolic (congestive) heart failure: Secondary | ICD-10-CM | POA: Diagnosis not present

## 2021-01-05 DIAGNOSIS — I2510 Atherosclerotic heart disease of native coronary artery without angina pectoris: Secondary | ICD-10-CM | POA: Diagnosis not present

## 2021-01-05 LAB — CBC WITH DIFFERENTIAL/PLATELET
Abs Immature Granulocytes: 0.16 10*3/uL — ABNORMAL HIGH (ref 0.00–0.07)
Basophils Absolute: 0 10*3/uL (ref 0.0–0.1)
Basophils Relative: 0 %
Eosinophils Absolute: 0 10*3/uL (ref 0.0–0.5)
Eosinophils Relative: 0 %
HCT: 30.3 % — ABNORMAL LOW (ref 39.0–52.0)
Hemoglobin: 9 g/dL — ABNORMAL LOW (ref 13.0–17.0)
Immature Granulocytes: 2 %
Lymphocytes Relative: 9 %
Lymphs Abs: 0.9 10*3/uL (ref 0.7–4.0)
MCH: 24.9 pg — ABNORMAL LOW (ref 26.0–34.0)
MCHC: 29.7 g/dL — ABNORMAL LOW (ref 30.0–36.0)
MCV: 83.7 fL (ref 80.0–100.0)
Monocytes Absolute: 0.7 10*3/uL (ref 0.1–1.0)
Monocytes Relative: 7 %
Neutro Abs: 8.1 10*3/uL — ABNORMAL HIGH (ref 1.7–7.7)
Neutrophils Relative %: 82 %
Platelets: 253 10*3/uL (ref 150–400)
RBC: 3.62 MIL/uL — ABNORMAL LOW (ref 4.22–5.81)
RDW: 16.6 % — ABNORMAL HIGH (ref 11.5–15.5)
WBC: 9.8 10*3/uL (ref 4.0–10.5)
nRBC: 0.2 % (ref 0.0–0.2)

## 2021-01-05 LAB — COMPREHENSIVE METABOLIC PANEL
ALT: 19 U/L (ref 0–44)
AST: 15 U/L (ref 15–41)
Albumin: 2.3 g/dL — ABNORMAL LOW (ref 3.5–5.0)
Alkaline Phosphatase: 90 U/L (ref 38–126)
Anion gap: 11 (ref 5–15)
BUN: 76 mg/dL — ABNORMAL HIGH (ref 8–23)
CO2: 25 mmol/L (ref 22–32)
Calcium: 8.4 mg/dL — ABNORMAL LOW (ref 8.9–10.3)
Chloride: 100 mmol/L (ref 98–111)
Creatinine, Ser: 2.85 mg/dL — ABNORMAL HIGH (ref 0.61–1.24)
GFR, Estimated: 20 mL/min — ABNORMAL LOW (ref >60)
Glucose, Bld: 222 mg/dL — ABNORMAL HIGH (ref 70–99)
Potassium: 4.1 mmol/L (ref 3.5–5.1)
Sodium: 136 mmol/L (ref 135–145)
Total Bilirubin: 0.5 mg/dL (ref 0.3–1.2)
Total Protein: 5.1 g/dL — ABNORMAL LOW (ref 6.5–8.1)

## 2021-01-05 LAB — COOXEMETRY PANEL
Carboxyhemoglobin: 0 % — ABNORMAL LOW (ref 0.5–1.5)
Methemoglobin: 0.3 % (ref 0.0–1.5)
O2 Saturation: 57.1 %
Total hemoglobin: 9.2 g/dL — ABNORMAL LOW (ref 12.0–16.0)

## 2021-01-05 LAB — GLUCOSE, CAPILLARY
Glucose-Capillary: 169 mg/dL — ABNORMAL HIGH (ref 70–99)
Glucose-Capillary: 211 mg/dL — ABNORMAL HIGH (ref 70–99)
Glucose-Capillary: 164 mg/dL — ABNORMAL HIGH (ref 70–99)
Glucose-Capillary: 181 mg/dL — ABNORMAL HIGH (ref 70–99)
Glucose-Capillary: 225 mg/dL — ABNORMAL HIGH (ref 70–99)

## 2021-01-05 LAB — BRAIN NATRIURETIC PEPTIDE: B Natriuretic Peptide: 1925.1 pg/mL — ABNORMAL HIGH (ref 0.0–100.0)

## 2021-01-05 LAB — MAGNESIUM: Magnesium: 2 mg/dL (ref 1.7–2.4)

## 2021-01-05 LAB — C-REACTIVE PROTEIN: CRP: 6.1 mg/dL — ABNORMAL HIGH (ref <1.0)

## 2021-01-05 MED ORDER — TORSEMIDE 20 MG PO TABS
20.0000 mg | ORAL_TABLET | Freq: Every day | ORAL | Status: DC
  Administered 2021-01-05 – 2021-01-07 (×3): 20 mg via ORAL
  Filled 2021-01-05 (×3): qty 1

## 2021-01-05 NOTE — Progress Notes (Signed)
Pt. Was seen for skilled OT to maximize I and safety with ADLs and ADLs transfers. Worked on [employer]. Acute OT to continue.   01/05/21 1300  OT Visit Information  Last OT Received On 01/05/21  Assistance Needed +1  History of Present Illness 87 y.o. [name] was admitted on 10/16 for a fall at home with pt hitting his head but cleared for fractures.  Had recent admission for positive covid test, still testing positive and SOB.  Has PVC's on telemetry, CHF exacerbation, LE edema.  PMHx:  CAD s/p CABG, CKD stage IIIb, DM2, ischemic cardiomyopathy with systolic and diastolic congestive heart failure, moderate aortic stenosis, paroxysmal atrial fibrillation, HTN, gout.  Precautions  Precautions Fall  Precaution Comments monitor O2 and HR  Pain Assessment  Pain Assessment No/denies pain  Cognition  Arousal/Alertness Awake/alert  Behavior During Therapy WFL for tasks assessed/performed  Overall Cognitive Status Within Functional Limits for tasks assessed  Upper Extremity Assessment  Upper Extremity Assessment RUE deficits/detail  RUE Deficits / Details pt. is not able to lift against gravity  RUE  (Pt. states his shld is worn out. forward flex aarom 30 degrees.)  RUE Sensation WNL  RUE Coordination WNL  ADL  Overall ADL's  Needs assistance/impaired  Eating/Feeding Independent;Sitting  Grooming Set up;Standing;Wash/dry face;Oral care;Brushing hair  Upper Body Bathing Set up;Sitting  Lower Body Bathing Min guard;Sit to/from stand  Upper Body Dressing  Set up;Sitting  Lower Body Dressing Min guard;Sit to/from Environmental education officer guard;Ambulation  Toileting- Forensic psychologist Min guard;Sit to/from stand  Functional mobility during ADLs Min guard;Rolling walker  Bed Mobility  Supine to sit Modified independent (Device/Increase time)  Balance  Sitting balance-Leahy Scale Good  Standing balance-Leahy Scale Fair  Restrictions  Weight Bearing Restrictions No  Vision-  Assessment  Vision Assessment? No apparent visual deficits  Transfers  Transfers Stand Pivot Transfers  Sit to Stand Min guard  Stand pivot transfers Min guard  General transfer comment cues for proper hand placement  Exercises  Exercises General Upper Extremity  General Exercises - Upper Extremity  Shoulder Flexion AROM;AAROM;Both;20 reps;Seated  OT - End of Session  Equipment Utilized During Treatment Rolling walker;Oxygen  Activity Tolerance Patient tolerated treatment well  Patient left in chair;with call bell/phone within reach;with chair alarm set  Nurse Communication  (nurse ok therapy)  OT Assessment/Plan  OT Plan Discharge plan remains appropriate  OT Visit Diagnosis Unsteadiness on feet (R26.81);Other abnormalities of gait and mobility (R26.89);History of falling (Z91.81);Muscle weakness (generalized) (M62.81)  OT Frequency (ACUTE ONLY) Min 2X/week  Follow Up Recommendations Home health OT;Supervision - Intermittent  OT Equipment None recommended by OT  AM-PAC OT "6 Clicks" Daily Activity Outcome Measure (Version 2)  Help from another person eating meals? 4  Help from another person taking care of personal grooming? 3  Help from another person toileting, which includes using toliet, bedpan, or urinal? 3  Help from another person bathing (including washing, rinsing, drying)? 3  Help from another person to put on and taking off regular upper body clothing? 3  Help from another person to put on and taking off regular lower body clothing? 3  6 Click Score 19  Progressive Mobility  What is the highest level of mobility based on the progressive mobility assessment? Level 4 (Walks with assist in room) - Balance while marching in place and cannot step forward and back - Complete  Mobility Ambulated with assistance in room  OT Goal Progression  Progress towards OT  goals Progressing toward goals  Acute Rehab OT Goals  Patient Stated Goal get better  OT Goal Formulation With  patient  Time For Goal Achievement 01/17/21  Potential to Achieve Goals Good  ADL Goals  Pt Will Perform Lower Body Bathing with modified independence;sit to/from stand  Pt Will Perform Lower Body Dressing with modified independence;sit to/from stand  Pt Will Transfer to Toilet with modified independence;ambulating  Pt Will Perform Tub/Shower Transfer Tub transfer;with modified independence;ambulating;rolling walker;shower seat  Additional ADL Goal #1 Pt to verbalize at least 3 fall prevention strategies for home environment  Additional ADL Goal #2 Pt to improve standing activity tolerance to > 15 min to maximize endurance for ADL/IADL tasks  OT Time Calculation  OT Start Time (ACUTE ONLY) 1317  OT Stop Time (ACUTE ONLY) 1411  OT Time Calculation (min) 54 min  OT General Charges  $OT Visit 1 Visit  OT Treatments  $Self Care/Home Management  23-37 mins  $Therapeutic Activity 8-22 mins  $Therapeutic Exercise 8-22 mins  Reece Packer OT/L
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
## 2021-01-05 NOTE — Progress Notes (Signed)
Pt refusing CPAP. No unit in room at this time. Patient doing well on 3 lpm nasal cannula; sats 98%.

## 2021-01-05 NOTE — Progress Notes (Signed)
[facility]                                                                              Patient Demographics  [name], is a 87 y.o. male, DOB - May 22, 1928, [name]:[id number]  Admit date - 01/01/2021   Admitting Physician Orma Flaming, MD  Outpatient Primary MD for the patient is Panosh, Standley Brooking, MD  Outpatient specialists:   LOS - 4  days   Medical records reviewed and are as summarized below:    Chief Complaint  Patient presents with   Shortness of Breath   Dizziness   Leg Swelling       Brief summary   Patient is a 87 y.o. 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  Recently discharged on 12/27/2020 for CHF exacerbation and has continued to become progressively more short of breath since this time, he developed shortness of breath along with some dry cough, work-up suggestive of bronchitis along with underlying CHF.  Patient was admitted for further work-up.   Assessment & Plan    Principal Problem: Acute respiratory failure with hypoxia due to bacterial bronchitis  Acute on chronic systolic CHF (congestive heart failure) ([place]) -Patient was placed on Zithromax, Mucinex, flutter valve, supportive care -CHF team following, transitioning to oral torsemide.   -Continue Farxiga, spironolactone.  Holding Coreg, hydralazine/Imdur -Negative balance of 2.2 L   Active Problems: Paroxysmal atrial fibrillation -On IV amiodarone -Plan for DCCV in a.m., continue eliquis   Recent COVID-19 infection -Patient had tested positive on 12/24/2020 and had received monoclonal antibody infusion during the last hospitalization -Off isolation now  Acute kidney injury superimposed on stage IIIb CKD -Baseline creatinine 2.15 -Creatinine worsening, 2.8 today, IV  Lasix discontinued -No ACE/ARB  Essential hypertension -BP stable, continue Aldactone, torsemide  Obstructive sleep apnea -Continue CPAP  Iron deficiency anemia H&H stable  Restless leg syndrome -Continue Mirapex  Diabetes mellitus type 2, IDDM uncontrolled with hyperglycemia, CKD 3B -CBGs controlled -Continue sliding scale insulin, basal insulin 30 units qhs  Pressure injury, left arm anterior upper skin tear -Present on admission, wound care per nursing Pressure Injury 12/26/20 Arm Anterior;Left;Proximal;Upper skin tear (Active)  12/26/20 0106  Location: Arm  Location Orientation: Anterior;Left;Proximal;Upper  Staging:   Wound Description (Comments): skin tear  Present on Admission: Yes     Code Status: DNR DVT Prophylaxis:  Place TED hose Start: 01/02/21 1518 apixaban (ELIQUIS) tablet 2.5 mg Start: 01/01/21 2200 apixaban (ELIQUIS) tablet 2.5 mg   Level of Care: Level of care: Telemetry Cardiac Family Communication: Discussed all imaging results, lab results, explained to the patient    Disposition Plan:     Status is: Inpatient  Remains inpatient appropriate because: Still on diuresis, awaiting DCCV  Time Spent in minutes 53mins   Procedures:    Consultants:   Cardiology  Antimicrobials:   Anti-infectives (From admission, onward)    Start     Dose/Rate Route Frequency Ordered Stop  01/03/21 1000  azithromycin (ZITHROMAX) tablet 250 mg  Status:  Discontinued        250 mg Oral Daily 01/02/21 1049 01/02/21 1119   01/02/21 1130  doxycycline (VIBRA-TABS) tablet 100 mg        100 mg Oral Every 12 hours 01/02/21 1119 01/07/21 0959   01/02/21 1100  azithromycin (ZITHROMAX) tablet 500 mg  Status:  Discontinued        500 mg Oral  Once 01/02/21 1048 01/02/21 1119          Medications  Scheduled Meds:  allopurinol  100 mg Oral Daily   apixaban  2.5 mg Oral BID   atorvastatin  40 mg Oral Daily   Chlorhexidine Gluconate Cloth  6 each Topical Daily    dapagliflozin propanediol  10 mg Oral QAC breakfast   doxycycline  100 mg Oral Q12H   escitalopram  10 mg Oral Daily   gabapentin  100 mg Oral BID   guaiFENesin  600 mg Oral BID   insulin aspart  0-9 Units Subcutaneous TID WC   insulin glargine-yfgn  30 Units Subcutaneous QHS   isosorbide mononitrate  15 mg Oral Daily   loratadine  10 mg Oral Daily   pramipexole  0.25 mg Oral Q24H   sodium chloride flush  10-40 mL Intracatheter Q12H   sodium chloride flush  3 mL Intravenous Q12H   spironolactone  25 mg Oral Daily   tamsulosin  0.4 mg Oral QPC breakfast   torsemide  20 mg Oral Daily   vitamin B-12  1,000 mcg Oral Daily   Continuous Infusions:  sodium chloride     amiodarone 30 mg/hr (01/05/21 1232)   milrinone 0.375 mcg/kg/min (01/05/21 1028)   PRN Meds:.sodium chloride, acetaminophen **OR** acetaminophen, lidocaine, sodium chloride flush, sodium chloride flush      Subjective:   [name] was seen and examined today.  No acute complaints, still has shortness of breath, does not feel quite at baseline yet.  Awaiting cardioversion.  No chest pain, fevers or chills.  No coughing.  No acute events overnight.  Objective:   Vitals:   01/04/21 1935 01/05/21 0400 01/05/21 0837 01/05/21 1115  BP: (!) 118/59 (!) 108/59 116/87 131/65  Pulse: 88 89 82 76  Resp: 15 17 18 19   Temp: 97.6 F (36.4 C) 98.5 F (36.9 C) (!) 97.5 F (36.4 C) 97.6 F (36.4 C)  TempSrc: Oral Oral Oral Oral  SpO2: 95% 100% 100% 97%  Weight:  77.2 kg    Height:        Intake/Output Summary (Last 24 hours) at 01/05/2021 1608 Last data filed at 01/05/2021 1500 Gross per 24 hour  Intake 1574.58 ml  Output 900 ml  Net 674.58 ml     Wt Readings from Last 3 Encounters:  01/05/21 77.2 kg  12/27/20 76.9 kg  11/17/20 77.5 kg   Physical Exam General: Alert and oriented x 3, NAD Cardiovascular: Irregularly irregular Respiratory: Diminished breath sounds Gastrointestinal: Soft, nontender,  nondistended, NBS Ext: 1+ edema Neuro: no new deficits     Data Reviewed:  I have personally reviewed following labs and imaging studies  Micro Results Recent Results (from the past 240 hour(s))  Resp Panel by RT-PCR (Flu A&B, Covid) Nasopharyngeal Swab     Status: Abnormal   Collection Time: 01/01/21  2:34 PM   Specimen: Nasopharyngeal Swab; Nasopharyngeal(NP) swabs in vial transport medium  Result Value Ref Range Status   SARS Coronavirus 2 by RT PCR POSITIVE (A)  NEGATIVE Final    Comment: RESULT CALLED TO, READ BACK BY AND VERIFIED WITH: RN J.COOK ON [id number] AT 1650 BY E.PARRISH (NOTE) SARS-CoV-2 target nucleic acids are DETECTED.  The SARS-CoV-2 RNA is generally detectable in upper respiratory specimens during the acute phase of infection. Positive results are indicative of the presence of the identified virus, but do not rule out bacterial infection or co-infection with other pathogens not detected by the test. Clinical correlation with patient history and other diagnostic information is necessary to determine patient infection status. The expected result is Negative.  Fact Sheet for Patients: EntrepreneurPulse.com.au  Fact Sheet for Healthcare Providers: IncredibleEmployment.be  This test is not yet approved or cleared by the [place] FDA and  has been authorized for detection and/or diagnosis of SARS-CoV-2 by FDA under an Emergency Use Authorization (EUA).  This EUA will remain in effect (meaning this te st can be used) for the duration of  the COVID-19 declaration under Section 564(b)(1) of the Act, 21 U.S.C. section 360bbb-3(b)(1), unless the authorization is terminated or revoked sooner.     Influenza A by PCR NEGATIVE NEGATIVE Final   Influenza B by PCR NEGATIVE NEGATIVE Final    Comment: (NOTE) The Xpert Xpress SARS-CoV-2/FLU/RSV plus assay is intended as an aid in the diagnosis of influenza from Nasopharyngeal swab  specimens and should not be used as a sole basis for treatment. Nasal washings and aspirates are unacceptable for Xpert Xpress SARS-CoV-2/FLU/RSV testing.  Fact Sheet for Patients: EntrepreneurPulse.com.au  Fact Sheet for Healthcare Providers: IncredibleEmployment.be  This test is not yet approved or cleared by the [place] FDA and has been authorized for detection and/or diagnosis of SARS-CoV-2 by FDA under an Emergency Use Authorization (EUA). This EUA will remain in effect (meaning this test can be used) for the duration of the COVID-19 declaration under Section 564(b)(1) of the Act, 21 U.S.C. section 360bbb-3(b)(1), unless the authorization is terminated or revoked.  Performed at [facility], [place] [address], [place], [place] [address]     Radiology Reports CT Head Wo Contrast  Result Date: 01/01/2021 CLINICAL DATA:  Head trauma, mod-severe fall, hematoma to crown of head EXAM: CT HEAD WITHOUT CONTRAST TECHNIQUE: Contiguous axial images were obtained from the base of the skull through the vertex without intravenous contrast. COMPARISON:  12/26/2020 FINDINGS: Brain: There is atrophy and chronic small vessel disease changes. No acute intracranial abnormality. Specifically, no hemorrhage, hydrocephalus, mass lesion, acute infarction, or significant intracranial injury. Vascular: No hyperdense vessel or unexpected calcification. Skull: No acute calvarial abnormality. Sinuses/Orbits: No acute findings Other: None IMPRESSION: Atrophy, chronic microvascular disease. No acute intracranial abnormality. Electronically Signed   By: Rolm Baptise M.D.   On: 01/01/2021 16:43   CT HEAD WO CONTRAST (5MM)  Result Date: 12/26/2020 CLINICAL DATA:  Head and neck trauma EXAM: CT HEAD WITHOUT CONTRAST TECHNIQUE: Contiguous axial images were obtained from the base of the skull through the vertex without intravenous contrast. COMPARISON:  12/16/2009  FINDINGS: Brain: Progressive confluent hypodensities throughout the periventricular white matter consistent with chronic small vessel ischemic changes. No signs of acute infarct or hemorrhage. Lateral ventricles and midline structures are unremarkable. No acute extra-axial fluid collections. No mass effect. Vascular: Prominent atherosclerosis of the internal carotid arteries. No hyperdense vessel. Skull: Small right parietal scalp hematoma. No underlying fracture. Negative for fracture or focal lesion. Sinuses/Orbits: Mild mucosal thickening within the ethmoid and sphenoid sinuses. Other: None. IMPRESSION: 1. No acute intracranial process. 2. Small right parietal scalp hematoma. Electronically  Signed   By: Randa Ngo M.D.   On: 12/26/2020 21:26   CT CERVICAL SPINE WO CONTRAST  Result Date: 12/26/2020 CLINICAL DATA:  Head and neck trauma EXAM: CT CERVICAL SPINE WITHOUT CONTRAST TECHNIQUE: Multidetector CT imaging of the cervical spine was performed without intravenous contrast. Multiplanar CT image reconstructions were also generated. COMPARISON:  None. FINDINGS: Alignment: Alignment is grossly anatomic. Skull base and vertebrae: No acute fracture. No primary bone lesion or focal pathologic process. Soft tissues and spinal canal: No prevertebral fluid or swelling. No visible canal hematoma. 2.3 x 2.0 x 2.5 cm hypodense nodule arises from the lower pole of the left lobe thyroid. Disc levels: Mild diffuse facet hypertrophy. Disc spaces are well preserved. Upper chest: Central airway is patent.  Lung apices are clear. Other: Reconstructed images demonstrate no additional findings. IMPRESSION: 1. No acute cervical spine fracture. 2. Incidental 2.5 cm left lobe thyroid nodule. Recommend nonemergent thyroid US if clinically warranted given patient age. Electronically Signed   By: Randa Ngo M.D.   On: 12/26/2020 21:31   DG Chest Port 1 View  Result Date: 01/04/2021 CLINICAL DATA:  S/P PICC central line  placement EXAM: PORTABLE CHEST - 1 VIEW COMPARISON:  01/03/2019 FINDINGS: Right arm PICC line to the cavoatrial junction. Stable left subclavian AICD. Coarse airspace opacities at the left lung base, obscuring the left diaphragmatic leaflet. Heart size upper limits normal. Aortic Atherosclerosis (ICD10-170.0). CABG markers. Blunting of left lateral costophrenic angle suggesting small effusion. No pneumothorax. Right shoulder DJD. IMPRESSION: Right arm PICC line to the cavoatrial junction. Coarse left lower lobe airspace opacities. Electronically Signed   By: Lucrezia Europe M.D.   On: 01/04/2021 12:18   DG Chest Port 1 View  Result Date: 01/02/2021 CLINICAL DATA:  Reason for exam: sob Covid + Patient reports sob without any chest pains. Hx of afib, chf, diabetes, htn, prior MI 2005. Hx of defibrillator, cabg x3 2005. EXAM: PORTABLE CHEST - 1 VIEW COMPARISON:  01/01/2021 FINDINGS: Left subclavian AICD stable. Relatively low lung volumes with some crowding of perihilar and bibasilar bronchovascular structures. No confluent airspace disease. Stable borderline cardiomegaly. CABG markers. Aortic Atherosclerosis (ICD10-170.0). No effusion.  No pneumothorax. Sternotomy wires. Right shoulder DJD. Cholecystectomy clips. vertebral endplate spurring at multiple levels in the lower thoracic spine. IMPRESSION: Low volumes.  Stable chronic and postop changes as above. Electronically Signed   By: Lucrezia Europe M.D.   On: 01/02/2021 07:33   DG Chest Portable 1 View  Result Date: 01/01/2021 CLINICAL DATA:  Shortness of breath EXAM: PORTABLE CHEST 1 VIEW COMPARISON:  12/27/2020 FINDINGS: Left AICD remains in place, unchanged. Prior CABG. Cardiomegaly, vascular congestion. No overt edema. No confluent opacities or effusions. IMPRESSION: Cardiomegaly, vascular congestion. Electronically Signed   By: Rolm Baptise M.D.   On: 01/01/2021 15:46   DG CHEST PORT 1 VIEW  Result Date: 12/27/2020 CLINICAL DATA:  Shortness of breath, COVID  EXAM: PORTABLE CHEST 1 VIEW COMPARISON:  12/25/2020 FINDINGS: Cardiomegaly with left chest multi lead pacer defibrillator. Prominent pulmonary vasculature without acute airspace opacity. Possible trace pleural effusions and or pleural thickening. IMPRESSION: Cardiomegaly and pulmonary vascular congestion. No acute airspace opacity. Possible trace pleural effusions and or pleural thickening. Electronically Signed   By: Delanna Ahmadi M.D.   On: 12/27/2020 10:51   DG Chest Portable 1 View  Result Date: 12/25/2020 CLINICAL DATA:  COVID positive. Shortness of breath. EXAM: PORTABLE CHEST 1 VIEW COMPARISON:  January 2020 FINDINGS: Cardiac pacemaker and postsurgical changes are stable.  Enlarged cardiac silhouette. Calcific atherosclerotic disease and tortuosity of the aorta. Subtle patchy areas of airspace consolidation in the lung bases. Osseous structures are without acute abnormality. Soft tissues are grossly normal. IMPRESSION: Subtle patchy areas of airspace consolidation in the lung bases may represent early atypical pneumonia. Electronically Signed   By: Fidela Salisbury M.D.   On: 12/25/2020 18:09   VAS [place] LOWER EXTREMITY VENOUS (DVT)  Result Date: 12/27/2020  Lower Venous DVT Study Patient Name:  DEVANTE CAPANO  Date of Exam:   12/27/2020 Medical Rec #: [id number]        Accession #:    [id number] Date of Birth: [birth year]        Patient Gender: M Patient Age:   39 years Exam Location:  [facility] Procedure:      VAS [place] LOWER EXTREMITY VENOUS (DVT) Referring Phys: A POWELL JR --------------------------------------------------------------------------------  Indications: Left > right LE edema.  Risk Factors: COVID-19 +. Comparison Study: No prior study Performing Technologist: Maudry Mayhew MHA, RDMS, RVT, RDCS  Examination Guidelines: A complete evaluation includes B-mode imaging, spectral Doppler, color Doppler, and power Doppler as needed of all accessible portions of each vessel.  Bilateral testing is considered an integral part of a complete examination. Limited examinations for reoccurring indications may be performed as noted. The reflux portion of the exam is performed with the patient in reverse Trendelenburg.  +-----+---------------+---------+-----------+----------+--------------+ RIGHTCompressibilityPhasicitySpontaneityPropertiesThrombus Aging +-----+---------------+---------+-----------+----------+--------------+ CFV  Full           Yes      Yes                                 +-----+---------------+---------+-----------+----------+--------------+   +---------+---------------+---------+-----------+----------+--------------+ LEFT     CompressibilityPhasicitySpontaneityPropertiesThrombus Aging +---------+---------------+---------+-----------+----------+--------------+ CFV      Full           Yes      Yes                                 +---------+---------------+---------+-----------+----------+--------------+ SFJ      Full                                                        +---------+---------------+---------+-----------+----------+--------------+ FV Prox  Full                                                        +---------+---------------+---------+-----------+----------+--------------+ FV Mid   Full                                                        +---------+---------------+---------+-----------+----------+--------------+ FV DistalFull                                                        +---------+---------------+---------+-----------+----------+--------------+  PFV      Full                                                        +---------+---------------+---------+-----------+----------+--------------+ POP      Full           Yes      Yes                                 +---------+---------------+---------+-----------+----------+--------------+ PTV      Full                                                         +---------+---------------+---------+-----------+----------+--------------+ PERO     Full                                                        +---------+---------------+---------+-----------+----------+--------------+ Gastroc  Full                    Yes                                 +---------+---------------+---------+-----------+----------+--------------+    Summary: RIGHT: - No evidence of common femoral vein obstruction.  LEFT: - There is no evidence of deep vein thrombosis in the lower extremity.  - No cystic structure found in the popliteal fossa.  *See table(s) above for measurements and observations. Electronically signed by Servando Snare MD on 12/27/2020 at 1:44:21 PM.    Final    [place] EKG SITE RITE  Result Date: 01/04/2021 If Site Rite image not attached, placement could not be confirmed due to current cardiac rhythm.   Lab Data:  CBC: Recent Labs  Lab 01/01/21 1439 01/02/21 0439 01/03/21 0403 01/04/21 0411 01/05/21 0350  WBC 13.6* 15.1* 14.9* 12.9* 9.8  NEUTROABS 11.6*  --  12.5* 11.0* 8.1*  HGB 9.1* 9.0* 9.4* 9.9* 9.0*  HCT 30.9* 32.1* 32.2* 33.3* 30.3*  MCV 86.1 87.9 85.9 84.3 83.7  PLT 220 221 230 247 314   Basic Metabolic Panel: Recent Labs  Lab 01/01/21 1439 01/01/21 2046 01/02/21 0439 01/02/21 0808 01/03/21 0403 01/04/21 0411 01/05/21 0350  NA 138  --  138  --  139 130* 136  K 4.2  --  3.8  --  4.2 3.9 4.1  CL 102  --  103  --  104 96* 100  CO2 25  --  25  --  26 26 25   GLUCOSE 253*  --  215*  --  94 198* 222*  BUN 70*  --  67*  --  64* 66* 76*  CREATININE 2.64*  --  2.42*  --  2.23* 2.45* 2.85*  CALCIUM 8.6*  --  8.5*  --  8.5* 8.2* 8.4*  MG  --  2.2  --  2.3 2.0 2.0 2.0   GFR: Estimated Creatinine Clearance:  18.8 mL/min (A) (by C-G formula based on SCr of 2.85 mg/dL (H)). Liver Function Tests: Recent Labs  Lab 01/03/21 0403 01/04/21 0411 01/05/21 0350  AST 14* 15 15  ALT 24 22 19   ALKPHOS 79 91 90  BILITOT  0.8 0.5 0.5  PROT 5.2* 5.4* 5.1*  ALBUMIN 2.4* 2.5* 2.3*   No results for input(s): LIPASE, AMYLASE in the last 168 hours. No results for input(s): AMMONIA in the last 168 hours. Coagulation Profile: No results for input(s): INR, PROTIME in the last 168 hours. Cardiac Enzymes: No results for input(s): CKTOTAL, CKMB, CKMBINDEX, TROPONINI in the last 168 hours. BNP (last 3 results) No results for input(s): PROBNP in the last 8760 hours. HbA1C: No results for input(s): HGBA1C in the last 72 hours. CBG: Recent Labs  Lab 01/04/21 1623 01/04/21 2123 01/05/21 0611 01/05/21 0827 01/05/21 1112  GLUCAP 191* 288* 169* 211* 164*   Lipid Profile: No results for input(s): CHOL, HDL, LDLCALC, TRIG, CHOLHDL, LDLDIRECT in the last 72 hours. Thyroid Function Tests: No results for input(s): TSH, T4TOTAL, FREET4, T3FREE, THYROIDAB in the last 72 hours. Anemia Panel: No results for input(s): VITAMINB12, FOLATE, FERRITIN, TIBC, IRON, RETICCTPCT in the last 72 hours. Urine analysis:    Component Value Date/Time   COLORURINE YELLOW 01/01/2021 2046   APPEARANCEUR CLEAR 01/01/2021 2046   LABSPEC 1.010 01/01/2021 2046   PHURINE 5.0 01/01/2021 2046   GLUCOSEU >=500 (A) 01/01/2021 2046   HGBUR NEGATIVE 01/01/2021 2046   HGBUR negative 07/14/2008 0852   BILIRUBINUR NEGATIVE 01/01/2021 2046   BILIRUBINUR n 08/15/2016 [place] 01/01/2021 2046   PROTEINUR NEGATIVE 01/01/2021 2046   UROBILINOGEN 0.2 08/15/2016 1322   UROBILINOGEN 0.2 04/29/2014 1006   NITRITE NEGATIVE 01/01/2021 2046   LEUKOCYTESUR NEGATIVE 01/01/2021 2046     Kaycie Pegues M.D. [facility] 01/05/2021, 4:08 PM  Available via Epic secure chat 7am-7pm After 7 pm, please refer to night coverage provider listed on amion.

## 2021-01-05 NOTE — Progress Notes (Signed)
Patient ID: [name], male   DOB: 1928-07-07, 87 y.o.   MRN: [id number]     Advanced Heart Failure Rounding Note  PCP-Cardiologist: None   Subjective:    Weight down 1 lb.  Co-ox 57% today on milrinone 0.375.  CVP down to 7 this morning, improved.  Creatinine higher at 2.85. SBP 100s-110s.     Objective:   Weight Range: 77.2 kg Body mass index is 21.87 kg/m.   Vital Signs:   Temp:  [97.6 F (36.4 C)-98.5 F (36.9 C)] 98.5 F (36.9 C) (10/20 0400) Pulse Rate:  [88-89] 89 (10/20 0400) Resp:  [14-20] 17 (10/20 0400) BP: (108-128)/(59-69) 108/59 (10/20 0400) SpO2:  [91 %-100 %] 100 % (10/20 0400) Weight:  [77.2 kg] 77.2 kg (10/20 0400) Last BM Date: 01/04/21  Weight change: Filed Weights   01/03/21 0629 01/04/21 0632 01/05/21 0400  Weight: 78.1 kg 77.8 kg 77.2 kg    Intake/Output:   Intake/Output Summary (Last 24 hours) at 01/05/2021 0737 Last data filed at 01/05/2021 0437 Gross per 24 hour  Intake 360 ml  Output 1200 ml  Net -840 ml      Physical Exam    General: NAD Neck: JVP 7-8 cm, no thyromegaly or thyroid nodule.  Lungs: Clear to auscultation bilaterally with normal respiratory effort. CV: Nondisplaced PMI.  Heart irregular S1/S2, no S3/S4, 2/6 SEM RUSB.  Trace ankle edema.   Abdomen: Soft, nontender, no hepatosplenomegaly, no distention.  Skin: Intact without lesions or rashes.  Neurologic: Alert and oriented x 3.  Psych: Normal affect. Extremities: No clubbing or cyanosis.  HEENT: Normal.    Telemetry   Atrial fibrillation 80s (personally reviewed)  Labs    CBC Recent Labs    01/04/21 0411 01/05/21 0350  WBC 12.9* 9.8  NEUTROABS 11.0* 8.1*  HGB 9.9* 9.0*  HCT 33.3* 30.3*  MCV 84.3 83.7  PLT 247 932   Basic Metabolic Panel Recent Labs    01/04/21 0411 01/05/21 0350  NA 130* 136  K 3.9 4.1  CL 96* 100  CO2 26 25  GLUCOSE 198* 222*  BUN 66* 76*  CREATININE 2.45* 2.85*  CALCIUM 8.2* 8.4*  MG 2.0 2.0   Liver Function  Tests Recent Labs    01/04/21 0411 01/05/21 0350  AST 15 15  ALT 22 19  ALKPHOS 91 90  BILITOT 0.5 0.5  PROT 5.4* 5.1*  ALBUMIN 2.5* 2.3*   No results for input(s): LIPASE, AMYLASE in the last 72 hours. Cardiac Enzymes No results for input(s): CKTOTAL, CKMB, CKMBINDEX, TROPONINI in the last 72 hours.  BNP: BNP (last 3 results) Recent Labs    01/03/21 0403 01/04/21 0411 01/05/21 0350  BNP 2,227.4* 2,990.9* 1,925.1*    ProBNP (last 3 results) No results for input(s): PROBNP in the last 8760 hours.   D-Dimer No results for input(s): DDIMER in the last 72 hours. Hemoglobin A1C No results for input(s): HGBA1C in the last 72 hours. Fasting Lipid Panel No results for input(s): CHOL, HDL, LDLCALC, TRIG, CHOLHDL, LDLDIRECT in the last 72 hours. Thyroid Function Tests No results for input(s): TSH, T4TOTAL, T3FREE, THYROIDAB in the last 72 hours.  Invalid input(s): FREET3  Other results:   Imaging    DG Chest Port 1 View  Result Date: 01/04/2021 CLINICAL DATA:  S/P PICC central line placement EXAM: PORTABLE CHEST - 1 VIEW COMPARISON:  01/03/2019 FINDINGS: Right arm PICC line to the cavoatrial junction. Stable left subclavian AICD. Coarse airspace opacities at the left lung  base, obscuring the left diaphragmatic leaflet. Heart size upper limits normal. Aortic Atherosclerosis (ICD10-170.0). CABG markers. Blunting of left lateral costophrenic angle suggesting small effusion. No pneumothorax. Right shoulder DJD. IMPRESSION: Right arm PICC line to the cavoatrial junction. Coarse left lower lobe airspace opacities. Electronically Signed   By: Lucrezia Europe M.D.   On: 01/04/2021 12:18   [place] EKG SITE RITE  Result Date: 01/04/2021 If Site Rite image not attached, placement could not be confirmed due to current cardiac rhythm.    Medications:     Scheduled Medications:  allopurinol  100 mg Oral Daily   apixaban  2.5 mg Oral BID   atorvastatin  40 mg Oral Daily   Chlorhexidine  Gluconate Cloth  6 each Topical Daily   dapagliflozin propanediol  10 mg Oral QAC breakfast   doxycycline  100 mg Oral Q12H   escitalopram  10 mg Oral Daily   gabapentin  100 mg Oral BID   guaiFENesin  600 mg Oral BID   insulin aspart  0-9 Units Subcutaneous TID WC   insulin glargine-yfgn  30 Units Subcutaneous QHS   isosorbide mononitrate  15 mg Oral Daily   loratadine  10 mg Oral Daily   pramipexole  0.25 mg Oral Q24H   sodium chloride flush  10-40 mL Intracatheter Q12H   sodium chloride flush  3 mL Intravenous Q12H   spironolactone  25 mg Oral Daily   tamsulosin  0.4 mg Oral QPC breakfast   torsemide  20 mg Oral Daily   vitamin B-12  1,000 mcg Oral Daily    Infusions:  sodium chloride     amiodarone 30 mg/hr (01/05/21 0013)   milrinone 0.375 mcg/kg/min (01/05/21 0011)    PRN Medications: sodium chloride, acetaminophen **OR** acetaminophen, lidocaine, sodium chloride flush, sodium chloride flush    Assessment/Plan   1. Acute on chronic systolic CHF: Ischemic cardiomyopathy.  Echo in 5/22 with EF 20-25%, low gradient moderate AS with mean gradient 12 mmHg and AVA 1.1. St Jude CRT-D device.  CHF exacerbation in setting of recurrent atrial fibrillation and loss of effective CRT.  Creatinine higher today 2.2 => 2.45 => 2.85.  He is now on milrinone with low co-ox, co-ox up to 57% this morning.  CVP down to 7, breathing is better overall.  - Continue milrinone 0.375 mcg/kg/min today, will try to wean pre-DCCV tomorrow.  - Stop IV Lasix, start home torsemide this afternoon.  - Hold digoxin until creatinine trends down.   - Continue Farxiga 10 mg daily.  - Continue spironolactone 25 daily.  - Hold Coreg and hydral/Imdur for now, SBP stable 100s-110s.  - Needs to regain NSR, see below.  2. Atrial fibrillation: Paroxysmal.  Went into AF on 10/9, likely triggered by COVID-19 infection.  Loss of effective CRT with AF.   - Amiodarone gtt while on milrinone.   - On Eliquis 2.5 bid and  has not missed doses.  - Aim for DCCV Friday.   3. AKI on CKD stage 4: Baseline creatinine around 2.  Creatinine 2.85 today.  - Stopping IV diuresis today.   4. COVID-19: Admitted initially 10/8 with COVID-19. Received monoclonal ab and remdesivir initially. Still with cough though CHF likely contributes to this.  - Now off precautions.  5. CAD: s/p CABG.  No chest pain.  HS-TnI minimally elevated with no trend, likely demand ischemia.  - Continue atorvastatin. - Not on ASA with stable CAD and apixaban use.    Length of Stay: [place]  [name], MD  01/05/2021, 7:37 AM  Advanced Heart Failure Team Pager [phone] (M-F; 7a - 5p)  Please contact [facility] for night-coverage after hours (5p -7a ) and weekends on [email or web address]

## 2021-01-06 ENCOUNTER — Inpatient Hospital Stay (HOSPITAL_COMMUNITY): Payer: No Typology Code available for payment source | Admitting: Certified Registered Nurse Anesthetist

## 2021-01-06 ENCOUNTER — Encounter (HOSPITAL_COMMUNITY)
Admission: EM | Disposition: A | Discharge status: Skilled Nursing Facility | Payer: Self-pay | Source: Skilled Nursing Facility | Attending: Internal Medicine

## 2021-01-06 ENCOUNTER — Encounter (HOSPITAL_COMMUNITY): Payer: Self-pay | Admitting: Family Medicine

## 2021-01-06 DIAGNOSIS — I480 Paroxysmal atrial fibrillation: Secondary | ICD-10-CM | POA: Diagnosis not present

## 2021-01-06 DIAGNOSIS — I4891 Unspecified atrial fibrillation: Secondary | ICD-10-CM | POA: Diagnosis not present

## 2021-01-06 DIAGNOSIS — I5023 Acute on chronic systolic (congestive) heart failure: Secondary | ICD-10-CM | POA: Diagnosis not present

## 2021-01-06 DIAGNOSIS — N171 Acute kidney failure with acute cortical necrosis: Secondary | ICD-10-CM | POA: Diagnosis not present

## 2021-01-06 DIAGNOSIS — I2510 Atherosclerotic heart disease of native coronary artery without angina pectoris: Secondary | ICD-10-CM | POA: Diagnosis not present

## 2021-01-06 HISTORY — PX: CARDIOVERSION: SHX1299

## 2021-01-06 LAB — COMPREHENSIVE METABOLIC PANEL
ALT: 16 U/L (ref 0–44)
AST: 13 U/L — ABNORMAL LOW (ref 15–41)
Albumin: 2.1 g/dL — ABNORMAL LOW (ref 3.5–5.0)
Alkaline Phosphatase: 77 U/L (ref 38–126)
Anion gap: 11 (ref 5–15)
BUN: 79 mg/dL — ABNORMAL HIGH (ref 8–23)
CO2: 25 mmol/L (ref 22–32)
Calcium: 8.1 mg/dL — ABNORMAL LOW (ref 8.9–10.3)
Chloride: 97 mmol/L — ABNORMAL LOW (ref 98–111)
Creatinine, Ser: 2.71 mg/dL — ABNORMAL HIGH (ref 0.61–1.24)
GFR, Estimated: 22 mL/min — ABNORMAL LOW (ref >60)
Glucose, Bld: 231 mg/dL — ABNORMAL HIGH (ref 70–99)
Potassium: 3.5 mmol/L (ref 3.5–5.1)
Sodium: 133 mmol/L — ABNORMAL LOW (ref 135–145)
Total Bilirubin: 0.7 mg/dL (ref 0.3–1.2)
Total Protein: 4.8 g/dL — ABNORMAL LOW (ref 6.5–8.1)

## 2021-01-06 LAB — GLUCOSE, CAPILLARY
Glucose-Capillary: 159 mg/dL — ABNORMAL HIGH (ref 70–99)
Glucose-Capillary: 59 mg/dL — ABNORMAL LOW (ref 70–99)
Glucose-Capillary: 156 mg/dL — ABNORMAL HIGH (ref 70–99)
Glucose-Capillary: 351 mg/dL — ABNORMAL HIGH (ref 70–99)

## 2021-01-06 LAB — CBC WITH DIFFERENTIAL/PLATELET
Abs Immature Granulocytes: 0.09 10*3/uL — ABNORMAL HIGH (ref 0.00–0.07)
Basophils Absolute: 0 10*3/uL (ref 0.0–0.1)
Basophils Relative: 0 %
Eosinophils Absolute: 0 10*3/uL (ref 0.0–0.5)
Eosinophils Relative: 0 %
HCT: 28.4 % — ABNORMAL LOW (ref 39.0–52.0)
Hemoglobin: 8.4 g/dL — ABNORMAL LOW (ref 13.0–17.0)
Immature Granulocytes: 1 %
Lymphocytes Relative: 15 %
Lymphs Abs: 1.4 10*3/uL (ref 0.7–4.0)
MCH: 24.9 pg — ABNORMAL LOW (ref 26.0–34.0)
MCHC: 29.6 g/dL — ABNORMAL LOW (ref 30.0–36.0)
MCV: 84 fL (ref 80.0–100.0)
Monocytes Absolute: 1 10*3/uL (ref 0.1–1.0)
Monocytes Relative: 11 %
Neutro Abs: 6.5 10*3/uL (ref 1.7–7.7)
Neutrophils Relative %: 73 %
Platelets: 276 10*3/uL (ref 150–400)
RBC: 3.38 MIL/uL — ABNORMAL LOW (ref 4.22–5.81)
RDW: 16.5 % — ABNORMAL HIGH (ref 11.5–15.5)
WBC: 8.9 10*3/uL (ref 4.0–10.5)
nRBC: 0 % (ref 0.0–0.2)

## 2021-01-06 LAB — COOXEMETRY PANEL
Carboxyhemoglobin: 0 % — ABNORMAL LOW (ref 0.5–1.5)
Methemoglobin: 0.7 % (ref 0.0–1.5)
O2 Saturation: 65.4 %
Total hemoglobin: 8.1 g/dL — ABNORMAL LOW (ref 12.0–16.0)

## 2021-01-06 LAB — MAGNESIUM: Magnesium: 1.7 mg/dL (ref 1.7–2.4)

## 2021-01-06 LAB — C-REACTIVE PROTEIN: CRP: 3.4 mg/dL — ABNORMAL HIGH (ref <1.0)

## 2021-01-06 LAB — BRAIN NATRIURETIC PEPTIDE: B Natriuretic Peptide: 993.7 pg/mL — ABNORMAL HIGH (ref 0.0–100.0)

## 2021-01-06 SURGERY — CARDIOVERSION
Anesthesia: General

## 2021-01-06 MED ORDER — GUAIFENESIN-DM 100-10 MG/5ML PO SYRP
5.0000 mL | ORAL_SOLUTION | ORAL | Status: DC | PRN
  Administered 2021-01-06 – 2021-01-12 (×3): 5 mL via ORAL
  Filled 2021-01-06 (×3): qty 5

## 2021-01-06 MED ORDER — MAGNESIUM SULFATE 50 % IJ SOLN
1.0000 g | Freq: Once | INTRAMUSCULAR | Status: DC
  Filled 2021-01-06: qty 2

## 2021-01-06 MED ORDER — ETOMIDATE 2 MG/ML IV SOLN
INTRAVENOUS | Status: DC | PRN
  Administered 2021-01-06: 12 mg via INTRAVENOUS

## 2021-01-06 MED ORDER — LIDOCAINE 2% (20 MG/ML) 5 ML SYRINGE
INTRAMUSCULAR | Status: DC | PRN
  Administered 2021-01-06: 100 mg via INTRAVENOUS

## 2021-01-06 MED ORDER — DEXTROSE 50 % IV SOLN
INTRAVENOUS | Status: AC
  Filled 2021-01-06: qty 50

## 2021-01-06 MED ORDER — MAGNESIUM SULFATE IN D5W 1-5 GM/100ML-% IV SOLN
1.0000 g | Freq: Once | INTRAVENOUS | Status: AC
  Administered 2021-01-06: 1 g via INTRAVENOUS
  Filled 2021-01-06: qty 100

## 2021-01-06 MED ORDER — POTASSIUM CHLORIDE CRYS ER 20 MEQ PO TBCR
40.0000 meq | EXTENDED_RELEASE_TABLET | Freq: Once | ORAL | Status: AC
  Administered 2021-01-06: 40 meq via ORAL
  Filled 2021-01-06: qty 2

## 2021-01-06 MED ORDER — DEXTROSE 50 % IV SOLN
1.0000 | Freq: Once | INTRAVENOUS | Status: AC
  Administered 2021-01-06: 50 mL via INTRAVENOUS

## 2021-01-06 MED ORDER — SODIUM CHLORIDE 0.9 % IV SOLN: INTRAVENOUS | Status: DC | PRN

## 2021-01-06 NOTE — Progress Notes (Signed)
Patient refused the use of cpap for the evening.

## 2021-01-06 NOTE — Transfer of Care (Signed)
Immediate Anesthesia Transfer of Care Note  Patient: [name]  Procedure(s) Performed: CARDIOVERSION  Patient Location: Endoscopy Unit  Anesthesia Type:General  Level of Consciousness: drowsy  Airway & Oxygen Therapy: Patient Spontanous Breathing  Post-op Assessment: Report given to RN and Post -op Vital signs reviewed and stable  Post vital signs: Reviewed and stable  Last Vitals:  Vitals Value Taken Time  BP 106/52   Temp    Pulse 80   Resp 14   SpO2 100     Last Pain:  Vitals:   01/06/21 1215  TempSrc: Temporal  PainSc: 0-No pain         Complications: No notable events documented.

## 2021-01-06 NOTE — H&P (View-Only) (Signed)
Patient ID: [name], male   DOB: 08-06-1928, 87 y.o.   MRN: [id number]     Advanced Heart Failure Rounding Note  PCP-Cardiologist: None   Subjective:    Feels better, breathing back to normal.  Walked to door of room yesterday.   Co-ox 65% on milrinone 0.375.  CVP 8 on my measure this morning.  He remains in rate-controlled EF.  Creatinine 2.85 => 2.71.      Objective:   Weight Range: 78.4 kg Body mass index is 22.19 kg/m.   Vital Signs:   Temp:  [97.5 F (36.4 C)-97.9 F (36.6 C)] 97.5 F (36.4 C) (10/21 0405) Pulse Rate:  [76-88] 88 (10/20 2104) Resp:  [12-20] 12 (10/21 0405) BP: (112-131)/(65-87) 112/77 (10/21 0405) SpO2:  [96 %-100 %] 98 % (10/21 0405) Weight:  [78.4 kg] 78.4 kg (10/21 0405) Last BM Date: 01/04/21  Weight change: Filed Weights   01/04/21 0632 01/05/21 0400 01/06/21 0405  Weight: 77.8 kg 77.2 kg 78.4 kg    Intake/Output:   Intake/Output Summary (Last 24 hours) at 01/06/2021 0832 Last data filed at 01/06/2021 0608 Gross per 24 hour  Intake 1940.04 ml  Output 776 ml  Net 1164.04 ml      Physical Exam    General: NAD Neck: JVP 8 cm, no thyromegaly or thyroid nodule.  Lungs: Clear to auscultation bilaterally with normal respiratory effort. CV: Nondisplaced PMI.  Heart irregular S1/S2, no S3/S4, 2/6 early SEM RUSB.  1+ ankle edema.  Abdomen: Soft, nontender, no hepatosplenomegaly, no distention.  Skin: Intact without lesions or rashes.  Neurologic: Alert and oriented x 3.  Psych: Normal affect. Extremities: No clubbing or cyanosis.  HEENT: Normal.    Telemetry   Atrial fibrillation 80s (personally reviewed)  Labs    CBC Recent Labs    01/04/21 0411 01/05/21 0350  WBC 12.9* 9.8  NEUTROABS 11.0* 8.1*  HGB 9.9* 9.0*  HCT 33.3* 30.3*  MCV 84.3 83.7  PLT 247 242   Basic Metabolic Panel Recent Labs    01/05/21 0350 01/06/21 0358  NA 136 133*  K 4.1 3.5  CL 100 97*  CO2 25 25  GLUCOSE 222* 231*  BUN 76* 79*   CREATININE 2.85* 2.71*  CALCIUM 8.4* 8.1*  MG 2.0 1.7   Liver Function Tests Recent Labs    01/05/21 0350 01/06/21 0358  AST 15 13*  ALT 19 16  ALKPHOS 90 77  BILITOT 0.5 0.7  PROT 5.1* 4.8*  ALBUMIN 2.3* 2.1*   No results for input(s): LIPASE, AMYLASE in the last 72 hours. Cardiac Enzymes No results for input(s): CKTOTAL, CKMB, CKMBINDEX, TROPONINI in the last 72 hours.  BNP: BNP (last 3 results) Recent Labs    01/03/21 0403 01/04/21 0411 01/05/21 0350  BNP 2,227.4* 2,990.9* 1,925.1*    ProBNP (last 3 results) No results for input(s): PROBNP in the last 8760 hours.   D-Dimer No results for input(s): DDIMER in the last 72 hours. Hemoglobin A1C No results for input(s): HGBA1C in the last 72 hours. Fasting Lipid Panel No results for input(s): CHOL, HDL, LDLCALC, TRIG, CHOLHDL, LDLDIRECT in the last 72 hours. Thyroid Function Tests No results for input(s): TSH, T4TOTAL, T3FREE, THYROIDAB in the last 72 hours.  Invalid input(s): FREET3  Other results:   Imaging    No results found.   Medications:     Scheduled Medications:  allopurinol  100 mg Oral Daily   apixaban  2.5 mg Oral BID   atorvastatin  40  mg Oral Daily   Chlorhexidine Gluconate Cloth  6 each Topical Daily   dapagliflozin propanediol  10 mg Oral QAC breakfast   doxycycline  100 mg Oral Q12H   escitalopram  10 mg Oral Daily   gabapentin  100 mg Oral BID   guaiFENesin  600 mg Oral BID   insulin aspart  0-9 Units Subcutaneous TID WC   insulin glargine-yfgn  30 Units Subcutaneous QHS   isosorbide mononitrate  15 mg Oral Daily   loratadine  10 mg Oral Daily   magnesium sulfate  1 g Intravenous Once   potassium chloride  40 mEq Oral Once   pramipexole  0.25 mg Oral Q24H   sodium chloride flush  10-40 mL Intracatheter Q12H   sodium chloride flush  3 mL Intravenous Q12H   spironolactone  25 mg Oral Daily   tamsulosin  0.4 mg Oral QPC breakfast   torsemide  20 mg Oral Daily   vitamin B-12   1,000 mcg Oral Daily    Infusions:  sodium chloride     amiodarone 30 mg/hr (01/06/21 0529)   milrinone 0.375 mcg/kg/min (01/06/21 0529)    PRN Medications: sodium chloride, acetaminophen **OR** acetaminophen, lidocaine, sodium chloride flush, sodium chloride flush    Assessment/Plan   1. Acute on chronic systolic CHF: Ischemic cardiomyopathy.  Echo in 5/22 with EF 20-25%, low gradient moderate AS with mean gradient 12 mmHg and AVA 1.1. St Jude CRT-D device.  CHF exacerbation in setting of recurrent atrial fibrillation and loss of effective CRT.  Creatinine now trending back down,  2.2 => 2.45 => 2.85 => 2.71.  He is now on milrinone 0.375 with low co-ox, co-ox up to 65% this morning.  CVP down to 8, breathing is better overall.  Off IV Lasix.  - Decreased milrinone to 0.25 now, will drop to 0.125 pre-DCCV this afternoon.  - Continue home torsemide 20 mg daily.   - Hold digoxin until creatinine trends down.   - Continue Farxiga 10 mg daily.  - Continue spironolactone 25 daily.  - Hold Coreg and hydral/Imdur for now, SBP stable 100s-110s.  - Needs to regain NSR, see below.  2. Atrial fibrillation: Paroxysmal.  Went into AF on 10/9, likely triggered by COVID-19 infection.  Loss of effective CRT with AF.   - Amiodarone gtt while on milrinone.   - On Eliquis 2.5 bid and has not missed doses.  - Aim for DCCV at 1 pm today.  Discussed risks/benefits with patient, he agrees to procedure.    3. AKI on CKD stage 4: Baseline creatinine around 2.  Creatinine 2.85 => 2.71 today.  4. COVID-19: Admitted initially 10/8 with COVID-19. Received monoclonal ab and remdesivir initially. Still with cough though CHF likely contributes to this.  - Now off precautions.  5. CAD: s/p CABG.  No chest pain.  HS-TnI minimally elevated with no trend, likely demand ischemia.  - Continue atorvastatin. - Not on ASA with stable CAD and apixaban use.   Mobilize  Length of Stay: [place], MD   01/06/2021, 8:32 AM  Advanced Heart Failure Team Pager [phone] (M-F; 7a - 5p)  Please contact [facility] for night-coverage after hours (5p -7a ) and weekends on [email or web address]

## 2021-01-06 NOTE — Progress Notes (Signed)
[facility]                                                                              Patient Demographics  [name], is a 87 y.o. male, DOB - [birth year], QBH:[id number]  Admit date - 01/01/2021   Admitting Physician Orma Flaming, MD  Outpatient Primary MD for the patient is Panosh, Standley Brooking, MD  Outpatient specialists:   LOS - 5  days   Medical records reviewed and are as summarized below:    Chief Complaint  Patient presents with   Shortness of Breath   Dizziness   Leg Swelling       Brief summary   Patient is a 87 y.o. 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  Recently discharged on 12/27/2020 for CHF exacerbation and has continued to become progressively more short of breath since this time, he developed shortness of breath along with some dry cough, work-up suggestive of bronchitis along with underlying CHF.  Patient was admitted for further work-up.   Assessment & Plan    Principal Problem: Acute respiratory failure with hypoxia due to bacterial bronchitis  Acute on chronic systolic CHF (congestive heart failure) ([place]) -Patient was placed on Zithromax, Mucinex, flutter valve, supportive care -Negative balance of 1.9 L.  CHF team following, continue Farxiga, Aldactone, torsemide 20 mg daily -Holding Coreg, hydralazine, Imdur. -Plan for DCCV today   Active Problems: Paroxysmal atrial fibrillation -On IV amiodarone, plan for DCCV today -Continue eliquis   Recent COVID-19 infection -Patient had tested positive on 12/24/2020 and had received monoclonal antibody infusion during the last hospitalization -Off isolation now, no hypoxia no acute issues  Acute kidney injury superimposed on stage IIIb CKD -Baseline creatinine  2.15 -Creatinine slightly improving today, 2.7 from 2.8 yesterday   Essential hypertension -Currently stable H&H stable  Obstructive sleep apnea -Continue CPAP  Iron deficiency anemia H&H stable  Restless leg syndrome -Continue Mirapex  Diabetes mellitus type 2, IDDM uncontrolled with hyperglycemia, CKD 3B -CBGs controlled -Continue basal insulin 30 units qhs, sliding scale insulin Recent Labs    01/05/21 1112 01/05/21 1707 01/05/21 2105 01/06/21 0534 01/06/21 1255 01/06/21 1333  GLUCAP 164* 181* 225* 159* 59* 156*     Pressure injury, left arm anterior upper skin tear -Present on admission, wound care per nursing Pressure Injury 12/26/20 Arm Anterior;Left;Proximal;Upper skin tear (Active)  12/26/20 0106  Location: Arm  Location Orientation: Anterior;Left;Proximal;Upper  Staging:   Wound Description (Comments): skin tear  Present on Admission: Yes     Code Status: DNR DVT Prophylaxis:  Place TED hose Start: 01/02/21 1518 apixaban (ELIQUIS) tablet 2.5 mg Start: 01/01/21 2200 apixaban (ELIQUIS) tablet 2.5 mg   Level of Care: Level of care: Telemetry Cardiac Family Communication: Discussed all imaging results, lab results, explained to the patient    Disposition Plan:     Status is: Inpatient  Remains inpatient appropriate because: Plan for DCCV today  Time Spent in  minutes 24mins   Procedures:  Cardioversion  Consultants:   Cardiology  Antimicrobials:   Anti-infectives (From admission, onward)    Start     Dose/Rate Route Frequency Ordered Stop   01/03/21 1000  azithromycin (ZITHROMAX) tablet 250 mg  Status:  Discontinued        250 mg Oral Daily 01/02/21 1049 01/02/21 1119   01/02/21 1130  doxycycline (VIBRA-TABS) tablet 100 mg        100 mg Oral Every 12 hours 01/02/21 1119 01/07/21 0959   01/02/21 1100  azithromycin (ZITHROMAX) tablet 500 mg  Status:  Discontinued        500 mg Oral  Once 01/02/21 1048 01/02/21 1119           Medications  Scheduled Meds:  allopurinol  100 mg Oral Daily   apixaban  2.5 mg Oral BID   atorvastatin  40 mg Oral Daily   Chlorhexidine Gluconate Cloth  6 each Topical Daily   dapagliflozin propanediol  10 mg Oral QAC breakfast   doxycycline  100 mg Oral Q12H   escitalopram  10 mg Oral Daily   gabapentin  100 mg Oral BID   guaiFENesin  600 mg Oral BID   insulin aspart  0-9 Units Subcutaneous TID WC   insulin glargine-yfgn  30 Units Subcutaneous QHS   isosorbide mononitrate  15 mg Oral Daily   loratadine  10 mg Oral Daily   pramipexole  0.25 mg Oral Q24H   sodium chloride flush  10-40 mL Intracatheter Q12H   sodium chloride flush  3 mL Intravenous Q12H   spironolactone  25 mg Oral Daily   tamsulosin  0.4 mg Oral QPC breakfast   torsemide  20 mg Oral Daily   vitamin B-12  1,000 mcg Oral Daily   Continuous Infusions:  sodium chloride     amiodarone 30 mg/hr (01/06/21 0529)   milrinone 0.125 mcg/kg/min (01/06/21 1230)   PRN Meds:.sodium chloride, acetaminophen **OR** acetaminophen, lidocaine, sodium chloride flush, sodium chloride flush      Subjective:   [name] was seen and examined today.  No acute complaints, no chest pain or acute shortness of breath.  No nausea vomiting abdominal pain.  Objective:   Vitals:   01/06/21 1215 01/06/21 1316 01/06/21 1327 01/06/21 1335  BP: (!) 126/49 (!) 106/52 127/69 132/65  Pulse: 81 76 85 83  Resp: 16 12 16 15   Temp: (!) 96.6 F (35.9 C) 97.8 F (36.6 C)    TempSrc: Temporal Temporal    SpO2: 98% 100% 98% 99%  Weight:      Height:        Intake/Output Summary (Last 24 hours) at 01/06/2021 1526 Last data filed at 01/06/2021 1309 Gross per 24 hour  Intake 1065.46 ml  Output 776 ml  Net 289.46 ml     Wt Readings from Last 3 Encounters:  01/06/21 78.4 kg  12/27/20 76.9 kg  11/17/20 77.5 kg    Physical Exam General: Alert and oriented x 3, NAD Cardiovascular: Irregularly irregular Respiratory:  Fairly CTA B Gastrointestinal: Soft, nontender, nondistended, NBS Ext: 1+ pedal edema bilaterally      Data Reviewed:  I have personally reviewed following labs and imaging studies  Micro Results Recent Results (from the past 240 hour(s))  Resp Panel by RT-PCR (Flu A&B, Covid) Nasopharyngeal Swab     Status: Abnormal   Collection Time: 01/01/21  2:34 PM   Specimen: Nasopharyngeal Swab; Nasopharyngeal(NP) swabs in vial transport medium  Result Value Ref  Range Status   SARS Coronavirus 2 by RT PCR POSITIVE (A) NEGATIVE Final    Comment: RESULT CALLED TO, READ BACK BY AND VERIFIED WITH: RN J.COOK ON [id number] AT 1650 BY E.PARRISH (NOTE) SARS-CoV-2 target nucleic acids are DETECTED.  The SARS-CoV-2 RNA is generally detectable in upper respiratory specimens during the acute phase of infection. Positive results are indicative of the presence of the identified virus, but do not rule out bacterial infection or co-infection with other pathogens not detected by the test. Clinical correlation with patient history and other diagnostic information is necessary to determine patient infection status. The expected result is Negative.  Fact Sheet for Patients: EntrepreneurPulse.com.au  Fact Sheet for Healthcare Providers: IncredibleEmployment.be  This test is not yet approved or cleared by the [place] FDA and  has been authorized for detection and/or diagnosis of SARS-CoV-2 by FDA under an Emergency Use Authorization (EUA).  This EUA will remain in effect (meaning this te st can be used) for the duration of  the COVID-19 declaration under Section 564(b)(1) of the Act, 21 U.S.C. section 360bbb-3(b)(1), unless the authorization is terminated or revoked sooner.     Influenza A by PCR NEGATIVE NEGATIVE Final   Influenza B by PCR NEGATIVE NEGATIVE Final    Comment: (NOTE) The Xpert Xpress SARS-CoV-2/FLU/RSV plus assay is intended as an aid in the  diagnosis of influenza from Nasopharyngeal swab specimens and should not be used as a sole basis for treatment. Nasal washings and aspirates are unacceptable for Xpert Xpress SARS-CoV-2/FLU/RSV testing.  Fact Sheet for Patients: EntrepreneurPulse.com.au  Fact Sheet for Healthcare Providers: IncredibleEmployment.be  This test is not yet approved or cleared by the [place] FDA and has been authorized for detection and/or diagnosis of SARS-CoV-2 by FDA under an Emergency Use Authorization (EUA). This EUA will remain in effect (meaning this test can be used) for the duration of the COVID-19 declaration under Section 564(b)(1) of the Act, 21 U.S.C. section 360bbb-3(b)(1), unless the authorization is terminated or revoked.  Performed at [facility], [place] [address], [place], [place] [address]     Radiology Reports CT Head Wo Contrast  Result Date: 01/01/2021 CLINICAL DATA:  Head trauma, mod-severe fall, hematoma to crown of head EXAM: CT HEAD WITHOUT CONTRAST TECHNIQUE: Contiguous axial images were obtained from the base of the skull through the vertex without intravenous contrast. COMPARISON:  12/26/2020 FINDINGS: Brain: There is atrophy and chronic small vessel disease changes. No acute intracranial abnormality. Specifically, no hemorrhage, hydrocephalus, mass lesion, acute infarction, or significant intracranial injury. Vascular: No hyperdense vessel or unexpected calcification. Skull: No acute calvarial abnormality. Sinuses/Orbits: No acute findings Other: None IMPRESSION: Atrophy, chronic microvascular disease. No acute intracranial abnormality. Electronically Signed   By: Rolm Baptise M.D.   On: 01/01/2021 16:43   CT HEAD WO CONTRAST (5MM)  Result Date: 12/26/2020 CLINICAL DATA:  Head and neck trauma EXAM: CT HEAD WITHOUT CONTRAST TECHNIQUE: Contiguous axial images were obtained from the base of the skull through the vertex without  intravenous contrast. COMPARISON:  12/16/2009 FINDINGS: Brain: Progressive confluent hypodensities throughout the periventricular white matter consistent with chronic small vessel ischemic changes. No signs of acute infarct or hemorrhage. Lateral ventricles and midline structures are unremarkable. No acute extra-axial fluid collections. No mass effect. Vascular: Prominent atherosclerosis of the internal carotid arteries. No hyperdense vessel. Skull: Small right parietal scalp hematoma. No underlying fracture. Negative for fracture or focal lesion. Sinuses/Orbits: Mild mucosal thickening within the ethmoid and sphenoid sinuses. Other: None. IMPRESSION:  1. No acute intracranial process. 2. Small right parietal scalp hematoma. Electronically Signed   By: Randa Ngo M.D.   On: 12/26/2020 21:26   CT CERVICAL SPINE WO CONTRAST  Result Date: 12/26/2020 CLINICAL DATA:  Head and neck trauma EXAM: CT CERVICAL SPINE WITHOUT CONTRAST TECHNIQUE: Multidetector CT imaging of the cervical spine was performed without intravenous contrast. Multiplanar CT image reconstructions were also generated. COMPARISON:  None. FINDINGS: Alignment: Alignment is grossly anatomic. Skull base and vertebrae: No acute fracture. No primary bone lesion or focal pathologic process. Soft tissues and spinal canal: No prevertebral fluid or swelling. No visible canal hematoma. 2.3 x 2.0 x 2.5 cm hypodense nodule arises from the lower pole of the left lobe thyroid. Disc levels: Mild diffuse facet hypertrophy. Disc spaces are well preserved. Upper chest: Central airway is patent.  Lung apices are clear. Other: Reconstructed images demonstrate no additional findings. IMPRESSION: 1. No acute cervical spine fracture. 2. Incidental 2.5 cm left lobe thyroid nodule. Recommend nonemergent thyroid US if clinically warranted given patient age. Electronically Signed   By: Randa Ngo M.D.   On: 12/26/2020 21:31   DG Chest Port 1 View  Result Date:  01/04/2021 CLINICAL DATA:  S/P PICC central line placement EXAM: PORTABLE CHEST - 1 VIEW COMPARISON:  01/03/2019 FINDINGS: Right arm PICC line to the cavoatrial junction. Stable left subclavian AICD. Coarse airspace opacities at the left lung base, obscuring the left diaphragmatic leaflet. Heart size upper limits normal. Aortic Atherosclerosis (ICD10-170.0). CABG markers. Blunting of left lateral costophrenic angle suggesting small effusion. No pneumothorax. Right shoulder DJD. IMPRESSION: Right arm PICC line to the cavoatrial junction. Coarse left lower lobe airspace opacities. Electronically Signed   By: Lucrezia Europe M.D.   On: 01/04/2021 12:18   DG Chest Port 1 View  Result Date: 01/02/2021 CLINICAL DATA:  Reason for exam: sob Covid + Patient reports sob without any chest pains. Hx of afib, chf, diabetes, htn, prior MI 2005. Hx of defibrillator, cabg x3 2005. EXAM: PORTABLE CHEST - 1 VIEW COMPARISON:  01/01/2021 FINDINGS: Left subclavian AICD stable. Relatively low lung volumes with some crowding of perihilar and bibasilar bronchovascular structures. No confluent airspace disease. Stable borderline cardiomegaly. CABG markers. Aortic Atherosclerosis (ICD10-170.0). No effusion.  No pneumothorax. Sternotomy wires. Right shoulder DJD. Cholecystectomy clips. vertebral endplate spurring at multiple levels in the lower thoracic spine. IMPRESSION: Low volumes.  Stable chronic and postop changes as above. Electronically Signed   By: Lucrezia Europe M.D.   On: 01/02/2021 07:33   DG Chest Portable 1 View  Result Date: 01/01/2021 CLINICAL DATA:  Shortness of breath EXAM: PORTABLE CHEST 1 VIEW COMPARISON:  12/27/2020 FINDINGS: Left AICD remains in place, unchanged. Prior CABG. Cardiomegaly, vascular congestion. No overt edema. No confluent opacities or effusions. IMPRESSION: Cardiomegaly, vascular congestion. Electronically Signed   By: Rolm Baptise M.D.   On: 01/01/2021 15:46   DG CHEST PORT 1 VIEW  Result Date:  12/27/2020 CLINICAL DATA:  Shortness of breath, COVID EXAM: PORTABLE CHEST 1 VIEW COMPARISON:  12/25/2020 FINDINGS: Cardiomegaly with left chest multi lead pacer defibrillator. Prominent pulmonary vasculature without acute airspace opacity. Possible trace pleural effusions and or pleural thickening. IMPRESSION: Cardiomegaly and pulmonary vascular congestion. No acute airspace opacity. Possible trace pleural effusions and or pleural thickening. Electronically Signed   By: Delanna Ahmadi M.D.   On: 12/27/2020 10:51   DG Chest Portable 1 View  Result Date: 12/25/2020 CLINICAL DATA:  COVID positive. Shortness of breath. EXAM: PORTABLE CHEST 1 VIEW  COMPARISON:  January 2020 FINDINGS: Cardiac pacemaker and postsurgical changes are stable. Enlarged cardiac silhouette. Calcific atherosclerotic disease and tortuosity of the aorta. Subtle patchy areas of airspace consolidation in the lung bases. Osseous structures are without acute abnormality. Soft tissues are grossly normal. IMPRESSION: Subtle patchy areas of airspace consolidation in the lung bases may represent early atypical pneumonia. Electronically Signed   By: Fidela Salisbury M.D.   On: 12/25/2020 18:09   VAS [place] LOWER EXTREMITY VENOUS (DVT)  Result Date: 12/27/2020  Lower Venous DVT Study Patient Name:  SANTO ZAHRADNIK  Date of Exam:   12/27/2020 Medical Rec #: [id number]        Accession #:    [id number] Date of Birth: 25-Nov-1928        Patient Gender: M Patient Age:   48 years Exam Location:  [facility] Procedure:      VAS [place] LOWER EXTREMITY VENOUS (DVT) Referring Phys: A POWELL JR --------------------------------------------------------------------------------  Indications: Left > right LE edema.  Risk Factors: COVID-19 +. Comparison Study: No prior study Performing Technologist: Maudry Mayhew MHA, RDMS, RVT, RDCS  Examination Guidelines: A complete evaluation includes B-mode imaging, spectral Doppler, color Doppler, and power Doppler as  needed of all accessible portions of each vessel. Bilateral testing is considered an integral part of a complete examination. Limited examinations for reoccurring indications may be performed as noted. The reflux portion of the exam is performed with the patient in reverse Trendelenburg.  +-----+---------------+---------+-----------+----------+--------------+ RIGHTCompressibilityPhasicitySpontaneityPropertiesThrombus Aging +-----+---------------+---------+-----------+----------+--------------+ CFV  Full           Yes      Yes                                 +-----+---------------+---------+-----------+----------+--------------+   +---------+---------------+---------+-----------+----------+--------------+ LEFT     CompressibilityPhasicitySpontaneityPropertiesThrombus Aging +---------+---------------+---------+-----------+----------+--------------+ CFV      Full           Yes      Yes                                 +---------+---------------+---------+-----------+----------+--------------+ SFJ      Full                                                        +---------+---------------+---------+-----------+----------+--------------+ FV Prox  Full                                                        +---------+---------------+---------+-----------+----------+--------------+ FV Mid   Full                                                        +---------+---------------+---------+-----------+----------+--------------+ FV DistalFull                                                        +---------+---------------+---------+-----------+----------+--------------+  PFV      Full                                                        +---------+---------------+---------+-----------+----------+--------------+ POP      Full           Yes      Yes                                 +---------+---------------+---------+-----------+----------+--------------+ PTV       Full                                                        +---------+---------------+---------+-----------+----------+--------------+ PERO     Full                                                        +---------+---------------+---------+-----------+----------+--------------+ Gastroc  Full                    Yes                                 +---------+---------------+---------+-----------+----------+--------------+    Summary: RIGHT: - No evidence of common femoral vein obstruction.  LEFT: - There is no evidence of deep vein thrombosis in the lower extremity.  - No cystic structure found in the popliteal fossa.  *See table(s) above for measurements and observations. Electronically signed by Servando Snare MD on 12/27/2020 at 1:44:21 PM.    Final    [place] EKG SITE RITE  Result Date: 01/04/2021 If Site Rite image not attached, placement could not be confirmed due to current cardiac rhythm.   Lab Data:  CBC: Recent Labs  Lab 01/01/21 1439 01/02/21 0439 01/03/21 0403 01/04/21 0411 01/05/21 0350 01/06/21 1109  WBC 13.6* 15.1* 14.9* 12.9* 9.8 8.9  NEUTROABS 11.6*  --  12.5* 11.0* 8.1* 6.5  HGB 9.1* 9.0* 9.4* 9.9* 9.0* 8.4*  HCT 30.9* 32.1* 32.2* 33.3* 30.3* 28.4*  MCV 86.1 87.9 85.9 84.3 83.7 84.0  PLT 220 221 230 247 253 308   Basic Metabolic Panel: Recent Labs  Lab 01/02/21 0439 01/02/21 0808 01/03/21 0403 01/04/21 0411 01/05/21 0350 01/06/21 0358  NA 138  --  139 130* 136 133*  K 3.8  --  4.2 3.9 4.1 3.5  CL 103  --  104 96* 100 97*  CO2 25  --  26 26 25 25   GLUCOSE 215*  --  94 198* 222* 231*  BUN 67*  --  64* 66* 76* 79*  CREATININE 2.42*  --  2.23* 2.45* 2.85* 2.71*  CALCIUM 8.5*  --  8.5* 8.2* 8.4* 8.1*  MG  --  2.3 2.0 2.0 2.0 1.7   GFR: Estimated Creatinine Clearance: 20.1 mL/min (A) (by C-G formula based on SCr of 2.71 mg/dL (H)). Liver Function Tests: Recent Labs  Lab 01/03/21  0403 01/04/21 0411 01/05/21 0350 01/06/21 0358  AST 14*  15 15 13*  ALT 24 22 19 16   ALKPHOS 79 91 90 77  BILITOT 0.8 0.5 0.5 0.7  PROT 5.2* 5.4* 5.1* 4.8*  ALBUMIN 2.4* 2.5* 2.3* 2.1*   No results for input(s): LIPASE, AMYLASE in the last 168 hours. No results for input(s): AMMONIA in the last 168 hours. Coagulation Profile: No results for input(s): INR, PROTIME in the last 168 hours. Cardiac Enzymes: No results for input(s): CKTOTAL, CKMB, CKMBINDEX, TROPONINI in the last 168 hours. BNP (last 3 results) No results for input(s): PROBNP in the last 8760 hours. HbA1C: No results for input(s): HGBA1C in the last 72 hours. CBG: Recent Labs  Lab 01/05/21 1707 01/05/21 2105 01/06/21 0534 01/06/21 1255 01/06/21 1333  GLUCAP 181* 225* 159* 59* 156*   Lipid Profile: No results for input(s): CHOL, HDL, LDLCALC, TRIG, CHOLHDL, LDLDIRECT in the last 72 hours. Thyroid Function Tests: No results for input(s): TSH, T4TOTAL, FREET4, T3FREE, THYROIDAB in the last 72 hours. Anemia Panel: No results for input(s): VITAMINB12, FOLATE, FERRITIN, TIBC, IRON, RETICCTPCT in the last 72 hours. Urine analysis:    Component Value Date/Time   COLORURINE YELLOW 01/01/2021 2046   APPEARANCEUR CLEAR 01/01/2021 2046   LABSPEC 1.010 01/01/2021 2046   PHURINE 5.0 01/01/2021 2046   GLUCOSEU >=500 (A) 01/01/2021 2046   HGBUR NEGATIVE 01/01/2021 2046   HGBUR negative 07/14/2008 0852   BILIRUBINUR NEGATIVE 01/01/2021 2046   BILIRUBINUR n 08/15/2016 [place] 01/01/2021 2046   PROTEINUR NEGATIVE 01/01/2021 2046   UROBILINOGEN 0.2 08/15/2016 1322   UROBILINOGEN 0.2 04/29/2014 1006   NITRITE NEGATIVE 01/01/2021 2046   LEUKOCYTESUR NEGATIVE 01/01/2021 2046     Angelmarie Ponzo M.D. [facility] 01/06/2021, 3:26 PM  Available via Epic secure chat 7am-7pm After 7 pm, please refer to night coverage provider listed on amion.

## 2021-01-06 NOTE — Progress Notes (Signed)
PT Cancellation Note  Patient Details Name: [name] MRN: [id number] DOB: 29-Oct-1928   Cancelled Treatment:    Reason Eval/Treat Not Completed: Patient at procedure or test/unavailable. Pt off floor at procedure, likely for the planned DCCV. Will plan to follow-up as time permits.   Moishe Spice, PT, DPT Acute Rehabilitation Services  Pager: [phone] Office: [place] 01/06/2021, 12:46 PM

## 2021-01-06 NOTE — Procedures (Signed)
Electrical Cardioversion Procedure Note [name] [id number] 06-16-1928  Procedure: Electrical Cardioversion Indications:  Atrial Fibrillation  Procedure Details Consent: Risks of procedure as well as the alternatives and risks of each were explained to the (patient/caregiver).  Consent for procedure obtained. Time Out: Verified patient identification, verified procedure, site/side was marked, verified correct patient position, special equipment/implants available, medications/allergies/relevent history reviewed, required imaging and test results available.  Performed  Patient placed on cardiac monitor, pulse oximetry, supplemental oxygen as necessary.  Sedation given:  Propofol per anesthesiology Pacer pads placed anterior and posterior chest.  Cardioverted 1 time(s).  Cardioverted at [place].  Evaluation Findings: Post procedure EKG shows: NSR Complications: None Patient did tolerate procedure well.   [name] 01/06/2021, 1:11 PM

## 2021-01-06 NOTE — Interval H&P Note (Signed)
History and Physical Interval Note:  01/06/2021 1:03 PM  [name]  has presented today for surgery, with the diagnosis of atrial fibrillation.  The various methods of treatment have been discussed with the patient and family. After consideration of risks, benefits and other options for treatment, the patient has consented to  Procedure(s): CARDIOVERSION (N/A) as a surgical intervention.  The patient's history has been reviewed, patient examined, no change in status, stable for surgery.  I have reviewed the patient's chart and labs.  Questions were answered to the patient's satisfaction.     Robbi Spells [employer]

## 2021-01-06 NOTE — Progress Notes (Addendum)
Patient ID: [name], male   DOB: May 13, 1928, 87 y.o.   MRN: [id number]     Advanced Heart Failure Rounding Note  PCP-Cardiologist: None   Subjective:    Feels better, breathing back to normal.  Walked to door of room yesterday.   Co-ox 65% on milrinone 0.375.  CVP 8 on my measure this morning.  He remains in rate-controlled EF.  Creatinine 2.85 => 2.71.      Objective:   Weight Range: 78.4 kg Body mass index is 22.19 kg/m.   Vital Signs:   Temp:  [97.5 F (36.4 C)-97.9 F (36.6 C)] 97.5 F (36.4 C) (10/21 0405) Pulse Rate:  [76-88] 88 (10/20 2104) Resp:  [12-20] 12 (10/21 0405) BP: (112-131)/(65-87) 112/77 (10/21 0405) SpO2:  [96 %-100 %] 98 % (10/21 0405) Weight:  [78.4 kg] 78.4 kg (10/21 0405) Last BM Date: 01/04/21  Weight change: Filed Weights   01/04/21 0632 01/05/21 0400 01/06/21 0405  Weight: 77.8 kg 77.2 kg 78.4 kg    Intake/Output:   Intake/Output Summary (Last 24 hours) at 01/06/2021 0832 Last data filed at 01/06/2021 0608 Gross per 24 hour  Intake 1940.04 ml  Output 776 ml  Net 1164.04 ml      Physical Exam    General: NAD Neck: JVP 8 cm, no thyromegaly or thyroid nodule.  Lungs: Clear to auscultation bilaterally with normal respiratory effort. CV: Nondisplaced PMI.  Heart irregular S1/S2, no S3/S4, 2/6 early SEM RUSB.  1+ ankle edema.  Abdomen: Soft, nontender, no hepatosplenomegaly, no distention.  Skin: Intact without lesions or rashes.  Neurologic: Alert and oriented x 3.  Psych: Normal affect. Extremities: No clubbing or cyanosis.  HEENT: Normal.    Telemetry   Atrial fibrillation 80s (personally reviewed)  Labs    CBC Recent Labs    01/04/21 0411 01/05/21 0350  WBC 12.9* 9.8  NEUTROABS 11.0* 8.1*  HGB 9.9* 9.0*  HCT 33.3* 30.3*  MCV 84.3 83.7  PLT 247 409   Basic Metabolic Panel Recent Labs    01/05/21 0350 01/06/21 0358  NA 136 133*  K 4.1 3.5  CL 100 97*  CO2 25 25  GLUCOSE 222* 231*  BUN 76* 79*   CREATININE 2.85* 2.71*  CALCIUM 8.4* 8.1*  MG 2.0 1.7   Liver Function Tests Recent Labs    01/05/21 0350 01/06/21 0358  AST 15 13*  ALT 19 16  ALKPHOS 90 77  BILITOT 0.5 0.7  PROT 5.1* 4.8*  ALBUMIN 2.3* 2.1*   No results for input(s): LIPASE, AMYLASE in the last 72 hours. Cardiac Enzymes No results for input(s): CKTOTAL, CKMB, CKMBINDEX, TROPONINI in the last 72 hours.  BNP: BNP (last 3 results) Recent Labs    01/03/21 0403 01/04/21 0411 01/05/21 0350  BNP 2,227.4* 2,990.9* 1,925.1*    ProBNP (last 3 results) No results for input(s): PROBNP in the last 8760 hours.   D-Dimer No results for input(s): DDIMER in the last 72 hours. Hemoglobin A1C No results for input(s): HGBA1C in the last 72 hours. Fasting Lipid Panel No results for input(s): CHOL, HDL, LDLCALC, TRIG, CHOLHDL, LDLDIRECT in the last 72 hours. Thyroid Function Tests No results for input(s): TSH, T4TOTAL, T3FREE, THYROIDAB in the last 72 hours.  Invalid input(s): FREET3  Other results:   Imaging    No results found.   Medications:     Scheduled Medications:  allopurinol  100 mg Oral Daily   apixaban  2.5 mg Oral BID   atorvastatin  40  mg Oral Daily   Chlorhexidine Gluconate Cloth  6 each Topical Daily   dapagliflozin propanediol  10 mg Oral QAC breakfast   doxycycline  100 mg Oral Q12H   escitalopram  10 mg Oral Daily   gabapentin  100 mg Oral BID   guaiFENesin  600 mg Oral BID   insulin aspart  0-9 Units Subcutaneous TID WC   insulin glargine-yfgn  30 Units Subcutaneous QHS   isosorbide mononitrate  15 mg Oral Daily   loratadine  10 mg Oral Daily   magnesium sulfate  1 g Intravenous Once   potassium chloride  40 mEq Oral Once   pramipexole  0.25 mg Oral Q24H   sodium chloride flush  10-40 mL Intracatheter Q12H   sodium chloride flush  3 mL Intravenous Q12H   spironolactone  25 mg Oral Daily   tamsulosin  0.4 mg Oral QPC breakfast   torsemide  20 mg Oral Daily   vitamin B-12   1,000 mcg Oral Daily    Infusions:  sodium chloride     amiodarone 30 mg/hr (01/06/21 0529)   milrinone 0.375 mcg/kg/min (01/06/21 0529)    PRN Medications: sodium chloride, acetaminophen **OR** acetaminophen, lidocaine, sodium chloride flush, sodium chloride flush    Assessment/Plan   1. Acute on chronic systolic CHF: Ischemic cardiomyopathy.  Echo in 5/22 with EF 20-25%, low gradient moderate AS with mean gradient 12 mmHg and AVA 1.1. St Jude CRT-D device.  CHF exacerbation in setting of recurrent atrial fibrillation and loss of effective CRT.  Creatinine now trending back down,  2.2 => 2.45 => 2.85 => 2.71.  He is now on milrinone 0.375 with low co-ox, co-ox up to 65% this morning.  CVP down to 8, breathing is better overall.  Off IV Lasix.  - Decreased milrinone to 0.25 now, will drop to 0.125 pre-DCCV this afternoon.  - Continue home torsemide 20 mg daily.   - Hold digoxin until creatinine trends down.   - Continue Farxiga 10 mg daily.  - Continue spironolactone 25 daily.  - Hold Coreg and hydral/Imdur for now, SBP stable 100s-110s.  - Needs to regain NSR, see below.  2. Atrial fibrillation: Paroxysmal.  Went into AF on 10/9, likely triggered by COVID-19 infection.  Loss of effective CRT with AF.   - Amiodarone gtt while on milrinone.   - On Eliquis 2.5 bid and has not missed doses.  - Aim for DCCV at 1 pm today.  Discussed risks/benefits with patient, he agrees to procedure.    3. AKI on CKD stage 4: Baseline creatinine around 2.  Creatinine 2.85 => 2.71 today.  4. COVID-19: Admitted initially 10/8 with COVID-19. Received monoclonal ab and remdesivir initially. Still with cough though CHF likely contributes to this.  - Now off precautions.  5. CAD: s/p CABG.  No chest pain.  HS-TnI minimally elevated with no trend, likely demand ischemia.  - Continue atorvastatin. - Not on ASA with stable CAD and apixaban use.   Mobilize  Length of Stay: [place], MD   01/06/2021, 8:32 AM  Advanced Heart Failure Team Pager [phone] (M-F; 7a - 5p)  Please contact [facility] for night-coverage after hours (5p -7a ) and weekends on [email or web address]

## 2021-01-06 NOTE — Plan of Care (Signed)

## 2021-01-06 NOTE — Progress Notes (Signed)
Inpatient Diabetes Program Recommendations  AACE/ADA: New Consensus Statement on Inpatient Glycemic Control (2015)  Target Ranges:  Prepandial:   less than 140 mg/dL      Peak postprandial:   less than 180 mg/dL (1-2 hours)      Critically ill patients:  140 - 180 mg/dL  Results for [name], [name] (MRN [id number]) as of 01/06/2021 13:32  Ref. Range 01/06/2021 05:34 01/06/2021 12:55  Glucose-Capillary Latest Ref Range: 70 - 99 mg/dL 159 (H)  2 units Novolog  59 (L)    Home DM Meds: Farxiga 10 mg daily       Lantus 30 units QHS   Current Orders: Semglee 30 units QHS     Novolog Sensitive Correction Scale/ SSI (0-9 units) TID AC     Farxiga 10 mg daily    MD- Note Hypoglycemia at 1pm today (likely due to NPO status this AM)  If pt has any more episodes of Hypoglycemia, please consider reducing Semglee to 25 units QHS     --Will follow patient during hospitalization--  Wyn Quaker RN, MSN, CDE Diabetes Coordinator Inpatient Glycemic Control Team Team Pager: [phone] (8a-5p)

## 2021-01-06 NOTE — Progress Notes (Signed)
Physical Therapy Treatment Patient Details Name: [name] MRN: [id number] DOB: 1929/01/02 Today's Date: 01/06/2021   History of Present Illness 87 y.o. [name] was admitted on 10/16 for a fall at home with pt hitting his head but cleared for fractures.  Had recent admission for positive covid test, still testing positive and SOB.  Has PVC's on telemetry, CHF exacerbation, LE edema. S/p DCCV 10/21. PMHx:  CAD s/p CABG, CKD stage IIIb, DM2, ischemic cardiomyopathy with systolic and diastolic congestive heart failure, moderate aortic stenosis, paroxysmal atrial fibrillation, HTN, gout.    PT Comments    Pt was able to ambulate an increased distance of up to ~260 ft with a RW and supervision without LOB, even when provided dynamic gait challenges. However, he continues to fatigue and require extra time for all tasks due to deficits in endurance and strength. VSS on 1L of O2 while ambulating. Encouraged pt to mobilize with nursing. Will continue to follow acutely. Current recommendations remain appropriate.    Recommendations for follow up therapy are one component of a multi-disciplinary discharge planning process, led by the attending physician.  Recommendations may be updated based on patient status, additional functional criteria and insurance authorization.  Follow Up Recommendations  Home health PT     Equipment Recommendations  None recommended by PT    Recommendations for Other Services       Precautions / Restrictions Precautions Precautions: Fall Precaution Comments: monitor O2 and HR Restrictions Weight Bearing Restrictions: No     Mobility  Bed Mobility Overal bed mobility: Modified Independent Bed Mobility: Supine to Sit;Sit to Supine     Supine to sit: Modified independent (Device/Increase time);HOB elevated Sit to supine: Modified independent (Device/Increase time);HOB elevated   General bed mobility comments: use of bed rails, HOB elevated, increased time     Transfers Overall transfer level: Needs assistance Equipment used: Rolling walker (2 wheeled) Transfers: Sit to/from Stand Sit to Stand: Supervision         General transfer comment: Supervision for safety, no overt LOB or safety concerns.  Ambulation/Gait Ambulation/Gait assistance: Supervision Gait Distance (Feet): 260 Feet Assistive device: Rolling walker (2 wheeled) Gait Pattern/deviations: Step-through pattern;Decreased stride length;Trunk flexed Gait velocity: decreased Gait velocity interpretation: <1.8 ft/sec, indicate of risk for recurrent falls General Gait Details: Pt with kyphotic posture and downward gaze. Cues provided to lower shoulders, adduct scapulas, and change head positions while ambulating. Pt able to change speeds and head positions without LOB, supervision for safety.   Stairs             Wheelchair Mobility    Modified Rankin (Stroke Patients Only)       Balance Overall balance assessment: Needs assistance;History of Falls Sitting-balance support: Feet supported;No upper extremity supported Sitting balance-Leahy Scale: Good     Standing balance support: Bilateral upper extremity supported;During functional activity Standing balance-Leahy Scale: Poor Standing balance comment: Ambulates with bil UE support, no LOB with dynamic gait chellenges.                            Cognition Arousal/Alertness: Awake/alert Behavior During Therapy: WFL for tasks assessed/performed Overall Cognitive Status: Within Functional Limits for tasks assessed                                        Exercises      General Comments  Pertinent Vitals/Pain Pain Assessment: No/denies pain    Home Living                      Prior Function            PT Goals (current goals can now be found in the care plan section) Acute Rehab PT Goals Patient Stated Goal: to go home PT Goal Formulation: With patient Time  For Goal Achievement: 01/16/21 Potential to Achieve Goals: Good Progress towards PT goals: Progressing toward goals    Frequency    Min 3X/week      PT Plan Current plan remains appropriate    Co-evaluation              AM-PAC PT "6 Clicks" Mobility   Outcome Measure  Help needed turning from your back to your side while in a flat bed without using bedrails?: None Help needed moving from lying on your back to sitting on the side of a flat bed without using bedrails?: None Help needed moving to and from a bed to a chair (including a wheelchair)?: A Little Help needed standing up from a chair using your arms (e.g., wheelchair or bedside chair)?: A Little Help needed to walk in hospital room?: A Little Help needed climbing 3-5 steps with a railing? : A Little 6 Click Score: 20    End of Session   Activity Tolerance: Patient tolerated treatment well Patient left: with call bell/phone within reach;in bed;with bed alarm set   PT Visit Diagnosis: Muscle weakness (generalized) (M62.81);History of falling (Z91.81);Other (comment) (cardiac arrhythmias)     Time: 1423-9532 PT Time Calculation (min) (ACUTE ONLY): 22 min  Charges:  $Gait Training: 8-22 mins                     Moishe Spice, PT, DPT Acute Rehabilitation Services  Pager: [phone] Office: [place] 01/06/2021, 5:07 PM

## 2021-01-06 NOTE — Anesthesia Postprocedure Evaluation (Signed)
Anesthesia Post Note  Patient: [name]  Procedure(s) Performed: CARDIOVERSION     Patient location during evaluation: Endoscopy Anesthesia Type: General Level of consciousness: awake Pain management: pain level controlled Vital Signs Assessment: post-procedure vital signs reviewed and stable Respiratory status: spontaneous breathing Cardiovascular status: stable Postop Assessment: no apparent nausea or vomiting Anesthetic complications: no   No notable events documented.  Last Vitals:  Vitals:   01/06/21 1316 01/06/21 1327  BP: (!) 106/52 127/69  Pulse: 76 85  Resp: 12 16  Temp: 36.6 C   SpO2: 100% 98%    Last Pain:  Vitals:   01/06/21 1327  TempSrc:   PainSc: 0-No pain                 Teasia Zapf

## 2021-01-06 NOTE — Anesthesia Preprocedure Evaluation (Signed)
Anesthesia Evaluation  Patient identified by MRN, date of birth, ID band Patient awake    Reviewed: Allergy & Precautions, NPO status , Patient's Chart, lab work & pertinent test results  Airway Mallampati: II  TM Distance: >3 FB     Dental   Pulmonary shortness of breath, sleep apnea , pneumonia, former smoker,    breath sounds clear to auscultation       Cardiovascular hypertension, + CAD, + Past MI, + Peripheral Vascular Disease and +CHF  + Cardiac Defibrillator  Rhythm:Regular Rate:Normal     Neuro/Psych PSYCHIATRIC DISORDERS  Neuromuscular disease    GI/Hepatic negative GI ROS, Neg liver ROS,   Endo/Other  diabetes  Renal/GU Renal disease     Musculoskeletal  (+) Arthritis ,   Abdominal   Peds  Hematology   Anesthesia Other Findings   Reproductive/Obstetrics                             Anesthesia Physical Anesthesia Plan  ASA: 3  Anesthesia Plan: General   Post-op Pain Management:    Induction: Intravenous  PONV Risk Score and Plan: 2 and Treatment may vary due to age or medical condition  Airway Management Planned: Nasal Cannula and Simple Face Mask  Additional Equipment:   Intra-op Plan:   Post-operative Plan:   Informed Consent: I have reviewed the patients History and Physical, chart, labs and discussed the procedure including the risks, benefits and alternatives for the proposed anesthesia with the patient or authorized representative who has indicated his/her understanding and acceptance.       Plan Discussed with: CRNA and Anesthesiologist  Anesthesia Plan Comments:         Anesthesia Quick Evaluation

## 2021-01-07 ENCOUNTER — Other Ambulatory Visit (HOSPITAL_COMMUNITY): Payer: Self-pay | Admitting: Cardiology

## 2021-01-07 ENCOUNTER — Inpatient Hospital Stay (HOSPITAL_COMMUNITY): Payer: No Typology Code available for payment source

## 2021-01-07 DIAGNOSIS — I5023 Acute on chronic systolic (congestive) heart failure: Secondary | ICD-10-CM | POA: Diagnosis not present

## 2021-01-07 DIAGNOSIS — I2510 Atherosclerotic heart disease of native coronary artery without angina pectoris: Secondary | ICD-10-CM | POA: Diagnosis not present

## 2021-01-07 DIAGNOSIS — N170 Acute kidney failure with tubular necrosis: Secondary | ICD-10-CM

## 2021-01-07 DIAGNOSIS — I480 Paroxysmal atrial fibrillation: Secondary | ICD-10-CM | POA: Diagnosis not present

## 2021-01-07 LAB — GLUCOSE, CAPILLARY
Glucose-Capillary: 143 mg/dL — ABNORMAL HIGH (ref 70–99)
Glucose-Capillary: 161 mg/dL — ABNORMAL HIGH (ref 70–99)
Glucose-Capillary: 163 mg/dL — ABNORMAL HIGH (ref 70–99)
Glucose-Capillary: 120 mg/dL — ABNORMAL HIGH (ref 70–99)

## 2021-01-07 LAB — BASIC METABOLIC PANEL
Anion gap: 8 (ref 5–15)
BUN: 76 mg/dL — ABNORMAL HIGH (ref 8–23)
CO2: 26 mmol/L (ref 22–32)
Calcium: 8.4 mg/dL — ABNORMAL LOW (ref 8.9–10.3)
Chloride: 100 mmol/L (ref 98–111)
Creatinine, Ser: 2.27 mg/dL — ABNORMAL HIGH (ref 0.61–1.24)
GFR, Estimated: 27 mL/min — ABNORMAL LOW (ref >60)
Glucose, Bld: 203 mg/dL — ABNORMAL HIGH (ref 70–99)
Potassium: 4 mmol/L (ref 3.5–5.1)
Sodium: 134 mmol/L — ABNORMAL LOW (ref 135–145)

## 2021-01-07 LAB — COOXEMETRY PANEL
Carboxyhemoglobin: 0 % — ABNORMAL LOW (ref 0.5–1.5)
Methemoglobin: 0.5 % (ref 0.0–1.5)
O2 Saturation: 66.6 %
Total hemoglobin: 8.6 g/dL — ABNORMAL LOW (ref 12.0–16.0)

## 2021-01-07 MED ORDER — TORSEMIDE 20 MG PO TABS
40.0000 mg | ORAL_TABLET | Freq: Every day | ORAL | Status: DC
  Administered 2021-01-08 – 2021-01-11 (×4): 40 mg via ORAL
  Filled 2021-01-07 (×5): qty 2

## 2021-01-07 NOTE — Progress Notes (Signed)
Patient ID: [name], male   DOB: [birth year], 87 y.o.   MRN: [id number]     Advanced Heart Failure Rounding Note  PCP-Cardiologist: None   Subjective:    Underwent DC-CV yesterday. Remains in NSR.   Feels good. Denies SOB, orthopnea or PND.   On milrinone 0.125. Co-ox 67% CVP 10 Now on po torsemide weight up 1 pound    Objective:   Weight Range: 78.8 kg Body mass index is 22.3 kg/m.   Vital Signs:   Temp:  [96.6 F (35.9 C)-97.8 F (36.6 C)] 97.6 F (36.4 C) (10/22 0518) Pulse Rate:  [73-85] 73 (10/22 0800) Resp:  [12-20] 18 (10/22 0518) BP: (106-132)/(49-69) 114/61 (10/22 0800) SpO2:  [98 %-100 %] 100 % (10/22 0518) Weight:  [78.8 kg] 78.8 kg (10/22 0221) Last BM Date: 01/04/21  Weight change: Filed Weights   01/05/21 0400 01/06/21 0405 01/07/21 0221  Weight: 77.2 kg 78.4 kg 78.8 kg    Intake/Output:   Intake/Output Summary (Last 24 hours) at 01/07/2021 0914 Last data filed at 01/07/2021 0221 Gross per 24 hour  Intake 1420 ml  Output 1000 ml  Net 420 ml       Physical Exam    General:  Elderly male lying in bed  No resp difficulty HEENT: normal Neck: supple. JVP to jaw  Carotids 2+ bilat; no bruits. No lymphadenopathy or thryomegaly appreciated. Cor: PMI nondisplaced. Regular rate & rhythm. 2/6 AS. Lungs: clear Abdomen: soft, nontender, nondistended. No hepatosplenomegaly. No bruits or masses. Good bowel sounds. Extremities: no cyanosis, clubbing, rash, tr edema Neuro: alert & orientedx3, cranial nerves grossly intact. moves all 4 extremities w/o difficulty. Affect pleasant   Telemetry   NSR 70-80s Personally reviewed   Labs    CBC Recent Labs    01/05/21 0350 01/06/21 1109  WBC 9.8 8.9  NEUTROABS 8.1* 6.5  HGB 9.0* 8.4*  HCT 30.3* 28.4*  MCV 83.7 84.0  PLT 253 267    Basic Metabolic Panel Recent Labs    01/05/21 0350 01/06/21 0358 01/07/21 0441  NA 136 133* 134*  K 4.1 3.5 4.0  CL 100 97* 100  CO2 25 25 26   GLUCOSE  222* 231* 203*  BUN 76* 79* 76*  CREATININE 2.85* 2.71* 2.27*  CALCIUM 8.4* 8.1* 8.4*  MG 2.0 1.7  --     Liver Function Tests Recent Labs    01/05/21 0350 01/06/21 0358  AST 15 13*  ALT 19 16  ALKPHOS 90 77  BILITOT 0.5 0.7  PROT 5.1* 4.8*  ALBUMIN 2.3* 2.1*    No results for input(s): LIPASE, AMYLASE in the last 72 hours. Cardiac Enzymes No results for input(s): CKTOTAL, CKMB, CKMBINDEX, TROPONINI in the last 72 hours.  BNP: BNP (last 3 results) Recent Labs    01/04/21 0411 01/05/21 0350 01/06/21 1109  BNP 2,990.9* 1,925.1* 993.7*     ProBNP (last 3 results) No results for input(s): PROBNP in the last 8760 hours.   D-Dimer No results for input(s): DDIMER in the last 72 hours. Hemoglobin A1C No results for input(s): HGBA1C in the last 72 hours. Fasting Lipid Panel No results for input(s): CHOL, HDL, LDLCALC, TRIG, CHOLHDL, LDLDIRECT in the last 72 hours. Thyroid Function Tests No results for input(s): TSH, T4TOTAL, T3FREE, THYROIDAB in the last 72 hours.  Invalid input(s): FREET3  Other results:   Imaging    No results found.   Medications:     Scheduled Medications:  allopurinol  100 mg Oral Daily  apixaban  2.5 mg Oral BID   atorvastatin  40 mg Oral Daily   Chlorhexidine Gluconate Cloth  6 each Topical Daily   dapagliflozin propanediol  10 mg Oral QAC breakfast   escitalopram  10 mg Oral Daily   gabapentin  100 mg Oral BID   guaiFENesin  600 mg Oral BID   insulin aspart  0-9 Units Subcutaneous TID WC   insulin glargine-yfgn  30 Units Subcutaneous QHS   isosorbide mononitrate  15 mg Oral Daily   loratadine  10 mg Oral Daily   pramipexole  0.25 mg Oral Q24H   sodium chloride flush  10-40 mL Intracatheter Q12H   sodium chloride flush  3 mL Intravenous Q12H   spironolactone  25 mg Oral Daily   tamsulosin  0.4 mg Oral QPC breakfast   torsemide  20 mg Oral Daily   vitamin B-12  1,000 mcg Oral Daily    Infusions:  sodium chloride      amiodarone 30 mg/hr (01/07/21 0219)   milrinone 0.125 mcg/kg/min (01/06/21 1230)    PRN Medications: sodium chloride, acetaminophen **OR** acetaminophen, guaiFENesin-dextromethorphan, lidocaine, sodium chloride flush, sodium chloride flush    Assessment/Plan   1. Acute on chronic systolic CHF: Ischemic cardiomyopathy.  Echo in 5/22 with EF 20-25%, low gradient moderate AS with mean gradient 12 mmHg and AVA 1.1. St Jude CRT-D device.  CHF exacerbation in setting of recurrent atrial fibrillation and loss of effective CRT.  Creatinine now trending back down,  2.2 => 2.45 => 2.85 => 2.71 => 2.27.  He is now on milrinone 0.25 with co-ox 67%.  CVP 8-10,  - Stop milrinone - Increase torsemide to 40 mg daily.  (On 20mg  daily at home) - Hold digoxin until creatinine trends down further   - Continue Farxiga 10 mg daily.  - Continue spironolactone 25 daily.  - Hold Coreg and hydral/Imdur for now, SBP stable 100s-110s.  2. Atrial fibrillation: Paroxysmal.  Went into AF on 10/9, likely triggered by COVID-19 infection.  Loss of effective CRT with AF.  Back in NSR after DC-CV on 10/21 - Continue IV amio one more day - On Eliquis 2.5 bid and has not missed doses.   3. AKI on CKD stage 4: Baseline creatinine around 2.  Creatinine 2.85 => 2.71=> 2.27 today.  4. COVID-19: Admitted initially 10/8 with COVID-19. Received monoclonal ab and remdesivir initially. Still with cough though CHF likely contributes to this.  - Now off precautions.  5. CAD: s/p CABG.  No chest pain.  HS-TnI minimally elevated with no trend, likely demand ischemia. No s/s ischemia - Continue atorvastatin. - Not on ASA with stable CAD and apixaban use.   Mobilize  Length of Stay: [place], MD  01/07/2021, 9:14 AM  Advanced Heart Failure Team Pager [phone] (M-F; 7a - 5p)  Please contact [facility] for night-coverage after hours (5p -7a ) and weekends on [email or web address]

## 2021-01-07 NOTE — Progress Notes (Signed)
[facility]                                                                              Patient Demographics  [name], is a 87 y.o. male, DOB - 30-May-1928, ZOX:[id number]  Admit date - 01/01/2021   Admitting Physician Orma Flaming, MD  Outpatient Primary MD for the patient is Panosh, Standley Brooking, MD  Outpatient specialists:   LOS - 6  days   Medical records reviewed and are as summarized below:    Chief Complaint  Patient presents with   Shortness of Breath   Dizziness   Leg Swelling       Brief summary   Patient is a 87 y.o. 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  Recently discharged on 12/27/2020 for CHF exacerbation and has continued to become progressively more short of breath since this time, he developed shortness of breath along with some dry cough, work-up suggestive of bronchitis along with underlying CHF.  Patient was admitted for further work-up.   Assessment & Plan    Principal Problem: Acute respiratory failure with hypoxia  Acute on chronic systolic CHF (congestive heart failure) ([place]), ischemic cardiomyopathy -CHF team following, milrinone to be discontinued, patient was transitioned to torsemide, increasing to 40 mg daily -Continue Aldactone, Farxiga -Holding Coreg, hydralazine, Imdur.  Active Problems: Acute bronchitis -Patient was placed on Zithromax, Mucinex, flutter valve and supportive care -O2 sats 96% on room air -Chest x-ray today shows mild interstitial edema  Paroxysmal atrial fibrillation -On IV amiodarone, underwent DCCV -In normal sinus rhythm, continue Eliquis   Recent COVID-19 infection -Patient had tested positive on 12/24/2020 and had received monoclonal antibody infusion during the last  hospitalization -No acute issues, off isolation  Acute kidney injury superimposed on stage IIIb CKD -Baseline creatinine 2.15 -Creatinine improving, 2.2, close to baseline   Essential hypertension -H&H stable  Obstructive sleep apnea -Continue CPAP  Iron deficiency anemia H&H stable  Restless leg syndrome -Continue Mirapex  Diabetes mellitus type 2, IDDM uncontrolled with hyperglycemia, CKD 3B -Continue basal insulin, sliding scale insulin -Fasting CBG 143 this a.m.    Pressure injury, left arm anterior upper skin tear -Present on admission, wound care per nursing Pressure Injury 12/26/20 Arm Anterior;Left;Proximal;Upper skin tear (Active)  12/26/20 0106  Location: Arm  Location Orientation: Anterior;Left;Proximal;Upper  Staging:   Wound Description (Comments): skin tear  Present on Admission: Yes     Code Status: DNR DVT Prophylaxis:  Place TED hose Start: 01/02/21 1518 apixaban (ELIQUIS) tablet 2.5 mg Start: 01/01/21 2200 apixaban (ELIQUIS) tablet 2.5 mg   Level of Care: Level of care: Telemetry Cardiac Family Communication: Discussed all imaging results, lab results, explained to the patient    Disposition Plan:     Status is: Inpatient  Remains inpatient appropriate because: Once cleared by cardiology  Time Spent in minutes 25 minutes  Procedures:  Cardioversion on 10/21  Consultants:   Cardiology  Antimicrobials:  Anti-infectives (From admission, onward)    Start     Dose/Rate Route Frequency Ordered Stop   01/03/21 1000  azithromycin (ZITHROMAX) tablet 250 mg  Status:  Discontinued        250 mg Oral Daily 01/02/21 1049 01/02/21 1119   01/02/21 1130  doxycycline (VIBRA-TABS) tablet 100 mg        100 mg Oral Every 12 hours 01/02/21 1119 01/06/21 2034   01/02/21 1100  azithromycin (ZITHROMAX) tablet 500 mg  Status:  Discontinued        500 mg Oral  Once 01/02/21 1048 01/02/21 1119          Medications  Scheduled Meds:  allopurinol   100 mg Oral Daily   apixaban  2.5 mg Oral BID   atorvastatin  40 mg Oral Daily   Chlorhexidine Gluconate Cloth  6 each Topical Daily   dapagliflozin propanediol  10 mg Oral QAC breakfast   escitalopram  10 mg Oral Daily   gabapentin  100 mg Oral BID   guaiFENesin  600 mg Oral BID   insulin aspart  0-9 Units Subcutaneous TID WC   insulin glargine-yfgn  30 Units Subcutaneous QHS   isosorbide mononitrate  15 mg Oral Daily   loratadine  10 mg Oral Daily   pramipexole  0.25 mg Oral Q24H   sodium chloride flush  10-40 mL Intracatheter Q12H   spironolactone  25 mg Oral Daily   tamsulosin  0.4 mg Oral QPC breakfast   [START ON 01/08/2021] torsemide  40 mg Oral Daily   vitamin B-12  1,000 mcg Oral Daily   Continuous Infusions:  amiodarone 30 mg/hr (01/07/21 0219)   PRN Meds:.acetaminophen **OR** acetaminophen, guaiFENesin-dextromethorphan, lidocaine      Subjective:   [name] was seen and examined today.  No acute complaints, no worsening chest pain or shortness of breath.  No fevers or chills.  Has some coughing. Objective:   Vitals:   01/07/21 0800 01/07/21 1100 01/07/21 1159 01/07/21 1200  BP: 114/61 129/85  129/64  Pulse: 73   76  Resp:    18  Temp:    98.7 F (37.1 C)  TempSrc:    Oral  SpO2:   96% 96%  Weight:      Height:        Intake/Output Summary (Last 24 hours) at 01/07/2021 1315 Last data filed at 01/07/2021 1251 Gross per 24 hour  Intake 1320 ml  Output 1800 ml  Net -480 ml     Wt Readings from Last 3 Encounters:  01/07/21 78.8 kg  12/27/20 76.9 kg  11/17/20 77.5 kg   Physical Exam General: Alert and oriented x 3, NAD Cardiovascular: S1 S2 clear, RRR Respiratory: Scattered rhonchi bilaterally Gastrointestinal: Soft, nontender, nondistended, NBS Ext: trace pedal edema bilaterally     Data Reviewed:  I have personally reviewed following labs and imaging studies  Micro Results Recent Results (from the past 240 hour(s))  Resp Panel by  RT-PCR (Flu A&B, Covid) Nasopharyngeal Swab     Status: Abnormal   Collection Time: 01/01/21  2:34 PM   Specimen: Nasopharyngeal Swab; Nasopharyngeal(NP) swabs in vial transport medium  Result Value Ref Range Status   SARS Coronavirus 2 by RT PCR POSITIVE (A) NEGATIVE Final    Comment: RESULT CALLED TO, READ BACK BY AND VERIFIED WITH: RN J.COOK ON [id number] AT 1650 BY E.PARRISH (NOTE) SARS-CoV-2 target nucleic acids are DETECTED.  The SARS-CoV-2 RNA is generally detectable in upper respiratory specimens during the  acute phase of infection. Positive results are indicative of the presence of the identified virus, but do not rule out bacterial infection or co-infection with other pathogens not detected by the test. Clinical correlation with patient history and other diagnostic information is necessary to determine patient infection status. The expected result is Negative.  Fact Sheet for Patients: EntrepreneurPulse.com.au  Fact Sheet for Healthcare Providers: IncredibleEmployment.be  This test is not yet approved or cleared by the [place] FDA and  has been authorized for detection and/or diagnosis of SARS-CoV-2 by FDA under an Emergency Use Authorization (EUA).  This EUA will remain in effect (meaning this te st can be used) for the duration of  the COVID-19 declaration under Section 564(b)(1) of the Act, 21 U.S.C. section 360bbb-3(b)(1), unless the authorization is terminated or revoked sooner.     Influenza A by PCR NEGATIVE NEGATIVE Final   Influenza B by PCR NEGATIVE NEGATIVE Final    Comment: (NOTE) The Xpert Xpress SARS-CoV-2/FLU/RSV plus assay is intended as an aid in the diagnosis of influenza from Nasopharyngeal swab specimens and should not be used as a sole basis for treatment. Nasal washings and aspirates are unacceptable for Xpert Xpress SARS-CoV-2/FLU/RSV testing.  Fact Sheet for  Patients: EntrepreneurPulse.com.au  Fact Sheet for Healthcare Providers: IncredibleEmployment.be  This test is not yet approved or cleared by the [place] FDA and has been authorized for detection and/or diagnosis of SARS-CoV-2 by FDA under an Emergency Use Authorization (EUA). This EUA will remain in effect (meaning this test can be used) for the duration of the COVID-19 declaration under Section 564(b)(1) of the Act, 21 U.S.C. section 360bbb-3(b)(1), unless the authorization is terminated or revoked.  Performed at [facility], [place] [address], [place], [place] [address]     Radiology Reports CT Head Wo Contrast  Result Date: 01/01/2021 CLINICAL DATA:  Head trauma, mod-severe fall, hematoma to crown of head EXAM: CT HEAD WITHOUT CONTRAST TECHNIQUE: Contiguous axial images were obtained from the base of the skull through the vertex without intravenous contrast. COMPARISON:  12/26/2020 FINDINGS: Brain: There is atrophy and chronic small vessel disease changes. No acute intracranial abnormality. Specifically, no hemorrhage, hydrocephalus, mass lesion, acute infarction, or significant intracranial injury. Vascular: No hyperdense vessel or unexpected calcification. Skull: No acute calvarial abnormality. Sinuses/Orbits: No acute findings Other: None IMPRESSION: Atrophy, chronic microvascular disease. No acute intracranial abnormality. Electronically Signed   By: Rolm Baptise M.D.   On: 01/01/2021 16:43   CT HEAD WO CONTRAST (5MM)  Result Date: 12/26/2020 CLINICAL DATA:  Head and neck trauma EXAM: CT HEAD WITHOUT CONTRAST TECHNIQUE: Contiguous axial images were obtained from the base of the skull through the vertex without intravenous contrast. COMPARISON:  12/16/2009 FINDINGS: Brain: Progressive confluent hypodensities throughout the periventricular white matter consistent with chronic small vessel ischemic changes. No signs of acute infarct or  hemorrhage. Lateral ventricles and midline structures are unremarkable. No acute extra-axial fluid collections. No mass effect. Vascular: Prominent atherosclerosis of the internal carotid arteries. No hyperdense vessel. Skull: Small right parietal scalp hematoma. No underlying fracture. Negative for fracture or focal lesion. Sinuses/Orbits: Mild mucosal thickening within the ethmoid and sphenoid sinuses. Other: None. IMPRESSION: 1. No acute intracranial process. 2. Small right parietal scalp hematoma. Electronically Signed   By: Randa Ngo M.D.   On: 12/26/2020 21:26   CT CERVICAL SPINE WO CONTRAST  Result Date: 12/26/2020 CLINICAL DATA:  Head and neck trauma EXAM: CT CERVICAL SPINE WITHOUT CONTRAST TECHNIQUE: Multidetector CT imaging of the  cervical spine was performed without intravenous contrast. Multiplanar CT image reconstructions were also generated. COMPARISON:  None. FINDINGS: Alignment: Alignment is grossly anatomic. Skull base and vertebrae: No acute fracture. No primary bone lesion or focal pathologic process. Soft tissues and spinal canal: No prevertebral fluid or swelling. No visible canal hematoma. 2.3 x 2.0 x 2.5 cm hypodense nodule arises from the lower pole of the left lobe thyroid. Disc levels: Mild diffuse facet hypertrophy. Disc spaces are well preserved. Upper chest: Central airway is patent.  Lung apices are clear. Other: Reconstructed images demonstrate no additional findings. IMPRESSION: 1. No acute cervical spine fracture. 2. Incidental 2.5 cm left lobe thyroid nodule. Recommend nonemergent thyroid US if clinically warranted given patient age. Electronically Signed   By: Randa Ngo M.D.   On: 12/26/2020 21:31   DG CHEST PORT 1 VIEW  Result Date: 01/07/2021 CLINICAL DATA:  Shortness of breath, weakness and dizziness. EXAM: PORTABLE CHEST 1 VIEW COMPARISON:  01/04/2021. FINDINGS: Previous median sternotomy and CABG. Chronic cardiomegaly. Pacemaker/AICD remains in place. There  is pulmonary venous hypertension with mild interstitial edema. Small pleural effusions with some basilar atelectasis, left more than right. Findings appear slightly worse than the study of 3 days ago. IMPRESSION: Congestive heart failure, mildly worsened since the study of 3 days ago. Electronically Signed   By: Nelson Chimes M.D.   On: 01/07/2021 11:11   DG Chest Port 1 View  Result Date: 01/04/2021 CLINICAL DATA:  S/P PICC central line placement EXAM: PORTABLE CHEST - 1 VIEW COMPARISON:  01/03/2019 FINDINGS: Right arm PICC line to the cavoatrial junction. Stable left subclavian AICD. Coarse airspace opacities at the left lung base, obscuring the left diaphragmatic leaflet. Heart size upper limits normal. Aortic Atherosclerosis (ICD10-170.0). CABG markers. Blunting of left lateral costophrenic angle suggesting small effusion. No pneumothorax. Right shoulder DJD. IMPRESSION: Right arm PICC line to the cavoatrial junction. Coarse left lower lobe airspace opacities. Electronically Signed   By: Lucrezia Europe M.D.   On: 01/04/2021 12:18   DG Chest Port 1 View  Result Date: 01/02/2021 CLINICAL DATA:  Reason for exam: sob Covid + Patient reports sob without any chest pains. Hx of afib, chf, diabetes, htn, prior MI 2005. Hx of defibrillator, cabg x3 2005. EXAM: PORTABLE CHEST - 1 VIEW COMPARISON:  01/01/2021 FINDINGS: Left subclavian AICD stable. Relatively low lung volumes with some crowding of perihilar and bibasilar bronchovascular structures. No confluent airspace disease. Stable borderline cardiomegaly. CABG markers. Aortic Atherosclerosis (ICD10-170.0). No effusion.  No pneumothorax. Sternotomy wires. Right shoulder DJD. Cholecystectomy clips. vertebral endplate spurring at multiple levels in the lower thoracic spine. IMPRESSION: Low volumes.  Stable chronic and postop changes as above. Electronically Signed   By: Lucrezia Europe M.D.   On: 01/02/2021 07:33   DG Chest Portable 1 View  Result Date:  01/01/2021 CLINICAL DATA:  Shortness of breath EXAM: PORTABLE CHEST 1 VIEW COMPARISON:  12/27/2020 FINDINGS: Left AICD remains in place, unchanged. Prior CABG. Cardiomegaly, vascular congestion. No overt edema. No confluent opacities or effusions. IMPRESSION: Cardiomegaly, vascular congestion. Electronically Signed   By: Rolm Baptise M.D.   On: 01/01/2021 15:46   DG CHEST PORT 1 VIEW  Result Date: 12/27/2020 CLINICAL DATA:  Shortness of breath, COVID EXAM: PORTABLE CHEST 1 VIEW COMPARISON:  12/25/2020 FINDINGS: Cardiomegaly with left chest multi lead pacer defibrillator. Prominent pulmonary vasculature without acute airspace opacity. Possible trace pleural effusions and or pleural thickening. IMPRESSION: Cardiomegaly and pulmonary vascular congestion. No acute airspace opacity. Possible trace pleural  effusions and or pleural thickening. Electronically Signed   By: Delanna Ahmadi M.D.   On: 12/27/2020 10:51   DG Chest Portable 1 View  Result Date: 12/25/2020 CLINICAL DATA:  COVID positive. Shortness of breath. EXAM: PORTABLE CHEST 1 VIEW COMPARISON:  January 2020 FINDINGS: Cardiac pacemaker and postsurgical changes are stable. Enlarged cardiac silhouette. Calcific atherosclerotic disease and tortuosity of the aorta. Subtle patchy areas of airspace consolidation in the lung bases. Osseous structures are without acute abnormality. Soft tissues are grossly normal. IMPRESSION: Subtle patchy areas of airspace consolidation in the lung bases may represent early atypical pneumonia. Electronically Signed   By: Fidela Salisbury M.D.   On: 12/25/2020 18:09   VAS [place] LOWER EXTREMITY VENOUS (DVT)  Result Date: 12/27/2020  Lower Venous DVT Study Patient Name:  MELTON WALLS  Date of Exam:   12/27/2020 Medical Rec #: [id number]        Accession #:    [id number] Date of Birth: 1928-09-29        Patient Gender: M Patient Age:   47 years Exam Location:  [facility] Procedure:      VAS [place] LOWER EXTREMITY VENOUS  (DVT) Referring Phys: A POWELL JR --------------------------------------------------------------------------------  Indications: Left > right LE edema.  Risk Factors: COVID-19 +. Comparison Study: No prior study Performing Technologist: Maudry Mayhew MHA, RDMS, RVT, RDCS  Examination Guidelines: A complete evaluation includes B-mode imaging, spectral Doppler, color Doppler, and power Doppler as needed of all accessible portions of each vessel. Bilateral testing is considered an integral part of a complete examination. Limited examinations for reoccurring indications may be performed as noted. The reflux portion of the exam is performed with the patient in reverse Trendelenburg.  +-----+---------------+---------+-----------+----------+--------------+ RIGHTCompressibilityPhasicitySpontaneityPropertiesThrombus Aging +-----+---------------+---------+-----------+----------+--------------+ CFV  Full           Yes      Yes                                 +-----+---------------+---------+-----------+----------+--------------+   +---------+---------------+---------+-----------+----------+--------------+ LEFT     CompressibilityPhasicitySpontaneityPropertiesThrombus Aging +---------+---------------+---------+-----------+----------+--------------+ CFV      Full           Yes      Yes                                 +---------+---------------+---------+-----------+----------+--------------+ SFJ      Full                                                        +---------+---------------+---------+-----------+----------+--------------+ FV Prox  Full                                                        +---------+---------------+---------+-----------+----------+--------------+ FV Mid   Full                                                        +---------+---------------+---------+-----------+----------+--------------+  FV DistalFull                                                         +---------+---------------+---------+-----------+----------+--------------+ PFV      Full                                                        +---------+---------------+---------+-----------+----------+--------------+ POP      Full           Yes      Yes                                 +---------+---------------+---------+-----------+----------+--------------+ PTV      Full                                                        +---------+---------------+---------+-----------+----------+--------------+ PERO     Full                                                        +---------+---------------+---------+-----------+----------+--------------+ Gastroc  Full                    Yes                                 +---------+---------------+---------+-----------+----------+--------------+    Summary: RIGHT: - No evidence of common femoral vein obstruction.  LEFT: - There is no evidence of deep vein thrombosis in the lower extremity.  - No cystic structure found in the popliteal fossa.  *See table(s) above for measurements and observations. Electronically signed by Servando Snare MD on 12/27/2020 at 1:44:21 PM.    Final    [place] EKG SITE RITE  Result Date: 01/04/2021 If Site Rite image not attached, placement could not be confirmed due to current cardiac rhythm.   Lab Data:  CBC: Recent Labs  Lab 01/01/21 1439 01/02/21 0439 01/03/21 0403 01/04/21 0411 01/05/21 0350 01/06/21 1109  WBC 13.6* 15.1* 14.9* 12.9* 9.8 8.9  NEUTROABS 11.6*  --  12.5* 11.0* 8.1* 6.5  HGB 9.1* 9.0* 9.4* 9.9* 9.0* 8.4*  HCT 30.9* 32.1* 32.2* 33.3* 30.3* 28.4*  MCV 86.1 87.9 85.9 84.3 83.7 84.0  PLT 220 221 230 247 253 329   Basic Metabolic Panel: Recent Labs  Lab 01/02/21 0808 01/03/21 0403 01/04/21 0411 01/05/21 0350 01/06/21 0358 01/07/21 0441  NA  --  139 130* 136 133* 134*  K  --  4.2 3.9 4.1 3.5 4.0  CL  --  104 96* 100 97* 100  CO2  --  26 26 25 25 26    GLUCOSE  --  94 198* 222* 231* 203*  BUN  --  64* 66* 76*  79* 76*  CREATININE  --  2.23* 2.45* 2.85* 2.71* 2.27*  CALCIUM  --  8.5* 8.2* 8.4* 8.1* 8.4*  MG 2.3 2.0 2.0 2.0 1.7  --    GFR: Estimated Creatinine Clearance: 24.1 mL/min (A) (by C-G formula based on SCr of 2.27 mg/dL (H)). Liver Function Tests: Recent Labs  Lab 01/03/21 0403 01/04/21 0411 01/05/21 0350 01/06/21 0358  AST 14* 15 15 13*  ALT 24 22 19 16   ALKPHOS 79 91 90 77  BILITOT 0.8 0.5 0.5 0.7  PROT 5.2* 5.4* 5.1* 4.8*  ALBUMIN 2.4* 2.5* 2.3* 2.1*   No results for input(s): LIPASE, AMYLASE in the last 168 hours. No results for input(s): AMMONIA in the last 168 hours. Coagulation Profile: No results for input(s): INR, PROTIME in the last 168 hours. Cardiac Enzymes: No results for input(s): CKTOTAL, CKMB, CKMBINDEX, TROPONINI in the last 168 hours. BNP (last 3 results) No results for input(s): PROBNP in the last 8760 hours. HbA1C: No results for input(s): HGBA1C in the last 72 hours. CBG: Recent Labs  Lab 01/06/21 1255 01/06/21 1333 01/06/21 2039 01/07/21 0516 01/07/21 1155  GLUCAP 59* 156* 351* 143* 161*   Lipid Profile: No results for input(s): CHOL, HDL, LDLCALC, TRIG, CHOLHDL, LDLDIRECT in the last 72 hours. Thyroid Function Tests: No results for input(s): TSH, T4TOTAL, FREET4, T3FREE, THYROIDAB in the last 72 hours. Anemia Panel: No results for input(s): VITAMINB12, FOLATE, FERRITIN, TIBC, IRON, RETICCTPCT in the last 72 hours. Urine analysis:    Component Value Date/Time   COLORURINE YELLOW 01/01/2021 2046   APPEARANCEUR CLEAR 01/01/2021 2046   LABSPEC 1.010 01/01/2021 2046   PHURINE 5.0 01/01/2021 2046   GLUCOSEU >=500 (A) 01/01/2021 2046   HGBUR NEGATIVE 01/01/2021 2046   HGBUR negative 07/14/2008 0852   BILIRUBINUR NEGATIVE 01/01/2021 2046   BILIRUBINUR n 08/15/2016 Black 01/01/2021 2046   PROTEINUR NEGATIVE 01/01/2021 2046   UROBILINOGEN 0.2 08/15/2016 1322    UROBILINOGEN 0.2 04/29/2014 1006   NITRITE NEGATIVE 01/01/2021 2046   LEUKOCYTESUR NEGATIVE 01/01/2021 2046     Somya Jauregui M.D. [facility] 01/07/2021, 1:15 PM  Available via Epic secure chat 7am-7pm After 7 pm, please refer to night coverage provider listed on amion.

## 2021-01-07 NOTE — Progress Notes (Signed)
CARDIAC REHAB PHASE I   PRE:  Rate/Rhythm: 74 paced  BP:  Supine:   Sitting: 119/61  Standing:    SaO2: 100% 2L  MODE:  Ambulation: 274 ft   POST:  Rate/Rhythm: 86 SR  BP:  Supine:   Sitting: 134/70  Standing:    SaO2: 91@ RA Tolerated ambulation well with assistance x 2 and a rolling walker.  Due to equipment he is a x 2 assist.  He uses a walker at home and lives with his 85 y/o wife.  His O2 saturations were Normal on 2L.  Productive cough, IS encouraged.  Leans forward when walking, reminded to stand up straight. 1010-1100 Liliane Channel RN, BSN 01/07/2021 11:08 AM

## 2021-01-08 DIAGNOSIS — I2510 Atherosclerotic heart disease of native coronary artery without angina pectoris: Secondary | ICD-10-CM | POA: Diagnosis not present

## 2021-01-08 DIAGNOSIS — N170 Acute kidney failure with tubular necrosis: Secondary | ICD-10-CM | POA: Diagnosis not present

## 2021-01-08 DIAGNOSIS — I5023 Acute on chronic systolic (congestive) heart failure: Secondary | ICD-10-CM | POA: Diagnosis not present

## 2021-01-08 DIAGNOSIS — I480 Paroxysmal atrial fibrillation: Secondary | ICD-10-CM | POA: Diagnosis not present

## 2021-01-08 LAB — GLUCOSE, CAPILLARY
Glucose-Capillary: 76 mg/dL (ref 70–99)
Glucose-Capillary: 133 mg/dL — ABNORMAL HIGH (ref 70–99)
Glucose-Capillary: 229 mg/dL — ABNORMAL HIGH (ref 70–99)
Glucose-Capillary: 232 mg/dL — ABNORMAL HIGH (ref 70–99)

## 2021-01-08 LAB — BASIC METABOLIC PANEL
Anion gap: 8 (ref 5–15)
BUN: 68 mg/dL — ABNORMAL HIGH (ref 8–23)
CO2: 26 mmol/L (ref 22–32)
Calcium: 8.5 mg/dL — ABNORMAL LOW (ref 8.9–10.3)
Chloride: 102 mmol/L (ref 98–111)
Creatinine, Ser: 2.11 mg/dL — ABNORMAL HIGH (ref 0.61–1.24)
GFR, Estimated: 29 mL/min — ABNORMAL LOW (ref >60)
Glucose, Bld: 81 mg/dL (ref 70–99)
Potassium: 3.9 mmol/L (ref 3.5–5.1)
Sodium: 136 mmol/L (ref 135–145)

## 2021-01-08 LAB — COOXEMETRY PANEL
Carboxyhemoglobin: 0 % — ABNORMAL LOW (ref 0.5–1.5)
Methemoglobin: 0.6 % (ref 0.0–1.5)
O2 Saturation: 57.5 %
Total hemoglobin: 9.2 g/dL — ABNORMAL LOW (ref 12.0–16.0)

## 2021-01-08 LAB — MAGNESIUM: Magnesium: 2 mg/dL (ref 1.7–2.4)

## 2021-01-08 NOTE — Progress Notes (Addendum)
Patient ID: [name], male   DOB: 1928/11/07, 87 y.o.   MRN: [id number]     Advanced Heart Failure Rounding Note  PCP-Cardiologist: None   Subjective:    Underwent DC-CV on 10/21. Remains in NSR.   Feels good. Denies CP, SOB, orthopnea or PND. No bleeding on Eliquis.   Had skin tear on RUE over night  Milrinone stopped yesterday. Co-ox 58%  CVP 7-8 (checked personally)  Torsemide increased to 40 daily yesterday. Weight down 4 pounds. SCr improved   Objective:   Weight Range: 77.1 kg Body mass index is 21.82 kg/m.   Vital Signs:   Temp:  [97.6 F (36.4 C)-97.9 F (36.6 C)] 97.9 F (36.6 C) (10/23 1053) Pulse Rate:  [73] 73 (10/23 1053) Resp:  [16-18] 18 (10/23 1053) BP: (112-125)/(56-67) 112/56 (10/23 1053) SpO2:  [94 %-95 %] 94 % (10/23 1053) Weight:  [77.1 kg] 77.1 kg (10/23 0400) Last BM Date: 01/07/21  Weight change: Filed Weights   01/06/21 0405 01/07/21 0221 01/08/21 0400  Weight: 78.4 kg 78.8 kg 77.1 kg    Intake/Output:   Intake/Output Summary (Last 24 hours) at 01/08/2021 1259 Last data filed at 01/08/2021 1040 Gross per 24 hour  Intake 868.5 ml  Output 1500 ml  Net -631.5 ml       Physical Exam    General:  Elderly male lying in bed  No resp difficulty HEENT: normal Neck: supple. JVP 7-8. Carotids 2+ bilat; no bruits. No lymphadenopathy or thryomegaly appreciated. Cor: PMI nondisplaced. Regular rate & rhythm. No rubs, gallops or murmurs. Lungs: clear Abdomen: soft, nontender, nondistended. No hepatosplenomegaly. No bruits or masses. Good bowel sounds. Extremities: no cyanosis, clubbing, rash, edema  R forearm wrapped with gauze Neuro: alert & orientedx3, cranial nerves grossly intact. moves all 4 extremities w/o difficulty. Affect pleasant   Telemetry   AV paced 70-80s Personally reviewed   Labs    CBC Recent Labs    01/06/21 1109  WBC 8.9  NEUTROABS 6.5  HGB 8.4*  HCT 28.4*  MCV 84.0  PLT 332    Basic Metabolic  Panel Recent Labs    01/06/21 0358 01/07/21 0441 01/08/21 0500  NA 133* 134* 136  K 3.5 4.0 3.9  CL 97* 100 102  CO2 25 26 26   GLUCOSE 231* 203* 81  BUN 79* 76* 68*  CREATININE 2.71* 2.27* 2.11*  CALCIUM 8.1* 8.4* 8.5*  MG 1.7  --  2.0    Liver Function Tests Recent Labs    01/06/21 0358  AST 13*  ALT 16  ALKPHOS 77  BILITOT 0.7  PROT 4.8*  ALBUMIN 2.1*    No results for input(s): LIPASE, AMYLASE in the last 72 hours. Cardiac Enzymes No results for input(s): CKTOTAL, CKMB, CKMBINDEX, TROPONINI in the last 72 hours.  BNP: BNP (last 3 results) Recent Labs    01/04/21 0411 01/05/21 0350 01/06/21 1109  BNP 2,990.9* 1,925.1* 993.7*     ProBNP (last 3 results) No results for input(s): PROBNP in the last 8760 hours.   D-Dimer No results for input(s): DDIMER in the last 72 hours. Hemoglobin A1C No results for input(s): HGBA1C in the last 72 hours. Fasting Lipid Panel No results for input(s): CHOL, HDL, LDLCALC, TRIG, CHOLHDL, LDLDIRECT in the last 72 hours. Thyroid Function Tests No results for input(s): TSH, T4TOTAL, T3FREE, THYROIDAB in the last 72 hours.  Invalid input(s): FREET3  Other results:   Imaging    No results found.   Medications:  Scheduled Medications:  allopurinol  100 mg Oral Daily   apixaban  2.5 mg Oral BID   atorvastatin  40 mg Oral Daily   Chlorhexidine Gluconate Cloth  6 each Topical Daily   dapagliflozin propanediol  10 mg Oral QAC breakfast   escitalopram  10 mg Oral Daily   gabapentin  100 mg Oral BID   guaiFENesin  600 mg Oral BID   insulin aspart  0-9 Units Subcutaneous TID WC   insulin glargine-yfgn  30 Units Subcutaneous QHS   isosorbide mononitrate  15 mg Oral Daily   loratadine  10 mg Oral Daily   pramipexole  0.25 mg Oral Q24H   sodium chloride flush  10-40 mL Intracatheter Q12H   spironolactone  25 mg Oral Daily   tamsulosin  0.4 mg Oral QPC breakfast   torsemide  40 mg Oral Daily   vitamin B-12   1,000 mcg Oral Daily    Infusions:  amiodarone 30 mg/hr (01/08/21 0204)    PRN Medications: acetaminophen **OR** acetaminophen, guaiFENesin-dextromethorphan, lidocaine    Assessment/Plan   1. Acute on chronic systolic CHF: Ischemic cardiomyopathy.  Echo in 5/22 with EF 20-25%, low gradient moderate AS with mean gradient 12 mmHg and AVA 1.1. [employer] CRT-D device.  CHF exacerbation in setting of recurrent atrial fibrillation and loss of effective CRT.  Creatinine now trending back down,  2.2 => 2.45 => 2.85 => 2.71 => 2.27 +> 2.11.  Milrinone stopped 10/22 Co-ox 58% - Continue torsemide 40 mg daily.  (On 20mg  daily at home) - Hold digoxin until creatinine trends down further   - Continue Farxiga 10 mg daily.  - Continue spironolactone 25 daily.  - Hold Coreg and hydral/Imdur for now, SBP stable 100s-110s.  - Walked 250ft with CR 2. Atrial fibrillation: Paroxysmal.  Went into AF on 10/9, likely triggered by COVID-19 infection.  Loss of effective CRT with AF.  Back in NSR after DC-CV on 10/21 - Can switch amio to 200 bid - On Eliquis 2.5 bid and has not missed doses.   3. AKI on CKD stage 4: Baseline creatinine around 2.  Creatinine 2.85 => 2.71=> 2.27 => 2.11 today.  4. COVID-19: Admitted initially 10/8 with COVID-19. Received monoclonal ab and remdesivir initially. Still with cough though CHF likely contributes to this.  - Now off precautions.  5. CAD: s/p CABG.  No chest pain.  HS-TnI minimally elevated with no trend, likely demand ischemia. No s/s angina currently - Continue atorvastatin. - Not on ASA with stable CAD and apixaban use.  6. Skin tear on RUE  - consult Wound Care   Length of Stay: Howard, MD  01/08/2021, 1:00 PM  Advanced Heart Failure Team Pager [phone] (M-F; 7a - 5p)  Please contact [facility] for night-coverage after hours (5p -7a ) and weekends on [email or web address]

## 2021-01-08 NOTE — Progress Notes (Signed)
[facility]                                                                              Patient Demographics  [name], is a 87 y.o. male, DOB - February 09, 1929, UUV:[id number]  Admit date - 01/01/2021   Admitting Physician Orma Flaming, MD  Outpatient Primary MD for the patient is Panosh, Standley Brooking, MD  Outpatient specialists:   LOS - 7  days   Medical records reviewed and are as summarized below:    Chief Complaint  Patient presents with   Shortness of Breath   Dizziness   Leg Swelling       Brief summary   Patient is a 87 y.o. 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  Recently discharged on 12/27/2020 for CHF exacerbation and has continued to become progressively more short of breath since this time, he developed shortness of breath along with some dry cough, work-up suggestive of bronchitis along with underlying CHF.  Patient was admitted for further work-up.   Assessment & Plan    Principal Problem: Acute respiratory failure with hypoxia  Acute on chronic systolic CHF (congestive heart failure) ([place]), ischemic cardiomyopathy -IV Lasix, milrinone discontinued, currently on torsemide 40 mg daily  -Continue Aldactone, Farxiga -Holding Coreg, hydralazine, Imdur. - weak and debilitated, PT OT recommended home health PT.  Active Problems: Acute bronchitis -Patient was placed on Zithromax, Mucinex, flutter valve and supportive care -Currently stable, no wheezing or coughing  Paroxysmal atrial fibrillation - underwent DCCV on 10/21, now in NSR, on IV amiodarone --In normal sinus rhythm, continue Eliquis  Recent COVID-19 infection -Patient had tested positive on 12/24/2020 and had received monoclonal antibody infusion during the last  hospitalization -No acute issues, off isolation  Acute kidney injury superimposed on stage IIIb CKD -Baseline creatinine 2.15 -Creatinine improved, 2.1 at baseline  Essential hypertension -H&H stable  Obstructive sleep apnea -Continue CPAP  Iron deficiency anemia H&H stable  Restless leg syndrome -Continue Mirapex  Diabetes mellitus type 2, IDDM uncontrolled with hyperglycemia, CKD 3B -Continue basal insulin, sliding scale insulin -CBGs remained stable  Recent Labs    01/07/21 0516 01/07/21 1155 01/07/21 1641 01/07/21 2146 01/08/21 0619 01/08/21 1053  GLUCAP 143* 161* 163* 120* 76 133*    Pressure injury, left arm anterior upper skin tear -Present on admission, wound care per nursing Pressure Injury 12/26/20 Arm Anterior;Left;Proximal;Upper skin tear (Active)  12/26/20 0106  Location: Arm  Location Orientation: Anterior;Left;Proximal;Upper  Staging:   Wound Description (Comments): skin tear  Present on Admission: Yes     Code Status: DNR DVT Prophylaxis:  Place TED hose Start: 01/02/21 1518 apixaban (ELIQUIS) tablet 2.5 mg Start: 01/01/21 2200 apixaban (ELIQUIS) tablet 2.5 mg   Level of Care: Level of care: Telemetry Cardiac Family Communication: Discussed all imaging results, lab results, explained to the patient and wife on the phone.  Discussed with patient's wife in detail, stated that she is also 75  years old and will have difficult time providing care to him.  Patient will likely not agree for rehab and she appreciates home health PT OT and maximizing all the resources available including home health aide RN.  Requested commode.  They have been trying to get a wheelchair from the [place] and have been unsuccessful so far.  Will discuss with case management in a.m.   Disposition Plan:     Status is: Inpatient  Remains inpatient appropriate because: Once cleared by cardiology.  Hoping DC next 24 to 48 hours  Time Spent in minutes 25 minutes  Procedures:   Cardioversion on 10/21  Consultants:   Cardiology  Antimicrobials:   Anti-infectives (From admission, onward)    Start     Dose/Rate Route Frequency Ordered Stop   01/03/21 1000  azithromycin (ZITHROMAX) tablet 250 mg  Status:  Discontinued        250 mg Oral Daily 01/02/21 1049 01/02/21 1119   01/02/21 1130  doxycycline (VIBRA-TABS) tablet 100 mg        100 mg Oral Every 12 hours 01/02/21 1119 01/06/21 2034   01/02/21 1100  azithromycin (ZITHROMAX) tablet 500 mg  Status:  Discontinued        500 mg Oral  Once 01/02/21 1048 01/02/21 1119          Medications  Scheduled Meds:  allopurinol  100 mg Oral Daily   apixaban  2.5 mg Oral BID   atorvastatin  40 mg Oral Daily   Chlorhexidine Gluconate Cloth  6 each Topical Daily   dapagliflozin propanediol  10 mg Oral QAC breakfast   escitalopram  10 mg Oral Daily   gabapentin  100 mg Oral BID   guaiFENesin  600 mg Oral BID   insulin aspart  0-9 Units Subcutaneous TID WC   insulin glargine-yfgn  30 Units Subcutaneous QHS   isosorbide mononitrate  15 mg Oral Daily   loratadine  10 mg Oral Daily   pramipexole  0.25 mg Oral Q24H   sodium chloride flush  10-40 mL Intracatheter Q12H   spironolactone  25 mg Oral Daily   tamsulosin  0.4 mg Oral QPC breakfast   torsemide  40 mg Oral Daily   vitamin B-12  1,000 mcg Oral Daily   Continuous Infusions:  amiodarone 30 mg/hr (01/08/21 0204)   PRN Meds:.acetaminophen **OR** acetaminophen, guaiFENesin-dextromethorphan, lidocaine      Subjective:   [name] was seen and examined today.  Weak and debilitated, somewhat frustrated that he is not able to walk.  Otherwise no acute chest pain or shortness of breath, no nausea or vomiting.    Objective:   Vitals:   01/07/21 1200 01/07/21 2000 01/08/21 0400 01/08/21 1053  BP: 129/64 120/67 125/64 (!) 112/56  Pulse: 76   73  Resp: 18 16 17 18   Temp: 98.7 F (37.1 C) 97.9 F (36.6 C) 97.6 F (36.4 C) 97.9 F (36.6 C)   TempSrc: Oral Oral Oral Oral  SpO2: 96% 95% 94% 94%  Weight:   77.1 kg   Height:        Intake/Output Summary (Last 24 hours) at 01/08/2021 1331 Last data filed at 01/08/2021 1040 Gross per 24 hour  Intake 868.5 ml  Output 1500 ml  Net -631.5 ml     Wt Readings from Last 3 Encounters:  01/08/21 77.1 kg  12/27/20 76.9 kg  11/17/20 77.5 kg   Physical Exam General: Alert and oriented x 3, NAD, frail and ill-appearing Cardiovascular: S1 S2  clear, RRR. No pedal edema b/l Respiratory: CTAB, no wheezing, rales or rhonchi Gastrointestinal: Soft, nontender, nondistended, NBS Ext: no pedal edema bilaterally Neuro: no new deficits    Data Reviewed:  I have personally reviewed following labs and imaging studies  Micro Results Recent Results (from the past 240 hour(s))  Resp Panel by RT-PCR (Flu A&B, Covid) Nasopharyngeal Swab     Status: Abnormal   Collection Time: 01/01/21  2:34 PM   Specimen: Nasopharyngeal Swab; Nasopharyngeal(NP) swabs in vial transport medium  Result Value Ref Range Status   SARS Coronavirus 2 by RT PCR POSITIVE (A) NEGATIVE Final    Comment: RESULT CALLED TO, READ BACK BY AND VERIFIED WITH: RN J.COOK ON [id number] AT 1650 BY E.PARRISH (NOTE) SARS-CoV-2 target nucleic acids are DETECTED.  The SARS-CoV-2 RNA is generally detectable in upper respiratory specimens during the acute phase of infection. Positive results are indicative of the presence of the identified virus, but do not rule out bacterial infection or co-infection with other pathogens not detected by the test. Clinical correlation with patient history and other diagnostic information is necessary to determine patient infection status. The expected result is Negative.  Fact Sheet for Patients: EntrepreneurPulse.com.au  Fact Sheet for Healthcare Providers: IncredibleEmployment.be  This test is not yet approved or cleared by the [place] FDA and  has  been authorized for detection and/or diagnosis of SARS-CoV-2 by FDA under an Emergency Use Authorization (EUA).  This EUA will remain in effect (meaning this te st can be used) for the duration of  the COVID-19 declaration under Section 564(b)(1) of the Act, 21 U.S.C. section 360bbb-3(b)(1), unless the authorization is terminated or revoked sooner.     Influenza A by PCR NEGATIVE NEGATIVE Final   Influenza B by PCR NEGATIVE NEGATIVE Final    Comment: (NOTE) The Xpert Xpress SARS-CoV-2/FLU/RSV plus assay is intended as an aid in the diagnosis of influenza from Nasopharyngeal swab specimens and should not be used as a sole basis for treatment. Nasal washings and aspirates are unacceptable for Xpert Xpress SARS-CoV-2/FLU/RSV testing.  Fact Sheet for Patients: EntrepreneurPulse.com.au  Fact Sheet for Healthcare Providers: IncredibleEmployment.be  This test is not yet approved or cleared by the [place] FDA and has been authorized for detection and/or diagnosis of SARS-CoV-2 by FDA under an Emergency Use Authorization (EUA). This EUA will remain in effect (meaning this test can be used) for the duration of the COVID-19 declaration under Section 564(b)(1) of the Act, 21 U.S.C. section 360bbb-3(b)(1), unless the authorization is terminated or revoked.  Performed at [facility], [place] [address], [place], [place] [address]     Radiology Reports CT Head Wo Contrast  Result Date: 01/01/2021 CLINICAL DATA:  Head trauma, mod-severe fall, hematoma to crown of head EXAM: CT HEAD WITHOUT CONTRAST TECHNIQUE: Contiguous axial images were obtained from the base of the skull through the vertex without intravenous contrast. COMPARISON:  12/26/2020 FINDINGS: Brain: There is atrophy and chronic small vessel disease changes. No acute intracranial abnormality. Specifically, no hemorrhage, hydrocephalus, mass lesion, acute infarction, or significant  intracranial injury. Vascular: No hyperdense vessel or unexpected calcification. Skull: No acute calvarial abnormality. Sinuses/Orbits: No acute findings Other: None IMPRESSION: Atrophy, chronic microvascular disease. No acute intracranial abnormality. Electronically Signed   By: Rolm Baptise M.D.   On: 01/01/2021 16:43   CT HEAD WO CONTRAST (5MM)  Result Date: 12/26/2020 CLINICAL DATA:  Head and neck trauma EXAM: CT HEAD WITHOUT CONTRAST TECHNIQUE: Contiguous axial images were obtained from the  base of the skull through the vertex without intravenous contrast. COMPARISON:  12/16/2009 FINDINGS: Brain: Progressive confluent hypodensities throughout the periventricular white matter consistent with chronic small vessel ischemic changes. No signs of acute infarct or hemorrhage. Lateral ventricles and midline structures are unremarkable. No acute extra-axial fluid collections. No mass effect. Vascular: Prominent atherosclerosis of the internal carotid arteries. No hyperdense vessel. Skull: Small right parietal scalp hematoma. No underlying fracture. Negative for fracture or focal lesion. Sinuses/Orbits: Mild mucosal thickening within the ethmoid and sphenoid sinuses. Other: None. IMPRESSION: 1. No acute intracranial process. 2. Small right parietal scalp hematoma. Electronically Signed   By: Randa Ngo M.D.   On: 12/26/2020 21:26   CT CERVICAL SPINE WO CONTRAST  Result Date: 12/26/2020 CLINICAL DATA:  Head and neck trauma EXAM: CT CERVICAL SPINE WITHOUT CONTRAST TECHNIQUE: Multidetector CT imaging of the cervical spine was performed without intravenous contrast. Multiplanar CT image reconstructions were also generated. COMPARISON:  None. FINDINGS: Alignment: Alignment is grossly anatomic. Skull base and vertebrae: No acute fracture. No primary bone lesion or focal pathologic process. Soft tissues and spinal canal: No prevertebral fluid or swelling. No visible canal hematoma. 2.3 x 2.0 x 2.5 cm hypodense  nodule arises from the lower pole of the left lobe thyroid. Disc levels: Mild diffuse facet hypertrophy. Disc spaces are well preserved. Upper chest: Central airway is patent.  Lung apices are clear. Other: Reconstructed images demonstrate no additional findings. IMPRESSION: 1. No acute cervical spine fracture. 2. Incidental 2.5 cm left lobe thyroid nodule. Recommend nonemergent thyroid US if clinically warranted given patient age. Electronically Signed   By: Randa Ngo M.D.   On: 12/26/2020 21:31   DG CHEST PORT 1 VIEW  Result Date: 01/07/2021 CLINICAL DATA:  Shortness of breath, weakness and dizziness. EXAM: PORTABLE CHEST 1 VIEW COMPARISON:  01/04/2021. FINDINGS: Previous median sternotomy and CABG. Chronic cardiomegaly. Pacemaker/AICD remains in place. There is pulmonary venous hypertension with mild interstitial edema. Small pleural effusions with some basilar atelectasis, left more than right. Findings appear slightly worse than the study of 3 days ago. IMPRESSION: Congestive heart failure, mildly worsened since the study of 3 days ago. Electronically Signed   By: Nelson Chimes M.D.   On: 01/07/2021 11:11   DG Chest Port 1 View  Result Date: 01/04/2021 CLINICAL DATA:  S/P PICC central line placement EXAM: PORTABLE CHEST - 1 VIEW COMPARISON:  01/03/2019 FINDINGS: Right arm PICC line to the cavoatrial junction. Stable left subclavian AICD. Coarse airspace opacities at the left lung base, obscuring the left diaphragmatic leaflet. Heart size upper limits normal. Aortic Atherosclerosis (ICD10-170.0). CABG markers. Blunting of left lateral costophrenic angle suggesting small effusion. No pneumothorax. Right shoulder DJD. IMPRESSION: Right arm PICC line to the cavoatrial junction. Coarse left lower lobe airspace opacities. Electronically Signed   By: Lucrezia Europe M.D.   On: 01/04/2021 12:18   DG Chest Port 1 View  Result Date: 01/02/2021 CLINICAL DATA:  Reason for exam: sob Covid + Patient reports sob  without any chest pains. Hx of afib, chf, diabetes, htn, prior MI 2005. Hx of defibrillator, cabg x3 2005. EXAM: PORTABLE CHEST - 1 VIEW COMPARISON:  01/01/2021 FINDINGS: Left subclavian AICD stable. Relatively low lung volumes with some crowding of perihilar and bibasilar bronchovascular structures. No confluent airspace disease. Stable borderline cardiomegaly. CABG markers. Aortic Atherosclerosis (ICD10-170.0). No effusion.  No pneumothorax. Sternotomy wires. Right shoulder DJD. Cholecystectomy clips. vertebral endplate spurring at multiple levels in the lower thoracic spine. IMPRESSION: Low volumes.  Stable  chronic and postop changes as above. Electronically Signed   By: Lucrezia Europe M.D.   On: 01/02/2021 07:33   DG Chest Portable 1 View  Result Date: 01/01/2021 CLINICAL DATA:  Shortness of breath EXAM: PORTABLE CHEST 1 VIEW COMPARISON:  12/27/2020 FINDINGS: Left AICD remains in place, unchanged. Prior CABG. Cardiomegaly, vascular congestion. No overt edema. No confluent opacities or effusions. IMPRESSION: Cardiomegaly, vascular congestion. Electronically Signed   By: Rolm Baptise M.D.   On: 01/01/2021 15:46   DG CHEST PORT 1 VIEW  Result Date: 12/27/2020 CLINICAL DATA:  Shortness of breath, COVID EXAM: PORTABLE CHEST 1 VIEW COMPARISON:  12/25/2020 FINDINGS: Cardiomegaly with left chest multi lead pacer defibrillator. Prominent pulmonary vasculature without acute airspace opacity. Possible trace pleural effusions and or pleural thickening. IMPRESSION: Cardiomegaly and pulmonary vascular congestion. No acute airspace opacity. Possible trace pleural effusions and or pleural thickening. Electronically Signed   By: Delanna Ahmadi M.D.   On: 12/27/2020 10:51   DG Chest Portable 1 View  Result Date: 12/25/2020 CLINICAL DATA:  COVID positive. Shortness of breath. EXAM: PORTABLE CHEST 1 VIEW COMPARISON:  January 2020 FINDINGS: Cardiac pacemaker and postsurgical changes are stable. Enlarged cardiac silhouette.  Calcific atherosclerotic disease and tortuosity of the aorta. Subtle patchy areas of airspace consolidation in the lung bases. Osseous structures are without acute abnormality. Soft tissues are grossly normal. IMPRESSION: Subtle patchy areas of airspace consolidation in the lung bases may represent early atypical pneumonia. Electronically Signed   By: Fidela Salisbury M.D.   On: 12/25/2020 18:09   VAS [place] LOWER EXTREMITY VENOUS (DVT)  Result Date: 12/27/2020  Lower Venous DVT Study Patient Name:  MADS BORGMEYER  Date of Exam:   12/27/2020 Medical Rec #: [id number]        Accession #:    [id number] Date of Birth: 03-14-1929        Patient Gender: M Patient Age:   68 years Exam Location:  [facility] Procedure:      VAS [place] LOWER EXTREMITY VENOUS (DVT) Referring Phys: A POWELL JR --------------------------------------------------------------------------------  Indications: Left > right LE edema.  Risk Factors: COVID-19 +. Comparison Study: No prior study Performing Technologist: Maudry Mayhew MHA, RDMS, RVT, RDCS  Examination Guidelines: A complete evaluation includes B-mode imaging, spectral Doppler, color Doppler, and power Doppler as needed of all accessible portions of each vessel. Bilateral testing is considered an integral part of a complete examination. Limited examinations for reoccurring indications may be performed as noted. The reflux portion of the exam is performed with the patient in reverse Trendelenburg.  +-----+---------------+---------+-----------+----------+--------------+ RIGHTCompressibilityPhasicitySpontaneityPropertiesThrombus Aging +-----+---------------+---------+-----------+----------+--------------+ CFV  Full           Yes      Yes                                 +-----+---------------+---------+-----------+----------+--------------+   +---------+---------------+---------+-----------+----------+--------------+ LEFT      CompressibilityPhasicitySpontaneityPropertiesThrombus Aging +---------+---------------+---------+-----------+----------+--------------+ CFV      Full           Yes      Yes                                 +---------+---------------+---------+-----------+----------+--------------+ SFJ      Full                                                        +---------+---------------+---------+-----------+----------+--------------+  FV Prox  Full                                                        +---------+---------------+---------+-----------+----------+--------------+ FV Mid   Full                                                        +---------+---------------+---------+-----------+----------+--------------+ FV DistalFull                                                        +---------+---------------+---------+-----------+----------+--------------+ PFV      Full                                                        +---------+---------------+---------+-----------+----------+--------------+ POP      Full           Yes      Yes                                 +---------+---------------+---------+-----------+----------+--------------+ PTV      Full                                                        +---------+---------------+---------+-----------+----------+--------------+ PERO     Full                                                        +---------+---------------+---------+-----------+----------+--------------+ Gastroc  Full                    Yes                                 +---------+---------------+---------+-----------+----------+--------------+    Summary: RIGHT: - No evidence of common femoral vein obstruction.  LEFT: - There is no evidence of deep vein thrombosis in the lower extremity.  - No cystic structure found in the popliteal fossa.  *See table(s) above for measurements and observations. Electronically signed by  Servando Snare MD on 12/27/2020 at 1:44:21 PM.    Final    [place] EKG SITE RITE  Result Date: 01/04/2021 If Site Rite image not attached, placement could not be confirmed due to current cardiac rhythm.   Lab Data:  CBC: Recent Labs  Lab 01/01/21 1439 01/02/21 0439 01/03/21 0403 01/04/21 0411 01/05/21 0350 01/06/21 1109  WBC 13.6* 15.1* 14.9* 12.9* 9.8 8.9  NEUTROABS 11.6*  --  12.5* 11.0* 8.1* 6.5  HGB 9.1* 9.0* 9.4* 9.9* 9.0* 8.4*  HCT 30.9* 32.1* 32.2* 33.3* 30.3* 28.4*  MCV 86.1 87.9 85.9 84.3 83.7 84.0  PLT 220 221 230 247 253 195   Basic Metabolic Panel: Recent Labs  Lab 01/03/21 0403 01/04/21 0411 01/05/21 0350 01/06/21 0358 01/07/21 0441 01/08/21 0500  NA 139 130* 136 133* 134* 136  K 4.2 3.9 4.1 3.5 4.0 3.9  CL 104 96* 100 97* 100 102  CO2 26 26 25 25 26 26   GLUCOSE 94 198* 222* 231* 203* 81  BUN 64* 66* 76* 79* 76* 68*  CREATININE 2.23* 2.45* 2.85* 2.71* 2.27* 2.11*  CALCIUM 8.5* 8.2* 8.4* 8.1* 8.4* 8.5*  MG 2.0 2.0 2.0 1.7  --  2.0   GFR: Estimated Creatinine Clearance: 25.4 mL/min (A) (by C-G formula based on SCr of 2.11 mg/dL (H)). Liver Function Tests: Recent Labs  Lab 01/03/21 0403 01/04/21 0411 01/05/21 0350 01/06/21 0358  AST 14* 15 15 13*  ALT 24 22 19 16   ALKPHOS 79 91 90 77  BILITOT 0.8 0.5 0.5 0.7  PROT 5.2* 5.4* 5.1* 4.8*  ALBUMIN 2.4* 2.5* 2.3* 2.1*   No results for input(s): LIPASE, AMYLASE in the last 168 hours. No results for input(s): AMMONIA in the last 168 hours. Coagulation Profile: No results for input(s): INR, PROTIME in the last 168 hours. Cardiac Enzymes: No results for input(s): CKTOTAL, CKMB, CKMBINDEX, TROPONINI in the last 168 hours. BNP (last 3 results) No results for input(s): PROBNP in the last 8760 hours. HbA1C: No results for input(s): HGBA1C in the last 72 hours. CBG: Recent Labs  Lab 01/07/21 1155 01/07/21 1641 01/07/21 2146 01/08/21 0619 01/08/21 1053  GLUCAP 161* 163* 120* 76 133*   Lipid  Profile: No results for input(s): CHOL, HDL, LDLCALC, TRIG, CHOLHDL, LDLDIRECT in the last 72 hours. Thyroid Function Tests: No results for input(s): TSH, T4TOTAL, FREET4, T3FREE, THYROIDAB in the last 72 hours. Anemia Panel: No results for input(s): VITAMINB12, FOLATE, FERRITIN, TIBC, IRON, RETICCTPCT in the last 72 hours. Urine analysis:    Component Value Date/Time   COLORURINE YELLOW 01/01/2021 2046   APPEARANCEUR CLEAR 01/01/2021 2046   LABSPEC 1.010 01/01/2021 2046   PHURINE 5.0 01/01/2021 2046   GLUCOSEU >=500 (A) 01/01/2021 2046   HGBUR NEGATIVE 01/01/2021 2046   HGBUR negative 07/14/2008 0852   BILIRUBINUR NEGATIVE 01/01/2021 2046   BILIRUBINUR n 08/15/2016 [place] 01/01/2021 2046   PROTEINUR NEGATIVE 01/01/2021 2046   UROBILINOGEN 0.2 08/15/2016 1322   UROBILINOGEN 0.2 04/29/2014 1006   NITRITE NEGATIVE 01/01/2021 2046   LEUKOCYTESUR NEGATIVE 01/01/2021 2046     Zamire Whitehurst M.D. [facility] 01/08/2021, 1:31 PM  Available via Epic secure chat 7am-7pm After 7 pm, please refer to night coverage provider listed on amion.

## 2021-01-08 NOTE — Progress Notes (Signed)
Pt has skin tear on his right forearm. Pt stated the incident happened about 4 days ago when the nurse was removing the foam dressing from his arm. Cleaned area on arm with normal saline, dried, placed a petroleum dressing on site, abd pad, and gauze dressing. Wound care consult already placed.

## 2021-01-08 NOTE — Progress Notes (Signed)
Pravalon boots placed on patient.

## 2021-01-08 NOTE — Consult Note (Addendum)
WOC Nurse Consult Note: Reason for Consult: RUE skin tear.  Skin tears are within the scope of practice for the Bedside RN and guidance is provided via our skin care order set.  I have collaborated with the Bedside RN today, J. Herring. Wound type:Traumatic Pressure Injury POA: N/A Measurement: Length is approximately 9cm.  Measurements to be obtained by Damita Dunnings, RN today and documented on the Nursing Flow Sheet in LxWxD in cm. Wound bed:red, moist Drainage (amount, consistency, odor) small sero sanguinous Periwound: fragile, with areas of ecchymosis Dressing procedure/placement/frequency: Twice daily wound care with an atraumatic antimicrobial nonadherent product (xeroform) is indicated until reepithelialization occurs. To avoid having the dressing dry out, we will use several layers of the nonadherent dressing. Topper dressing will be with ABD pad for padding and protection of the extremity. Securement will be with a few turns of the Kerlix roll gauze/paper tape.  No tape on patient's skin.  Bedside RN is in agreement with this POC.  I appreciate her expertise and care for this patient.  [place] nursing team will not follow, but will remain available to this patient, the nursing and medical teams.  Please re-consult if needed. Thanks, Maudie Flakes, MSN, RN, [place], Arther Abbott  Pager# [phone]

## 2021-01-08 NOTE — Progress Notes (Signed)
Pt still refusing CPAP. Order discontinued per RT protocol. Pt aware that he can request CPAP at any time and we will set one up for him.

## 2021-01-09 DIAGNOSIS — I2510 Atherosclerotic heart disease of native coronary artery without angina pectoris: Secondary | ICD-10-CM | POA: Diagnosis not present

## 2021-01-09 DIAGNOSIS — I5023 Acute on chronic systolic (congestive) heart failure: Secondary | ICD-10-CM | POA: Diagnosis not present

## 2021-01-09 DIAGNOSIS — I480 Paroxysmal atrial fibrillation: Secondary | ICD-10-CM | POA: Diagnosis not present

## 2021-01-09 DIAGNOSIS — N171 Acute kidney failure with acute cortical necrosis: Secondary | ICD-10-CM | POA: Diagnosis not present

## 2021-01-09 LAB — CBC
HCT: 32.1 % — ABNORMAL LOW (ref 39.0–52.0)
Hemoglobin: 9.4 g/dL — ABNORMAL LOW (ref 13.0–17.0)
MCH: 24.4 pg — ABNORMAL LOW (ref 26.0–34.0)
MCHC: 29.3 g/dL — ABNORMAL LOW (ref 30.0–36.0)
MCV: 83.2 fL (ref 80.0–100.0)
Platelets: 276 10*3/uL (ref 150–400)
RBC: 3.86 MIL/uL — ABNORMAL LOW (ref 4.22–5.81)
RDW: 16.5 % — ABNORMAL HIGH (ref 11.5–15.5)
WBC: 11.7 10*3/uL — ABNORMAL HIGH (ref 4.0–10.5)
nRBC: 0 % (ref 0.0–0.2)

## 2021-01-09 LAB — GLUCOSE, CAPILLARY
Glucose-Capillary: 82 mg/dL (ref 70–99)
Glucose-Capillary: 157 mg/dL — ABNORMAL HIGH (ref 70–99)
Glucose-Capillary: 222 mg/dL — ABNORMAL HIGH (ref 70–99)
Glucose-Capillary: 230 mg/dL — ABNORMAL HIGH (ref 70–99)

## 2021-01-09 LAB — BASIC METABOLIC PANEL
Anion gap: 8 (ref 5–15)
BUN: 68 mg/dL — ABNORMAL HIGH (ref 8–23)
CO2: 26 mmol/L (ref 22–32)
Calcium: 8.2 mg/dL — ABNORMAL LOW (ref 8.9–10.3)
Chloride: 101 mmol/L (ref 98–111)
Creatinine, Ser: 2.14 mg/dL — ABNORMAL HIGH (ref 0.61–1.24)
GFR, Estimated: 29 mL/min — ABNORMAL LOW (ref >60)
Glucose, Bld: 139 mg/dL — ABNORMAL HIGH (ref 70–99)
Potassium: 3.6 mmol/L (ref 3.5–5.1)
Sodium: 135 mmol/L (ref 135–145)

## 2021-01-09 LAB — COOXEMETRY PANEL
Carboxyhemoglobin: 0.1 % — ABNORMAL LOW (ref 0.5–1.5)
Carboxyhemoglobin: 1 % (ref 0.5–1.5)
Methemoglobin: 0.6 % (ref 0.0–1.5)
Methemoglobin: 1.2 % (ref 0.0–1.5)
O2 Saturation: 57.2 %
O2 Saturation: 50.5 %
Total hemoglobin: 8.9 g/dL — ABNORMAL LOW (ref 12.0–16.0)
Total hemoglobin: 9.7 g/dL — ABNORMAL LOW (ref 12.0–16.0)

## 2021-01-09 MED ORDER — AMIODARONE HCL 200 MG PO TABS
200.0000 mg | ORAL_TABLET | Freq: Two times a day (BID) | ORAL | Status: DC
  Administered 2021-01-09 (×2): 200 mg via ORAL
  Filled 2021-01-09 (×2): qty 1

## 2021-01-09 MED ORDER — DIGOXIN 125 MCG PO TABS
0.0625 mg | ORAL_TABLET | ORAL | Status: DC
  Administered 2021-01-09: 0.0625 mg via ORAL
  Filled 2021-01-09: qty 1

## 2021-01-09 MED ORDER — POTASSIUM CHLORIDE 20 MEQ PO PACK: 20.0000 meq | PACK | Freq: Once | ORAL | Status: DC

## 2021-01-09 MED ORDER — POTASSIUM CHLORIDE CRYS ER 20 MEQ PO TBCR
20.0000 meq | EXTENDED_RELEASE_TABLET | Freq: Once | ORAL | Status: AC
  Administered 2021-01-09: 20 meq via ORAL
  Filled 2021-01-09: qty 1

## 2021-01-09 MED ORDER — DIGOXIN 125 MCG PO TABS
0.0625 mg | ORAL_TABLET | ORAL | Status: DC
  Administered 2021-01-11 – 2021-01-13 (×2): 0.0625 mg via ORAL
  Filled 2021-01-09 (×4): qty 1

## 2021-01-09 NOTE — Progress Notes (Signed)
[facility]                                                                              Patient Demographics  [name], is a 87 y.o. male, DOB - Nov 10, 1928, IEP:[id number]  Admit date - 01/01/2021   Admitting Physician Orma Flaming, MD  Outpatient Primary MD for the patient is Panosh, Standley Brooking, MD  Outpatient specialists:   LOS - 8  days   Medical records reviewed and are as summarized below:    Chief Complaint  Patient presents with   Shortness of Breath   Dizziness   Leg Swelling       Brief summary   Patient is a 87 y.o. 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  Recently discharged on 12/27/2020 for CHF exacerbation and has continued to become progressively more short of breath since this time, he developed shortness of breath along with some dry cough, work-up suggestive of bronchitis along with underlying CHF.  Patient was admitted for further work-up.   Assessment & Plan    Principal Problem: Acute respiratory failure with hypoxia  Acute on chronic systolic CHF (congestive heart failure) ([place]), ischemic cardiomyopathy -IV Lasix, milrinone discontinued, continue torsemide 40 mg daily (on 20 mg daily at home) -Continue Aldactone, Farxiga -Holding Coreg, hydralazine, Imdur. -PT OT had recommended home health PT, however he is very deconditioned and his wife is unable to provide assistance.  Patient now requesting SNF rehab.  Active Problems: Acute bronchitis -Completed doxycycline  -continue Mucinex flutter valve and supportive care -Stable, no wheezing, occasional coughing  Paroxysmal atrial fibrillation - underwent DCCV on 10/21, now in NSR -Transitioned  to oral amiodarone 200 mg twice daily -- Continue Eliquis 2.5 mg twice  daily  Recent COVID-19 infection -Patient had tested positive on 12/24/2020 and had received monoclonal antibody infusion during the last hospitalization -No acute issues, off isolation  Acute kidney injury superimposed on stage IIIb CKD -Baseline creatinine 2.15 -Creatinine stable, 2.1  Essential hypertension BP stable, holding Coreg, hydralazine, Imdur  Obstructive sleep apnea -Continue CPAP  Iron deficiency anemia Hemoglobin stable  Restless leg syndrome -Continue Mirapex  Diabetes mellitus type 2, IDDM uncontrolled with hyperglycemia, CKD 3B -CBGs overall stable, continue current regimen  Recent Labs    01/08/21 0619 01/08/21 1053 01/08/21 1626 01/08/21 2122 01/09/21 0615 01/09/21 1036  GLUCAP 76 133* 229* 232* 82 157*    Pressure injury, left arm anterior upper skin tear -Present on admission, wound care per nursing Right upper extremity skin tear, wound care was consulted   Code Status: DNR DVT Prophylaxis:  Place TED hose Start: 01/02/21 1518 apixaban (ELIQUIS) tablet 2.5 mg Start: 01/01/21 2200 apixaban (ELIQUIS) tablet 2.5 mg   Level of Care: Level of care: Telemetry Cardiac Family Communication: Discussed all imaging results, lab results, explained to the patient's wife on the phone on 10/23.    Disposition Plan:     Status is: Inpatient  Remains inpatient appropriate because: Plan for discharge to skilled nursing facility when bed available  Time Spent in minutes 25 minutes  Procedures:  Cardioversion on 10/21  Consultants:   Cardiology  Antimicrobials:   Anti-infectives (From admission, onward)    Start     Dose/Rate Route Frequency Ordered Stop   01/03/21 1000  azithromycin (ZITHROMAX) tablet 250 mg  Status:  Discontinued        250 mg Oral Daily 01/02/21 1049 01/02/21 1119   01/02/21 1130  doxycycline (VIBRA-TABS) tablet 100 mg        100 mg Oral Every 12 hours 01/02/21 1119 01/06/21 2034   01/02/21 1100  azithromycin (ZITHROMAX)  tablet 500 mg  Status:  Discontinued        500 mg Oral  Once 01/02/21 1048 01/02/21 1119          Medications  Scheduled Meds:  allopurinol  100 mg Oral Daily   amiodarone  200 mg Oral BID   apixaban  2.5 mg Oral BID   atorvastatin  40 mg Oral Daily   Chlorhexidine Gluconate Cloth  6 each Topical Daily   dapagliflozin propanediol  10 mg Oral QAC breakfast   [START ON 01/11/2021] digoxin  0.0625 mg Oral Q M,W,F   escitalopram  10 mg Oral Daily   gabapentin  100 mg Oral BID   guaiFENesin  600 mg Oral BID   insulin aspart  0-9 Units Subcutaneous TID WC   insulin glargine-yfgn  30 Units Subcutaneous QHS   isosorbide mononitrate  15 mg Oral Daily   loratadine  10 mg Oral Daily   pramipexole  0.25 mg Oral Q24H   sodium chloride flush  10-40 mL Intracatheter Q12H   spironolactone  25 mg Oral Daily   tamsulosin  0.4 mg Oral QPC breakfast   torsemide  40 mg Oral Daily   vitamin B-12  1,000 mcg Oral Daily   Continuous Infusions:   PRN Meds:.acetaminophen **OR** acetaminophen, guaiFENesin-dextromethorphan, lidocaine      Subjective:   [name] was seen and examined today.  Feels weak but otherwise no acute complaints.  States he is okay with going to the rehab before going home.  No acute chest pain, shortness of breath   Objective:   Vitals:   01/08/21 1931 01/09/21 0500 01/09/21 0536 01/09/21 1037  BP: (!) 112/57  (!) 126/57 114/66  Pulse: 93   65  Resp: 18  18 18   Temp: (!) 97.1 F (36.2 C)  97.6 F (36.4 C) 97.7 F (36.5 C)  TempSrc: Oral  Oral Oral  SpO2: 94%  94% 97%  Weight:  77.2 kg    Height:        Intake/Output Summary (Last 24 hours) at 01/09/2021 1551 Last data filed at 01/09/2021 1333 Gross per 24 hour  Intake 456 ml  Output 1850 ml  Net -1394 ml     Wt Readings from Last 3 Encounters:  01/09/21 77.2 kg  12/27/20 76.9 kg  11/17/20 77.5 kg    Physical Exam General: Alert and oriented x 3, weak and deconditioned,  ill-appearing Cardiovascular: S1 S2 clear, RRR Respiratory: CTAB, no wheezing, rales or rhonchi Gastrointestinal: Soft, nontender, nondistended, NBS Ext: 1+ pedal edema bilaterally, dressing on the right upper extremity Psych: Normal affect and demeanor, alert and oriented x3      Data Reviewed:  I have personally reviewed following labs and imaging studies  Micro Results Recent Results (from the past 240 hour(s))  Resp Panel by  RT-PCR (Flu A&B, Covid) Nasopharyngeal Swab     Status: Abnormal   Collection Time: 01/01/21  2:34 PM   Specimen: Nasopharyngeal Swab; Nasopharyngeal(NP) swabs in vial transport medium  Result Value Ref Range Status   SARS Coronavirus 2 by RT PCR POSITIVE (A) NEGATIVE Final    Comment: RESULT CALLED TO, READ BACK BY AND VERIFIED WITH: RN J.COOK ON [id number] AT 1650 BY E.PARRISH (NOTE) SARS-CoV-2 target nucleic acids are DETECTED.  The SARS-CoV-2 RNA is generally detectable in upper respiratory specimens during the acute phase of infection. Positive results are indicative of the presence of the identified virus, but do not rule out bacterial infection or co-infection with other pathogens not detected by the test. Clinical correlation with patient history and other diagnostic information is necessary to determine patient infection status. The expected result is Negative.  Fact Sheet for Patients: EntrepreneurPulse.com.au  Fact Sheet for Healthcare Providers: IncredibleEmployment.be  This test is not yet approved or cleared by the [place] FDA and  has been authorized for detection and/or diagnosis of SARS-CoV-2 by FDA under an Emergency Use Authorization (EUA).  This EUA will remain in effect (meaning this te st can be used) for the duration of  the COVID-19 declaration under Section 564(b)(1) of the Act, 21 U.S.C. section 360bbb-3(b)(1), unless the authorization is terminated or revoked sooner.     Influenza  A by PCR NEGATIVE NEGATIVE Final   Influenza B by PCR NEGATIVE NEGATIVE Final    Comment: (NOTE) The Xpert Xpress SARS-CoV-2/FLU/RSV plus assay is intended as an aid in the diagnosis of influenza from Nasopharyngeal swab specimens and should not be used as a sole basis for treatment. Nasal washings and aspirates are unacceptable for Xpert Xpress SARS-CoV-2/FLU/RSV testing.  Fact Sheet for Patients: EntrepreneurPulse.com.au  Fact Sheet for Healthcare Providers: IncredibleEmployment.be  This test is not yet approved or cleared by the [place] FDA and has been authorized for detection and/or diagnosis of SARS-CoV-2 by FDA under an Emergency Use Authorization (EUA). This EUA will remain in effect (meaning this test can be used) for the duration of the COVID-19 declaration under Section 564(b)(1) of the Act, 21 U.S.C. section 360bbb-3(b)(1), unless the authorization is terminated or revoked.  Performed at [facility], [place] [address] [place], [place] [address]     Radiology Reports CT Head Wo Contrast  Result Date: 01/01/2021 CLINICAL DATA:  Head trauma, mod-severe fall, hematoma to crown of head EXAM: CT HEAD WITHOUT CONTRAST TECHNIQUE: Contiguous axial images were obtained from the base of the skull through the vertex without intravenous contrast. COMPARISON:  12/26/2020 FINDINGS: Brain: There is atrophy and chronic small vessel disease changes. No acute intracranial abnormality. Specifically, no hemorrhage, hydrocephalus, mass lesion, acute infarction, or significant intracranial injury. Vascular: No hyperdense vessel or unexpected calcification. Skull: No acute calvarial abnormality. Sinuses/Orbits: No acute findings Other: None IMPRESSION: Atrophy, chronic microvascular disease. No acute intracranial abnormality. Electronically Signed   By: Rolm Baptise M.D.   On: 01/01/2021 16:43   CT HEAD WO CONTRAST (5MM)  Result Date:  12/26/2020 CLINICAL DATA:  Head and neck trauma EXAM: CT HEAD WITHOUT CONTRAST TECHNIQUE: Contiguous axial images were obtained from the base of the skull through the vertex without intravenous contrast. COMPARISON:  12/16/2009 FINDINGS: Brain: Progressive confluent hypodensities throughout the periventricular white matter consistent with chronic small vessel ischemic changes. No signs of acute infarct or hemorrhage. Lateral ventricles and midline structures are unremarkable. No acute extra-axial fluid collections. No mass effect. Vascular: Prominent atherosclerosis of  the internal carotid arteries. No hyperdense vessel. Skull: Small right parietal scalp hematoma. No underlying fracture. Negative for fracture or focal lesion. Sinuses/Orbits: Mild mucosal thickening within the ethmoid and sphenoid sinuses. Other: None. IMPRESSION: 1. No acute intracranial process. 2. Small right parietal scalp hematoma. Electronically Signed   By: Randa Ngo M.D.   On: 12/26/2020 21:26   CT CERVICAL SPINE WO CONTRAST  Result Date: 12/26/2020 CLINICAL DATA:  Head and neck trauma EXAM: CT CERVICAL SPINE WITHOUT CONTRAST TECHNIQUE: Multidetector CT imaging of the cervical spine was performed without intravenous contrast. Multiplanar CT image reconstructions were also generated. COMPARISON:  None. FINDINGS: Alignment: Alignment is grossly anatomic. Skull base and vertebrae: No acute fracture. No primary bone lesion or focal pathologic process. Soft tissues and spinal canal: No prevertebral fluid or swelling. No visible canal hematoma. 2.3 x 2.0 x 2.5 cm hypodense nodule arises from the lower pole of the left lobe thyroid. Disc levels: Mild diffuse facet hypertrophy. Disc spaces are well preserved. Upper chest: Central airway is patent.  Lung apices are clear. Other: Reconstructed images demonstrate no additional findings. IMPRESSION: 1. No acute cervical spine fracture. 2. Incidental 2.5 cm left lobe thyroid nodule. Recommend  nonemergent thyroid US if clinically warranted given patient age. Electronically Signed   By: Randa Ngo M.D.   On: 12/26/2020 21:31   DG CHEST PORT 1 VIEW  Result Date: 01/07/2021 CLINICAL DATA:  Shortness of breath, weakness and dizziness. EXAM: PORTABLE CHEST 1 VIEW COMPARISON:  01/04/2021. FINDINGS: Previous median sternotomy and CABG. Chronic cardiomegaly. Pacemaker/AICD remains in place. There is pulmonary venous hypertension with mild interstitial edema. Small pleural effusions with some basilar atelectasis, left more than right. Findings appear slightly worse than the study of 3 days ago. IMPRESSION: Congestive heart failure, mildly worsened since the study of 3 days ago. Electronically Signed   By: Nelson Chimes M.D.   On: 01/07/2021 11:11   DG Chest Port 1 View  Result Date: 01/04/2021 CLINICAL DATA:  S/P PICC central line placement EXAM: PORTABLE CHEST - 1 VIEW COMPARISON:  01/03/2019 FINDINGS: Right arm PICC line to the cavoatrial junction. Stable left subclavian AICD. Coarse airspace opacities at the left lung base, obscuring the left diaphragmatic leaflet. Heart size upper limits normal. Aortic Atherosclerosis (ICD10-170.0). CABG markers. Blunting of left lateral costophrenic angle suggesting small effusion. No pneumothorax. Right shoulder DJD. IMPRESSION: Right arm PICC line to the cavoatrial junction. Coarse left lower lobe airspace opacities. Electronically Signed   By: Lucrezia Europe M.D.   On: 01/04/2021 12:18   DG Chest Port 1 View  Result Date: 01/02/2021 CLINICAL DATA:  Reason for exam: sob Covid + Patient reports sob without any chest pains. Hx of afib, chf, diabetes, htn, prior MI 2005. Hx of defibrillator, cabg x3 2005. EXAM: PORTABLE CHEST - 1 VIEW COMPARISON:  01/01/2021 FINDINGS: Left subclavian AICD stable. Relatively low lung volumes with some crowding of perihilar and bibasilar bronchovascular structures. No confluent airspace disease. Stable borderline cardiomegaly. CABG  markers. Aortic Atherosclerosis (ICD10-170.0). No effusion.  No pneumothorax. Sternotomy wires. Right shoulder DJD. Cholecystectomy clips. vertebral endplate spurring at multiple levels in the lower thoracic spine. IMPRESSION: Low volumes.  Stable chronic and postop changes as above. Electronically Signed   By: Lucrezia Europe M.D.   On: 01/02/2021 07:33   DG Chest Portable 1 View  Result Date: 01/01/2021 CLINICAL DATA:  Shortness of breath EXAM: PORTABLE CHEST 1 VIEW COMPARISON:  12/27/2020 FINDINGS: Left AICD remains in place, unchanged. Prior CABG. Cardiomegaly, vascular  congestion. No overt edema. No confluent opacities or effusions. IMPRESSION: Cardiomegaly, vascular congestion. Electronically Signed   By: Rolm Baptise M.D.   On: 01/01/2021 15:46   DG CHEST PORT 1 VIEW  Result Date: 12/27/2020 CLINICAL DATA:  Shortness of breath, COVID EXAM: PORTABLE CHEST 1 VIEW COMPARISON:  12/25/2020 FINDINGS: Cardiomegaly with left chest multi lead pacer defibrillator. Prominent pulmonary vasculature without acute airspace opacity. Possible trace pleural effusions and or pleural thickening. IMPRESSION: Cardiomegaly and pulmonary vascular congestion. No acute airspace opacity. Possible trace pleural effusions and or pleural thickening. Electronically Signed   By: Delanna Ahmadi M.D.   On: 12/27/2020 10:51   DG Chest Portable 1 View  Result Date: 12/25/2020 CLINICAL DATA:  COVID positive. Shortness of breath. EXAM: PORTABLE CHEST 1 VIEW COMPARISON:  January 2020 FINDINGS: Cardiac pacemaker and postsurgical changes are stable. Enlarged cardiac silhouette. Calcific atherosclerotic disease and tortuosity of the aorta. Subtle patchy areas of airspace consolidation in the lung bases. Osseous structures are without acute abnormality. Soft tissues are grossly normal. IMPRESSION: Subtle patchy areas of airspace consolidation in the lung bases may represent early atypical pneumonia. Electronically Signed   By: Fidela Salisbury M.D.   On: 12/25/2020 18:09   VAS [place] LOWER EXTREMITY VENOUS (DVT)  Result Date: 12/27/2020  Lower Venous DVT Study Patient Name:  KYREESE CHIO  Date of Exam:   12/27/2020 Medical Rec #: [id number]        Accession #:    [id number] Date of Birth: 08/14/1928        Patient Gender: M Patient Age:   33 years Exam Location:  [facility] Procedure:      VAS [place] LOWER EXTREMITY VENOUS (DVT) Referring Phys: A POWELL JR --------------------------------------------------------------------------------  Indications: Left > right LE edema.  Risk Factors: COVID-19 +. Comparison Study: No prior study Performing Technologist: Maudry Mayhew MHA, RDMS, RVT, RDCS  Examination Guidelines: A complete evaluation includes B-mode imaging, spectral Doppler, color Doppler, and power Doppler as needed of all accessible portions of each vessel. Bilateral testing is considered an integral part of a complete examination. Limited examinations for reoccurring indications may be performed as noted. The reflux portion of the exam is performed with the patient in reverse Trendelenburg.  +-----+---------------+---------+-----------+----------+--------------+ RIGHTCompressibilityPhasicitySpontaneityPropertiesThrombus Aging +-----+---------------+---------+-----------+----------+--------------+ CFV  Full           Yes      Yes                                 +-----+---------------+---------+-----------+----------+--------------+   +---------+---------------+---------+-----------+----------+--------------+ LEFT     CompressibilityPhasicitySpontaneityPropertiesThrombus Aging +---------+---------------+---------+-----------+----------+--------------+ CFV      Full           Yes      Yes                                 +---------+---------------+---------+-----------+----------+--------------+ SFJ      Full                                                         +---------+---------------+---------+-----------+----------+--------------+ FV Prox  Full                                                        +---------+---------------+---------+-----------+----------+--------------+  FV Mid   Full                                                        +---------+---------------+---------+-----------+----------+--------------+ FV DistalFull                                                        +---------+---------------+---------+-----------+----------+--------------+ PFV      Full                                                        +---------+---------------+---------+-----------+----------+--------------+ POP      Full           Yes      Yes                                 +---------+---------------+---------+-----------+----------+--------------+ PTV      Full                                                        +---------+---------------+---------+-----------+----------+--------------+ PERO     Full                                                        +---------+---------------+---------+-----------+----------+--------------+ Gastroc  Full                    Yes                                 +---------+---------------+---------+-----------+----------+--------------+    Summary: RIGHT: - No evidence of common femoral vein obstruction.  LEFT: - There is no evidence of deep vein thrombosis in the lower extremity.  - No cystic structure found in the popliteal fossa.  *See table(s) above for measurements and observations. Electronically signed by Servando Snare MD on 12/27/2020 at 1:44:21 PM.    Final    [place] EKG SITE RITE  Result Date: 01/04/2021 If Site Rite image not attached, placement could not be confirmed due to current cardiac rhythm.   Lab Data:  CBC: Recent Labs  Lab 01/03/21 0403 01/04/21 0411 01/05/21 0350 01/06/21 1109  WBC 14.9* 12.9* 9.8 8.9  NEUTROABS 12.5* 11.0* 8.1* 6.5  HGB 9.4* 9.9*  9.0* 8.4*  HCT 32.2* 33.3* 30.3* 28.4*  MCV 85.9 84.3 83.7 84.0  PLT 230 247 253 509   Basic Metabolic Panel: Recent Labs  Lab 01/03/21 0403 01/04/21 0411 01/05/21 0350 01/06/21 0358 01/07/21 0441 01/08/21 0500 01/09/21 0330  NA 139 130* 136 133* 134* 136 135  K 4.2 3.9 4.1  3.5 4.0 3.9 3.6  CL 104 96* 100 97* 100 102 101  CO2 26 26 25 25 26 26 26   GLUCOSE 94 198* 222* 231* 203* 81 139*  BUN 64* 66* 76* 79* 76* 68* 68*  CREATININE 2.23* 2.45* 2.85* 2.71* 2.27* 2.11* 2.14*  CALCIUM 8.5* 8.2* 8.4* 8.1* 8.4* 8.5* 8.2*  MG 2.0 2.0 2.0 1.7  --  2.0  --    GFR: Estimated Creatinine Clearance: 25.1 mL/min (A) (by C-G formula based on SCr of 2.14 mg/dL (H)). Liver Function Tests: Recent Labs  Lab 01/03/21 0403 01/04/21 0411 01/05/21 0350 01/06/21 0358  AST 14* 15 15 13*  ALT 24 22 19 16   ALKPHOS 79 91 90 77  BILITOT 0.8 0.5 0.5 0.7  PROT 5.2* 5.4* 5.1* 4.8*  ALBUMIN 2.4* 2.5* 2.3* 2.1*   No results for input(s): LIPASE, AMYLASE in the last 168 hours. No results for input(s): AMMONIA in the last 168 hours. Coagulation Profile: No results for input(s): INR, PROTIME in the last 168 hours. Cardiac Enzymes: No results for input(s): CKTOTAL, CKMB, CKMBINDEX, TROPONINI in the last 168 hours. BNP (last 3 results) No results for input(s): PROBNP in the last 8760 hours. HbA1C: No results for input(s): HGBA1C in the last 72 hours. CBG: Recent Labs  Lab 01/08/21 1053 01/08/21 1626 01/08/21 2122 01/09/21 0615 01/09/21 1036  GLUCAP 133* 229* 232* 82 157*   Lipid Profile: No results for input(s): CHOL, HDL, LDLCALC, TRIG, CHOLHDL, LDLDIRECT in the last 72 hours. Thyroid Function Tests: No results for input(s): TSH, T4TOTAL, FREET4, T3FREE, THYROIDAB in the last 72 hours. Anemia Panel: No results for input(s): VITAMINB12, FOLATE, FERRITIN, TIBC, IRON, RETICCTPCT in the last 72 hours. Urine analysis:    Component Value Date/Time   COLORURINE YELLOW 01/01/2021 2046    APPEARANCEUR CLEAR 01/01/2021 2046   LABSPEC 1.010 01/01/2021 2046   PHURINE 5.0 01/01/2021 2046   GLUCOSEU >=500 (A) 01/01/2021 2046   HGBUR NEGATIVE 01/01/2021 2046   HGBUR negative 07/14/2008 0852   BILIRUBINUR NEGATIVE 01/01/2021 2046   BILIRUBINUR n 08/15/2016 [employer] 01/01/2021 2046   PROTEINUR NEGATIVE 01/01/2021 2046   UROBILINOGEN 0.2 08/15/2016 1322   UROBILINOGEN 0.2 04/29/2014 1006   NITRITE NEGATIVE 01/01/2021 2046   LEUKOCYTESUR NEGATIVE 01/01/2021 2046     Dalessandro Baldyga M.D. [facility] 01/09/2021, 3:51 PM  Available via Epic secure chat 7am-7pm After 7 pm, please refer to night coverage provider listed on amion.

## 2021-01-09 NOTE — TOC Initial Note (Addendum)
Transition of Care [facility]) - Initial/Assessment Note    Patient Details  Name: [name] MRN: [id number] Date of Birth: [birth year]  Transition of Care [facility]) CM/SW Contact:    Erenest Rasher, RN Phone Number: [phone] 01/09/2021, 3:29 PM  Clinical Narrative:                  HF TOC CM spoke to pt and dtr, [name] at bedside. Pt is requesting SNF rehab. Gave permission to fax referral and create FL2 for SNF.  Pt lives at home with wife who will not be able to assist in the home due to surgery soon. Has RW and rollator at home.     Expected Discharge Plan: Skilled Nursing Facility Barriers to Discharge: Continued Medical Work up   Patient Goals and CMS Choice Patient states their goals for this hospitalization and ongoing recovery are:: my legs are weak and I believe i will need rehab CMS Medicare.gov Compare Post Acute Care list provided to:: Patient    Expected Discharge Plan and Services Expected Discharge Plan: [place] In-house Referral: Clinical Social Work Discharge Planning Services: CM Consult Post Acute Care Choice: [place] Living arrangements for the past 2 months: [place]                                      Prior Living Arrangements/Services Living arrangements for the past 2 months: Single Family Home Lives with:: Spouse          Need for Family Participation in Patient Care: Yes (Comment) Care giver support system in place?: Yes (comment) Current home services: DME (rolling walker, rollator, cane) Criminal Activity/Legal Involvement Pertinent to Current Situation/Hospitalization: No - Comment as needed  Activities of Daily Living Home Assistive Devices/Equipment: CPAP ADL Screening (condition at time of admission) Patient's cognitive ability adequate to safely complete daily activities?: Yes Is the patient deaf or have difficulty hearing?: Yes Does the patient have difficulty seeing, even  when wearing glasses/contacts?: No Does the patient have difficulty concentrating, remembering, or making decisions?: No Patient able to express need for assistance with ADLs?: Yes Does the patient have difficulty dressing or bathing?: No Independently performs ADLs?: Yes (appropriate for developmental age) Does the patient have difficulty walking or climbing stairs?: Yes Weakness of Legs: Both Weakness of Arms/Hands: Both  Permission Sought/Granted Permission sought to share information with : Facility Sport and exercise psychologist, Case Manager, Family Supports, PCP Permission granted to share information with : Yes, Verbal Permission Granted  Share Information with NAME: [name]  Permission granted to share info w AGENCY: SNF rehab, [place] granted to share info w Relationship: daughter  Permission granted to share info w Contact Information: [phone]  Emotional Assessment Appearance:: Appears stated age Attitude/Demeanor/Rapport: Gracious, Engaged Affect (typically observed): Accepting Orientation: : Oriented to Self, Oriented to Place, Oriented to  Time, Oriented to Situation   Psych Involvement: No (comment)  Admission diagnosis:  Shortness of breath [R06.02] SOB (shortness of breath) [R06.02] Acute on chronic systolic CHF (congestive heart failure) (HCC) [I50.23] Patient Active Problem List   Diagnosis Date Noted   Acute on chronic systolic CHF (congestive heart failure) ([place]) 01/01/2021   Acute renal failure superimposed on stage 3b chronic kidney disease ([place]) 01/01/2021   Elevated troponin 01/01/2021   Mixed diabetic hyperlipidemia associated with type 2 diabetes mellitus ([place]) 12/26/2020   Elevated  troponin level not due myocardial infarction 12/26/2020   COVID-19 virus infection 12/25/2020   Nonexudative age-related macular degeneration, bilateral, stage unspecified 07/21/2020   Gout 03/25/2020   Callus 05/20/2019   Otalgia, left 01/15/2017    Arthritis of shoulder 12/10/2016   Chronic right shoulder pain 12/10/2016   Melanoma of skin ([place]) 01/23/2016   PAD (peripheral artery disease) ([place]) 09/27/2015   Sciatica 08/23/2014   Senile ecchymosis 08/23/2014   Atrial fibrillation ([place]) 07/28/2014   CHF exacerbation (HCC)    Hypokalemia    Abdominal distention    Cardiomyopathy, ischemic    Uncontrolled type 2 diabetes mellitus with hyperglycemia, with long-term current use of insulin (HCC)    Depression    HCAP (healthcare-associated pneumonia) 05/18/2014   Acute on chronic combined systolic and diastolic CHF (congestive heart failure) (HCC)    Acute respiratory failure with hypoxia (HCC)    Diabetes mellitus type 2, controlled ([place]) 04/28/2014   Leukocytosis 04/28/2014   Acute on chronic systolic congestive heart failure (HCC)    CHF (congestive heart failure) ([place]) 04/27/2014   Shortness of breath 03/25/2014   Weight gain 03/25/2014   Leg edema, left 03/25/2014   AF (paroxysmal atrial fibrillation) ([place]) 03/05/2014   Hyperlipemia 03/02/2014   Hyperkalemia 03/02/2014   Essential hypertension, benign 03/02/2014   Medication management 07/30/2013   Encounter for fitting or adjustment of automatic implantable cardioverter-defibrillator 04/16/2013   Corns/callosities 04/02/2013   Iron deficiency anemia 09/03/2012   Medically complex patient 09/03/2012   Nocturnal leg movements 06/23/2012   Sleep difficulties 01/12/2012   Back pain, sacroiliac 01/12/2012   Diabetic neuropathy, type II diabetes mellitus ([place]) 08/14/2011   Leg pain thigh with exercise  08/14/2011   Memory problem 08/14/2011   Hearing impaired hearing aids 08/14/2011   Neuropathy 08/14/2011   Fatigue 08/14/2011   CAD (coronary artery disease) 11/13/2010   Ischemic cardiomyopathy    Autoimmune hemolytic anemias 01/04/2010   ENLARGEMENT OF LYMPH NODES 01/04/2010   WEIGHT LOSS 01/03/2010   UNS ADVRS EFF OTH RX MEDICINAL&BIOLOGICAL SBSTNC 07/27/2009    Obstructive sleep apnea 07/19/2009   ARTHRITIS, HIP 04/20/2009   Cerebrovascular disease 28/31/5176   SYSTOLIC HEART FAILURE, CHRONIC 07/20/2008   RESTLESS LEG SYNDROME 07/14/2008   NOCTURIA 07/14/2008   Hyperlipidemia 09/25/2006   Essential hypertension 09/25/2006   PCP:  Burnis Medin, MD Pharmacy:   [facility], [place] [address] Phone: [phone] Fax: [place] [address], [place] - [employer] AT [facility] [address] [place] [address] Phone: [phone] Fax: [place], [place] - [address] [place] [address] Phone: [phone] Fax: [phone]     Social Determinants of Health (SDOH) Interventions    Readmission Risk Interventions No flowsheet data found.

## 2021-01-09 NOTE — Progress Notes (Signed)
HF TOC CM faxed referral to SNF rehab. [place], Heart Failure TOC CM [phone]

## 2021-01-09 NOTE — NC FL2 (Addendum)
[place] LEVEL OF CARE SCREENING TOOL     IDENTIFICATION  Patient Name: [name] Birthdate: [birth year] Sex: male Admission Date (Current Location): 01/01/2021  [facility] and [place] Number:  Herbalist and Address:  [facility], [place] [address], [place], [place] [address]      Provider Number: [id number]  Attending Physician Name and Address:  Mendel Corning, MD  Relative Name and Phone Number:  [name] # [phone]    Current Level of Care: Hospital Recommended Level of Care: [place] Prior Approval Number:    Date Approved/Denied:   PASRR Number: [id number]  Discharge Plan: SNF    Current Diagnoses: Patient Active Problem List   Diagnosis Date Noted   Acute on chronic systolic CHF (congestive heart failure) ([place]) 01/01/2021   Acute renal failure superimposed on stage 3b chronic kidney disease ([place]) 01/01/2021   Elevated troponin 01/01/2021   Mixed diabetic hyperlipidemia associated with type 2 diabetes mellitus ([place]) 12/26/2020   Elevated troponin level not due myocardial infarction 12/26/2020   COVID-19 virus infection 12/25/2020   Nonexudative age-related macular degeneration, bilateral, stage unspecified 07/21/2020   Gout 03/25/2020   Callus 05/20/2019   Otalgia, left 01/15/2017   Arthritis of shoulder 12/10/2016   Chronic right shoulder pain 12/10/2016   Melanoma of skin ([place]) 01/23/2016   PAD (peripheral artery disease) ([place]) 09/27/2015   Sciatica 08/23/2014   Senile ecchymosis 08/23/2014   Atrial fibrillation ([place]) 07/28/2014   CHF exacerbation (HCC)    Hypokalemia    Abdominal distention    Cardiomyopathy, ischemic    Uncontrolled type 2 diabetes mellitus with hyperglycemia, with long-term current use of insulin ([place])    Depression    HCAP (healthcare-associated pneumonia) 05/18/2014   Acute on chronic combined systolic and diastolic CHF (congestive heart failure) ([place])     Acute respiratory failure with hypoxia ([place])    Diabetes mellitus type 2, controlled ([place]) 04/28/2014   Leukocytosis 04/28/2014   Acute on chronic systolic congestive heart failure (HCC)    CHF (congestive heart failure) ([place]) 04/27/2014   Shortness of breath 03/25/2014   Weight gain 03/25/2014   Leg edema, left 03/25/2014   AF (paroxysmal atrial fibrillation) ([place]) 03/05/2014   Hyperlipemia 03/02/2014   Hyperkalemia 03/02/2014   Essential hypertension, benign 03/02/2014   Medication management 07/30/2013   Encounter for fitting or adjustment of automatic implantable cardioverter-defibrillator 04/16/2013   Corns/callosities 04/02/2013   Iron deficiency anemia 09/03/2012   Medically complex patient 09/03/2012   Nocturnal leg movements 06/23/2012   Sleep difficulties 01/12/2012   Back pain, sacroiliac 01/12/2012   Diabetic neuropathy, type II diabetes mellitus ([place]) 08/14/2011   Leg pain thigh with exercise  08/14/2011   Memory problem 08/14/2011   Hearing impaired hearing aids 08/14/2011   Neuropathy 08/14/2011   Fatigue 08/14/2011   CAD (coronary artery disease) 11/13/2010   Ischemic cardiomyopathy    Autoimmune hemolytic anemias 01/04/2010   ENLARGEMENT OF LYMPH NODES 01/04/2010   WEIGHT LOSS 01/03/2010   UNS ADVRS EFF OTH RX MEDICINAL&BIOLOGICAL SBSTNC 07/27/2009   Obstructive sleep apnea 07/19/2009   ARTHRITIS, HIP 04/20/2009   Cerebrovascular disease 11/57/2620   SYSTOLIC HEART FAILURE, CHRONIC 07/20/2008   RESTLESS LEG SYNDROME 07/14/2008   NOCTURIA 07/14/2008   Hyperlipidemia 09/25/2006   Essential hypertension 09/25/2006    Orientation RESPIRATION BLADDER Height & Weight     Self, Time, Situation, Place  Normal Incontinent Weight: 77.2 kg Height:  6\' 2"  (  188 cm)  BEHAVIORAL SYMPTOMS/MOOD NEUROLOGICAL BOWEL NUTRITION STATUS      Continent Diet (heart healthy)  AMBULATORY STATUS COMMUNICATION OF NEEDS Skin    Extensive Assist Verbally Other:skin tear on RUE                 Personal Care Assistance Level of Assistance  Bathing, Dressing, Feeding Bathing Assistance: Limited assistance Feeding assistance: Limited assistance Dressing Assistance: Limited assistance     Functional Limitations Info  Sight, Hearing, Speech Sight Info: Impaired (wears glasses) Hearing Info: Impaired (has hearing aids) Speech Info: Adequate    SPECIAL CARE FACTORS FREQUENCY  PT (By licensed PT), OT (By licensed OT)     PT Frequency: 5x per week OT Frequency: 5x per week            Contractures Contractures Info: Not present    Additional Factors Info  Code Status, Allergies Code Status Info: DNR Allergies Info: No None Allergies           Current Medications (01/09/2021):  This is the current hospital active medication list Current Facility-Administered Medications  Medication Dose Route Frequency Provider Last Rate Last Admin   acetaminophen (TYLENOL) tablet 650 mg  650 mg Oral Q6H PRN Orma Flaming, MD   650 mg at 01/05/21 1950   Or   acetaminophen (TYLENOL) suppository 650 mg  650 mg Rectal Q6H PRN Orma Flaming, MD       allopurinol (ZYLOPRIM) tablet 100 mg  100 mg Oral Daily Orma Flaming, MD   100 mg at 01/09/21 9390   amiodarone (PACERONE) tablet 200 mg  200 mg Oral BID Larey Dresser, MD   200 mg at 01/09/21 3009   apixaban (ELIQUIS) tablet 2.5 mg  2.5 mg Oral BID Orma Flaming, MD   2.5 mg at 01/09/21 2330   atorvastatin (LIPITOR) tablet 40 mg  40 mg Oral Daily Orma Flaming, MD   40 mg at 01/09/21 0762   Chlorhexidine Gluconate Cloth 2 % PADS 6 each  6 each Topical Daily Rai, Ripudeep K, MD   6 each at 01/09/21 0926   dapagliflozin propanediol (FARXIGA) tablet 10 mg  10 mg Oral QAC breakfast Orma Flaming, MD   10 mg at 01/09/21 0923   [START ON 01/11/2021] digoxin (LANOXIN) tablet 0.0625 mg  0.0625 mg Oral Q M,W,F Larey Dresser, MD       escitalopram [place]) tablet 10 mg  10 mg Oral Daily Orma Flaming, MD   10 mg at  01/09/21 2633   gabapentin (NEURONTIN) capsule 100 mg  100 mg Oral BID Orma Flaming, MD   100 mg at 01/09/21 0924   guaiFENesin (MUCINEX) 12 hr tablet 600 mg  600 mg Oral BID Thurnell Lose, MD   600 mg at 01/09/21 0925   guaiFENesin-dextromethorphan (ROBITUSSIN DM) 100-10 MG/5ML syrup 5 mL  5 mL Oral Q4H PRN Opyd, Ilene Qua, MD   5 mL at 01/07/21 1741   insulin aspart (novoLOG) injection 0-9 Units  0-9 Units Subcutaneous TID WC Orma Flaming, MD   2 Units at 01/09/21 1313   insulin glargine-yfgn (SEMGLEE) injection 30 Units  30 Units Subcutaneous QHS Orma Flaming, MD   30 Units at 01/08/21 2137   isosorbide mononitrate (IMDUR) 24 hr tablet 15 mg  15 mg Oral Daily Thurnell Lose, MD   15 mg at 01/09/21 0923   lidocaine (LIDODERM) 5 % 1 patch  1 patch Transdermal Daily PRN Orma Flaming, MD  loratadine (CLARITIN) tablet 10 mg  10 mg Oral Daily Thurnell Lose, MD   10 mg at 01/09/21 8768   pramipexole (MIRAPEX) tablet 0.25 mg  0.25 mg Oral Q24H Orma Flaming, MD   0.25 mg at 01/08/21 2100   sodium chloride flush (NS) 0.9 % injection 10-40 mL  10-40 mL Intracatheter Q12H Rai, Ripudeep K, MD   10 mL at 01/09/21 1157   spironolactone (ALDACTONE) tablet 25 mg  25 mg Oral Daily Thurnell Lose, MD   25 mg at 01/09/21 0923   tamsulosin (FLOMAX) capsule 0.4 mg  0.4 mg Oral QPC breakfast Orma Flaming, MD   0.4 mg at 01/09/21 2620   torsemide (DEMADEX) tablet 40 mg  40 mg Oral Daily Bensimhon, Shaune Pascal, MD   40 mg at 01/09/21 3559   vitamin B-12 (CYANOCOBALAMIN) tablet 1,000 mcg  1,000 mcg Oral Daily Thurnell Lose, MD   1,000 mcg at 01/09/21 7416     Discharge Medications: Please see discharge summary for a list of discharge medications.  Relevant Imaging Results:  Relevant Lab Results:   Additional Information #[id number], Rexene Alberts, RN

## 2021-01-09 NOTE — Progress Notes (Signed)
Patient ID: [name], male   DOB: 1929-02-11, 87 y.o.   MRN: [id number]     Advanced Heart Failure Rounding Note  PCP-Cardiologist: None   Subjective:    Underwent DC-CV on 10/21. Remains in NSR.   No dyspnea, says he feels ok.   Had skin tear on RUE.  Wound care has seen.   Now off milrinone. Co-ox 57%  CVP 7 (checked personally).  Creatinine stable at 2.14.   Objective:   Weight Range: 77.2 kg Body mass index is 21.85 kg/m.   Vital Signs:   Temp:  [97.1 F (36.2 C)-97.9 F (36.6 C)] 97.6 F (36.4 C) (10/24 0536) Pulse Rate:  [73-93] 93 (10/23 1931) Resp:  [18] 18 (10/24 0536) BP: (112-126)/(56-57) 126/57 (10/24 0536) SpO2:  [94 %] 94 % (10/24 0536) Weight:  [77.2 kg] 77.2 kg (10/24 0500) Last BM Date: 01/07/21  Weight change: Filed Weights   01/07/21 0221 01/08/21 0400 01/09/21 0500  Weight: 78.8 kg 77.1 kg 77.2 kg    Intake/Output:   Intake/Output Summary (Last 24 hours) at 01/09/2021 0846 Last data filed at 01/09/2021 0544 Gross per 24 hour  Intake 660 ml  Output 2150 ml  Net -1490 ml      Physical Exam    General: NAD Neck: JVP 7-8 cm, no thyromegaly or thyroid nodule.  Lungs: Clear to auscultation bilaterally with normal respiratory effort. CV: Nondisplaced PMI.  Heart regular S1/S2, no S3/S4, 2/6 SEM RUSB.  Trace ankle edema.  Abdomen: Soft, nontender, no hepatosplenomegaly, no distention.  Skin: Intact without lesions or rashes.  Neurologic: Alert and oriented x 3.  Psych: Normal affect. Extremities: No clubbing or cyanosis.  HEENT: Normal.    Telemetry   AV paced 70s. Personally reviewed   Labs    CBC Recent Labs    01/06/21 1109  WBC 8.9  NEUTROABS 6.5  HGB 8.4*  HCT 28.4*  MCV 84.0  PLT 518   Basic Metabolic Panel Recent Labs    01/08/21 0500 01/09/21 0330  NA 136 135  K 3.9 3.6  CL 102 101  CO2 26 26  GLUCOSE 81 139*  BUN 68* 68*  CREATININE 2.11* 2.14*  CALCIUM 8.5* 8.2*  MG 2.0  --    Liver Function  Tests No results for input(s): AST, ALT, ALKPHOS, BILITOT, PROT, ALBUMIN in the last 72 hours. No results for input(s): LIPASE, AMYLASE in the last 72 hours. Cardiac Enzymes No results for input(s): CKTOTAL, CKMB, CKMBINDEX, TROPONINI in the last 72 hours.  BNP: BNP (last 3 results) Recent Labs    01/04/21 0411 01/05/21 0350 01/06/21 1109  BNP 2,990.9* 1,925.1* 993.7*    ProBNP (last 3 results) No results for input(s): PROBNP in the last 8760 hours.   D-Dimer No results for input(s): DDIMER in the last 72 hours. Hemoglobin A1C No results for input(s): HGBA1C in the last 72 hours. Fasting Lipid Panel No results for input(s): CHOL, HDL, LDLCALC, TRIG, CHOLHDL, LDLDIRECT in the last 72 hours. Thyroid Function Tests No results for input(s): TSH, T4TOTAL, T3FREE, THYROIDAB in the last 72 hours.  Invalid input(s): FREET3  Other results:   Imaging    No results found.   Medications:     Scheduled Medications:  allopurinol  100 mg Oral Daily   amiodarone  200 mg Oral BID   apixaban  2.5 mg Oral BID   atorvastatin  40 mg Oral Daily   Chlorhexidine Gluconate Cloth  6 each Topical Daily   dapagliflozin propanediol  10 mg Oral QAC breakfast   digoxin  0.0625 mg Oral QODAY   escitalopram  10 mg Oral Daily   gabapentin  100 mg Oral BID   guaiFENesin  600 mg Oral BID   insulin aspart  0-9 Units Subcutaneous TID WC   insulin glargine-yfgn  30 Units Subcutaneous QHS   isosorbide mononitrate  15 mg Oral Daily   loratadine  10 mg Oral Daily   pramipexole  0.25 mg Oral Q24H   sodium chloride flush  10-40 mL Intracatheter Q12H   spironolactone  25 mg Oral Daily   tamsulosin  0.4 mg Oral QPC breakfast   torsemide  40 mg Oral Daily   vitamin B-12  1,000 mcg Oral Daily    Infusions:    PRN Medications: acetaminophen **OR** acetaminophen, guaiFENesin-dextromethorphan, lidocaine    Assessment/Plan   1. Acute on chronic systolic CHF: Ischemic cardiomyopathy.  Echo in  5/22 with EF 20-25%, low gradient moderate AS with mean gradient 12 mmHg and AVA 1.1. [employer] CRT-D device.  CHF exacerbation in setting of recurrent atrial fibrillation and loss of effective CRT.  Creatinine now trending back down,  2.14.  Milrinone stopped 10/22, Co-ox 57%.  - Continue torsemide 40 mg daily (on 20 mg daily at home).  - Can restart low dose digoxin 0.0625 mg qod (prior home dose).  - Continue Farxiga 10 mg daily.  - Continue spironolactone 25 daily.  - Hold Coreg and hydral/Imdur for now, SBP stable 100s-110s.  - Walk in hall.  2. Atrial fibrillation: Paroxysmal.  Went into AF on 10/9, likely triggered by COVID-19 infection.  Loss of effective CRT with AF.  Back in NSR after DC-CV on 10/21.  - Can switch amiodarone to 200 bid today.  - On Eliquis 2.5 bid.  3. AKI on CKD stage 4: Baseline creatinine around 2.1.  Creatinine 2.85 => 2.71=> 2.27 => 2.11 => 2.14 today.  4. COVID-19: Admitted initially 10/8 with COVID-19. Received monoclonal ab and remdesivir initially. Still with cough though CHF likely contributes to this.  - Now off precautions.  5. CAD: s/p CABG.  No chest pain.  HS-TnI minimally elevated with no trend, likely demand ischemia. No chest pain.  - Continue atorvastatin. - Not on ASA with stable CAD and apixaban use.  6. Skin tear on RUE  - consulted Wound Care  From my standpoint, he could go home tomorrow.  Close followup in CHF clinic.  Cardiac meds for discharge: dapagliflozin 10 mg daily, spironolactone 25 daily, amiodarone 200 mg bid x 10 days then 200 mg daily, digoxin 0.0625 mg every other day, torsemide 40 mg daily, atorvastatin 40 daily, Imdur 30 daily.    Length of Stay: C[place], MD  01/09/2021, 8:46 AM  Advanced Heart Failure Team Pager [phone] (M-F; 7a - 5p)  Please contact [facility] for night-coverage after hours (5p -7a ) and weekends on [email or web address]

## 2021-01-09 NOTE — Progress Notes (Signed)
Physical Therapy Treatment Patient Details Name: [name] MRN: [id number] DOB: 1928-08-26 Today's Date: 01/09/2021   History of Present Illness 87 y.o. [name] was admitted on 10/16 for a fall at home with pt hitting his head but cleared for fractures.  Had recent admission for positive covid test, still testing positive and SOB.  Has PVC's on telemetry, CHF exacerbation, LE edema. S/p DCCV 10/21. PMHx:  CAD s/p CABG, CKD stage IIIb, DM2, ischemic cardiomyopathy with systolic and diastolic congestive heart failure, moderate aortic stenosis, paroxysmal atrial fibrillation, HTN, gout.    PT Comments    Pt currently requiring assist with all mobility and with poor activity tolerance. Do not feel that wife can safely assist pt at this level and she will be limited with her own medical issues. Feel pt could benefit from SNF to increase pt's independence and safety prior to ultimate return home.    Recommendations for follow up therapy are one component of a multi-disciplinary discharge planning process, led by the attending physician.  Recommendations may be updated based on patient status, additional functional criteria and insurance authorization.  Follow Up Recommendations  Skilled nursing-short term rehab (<3 hours/day)     Assistance Recommended at Discharge Frequent or constant Supervision/Assistance  Equipment Recommendations  None recommended by PT    Recommendations for Other Services       Precautions / Restrictions Precautions Precautions: Fall     Mobility  Bed Mobility Overal bed mobility: Needs Assistance Bed Mobility: Supine to Sit;Sit to Supine     Supine to sit: Min guard;HOB elevated Sit to supine: Min guard;HOB elevated   General bed mobility comments: Assist for safety and lines    Transfers Overall transfer level: Needs assistance Equipment used: Rolling walker (2 wheels);Roolator (4 wheels) Transfers: Sit to/from Stand Sit to Stand: Min assist Stand  pivot transfers: Min assist         General transfer comment: Assist to bring hips up and for balance. Pt with significant forward flexion. Bed to [facility] with HHA with assist for balance and support and pt with small pivotal steps.    Ambulation/Gait Ambulation/Gait assistance: Min assist Gait Distance (Feet): 125 Feet Assistive device: Rollator (4 wheels) Gait Pattern/deviations: Step-through pattern;Decreased step length - left;Decreased stance time - right;Trunk flexed;Knee flexed in stance - left;Knee flexed in stance - right Gait velocity: decreased Gait velocity interpretation: <1.8 ft/sec, indicate of risk for recurrent falls General Gait Details: Assist for balance and support. Pt with significant kyphosis. Pt fatigued quickly and incr flexion in hips and knees as he fatigued   Stairs             Wheelchair Mobility    Modified Rankin (Stroke Patients Only)       Balance Overall balance assessment: Needs assistance;History of Falls Sitting-balance support: Feet supported;No upper extremity supported Sitting balance-Leahy Scale: Good     Standing balance support: Bilateral upper extremity supported;During functional activity Standing balance-Leahy Scale: Poor Standing balance comment: walker or rollator and min guard for static standing                            Cognition Arousal/Alertness: Awake/alert Behavior During Therapy: WFL for tasks assessed/performed Overall Cognitive Status: Within Functional Limits for tasks assessed  Exercises      General Comments General comments (skin integrity, edema, etc.): VSS on RA      Pertinent Vitals/Pain Pain Assessment: No/denies pain    Home Living                          Prior Function            PT Goals (current goals can now be found in the care plan section) Acute Rehab PT Goals Patient Stated Goal: go to rehab and  then home Progress towards PT goals: Not progressing toward goals - comment (fatigue and weakness)    Frequency    Min 3X/week      PT Plan Discharge plan needs to be updated;Frequency needs to be updated    Co-evaluation              AM-PAC PT "6 Clicks" Mobility   Outcome Measure  Help needed turning from your back to your side while in a flat bed without using bedrails?: A Little Help needed moving from lying on your back to sitting on the side of a flat bed without using bedrails?: A Little Help needed moving to and from a bed to a chair (including a wheelchair)?: A Little Help needed standing up from a chair using your arms (e.g., wheelchair or bedside chair)?: A Little Help needed to walk in hospital room?: A Little Help needed climbing 3-5 steps with a railing? : Total 6 Click Score: 16    End of Session   Activity Tolerance: Patient limited by fatigue Patient left: in bed;with bed alarm set;with call bell/phone within reach Nurse Communication: Mobility status;Other (comment) (informed nurse tech of BM) PT Visit Diagnosis: Muscle weakness (generalized) (M62.81);History of falling (Z91.81);Other abnormalities of gait and mobility (R26.89)     Time: 1224-8250 PT Time Calculation (min) (ACUTE ONLY): 30 min  Charges:  $Gait Training: 23-37 mins                     [place] Pager [phone] Office [place] 01/09/2021, 4:56 PM

## 2021-01-09 NOTE — Progress Notes (Signed)
CARDIAC REHAB PHASE I   PRE:  Rate/Rhythm: 70 paced  BP:  Sitting: 131/59      SaO2: 97 RA  MODE:  Ambulation: 125 ft   POST:  Rate/Rhythm: 88 paced  BP:  Sitting: 129/65    SaO2: 95 RA   Pt assisted off BSC and ambulated 143ft in hallway assist of one with front wheel walker and gait belt. Pt denies CP or SOB, does c/o significant weakness. Pt returned to recliner. Pt states wife can not help him physically at home,  , spoke with wife over the phone and she is in agreement. Requested PT eval for possible rehab placement. RN aware. Call bell and bedside table within reach. Will continue to follow.  [id number] Rufina Falco, RN BSN 01/09/2021 10:01 AM

## 2021-01-10 ENCOUNTER — Encounter (HOSPITAL_COMMUNITY): Payer: Self-pay | Admitting: Cardiology

## 2021-01-10 DIAGNOSIS — I5023 Acute on chronic systolic (congestive) heart failure: Secondary | ICD-10-CM | POA: Diagnosis not present

## 2021-01-10 DIAGNOSIS — I2510 Atherosclerotic heart disease of native coronary artery without angina pectoris: Secondary | ICD-10-CM | POA: Diagnosis not present

## 2021-01-10 DIAGNOSIS — N170 Acute kidney failure with tubular necrosis: Secondary | ICD-10-CM | POA: Diagnosis not present

## 2021-01-10 DIAGNOSIS — I480 Paroxysmal atrial fibrillation: Secondary | ICD-10-CM | POA: Diagnosis not present

## 2021-01-10 LAB — COOXEMETRY PANEL
Carboxyhemoglobin: 1.1 % (ref 0.5–1.5)
Methemoglobin: 0.9 % (ref 0.0–1.5)
O2 Saturation: 52.6 %
Total hemoglobin: 9.2 g/dL — ABNORMAL LOW (ref 12.0–16.0)

## 2021-01-10 LAB — BASIC METABOLIC PANEL
Anion gap: 7 (ref 5–15)
BUN: 63 mg/dL — ABNORMAL HIGH (ref 8–23)
CO2: 28 mmol/L (ref 22–32)
Calcium: 8.5 mg/dL — ABNORMAL LOW (ref 8.9–10.3)
Chloride: 99 mmol/L (ref 98–111)
Creatinine, Ser: 2.08 mg/dL — ABNORMAL HIGH (ref 0.61–1.24)
GFR, Estimated: 30 mL/min — ABNORMAL LOW (ref >60)
Glucose, Bld: 181 mg/dL — ABNORMAL HIGH (ref 70–99)
Potassium: 4 mmol/L (ref 3.5–5.1)
Sodium: 134 mmol/L — ABNORMAL LOW (ref 135–145)

## 2021-01-10 LAB — GLUCOSE, CAPILLARY
Glucose-Capillary: 208 mg/dL — ABNORMAL HIGH (ref 70–99)
Glucose-Capillary: 153 mg/dL — ABNORMAL HIGH (ref 70–99)
Glucose-Capillary: 299 mg/dL — ABNORMAL HIGH (ref 70–99)
Glucose-Capillary: 348 mg/dL — ABNORMAL HIGH (ref 70–99)

## 2021-01-10 MED ORDER — AMIODARONE LOAD VIA INFUSION
150.0000 mg | Freq: Once | INTRAVENOUS | Status: AC
  Administered 2021-01-10: 150 mg via INTRAVENOUS
  Filled 2021-01-10: qty 83.34

## 2021-01-10 MED ORDER — AMIODARONE HCL IN DEXTROSE 360-4.14 MG/200ML-% IV SOLN
60.0000 mg/h | INTRAVENOUS | Status: DC
  Administered 2021-01-10 (×2): 60 mg/h via INTRAVENOUS
  Filled 2021-01-10 (×2): qty 200

## 2021-01-10 MED ORDER — INSULIN ASPART 100 UNIT/ML IJ SOLN
0.0000 [IU] | Freq: Every day | INTRAMUSCULAR | Status: DC
  Administered 2021-01-10: 4 [IU] via SUBCUTANEOUS
  Administered 2021-01-11: 3 [IU] via SUBCUTANEOUS
  Administered 2021-01-12: 5 [IU] via SUBCUTANEOUS

## 2021-01-10 MED ORDER — INSULIN ASPART 100 UNIT/ML IJ SOLN
0.0000 [IU] | Freq: Three times a day (TID) | INTRAMUSCULAR | Status: DC
  Administered 2021-01-10: 8 [IU] via SUBCUTANEOUS
  Administered 2021-01-11: 3 [IU] via SUBCUTANEOUS
  Administered 2021-01-12: 2 [IU] via SUBCUTANEOUS

## 2021-01-10 MED ORDER — INSULIN ASPART 100 UNIT/ML IJ SOLN: 0.0000 [IU] | Freq: Three times a day (TID) | INTRAMUSCULAR | Status: DC

## 2021-01-10 MED ORDER — AMIODARONE HCL IN DEXTROSE 360-4.14 MG/200ML-% IV SOLN
30.0000 mg/h | INTRAVENOUS | Status: DC
  Administered 2021-01-10 – 2021-01-12 (×4): 30 mg/h via INTRAVENOUS
  Filled 2021-01-10 (×3): qty 200

## 2021-01-10 NOTE — TOC Progression Note (Addendum)
Transition of Care [facility]) - Progression Note    Patient Details  Name: [name] MRN: [id number] Date of Birth: 1928-10-23  Transition of Care [facility]) CM/SW [place], [place] Phone Number: 01/10/2021, 10:16 AM  Clinical Narrative:    [facility] CSW reached out to [name] daughter, [name] [phone] to discuss the SNF bed offers extended and emailed her a list of the offers and the Medicare.gov website for review. CSW will reach back out later to see which SNF they have decided on. CSW heard back from [name] daughter [name] who reported that [name] would be the first choice for rehab and then [name] would be second. CSW reached out to [facility] to see about a bed offer and is awaiting a response from Star, the admission coordinator. Star reported that she can take [name] at [facility] for rehab.  CSW will continue to follow throughout discharge.   Expected Discharge Plan: [place] Barriers to Discharge: Continued Medical Work up  Expected Discharge Plan and Services Expected Discharge Plan: [place] In-house Referral: Clinical Social Work Discharge Planning Services: CM Consult Post Acute Care Choice: [place] arrangements for the past 2 months: Single Family Home                                       Social Determinants of Health (SDOH) Interventions    Readmission Risk Interventions No flowsheet data found.  Leela Vanbrocklin, MSW, [place] Heart Failure Social Worker

## 2021-01-10 NOTE — H&P (View-Only) (Signed)
Patient ID: [name], male   DOB: 04-Jul-1928, 87 y.o.   MRN: [id number]     Advanced Heart Failure Rounding Note  PCP-Cardiologist: None   Subjective:    Appears to be back in Afib w/ RVR, rates 110s-120s. Asymptomatic.   Co-ox 53% today (stable). SCr remains stable at 2.1. CVP 10   Denies dyspnea. No palpitations. +dry cough.   Awaking SNF placement.   Objective:   Weight Range: 78 kg Body mass index is 22.08 kg/m.   Vital Signs:   Temp:  [97.7 F (36.5 C)-98.3 F (36.8 C)] 98.3 F (36.8 C) (10/25 0400) Pulse Rate:  [65-120] 109 (10/25 0000) Resp:  [16-18] 16 (10/25 0400) BP: (110-121)/(66-81) 112/71 (10/25 0400) SpO2:  [94 %-97 %] 94 % (10/25 0400) Weight:  [78 kg] 78 kg (10/25 0400) Last BM Date: 01/07/21  Weight change: Filed Weights   01/08/21 0400 01/09/21 0500 01/10/21 0400  Weight: 77.1 kg 77.2 kg 78 kg    Intake/Output:   Intake/Output Summary (Last 24 hours) at 01/10/2021 0757 Last data filed at 01/10/2021 0400 Gross per 24 hour  Intake 712 ml  Output 1900 ml  Net -1188 ml      Physical Exam   CVP 10  General:  elderly WM sitting up in bed. No respiratory difficulty HEENT: normal Neck: supple. JVD 9 cm. Carotids 2+ bilat; no bruits. No lymphadenopathy or thyromegaly appreciated. Cor: PMI nondisplaced. Irregularly irregular rhythm, tachy rate. 2/6 SEM  Lungs: clear Abdomen: soft, nontender, nondistended. No hepatosplenomegaly. No bruits or masses. Good bowel sounds. Extremities: no cyanosis, clubbing, rash, trace bilateral LE edema, Rt forearm wrapped Neuro: alert & oriented x 3, cranial nerves grossly intact. moves all 4 extremities w/o difficulty. Affect pleasant.  Telemetry   Afib 110s-120s Personally reviewed   Labs    CBC Recent Labs    01/09/21 1658  WBC 11.7*  HGB 9.4*  HCT 32.1*  MCV 83.2  PLT 536   Basic Metabolic Panel Recent Labs    01/08/21 0500 01/09/21 0330 01/10/21 0450  NA 136 135 134*  K 3.9 3.6 4.0   CL 102 101 99  CO2 26 26 28   GLUCOSE 81 139* 181*  BUN 68* 68* 63*  CREATININE 2.11* 2.14* 2.08*  CALCIUM 8.5* 8.2* 8.5*  MG 2.0  --   --    Liver Function Tests No results for input(s): AST, ALT, ALKPHOS, BILITOT, PROT, ALBUMIN in the last 72 hours. No results for input(s): LIPASE, AMYLASE in the last 72 hours. Cardiac Enzymes No results for input(s): CKTOTAL, CKMB, CKMBINDEX, TROPONINI in the last 72 hours.  BNP: BNP (last 3 results) Recent Labs    01/04/21 0411 01/05/21 0350 01/06/21 1109  BNP 2,990.9* 1,925.1* 993.7*    ProBNP (last 3 results) No results for input(s): PROBNP in the last 8760 hours.   D-Dimer No results for input(s): DDIMER in the last 72 hours. Hemoglobin A1C No results for input(s): HGBA1C in the last 72 hours. Fasting Lipid Panel No results for input(s): CHOL, HDL, LDLCALC, TRIG, CHOLHDL, LDLDIRECT in the last 72 hours. Thyroid Function Tests No results for input(s): TSH, T4TOTAL, T3FREE, THYROIDAB in the last 72 hours.  Invalid input(s): FREET3  Other results:   Imaging    No results found.   Medications:     Scheduled Medications:  allopurinol  100 mg Oral Daily   amiodarone  200 mg Oral BID   apixaban  2.5 mg Oral BID   atorvastatin  40 mg  Oral Daily   Chlorhexidine Gluconate Cloth  6 each Topical Daily   dapagliflozin propanediol  10 mg Oral QAC breakfast   [START ON 01/11/2021] digoxin  0.0625 mg Oral Q M,W,F   escitalopram  10 mg Oral Daily   gabapentin  100 mg Oral BID   guaiFENesin  600 mg Oral BID   insulin aspart  0-9 Units Subcutaneous TID WC   insulin glargine-yfgn  30 Units Subcutaneous QHS   isosorbide mononitrate  15 mg Oral Daily   loratadine  10 mg Oral Daily   pramipexole  0.25 mg Oral Q24H   sodium chloride flush  10-40 mL Intracatheter Q12H   spironolactone  25 mg Oral Daily   tamsulosin  0.4 mg Oral QPC breakfast   torsemide  40 mg Oral Daily   vitamin B-12  1,000 mcg Oral Daily     Infusions:    PRN Medications: acetaminophen **OR** acetaminophen, guaiFENesin-dextromethorphan, lidocaine    Assessment/Plan   1. Acute on chronic systolic CHF: Ischemic cardiomyopathy.  Echo in 5/22 with EF 20-25%, low gradient moderate AS with mean gradient 12 mmHg and AVA 1.1. [employer] CRT-D device.  CHF exacerbation in setting of recurrent atrial fibrillation and loss of effective CRT.  Creatinine now trending back down and stable at 2.1.  Milrinone stopped 10/22, Co-ox 56%.  - Continue torsemide 40 mg daily (on 20 mg daily at home).  - Continue digoxin 0.0625 mg qMWF - Continue Farxiga 10 mg daily.  - Continue spironolactone 25 daily.  - Hold Coreg and hydral/Imdur for now, SBP stable 100s-110s.  2. Atrial fibrillation: Paroxysmal.  Went into AF on 10/9, likely triggered by COVID-19 infection.  Loss of effective CRT with AF.  Back in NSR after DC-CV on 10/21. Back in Afib 10/25 - Restart IV amiodarone  - On Eliquis 2.5 bid.  3. AKI on CKD stage 4: Baseline creatinine around 2.1.  Creatinine 2.85 => 2.71=> 2.27 => 2.11 => 2.14=>2.08 today.  4. COVID-19: Admitted initially 10/8 with COVID-19. Received monoclonal ab and remdesivir initially. Still with cough though CHF likely contributes to this.  - Now off precautions.  5. CAD: s/p CABG.  No chest pain.  HS-TnI minimally elevated with no trend, likely demand ischemia. No chest pain.  - Continue atorvastatin. - Not on ASA with stable CAD and apixaban use.  6. Skin tear on RUE  -  Wound Care consulted. Beside RN to manage 7. Deconditioning - Work w/ PT - Needs SNF. SW assisting w/ placement   Length of Stay: [address] PA-C  01/10/2021, 7:57 AM  Advanced Heart Failure Team Pager [phone] (M-F; 7a - 5p)  Please contact [facility] for night-coverage after hours (5p -7a ) and weekends on [email or web address]   Patient seen with PA, agree with the above note.   He went back into atrial fibrillation overnight,  controlled rate.  Co-ox marginal at 53%.  CVP 7 on my measurement.    He denies dyspnea.   General: NAD Neck: JVP 7-8 cm, no thyromegaly or thyroid nodule.  Lungs: Clear to auscultation bilaterally with normal respiratory effort. CV: Nondisplaced PMI.  Heart irregular S1/S2, no S3/S4, no murmur.  No peripheral edema.   Abdomen: Soft, nontender, no hepatosplenomegaly, no distention.  Skin: Intact without lesions or rashes.  Neurologic: Alert and oriented x 3.  Psych: Normal affect. Extremities: No clubbing or cyanosis.  HEENT: Normal.   Recurrent atrial fibrillation.  Restart amiodarone gtt today.  Will plan DCCV tomorrow  morning if he does not convert to NSR on his own. Discussed risks/benefits with patient, he agrees to procedure.   Volume status looks ok, creatinine lower today 2.08.  Continue torsemide 40 mg daily.   [name] 01/10/2021 9:56 AM

## 2021-01-10 NOTE — Progress Notes (Signed)
Pt. Was seen to assess pt. Ability to care for himself. Pt. Is requiring assist with ADLs and with mobility. Pt. Has very low tolerance for activity. Pt. Needs assist with all care currently. Pt. Is an agreement that he can not safely go home at this time. He states he wife can not physically assist him. Acute OT to follow.   01/10/21 0900  OT Visit Information  Last OT Received On 01/10/21  Assistance Needed +1  History of Present Illness 87 y.o. [name] was admitted on 10/16 for a fall at home with pt hitting his head but cleared for fractures.  Had recent admission for positive covid test, still testing positive and SOB.  Has PVC's on telemetry, CHF exacerbation, LE edema. S/p DCCV 10/21. PMHx:  CAD s/p CABG, CKD stage IIIb, DM2, ischemic cardiomyopathy with systolic and diastolic congestive heart failure, moderate aortic stenosis, paroxysmal atrial fibrillation, HTN, gout.  Precautions  Precautions Fall  Precaution Comments monitor O2 and HR  Restrictions  Weight Bearing Restrictions No  Pain Assessment  Pain Assessment No/denies pain  Cognition  Arousal/Alertness Awake/alert  Behavior During Therapy WFL for tasks assessed/performed  Overall Cognitive Status Within Functional Limits for tasks assessed  Upper Extremity Assessment  RUE Deficits / Details pt. is not able to lift against gravity  RUE Sensation WNL  RUE Coordination WNL  Lower Extremity Assessment  Lower Extremity Assessment Defer to PT evaluation  Vision- Assessment  Vision Assessment? No apparent visual deficits  ADL  Overall ADL's  Needs assistance/impaired  Eating/Feeding Independent;Sitting  Grooming Set up;Standing;Wash/dry face;Oral care;Brushing hair  Grooming Details (indicate cue type and reason) no LOB standing at sink, noted with progressive trunk flexion with prolonged standing at sink  Upper Body Bathing Set up;Sitting  Lower Body Bathing Min guard;Sit to/from stand  Upper Body Dressing  Set up;Sitting  Lower  Body Dressing Min guard;Sit to/from stand  Lower Body Dressing Details (indicate cue type and reason) able to pull up B socks via figure four position  Toilet Transfer Min guard;Ambulation  Toileting- Forensic psychologist Min guard;Sit to/from stand  Functional mobility during ADLs Min guard;Rolling walker (2 wheels)  General ADL Comments Pt. stood at sink for grooming 4 min.  Bed Mobility  Overal bed mobility Needs Assistance  Bed Mobility Supine to Sit;Sit to Supine  Supine to sit Supervision;HOB elevated  Sit to supine Supervision  Transfers  Overall transfer level Needs assistance  Equipment used Rolling walker (2 wheels);Roolator (4 wheels)  Transfers Sit to/from Stand  Sit to [employer] guard  Stand pivot transfers Min guard  General transfer comment cues for hand placement.  Balance  Sitting balance-Leahy Scale Good  Standing balance-Leahy Scale Fair  OT - End of Session  Equipment Utilized During Treatment Rolling walker (2 wheels)  Activity Tolerance Patient tolerated treatment well  Patient left in chair;with call bell/phone within reach;with chair alarm set  Nurse Communication  (ok therapy)  OT Assessment/Plan  OT Plan Discharge plan remains appropriate  OT Visit Diagnosis Unsteadiness on feet (R26.81);Other abnormalities of gait and mobility (R26.89);History of falling (Z91.81);Muscle weakness (generalized) (M62.81)  OT Frequency (ACUTE ONLY) Min 2X/week  Follow Up Recommendations Skilled nursing-short term rehab (<3 hours/day) (Pt. states his wife is on a walker and she can not assist him.)  Assistance recommended at discharge Other (comment)  OT Equipment None recommended by OT  AM-PAC OT "6 Clicks" Daily Activity Outcome Measure (Version 2)  Help from another person eating meals? 4  Help from  another person taking care of personal grooming? 3  Help from another person toileting, which includes using toliet, bedpan, or urinal? 3  Help from another  person bathing (including washing, rinsing, drying)? 3  Help from another person to put on and taking off regular upper body clothing? 3  Help from another person to put on and taking off regular lower body clothing? 3  6 Click Score 19  Progressive Mobility  What is the highest level of mobility based on the progressive mobility assessment? Level 4 (Walks with assist in room) - Balance while marching in place and cannot step forward and back - Complete  Mobility Out of bed to chair with meals;Ambulated with assistance in room  OT Goal Progression  Progress towards OT goals Progressing toward goals  Acute Rehab OT Goals  OT Goal Formulation With patient  Time For Goal Achievement 01/17/21  Potential to Achieve Goals Good  ADL Goals  Pt Will Perform Lower Body Bathing with modified independence;sit to/from stand  Pt Will Perform Lower Body Dressing with modified independence;sit to/from stand  Pt Will Transfer to Toilet with modified independence;ambulating  Pt Will Perform Tub/Shower Transfer Tub transfer;with modified independence;ambulating;rolling walker;shower seat  Additional ADL Goal #1 Pt to verbalize at least 3 fall prevention strategies for home environment  Additional ADL Goal #2 Pt to improve standing activity tolerance to > 15 min to maximize endurance for ADL/IADL tasks  OT Time Calculation  OT Start Time (ACUTE ONLY) 0925  OT Stop Time (ACUTE ONLY) 1003  OT Time Calculation (min) 38 min  OT General Charges  $OT Visit 1 Visit  OT Treatments  $Self Care/Home Management  23-37 mins  $Therapeutic Activity 8-22 mins  Reece Packer OT/L

## 2021-01-10 NOTE — Progress Notes (Signed)
[facility]                                                                              Patient Demographics  [name], is a 87 y.o. male, DOB - 26-May-1928, ZOX:[id number]  Admit date - 01/01/2021   Admitting Physician Orma Flaming, MD  Outpatient Primary MD for the patient is Panosh, Standley Brooking, MD  Outpatient specialists:   LOS - 9  days   Medical records reviewed and are as summarized below:    Chief Complaint  Patient presents with   Shortness of Breath   Dizziness   Leg Swelling       Brief summary   Patient is a 87 y.o. 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  Recently discharged on 12/27/2020 for CHF exacerbation and has continued to become progressively more short of breath since this time, he developed shortness of breath along with some dry cough, work-up suggestive of bronchitis along with underlying CHF.  Patient was admitted for further work-up.  10/25: Patient was significantly improving from cardiac standpoint and heart failure however went back into atrial fibrillation with RVR today, IV amiodarone resumed Plan to DC to SNF when cleared by cardiology  Assessment & Plan    Principal Problem: Acute respiratory failure with hypoxia  Acute on chronic systolic CHF (congestive heart failure) ([place]), ischemic cardiomyopathy -Initially placed on IV Lasix and milrinone, now has been transitioned to oral torsemide 40 mg daily  (on 20 mg daily at home) -Cardiology/CHF team following.  Continue digoxin, Farxiga, Aldactone 25 mg daily. -BP had been soft in low 100s, Coreg, hydralazine has been held. -PT OT recommending SNF for rehab before returning home   Active Problems: Paroxysmal atrial fibrillation with RVR -Patient underwent DCCV on  10/21, remained in normal sinus rhythm, until back in A. fib today 10/25 -IV amiodarone resumed -Continue Eliquis 2.5 mg p.o. twice daily  Acute bronchitis -Completed doxycycline  -continue Mucinex flutter valve and supportive care -Currently stable, no wheezing   Recent COVID-19 infection -Patient had tested positive on 12/24/2020 and had received monoclonal antibody infusion during the last hospitalization -No acute issues, has been off isolation  Acute kidney injury superimposed on stage IIIb CKD -Baseline creatinine 2.15 -Creatinine around baseline 2.0  Essential hypertension -BP has been stable now, continue to hold Coreg, hydralazine.  H&H stable  Obstructive sleep apnea -Continue CPAP  Iron deficiency anemia Units stable  Restless leg syndrome -Continue Mirapex  Diabetes mellitus type 2, IDDM uncontrolled with hyperglycemia, CKD 3B -CBGs elevated, in 200s -Change sliding scale to moderate, continue Semglee 30 units at bedtime, continue [place]    01/09/21 0615 01/09/21 1036 01/09/21 1706 01/09/21 2140 01/10/21 0635 01/10/21 1114  GLUCAP 82 157* 222* 230* 208* 153*    Pressure injury, left arm anterior upper skin tear -Present on admission, wound care per nursing Right upper extremity skin tear, wound care was consulted  Code Status: DNR DVT Prophylaxis:  Place TED hose Start: 01/02/21 1518 apixaban (ELIQUIS) tablet 2.5 mg Start: 01/01/21 2200 apixaban (ELIQUIS) tablet 2.5 mg   Level of Care: Level of care: Telemetry Cardiac Family Communication: Discussed all imaging results, lab results, explained to the patient's wife on the phone on 10/23.    Disposition Plan:     Status is: Inpatient  Remains inpatient appropriate because: Plan for discharge to skilled nursing facility when bed available  Time Spent in minutes 25 minutes  Procedures:  Cardioversion on 10/21  Consultants:   Cardiology  Antimicrobials:   Anti-infectives  (From admission, onward)    Start     Dose/Rate Route Frequency Ordered Stop   01/03/21 1000  azithromycin (ZITHROMAX) tablet 250 mg  Status:  Discontinued        250 mg Oral Daily 01/02/21 1049 01/02/21 1119   01/02/21 1130  doxycycline (VIBRA-TABS) tablet 100 mg        100 mg Oral Every 12 hours 01/02/21 1119 01/06/21 2034   01/02/21 1100  azithromycin (ZITHROMAX) tablet 500 mg  Status:  Discontinued        500 mg Oral  Once 01/02/21 1048 01/02/21 1119          Medications  Scheduled Meds:  allopurinol  100 mg Oral Daily   apixaban  2.5 mg Oral BID   atorvastatin  40 mg Oral Daily   Chlorhexidine Gluconate Cloth  6 each Topical Daily   dapagliflozin propanediol  10 mg Oral QAC breakfast   [START ON 01/11/2021] digoxin  0.0625 mg Oral Q M,W,F   escitalopram  10 mg Oral Daily   gabapentin  100 mg Oral BID   guaiFENesin  600 mg Oral BID   insulin aspart  0-9 Units Subcutaneous TID WC   insulin glargine-yfgn  30 Units Subcutaneous QHS   isosorbide mononitrate  15 mg Oral Daily   loratadine  10 mg Oral Daily   pramipexole  0.25 mg Oral Q24H   sodium chloride flush  10-40 mL Intracatheter Q12H   spironolactone  25 mg Oral Daily   tamsulosin  0.4 mg Oral QPC breakfast   torsemide  40 mg Oral Daily   vitamin B-12  1,000 mcg Oral Daily   Continuous Infusions:  amiodarone      PRN Meds:.acetaminophen **OR** acetaminophen, guaiFENesin-dextromethorphan, lidocaine      Subjective:   [name] was seen and examined today.  Back in A. fib today, IV amiodarone started.  Feels weak, no chest pain or acute shortness of breath.  No dizziness or palpitations.   Objective:   Vitals:   01/10/21 0803 01/10/21 1026 01/10/21 1135 01/10/21 1403  BP: 103/70 112/76 117/61   Pulse: (!) 46 (!) 110 (!) 121 (!) 102  Resp: 17  20   Temp: (!) 97.5 F (36.4 C)  98 F (36.7 C)   TempSrc: Oral  Oral   SpO2: 98% 96% 98%   Weight:      Height:        Intake/Output Summary (Last  24 hours) at 01/10/2021 1606 Last data filed at 01/10/2021 1501 Gross per 24 hour  Intake 1036 ml  Output 2500 ml  Net -1464 ml     Wt Readings from Last 3 Encounters:  01/10/21 78 kg  12/27/20 76.9 kg  11/17/20 77.5 kg    Physical Exam General: Alert and oriented x 3, NAD Cardiovascular: Irregularly irregular, tachycardia bilateral Respiratory: CTAB, no wheezing, rales or rhonchi Gastrointestinal: Soft,  nontender, nondistended, NBS Ext: lower extremity in heel protectors Neuro: no new deficits  Data Reviewed:  I have personally reviewed following labs and imaging studies  Micro Results Recent Results (from the past 240 hour(s))  Resp Panel by RT-PCR (Flu A&B, Covid) Nasopharyngeal Swab     Status: Abnormal   Collection Time: 01/01/21  2:34 PM   Specimen: Nasopharyngeal Swab; Nasopharyngeal(NP) swabs in vial transport medium  Result Value Ref Range Status   SARS Coronavirus 2 by RT PCR POSITIVE (A) NEGATIVE Final    Comment: RESULT CALLED TO, READ BACK BY AND VERIFIED WITH: RN J.COOK ON [id number] AT 1650 BY E.PARRISH (NOTE) SARS-CoV-2 target nucleic acids are DETECTED.  The SARS-CoV-2 RNA is generally detectable in upper respiratory specimens during the acute phase of infection. Positive results are indicative of the presence of the identified virus, but do not rule out bacterial infection or co-infection with other pathogens not detected by the test. Clinical correlation with patient history and other diagnostic information is necessary to determine patient infection status. The expected result is Negative.  Fact Sheet for Patients: EntrepreneurPulse.com.au  Fact Sheet for Healthcare Providers: IncredibleEmployment.be  This test is not yet approved or cleared by the [place] FDA and  has been authorized for detection and/or diagnosis of SARS-CoV-2 by FDA under an Emergency Use Authorization (EUA).  This EUA will remain in  effect (meaning this te st can be used) for the duration of  the COVID-19 declaration under Section 564(b)(1) of the Act, 21 U.S.C. section 360bbb-3(b)(1), unless the authorization is terminated or revoked sooner.     Influenza A by PCR NEGATIVE NEGATIVE Final   Influenza B by PCR NEGATIVE NEGATIVE Final    Comment: (NOTE) The Xpert Xpress SARS-CoV-2/FLU/RSV plus assay is intended as an aid in the diagnosis of influenza from Nasopharyngeal swab specimens and should not be used as a sole basis for treatment. Nasal washings and aspirates are unacceptable for Xpert Xpress SARS-CoV-2/FLU/RSV testing.  Fact Sheet for Patients: EntrepreneurPulse.com.au  Fact Sheet for Healthcare Providers: IncredibleEmployment.be  This test is not yet approved or cleared by the [place] FDA and has been authorized for detection and/or diagnosis of SARS-CoV-2 by FDA under an Emergency Use Authorization (EUA). This EUA will remain in effect (meaning this test can be used) for the duration of the COVID-19 declaration under Section 564(b)(1) of the Act, 21 U.S.C. section 360bbb-3(b)(1), unless the authorization is terminated or revoked.  Performed at [facility], [place] [address], [place], [place] [address]     Radiology Reports CT Head Wo Contrast  Result Date: 01/01/2021 CLINICAL DATA:  Head trauma, mod-severe fall, hematoma to crown of head EXAM: CT HEAD WITHOUT CONTRAST TECHNIQUE: Contiguous axial images were obtained from the base of the skull through the vertex without intravenous contrast. COMPARISON:  12/26/2020 FINDINGS: Brain: There is atrophy and chronic small vessel disease changes. No acute intracranial abnormality. Specifically, no hemorrhage, hydrocephalus, mass lesion, acute infarction, or significant intracranial injury. Vascular: No hyperdense vessel or unexpected calcification. Skull: No acute calvarial abnormality. Sinuses/Orbits: No acute  findings Other: None IMPRESSION: Atrophy, chronic microvascular disease. No acute intracranial abnormality. Electronically Signed   By: Rolm Baptise M.D.   On: 01/01/2021 16:43   CT HEAD WO CONTRAST (5MM)  Result Date: 12/26/2020 CLINICAL DATA:  Head and neck trauma EXAM: CT HEAD WITHOUT CONTRAST TECHNIQUE: Contiguous axial images were obtained from the base of the skull through the vertex without intravenous contrast. COMPARISON:  12/16/2009 FINDINGS: Brain: Progressive  confluent hypodensities throughout the periventricular white matter consistent with chronic small vessel ischemic changes. No signs of acute infarct or hemorrhage. Lateral ventricles and midline structures are unremarkable. No acute extra-axial fluid collections. No mass effect. Vascular: Prominent atherosclerosis of the internal carotid arteries. No hyperdense vessel. Skull: Small right parietal scalp hematoma. No underlying fracture. Negative for fracture or focal lesion. Sinuses/Orbits: Mild mucosal thickening within the ethmoid and sphenoid sinuses. Other: None. IMPRESSION: 1. No acute intracranial process. 2. Small right parietal scalp hematoma. Electronically Signed   By: Randa Ngo M.D.   On: 12/26/2020 21:26   CT CERVICAL SPINE WO CONTRAST  Result Date: 12/26/2020 CLINICAL DATA:  Head and neck trauma EXAM: CT CERVICAL SPINE WITHOUT CONTRAST TECHNIQUE: Multidetector CT imaging of the cervical spine was performed without intravenous contrast. Multiplanar CT image reconstructions were also generated. COMPARISON:  None. FINDINGS: Alignment: Alignment is grossly anatomic. Skull base and vertebrae: No acute fracture. No primary bone lesion or focal pathologic process. Soft tissues and spinal canal: No prevertebral fluid or swelling. No visible canal hematoma. 2.3 x 2.0 x 2.5 cm hypodense nodule arises from the lower pole of the left lobe thyroid. Disc levels: Mild diffuse facet hypertrophy. Disc spaces are well preserved. Upper  chest: Central airway is patent.  Lung apices are clear. Other: Reconstructed images demonstrate no additional findings. IMPRESSION: 1. No acute cervical spine fracture. 2. Incidental 2.5 cm left lobe thyroid nodule. Recommend nonemergent thyroid US if clinically warranted given patient age. Electronically Signed   By: Randa Ngo M.D.   On: 12/26/2020 21:31   DG CHEST PORT 1 VIEW  Result Date: 01/07/2021 CLINICAL DATA:  Shortness of breath, weakness and dizziness. EXAM: PORTABLE CHEST 1 VIEW COMPARISON:  01/04/2021. FINDINGS: Previous median sternotomy and CABG. Chronic cardiomegaly. Pacemaker/AICD remains in place. There is pulmonary venous hypertension with mild interstitial edema. Small pleural effusions with some basilar atelectasis, left more than right. Findings appear slightly worse than the study of 3 days ago. IMPRESSION: Congestive heart failure, mildly worsened since the study of 3 days ago. Electronically Signed   By: Nelson Chimes M.D.   On: 01/07/2021 11:11   DG Chest Port 1 View  Result Date: 01/04/2021 CLINICAL DATA:  S/P PICC central line placement EXAM: PORTABLE CHEST - 1 VIEW COMPARISON:  01/03/2019 FINDINGS: Right arm PICC line to the cavoatrial junction. Stable left subclavian AICD. Coarse airspace opacities at the left lung base, obscuring the left diaphragmatic leaflet. Heart size upper limits normal. Aortic Atherosclerosis (ICD10-170.0). CABG markers. Blunting of left lateral costophrenic angle suggesting small effusion. No pneumothorax. Right shoulder DJD. IMPRESSION: Right arm PICC line to the cavoatrial junction. Coarse left lower lobe airspace opacities. Electronically Signed   By: Lucrezia Europe M.D.   On: 01/04/2021 12:18   DG Chest Port 1 View  Result Date: 01/02/2021 CLINICAL DATA:  Reason for exam: sob Covid + Patient reports sob without any chest pains. Hx of afib, chf, diabetes, htn, prior MI 2005. Hx of defibrillator, cabg x3 2005. EXAM: PORTABLE CHEST - 1 VIEW  COMPARISON:  01/01/2021 FINDINGS: Left subclavian AICD stable. Relatively low lung volumes with some crowding of perihilar and bibasilar bronchovascular structures. No confluent airspace disease. Stable borderline cardiomegaly. CABG markers. Aortic Atherosclerosis (ICD10-170.0). No effusion.  No pneumothorax. Sternotomy wires. Right shoulder DJD. Cholecystectomy clips. vertebral endplate spurring at multiple levels in the lower thoracic spine. IMPRESSION: Low volumes.  Stable chronic and postop changes as above. Electronically Signed   By: Eden Emms.D.  On: 01/02/2021 07:33   DG Chest Portable 1 View  Result Date: 01/01/2021 CLINICAL DATA:  Shortness of breath EXAM: PORTABLE CHEST 1 VIEW COMPARISON:  12/27/2020 FINDINGS: Left AICD remains in place, unchanged. Prior CABG. Cardiomegaly, vascular congestion. No overt edema. No confluent opacities or effusions. IMPRESSION: Cardiomegaly, vascular congestion. Electronically Signed   By: Rolm Baptise M.D.   On: 01/01/2021 15:46   DG CHEST PORT 1 VIEW  Result Date: 12/27/2020 CLINICAL DATA:  Shortness of breath, COVID EXAM: PORTABLE CHEST 1 VIEW COMPARISON:  12/25/2020 FINDINGS: Cardiomegaly with left chest multi lead pacer defibrillator. Prominent pulmonary vasculature without acute airspace opacity. Possible trace pleural effusions and or pleural thickening. IMPRESSION: Cardiomegaly and pulmonary vascular congestion. No acute airspace opacity. Possible trace pleural effusions and or pleural thickening. Electronically Signed   By: Delanna Ahmadi M.D.   On: 12/27/2020 10:51   DG Chest Portable 1 View  Result Date: 12/25/2020 CLINICAL DATA:  COVID positive. Shortness of breath. EXAM: PORTABLE CHEST 1 VIEW COMPARISON:  January 2020 FINDINGS: Cardiac pacemaker and postsurgical changes are stable. Enlarged cardiac silhouette. Calcific atherosclerotic disease and tortuosity of the aorta. Subtle patchy areas of airspace consolidation in the lung bases. Osseous  structures are without acute abnormality. Soft tissues are grossly normal. IMPRESSION: Subtle patchy areas of airspace consolidation in the lung bases may represent early atypical pneumonia. Electronically Signed   By: Fidela Salisbury M.D.   On: 12/25/2020 18:09   VAS [place] LOWER EXTREMITY VENOUS (DVT)  Result Date: 12/27/2020  Lower Venous DVT Study Patient Name:  ALEKS NAWROT  Date of Exam:   12/27/2020 Medical Rec #: [id number]        Accession #:    [id number] Date of Birth: September 14, 1928        Patient Gender: M Patient Age:   61 years Exam Location:  [facility] Procedure:      VAS [place] LOWER EXTREMITY VENOUS (DVT) Referring Phys: A POWELL JR --------------------------------------------------------------------------------  Indications: Left > right LE edema.  Risk Factors: COVID-19 +. Comparison Study: No prior study Performing Technologist: Maudry Mayhew MHA, RDMS, RVT, RDCS  Examination Guidelines: A complete evaluation includes B-mode imaging, spectral Doppler, color Doppler, and power Doppler as needed of all accessible portions of each vessel. Bilateral testing is considered an integral part of a complete examination. Limited examinations for reoccurring indications may be performed as noted. The reflux portion of the exam is performed with the patient in reverse Trendelenburg.  +-----+---------------+---------+-----------+----------+--------------+ RIGHTCompressibilityPhasicitySpontaneityPropertiesThrombus Aging +-----+---------------+---------+-----------+----------+--------------+ CFV  Full           Yes      Yes                                 +-----+---------------+---------+-----------+----------+--------------+   +---------+---------------+---------+-----------+----------+--------------+ LEFT     CompressibilityPhasicitySpontaneityPropertiesThrombus Aging +---------+---------------+---------+-----------+----------+--------------+ CFV      Full           Yes       Yes                                 +---------+---------------+---------+-----------+----------+--------------+ SFJ      Full                                                        +---------+---------------+---------+-----------+----------+--------------+  FV Prox  Full                                                        +---------+---------------+---------+-----------+----------+--------------+ FV Mid   Full                                                        +---------+---------------+---------+-----------+----------+--------------+ FV DistalFull                                                        +---------+---------------+---------+-----------+----------+--------------+ PFV      Full                                                        +---------+---------------+---------+-----------+----------+--------------+ POP      Full           Yes      Yes                                 +---------+---------------+---------+-----------+----------+--------------+ PTV      Full                                                        +---------+---------------+---------+-----------+----------+--------------+ PERO     Full                                                        +---------+---------------+---------+-----------+----------+--------------+ Gastroc  Full                    Yes                                 +---------+---------------+---------+-----------+----------+--------------+    Summary: RIGHT: - No evidence of common femoral vein obstruction.  LEFT: - There is no evidence of deep vein thrombosis in the lower extremity.  - No cystic structure found in the popliteal fossa.  *See table(s) above for measurements and observations. Electronically signed by Servando Snare MD on 12/27/2020 at 1:44:21 PM.    Final    [place] EKG SITE RITE  Result Date: 01/04/2021 If Site Rite image not attached, placement could not be confirmed due to  current cardiac rhythm.   Lab Data:  CBC: Recent Labs  Lab 01/04/21 0411 01/05/21 0350 01/06/21 1109 01/09/21 1658  WBC 12.9* 9.8 8.9 11.7*  NEUTROABS 11.0* 8.1* 6.5  --  HGB 9.9* 9.0* 8.4* 9.4*  HCT 33.3* 30.3* 28.4* 32.1*  MCV 84.3 83.7 84.0 83.2  PLT 247 253 276 468   Basic Metabolic Panel: Recent Labs  Lab 01/04/21 0411 01/05/21 0350 01/06/21 0358 01/07/21 0441 01/08/21 0500 01/09/21 0330 01/10/21 0450  NA 130* 136 133* 134* 136 135 134*  K 3.9 4.1 3.5 4.0 3.9 3.6 4.0  CL 96* 100 97* 100 102 101 99  CO2 26 25 25 26 26 26 28   GLUCOSE 198* 222* 231* 203* 81 139* 181*  BUN 66* 76* 79* 76* 68* 68* 63*  CREATININE 2.45* 2.85* 2.71* 2.27* 2.11* 2.14* 2.08*  CALCIUM 8.2* 8.4* 8.1* 8.4* 8.5* 8.2* 8.5*  MG 2.0 2.0 1.7  --  2.0  --   --    GFR: Estimated Creatinine Clearance: 26 mL/min (A) (by C-G formula based on SCr of 2.08 mg/dL (H)). Liver Function Tests: Recent Labs  Lab 01/04/21 0411 01/05/21 0350 01/06/21 0358  AST 15 15 13*  ALT 22 19 16   ALKPHOS 91 90 77  BILITOT 0.5 0.5 0.7  PROT 5.4* 5.1* 4.8*  ALBUMIN 2.5* 2.3* 2.1*   No results for input(s): LIPASE, AMYLASE in the last 168 hours. No results for input(s): AMMONIA in the last 168 hours. Coagulation Profile: No results for input(s): INR, PROTIME in the last 168 hours. Cardiac Enzymes: No results for input(s): CKTOTAL, CKMB, CKMBINDEX, TROPONINI in the last 168 hours. BNP (last 3 results) No results for input(s): PROBNP in the last 8760 hours. HbA1C: No results for input(s): HGBA1C in the last 72 hours. CBG: Recent Labs  Lab 01/09/21 1036 01/09/21 1706 01/09/21 2140 01/10/21 0635 01/10/21 1114  GLUCAP 157* 222* 230* 208* 153*   Lipid Profile: No results for input(s): CHOL, HDL, LDLCALC, TRIG, CHOLHDL, LDLDIRECT in the last 72 hours. Thyroid Function Tests: No results for input(s): TSH, T4TOTAL, FREET4, T3FREE, THYROIDAB in the last 72 hours. Anemia Panel: No results for input(s):  VITAMINB12, FOLATE, FERRITIN, TIBC, IRON, RETICCTPCT in the last 72 hours. Urine analysis:    Component Value Date/Time   COLORURINE YELLOW 01/01/2021 2046   APPEARANCEUR CLEAR 01/01/2021 2046   LABSPEC 1.010 01/01/2021 2046   PHURINE 5.0 01/01/2021 2046   GLUCOSEU >=500 (A) 01/01/2021 2046   HGBUR NEGATIVE 01/01/2021 2046   HGBUR negative 07/14/2008 0852   BILIRUBINUR NEGATIVE 01/01/2021 2046   BILIRUBINUR n 08/15/2016 [place] 01/01/2021 2046   PROTEINUR NEGATIVE 01/01/2021 2046   UROBILINOGEN 0.2 08/15/2016 1322   UROBILINOGEN 0.2 04/29/2014 1006   NITRITE NEGATIVE 01/01/2021 2046   LEUKOCYTESUR NEGATIVE 01/01/2021 2046     Dejan Angert M.D. [facility] 01/10/2021, 4:06 PM  Available via Epic secure chat 7am-7pm After 7 pm, please refer to night coverage provider listed on amion.

## 2021-01-10 NOTE — Progress Notes (Addendum)
Patient ID: [name], male   DOB: May 17, 1928, 87 y.o.   MRN: [id number]     Advanced Heart Failure Rounding Note  PCP-Cardiologist: None   Subjective:    Appears to be back in Afib w/ RVR, rates 110s-120s. Asymptomatic.   Co-ox 53% today (stable). SCr remains stable at 2.1. CVP 10   Denies dyspnea. No palpitations. +dry cough.   Awaking SNF placement.   Objective:   Weight Range: 78 kg Body mass index is 22.08 kg/m.   Vital Signs:   Temp:  [97.7 F (36.5 C)-98.3 F (36.8 C)] 98.3 F (36.8 C) (10/25 0400) Pulse Rate:  [65-120] 109 (10/25 0000) Resp:  [16-18] 16 (10/25 0400) BP: (110-121)/(66-81) 112/71 (10/25 0400) SpO2:  [94 %-97 %] 94 % (10/25 0400) Weight:  [78 kg] 78 kg (10/25 0400) Last BM Date: 01/07/21  Weight change: Filed Weights   01/08/21 0400 01/09/21 0500 01/10/21 0400  Weight: 77.1 kg 77.2 kg 78 kg    Intake/Output:   Intake/Output Summary (Last 24 hours) at 01/10/2021 0757 Last data filed at 01/10/2021 0400 Gross per 24 hour  Intake 712 ml  Output 1900 ml  Net -1188 ml      Physical Exam   CVP 10  General:  elderly WM sitting up in bed. No respiratory difficulty HEENT: normal Neck: supple. JVD 9 cm. Carotids 2+ bilat; no bruits. No lymphadenopathy or thyromegaly appreciated. Cor: PMI nondisplaced. Irregularly irregular rhythm, tachy rate. 2/6 SEM  Lungs: clear Abdomen: soft, nontender, nondistended. No hepatosplenomegaly. No bruits or masses. Good bowel sounds. Extremities: no cyanosis, clubbing, rash, trace bilateral LE edema, Rt forearm wrapped Neuro: alert & oriented x 3, cranial nerves grossly intact. moves all 4 extremities w/o difficulty. Affect pleasant.  Telemetry   Afib 110s-120s Personally reviewed   Labs    CBC Recent Labs    01/09/21 1658  WBC 11.7*  HGB 9.4*  HCT 32.1*  MCV 83.2  PLT 381   Basic Metabolic Panel Recent Labs    01/08/21 0500 01/09/21 0330 01/10/21 0450  NA 136 135 134*  K 3.9 3.6 4.0   CL 102 101 99  CO2 26 26 28   GLUCOSE 81 139* 181*  BUN 68* 68* 63*  CREATININE 2.11* 2.14* 2.08*  CALCIUM 8.5* 8.2* 8.5*  MG 2.0  --   --    Liver Function Tests No results for input(s): AST, ALT, ALKPHOS, BILITOT, PROT, ALBUMIN in the last 72 hours. No results for input(s): LIPASE, AMYLASE in the last 72 hours. Cardiac Enzymes No results for input(s): CKTOTAL, CKMB, CKMBINDEX, TROPONINI in the last 72 hours.  BNP: BNP (last 3 results) Recent Labs    01/04/21 0411 01/05/21 0350 01/06/21 1109  BNP 2,990.9* 1,925.1* 993.7*    ProBNP (last 3 results) No results for input(s): PROBNP in the last 8760 hours.   D-Dimer No results for input(s): DDIMER in the last 72 hours. Hemoglobin A1C No results for input(s): HGBA1C in the last 72 hours. Fasting Lipid Panel No results for input(s): CHOL, HDL, LDLCALC, TRIG, CHOLHDL, LDLDIRECT in the last 72 hours. Thyroid Function Tests No results for input(s): TSH, T4TOTAL, T3FREE, THYROIDAB in the last 72 hours.  Invalid input(s): FREET3  Other results:   Imaging    No results found.   Medications:     Scheduled Medications:  allopurinol  100 mg Oral Daily   amiodarone  200 mg Oral BID   apixaban  2.5 mg Oral BID   atorvastatin  40 mg  Oral Daily   Chlorhexidine Gluconate Cloth  6 each Topical Daily   dapagliflozin propanediol  10 mg Oral QAC breakfast   [START ON 01/11/2021] digoxin  0.0625 mg Oral Q M,W,F   escitalopram  10 mg Oral Daily   gabapentin  100 mg Oral BID   guaiFENesin  600 mg Oral BID   insulin aspart  0-9 Units Subcutaneous TID WC   insulin glargine-yfgn  30 Units Subcutaneous QHS   isosorbide mononitrate  15 mg Oral Daily   loratadine  10 mg Oral Daily   pramipexole  0.25 mg Oral Q24H   sodium chloride flush  10-40 mL Intracatheter Q12H   spironolactone  25 mg Oral Daily   tamsulosin  0.4 mg Oral QPC breakfast   torsemide  40 mg Oral Daily   vitamin B-12  1,000 mcg Oral Daily     Infusions:    PRN Medications: acetaminophen **OR** acetaminophen, guaiFENesin-dextromethorphan, lidocaine    Assessment/Plan   1. Acute on chronic systolic CHF: Ischemic cardiomyopathy.  Echo in 5/22 with EF 20-25%, low gradient moderate AS with mean gradient 12 mmHg and AVA 1.1. St Jude CRT-D device.  CHF exacerbation in setting of recurrent atrial fibrillation and loss of effective CRT.  Creatinine now trending back down and stable at 2.1.  Milrinone stopped 10/22, Co-ox 56%.  - Continue torsemide 40 mg daily (on 20 mg daily at home).  - Continue digoxin 0.0625 mg qMWF - Continue Farxiga 10 mg daily.  - Continue spironolactone 25 daily.  - Hold Coreg and hydral/Imdur for now, SBP stable 100s-110s.  2. Atrial fibrillation: Paroxysmal.  Went into AF on 10/9, likely triggered by COVID-19 infection.  Loss of effective CRT with AF.  Back in NSR after DC-CV on 10/21. Back in Afib 10/25 - Restart IV amiodarone  - On Eliquis 2.5 bid.  3. AKI on CKD stage 4: Baseline creatinine around 2.1.  Creatinine 2.85 => 2.71=> 2.27 => 2.11 => 2.14=>2.08 today.  4. COVID-19: Admitted initially 10/8 with COVID-19. Received monoclonal ab and remdesivir initially. Still with cough though CHF likely contributes to this.  - Now off precautions.  5. CAD: s/p CABG.  No chest pain.  HS-TnI minimally elevated with no trend, likely demand ischemia. No chest pain.  - Continue atorvastatin. - Not on ASA with stable CAD and apixaban use.  6. Skin tear on RUE  -  Wound Care consulted. Beside RN to manage 7. Deconditioning - Work w/ PT - Needs SNF. SW assisting w/ placement   Length of Stay: [address], PA-C  01/10/2021, 7:57 AM  Advanced Heart Failure Team Pager [phone] (M-F; 7a - 5p)  Please contact [facility] for night-coverage after hours (5p -7a ) and weekends on [email or web address]   Patient seen with PA, agree with the above note.   He went back into atrial fibrillation overnight,  controlled rate.  Co-ox marginal at 53%.  CVP 7 on my measurement.    He denies dyspnea.   General: NAD Neck: JVP 7-8 cm, no thyromegaly or thyroid nodule.  Lungs: Clear to auscultation bilaterally with normal respiratory effort. CV: Nondisplaced PMI.  Heart irregular S1/S2, no S3/S4, no murmur.  No peripheral edema.   Abdomen: Soft, nontender, no hepatosplenomegaly, no distention.  Skin: Intact without lesions or rashes.  Neurologic: Alert and oriented x 3.  Psych: Normal affect. Extremities: No clubbing or cyanosis.  HEENT: Normal.   Recurrent atrial fibrillation.  Restart amiodarone gtt today.  Will plan DCCV tomorrow  morning if he does not convert to NSR on his own. Discussed risks/benefits with patient, he agrees to procedure.   Volume status looks ok, creatinine lower today 2.08.  Continue torsemide 40 mg daily.   [name] 01/10/2021 9:56 AM

## 2021-01-11 ENCOUNTER — Inpatient Hospital Stay (HOSPITAL_COMMUNITY): Payer: No Typology Code available for payment source | Admitting: Anesthesiology

## 2021-01-11 ENCOUNTER — Encounter (HOSPITAL_COMMUNITY): Payer: Self-pay | Admitting: Family Medicine

## 2021-01-11 ENCOUNTER — Encounter (HOSPITAL_COMMUNITY)
Admission: EM | Disposition: A | Discharge status: Skilled Nursing Facility | Payer: Self-pay | Source: Skilled Nursing Facility | Attending: Internal Medicine

## 2021-01-11 DIAGNOSIS — I5023 Acute on chronic systolic (congestive) heart failure: Secondary | ICD-10-CM | POA: Diagnosis not present

## 2021-01-11 DIAGNOSIS — I480 Paroxysmal atrial fibrillation: Secondary | ICD-10-CM | POA: Diagnosis not present

## 2021-01-11 HISTORY — PX: CARDIOVERSION: SHX1299

## 2021-01-11 LAB — PROTIME-INR
INR: 1.4 — ABNORMAL HIGH (ref 0.8–1.2)
Prothrombin Time: 17.2 seconds — ABNORMAL HIGH (ref 11.4–15.2)

## 2021-01-11 LAB — GLUCOSE, CAPILLARY
Glucose-Capillary: 61 mg/dL — ABNORMAL LOW (ref 70–99)
Glucose-Capillary: 92 mg/dL (ref 70–99)
Glucose-Capillary: 51 mg/dL — ABNORMAL LOW (ref 70–99)
Glucose-Capillary: 80 mg/dL (ref 70–99)
Glucose-Capillary: 189 mg/dL — ABNORMAL HIGH (ref 70–99)
Glucose-Capillary: 295 mg/dL — ABNORMAL HIGH (ref 70–99)

## 2021-01-11 LAB — BASIC METABOLIC PANEL
Anion gap: 7 (ref 5–15)
BUN: 63 mg/dL — ABNORMAL HIGH (ref 8–23)
CO2: 28 mmol/L (ref 22–32)
Calcium: 8.5 mg/dL — ABNORMAL LOW (ref 8.9–10.3)
Chloride: 100 mmol/L (ref 98–111)
Creatinine, Ser: 2.14 mg/dL — ABNORMAL HIGH (ref 0.61–1.24)
GFR, Estimated: 29 mL/min — ABNORMAL LOW (ref >60)
Glucose, Bld: 58 mg/dL — ABNORMAL LOW (ref 70–99)
Potassium: 3.8 mmol/L (ref 3.5–5.1)
Sodium: 135 mmol/L (ref 135–145)

## 2021-01-11 LAB — COOXEMETRY PANEL
Carboxyhemoglobin: 0.1 % — ABNORMAL LOW (ref 0.5–1.5)
Methemoglobin: 0.5 % (ref 0.0–1.5)
O2 Saturation: 54.1 %
Total hemoglobin: 9.2 g/dL — ABNORMAL LOW (ref 12.0–16.0)

## 2021-01-11 LAB — MAGNESIUM: Magnesium: 2.2 mg/dL (ref 1.7–2.4)

## 2021-01-11 SURGERY — CARDIOVERSION
Anesthesia: General

## 2021-01-11 MED ORDER — INSULIN GLARGINE-YFGN 100 UNIT/ML ~~LOC~~ SOLN
25.0000 [IU] | Freq: Every day | SUBCUTANEOUS | Status: DC
  Administered 2021-01-11 – 2021-01-12 (×2): 25 [IU] via SUBCUTANEOUS
  Filled 2021-01-11 (×3): qty 0.25

## 2021-01-11 MED ORDER — LIDOCAINE 2% (20 MG/ML) 5 ML SYRINGE
INTRAMUSCULAR | Status: DC | PRN
  Administered 2021-01-11: 60 mg via INTRAVENOUS

## 2021-01-11 MED ORDER — POTASSIUM CHLORIDE CRYS ER 20 MEQ PO TBCR
20.0000 meq | EXTENDED_RELEASE_TABLET | Freq: Once | ORAL | Status: AC
  Administered 2021-01-11: 20 meq via ORAL
  Filled 2021-01-11: qty 1

## 2021-01-11 MED ORDER — SODIUM CHLORIDE 0.9 % IV SOLN: INTRAVENOUS | Status: DC | PRN

## 2021-01-11 MED ORDER — PROPOFOL 10 MG/ML IV BOLUS
INTRAVENOUS | Status: DC | PRN
  Administered 2021-01-11: 30 mg via INTRAVENOUS

## 2021-01-11 MED ORDER — DEXTROSE 50 % IV SOLN
12.5000 g | INTRAVENOUS | Status: AC
  Administered 2021-01-11: 12.5 g via INTRAVENOUS
  Filled 2021-01-11: qty 50

## 2021-01-11 NOTE — Procedures (Signed)
Electrical Cardioversion Procedure Note [name] [id number] 01-08-1929  Procedure: Electrical Cardioversion Indications:  Atrial Fibrillation  Procedure Details Consent: Risks of procedure as well as the alternatives and risks of each were explained to the (patient/caregiver).  Consent for procedure obtained. Time Out: Verified patient identification, verified procedure, site/side was marked, verified correct patient position, special equipment/implants available, medications/allergies/relevent history reviewed, required imaging and test results available.  Performed  Patient placed on cardiac monitor, pulse oximetry, supplemental oxygen as necessary.  Sedation given:  Propofol per anesthesiology. Pacer pads placed anterior and posterior chest.  Cardioverted 1 time(s).  Cardioverted at [facility].  Evaluation Findings: Post procedure EKG shows: NSR Complications: None Patient did tolerate procedure well. St Jude device checked after procedure, working properly.    [name] 01/11/2021, 11:32 AM

## 2021-01-11 NOTE — Plan of Care (Signed)
  Problem: Education: Goal: Knowledge of General Education information will improve Description Including pain rating scale, medication(s)/side effects and non-pharmacologic comfort measures Outcome: Progressing   

## 2021-01-11 NOTE — Transfer of Care (Signed)
Immediate Anesthesia Transfer of Care Note  Patient: [name]  Procedure(s) Performed: CARDIOVERSION  Patient Location: PACU and Endoscopy Unit  Anesthesia Type:General  Level of Consciousness: drowsy  Airway & Oxygen Therapy: Patient Spontanous Breathing  Post-op Assessment: Report given to RN and Post -op Vital signs reviewed and stable  Post vital signs: Reviewed and stable  Last Vitals:  Vitals Value Taken Time  BP    Temp    Pulse    Resp    SpO2      Last Pain:  Vitals:   01/11/21 1042  TempSrc: Temporal  PainSc: 0-No pain      Patients Stated Pain Goal: 0 (00/34/91 7915)  Complications: No notable events documented.

## 2021-01-11 NOTE — Plan of Care (Signed)

## 2021-01-11 NOTE — Interval H&P Note (Signed)
History and Physical Interval Note:  01/11/2021 11:21 AM  [name]  has presented today for surgery, with the diagnosis of afib.  The various methods of treatment have been discussed with the patient and family. After consideration of risks, benefits and other options for treatment, the patient has consented to  Procedure(s): CARDIOVERSION (N/A) as a surgical intervention.  The patient's history has been reviewed, patient examined, no change in status, stable for surgery.  I have reviewed the patient's chart and labs.  Questions were answered to the patient's satisfaction.     [name]

## 2021-01-11 NOTE — Progress Notes (Signed)
   01/11/21 1647  Mobility  Activity Refused mobility (Declined despite max encouragement)   Patient received in recliner chair. Declined ambulation despite max encouragement.

## 2021-01-11 NOTE — Progress Notes (Signed)
Patient ID: [name], male   DOB: [birth year], 87 y.o.   MRN: [id number]     Advanced Heart Failure Rounding Note  PCP-Cardiologist: None   Subjective:    DCCV to NSR today without complications.    Creatinine stable at 2.14.  Co-ox stable at 54%.    Weak in general but no dyspnea at rest or walking to bathroom.   Objective:   Weight Range: 76.5 kg Body mass index is 21.65 kg/m.   Vital Signs:   Temp:  [96.7 F (35.9 C)-98.6 F (37 C)] 96.7 F (35.9 C) (10/26 1042) Pulse Rate:  [86-102] 86 (10/26 0743) Resp:  [16-20] 18 (10/26 1042) BP: (115-133)/(49-76) 133/49 (10/26 1042) SpO2:  [95 %-98 %] 95 % (10/26 1042) Weight:  [76.5 kg] 76.5 kg (10/26 0010) Last BM Date: 01/07/21  Weight change: Filed Weights   01/09/21 0500 01/10/21 0400 01/11/21 0010  Weight: 77.2 kg 78 kg 76.5 kg    Intake/Output:   Intake/Output Summary (Last 24 hours) at 01/11/2021 1135 Last data filed at 01/11/2021 1132 Gross per 24 hour  Intake 1225.94 ml  Output 1425 ml  Net -199.06 ml      Physical Exam    General: NAD Neck: JVP 8 cm, no thyromegaly or thyroid nodule.  Lungs: Clear to auscultation bilaterally with normal respiratory effort. CV: Nondisplaced PMI.  Heart irregular => regular S1/S2, no S3/S4, no murmur.  Trace ankle edema.  Abdomen: Soft, nontender, no hepatosplenomegaly, no distention.  Skin: Intact without lesions or rashes.  Neurologic: Alert and oriented x 3.  Psych: Normal affect. Extremities: No clubbing or cyanosis.  HEENT: Normal.    Telemetry   Afib 100s => A/BiV paced in 80s. Personally reviewed   Labs    CBC Recent Labs    01/09/21 1658  WBC 11.7*  HGB 9.4*  HCT 32.1*  MCV 83.2  PLT 825   Basic Metabolic Panel Recent Labs    01/10/21 0450 01/11/21 0602  NA 134* 135  K 4.0 3.8  CL 99 100  CO2 28 28  GLUCOSE 181* 58*  BUN 63* 63*  CREATININE 2.08* 2.14*  CALCIUM 8.5* 8.5*  MG  --  2.2   Liver Function Tests No results for  input(s): AST, ALT, ALKPHOS, BILITOT, PROT, ALBUMIN in the last 72 hours. No results for input(s): LIPASE, AMYLASE in the last 72 hours. Cardiac Enzymes No results for input(s): CKTOTAL, CKMB, CKMBINDEX, TROPONINI in the last 72 hours.  BNP: BNP (last 3 results) Recent Labs    01/04/21 0411 01/05/21 0350 01/06/21 1109  BNP 2,990.9* 1,925.1* 993.7*    ProBNP (last 3 results) No results for input(s): PROBNP in the last 8760 hours.   D-Dimer No results for input(s): DDIMER in the last 72 hours. Hemoglobin A1C No results for input(s): HGBA1C in the last 72 hours. Fasting Lipid Panel No results for input(s): CHOL, HDL, LDLCALC, TRIG, CHOLHDL, LDLDIRECT in the last 72 hours. Thyroid Function Tests No results for input(s): TSH, T4TOTAL, T3FREE, THYROIDAB in the last 72 hours.  Invalid input(s): FREET3  Other results:   Imaging    No results found.   Medications:     Scheduled Medications:  [MAR Hold] allopurinol  100 mg Oral Daily   [MAR Hold] apixaban  2.5 mg Oral BID   [MAR Hold] atorvastatin  40 mg Oral Daily   [MAR Hold] Chlorhexidine Gluconate Cloth  6 each Topical Daily   [MAR Hold] dapagliflozin propanediol  10 mg Oral QAC breakfast   [  MAR Hold] digoxin  0.0625 mg Oral Q M,W,F   [MAR Hold] escitalopram  10 mg Oral Daily   [MAR Hold] gabapentin  100 mg Oral BID   [MAR Hold] guaiFENesin  600 mg Oral BID   [MAR Hold] insulin aspart  0-15 Units Subcutaneous TID WC   [MAR Hold] insulin aspart  0-5 Units Subcutaneous QHS   [MAR Hold] insulin glargine-yfgn  30 Units Subcutaneous QHS   [MAR Hold] isosorbide mononitrate  15 mg Oral Daily   [MAR Hold] loratadine  10 mg Oral Daily   [MAR Hold] pramipexole  0.25 mg Oral Q24H   [MAR Hold] sodium chloride flush  10-40 mL Intracatheter Q12H   [MAR Hold] spironolactone  25 mg Oral Daily   [MAR Hold] tamsulosin  0.4 mg Oral QPC breakfast   [MAR Hold] torsemide  40 mg Oral Daily   [MAR Hold] vitamin B-12  1,000 mcg Oral  Daily    Infusions:  amiodarone 30 mg/hr (01/11/21 0943)     PRN Medications: [MAR Hold] acetaminophen **OR** [MAR Hold] acetaminophen, [MAR Hold] guaiFENesin-dextromethorphan, [MAR Hold] lidocaine    Assessment/Plan   1. Acute on chronic systolic CHF: Ischemic cardiomyopathy.  Echo in 5/22 with EF 20-25%, low gradient moderate AS with mean gradient 12 mmHg and AVA 1.1. St Jude CRT-D device.  CHF exacerbation in setting of recurrent atrial fibrillation and loss of effective CRT.  Creatinine stable around 2.1.  Milrinone stopped 10/22, Co-ox 54% which has been stable.  He was cardioverted back to NSR today.  Probably no more than mild volume overload on exam.  - Continue torsemide 40 mg daily (on 20 mg daily at home).  - Continue digoxin 0.0625 mg qMWF - Continue Farxiga 10 mg daily.  - Continue spironolactone 25 daily.  - Hold Coreg and hydral/Imdur for now.  2. Atrial fibrillation: Paroxysmal.  Went into AF on 10/9, likely triggered by COVID-19 infection.  Loss of effective CRT with AF.  Back in NSR after DC-CV on 10/21. Back in Afib 10/25. DCCV again today (10/26) and now in NSR.  - Continue IV amiodarone overnight, back to po tomorrow.  - On Eliquis 2.5 bid.  3. AKI on CKD stage 4: Baseline creatinine around 2.1.  Creatinine 2.85 => 2.71=> 2.27 => 2.11 => 2.14=>2.08 => 2.14 today.  4. COVID-19: Admitted initially 10/8 with COVID-19. Received monoclonal ab and remdesivir initially. Still with cough though CHF likely contributes to this.  - Now off precautions.  5. CAD: s/p CABG.  No chest pain.  HS-TnI minimally elevated with no trend, likely demand ischemia. No chest pain.  - Continue atorvastatin. - Not on ASA with stable CAD and apixaban use.  6. Skin tear on RUE  -  Wound Care consulted. Bedside RN to manage 7. Deconditioning - Work w/ PT - Needs SNF. SW assisting w/ placement   Hopefully ready tomorrow to go to SNF.   Length of Stay: [place], [place]  01/11/2021,  11:35 AM  Advanced Heart Failure Team Pager [phone] (M-F; 7a - 5p)  Please contact [facility] for night-coverage after hours (5p -7a ) and weekends on [email or web address]

## 2021-01-11 NOTE — Progress Notes (Signed)
Inpatient Diabetes Program Recommendations  AACE/ADA: New Consensus Statement on Inpatient Glycemic Control (2015)  Target Ranges:  Prepandial:   less than 140 mg/dL      Peak postprandial:   less than 180 mg/dL (1-2 hours)      Critically ill patients:  140 - 180 mg/dL   Lab Results  Component Value Date   GLUCAP 51 (L) 01/11/2021   HGBA1C 8.5 (H) 12/26/2020    Review of Glycemic Control Results for JOREN, REHM (MRN [id number]) as of 01/11/2021 13:03  Ref. Range 01/10/2021 06:35 01/10/2021 11:14 01/10/2021 16:17 01/10/2021 21:13 01/11/2021 06:12 01/11/2021 07:08 01/11/2021 12:16  Glucose-Capillary Latest Ref Range: 70 - 99 mg/dL 208 (H) 153 (H) 299 (H) 348 (H) 61 (L) 92 51 (L)   Diabetes history: DM 2 Outpatient Diabetes medications:  Farxiga 10 mg daily, Lantus 30 units q HS Current orders for Inpatient glycemic control:  Novolog moderate tid with meals and HS Semglee 30 units Inpatient Diabetes Program Recommendations:   Note low blood sugars while NPO.  Consider reducing Semglee to 25 units q HS.    Thanks,  Adah Perl, RN, BC-ADM Inpatient Diabetes Coordinator Pager [phone]

## 2021-01-11 NOTE — Progress Notes (Signed)
PROGRESS NOTE    [name]  NGE:[id number] DOB: 1929/02/08 DOA: 01/01/2021 PCP: Burnis Medin, MD   Brief Narrative:  This 87 y.o. 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  Recently discharged on 12/27/2020 after CHF exacerbation and has continued to become progressively more short of breath since this time, he developed shortness of breath along with some dry cough, work-up suggestive of bronchitis along with underlying CHF.  Patient was admitted for further work-up.   10/25: Patient was significantly improving from cardiac standpoint and heart failure however went back into atrial fibrillation with RVR , IV amiodarone resumed. 10/26: Patient underwent successful DCCV to normal sinus rhythm.  Plan to DC to SNF when cleared by cardiology.  Assessment & Plan:   Principal Problem:   Acute on chronic systolic CHF (congestive heart failure) (HCC) Active Problems:   Hyperlipidemia   Obstructive sleep apnea   RESTLESS LEG SYNDROME   Essential hypertension   CAD (coronary artery disease)   Iron deficiency anemia   AF (paroxysmal atrial fibrillation) (HCC)   Diabetes mellitus type 2, controlled (HCC)   Leukocytosis   COVID-19 virus infection   Acute renal failure superimposed on stage 3b chronic kidney disease (HCC)   Elevated troponin   Acute hypoxic respiratory failure could be sec. to acute on chronic systolic CHF: Ischemic cardiomyopathy: Patient initially placed on IV Lasix and milrinone,  now has been transitioned to oral torsemide 40 mg daily. Cardiology/CHF team following. Continue digoxin, Farxiga, Aldactone 25 mg daily. Blood pressure has improved, Continue to hold Coreg, hydralazine. PT OT recommending SNF for rehab before returning home .   Paroxysmal A. fib  with RVR: Patient underwent DCCV on 10/21, remained in normal sinus rhythm until back in A. fib on 10/25 Started on IV amiodarone.  Continue Eliquis 2.5 mg twice daily. Patient underwent successful DCCV to normal sinus rhythm on 10/26. Continue amiodarone IV tonight and change to p.o. tomorrow.   Acute bronchitis: She completed course for doxycycline. Continue Mucinex flutter valve and supportive care Currently stable, no wheezing   Recent COVID-19 infection: COVID+ on 12/24/2020, has received monoclonal antibody and remdesivir. No acute issues, has been off isolation.   AKI on CKD stage IIIb : Baseline creatinine 2.15 Creatinine around baseline 2.0   Essential hypertension Blood pressure is improved . Hold Coreg and hydralazine   Obstructive sleep apnea Continue CPAP   Iron deficiency anemia Hemoglobin is stable   Restless leg syndrome Continue Mirapex   Type 2 diabetes. CBGs elevated, in 200s Change sliding scale to moderate, continue Semglee 30 units at bedtime, continue Farxiga    Pressure injury, left arm anterior upper skin tear -Present on admission, wound care per nursing Right upper extremity skin tear, wound care was consulted     DVT prophylaxis: Eliquis Code Status: DNR Family Communication:  No family at bed side. Disposition Plan:    Status is: Inpatient  Remains inpatient appropriate because: Current A. fib with RVR may require DCCV.  Anticipated discharge home in 1 to 2 days.     Consultants:  Cardiology  Procedures: Echocardiogram, DCCV Antimicrobials:   Anti-infectives (From admission, onward)    Start     Dose/Rate Route Frequency Ordered Stop   01/03/21 1000  azithromycin (ZITHROMAX) tablet 250 mg  Status:  Discontinued        250 mg Oral Daily 01/02/21 1049 01/02/21 1119   01/02/21 1130  doxycycline (VIBRA-TABS) tablet 100 mg        100 mg Oral Every 12 hours 01/02/21 1119 01/06/21 2034   01/02/21 1100  azithromycin (ZITHROMAX)  tablet 500 mg  Status:  Discontinued        500 mg Oral  Once 01/02/21 1048 01/02/21 1119        Subjective: Patient was seen and examined at bedside.  Overnight events noted.   Patient underwent successful DCCV to NSR.  Patient reports feeling better.  Patient denies any chest pain.  Objective: Vitals:   01/11/21 1042 01/11/21 1138 01/11/21 1148 01/11/21 1157  BP: (!) 133/49 (!) 105/47 (!) 118/57 125/60  Pulse:  72 63 72  Resp: 18 14 13 15   Temp: (!) 96.7 F (35.9 C) (!) 96.9 F (36.1 C)    TempSrc: Temporal Temporal    SpO2: 95% 96% 98% 97%  Weight:      Height:        Intake/Output Summary (Last 24 hours) at 01/11/2021 1416 Last data filed at 01/11/2021 1400 Gross per 24 hour  Intake 1006.4 ml  Output 1150 ml  Net -143.6 ml   Filed Weights   01/09/21 0500 01/10/21 0400 01/11/21 0010  Weight: 77.2 kg 78 kg 76.5 kg    Examination:  General exam: Appears comfortable, not in any acute distress.  Deconditioned Respiratory system: Clear to auscultation. Respiratory effort normal. Cardiovascular system: S1-S2 heard, Irregular rhythm, no murmur,  Gastrointestinal system: Abdomen soft, nontender, nondistended, BS +. Central nervous system: Alert and oriented. No focal neurological deficits. Extremities: No edema, no cyanosis, no clubbing. Skin: No rashes, lesions or ulcers Psychiatry: Judgement and insight appear normal. Mood & affect appropriate.     Data Reviewed: I have personally reviewed following labs and imaging studies  CBC: Recent Labs  Lab 01/05/21 0350 01/06/21 1109 01/09/21 1658  WBC 9.8 8.9 11.7*  NEUTROABS 8.1* 6.5  --   HGB 9.0* 8.4* 9.4*  HCT 30.3* 28.4* 32.1*  MCV 83.7 84.0 83.2  PLT 253 276 631   Basic Metabolic Panel: Recent Labs  Lab 01/05/21 0350 01/06/21 0358 01/07/21 0441 01/08/21 0500 01/09/21 0330 01/10/21 0450 01/11/21 0602  NA 136 133* 134* 136 135 134* 135  K 4.1 3.5 4.0 3.9 3.6 4.0 3.8  CL 100 97* 100 102 101 99 100   CO2 25 25 26 26 26 28 28   GLUCOSE 222* 231* 203* 81 139* 181* 58*  BUN 76* 79* 76* 68* 68* 63* 63*  CREATININE 2.85* 2.71* 2.27* 2.11* 2.14* 2.08* 2.14*  CALCIUM 8.4* 8.1* 8.4* 8.5* 8.2* 8.5* 8.5*  MG 2.0 1.7  --  2.0  --   --  2.2   GFR: Estimated Creatinine Clearance: 24.8 mL/min (A) (by C-G formula based on SCr of 2.14 mg/dL (H)). Liver Function Tests: Recent Labs  Lab 01/05/21 0350 01/06/21 0358  AST 15 13*  ALT 19 16  ALKPHOS 90 77  BILITOT 0.5 0.7  PROT 5.1* 4.8*  ALBUMIN 2.3* 2.1*   No results for input(s): LIPASE, AMYLASE in the last 168 hours. No results for input(s): AMMONIA in the last 168 hours. Coagulation Profile: Recent Labs  Lab 01/11/21 0602  INR 1.4*   Cardiac Enzymes: No results for input(s): CKTOTAL, CKMB, CKMBINDEX, TROPONINI in the last 168 hours. BNP (last 3 results) No results for input(s): PROBNP in the last 8760 hours.  HbA1C: No results for input(s): HGBA1C in the last 72 hours. CBG: Recent Labs  Lab 01/10/21 2113 01/11/21 0612 01/11/21 0708 01/11/21 1216 01/11/21 1334  GLUCAP 348* 61* 92 51* 80   Lipid Profile: No results for input(s): CHOL, HDL, LDLCALC, TRIG, CHOLHDL, LDLDIRECT in the last 72 hours. Thyroid Function Tests: No results for input(s): TSH, T4TOTAL, FREET4, T3FREE, THYROIDAB in the last 72 hours. Anemia Panel: No results for input(s): VITAMINB12, FOLATE, FERRITIN, TIBC, IRON, RETICCTPCT in the last 72 hours. Sepsis Labs: No results for input(s): PROCALCITON, LATICACIDVEN in the last 168 hours.  Recent Results (from the past 240 hour(s))  Resp Panel by RT-PCR (Flu A&B, Covid) Nasopharyngeal Swab     Status: Abnormal   Collection Time: 01/01/21  2:34 PM   Specimen: Nasopharyngeal Swab; Nasopharyngeal(NP) swabs in vial transport medium  Result Value Ref Range Status   SARS Coronavirus 2 by RT PCR POSITIVE (A) NEGATIVE Final    Comment: RESULT CALLED TO, READ BACK BY AND VERIFIED WITH: RN J.COOK ON [id number] AT 1650 BY  E.PARRISH (NOTE) SARS-CoV-2 target nucleic acids are DETECTED.  The SARS-CoV-2 RNA is generally detectable in upper respiratory specimens during the acute phase of infection. Positive results are indicative of the presence of the identified virus, but do not rule out bacterial infection or co-infection with other pathogens not detected by the test. Clinical correlation with patient history and other diagnostic information is necessary to determine patient infection status. The expected result is Negative.  Fact Sheet for Patients: EntrepreneurPulse.com.au  Fact Sheet for Healthcare Providers: IncredibleEmployment.be  This test is not yet approved or cleared by the [place] FDA and  has been authorized for detection and/or diagnosis of SARS-CoV-2 by FDA under an Emergency Use Authorization (EUA).  This EUA will remain in effect (meaning this te st can be used) for the duration of  the COVID-19 declaration under Section 564(b)(1) of the Act, 21 U.S.C. section 360bbb-3(b)(1), unless the authorization is terminated or revoked sooner.     Influenza A by PCR NEGATIVE NEGATIVE Final   Influenza B by PCR NEGATIVE NEGATIVE Final    Comment: (NOTE) The Xpert Xpress SARS-CoV-2/FLU/RSV plus assay is intended as an aid in the diagnosis of influenza from Nasopharyngeal swab specimens and should not be used as a sole basis for treatment. Nasal washings and aspirates are unacceptable for Xpert Xpress SARS-CoV-2/FLU/RSV testing.  Fact Sheet for Patients: EntrepreneurPulse.com.au  Fact Sheet for Healthcare Providers: IncredibleEmployment.be  This test is not yet approved or cleared by the [place] FDA and has been authorized for detection and/or diagnosis of SARS-CoV-2 by FDA under an Emergency Use Authorization (EUA). This EUA will remain in effect (meaning this test can be used) for the duration of  the COVID-19 declaration under Section 564(b)(1) of the Act, 21 U.S.C. section 360bbb-3(b)(1), unless the authorization is terminated or revoked.  Performed at [facility], [place] [address], [place], [place] [address]     Radiology Studies: No results found.  Scheduled Meds:  allopurinol  100 mg Oral Daily   apixaban  2.5 mg Oral BID   atorvastatin  40 mg Oral Daily   Chlorhexidine Gluconate Cloth  6 each Topical Daily   dapagliflozin propanediol  10 mg Oral QAC breakfast   digoxin  0.0625 mg Oral Q M,W,F   escitalopram  10 mg Oral Daily   gabapentin  100 mg Oral BID   guaiFENesin  600 mg Oral BID   insulin aspart  0-15 Units Subcutaneous  TID WC   insulin aspart  0-5 Units Subcutaneous QHS   insulin glargine-yfgn  25 Units Subcutaneous QHS   isosorbide mononitrate  15 mg Oral Daily   loratadine  10 mg Oral Daily   pramipexole  0.25 mg Oral Q24H   sodium chloride flush  10-40 mL Intracatheter Q12H   spironolactone  25 mg Oral Daily   tamsulosin  0.4 mg Oral QPC breakfast   torsemide  40 mg Oral Daily   vitamin B-12  1,000 mcg Oral Daily   Continuous Infusions:  amiodarone 30 mg/hr (01/11/21 1320)     LOS: 10 days    Time spent: 35 mins    Kelis Plasse, MD [facility]   If 7PM-7AM, please contact night-coverage

## 2021-01-11 NOTE — Anesthesia Preprocedure Evaluation (Signed)
Anesthesia Evaluation  Patient identified by MRN, date of birth, ID band Patient awake    Reviewed: Allergy & Precautions, NPO status , Patient's Chart, lab work & pertinent test results  Airway Mallampati: III  TM Distance: >3 FB Neck ROM: Full    Dental  (+) Dental Advisory Given, Poor Dentition, Missing   Pulmonary sleep apnea and Continuous Positive Airway Pressure Ventilation , former smoker,    Pulmonary exam normal breath sounds clear to auscultation       Cardiovascular hypertension, + CAD, + Past MI, + CABG, + Peripheral Vascular Disease and +CHF  Normal cardiovascular exam+ dysrhythmias Atrial Fibrillation + pacemaker + Cardiac Defibrillator + Valvular Problems/Murmurs AS  Rhythm:Irregular Rate:Normal  TTE 2022 1. Left ventricular ejection fraction, by estimation, is 20 to 25%. The  left ventricle has severely decreased function. The left ventricle  demonstrates regional wall motion abnormalities with apical and  peri-apical akinesis, no LV thrombus. The left  ventricular internal cavity size was moderately dilated. There is moderate  left ventricular hypertrophy. Left ventricular diastolic parameters are  consistent with Grade I diastolic dysfunction (impaired relaxation).  2. Right ventricular systolic function is mildly reduced. The right  ventricular size is normal. Tricuspid regurgitation signal is inadequate  for assessing PA pressure.  3. Left atrial size was mild to moderately dilated.  4. Right atrial size was mildly dilated.  5. The mitral valve is normal in structure. Mild mitral valve  regurgitation. No evidence of mitral stenosis.  6. The aortic valve is tricuspid. Aortic valve regurgitation is mild.  Suspect low gradient moderate aortic valve stenosis. Aortic valve area, by  VTI measures 1.13 cm. Aortic valve mean gradient measures 12.0 mmHg.  7. The inferior vena cava is normal in size with  greater than 50%  respiratory variability, suggesting right atrial pressure of 3 mmHg.   Neuro/Psych PSYCHIATRIC DISORDERS Depression negative neurological ROS     GI/Hepatic negative GI ROS, Neg liver ROS,   Endo/Other  diabetes, Type 2, Insulin Dependent  Renal/GU Renal InsufficiencyRenal disease  negative genitourinary   Musculoskeletal negative musculoskeletal ROS (+)   Abdominal   Peds  Hematology negative hematology ROS (+)   Anesthesia Other Findings   Reproductive/Obstetrics                             Anesthesia Physical Anesthesia Plan  ASA: 4  Anesthesia Plan: General   Post-op Pain Management:    Induction: Intravenous  PONV Risk Score and Plan: 2 and Propofol infusion and Treatment may vary due to age or medical condition  Airway Management Planned: Natural Airway  Additional Equipment:   Intra-op Plan:   Post-operative Plan:   Informed Consent: I have reviewed the patients History and Physical, chart, labs and discussed the procedure including the risks, benefits and alternatives for the proposed anesthesia with the patient or authorized representative who has indicated his/her understanding and acceptance.     Dental advisory given  Plan Discussed with: CRNA  Anesthesia Plan Comments:         Anesthesia Quick Evaluation

## 2021-01-11 NOTE — Anesthesia Postprocedure Evaluation (Signed)
Anesthesia Post Note  Patient: [name]  Procedure(s) Performed: CARDIOVERSION     Patient location during evaluation: Endoscopy Anesthesia Type: General Level of consciousness: awake and alert Pain management: pain level controlled Vital Signs Assessment: post-procedure vital signs reviewed and stable Respiratory status: spontaneous breathing, nonlabored ventilation, respiratory function stable and patient connected to nasal cannula oxygen Cardiovascular status: blood pressure returned to baseline and stable Postop Assessment: no apparent nausea or vomiting Anesthetic complications: no   No notable events documented.  Last Vitals:  Vitals:   01/11/21 1148 01/11/21 1157  BP: (!) 118/57 125/60  Pulse: 63 72  Resp: 13 15  Temp:    SpO2: 98% 97%    Last Pain:  Vitals:   01/11/21 1300  TempSrc:   PainSc: 0-No pain                 Jasnoor Trussell L Amour Cutrone

## 2021-01-12 ENCOUNTER — Encounter (HOSPITAL_COMMUNITY): Payer: Self-pay | Admitting: Cardiology

## 2021-01-12 DIAGNOSIS — I5023 Acute on chronic systolic (congestive) heart failure: Secondary | ICD-10-CM | POA: Diagnosis not present

## 2021-01-12 LAB — GLUCOSE, CAPILLARY
Glucose-Capillary: 75 mg/dL (ref 70–99)
Glucose-Capillary: 139 mg/dL — ABNORMAL HIGH (ref 70–99)
Glucose-Capillary: 114 mg/dL — ABNORMAL HIGH (ref 70–99)
Glucose-Capillary: 378 mg/dL — ABNORMAL HIGH (ref 70–99)

## 2021-01-12 LAB — MAGNESIUM: Magnesium: 1.9 mg/dL (ref 1.7–2.4)

## 2021-01-12 LAB — COOXEMETRY PANEL
Carboxyhemoglobin: 1.1 % (ref 0.5–1.5)
Methemoglobin: 1.2 % (ref 0.0–1.5)
O2 Saturation: 61.6 %
Total hemoglobin: 8.8 g/dL — ABNORMAL LOW (ref 12.0–16.0)

## 2021-01-12 LAB — CBC
HCT: 28.1 % — ABNORMAL LOW (ref 39.0–52.0)
Hemoglobin: 8.4 g/dL — ABNORMAL LOW (ref 13.0–17.0)
MCH: 24.8 pg — ABNORMAL LOW (ref 26.0–34.0)
MCHC: 29.9 g/dL — ABNORMAL LOW (ref 30.0–36.0)
MCV: 82.9 fL (ref 80.0–100.0)
Platelets: 247 10*3/uL (ref 150–400)
RBC: 3.39 MIL/uL — ABNORMAL LOW (ref 4.22–5.81)
RDW: 16.8 % — ABNORMAL HIGH (ref 11.5–15.5)
WBC: 11.6 10*3/uL — ABNORMAL HIGH (ref 4.0–10.5)
nRBC: 0 % (ref 0.0–0.2)

## 2021-01-12 LAB — BASIC METABOLIC PANEL
Anion gap: 8 (ref 5–15)
BUN: 67 mg/dL — ABNORMAL HIGH (ref 8–23)
CO2: 27 mmol/L (ref 22–32)
Calcium: 8.4 mg/dL — ABNORMAL LOW (ref 8.9–10.3)
Chloride: 99 mmol/L (ref 98–111)
Creatinine, Ser: 2.51 mg/dL — ABNORMAL HIGH (ref 0.61–1.24)
GFR, Estimated: 24 mL/min — ABNORMAL LOW (ref >60)
Glucose, Bld: 91 mg/dL (ref 70–99)
Potassium: 4 mmol/L (ref 3.5–5.1)
Sodium: 134 mmol/L — ABNORMAL LOW (ref 135–145)

## 2021-01-12 LAB — PHOSPHORUS: Phosphorus: 3.4 mg/dL (ref 2.5–4.6)

## 2021-01-12 LAB — SARS CORONAVIRUS 2 (TAT 6-24 HRS): SARS Coronavirus 2: POSITIVE — AB

## 2021-01-12 MED ORDER — AMIODARONE HCL 200 MG PO TABS
200.0000 mg | ORAL_TABLET | Freq: Two times a day (BID) | ORAL | Status: DC
  Administered 2021-01-12 – 2021-01-13 (×3): 200 mg via ORAL
  Filled 2021-01-12 (×3): qty 1

## 2021-01-12 MED ORDER — TORSEMIDE 20 MG PO TABS
40.0000 mg | ORAL_TABLET | Freq: Every day | ORAL | Status: DC
  Administered 2021-01-13: 40 mg via ORAL
  Filled 2021-01-12: qty 2

## 2021-01-12 NOTE — TOC Progression Note (Addendum)
Transition of Care [facility]) - Progression Note    Patient Details  Name: [name] MRN: [id number] Date of Birth: [birth year]  Transition of Care [facility]) CM/SW [place], [place] Phone Number: 01/12/2021, 1:07 PM  Clinical Narrative:    [facility] CSW received a message from the patient's nurse if he is discharging today. [facility] initially said they had a bed available however after speaking to [facility] she reported she won't have a bed until Monday. CSW reached out to [facility] to confirm bed availability as this is the family's second choice and [facility] to call the CSW back to confirm availability today. [facility] reported they do have availability today and that he doesn't need a COVID test at this time. Mr. Altschuler will DC tomorrow to [facility].  CSW will continue to follow throughout discharge.   Expected Discharge Plan: [place] Barriers to Discharge: Continued Medical Work up  Expected Discharge Plan and Services Expected Discharge Plan: [place] In-house Referral: Clinical Social Work Discharge Planning Services: CM Consult Post Acute Care Choice: [place] arrangements for the past 2 months: Single Family Home                                       Social Determinants of Health (SDOH) Interventions    Readmission Risk Interventions No flowsheet data found.  Santez Woodcox, MSW, [place] Heart Failure Social Worker

## 2021-01-12 NOTE — Plan of Care (Signed)
  Problem: Education: Goal: Knowledge of General Education information will improve Description Including pain rating scale, medication(s)/side effects and non-pharmacologic comfort measures Outcome: Progressing   

## 2021-01-12 NOTE — Progress Notes (Signed)
Physical Therapy Treatment Patient Details Name: [name] MRN: [id number] DOB: Aug 10, 1928 Today's Date: 01/12/2021   History of Present Illness 87 y.o. [name] was admitted on 10/16 for a fall at home with pt hitting his head but cleared for fractures.  Had recent admission for positive covid test, still testing positive and SOB.  Has PVC's on telemetry, CHF exacerbation, LE edema. S/p DCCV 10/21. PMHx:  CAD s/p CABG, CKD stage IIIb, DM2, ischemic cardiomyopathy with systolic and diastolic congestive heart failure, moderate aortic stenosis, paroxysmal atrial fibrillation, HTN, gout.    PT Comments    Pt now making steady progress with mobility. Activity tolerance remains low and unable to mobilize on his own. Continue to recommend SNF.    Recommendations for follow up therapy are one component of a multi-disciplinary discharge planning process, led by the attending physician.  Recommendations may be updated based on patient status, additional functional criteria and insurance authorization.  Follow Up Recommendations  Skilled nursing-short term rehab (<3 hours/day)     Assistance Recommended at Discharge Frequent or constant Supervision/Assistance  Equipment Recommendations  None recommended by PT    Recommendations for Other Services       Precautions / Restrictions Precautions Precautions: Fall     Mobility  Bed Mobility Overal bed mobility: Needs Assistance Bed Mobility: Supine to Sit;Sit to Supine     Supine to sit: Min guard;HOB elevated Sit to supine: Min guard;HOB elevated   General bed mobility comments: Incr time to perform    Transfers Overall transfer level: Needs assistance Equipment used: Rollator (4 wheels) Transfers: Sit to/from Stand Sit to Stand: Min assist           General transfer comment: Assist to bring hips up and for balance. Incr time to rise    Ambulation/Gait Ambulation/Gait assistance: Min guard Gait Distance (Feet): 180  Feet Assistive device: Rollator (4 wheels) Gait Pattern/deviations: Step-through pattern;Decreased stride length;Trunk flexed Gait velocity: decreased Gait velocity interpretation: <1.8 ft/sec, indicate of risk for recurrent falls General Gait Details: Assist for safety. Dyspnea 2/4 with amb and HR maintained in the 90's. Incr fatigue as distance increased   Stairs             Wheelchair Mobility    Modified Rankin (Stroke Patients Only)       Balance Overall balance assessment: Needs assistance;History of Falls Sitting-balance support: Feet supported;No upper extremity supported Sitting balance-Leahy Scale: Good     Standing balance support: Bilateral upper extremity supported;During functional activity Standing balance-Leahy Scale: Poor Standing balance comment: rollator and min guard for static standing                            Cognition Arousal/Alertness: Awake/alert Behavior During Therapy: WFL for tasks assessed/performed Overall Cognitive Status: Within Functional Limits for tasks assessed                                          Exercises      General Comments        Pertinent Vitals/Pain Pain Assessment: No/denies pain    Home Living                          Prior Function            PT Goals (current goals can now  be found in the care plan section) Progress towards PT goals: Progressing toward goals    Frequency    Min 2X/week      PT Plan Current plan remains appropriate;Frequency needs to be updated    Co-evaluation              AM-PAC PT "6 Clicks" Mobility   Outcome Measure  Help needed turning from your back to your side while in a flat bed without using bedrails?: A Little Help needed moving from lying on your back to sitting on the side of a flat bed without using bedrails?: A Little Help needed moving to and from a bed to a chair (including a wheelchair)?: A Little Help  needed standing up from a chair using your arms (e.g., wheelchair or bedside chair)?: A Little Help needed to walk in hospital room?: A Little Help needed climbing 3-5 steps with a railing? : Total 6 Click Score: 16    End of Session Equipment Utilized During Treatment: Gait belt Activity Tolerance: Patient limited by fatigue Patient left: in bed;with bed alarm set;with call bell/phone within reach   PT Visit Diagnosis: Muscle weakness (generalized) (M62.81);History of falling (Z91.81);Other abnormalities of gait and mobility (R26.89)     Time: 1607-3710 PT Time Calculation (min) (ACUTE ONLY): 13 min  Charges:  $Gait Training: 8-22 mins                     [place] Pager [phone] Office [place] 01/12/2021, 4:53 PM

## 2021-01-12 NOTE — Progress Notes (Addendum)
PROGRESS NOTE    [name]  WUX:[id number] DOB: 23-Aug-1928 DOA: 01/01/2021  PCP: Burnis Medin, MD   Brief Narrative:  This 87 y.o. 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  Recently discharged on 12/27/2020 after CHF exacerbation and has continued to become progressively more short of breath since this time, he developed shortness of breath along with some dry cough, work-up suggestive of bronchitis along with underlying CHF.  Patient was admitted for further work-up.   10/25: Patient was significantly improving from cardiac standpoint and heart failure however went back into atrial fibrillation with RVR , IV amiodarone resumed. 10/26: Patient underwent successful DCCV to normal sinus rhythm. 10/27: Remains in Normal sinus rhythm.   Assessment & Plan:   Principal Problem:   Acute on chronic systolic CHF (congestive heart failure) (HCC) Active Problems:   Hyperlipidemia   Obstructive sleep apnea   RESTLESS LEG SYNDROME   Essential hypertension   CAD (coronary artery disease)   Iron deficiency anemia   AF (paroxysmal atrial fibrillation) (HCC)   Diabetes mellitus type 2, controlled (HCC)   Leukocytosis   COVID-19 virus infection   Acute renal failure superimposed on stage 3b chronic kidney disease (HCC)   Elevated troponin   Acute hypoxic respiratory failure could be sec. to acute on chronic systolic CHF: Ischemic cardiomyopathy: Patient initially placed on IV Lasix and milrinone,  now has been transitioned to oral torsemide 40 mg daily. Cardiology/CHF team following. Continue digoxin, Farxiga, Aldactone 25 mg daily. Blood pressure has improved, Continue to hold Coreg, hydralazine. PT OT recommending SNF for rehab before returning home .   Paroxysmal A. fib with  RVR: Patient underwent DCCV on 10/21, remained in normal sinus rhythm until back in A. fib on 10/25 Started on IV amiodarone.  Continue Eliquis 2.5 mg twice daily. Patient underwent successful DCCV to normal sinus rhythm on 10/26. Remains in NSR. Amiodarone changed to oral.   Acute bronchitis: He completed course of doxycycline. Continue Mucinex, flutter valve and supportive care Currently stable, no wheezing.   Recent COVID-19 infection: COVID+ on 12/24/2020, has received monoclonal antibody and remdesivir. No acute issues, has been off isolation.   AKI on CKD stage IIIb : Baseline creatinine 2.15 Creatinine around baseline 2.0   Essential hypertension Blood pressure has improved . Hold Coreg and hydralazine   Obstructive sleep apnea Continue CPAP   Iron deficiency anemia Hemoglobin is stable   Restless leg syndrome Continue Mirapex   Type 2 diabetes. CBGs elevated, in 200s Change sliding scale to moderate, continue Semglee 30 units at bedtime, continue Farxiga    Pressure injury, left arm anterior upper skin tear -Present on admission, wound care per nursing Right upper extremity skin tear, wound care was consulted    DVT prophylaxis: Eliquis Code Status: DNR Family Communication:  No family at bed side. Disposition Plan:    Status is: Inpatient  Remains inpatient appropriate because: A. fib, underwent DCCV,  remains in NSR.  Anticipated discharge to skilled nursing facility tomorrow.    Consultants:  Cardiology  Procedures: Echocardiogram, DCCV Antimicrobials:   Anti-infectives (From admission, onward)    Start     Dose/Rate Route Frequency Ordered Stop   01/03/21 1000  azithromycin (ZITHROMAX) tablet 250 mg  Status:  Discontinued  250 mg Oral Daily 01/02/21 1049 01/02/21 1119   01/02/21 1130  doxycycline (VIBRA-TABS) tablet 100 mg        100 mg Oral Every 12 hours 01/02/21 1119 01/06/21 2034   01/02/21 1100  azithromycin (ZITHROMAX) tablet  500 mg  Status:  Discontinued        500 mg Oral  Once 01/02/21 1048 01/02/21 1119        Subjective: Patient was seen and examined at bedside.  Overnight events noted.   Patient underwent successful DCCV to NSR 10/26.  Patient reports feeling better but he states he is feeling weak,  denies any chest pain.  Objective: Vitals:   01/11/21 2033 01/12/21 0042 01/12/21 0357 01/12/21 0835  BP: 132/65  (!) 111/53 119/62  Pulse: 77  71 72  Resp: 15  19   Temp: 97.9 F (36.6 C)  97.9 F (36.6 C) (!) 97.5 F (36.4 C)  TempSrc: Oral  Oral Oral  SpO2: 100%  96% 96%  Weight:  77 kg    Height:        Intake/Output Summary (Last 24 hours) at 01/12/2021 1320 Last data filed at 01/12/2021 1318 Gross per 24 hour  Intake 1156.25 ml  Output 1850 ml  Net -693.75 ml   Filed Weights   01/10/21 0400 01/11/21 0010 01/12/21 0042  Weight: 78 kg 76.5 kg 77 kg    Examination:  General exam: Appears comfortable, not in any acute distress.  Deconditioned Respiratory system: Clear to auscultation. Respiratory effort normal. Cardiovascular system: S1-S2 heard, regular rate and rhythm, no murmur,  Gastrointestinal system: Abdomen soft, nontender, nondistended, BS +. Central nervous system: Alert and oriented. No focal neurological deficits. Extremities: No edema, no cyanosis, no clubbing. Skin: No rashes, lesions or ulcers Psychiatry: Judgement and insight appear normal. Mood & affect appropriate.     Data Reviewed: I have personally reviewed following labs and imaging studies  CBC: Recent Labs  Lab 01/06/21 1109 01/09/21 1658 01/12/21 0330  WBC 8.9 11.7* 11.6*  NEUTROABS 6.5  --   --   HGB 8.4* 9.4* 8.4*  HCT 28.4* 32.1* 28.1*  MCV 84.0 83.2 82.9  PLT 276 276 462   Basic Metabolic Panel: Recent Labs  Lab 01/06/21 0358 01/07/21 0441 01/08/21 0500 01/09/21 0330 01/10/21 0450 01/11/21 0602 01/12/21 0330  NA 133*   < > 136 135 134* 135 134*  K 3.5   < > 3.9 3.6 4.0 3.8 4.0   CL 97*   < > 102 101 99 100 99  CO2 25   < > 26 26 28 28 27   GLUCOSE 231*   < > 81 139* 181* 58* 91  BUN 79*   < > 68* 68* 63* 63* 67*  CREATININE 2.71*   < > 2.11* 2.14* 2.08* 2.14* 2.51*  CALCIUM 8.1*   < > 8.5* 8.2* 8.5* 8.5* 8.4*  MG 1.7  --  2.0  --   --  2.2 1.9  PHOS  --   --   --   --   --   --  3.4   < > = values in this interval not displayed.   GFR: Estimated Creatinine Clearance: 21.3 mL/min (A) (by C-G formula based on SCr of 2.51 mg/dL (H)). Liver Function Tests: Recent Labs  Lab 01/06/21 0358  AST 13*  ALT 16  ALKPHOS 77  BILITOT 0.7  PROT 4.8*  ALBUMIN 2.1*   No results for input(s): LIPASE, AMYLASE in the last 168 hours.  No results for input(s): AMMONIA in the last 168 hours. Coagulation Profile: Recent Labs  Lab 01/11/21 0602  INR 1.4*   Cardiac Enzymes: No results for input(s): CKTOTAL, CKMB, CKMBINDEX, TROPONINI in the last 168 hours. BNP (last 3 results) No results for input(s): PROBNP in the last 8760 hours. HbA1C: No results for input(s): HGBA1C in the last 72 hours. CBG: Recent Labs  Lab 01/11/21 1334 01/11/21 1537 01/11/21 2109 01/12/21 0615 01/12/21 1105  GLUCAP 80 189* 295* 75 139*   Lipid Profile: No results for input(s): CHOL, HDL, LDLCALC, TRIG, CHOLHDL, LDLDIRECT in the last 72 hours. Thyroid Function Tests: No results for input(s): TSH, T4TOTAL, FREET4, T3FREE, THYROIDAB in the last 72 hours. Anemia Panel: No results for input(s): VITAMINB12, FOLATE, FERRITIN, TIBC, IRON, RETICCTPCT in the last 72 hours. Sepsis Labs: No results for input(s): PROCALCITON, LATICACIDVEN in the last 168 hours.  No results found for this or any previous visit (from the past 240 hour(s)).   Radiology Studies: No results found.  Scheduled Meds:  allopurinol  100 mg Oral Daily   amiodarone  200 mg Oral BID   apixaban  2.5 mg Oral BID   atorvastatin  40 mg Oral Daily   Chlorhexidine Gluconate Cloth  6 each Topical Daily   dapagliflozin  propanediol  10 mg Oral QAC breakfast   digoxin  0.0625 mg Oral Q M,W,F   escitalopram  10 mg Oral Daily   gabapentin  100 mg Oral BID   guaiFENesin  600 mg Oral BID   insulin aspart  0-15 Units Subcutaneous TID WC   insulin aspart  0-5 Units Subcutaneous QHS   insulin glargine-yfgn  25 Units Subcutaneous QHS   isosorbide mononitrate  15 mg Oral Daily   loratadine  10 mg Oral Daily   pramipexole  0.25 mg Oral Q24H   sodium chloride flush  10-40 mL Intracatheter Q12H   spironolactone  25 mg Oral Daily   tamsulosin  0.4 mg Oral QPC breakfast   [START ON 01/13/2021] torsemide  40 mg Oral Daily   vitamin B-12  1,000 mcg Oral Daily   Continuous Infusions:     LOS: 11 days    Time spent: 25 mins    Shawna Clamp, MD [facility]   If 7PM-7AM, please contact night-coverage

## 2021-01-12 NOTE — Progress Notes (Addendum)
Patient ID: [name], male   DOB: Apr 01, 1928, 87 y.o.   MRN: [id number]     Advanced Heart Failure Rounding Note  PCP-Cardiologist: None   Subjective:    10/26 DCCV to NSR.   A-V paced on tele this am. HR 70s.   Wt stable today. CVP 8   Co-ox 62%.   SCr up 2.2>>2.5   No dyspnea. No CP. Appetite is good. Waiting on SNF.   Objective:   Weight Range: 77 kg Body mass index is 21.8 kg/m.   Vital Signs:   Temp:  [96.7 F (35.9 C)-97.9 F (36.6 C)] 97.9 F (36.6 C) (10/27 0357) Pulse Rate:  [63-77] 71 (10/27 0357) Resp:  [13-19] 19 (10/27 0357) BP: (105-136)/(47-77) 111/53 (10/27 0357) SpO2:  [95 %-100 %] 96 % (10/27 0357) Weight:  [77 kg] 77 kg (10/27 0042) Last BM Date: 01/11/21  Weight change: Filed Weights   01/10/21 0400 01/11/21 0010 01/12/21 0042  Weight: 78 kg 76.5 kg 77 kg    Intake/Output:   Intake/Output Summary (Last 24 hours) at 01/12/2021 0753 Last data filed at 01/12/2021 0600 Gross per 24 hour  Intake 1822.19 ml  Output 1350 ml  Net 472.19 ml      Physical Exam   CVP 8  General:  Well appearing elderly WM No respiratory difficulty HEENT: normal Neck: supple. JVD 8 cm. Carotids 2+ bilat; no bruits. No lymphadenopathy or thyromegaly appreciated. Cor: PMI nondisplaced. Regular rate & rhythm. No rubs, gallops or murmurs. Lungs: clear Abdomen: soft, nontender, nondistended. No hepatosplenomegaly. No bruits or masses. Good bowel sounds. Extremities: no cyanosis, clubbing, rash, edema + RUE PICC, Rt forearm wrapped  Neuro: alert & oriented x 3, cranial nerves grossly intact. moves all 4 extremities w/o difficulty. Affect pleasant.   Telemetry   A-V Paced 70s Personally reviewed   Labs    CBC Recent Labs    01/09/21 1658 01/12/21 0330  WBC 11.7* 11.6*  HGB 9.4* 8.4*  HCT 32.1* 28.1*  MCV 83.2 82.9  PLT 276 413   Basic Metabolic Panel Recent Labs    01/11/21 0602 01/12/21 0330  NA 135 134*  K 3.8 4.0  CL 100 99  CO2  28 27  GLUCOSE 58* 91  BUN 63* 67*  CREATININE 2.14* 2.51*  CALCIUM 8.5* 8.4*  MG 2.2 1.9  PHOS  --  3.4   Liver Function Tests No results for input(s): AST, ALT, ALKPHOS, BILITOT, PROT, ALBUMIN in the last 72 hours. No results for input(s): LIPASE, AMYLASE in the last 72 hours. Cardiac Enzymes No results for input(s): CKTOTAL, CKMB, CKMBINDEX, TROPONINI in the last 72 hours.  BNP: BNP (last 3 results) Recent Labs    01/04/21 0411 01/05/21 0350 01/06/21 1109  BNP 2,990.9* 1,925.1* 993.7*    ProBNP (last 3 results) No results for input(s): PROBNP in the last 8760 hours.   D-Dimer No results for input(s): DDIMER in the last 72 hours. Hemoglobin A1C No results for input(s): HGBA1C in the last 72 hours. Fasting Lipid Panel No results for input(s): CHOL, HDL, LDLCALC, TRIG, CHOLHDL, LDLDIRECT in the last 72 hours. Thyroid Function Tests No results for input(s): TSH, T4TOTAL, T3FREE, THYROIDAB in the last 72 hours.  Invalid input(s): FREET3  Other results:   Imaging    No results found.   Medications:     Scheduled Medications:  allopurinol  100 mg Oral Daily   apixaban  2.5 mg Oral BID   atorvastatin  40 mg Oral Daily  Chlorhexidine Gluconate Cloth  6 each Topical Daily   dapagliflozin propanediol  10 mg Oral QAC breakfast   digoxin  0.0625 mg Oral Q M,W,F   escitalopram  10 mg Oral Daily   gabapentin  100 mg Oral BID   guaiFENesin  600 mg Oral BID   insulin aspart  0-15 Units Subcutaneous TID WC   insulin aspart  0-5 Units Subcutaneous QHS   insulin glargine-yfgn  25 Units Subcutaneous QHS   isosorbide mononitrate  15 mg Oral Daily   loratadine  10 mg Oral Daily   pramipexole  0.25 mg Oral Q24H   sodium chloride flush  10-40 mL Intracatheter Q12H   spironolactone  25 mg Oral Daily   tamsulosin  0.4 mg Oral QPC breakfast   torsemide  40 mg Oral Daily   vitamin B-12  1,000 mcg Oral Daily    Infusions:  amiodarone 30 mg/hr (01/12/21 0038)      PRN Medications: acetaminophen **OR** acetaminophen, guaiFENesin-dextromethorphan, lidocaine    Assessment/Plan   1. Acute on chronic systolic CHF: Ischemic cardiomyopathy.  Echo in 5/22 with EF 20-25%, low gradient moderate AS with mean gradient 12 mmHg and AVA 1.1. [employer] CRT-D device.  CHF exacerbation in setting of recurrent atrial fibrillation and loss of effective CRT.  Creatinine stable around 2.1.  Milrinone stopped 10/22, Co-ox 62% which has been stable. CVP 8.  He was cardioverted back to NSR 10/26.   - Continue torsemide 40 mg daily (on 20 mg daily at home).  - Continue digoxin 0.0625 mg qMWF - Continue Farxiga 10 mg daily.  - Continue spironolactone 25 daily.  - Continue Imdur 15 mg daily  - Hold Coreg and hydral for now 2. Atrial fibrillation: Paroxysmal.  Went into AF on 10/9, likely triggered by COVID-19 infection.  Loss of effective CRT with AF.  Back in NSR after DC-CV on 10/21. Back in Afib 10/25. DCCV again 10/26 and now in NSR.  - Switch back to PO amio today, 200 mg bid x 7 days>>200 mg daily  - On Eliquis 2.5 bid.  3. AKI on CKD stage 4: Baseline creatinine around 2.1.  Creatinine 2.85 => 2.71=> 2.27 => 2.11 => 2.14=>2.08 => 2.14 =>2.4 today.  4. COVID-19: Admitted initially 10/8 with COVID-19. Received monoclonal ab and remdesivir initially. Still with cough though CHF likely contributes to this.  - Now off precautions.  5. CAD: s/p CABG.  No chest pain.  HS-TnI minimally elevated with no trend, likely demand ischemia. No chest pain.  - Continue atorvastatin. - Not on ASA with stable CAD and apixaban use.  6. Skin tear on RUE  -  Wound Care consulted. Bedside RN to manage 7. Deconditioning - Work w/ PT - Needs SNF. SW assisting w/ placement   Stable for d/c to SNF once bed available.   Cardiac Meds for Discharge Apixaban 2.5 mg bid Atorvastatin 40 mg daily  Amiodarone 200 mg bid x 7 days>>200 mg daily  Farxiga 10 mg daily  Digoxin 0.0625 MWF Imdur  15 mg daily  Spironolactone 25 mg daily  Torsemide 40 mg daily   Post hospital f/u arranged in the [facility] Appt info in AVS   Length of Stay: [address], PA-C  01/12/2021, 7:53 AM  Advanced Heart Failure Team Pager [phone] (M-F; 7a - 5p)  Please contact [facility] for night-coverage after hours (5p -7a ) and weekends on [email or web address]  Patient seen with PA, agree with the above note.   He  was cardioverted yesterday, he is a-BiV sequentially paced today.  Feels "fine."  Creatinine up a bit to 2.4. CVP 8, co-ox 62%.   General: NAD Neck: JVP 8 cm, no thyromegaly or thyroid nodule.  Lungs: Clear to auscultation bilaterally with normal respiratory effort. CV: Nondisplaced PMI.  Heart regular S1/S2, no S3/S4, no murmur.  No peripheral edema.  No carotid bruit.   Abdomen: Soft, nontender, no hepatosplenomegaly, no distention.  Skin: Skin tear right forearm.  Neurologic: Alert and oriented x 3.  Psych: Normal affect. Extremities: No clubbing or cyanosis.  HEENT: Normal.   Would hold 1 dose of torsemide today with creatinine higher, restart 40 mg daily tomorrow.   I think he is ready for SNF today on the above meds.  He is staying out of atrial fibrillation.  Resume po amiodarone.   [name] 01/12/2021 10:15 AM

## 2021-01-13 DIAGNOSIS — J9811 Atelectasis: Secondary | ICD-10-CM | POA: Diagnosis not present

## 2021-01-13 DIAGNOSIS — I480 Paroxysmal atrial fibrillation: Secondary | ICD-10-CM | POA: Diagnosis not present

## 2021-01-13 DIAGNOSIS — U071 COVID-19: Secondary | ICD-10-CM | POA: Diagnosis not present

## 2021-01-13 DIAGNOSIS — M86141 Other acute osteomyelitis, right hand: Secondary | ICD-10-CM | POA: Diagnosis not present

## 2021-01-13 DIAGNOSIS — D649 Anemia, unspecified: Secondary | ICD-10-CM | POA: Diagnosis not present

## 2021-01-13 DIAGNOSIS — J90 Pleural effusion, not elsewhere classified: Secondary | ICD-10-CM | POA: Diagnosis not present

## 2021-01-13 DIAGNOSIS — D509 Iron deficiency anemia, unspecified: Secondary | ICD-10-CM | POA: Diagnosis not present

## 2021-01-13 DIAGNOSIS — I5042 Chronic combined systolic (congestive) and diastolic (congestive) heart failure: Secondary | ICD-10-CM | POA: Diagnosis not present

## 2021-01-13 DIAGNOSIS — K219 Gastro-esophageal reflux disease without esophagitis: Secondary | ICD-10-CM | POA: Diagnosis not present

## 2021-01-13 DIAGNOSIS — Z8616 Personal history of COVID-19: Secondary | ICD-10-CM | POA: Diagnosis not present

## 2021-01-13 DIAGNOSIS — Z951 Presence of aortocoronary bypass graft: Secondary | ICD-10-CM | POA: Diagnosis not present

## 2021-01-13 DIAGNOSIS — L97519 Non-pressure chronic ulcer of other part of right foot with unspecified severity: Secondary | ICD-10-CM | POA: Diagnosis not present

## 2021-01-13 DIAGNOSIS — I5022 Chronic systolic (congestive) heart failure: Secondary | ICD-10-CM | POA: Diagnosis not present

## 2021-01-13 DIAGNOSIS — E11621 Type 2 diabetes mellitus with foot ulcer: Secondary | ICD-10-CM | POA: Diagnosis not present

## 2021-01-13 DIAGNOSIS — E1122 Type 2 diabetes mellitus with diabetic chronic kidney disease: Secondary | ICD-10-CM | POA: Diagnosis not present

## 2021-01-13 DIAGNOSIS — E7849 Other hyperlipidemia: Secondary | ICD-10-CM | POA: Diagnosis not present

## 2021-01-13 DIAGNOSIS — N1832 Chronic kidney disease, stage 3b: Secondary | ICD-10-CM | POA: Diagnosis not present

## 2021-01-13 DIAGNOSIS — Z66 Do not resuscitate: Secondary | ICD-10-CM | POA: Diagnosis not present

## 2021-01-13 DIAGNOSIS — Z95810 Presence of automatic (implantable) cardiac defibrillator: Secondary | ICD-10-CM | POA: Diagnosis not present

## 2021-01-13 DIAGNOSIS — I4510 Unspecified right bundle-branch block: Secondary | ICD-10-CM | POA: Diagnosis not present

## 2021-01-13 DIAGNOSIS — R5381 Other malaise: Secondary | ICD-10-CM | POA: Diagnosis not present

## 2021-01-13 DIAGNOSIS — N179 Acute kidney failure, unspecified: Secondary | ICD-10-CM | POA: Diagnosis not present

## 2021-01-13 DIAGNOSIS — R0989 Other specified symptoms and signs involving the circulatory and respiratory systems: Secondary | ICD-10-CM | POA: Diagnosis not present

## 2021-01-13 DIAGNOSIS — I5043 Acute on chronic combined systolic (congestive) and diastolic (congestive) heart failure: Secondary | ICD-10-CM | POA: Diagnosis not present

## 2021-01-13 DIAGNOSIS — G2581 Restless legs syndrome: Secondary | ICD-10-CM | POA: Diagnosis not present

## 2021-01-13 DIAGNOSIS — M79671 Pain in right foot: Secondary | ICD-10-CM | POA: Diagnosis not present

## 2021-01-13 DIAGNOSIS — I255 Ischemic cardiomyopathy: Secondary | ICD-10-CM | POA: Diagnosis not present

## 2021-01-13 DIAGNOSIS — I130 Hypertensive heart and chronic kidney disease with heart failure and stage 1 through stage 4 chronic kidney disease, or unspecified chronic kidney disease: Secondary | ICD-10-CM | POA: Diagnosis not present

## 2021-01-13 DIAGNOSIS — I350 Nonrheumatic aortic (valve) stenosis: Secondary | ICD-10-CM | POA: Diagnosis not present

## 2021-01-13 DIAGNOSIS — N184 Chronic kidney disease, stage 4 (severe): Secondary | ICD-10-CM | POA: Diagnosis not present

## 2021-01-13 DIAGNOSIS — I700 Atherosclerosis of aorta: Secondary | ICD-10-CM | POA: Diagnosis not present

## 2021-01-13 DIAGNOSIS — I2510 Atherosclerotic heart disease of native coronary artery without angina pectoris: Secondary | ICD-10-CM | POA: Diagnosis not present

## 2021-01-13 DIAGNOSIS — I509 Heart failure, unspecified: Secondary | ICD-10-CM | POA: Diagnosis not present

## 2021-01-13 DIAGNOSIS — G4733 Obstructive sleep apnea (adult) (pediatric): Secondary | ICD-10-CM | POA: Diagnosis not present

## 2021-01-13 DIAGNOSIS — J209 Acute bronchitis, unspecified: Secondary | ICD-10-CM | POA: Diagnosis not present

## 2021-01-13 DIAGNOSIS — I872 Venous insufficiency (chronic) (peripheral): Secondary | ICD-10-CM | POA: Diagnosis not present

## 2021-01-13 DIAGNOSIS — E1159 Type 2 diabetes mellitus with other circulatory complications: Secondary | ICD-10-CM | POA: Diagnosis not present

## 2021-01-13 DIAGNOSIS — S91302A Unspecified open wound, left foot, initial encounter: Secondary | ICD-10-CM | POA: Diagnosis not present

## 2021-01-13 DIAGNOSIS — L89890 Pressure ulcer of other site, unstageable: Secondary | ICD-10-CM | POA: Diagnosis not present

## 2021-01-13 DIAGNOSIS — I517 Cardiomegaly: Secondary | ICD-10-CM | POA: Diagnosis not present

## 2021-01-13 DIAGNOSIS — J9690 Respiratory failure, unspecified, unspecified whether with hypoxia or hypercapnia: Secondary | ICD-10-CM | POA: Diagnosis not present

## 2021-01-13 DIAGNOSIS — Z7984 Long term (current) use of oral hypoglycemic drugs: Secondary | ICD-10-CM | POA: Diagnosis not present

## 2021-01-13 DIAGNOSIS — E119 Type 2 diabetes mellitus without complications: Secondary | ICD-10-CM | POA: Diagnosis not present

## 2021-01-13 DIAGNOSIS — S81801A Unspecified open wound, right lower leg, initial encounter: Secondary | ICD-10-CM | POA: Diagnosis not present

## 2021-01-13 DIAGNOSIS — I4891 Unspecified atrial fibrillation: Secondary | ICD-10-CM | POA: Diagnosis not present

## 2021-01-13 DIAGNOSIS — Z7901 Long term (current) use of anticoagulants: Secondary | ICD-10-CM | POA: Diagnosis not present

## 2021-01-13 DIAGNOSIS — R54 Age-related physical debility: Secondary | ICD-10-CM | POA: Diagnosis not present

## 2021-01-13 DIAGNOSIS — I5023 Acute on chronic systolic (congestive) heart failure: Secondary | ICD-10-CM | POA: Diagnosis not present

## 2021-01-13 DIAGNOSIS — Z794 Long term (current) use of insulin: Secondary | ICD-10-CM | POA: Diagnosis not present

## 2021-01-13 DIAGNOSIS — Z79899 Other long term (current) drug therapy: Secondary | ICD-10-CM | POA: Diagnosis not present

## 2021-01-13 DIAGNOSIS — X58XXXA Exposure to other specified factors, initial encounter: Secondary | ICD-10-CM | POA: Diagnosis not present

## 2021-01-13 DIAGNOSIS — I152 Hypertension secondary to endocrine disorders: Secondary | ICD-10-CM | POA: Diagnosis not present

## 2021-01-13 DIAGNOSIS — D692 Other nonthrombocytopenic purpura: Secondary | ICD-10-CM | POA: Diagnosis not present

## 2021-01-13 DIAGNOSIS — N189 Chronic kidney disease, unspecified: Secondary | ICD-10-CM | POA: Diagnosis not present

## 2021-01-13 DIAGNOSIS — E118 Type 2 diabetes mellitus with unspecified complications: Secondary | ICD-10-CM | POA: Diagnosis not present

## 2021-01-13 DIAGNOSIS — I429 Cardiomyopathy, unspecified: Secondary | ICD-10-CM | POA: Diagnosis not present

## 2021-01-13 DIAGNOSIS — J9601 Acute respiratory failure with hypoxia: Secondary | ICD-10-CM | POA: Diagnosis not present

## 2021-01-13 DIAGNOSIS — F324 Major depressive disorder, single episode, in partial remission: Secondary | ICD-10-CM | POA: Diagnosis not present

## 2021-01-13 LAB — BASIC METABOLIC PANEL
Anion gap: 7 (ref 5–15)
BUN: 63 mg/dL — ABNORMAL HIGH (ref 8–23)
CO2: 28 mmol/L (ref 22–32)
Calcium: 8.5 mg/dL — ABNORMAL LOW (ref 8.9–10.3)
Chloride: 102 mmol/L (ref 98–111)
Creatinine, Ser: 2.38 mg/dL — ABNORMAL HIGH (ref 0.61–1.24)
GFR, Estimated: 25 mL/min — ABNORMAL LOW (ref >60)
Glucose, Bld: 234 mg/dL — ABNORMAL HIGH (ref 70–99)
Potassium: 4.1 mmol/L (ref 3.5–5.1)
Sodium: 137 mmol/L (ref 135–145)

## 2021-01-13 LAB — GLUCOSE, CAPILLARY
Glucose-Capillary: 58 mg/dL — ABNORMAL LOW (ref 70–99)
Glucose-Capillary: 121 mg/dL — ABNORMAL HIGH (ref 70–99)
Glucose-Capillary: 116 mg/dL — ABNORMAL HIGH (ref 70–99)

## 2021-01-13 MED ORDER — AMIODARONE HCL 200 MG PO TABS: 200.0000 mg | ORAL_TABLET | Freq: Two times a day (BID) | ORAL | 1 refills | Status: DC

## 2021-01-13 MED ORDER — ISOSORBIDE MONONITRATE ER 30 MG PO TB24: 15.0000 mg | ORAL_TABLET | Freq: Every day | ORAL | 1 refills | Status: AC

## 2021-01-13 MED ORDER — TORSEMIDE 40 MG PO TABS: 40.0000 mg | ORAL_TABLET | Freq: Every day | ORAL | 1 refills | Status: DC

## 2021-01-13 MED ORDER — DAPAGLIFLOZIN PROPANEDIOL 10 MG PO TABS: 10.0000 mg | ORAL_TABLET | Freq: Every day | ORAL | 1 refills | Status: DC

## 2021-01-13 MED ORDER — DIGOXIN 62.5 MCG PO TABS: 0.0625 mg | ORAL_TABLET | ORAL | 1 refills | Status: DC

## 2021-01-13 NOTE — TOC Progression Note (Addendum)
Transition of Care [facility]) - Progression Note    Patient Details  Name: [name] MRN: [id number] Date of Birth: 1928-05-02  Transition of Care [facility]) CM/SW [place], [place] Phone Number: 01/13/2021, 10:21 AM  Clinical Narrative:    HF CSW spoke with Dr. Dwyane Dee regarding [name] discharging today and that he is medically ready. [name] daughter [name] [phone] called to confirm that her dad will be discharging to [place] today. CSW called [facility] at [facility] to confirm discharge and they are ready for [name] to come today. [name] is headed to an EKG before discharge today. CSW received a call from [facility] at [facility] to see about an eta for [name] as they need him there by 4pm. CSW will try to get transport by that time. Awaiting to hear from Cardiology regarding results of EKG.  CSW will continue to follow throughout discharge.   Expected Discharge Plan: [place] Barriers to Discharge: Continued Medical Work up  Expected Discharge Plan and Services Expected Discharge Plan: [place] In-house Referral: Clinical Social Work Discharge Planning Services: CM Consult Post Acute Care Choice: [place] arrangements for the past 2 months: Single Family Home Expected Discharge Date: 01/13/21                                     Social Determinants of Health (SDOH) Interventions    Readmission Risk Interventions No flowsheet data found.

## 2021-01-13 NOTE — Discharge Summary (Signed)
Physician Discharge Summary  [name] OMV:[id number] DOB: 12/19/1928 DOA: 01/01/2021  PCP: [name] Medin, MD  Admit date: 01/01/2021  Discharge date: 01/13/2021  Admitted From: SNF  Disposition:  SNF [name])  Recommendations for Outpatient Follow-up:  Follow up with PCP in 1-2 weeks. Please obtain BMP/CBC in one week. Advised to follow-up with cardiology/CHF team as scheduled. Hydralazine and Coreg were discontinued during hospitalization.   Cardiac Discharge medications Apixaban 2.5 mg bid. Atorvastatin 40 mg daily.  Amiodarone 200 mg bid x 7 days>>200 mg daily . Farxiga 10 mg daily.  Digoxin 0.0625 MWF. Imdur 15 mg daily . Spironolactone 25 mg daily . Torsemide 40 mg daily .   Post [name] f/u arranged in the [name]. Appt info in AVS     Home Health: None Equipment/Devices: None  Discharge Condition: Stable CODE STATUS: DNR Diet recommendation: Heart Healthy   Brief Summary / [name] Course: This 87 y.o. male with history of CAD, status post CABG, CKD stage IIIb, DM type II, ischemic cardiomyopathy, combined CHF (EF 20 to 25% with G1 DD on echo 5/22, moderate aortic stenosis, pAfib s/p DCCV in 05/2014 presented to ED with worsening shortness of breath. He was recently  admitted and discharged on 12/27/2020 after CHF exacerbation and has continued to become progressively more short of breath since this time, he developed shortness of breath along with some dry cough, work-up suggestive of bronchitis along with underlying CHF.   Patient was admitted for CHF exacerbation and A. fib with RVR.  Cardiology was consulted, Patient underwent DCCV to normal sinus rhythm on 10/21.  He was cleared to be discharged from heart failure perspective and then went back to A. fib with RVR.  [name] course was prolonged.  Patient underwent successful Repeat DCCV to normal sinus rhythm on 10/26.  He remained in normal sinus rhythm, Cleared from cardiology .   Patient feels  better and patient is being [name].  Patient will continue to follow-up with cardiology.  10/25: Patient was significantly improving from cardiac standpoint and heart failure however went back into atrial fibrillation with RVR , IV amiodarone resumed. 10/26: Patient underwent successful DCCV to normal sinus rhythm. 10/27: Remains in Normal sinus rhythm. 10/28 : back in Atrial fibrillation but HR controlled.  Patient is being discharged to skilled nursing facility.  He was managed for below problems.  Discharge Diagnoses:  Principal Problem:   Acute on chronic systolic CHF (congestive heart failure) (HCC) Active Problems:   Hyperlipidemia   Obstructive sleep apnea   RESTLESS LEG SYNDROME   Essential hypertension   CAD (coronary artery disease)   Iron deficiency anemia   AF (paroxysmal atrial fibrillation) (HCC)   Diabetes mellitus type 2, controlled (HCC)   Leukocytosis   COVID-19 virus infection   Acute renal failure superimposed on stage 3b chronic kidney disease (HCC)   Elevated troponin  Acute hypoxic respiratory failure could be sec. to acute on chronic systolic CHF: Ischemic cardiomyopathy: Patient initially placed on IV Lasix and milrinone,  now has been transitioned to oral torsemide 40 mg daily. Cardiology/CHF team following. Continue digoxin, Farxiga, Aldactone 25 mg daily. Blood pressure has improved, Continue to hold Coreg, hydralazine. PT OT recommending SNF for rehab before returning home .   Paroxysmal A. fib with RVR: Patient underwent DCCV on 10/21, remained in normal sinus rhythm until back in A. fib on 10/25 Started on IV amiodarone.  Continue Eliquis 2.5 mg twice daily. Patient  underwent successful DCCV to normal sinus rhythm on 10/26. Remains in NSR. Amiodarone changed to oral.   Acute bronchitis: He completed course of doxycycline. Continue Mucinex, flutter valve and supportive care Currently  stable, no wheezing.   Recent COVID-19 infection: COVID+ on 12/24/2020, has received monoclonal antibody and remdesivir. No acute issues, has been off isolation.   AKI on CKD stage IIIb : Baseline creatinine 2.15 Creatinine around baseline 2.0   Essential hypertension Blood pressure has improved . Hold Coreg and hydralazine   Obstructive sleep apnea Continue CPAP   Iron deficiency anemia Hemoglobin is stable   Restless leg syndrome Continue Mirapex   Type 2 diabetes. CBGs elevated, in 200s Change sliding scale to moderate, continue Semglee 30 units at bedtime, continue Farxiga     Pressure injury, left arm anterior upper skin tear -Present on admission, wound care per nursing Right upper extremity skin tear, wound care was consulted      Discharge Instructions  Discharge Instructions     Call MD for:  difficulty breathing, headache or visual disturbances   Complete by: As directed    Call MD for:  persistant dizziness or light-headedness   Complete by: As directed    Call MD for:  persistant nausea and vomiting   Complete by: As directed    Diet - low sodium heart healthy   Complete by: As directed    Diet - low sodium heart healthy   Complete by: As directed    Diet Carb Modified   Complete by: As directed    Discharge instructions   Complete by: As directed    Advised to follow-up with primary care physician in 1 week. Advised to follow-up with cardiology/CHF team as scheduled. Hydralazine and Coreg was discontinued during hospitalization. Other blood pressure and heart medications adjusted. Cardiac Meds for Discharge Apixaban 2.5 mg bid Atorvastatin 40 mg daily  Amiodarone 200 mg bid x 7 days>>200 mg daily  Farxiga 10 mg daily  Digoxin 0.0625 MWF Imdur 15 mg daily  Spironolactone 25 mg daily  Torsemide 40 mg daily    Post [name] f/u arranged in the [name]. Appt info in AVS   Discharge wound care:   Complete by: As directed    Wound care at SNF    Discharge wound care:   Complete by: As directed    Follow-up PCP.   Increase activity slowly   Complete by: As directed    Increase activity slowly   Complete by: As directed       Allergies as of 01/13/2021   No Known Allergies      Medication List     STOP taking these medications    carvedilol 12.5 MG tablet Commonly known as: COREG   hydrALAZINE 25 MG tablet Commonly known as: APRESOLINE       TAKE these medications    Accu-Chek Aviva Plus test strip Generic drug: glucose blood Check blood sugars up to three times daily as needed. Please schedule your follow up with labs with Dr. Regis Bill. [phone] What changed: additional instructions   Accu-Chek Aviva Plus w/Device Kit Check blood sugars daily up to three times daily or as needed. What changed: additional instructions   acetaminophen 650 MG CR tablet Commonly known as: TYLENOL Take 650 mg by mouth 3 (three) times daily.   allopurinol 100 MG tablet Commonly known as: ZYLOPRIM TAKE 1 TABLET DAILY   amiodarone 200 MG tablet Commonly known as: PACERONE Take 1 tablet (200 mg total) by mouth 2 (  two) times daily. What changed: See the new instructions.   apixaban 2.5 MG Tabs tablet Commonly known as: ELIQUIS Take 1 tablet (2.5 mg total) by mouth 2 (two) times daily.   atorvastatin 40 MG tablet Commonly known as: LIPITOR TAKE 1 TABLET DAILY   CORICIDIN HBP COUGH/COLD PO Take 30 mLs by mouth every 4 (four) hours as needed (cough).   dapagliflozin propanediol 10 MG Tabs tablet Commonly known as: Farxiga Take 1 tablet (10 mg total) by mouth daily before breakfast. Start taking on: January 14, 2021   Digoxin 62.5 MCG Tabs Take 0.0625 mg by mouth every Monday, Wednesday, and Friday. Start taking on: January 16, 2021 What changed:  medication strength when to take this   escitalopram 10 MG tablet Commonly known as: LEXAPRO Take 1 tablet (10 mg total) by mouth daily. Please schedule follow up  with Dr. Regis Bill for refills. [phone] Thank you!   gabapentin 100 MG capsule Commonly known as: NEURONTIN Take 1 capsule (100 mg total) by mouth 3 (three) times daily. What changed: when to take this   insulin glargine 100 unit/mL Sopn Commonly known as: LANTUS Inject 30 Units into the skin at bedtime.   Insulin Pen Needle 32G X 4 MM Misc Commonly known as: BD Pen Needle Nano U/F [place] TO TEST BLOOD SUGAR THREE TIMES DAILY What changed: additional instructions   isosorbide mononitrate 30 MG 24 hr tablet Commonly known as: IMDUR Take 0.5 tablets (15 mg total) by mouth daily. What changed:  medication strength how much to take   lidocaine 5 % Commonly known as: Lidoderm Place 1 patch onto the skin daily. Remove & Discard patch within 12 hours or as directed by MD What changed:  when to take this reasons to take this   OneTouch Delica Lancets Fine Misc Use to test blood sugar 2-3 times daily   OVER THE COUNTER MEDICATION Place 1 spray into both nostrils daily as needed (runny nose). OTC nasal spray - unknown name   pramipexole 0.5 MG tablet Commonly known as: MIRAPEX TAKE 1 TABLET 2 TO 3 HOURS BEFORE SLEEP FOR RESTLESS LEG What changed: See the new instructions.   silodosin 8 MG Caps capsule Commonly known as: RAPAFLO TAKE 1 CAPSULE DAILY WITH BREAKFAST What changed: See the new instructions.   spironolactone 25 MG tablet Commonly known as: ALDACTONE TAKE 1 TABLET DAILY What changed: when to take this   Torsemide 40 MG Tabs Take 40 mg by mouth daily. Start taking on: January 14, 2021 What changed:  medication strength See the new instructions.   vitamin B-12 1000 MCG tablet Commonly known as: CYANOCOBALAMIN Take 1 tablet (1,000 mcg total) by mouth daily.               Durable Medical Equipment  (From admission, onward)           Start     Ordered   01/08/21 1342  For home use only DME Bedside commode  Once       Question:  Patient needs a  bedside commode to treat with the following condition  Answer:  Gait instability   01/08/21 1341              Discharge Care Instructions  (From admission, onward)           Start     Ordered   01/13/21 0000  Discharge wound care:       Comments: Wound care at [name]   01/13/21 0924  01/13/21 0000  Discharge wound care:       Comments: Follow-up PCP.   01/13/21 1122            Follow-up Information     [facility] Follow up.   Specialty: Cardiology Why: 11/11 at 1:30 PM [name], [place] information: [address] [id number] Danice Goltz [place]        [name] Medin, MD. Go on 01/25/2021.   Specialties: Internal Medicine, Pediatrics Why: _0 :30pm Contact information: [place] [address] [phone]                No Known Allergies  Consultations: Cardiology / CHF team   Procedures/Studies: CT Head Wo Contrast  Result Date: 01/01/2021 CLINICAL DATA:  Head trauma, mod-severe fall, hematoma to crown of head EXAM: CT HEAD WITHOUT CONTRAST TECHNIQUE: Contiguous axial images were obtained from the base of the skull through the vertex without intravenous contrast. COMPARISON:  12/26/2020 FINDINGS: Brain: There is atrophy and chronic small vessel disease changes. No acute intracranial abnormality. Specifically, no hemorrhage, hydrocephalus, mass lesion, acute infarction, or significant intracranial injury. Vascular: No hyperdense vessel or unexpected calcification. Skull: No acute calvarial abnormality. Sinuses/Orbits: No acute findings Other: None IMPRESSION: Atrophy, chronic microvascular disease. No acute intracranial abnormality. Electronically Signed   By: Rolm Baptise M.D.   On: 01/01/2021 16:43   CT HEAD WO CONTRAST (5MM)  Result Date: 12/26/2020 CLINICAL DATA:  Head and neck trauma  EXAM: CT HEAD WITHOUT CONTRAST TECHNIQUE: Contiguous axial images were obtained from the base of the skull through the vertex without intravenous contrast. COMPARISON:  12/16/2009 FINDINGS: Brain: Progressive confluent hypodensities throughout the periventricular white matter consistent with chronic small vessel ischemic changes. No signs of acute infarct or hemorrhage. Lateral ventricles and midline structures are unremarkable. No acute extra-axial fluid collections. No mass effect. Vascular: Prominent atherosclerosis of the internal carotid arteries. No hyperdense vessel. Skull: Small right parietal scalp hematoma. No underlying fracture. Negative for fracture or focal lesion. Sinuses/Orbits: Mild mucosal thickening within the ethmoid and sphenoid sinuses. Other: None. IMPRESSION: 1. No acute intracranial process. 2. Small right parietal scalp hematoma. Electronically Signed   By: Randa Ngo M.D.   On: 12/26/2020 21:26   CT CERVICAL SPINE WO CONTRAST  Result Date: 12/26/2020 CLINICAL DATA:  Head and neck trauma EXAM: CT CERVICAL SPINE WITHOUT CONTRAST TECHNIQUE: Multidetector CT imaging of the cervical spine was performed without intravenous contrast. Multiplanar CT image reconstructions were also generated. COMPARISON:  None. FINDINGS: Alignment: Alignment is grossly anatomic. Skull base and vertebrae: No acute fracture. No primary bone lesion or focal pathologic process. Soft tissues and spinal canal: No prevertebral fluid or swelling. No visible canal hematoma. 2.3 x 2.0 x 2.5 cm hypodense nodule arises from the lower pole of the left lobe thyroid. Disc levels: Mild diffuse facet hypertrophy. Disc spaces are well preserved. Upper chest: Central airway is patent.  Lung apices are clear. Other: Reconstructed images demonstrate no additional findings. IMPRESSION: 1. No acute cervical spine fracture. 2. Incidental 2.5 cm left lobe thyroid nodule. Recommend nonemergent thyroid US if clinically warranted  given patient age. Electronically Signed   By: Randa Ngo M.D.   On: 12/26/2020 21:31   DG CHEST PORT 1 VIEW  Result Date: 01/07/2021 CLINICAL DATA:  Shortness of breath, weakness and dizziness. EXAM: PORTABLE CHEST 1 VIEW COMPARISON:  01/04/2021. FINDINGS: Previous median sternotomy  and CABG. Chronic cardiomegaly. Pacemaker/AICD remains in place. There is pulmonary venous hypertension with mild interstitial edema. Small pleural effusions with some basilar atelectasis, left more than right. Findings appear slightly worse than the study of 3 days ago. IMPRESSION: Congestive heart failure, mildly worsened since the study of 3 days ago. Electronically Signed   By: Nelson Chimes M.D.   On: 01/07/2021 11:11   DG Chest Port 1 View  Result Date: 01/04/2021 CLINICAL DATA:  S/P PICC central line placement EXAM: PORTABLE CHEST - 1 VIEW COMPARISON:  01/03/2019 FINDINGS: Right arm PICC line to the cavoatrial junction. Stable left subclavian AICD. Coarse airspace opacities at the left lung base, obscuring the left diaphragmatic leaflet. Heart size upper limits normal. Aortic Atherosclerosis (ICD10-170.0). CABG markers. Blunting of left lateral costophrenic angle suggesting small effusion. No pneumothorax. Right shoulder DJD. IMPRESSION: Right arm PICC line to the cavoatrial junction. Coarse left lower lobe airspace opacities. Electronically Signed   By: Lucrezia Europe M.D.   On: 01/04/2021 12:18   DG Chest Port 1 View  Result Date: 01/02/2021 CLINICAL DATA:  Reason for exam: sob Covid + Patient reports sob without any chest pains. Hx of afib, chf, diabetes, htn, prior MI 2005. Hx of defibrillator, cabg x3 2005. EXAM: PORTABLE CHEST - 1 VIEW COMPARISON:  01/01/2021 FINDINGS: Left subclavian AICD stable. Relatively low lung volumes with some crowding of perihilar and bibasilar bronchovascular structures. No confluent airspace disease. Stable borderline cardiomegaly. CABG markers. Aortic Atherosclerosis (ICD10-170.0).  No effusion.  No pneumothorax. Sternotomy wires. Right shoulder DJD. Cholecystectomy clips. vertebral endplate spurring at multiple levels in the lower thoracic spine. IMPRESSION: Low volumes.  Stable chronic and postop changes as above. Electronically Signed   By: Lucrezia Europe M.D.   On: 01/02/2021 07:33   DG Chest Portable 1 View  Result Date: 01/01/2021 CLINICAL DATA:  Shortness of breath EXAM: PORTABLE CHEST 1 VIEW COMPARISON:  12/27/2020 FINDINGS: Left AICD remains in place, unchanged. Prior CABG. Cardiomegaly, vascular congestion. No overt edema. No confluent opacities or effusions. IMPRESSION: Cardiomegaly, vascular congestion. Electronically Signed   By: Rolm Baptise M.D.   On: 01/01/2021 15:46   DG CHEST PORT 1 VIEW  Result Date: 12/27/2020 CLINICAL DATA:  Shortness of breath, COVID EXAM: PORTABLE CHEST 1 VIEW COMPARISON:  12/25/2020 FINDINGS: Cardiomegaly with left chest multi lead pacer defibrillator. Prominent pulmonary vasculature without acute airspace opacity. Possible trace pleural effusions and or pleural thickening. IMPRESSION: Cardiomegaly and pulmonary vascular congestion. No acute airspace opacity. Possible trace pleural effusions and or pleural thickening. Electronically Signed   By: Delanna Ahmadi M.D.   On: 12/27/2020 10:51   DG Chest Portable 1 View  Result Date: 12/25/2020 CLINICAL DATA:  COVID positive. Shortness of breath. EXAM: PORTABLE CHEST 1 VIEW COMPARISON:  January 2020 FINDINGS: Cardiac pacemaker and postsurgical changes are stable. Enlarged cardiac silhouette. Calcific atherosclerotic disease and tortuosity of the aorta. Subtle patchy areas of airspace consolidation in the lung bases. Osseous structures are without acute abnormality. Soft tissues are grossly normal. IMPRESSION: Subtle patchy areas of airspace consolidation in the lung bases may represent early atypical pneumonia. Electronically Signed   By: Fidela Salisbury M.D.   On: 12/25/2020 18:09   VAS [place]  LOWER EXTREMITY VENOUS (DVT)  Result Date: 12/27/2020  Lower Venous DVT Study Patient Name:  [name]  Date of Exam:   12/27/2020 Medical Rec #: [id number]        Accession #:    [id number] Date of Birth: 01/31/1929  Patient Gender: M Patient Age:   9 years Exam Location:  [name] Procedure:      VAS [place] LOWER EXTREMITY VENOUS (DVT) Referring Phys: A POWELL JR --------------------------------------------------------------------------------  Indications: Left > right LE edema.  Risk Factors: COVID-19 +. Comparison Study: No prior study Performing Technologist: Maudry Mayhew MHA, RDMS, RVT, RDCS  Examination Guidelines: A complete evaluation includes B-mode imaging, spectral Doppler, color Doppler, and power Doppler as needed of all accessible portions of each vessel. Bilateral testing is considered an integral part of a complete examination. Limited examinations for reoccurring indications may be performed as noted. The reflux portion of the exam is performed with the patient in reverse Trendelenburg.  +-----+---------------+---------+-----------+----------+--------------+ RIGHTCompressibilityPhasicitySpontaneityPropertiesThrombus Aging +-----+---------------+---------+-----------+----------+--------------+ CFV  Full           Yes      Yes                                 +-----+---------------+---------+-----------+----------+--------------+   +---------+---------------+---------+-----------+----------+--------------+ LEFT     CompressibilityPhasicitySpontaneityPropertiesThrombus Aging +---------+---------------+---------+-----------+----------+--------------+ CFV      Full           Yes      Yes                                 +---------+---------------+---------+-----------+----------+--------------+ SFJ      Full                                                        +---------+---------------+---------+-----------+----------+--------------+ FV  Prox  Full                                                        +---------+---------------+---------+-----------+----------+--------------+ FV Mid   Full                                                        +---------+---------------+---------+-----------+----------+--------------+ FV DistalFull                                                        +---------+---------------+---------+-----------+----------+--------------+ PFV      Full                                                        +---------+---------------+---------+-----------+----------+--------------+ POP      Full           Yes      Yes                                 +---------+---------------+---------+-----------+----------+--------------+  PTV      Full                                                        +---------+---------------+---------+-----------+----------+--------------+ PERO     Full                                                        +---------+---------------+---------+-----------+----------+--------------+ Gastroc  Full                    Yes                                 +---------+---------------+---------+-----------+----------+--------------+    Summary: RIGHT: - No evidence of common femoral vein obstruction.  LEFT: - There is no evidence of deep vein thrombosis in the lower extremity.  - No cystic structure found in the popliteal fossa.  *See table(s) above for measurements and observations. Electronically signed by Servando Snare MD on 12/27/2020 at 1:44:21 PM.    Final    [place] EKG SITE RITE  Result Date: 01/04/2021 If Site Rite image not attached, placement could not be confirmed due to current cardiac rhythm.   Successful DCCV to NSR twice   Subjective: Patient was seen and examined at bedside.  Overnight events noted.   Patient reports feeling much improved.  He wants to be discharged.   Patient is being discharged to skilled nursing facility for  rehab.  Discharge Exam: Vitals:   01/13/21 0410 01/13/21 1004  BP: 118/60 96/64  Pulse: 74   Resp: 18   Temp: 97.7 F (36.5 C)   SpO2: 95%    Vitals:   01/12/21 0835 01/12/21 2025 01/13/21 0410 01/13/21 1004  BP: 119/62 121/60 118/60 96/64  Pulse: 72 74 74   Resp:  20 18   Temp: (!) 97.5 F (36.4 C) 98.5 F (36.9 C) 97.7 F (36.5 C)   TempSrc: Oral Oral Oral   SpO2: 96% 100% 95%   Weight:   75.2 kg   Height:        General: Pt is alert, awake, not in acute distress Cardiovascular: RRR, S1/S2 +, no rubs, no gallops Respiratory: CTA bilaterally, no wheezing, no rhonchi Abdominal: Soft, NT, ND, bowel sounds + Extremities: no edema, no cyanosis    The results of significant diagnostics from this hospitalization (including imaging, microbiology, ancillary and laboratory) are listed below for reference.     Microbiology: Recent Results (from the past 240 hour(s))  SARS CORONAVIRUS 2 (TAT 6-24 HRS) Nasopharyngeal Nasopharyngeal Swab     Status: Abnormal   Collection Time: 01/12/21  1:18 PM   Specimen: Nasopharyngeal Swab  Result Value Ref Range Status   SARS Coronavirus 2 POSITIVE (A) NEGATIVE Final    Comment: (NOTE) SARS-CoV-2 target nucleic acids are DETECTED.  The SARS-CoV-2 RNA is generally detectable in upper and lower respiratory specimens during the acute phase of infection. Positive results are indicative of the presence of SARS-CoV-2 RNA. Clinical correlation with patient history and other diagnostic information is  necessary to determine patient infection status. Positive results do not rule  out bacterial infection or co-infection with other viruses.  The expected result is Negative.  Fact Sheet for Patients: SugarRoll.be  Fact Sheet for Healthcare Providers: https://www.woods-mathews.com/  This test is not yet approved or cleared by the [place] FDA and  has been authorized for detection and/or diagnosis  of SARS-CoV-2 by FDA under an Emergency Use Authorization (EUA). This EUA will remain  in effect (meaning this test can be used) for the duration of the COVID-19 declaration under Section 564(b)(1) of the Act, 21 U. S.C. section 360bbb-3(b)(1), unless the authorization is terminated or revoked sooner.   Performed at [name], [place] [address] [place], [place] [address]      Labs: BNP (last 3 results) Recent Labs    01/04/21 0411 01/05/21 0350 01/06/21 1109  BNP 2,990.9* 1,925.1* 119.4*   Basic Metabolic Panel: Recent Labs  Lab 01/08/21 0500 01/09/21 0330 01/10/21 0450 01/11/21 0602 01/12/21 0330 01/13/21 0029  NA 136 135 134* 135 134* 137  K 3.9 3.6 4.0 3.8 4.0 4.1  CL 102 101 99 100 99 102  CO2 _0 GLUCOSE 81 139* 181* 58* 91 234*  BUN 68* 68* 63* 63* 67* 63*  CREATININE 2.11* 2.14* 2.08* 2.14* 2.51* 2.38*  CALCIUM 8.5* 8.2* 8.5* 8.5* 8.4* 8.5*  MG 2.0  --   --  2.2 1.9  --   PHOS  --   --   --   --  3.4  --    Liver Function Tests: No results for input(s): AST, ALT, ALKPHOS, BILITOT, PROT, ALBUMIN in the last 168 hours. No results for input(s): LIPASE, AMYLASE in the last 168 hours. No results for input(s): AMMONIA in the last 168 hours. CBC: Recent Labs  Lab 01/09/21 1658 01/12/21 0330  WBC 11.7* 11.6*  HGB 9.4* 8.4*  HCT 32.1* 28.1*  MCV 83.2 82.9  PLT 276 247   Cardiac Enzymes: No results for input(s): CKTOTAL, CKMB, CKMBINDEX, TROPONINI in the last 168 hours. BNP: Invalid input(s): POCBNP CBG: Recent Labs  Lab 01/12/21 1105 01/12/21 1650 01/12/21 2125 01/13/21 0540 01/13/21 0720  GLUCAP 139* 114* 378* 58* 121*   D-Dimer No results for input(s): DDIMER in the last 72 hours. Hgb A1c No results for input(s): HGBA1C in the last 72 hours. Lipid Profile No results for input(s): CHOL, HDL, LDLCALC, TRIG, CHOLHDL, LDLDIRECT in the last 72 hours. Thyroid function studies No results for input(s): TSH, T4TOTAL, T3FREE,  THYROIDAB in the last 72 hours.  Invalid input(s): FREET3 Anemia work up No results for input(s): VITAMINB12, FOLATE, FERRITIN, TIBC, IRON, RETICCTPCT in the last 72 hours. Urinalysis    Component Value Date/Time   COLORURINE YELLOW 01/01/2021 2046   APPEARANCEUR CLEAR 01/01/2021 2046   LABSPEC 1.010 01/01/2021 2046   PHURINE 5.0 01/01/2021 2046   GLUCOSEU >=500 (A) 01/01/2021 2046   HGBUR NEGATIVE 01/01/2021 2046   HGBUR negative 07/14/2008 0852   BILIRUBINUR NEGATIVE 01/01/2021 2046   BILIRUBINUR n 08/15/2016 [place] 01/01/2021 2046   PROTEINUR NEGATIVE 01/01/2021 2046   UROBILINOGEN 0.2 08/15/2016 1322   UROBILINOGEN 0.2 04/29/2014 1006   NITRITE NEGATIVE 01/01/2021 2046   LEUKOCYTESUR NEGATIVE 01/01/2021 2046   Sepsis Labs Invalid input(s): PROCALCITONIN,  WBC,  LACTICIDVEN Microbiology Recent Results (from the past 240 hour(s))  SARS CORONAVIRUS 2 (TAT 6-24 HRS) Nasopharyngeal Nasopharyngeal Swab     Status: Abnormal   Collection Time: 01/12/21  1:18 PM   Specimen: Nasopharyngeal Swab  Result  Value Ref Range Status   SARS Coronavirus 2 POSITIVE (A) NEGATIVE Final    Comment: (NOTE) SARS-CoV-2 target nucleic acids are DETECTED.  The SARS-CoV-2 RNA is generally detectable in upper and lower respiratory specimens during the acute phase of infection. Positive results are indicative of the presence of SARS-CoV-2 RNA. Clinical correlation with patient history and other diagnostic information is  necessary to determine patient infection status. Positive results do not rule out bacterial infection or co-infection with other viruses.  The expected result is Negative.  Fact Sheet for Patients: SugarRoll.be  Fact Sheet for Healthcare Providers: https://www.woods-mathews.com/  This test is not yet approved or cleared by the [place] FDA and  has been authorized for detection and/or diagnosis of SARS-CoV-2  by FDA under an Emergency Use Authorization (EUA). This EUA will remain  in effect (meaning this test can be used) for the duration of the COVID-19 declaration under Section 564(b)(1) of the Act, 21 U. S.C. section 360bbb-3(b)(1), unless the authorization is terminated or revoked sooner.   Performed at [name], [place] [address], [place], [place] [address]      Time coordinating discharge: Over 30 minutes  SIGNED:   Shawna Clamp, MD  [facility] 01/13/2021, 12:01 PM Pager   If 7PM-7AM, please contact night-coverage

## 2021-01-13 NOTE — Discharge Instructions (Signed)
Advised to follow-up with primary care physician in 1 week. Advised to follow-up with cardiology/CHF team as scheduled. Hydralazine and Coreg was discontinued during hospitalization. Other blood pressure and heart medications adjusted. Cardiac Meds for Discharge Apixaban 2.5 mg bid Atorvastatin 40 mg daily  Amiodarone 200 mg bid x 7 days>>200 mg daily  Farxiga 10 mg daily  Digoxin 0.0625 MWF Imdur 15 mg daily  Spironolactone 25 mg daily  Torsemide 40 mg daily    Post hospital f/u arranged in the [facility]. Appt info in AVS

## 2021-01-13 NOTE — TOC Transition Note (Signed)
Transition of Care [facility]) - CM/SW Discharge Note   Patient Details  Name: [name] MRN: [id number] Date of Birth: 1928/09/26  [facility] CM/SW Contact:  Sorrel, Ivins Phone Number: 01/13/2021, 11:52 AM   Clinical Narrative:    Patient will DC to: [place], SNF Anticipated DC date: 01/13/21 Family notified: DTR, [name] Transport by: Corey Harold   Per MD patient ready for DC to [place], [place]. RN to call report prior to discharge [phone] Room# 210). RN, patient, patient's family, and facility notified of DC. Discharge Summary and FL2 sent to facility. DC packet on chart. Ambulance transport requested for patient eta roughly 2 or 3pm.  CSW will sign off for now as social work intervention is no longer needed. Please consult [place] again if new needs arise.     Final next level of care: Skilled Nursing Facility Barriers to Discharge: No Barriers Identified   Patient Goals and CMS Choice Patient states their goals for this hospitalization and ongoing recovery are:: my legs are weak and I believe I will need rehab CMS Medicare.gov Compare Post Acute Care list provided to:: Patient Choice offered to / list presented to : Patient, Adult Children  Discharge Placement PASRR number recieved: 01/13/21            Patient [facility] bed at: [facility] Patient to be transferred to facility by: [facility] Name of family member notified: DTR, [name] [phone] Patient and family notified of of transfer: 01/13/21  Discharge Plan and Services In-house Referral: Clinical Social Work Discharge Planning Services: CM Consult Post Acute Care Choice: [place]                               Social Determinants of Health (SDOH) Interventions     Readmission Risk Interventions No flowsheet data found.   [name], MSW, [place] Heart Failure Social Worker

## 2021-01-13 NOTE — Progress Notes (Addendum)
Patient ID: [name], male   DOB: 1929-01-12, 87 y.o.   MRN: [id number]     Advanced Heart Failure Rounding Note  PCP-Cardiologist: None   Subjective:    10/26 DCCV to NSR.   He is back in Afib today but rate controlled in 80s and asymptomatic.   Torsemide held yesterday w/ bump in SCr.   SCr back down today, 2.51>>2.38. Wt down 4 lb. No longer w/ PICC line.   He feels well today. Denies dyspnea. No CP. BP well controlled. Asking if he can be discharged today.     Objective:   Weight Range: 75.2 kg Body mass index is 21.29 kg/m.   Vital Signs:   Temp:  [97.5 F (36.4 C)-98.5 F (36.9 C)] 97.7 F (36.5 C) (10/28 0410) Pulse Rate:  [72-74] 74 (10/28 0410) Resp:  [18-20] 18 (10/28 0410) BP: (118-121)/(60-62) 118/60 (10/28 0410) SpO2:  [95 %-100 %] 95 % (10/28 0410) Weight:  [75.2 kg] 75.2 kg (10/28 0410) Last BM Date: 01/11/21  Weight change: Filed Weights   01/11/21 0010 01/12/21 0042 01/13/21 0410  Weight: 76.5 kg 77 kg 75.2 kg    Intake/Output:   Intake/Output Summary (Last 24 hours) at 01/13/2021 0815 Last data filed at 01/13/2021 0600 Gross per 24 hour  Intake 558 ml  Output 1200 ml  Net -642 ml      Physical Exam   General:  Well appearing elderly WM. No respiratory difficulty HEENT: normal Neck: supple. JVD 8 cm. Carotids 2+ bilat; no bruits. No lymphadenopathy or thyromegaly appreciated. Cor: PMI nondisplaced. Irregular rhythm. No rubs, gallops or murmurs. Lungs: clear Abdomen: soft, nontender, nondistended. No hepatosplenomegaly. No bruits or masses. Good bowel sounds. Extremities: no cyanosis, clubbing, rash, edema Neuro: alert & oriented x 3, cranial nerves grossly intact. moves all 4 extremities w/o difficulty. Affect pleasant.  Telemetry   Afib 80s Personally reviewed   Labs    CBC Recent Labs    01/12/21 0330  WBC 11.6*  HGB 8.4*  HCT 28.1*  MCV 82.9  PLT 073   Basic Metabolic Panel Recent Labs    01/11/21 0602  01/12/21 0330 01/13/21 0029  NA 135 134* 137  K 3.8 4.0 4.1  CL 100 99 102  CO2 28 27 28   GLUCOSE 58* 91 234*  BUN 63* 67* 63*  CREATININE 2.14* 2.51* 2.38*  CALCIUM 8.5* 8.4* 8.5*  MG 2.2 1.9  --   PHOS  --  3.4  --    Liver Function Tests No results for input(s): AST, ALT, ALKPHOS, BILITOT, PROT, ALBUMIN in the last 72 hours. No results for input(s): LIPASE, AMYLASE in the last 72 hours. Cardiac Enzymes No results for input(s): CKTOTAL, CKMB, CKMBINDEX, TROPONINI in the last 72 hours.  BNP: BNP (last 3 results) Recent Labs    01/04/21 0411 01/05/21 0350 01/06/21 1109  BNP 2,990.9* 1,925.1* 993.7*    ProBNP (last 3 results) No results for input(s): PROBNP in the last 8760 hours.   D-Dimer No results for input(s): DDIMER in the last 72 hours. Hemoglobin A1C No results for input(s): HGBA1C in the last 72 hours. Fasting Lipid Panel No results for input(s): CHOL, HDL, LDLCALC, TRIG, CHOLHDL, LDLDIRECT in the last 72 hours. Thyroid Function Tests No results for input(s): TSH, T4TOTAL, T3FREE, THYROIDAB in the last 72 hours.  Invalid input(s): FREET3  Other results:   Imaging    No results found.   Medications:     Scheduled Medications:  allopurinol  100 mg  Oral Daily   amiodarone  200 mg Oral BID   apixaban  2.5 mg Oral BID   atorvastatin  40 mg Oral Daily   Chlorhexidine Gluconate Cloth  6 each Topical Daily   dapagliflozin propanediol  10 mg Oral QAC breakfast   digoxin  0.0625 mg Oral Q M,W,F   escitalopram  10 mg Oral Daily   gabapentin  100 mg Oral BID   guaiFENesin  600 mg Oral BID   insulin aspart  0-15 Units Subcutaneous TID WC   insulin aspart  0-5 Units Subcutaneous QHS   insulin glargine-yfgn  25 Units Subcutaneous QHS   isosorbide mononitrate  15 mg Oral Daily   loratadine  10 mg Oral Daily   pramipexole  0.25 mg Oral Q24H   sodium chloride flush  10-40 mL Intracatheter Q12H   spironolactone  25 mg Oral Daily   tamsulosin  0.4 mg  Oral QPC breakfast   torsemide  40 mg Oral Daily   vitamin B-12  1,000 mcg Oral Daily    Infusions:     PRN Medications: acetaminophen **OR** acetaminophen, guaiFENesin-dextromethorphan, lidocaine    Assessment/Plan   1. Acute on chronic systolic CHF: Ischemic cardiomyopathy.  Echo in 5/22 with EF 20-25%, low gradient moderate AS with mean gradient 12 mmHg and AVA 1.1. [employer] CRT-D device.  CHF exacerbation in setting of recurrent atrial fibrillation and loss of effective CRT.  Creatinine stable around 2.3.  Milrinone stopped 10/22, co-ox has been stable, 62% yesterday.   - Continue torsemide 40 mg daily (on 20 mg daily at home).  - Continue digoxin 0.0625 mg qMWF - Continue Farxiga 10 mg daily.  - Continue spironolactone 25 daily.  - Continue Imdur 15 mg daily  - Hold Coreg and hydral for now 2. Atrial fibrillation: Paroxysmal.  Went into AF on 10/9, likely triggered by COVID-19 infection.  Loss of effective CRT with AF.  Back in NSR after DC-CV on 10/21. Back in Afib 10/25. DCCV again 10/26.  If back in Afib today. Rate controlled.  - Continue PO amio today, 200 mg bid x 7 days>>200 mg daily  - On Eliquis 2.5 bid.  3. AKI on CKD stage 4: Baseline creatinine around 2.1.  Creatinine 2.85 => 2.71=> 2.27 => 2.11 => 2.14=>2.08 => 2.14 =>2.58-=>2.38 today.  4. COVID-19: Admitted initially 10/8 with COVID-19. Received monoclonal ab and remdesivir initially. Still with cough though CHF likely contributes to this.  - Now off precautions.  5. CAD: s/p CABG.  No chest pain.  HS-TnI minimally elevated with no trend, likely demand ischemia. No chest pain.  - Continue atorvastatin. - Not on ASA with stable CAD and apixaban use.  6. Skin tear on RUE  -  Wound Care consulted. Bedside RN to manage 7. Deconditioning - Work w/ PT - Needs SNF. SW assisting w/ placement   Ok to send to SNF today.   Cardiac Meds for Discharge Apixaban 2.5 mg bid Atorvastatin 40 mg daily  Amiodarone 200 mg  bid x 7 days>>200 mg daily  Farxiga 10 mg daily  Digoxin 0.0625 MWF Imdur 15 mg daily  Spironolactone 25 mg daily  Torsemide 40 mg daily    Post hospital f/u arranged in the [facility]. Appt info in AVS     Length of Stay: [address] PA-C  01/13/2021, 8:15 AM  Advanced Heart Failure Team Pager [phone] (M-F; 7a - 5p)  Please contact [facility] for night-coverage after hours (5p -7a ) and weekends  on [email or web address]  Agree with the above note.   He is back in atrial fibrillation with controlled rate today after DCCV x 2.  Would keep him on amiodarone for now, see if he converts back to NSR on his own.  Unfortunately, he may end up being in chronic AF but will reassess in clinic to decide on 1 more attempt to cardiovert (low percentage of BiV pacing in AF).   Can restart torsemide today, creatinine lower at 2.38.   Home on the above meds, will need close followup in CHF clinic.   [name] 01/13/2021 12:47 PM

## 2021-01-16 ENCOUNTER — Other Ambulatory Visit (HOSPITAL_COMMUNITY): Payer: Self-pay | Admitting: Cardiology

## 2021-01-16 DIAGNOSIS — J9601 Acute respiratory failure with hypoxia: Secondary | ICD-10-CM | POA: Diagnosis not present

## 2021-01-16 DIAGNOSIS — G4733 Obstructive sleep apnea (adult) (pediatric): Secondary | ICD-10-CM | POA: Diagnosis not present

## 2021-01-16 DIAGNOSIS — E1159 Type 2 diabetes mellitus with other circulatory complications: Secondary | ICD-10-CM | POA: Diagnosis not present

## 2021-01-16 DIAGNOSIS — I4891 Unspecified atrial fibrillation: Secondary | ICD-10-CM | POA: Diagnosis not present

## 2021-01-16 DIAGNOSIS — Z794 Long term (current) use of insulin: Secondary | ICD-10-CM | POA: Diagnosis not present

## 2021-01-16 DIAGNOSIS — J209 Acute bronchitis, unspecified: Secondary | ICD-10-CM | POA: Diagnosis not present

## 2021-01-16 DIAGNOSIS — N179 Acute kidney failure, unspecified: Secondary | ICD-10-CM | POA: Diagnosis not present

## 2021-01-16 DIAGNOSIS — I152 Hypertension secondary to endocrine disorders: Secondary | ICD-10-CM | POA: Diagnosis not present

## 2021-01-16 DIAGNOSIS — N1832 Chronic kidney disease, stage 3b: Secondary | ICD-10-CM | POA: Diagnosis not present

## 2021-01-16 DIAGNOSIS — I429 Cardiomyopathy, unspecified: Secondary | ICD-10-CM | POA: Diagnosis not present

## 2021-01-16 DIAGNOSIS — U071 COVID-19: Secondary | ICD-10-CM | POA: Diagnosis not present

## 2021-01-16 DIAGNOSIS — I5023 Acute on chronic systolic (congestive) heart failure: Secondary | ICD-10-CM | POA: Diagnosis not present

## 2021-01-17 NOTE — Telephone Encounter (Signed)
Error

## 2021-01-19 ENCOUNTER — Ambulatory Visit: Payer: Medicare Other | Admitting: Internal Medicine

## 2021-01-25 ENCOUNTER — Inpatient Hospital Stay: Payer: No Typology Code available for payment source | Admitting: Internal Medicine

## 2021-01-25 NOTE — Progress Notes (Signed)
Date:  01/27/2021   ID:  [name], DOB 1928/10/28, MRN [id number]  Provider location: [facility] [facility] Type of Visit: Established patient   PCP:  Burnis Medin, MD  Cardiologist:  Dr. [name]   History of Present Illness: [name] is a 87 y.o. male who has history of CAD s/p CABG, ischemic cardiomyopathy, and paroxysmal atrial fibrillation.  Patient was admitted in 2/16 with CHF and diuresed, he was sent home on Lasix 60 mg bid.  He missed 3 days of the pm Lasix dose and was re-admitted on 05/18/14 with acute on chronic systolic CHF with dyspnea and hypoxemia. He was diuresed with Lasix gtt and metolazone.  While in the hospital, he went into atrial fibrillation with RVR requiring cardioversion.  Creatinine was elevated and ramipril was stopped.  We had to cut back on Coreg due to hypotension and low output.  He was begun on low dose digoxin.    Echo in 7/20 showed EF 20-25% with LAD territory akinesis, mildly decreased RV systolic function, moderate AS with mild AI.  Echo was done today and reviewed, EF 20-25%, apical/peri-apical akinesis, mildly decreased RV systolic function, mild MR, mild AI, low gradient moderate AS with mean gradient 12 mmHg and AVA 1.1 cm^2, IVC normal.   Echo 5/22 showed EF 20-25%, severe LV dysfunction, moderately dilated LV and moderate LVH. Grade I DD, RV mildly reduced, mild MR, mild AI, low gradient moderate aortic valve stenosis with  mean gradient of 12.0 mmHg.  Admitted 12/24/20 with COVID + A/C combined systolic/diastolic heart failure. COVID treated with MAB (bebtelovimab). Received 2 doses of remdesivir. Diuresed with IV lasix and transitioned to torsemide. EKG 12/26/20 A fib. Discharged on 12/27/20.   Readmitted 01/01/21 with A/C CHF and Afib with RVR. Started on IV lasix and milrinone. Underwent DCCV to normal sinus rhythm on 10/21, but went back to A. fib with RVR and IV amiodarone started. Underwent repeat DCCV to normal  sinus rhythm on 01/11/21.  Unfortunately, he was back in atrial fibrillation a couple days later, however rate-controlled. IV lasix transitioned to po torsemide and milrinone and amiodarone weaned off. Coreg and hydralazine held with soft BP. He was discharged to [facility] skilled nursing facility for rehab on po amiodarone taper.    Today he returns for post hospitalization HF follow up with his daughter. He is at [facility] for rehab. Working with PT but has a wound on the bottom of his left foot that has limited his progress. This is being addressed by wound care at facility. Main complaint today is fatigue in his arms. He is not short of breath when transferring out of his wheelchair. Denies CP, dizziness, edema, or PND/Orthopnea. Appetite fair. No fever or chills. Weight at facility 163 lbs yesterday. Taking all medications provided by facility.   St Jude device interrogation: + AF, no VT, > 96% BiV paced, stable thoracic impedance (personally reviewed).  ECG (personally reviewed): atrial fibrillation RBBB   Labs (3/16): K 4.2, creatinine 2.26, digoxin < 0.2 Labs (05/31/2014): TSH 2.3 Dig level 0.6  Labs (06/14/2014): K 4.5 Creatinine 2.14, LDL 28, HDL 46 Labs (6/16): K 4.5, creatinine 2.08, BNP 712 Labs (7/16): K 4.1, creatinine 2.18, HCT 36.8, LFTs normal, digoxin 0.9 Labs (10/16): LDL 26, HDL 38, TSH normal, LFTs normal Labs (12/16): K 4.6, creatinine 2 Labs (2/17): LFTs normal, digoxin 1.1 Labs (4/17): K 4.7, creatinine 1.97, BUN 50, hgb 12.8, TSH normal, digoxin 0.8,  LDL 29 Labs (7/17): digoxin 0.6 Labs (11/17): K 4.4, creatinine 2.13 Labs (12/17): hgb 11.4 Labs (5/18): LFTs normal, K 4.6, creatinne 2.21, hgb 10.9 Labs (7/19): Cr 1.97, K 4.4, Hgb 13.2, Hgb A1c 7.1. LFTs normal.  LDL 25 Labs (9/19): K 4.2, creatinine 1.96 Labs (12/19): TSH normal, digoxin 0.7, LFTs normal, LDL 24 Labs (1/20): hgb 13.4 Labs (4/20): LFTs normal, digoxin 0.7, TSH normal Labs (5/20): K 4.1, creatinine  1.91 Labs (11/21): K 4.2, creatinine 1.92, LDL 29, HDL 39, TSH normal, LFTs normal Labs (5/22): K 4.1, creatinine 2.18, TSH normal, LFTs normal, hgb 12.1 Labs (10/22): K 4.0, creatinine 2.51  PMH: 1. CAD: S/p CABG 2005 with LIMA-Diagonal, SVG-LAD, SVG-LCx, SVG-RCA.  Cardiolite in 2/15 with EF 19%, no ischemia.  2. Ischemic cardiomyopathy: Echo (2/16) with EF 20-25%, severe LV dilation with RWMAs, moderate AI, moderate MR, moderately decreased RV systolic function.  St Jude CRT-D device.  - Echo 10/2016 LVEF 20-25% Grade 1 DD, Mild/Mod MR, mild LAE, Mildly reduced RV function, Mild RAE, Peak PA pressure 32 mmHg - Echo (7/20): EF 20-25%, LAD territory AK, normal RV size with mildly decreased systolic function, mild AI with moderate AS (mean gradient 24 mmHg).  - Echo (5/22): EF 20-25%, apical/peri-apical akinesis, mildly decreased RV systolic function, mild MR, mild AI, low gradient moderate AS with mean gradient 12 mmHg and AVA 1.1 cm^2, IVC normal.  3. Atrial fibrillation: Paroxysmal.  DCCV 3/16.  4. CKD stage 3 5. Carotid stenosis: Carotid dopplers (1/16) with 60-79% RICA stenosis.  Carotids (1/17) with bilateral 40-59% ICA stenosis.  - Carotids (1/18): 40-59% BICA stenosis.  - Carotids (5/20): 40-59% RICA stenosis.  - Carotids (6/21): 40-59% RICA stenosis.  6. HTN 7. Hyperlipidemia 8. Restless leg syndrome. 9. Type 2 diabetes. 10. OA 11. Depression 12. H/o CCY 93. H/o appy 14. OSA: Uses CPAP.  15. ABIs (7/17): Normal 16. Melanoma: s/p excision. 17. Aortic stenosis: Moderate on 7/20 echo and on 5/22 echo.   18. Gout  Current Outpatient Medications  Medication Sig Dispense Refill   acetaminophen (TYLENOL) 650 MG CR tablet Take 650 mg by mouth 3 (three) times daily.     allopurinol (ZYLOPRIM) 100 MG tablet TAKE 1 TABLET DAILY 30 tablet 11   amiodarone (PACERONE) 200 MG tablet Take 1 tablet (200 mg total) by mouth daily. 90 tablet 3   apixaban (ELIQUIS) 2.5 MG TABS tablet Take 1  tablet (2.5 mg total) by mouth 2 (two) times daily. 270 tablet 3   atorvastatin (LIPITOR) 40 MG tablet TAKE 1 TABLET DAILY 90 tablet 3   Blood Glucose Monitoring Suppl (ACCU-CHEK AVIVA PLUS) w/Device KIT Check blood sugars daily up to three times daily or as needed. (Patient taking differently: Check blood sugar twice daily) 1 kit prn   Chlorpheniramine-DM (CORICIDIN HBP COUGH/COLD PO) Take 30 mLs by mouth every 4 (four) hours as needed (cough).     dapagliflozin propanediol (FARXIGA) 10 MG TABS tablet Take 1 tablet (10 mg total) by mouth daily before breakfast. 30 tablet 1   digoxin 62.5 MCG TABS Take 0.0625 mg by mouth every Monday, Wednesday, and Friday. 30 tablet 1   escitalopram (LEXAPRO) 10 MG tablet Take 1 tablet (10 mg total) by mouth daily. Please schedule follow up with Dr. Regis Bill for refills. [phone] Thank you! 90 tablet 1   gabapentin (NEURONTIN) 100 MG capsule Take 100 mg by mouth 2 (two) times daily.     glucose blood (ACCU-CHEK AVIVA PLUS) test strip Check blood sugars up  to three times daily as needed. Please schedule your follow up with labs with Dr. Regis Bill. [phone] (Patient taking differently: Check blood sugar bid) 300 strip 0   insulin glargine (LANTUS) 100 unit/mL SOPN Inject 30 Units into the skin at bedtime.      Insulin Pen Needle (BD PEN NEEDLE NANO U/F) 32G X 4 MM MISC [place] TO TEST BLOOD SUGAR THREE TIMES DAILY (Patient taking differently: Check blood sugar twice daily) 300 each 1   isosorbide mononitrate (IMDUR) 30 MG 24 hr tablet Take 0.5 tablets (15 mg total) by mouth daily. 30 tablet 1   lidocaine (LIDODERM) 5 % Place 1 patch onto the skin daily. Remove & Discard patch within 12 hours or as directed by MD (Patient taking differently: Place 1 patch onto the skin daily as needed (shoulder pain). Remove & Discard patch within 12 hours or as directed by MD) 30 patch 3   ONETOUCH DELICA LANCETS FINE MISC Use to test blood sugar 2-3 times daily 300 each 1   OVER THE  COUNTER MEDICATION Place 1 spray into both nostrils daily as needed (runny nose). OTC nasal spray - unknown name     pramipexole (MIRAPEX) 0.5 MG tablet TAKE 1 TABLET 2 TO 3 HOURS BEFORE SLEEP FOR RESTLESS LEG (Patient taking differently: Take 0.5 mg by mouth See admin instructions. Take one tablet (0.5 mg) by mouth 2-3 hours before bedtime - for restless legs) 90 tablet 1   silodosin (RAPAFLO) 8 MG CAPS capsule TAKE 1 CAPSULE DAILY WITH BREAKFAST (Patient taking differently: Take 8 mg by mouth daily before breakfast.) 90 capsule 1   spironolactone (ALDACTONE) 25 MG tablet TAKE 1 TABLET DAILY (Patient taking differently: Take 25 mg by mouth daily with lunch.) 90 tablet 3   torsemide 40 MG TABS Take 40 mg by mouth daily. 30 tablet 1   vitamin B-12 (CYANOCOBALAMIN) 1000 MCG tablet Take 1 tablet (1,000 mcg total) by mouth daily.     No current facility-administered medications for this encounter.    Allergies:   Patient has no known allergies.   Social History:  The patient  reports that he quit smoking about 42 years ago. His smoking use included cigarettes. He has a 40.00 pack-year smoking history. He has quit using smokeless tobacco.  His smokeless tobacco use included chew. He reports that he does not drink alcohol and does not use drugs.   Family History:  The patient's family history includes COPD in his father; Healthy in his daughter, daughter, and daughter.   ROS:  Please see the history of present illness.   All other systems are personally reviewed and negative.   Exam:   BP 130/68   Pulse 97   Wt 73.9 kg (163 lb) Comment: patient was weighed 2 days ago at the rehab facility.  SpO2 96%   BMI 20.93 kg/m   General:  NAD. No resp difficulty, arrived in [facility], elderly HEENT: +HOH, bilateral hearing aids Neck: Supple. No JVD. Carotids 2+ bilat; no bruits. No lymphadenopathy or thryomegaly appreciated. Cor: PMI nondisplaced. Irregular rate & rhythm. No rubs, gallops or murmurs. Lungs:  Diminished throughout all lung fields Abdomen: Soft, nontender, nondistended. No hepatosplenomegaly. No bruits or masses. Good bowel sounds. Extremities: No cyanosis, clubbing, rash, 1+ BLE edema; kerlex gauze to left foot Neuro: Alert & oriented x 3, cranial nerves grossly intact. Moves all 4 extremities w/o difficulty. Affect pleasant.  Recent Labs: 07/19/2020: TSH 1.28 01/06/2021: ALT 16; B Natriuretic Peptide 993.7 01/12/2021: Hemoglobin 8.4; Magnesium  1.9; Platelets 247 01/13/2021: BUN 63; Creatinine, Ser 2.38; Potassium 4.1; Sodium 137  Personally reviewed   Wt Readings from Last 3 Encounters:  01/27/21 73.9 kg (163 lb)  01/13/21 75.2 kg (165 lb 12.6 oz)  12/27/20 76.9 kg (169 lb 8.5 oz)    ASSESSMENT AND PLAN: 1. Chronic systolic CHF: Ischemic cardiomyopathy.  Echo in 5/22 with EF 20-25%, low gradient moderate AS with mean gradient 12 mmHg and AVA 1.1. St Jude CRT-D device.  Recent CHF exacerbation in setting of recurrent atrial fibrillation and loss of effective CRT.  Stable NYHA III, confounded by frailty and general physical deconditioning. He does not appear volume overloaded on exam, LEE looks like venous stasis. - Place UNNA boots. If facility unable to accomodate this, will need compression hose.  Elevate legs. - Restart carvedilol 6.25 mg bid. - Continue torsemide 40 mg daily. BMET today. - Continue digoxin 0.0625 mg qMWF. Dig level today. - Continue Farxiga 10 mg daily.  - Continue spironolactone 25 daily.  - Continue Imdur 15 mg daily  - Hold hydral for now. 2. Atrial fibrillation: Paroxysmal.  Went into AF on 10/9, likely triggered by COVID-19 infection.  Loss of effective CRT with AF.  DC-CV on 10/21, 10/26. Afib today on ECG. Rate controlled. Will defer to Dr. [name] on another attempt at [facility].  - Continue amiodarone 200 mg daily for now. Consider stopping if unable to convert and rate controlled. - Add carvedilol as above.  - On Eliquis 2.5 mg bid. No abnormal   bleeding. - CBC today. 3. CKD stage 4: BMET today. 4. COVID-19: Admitted initially 10/8 with COVID-19. Received monoclonal [name] and remdesivir initially. Breathing and cough  improved. 5. CAD: s/p CABG.  No chest pain.  ECG non-ischemic today. - Continue atorvastatin. - Not on ASA with stable CAD and apixaban use.  6. Deconditioning: Continue PT at facility. 7. Foot wound: he is getting this dressed at his facility. May need formal wound consult if not improving.   Keep follow up with Dr. [name] as scheduled to evaluate candidacy for another DCCV attempt.   Allena Katz, FNP-BC 01/27/21 7:58 PM

## 2021-01-26 DIAGNOSIS — E118 Type 2 diabetes mellitus with unspecified complications: Secondary | ICD-10-CM | POA: Diagnosis not present

## 2021-01-26 DIAGNOSIS — D692 Other nonthrombocytopenic purpura: Secondary | ICD-10-CM | POA: Diagnosis not present

## 2021-01-26 DIAGNOSIS — L89890 Pressure ulcer of other site, unstageable: Secondary | ICD-10-CM | POA: Diagnosis not present

## 2021-01-26 DIAGNOSIS — D649 Anemia, unspecified: Secondary | ICD-10-CM | POA: Diagnosis not present

## 2021-01-27 ENCOUNTER — Encounter (HOSPITAL_COMMUNITY): Payer: Self-pay

## 2021-01-27 ENCOUNTER — Ambulatory Visit (HOSPITAL_COMMUNITY)
Admit: 2021-01-27 | Discharge: 2021-01-27 | Disposition: A | Discharge status: Home / Self Care | Payer: Medicare Other | Attending: Family Medicine | Admitting: Family Medicine

## 2021-01-27 ENCOUNTER — Other Ambulatory Visit: Payer: Self-pay

## 2021-01-27 VITALS — BP 130/68 | HR 97 | Wt 170.1 lb

## 2021-01-27 DIAGNOSIS — Z79899 Other long term (current) drug therapy: Secondary | ICD-10-CM | POA: Insufficient documentation

## 2021-01-27 DIAGNOSIS — I5022 Chronic systolic (congestive) heart failure: Secondary | ICD-10-CM | POA: Diagnosis not present

## 2021-01-27 DIAGNOSIS — N184 Chronic kidney disease, stage 4 (severe): Secondary | ICD-10-CM | POA: Diagnosis not present

## 2021-01-27 DIAGNOSIS — R54 Age-related physical debility: Secondary | ICD-10-CM | POA: Diagnosis not present

## 2021-01-27 DIAGNOSIS — Z7984 Long term (current) use of oral hypoglycemic drugs: Secondary | ICD-10-CM | POA: Diagnosis not present

## 2021-01-27 DIAGNOSIS — E1122 Type 2 diabetes mellitus with diabetic chronic kidney disease: Secondary | ICD-10-CM | POA: Insufficient documentation

## 2021-01-27 DIAGNOSIS — E1159 Type 2 diabetes mellitus with other circulatory complications: Secondary | ICD-10-CM | POA: Diagnosis not present

## 2021-01-27 DIAGNOSIS — I130 Hypertensive heart and chronic kidney disease with heart failure and stage 1 through stage 4 chronic kidney disease, or unspecified chronic kidney disease: Secondary | ICD-10-CM | POA: Diagnosis not present

## 2021-01-27 DIAGNOSIS — R5381 Other malaise: Secondary | ICD-10-CM | POA: Diagnosis not present

## 2021-01-27 DIAGNOSIS — Z7901 Long term (current) use of anticoagulants: Secondary | ICD-10-CM | POA: Diagnosis not present

## 2021-01-27 DIAGNOSIS — Z951 Presence of aortocoronary bypass graft: Secondary | ICD-10-CM | POA: Diagnosis not present

## 2021-01-27 DIAGNOSIS — Z8616 Personal history of COVID-19: Secondary | ICD-10-CM | POA: Insufficient documentation

## 2021-01-27 DIAGNOSIS — I255 Ischemic cardiomyopathy: Secondary | ICD-10-CM | POA: Diagnosis not present

## 2021-01-27 DIAGNOSIS — F324 Major depressive disorder, single episode, in partial remission: Secondary | ICD-10-CM | POA: Diagnosis not present

## 2021-01-27 DIAGNOSIS — S91302A Unspecified open wound, left foot, initial encounter: Secondary | ICD-10-CM | POA: Diagnosis not present

## 2021-01-27 DIAGNOSIS — X58XXXA Exposure to other specified factors, initial encounter: Secondary | ICD-10-CM | POA: Diagnosis not present

## 2021-01-27 DIAGNOSIS — I2510 Atherosclerotic heart disease of native coronary artery without angina pectoris: Secondary | ICD-10-CM

## 2021-01-27 DIAGNOSIS — I480 Paroxysmal atrial fibrillation: Secondary | ICD-10-CM

## 2021-01-27 DIAGNOSIS — I4510 Unspecified right bundle-branch block: Secondary | ICD-10-CM | POA: Insufficient documentation

## 2021-01-27 DIAGNOSIS — Z95810 Presence of automatic (implantable) cardiac defibrillator: Secondary | ICD-10-CM | POA: Diagnosis not present

## 2021-01-27 DIAGNOSIS — L89890 Pressure ulcer of other site, unstageable: Secondary | ICD-10-CM | POA: Diagnosis not present

## 2021-01-27 LAB — CBC
HCT: 33.8 % — ABNORMAL LOW (ref 39.0–52.0)
Hemoglobin: 9.9 g/dL — ABNORMAL LOW (ref 13.0–17.0)
MCH: 24.6 pg — ABNORMAL LOW (ref 26.0–34.0)
MCHC: 29.3 g/dL — ABNORMAL LOW (ref 30.0–36.0)
MCV: 84.1 fL (ref 80.0–100.0)
Platelets: 337 10*3/uL (ref 150–400)
RBC: 4.02 MIL/uL — ABNORMAL LOW (ref 4.22–5.81)
RDW: 18.2 % — ABNORMAL HIGH (ref 11.5–15.5)
WBC: 12.9 10*3/uL — ABNORMAL HIGH (ref 4.0–10.5)
nRBC: 0 % (ref 0.0–0.2)

## 2021-01-27 LAB — BASIC METABOLIC PANEL
Anion gap: 12 (ref 5–15)
BUN: 73 mg/dL — ABNORMAL HIGH (ref 8–23)
CO2: 24 mmol/L (ref 22–32)
Calcium: 8.8 mg/dL — ABNORMAL LOW (ref 8.9–10.3)
Chloride: 103 mmol/L (ref 98–111)
Creatinine, Ser: 2.69 mg/dL — ABNORMAL HIGH (ref 0.61–1.24)
GFR, Estimated: 22 mL/min — ABNORMAL LOW (ref >60)
Glucose, Bld: 138 mg/dL — ABNORMAL HIGH (ref 70–99)
Potassium: 4.2 mmol/L (ref 3.5–5.1)
Sodium: 139 mmol/L (ref 135–145)

## 2021-01-27 LAB — DIGOXIN LEVEL: Digoxin Level: 0.5 ng/mL — ABNORMAL LOW (ref 0.8–2.0)

## 2021-01-27 MED ORDER — CARVEDILOL 6.25 MG PO TABS: 6.2500 mg | ORAL_TABLET | Freq: Two times a day (BID) | ORAL | 11 refills | Status: AC

## 2021-01-27 NOTE — Patient Instructions (Signed)
START Coreg 6.25 mg, one tab twice  a day  Labs today We will only contact you if something comes back abnormal or we need to make some changes. Otherwise no news is good news!  Please apply unna boots, if unna boots are unavailable please elevate legs and place compression hose daily  Keep follow up as scheduled with Dr [name]  Do the following things EVERYDAY: Weigh yourself in the morning before breakfast. Write it down and keep it in a log. Take your medicines as prescribed Eat low salt foods--Limit salt (sodium) to 2000 mg per day.  Stay as active as you can everyday Limit all fluids for the day to less than 2 liters   At the [facility], you and your health needs are our priority. As part of our continuing mission to provide you with exceptional heart care, we have created designated Provider Care Teams. These Care Teams include your primary Cardiologist (physician) and Advanced Practice Providers (APPs- Physician Assistants and Nurse Practitioners) who all work together to provide you with the care you need, when you need it.   You may see any of the following providers on your designated Care Team at your next follow up: Dr Glori Bickers Dr Haynes Kerns, NP Lyda Jester, [place] [facility] [place], [place] Audry Riles, PharmD   Please be sure to bring in all your medications bottles to every appointment.

## 2021-01-30 ENCOUNTER — Telehealth (HOSPITAL_COMMUNITY): Payer: Self-pay

## 2021-01-30 MED ORDER — TORSEMIDE 40 MG PO TABS: 20.0000 mg | ORAL_TABLET | Freq: Every day | ORAL | 2 refills | Status: DC

## 2021-01-30 NOTE — Telephone Encounter (Signed)
Patients wife and patients nurse [name] advised and verbalized understanding. Med list updated to reflect changes.  Meds ordered this encounter  Medications   Torsemide 40 MG TABS    Sig: Take 20 mg by mouth daily.    Dispense:  15 tablet    Refill:  2    Please cancel all previous orders for current medication. Change in dosage or pill size.

## 2021-01-30 NOTE — Telephone Encounter (Signed)
-----   Message from Rafael Bihari, [place] sent at 01/27/2021  6:16 PM EST ----- BUN and SCr remain elevated. Please hold Farxiga & spiro x 1 day, then resume. Please reduce torsemide to 20 mg daily.   WBC remains mildly elevated. Will repeat BMET and CBC at follow up next week.

## 2021-02-01 ENCOUNTER — Telehealth: Payer: Self-pay

## 2021-02-01 NOTE — Telephone Encounter (Signed)
[name] called to check on the status of the MyChart message sent earlier and would like a call back.

## 2021-02-01 NOTE — Telephone Encounter (Signed)
I agree the foot ulcer needs attention Advise family to asked for consult wound consult , orthopedics or podiatry regarding the large ulcer.  Spoke with spouse Ms. Jill Poling today about this.

## 2021-02-02 NOTE — Telephone Encounter (Signed)
Noted  

## 2021-02-02 NOTE — Telephone Encounter (Signed)
I talked with [name] and recommended a wound Ortho surgical or podiatry consult for the rehab team. Attending provider physician at the rehab center would have to do this if not already done.

## 2021-02-03 ENCOUNTER — Ambulatory Visit (HOSPITAL_COMMUNITY)
Admission: RE | Admit: 2021-02-03 | Discharge: 2021-02-03 | Disposition: A | Discharge status: Home / Self Care | Payer: Medicare Other | Source: Ambulatory Visit | Attending: Cardiology | Admitting: Cardiology

## 2021-02-03 ENCOUNTER — Telehealth (HOSPITAL_COMMUNITY): Payer: Self-pay

## 2021-02-03 ENCOUNTER — Encounter (HOSPITAL_COMMUNITY): Payer: Self-pay | Admitting: Cardiology

## 2021-02-03 ENCOUNTER — Other Ambulatory Visit: Payer: Self-pay

## 2021-02-03 VITALS — BP 104/60 | HR 70

## 2021-02-03 DIAGNOSIS — J90 Pleural effusion, not elsewhere classified: Secondary | ICD-10-CM | POA: Diagnosis not present

## 2021-02-03 DIAGNOSIS — S81801A Unspecified open wound, right lower leg, initial encounter: Secondary | ICD-10-CM | POA: Diagnosis not present

## 2021-02-03 DIAGNOSIS — E1122 Type 2 diabetes mellitus with diabetic chronic kidney disease: Secondary | ICD-10-CM | POA: Insufficient documentation

## 2021-02-03 DIAGNOSIS — I5042 Chronic combined systolic (congestive) and diastolic (congestive) heart failure: Secondary | ICD-10-CM | POA: Insufficient documentation

## 2021-02-03 DIAGNOSIS — S91302A Unspecified open wound, left foot, initial encounter: Secondary | ICD-10-CM | POA: Diagnosis not present

## 2021-02-03 DIAGNOSIS — Z79899 Other long term (current) drug therapy: Secondary | ICD-10-CM | POA: Insufficient documentation

## 2021-02-03 DIAGNOSIS — N184 Chronic kidney disease, stage 4 (severe): Secondary | ICD-10-CM

## 2021-02-03 DIAGNOSIS — E11621 Type 2 diabetes mellitus with foot ulcer: Secondary | ICD-10-CM | POA: Diagnosis not present

## 2021-02-03 DIAGNOSIS — I255 Ischemic cardiomyopathy: Secondary | ICD-10-CM | POA: Diagnosis not present

## 2021-02-03 DIAGNOSIS — I517 Cardiomegaly: Secondary | ICD-10-CM | POA: Diagnosis not present

## 2021-02-03 DIAGNOSIS — L97519 Non-pressure chronic ulcer of other part of right foot with unspecified severity: Secondary | ICD-10-CM | POA: Insufficient documentation

## 2021-02-03 DIAGNOSIS — I2510 Atherosclerotic heart disease of native coronary artery without angina pectoris: Secondary | ICD-10-CM

## 2021-02-03 DIAGNOSIS — I480 Paroxysmal atrial fibrillation: Secondary | ICD-10-CM | POA: Diagnosis not present

## 2021-02-03 DIAGNOSIS — K219 Gastro-esophageal reflux disease without esophagitis: Secondary | ICD-10-CM | POA: Insufficient documentation

## 2021-02-03 DIAGNOSIS — Z7984 Long term (current) use of oral hypoglycemic drugs: Secondary | ICD-10-CM | POA: Diagnosis not present

## 2021-02-03 DIAGNOSIS — Z951 Presence of aortocoronary bypass graft: Secondary | ICD-10-CM | POA: Insufficient documentation

## 2021-02-03 DIAGNOSIS — J9811 Atelectasis: Secondary | ICD-10-CM | POA: Diagnosis not present

## 2021-02-03 DIAGNOSIS — Z7901 Long term (current) use of anticoagulants: Secondary | ICD-10-CM | POA: Diagnosis not present

## 2021-02-03 DIAGNOSIS — Z95810 Presence of automatic (implantable) cardiac defibrillator: Secondary | ICD-10-CM | POA: Diagnosis not present

## 2021-02-03 DIAGNOSIS — I350 Nonrheumatic aortic (valve) stenosis: Secondary | ICD-10-CM | POA: Diagnosis not present

## 2021-02-03 DIAGNOSIS — Z8616 Personal history of COVID-19: Secondary | ICD-10-CM | POA: Insufficient documentation

## 2021-02-03 DIAGNOSIS — I5022 Chronic systolic (congestive) heart failure: Secondary | ICD-10-CM

## 2021-02-03 DIAGNOSIS — I700 Atherosclerosis of aorta: Secondary | ICD-10-CM | POA: Diagnosis not present

## 2021-02-03 DIAGNOSIS — I130 Hypertensive heart and chronic kidney disease with heart failure and stage 1 through stage 4 chronic kidney disease, or unspecified chronic kidney disease: Secondary | ICD-10-CM | POA: Diagnosis not present

## 2021-02-03 DIAGNOSIS — I509 Heart failure, unspecified: Secondary | ICD-10-CM | POA: Diagnosis not present

## 2021-02-03 LAB — COMPREHENSIVE METABOLIC PANEL
ALT: 22 U/L (ref 0–44)
AST: 18 U/L (ref 15–41)
Albumin: 2.5 g/dL — ABNORMAL LOW (ref 3.5–5.0)
Alkaline Phosphatase: 75 U/L (ref 38–126)
Anion gap: 9 (ref 5–15)
BUN: 100 mg/dL — ABNORMAL HIGH (ref 8–23)
CO2: 25 mmol/L (ref 22–32)
Calcium: 8.5 mg/dL — ABNORMAL LOW (ref 8.9–10.3)
Chloride: 104 mmol/L (ref 98–111)
Creatinine, Ser: 2.92 mg/dL — ABNORMAL HIGH (ref 0.61–1.24)
GFR, Estimated: 20 mL/min — ABNORMAL LOW (ref >60)
Glucose, Bld: 50 mg/dL — ABNORMAL LOW (ref 70–99)
Potassium: 4.4 mmol/L (ref 3.5–5.1)
Sodium: 138 mmol/L (ref 135–145)
Total Bilirubin: 0.6 mg/dL (ref 0.3–1.2)
Total Protein: 5.4 g/dL — ABNORMAL LOW (ref 6.5–8.1)

## 2021-02-03 LAB — CBC
HCT: 34.3 % — ABNORMAL LOW (ref 39.0–52.0)
Hemoglobin: 9.8 g/dL — ABNORMAL LOW (ref 13.0–17.0)
MCH: 24 pg — ABNORMAL LOW (ref 26.0–34.0)
MCHC: 28.6 g/dL — ABNORMAL LOW (ref 30.0–36.0)
MCV: 84.1 fL (ref 80.0–100.0)
Platelets: 278 10*3/uL (ref 150–400)
RBC: 4.08 MIL/uL — ABNORMAL LOW (ref 4.22–5.81)
RDW: 19.1 % — ABNORMAL HIGH (ref 11.5–15.5)
WBC: 13.3 10*3/uL — ABNORMAL HIGH (ref 4.0–10.5)
nRBC: 0.2 % (ref 0.0–0.2)

## 2021-02-03 LAB — TSH: TSH: 2.401 u[IU]/mL (ref 0.350–4.500)

## 2021-02-03 LAB — SEDIMENTATION RATE: Sed Rate: 6 mm/hr (ref 0–16)

## 2021-02-03 MED ORDER — TORSEMIDE 40 MG PO TABS: 40.0000 mg | ORAL_TABLET | Freq: Every day | ORAL | 6 refills | Status: DC

## 2021-02-03 MED ORDER — TORSEMIDE 20 MG PO TABS: 20.0000 mg | ORAL_TABLET | Freq: Every day | ORAL | 3 refills | Status: DC

## 2021-02-03 MED ORDER — SPIRONOLACTONE 25 MG PO TABS: 12.5000 mg | ORAL_TABLET | Freq: Every day | ORAL | 3 refills | Status: AC

## 2021-02-03 MED ORDER — TORSEMIDE 40 MG PO TABS: 20.0000 mg | ORAL_TABLET | Freq: Every day | ORAL | 6 refills | Status: DC

## 2021-02-03 NOTE — Telephone Encounter (Signed)
Spoke with Licensed conveyancer April. Provided with instructions.  Recommendations also faxed to her.  Pt has f/u in 10 days for labs and office visit in 3 weeks.

## 2021-02-03 NOTE — Telephone Encounter (Signed)
-----   Message from Larey Dresser, MD sent at 02/03/2021  4:16 PM EST ----- BUN/creatinine higher.  Stop digoxin, stop dapagliflozin.  Decrease spironolactone to 12.5 mg daily.  Hold torsemide for a day and keep dose at 20 mg daily (do not increase to 40 mg daily).

## 2021-02-03 NOTE — Patient Instructions (Addendum)
Increase Torsemide to 40mg  Daily.  Labs done today, your results will be available in MyChart, we will contact you for abnormal readings.  Your physician recommends that you return for lab work in: 10 days.  You have been referred to Dr. Sharol Given. They will call you with your appointment.   Your physician recommends that you schedule a follow-up appointment in: 3 weeks with provider.   If you have any questions or concerns before your next appointment please send [place] a message through [place] or call our office at [phone].    TO LEAVE A MESSAGE FOR THE NURSE SELECT OPTION 2, PLEASE LEAVE A MESSAGE INCLUDING: YOUR NAME DATE OF BIRTH CALL BACK NUMBER REASON FOR CALL**this is important as we prioritize the call backs  YOU WILL RECEIVE A CALL BACK THE SAME DAY AS LONG AS YOU CALL BEFORE 4:00 PM  At the [facility], you and your health needs are our priority. As part of our continuing mission to provide you with exceptional heart care, we have created designated Provider Care Teams. These Care Teams include your primary Cardiologist (physician) and Advanced Practice Providers (APPs- Physician Assistants and Nurse Practitioners) who all work together to provide you with the care you need, when you need it.   You may see any of the following providers on your designated Care Team at your next follow up: Dr Glori Bickers Dr Haynes Kerns, NP Lyda Jester, [place] [facility] [place], [place] Audry Riles, PharmD   Please be sure to bring in all your medications bottles to every appointment.

## 2021-02-05 NOTE — Progress Notes (Signed)
Date:  02/05/2021   ID:  [name], DOB 06/02/1928, MRN [id number]  Provider location: [facility] Type of Visit: Established patient   PCP:  Burnis Medin, MD  Cardiologist:  Dr. [name]   History of Present Illness: [name] is a 87 y.o. male who has history of CAD s/p CABG, ischemic cardiomyopathy, and paroxysmal atrial fibrillation.  Patient was admitted in 2/16 with CHF and diuresed, he was sent home on Lasix 60 mg bid.  He missed 3 days of the pm Lasix dose and was re-admitted on 05/18/14 with acute on chronic systolic CHF with dyspnea and hypoxemia. He was diuresed with Lasix gtt and metolazone.  While in the hospital, he went into atrial fibrillation with RVR requiring cardioversion.  Creatinine was elevated and ramipril was stopped.  We had to cut back on Coreg due to hypotension and low output.  He was begun on low dose digoxin.    Echo in 7/20 showed EF 20-25% with LAD territory akinesis, mildly decreased RV systolic function, moderate AS with mild AI.  Echo was done today and reviewed, EF 20-25%, apical/peri-apical akinesis, mildly decreased RV systolic function, mild MR, mild AI, low gradient moderate AS with mean gradient 12 mmHg and AVA 1.1 cm^2, IVC normal.   Echo 5/22 showed EF 20-25%, severe LV dysfunction, moderately dilated LV and moderate LVH. Grade I DD, RV mildly reduced, mild MR, mild AI, low gradient moderate aortic valve stenosis with  mean gradient of 12.0 mmHg.  Admitted 12/24/20 with COVID + A/C combined systolic/diastolic heart failure. COVID treated with MAB (bebtelovimab). Received 2 doses of remdesivir. Diuresed with IV lasix and transitioned to torsemide. EKG 12/26/20 A fib. Discharged on 12/27/20.   Readmitted 01/01/21 with A/C CHF and Afib with RVR. Started on IV lasix and milrinone. Underwent DCCV to normal sinus rhythm on 10/21, but went back to A. fib with RVR and IV amiodarone started. Underwent repeat DCCV to normal  sinus rhythm on 01/11/21.  Unfortunately, he was back in atrial fibrillation a couple days later, however rate-controlled. IV lasix transitioned to po torsemide and milrinone weaned off. Coreg and hydralazine held with soft BP. He was discharged to [facility] skilled nursing facility for rehab on po amiodarone.   He returns today for followup.  He is currently at [facility]  He has a right foot wound and is not walking, gets around in wheelchair. He has been doing some PT that does not involve walking. He remains very weak. He is still in atrial fibrillation. He gets short of breath with transfers.  No chest pain.  Significant fatigue.  He has chest burning that he attributes to GERD.    ECG (personally reviewed): atrial fibrillation, BiV paced   Labs (3/16): K 4.2, creatinine 2.26, digoxin < 0.2 Labs (05/31/2014): TSH 2.3 Dig level 0.6  Labs (06/14/2014): K 4.5 Creatinine 2.14, LDL 28, HDL 46 Labs (6/16): K 4.5, creatinine 2.08, BNP 712 Labs (7/16): K 4.1, creatinine 2.18, HCT 36.8, LFTs normal, digoxin 0.9 Labs (10/16): LDL 26, HDL 38, TSH normal, LFTs normal Labs (12/16): K 4.6, creatinine 2 Labs (2/17): LFTs normal, digoxin 1.1 Labs (4/17): K 4.7, creatinine 1.97, BUN 50, hgb 12.8, TSH normal, digoxin 0.8, LDL 29 Labs (7/17): digoxin 0.6 Labs (11/17): K 4.4, creatinine 2.13 Labs (12/17): hgb 11.4 Labs (5/18): LFTs normal, K 4.6, creatinne 2.21, hgb 10.9 Labs (7/19): Cr 1.97, K 4.4, Hgb 13.2, Hgb A1c 7.1. LFTs normal.  LDL 25 Labs (9/19): K 4.2, creatinine 1.96 Labs (12/19): TSH normal, digoxin 0.7, LFTs normal, LDL 24 Labs (1/20): hgb 13.4 Labs (4/20): LFTs normal, digoxin 0.7, TSH normal Labs (5/20): K 4.1, creatinine 1.91 Labs (11/21): K 4.2, creatinine 1.92, LDL 29, HDL 39, TSH normal, LFTs normal Labs (5/22): K 4.1, creatinine 2.18, TSH normal, LFTs normal, hgb 12.1 Labs (10/22): K 4.0, creatinine 2.51 Labs (11/22): digoxin 0.5, hgb 9.9, K 4.2, creatinine 2.69  PMH: 1. CAD: S/p CABG  2005 with LIMA-Diagonal, SVG-LAD, SVG-LCx, SVG-RCA.  Cardiolite in 2/15 with EF 19%, no ischemia.  2. Ischemic cardiomyopathy: Echo (2/16) with EF 20-25%, severe LV dilation with RWMAs, moderate AI, moderate MR, moderately decreased RV systolic function.  St Jude CRT-D device.  - Echo 10/2016 LVEF 20-25% Grade 1 DD, Mild/Mod MR, mild LAE, Mildly reduced RV function, Mild RAE, Peak PA pressure 32 mmHg - Echo (7/20): EF 20-25%, LAD territory AK, normal RV size with mildly decreased systolic function, mild AI with moderate AS (mean gradient 24 mmHg).  - Echo (5/22): EF 20-25%, apical/peri-apical akinesis, mildly decreased RV systolic function, mild MR, mild AI, low gradient moderate AS with mean gradient 12 mmHg and AVA 1.1 cm^2, IVC normal.  3. Atrial fibrillation: Paroxysmal=>persistent.  DCCV 3/16.  DCCV x 2 in 10/22 failed.  4. CKD stage 3 5. Carotid stenosis: Carotid dopplers (1/16) with 60-79% RICA stenosis.  Carotids (1/17) with bilateral 40-59% ICA stenosis.  - Carotids (1/18): 40-59% BICA stenosis.  - Carotids (5/20): 40-59% RICA stenosis.  - Carotids (6/21): 40-59% RICA stenosis.  6. HTN 7. Hyperlipidemia 8. Restless leg syndrome. 9. Type 2 diabetes. 10. OA 11. Depression 12. H/o CCY 4. H/o appy 14. OSA: Uses CPAP.  15. ABIs (7/17): Normal 16. Melanoma: s/p excision. 17. Aortic stenosis: Moderate on 7/20 echo and on 5/22 echo.   18. Gout 19. COVID-19 10/22  Current Outpatient Medications  Medication Sig Dispense Refill   acetaminophen (TYLENOL) 650 MG CR tablet Take 650 mg by mouth 3 (three) times daily. As needed     allopurinol (ZYLOPRIM) 100 MG tablet TAKE 1 TABLET DAILY 30 tablet 11   amiodarone (PACERONE) 200 MG tablet Take 1 tablet (200 mg total) by mouth daily. 90 tablet 3   apixaban (ELIQUIS) 2.5 MG TABS tablet Take 1 tablet (2.5 mg total) by mouth 2 (two) times daily. 270 tablet 3   atorvastatin (LIPITOR) 40 MG tablet TAKE 1 TABLET DAILY 90 tablet 3   Blood Glucose  Monitoring Suppl (ACCU-CHEK AVIVA PLUS) w/Device KIT Check blood sugars daily up to three times daily or as needed. 1 kit prn   carvedilol (COREG) 6.25 MG tablet Take 1 tablet (6.25 mg total) by mouth 2 (two) times daily. 60 tablet 11   Chlorphen-Pseudoephed-APAP (CORICIDIN D PO) Take 1 tablet by mouth as needed.     docusate sodium (COLACE) 100 MG capsule Take 100 mg by mouth 2 (two) times daily.     escitalopram (LEXAPRO) 10 MG tablet Take 1 tablet (10 mg total) by mouth daily. Please schedule follow up with Dr. Regis Bill for refills. [phone] Thank you! 90 tablet 1   ferrous sulfate 325 (65 FE) MG tablet Take 325 mg by mouth daily with breakfast.     gabapentin (NEURONTIN) 100 MG capsule Take by mouth. Take 1 cap in AM and 2 caps at bedtime     glucose blood (ACCU-CHEK AVIVA PLUS) test strip Check blood sugars up to three times daily as needed. Please schedule your follow  up with labs with Dr. Regis Bill. [phone] (Patient taking differently: Check blood sugar bid) 300 strip 0   insulin glargine (LANTUS) 100 unit/mL SOPN Inject 30 Units into the skin at bedtime.      insulin lispro (HUMALOG) 100 UNIT/ML injection Inject into the skin 3 (three) times daily before meals.     Insulin Pen Needle (BD PEN NEEDLE NANO U/F) 32G X 4 MM MISC [place] TO TEST BLOOD SUGAR THREE TIMES DAILY (Patient taking differently: Check blood sugar twice daily) 300 each 1   isosorbide mononitrate (IMDUR) 30 MG 24 hr tablet Take 0.5 tablets (15 mg total) by mouth daily. 30 tablet 1   lidocaine (LIDODERM) 5 % Place 1 patch onto the skin daily. Remove & Discard patch within 12 hours or as directed by MD (Patient taking differently: Place 1 patch onto the skin daily as needed (shoulder pain). Remove & Discard patch within 12 hours or as directed by MD) 30 patch 3   ONETOUCH DELICA LANCETS FINE MISC Use to test blood sugar 2-3 times daily 300 each 1   OVER THE COUNTER MEDICATION Place 1 spray into both nostrils daily as needed (runny  nose). OTC nasal spray - unknown name     pramipexole (MIRAPEX) 0.5 MG tablet TAKE 1 TABLET 2 TO 3 HOURS BEFORE SLEEP FOR RESTLESS LEG (Patient taking differently: Take 0.5 mg by mouth See admin instructions. Take one tablet (0.5 mg) by mouth 2-3 hours before bedtime - for restless legs) 90 tablet 1   silodosin (RAPAFLO) 8 MG CAPS capsule TAKE 1 CAPSULE DAILY WITH BREAKFAST 90 capsule 1   spironolactone (ALDACTONE) 25 MG tablet Take 0.5 tablets (12.5 mg total) by mouth daily. 45 tablet 3   torsemide (DEMADEX) 20 MG tablet Take 1 tablet (20 mg total) by mouth daily. 90 tablet 3   No current facility-administered medications for this encounter.    Allergies:   Lisinopril   Social History:  The patient  reports that he quit smoking about 42 years ago. His smoking use included cigarettes. He has a 40.00 pack-year smoking history. He has quit using smokeless tobacco.  His smokeless tobacco use included chew. He reports that he does not drink alcohol and does not use drugs.   Family History:  The patient's family history includes COPD in his father; Healthy in his daughter, daughter, and daughter.   ROS:  Please see the history of present illness.   All other systems are personally reviewed and negative.   Exam:   BP 104/60   Pulse 70   SpO2 95%   General: NAD Neck: JVP 8-9 cm, no thyromegaly or thyroid nodule.  Lungs: Decreased left base.  CV: Nondisplaced PMI.  Heart regular S1/S2, no S3/S4, 2/6 early SEM RUSB.  1+ edema to knees.  No carotid bruit.  Difficult to palpate pedal pulses.  Abdomen: Soft, nontender, no hepatosplenomegaly, no distention.  Skin: Intact without lesions or rashes. Ulceration on plantar surface right foot.  Neurologic: Alert and oriented x 3.  Psych: Normal affect. Extremities: No clubbing or cyanosis.  HEENT: Normal.   Recent Labs: 01/06/2021: B Natriuretic Peptide 993.7 01/12/2021: Magnesium 1.9 02/03/2021: ALT 22; BUN 100; Creatinine, Ser 2.92; Hemoglobin  9.8; Platelets 278; Potassium 4.4; Sodium 138; TSH 2.401  Personally reviewed   Wt Readings from Last 3 Encounters:  01/27/21 77.2 kg (170 lb 1.6 oz)  01/13/21 75.2 kg (165 lb 12.6 oz)  12/27/20 76.9 kg (169 lb 8.5 oz)    ASSESSMENT AND PLAN:  1. Chronic systolic CHF: Ischemic cardiomyopathy.  Echo in 5/22 with EF 20-25%, low gradient moderate AS with mean gradient 12 mmHg and AVA 1.1. St Jude CRT-D device.  Recent CHF exacerbation in setting of recurrent atrial fibrillation and loss of effective CRT.  Stable NYHA IIIb, confounded by frailty and general physical deconditioning. He is mildly volume overloaded on exam.  - Continue carvedilol 6.25 mg bid. - Increase torsemide to 40 mg daily with BMET today and in 10 days.  Watch creatinine closely.  - Continue digoxin 0.0625 mg qMWF. Dig level today. - Continue Farxiga 10 mg daily.  - Continue spironolactone 25 daily.  - Continue Imdur 15 mg daily  - Hold hydralazine for now. - Will get CXR with possible left-sided effusion by exam.  2. Atrial fibrillation: Paroxysmal => persistent.  Went into AF on 12/25/20, likely triggered by COVID-19 infection.  Loss of effective CRT with AF.  DC-CV on 01/06/21 and 01/11/21 but went back into atrial fibrillation. Afib today on ECG. Rate controlled.  - At this point, think he is going to stay in AF.  I think he can stop amiodarone.  - Continue Coreg.  - On Eliquis 2.5 mg bid, CBC today. No abnormal  bleeding. 3. CKD stage 4: BMET today. Follow carefully with diuresis.  4. CAD: s/p CABG.  No chest pain.   - Continue atorvastatin. - Not on ASA with stable CAD and apixaban use.  5. Deconditioning: Continue PT at facility. 6. Foot wound: Still with significant right foot ulcer.  He says that he had plain films at his SNF.  - Check CBC and ESR.  - Refer to Dr. Sharol Given for evaluation.  7. GERD symptoms: Start Protonix.    Followup 3 wks with APP.  Patient is very frail, may need to think about DNR/hospice soon.    [name] 02/05/2021

## 2021-02-07 ENCOUNTER — Ambulatory Visit (INDEPENDENT_AMBULATORY_CARE_PROVIDER_SITE_OTHER): Payer: Medicare Other | Admitting: Orthopedic Surgery

## 2021-02-07 ENCOUNTER — Encounter: Payer: Self-pay | Admitting: Orthopedic Surgery

## 2021-02-07 ENCOUNTER — Other Ambulatory Visit: Payer: Self-pay

## 2021-02-07 ENCOUNTER — Ambulatory Visit (INDEPENDENT_AMBULATORY_CARE_PROVIDER_SITE_OTHER): Payer: Medicare Other

## 2021-02-07 DIAGNOSIS — M79671 Pain in right foot: Secondary | ICD-10-CM

## 2021-02-07 DIAGNOSIS — M86141 Other acute osteomyelitis, right hand: Secondary | ICD-10-CM

## 2021-02-07 DIAGNOSIS — I255 Ischemic cardiomyopathy: Secondary | ICD-10-CM

## 2021-02-07 DIAGNOSIS — I872 Venous insufficiency (chronic) (peripheral): Secondary | ICD-10-CM | POA: Diagnosis not present
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
## 2021-02-08 ENCOUNTER — Telehealth (HOSPITAL_COMMUNITY): Payer: Self-pay

## 2021-02-08 DIAGNOSIS — E118 Type 2 diabetes mellitus with unspecified complications: Secondary | ICD-10-CM | POA: Diagnosis not present

## 2021-02-08 DIAGNOSIS — I5023 Acute on chronic systolic (congestive) heart failure: Secondary | ICD-10-CM | POA: Diagnosis not present

## 2021-02-08 DIAGNOSIS — E1140 Type 2 diabetes mellitus with diabetic neuropathy, unspecified: Secondary | ICD-10-CM | POA: Diagnosis not present

## 2021-02-08 DIAGNOSIS — I480 Paroxysmal atrial fibrillation: Secondary | ICD-10-CM | POA: Diagnosis not present

## 2021-02-08 DIAGNOSIS — N183 Chronic kidney disease, stage 3 unspecified: Secondary | ICD-10-CM | POA: Diagnosis not present

## 2021-02-08 DIAGNOSIS — I429 Cardiomyopathy, unspecified: Secondary | ICD-10-CM | POA: Diagnosis not present

## 2021-02-08 DIAGNOSIS — Z794 Long term (current) use of insulin: Secondary | ICD-10-CM | POA: Diagnosis not present

## 2021-02-08 DIAGNOSIS — J189 Pneumonia, unspecified organism: Secondary | ICD-10-CM | POA: Diagnosis not present

## 2021-02-08 DIAGNOSIS — R404 Transient alteration of awareness: Secondary | ICD-10-CM | POA: Diagnosis not present

## 2021-02-08 DIAGNOSIS — R6889 Other general symptoms and signs: Secondary | ICD-10-CM | POA: Diagnosis not present

## 2021-02-08 DIAGNOSIS — R609 Edema, unspecified: Secondary | ICD-10-CM | POA: Diagnosis not present

## 2021-02-08 DIAGNOSIS — I2510 Atherosclerotic heart disease of native coronary artery without angina pectoris: Secondary | ICD-10-CM | POA: Diagnosis not present

## 2021-02-08 DIAGNOSIS — R739 Hyperglycemia, unspecified: Secondary | ICD-10-CM | POA: Diagnosis not present

## 2021-02-08 DIAGNOSIS — J9621 Acute and chronic respiratory failure with hypoxia: Secondary | ICD-10-CM | POA: Diagnosis not present

## 2021-02-08 DIAGNOSIS — Z7409 Other reduced mobility: Secondary | ICD-10-CM | POA: Diagnosis not present

## 2021-02-08 DIAGNOSIS — Z789 Other specified health status: Secondary | ICD-10-CM | POA: Diagnosis not present

## 2021-02-08 DIAGNOSIS — J9690 Respiratory failure, unspecified, unspecified whether with hypoxia or hypercapnia: Secondary | ICD-10-CM | POA: Diagnosis not present

## 2021-02-08 DIAGNOSIS — E1159 Type 2 diabetes mellitus with other circulatory complications: Secondary | ICD-10-CM | POA: Diagnosis not present

## 2021-02-08 DIAGNOSIS — R339 Retention of urine, unspecified: Secondary | ICD-10-CM | POA: Diagnosis not present

## 2021-02-08 DIAGNOSIS — L89890 Pressure ulcer of other site, unstageable: Secondary | ICD-10-CM | POA: Diagnosis not present

## 2021-02-08 DIAGNOSIS — I499 Cardiac arrhythmia, unspecified: Secondary | ICD-10-CM | POA: Diagnosis not present

## 2021-02-08 DIAGNOSIS — Z743 Need for continuous supervision: Secondary | ICD-10-CM | POA: Diagnosis not present

## 2021-02-08 DIAGNOSIS — N1832 Chronic kidney disease, stage 3b: Secondary | ICD-10-CM | POA: Diagnosis not present

## 2021-02-08 DIAGNOSIS — D692 Other nonthrombocytopenic purpura: Secondary | ICD-10-CM | POA: Diagnosis not present

## 2021-02-08 DIAGNOSIS — Z7901 Long term (current) use of anticoagulants: Secondary | ICD-10-CM | POA: Diagnosis not present

## 2021-02-08 DIAGNOSIS — G4733 Obstructive sleep apnea (adult) (pediatric): Secondary | ICD-10-CM | POA: Diagnosis not present

## 2021-02-08 DIAGNOSIS — J209 Acute bronchitis, unspecified: Secondary | ICD-10-CM | POA: Diagnosis not present

## 2021-02-08 DIAGNOSIS — D509 Iron deficiency anemia, unspecified: Secondary | ICD-10-CM | POA: Diagnosis not present

## 2021-02-08 DIAGNOSIS — I130 Hypertensive heart and chronic kidney disease with heart failure and stage 1 through stage 4 chronic kidney disease, or unspecified chronic kidney disease: Secondary | ICD-10-CM | POA: Diagnosis not present

## 2021-02-08 DIAGNOSIS — I5022 Chronic systolic (congestive) heart failure: Secondary | ICD-10-CM | POA: Diagnosis not present

## 2021-02-08 DIAGNOSIS — M109 Gout, unspecified: Secondary | ICD-10-CM | POA: Diagnosis not present

## 2021-02-08 DIAGNOSIS — F4320 Adjustment disorder, unspecified: Secondary | ICD-10-CM | POA: Diagnosis not present

## 2021-02-08 DIAGNOSIS — M869 Osteomyelitis, unspecified: Secondary | ICD-10-CM | POA: Diagnosis not present

## 2021-02-08 DIAGNOSIS — I482 Chronic atrial fibrillation, unspecified: Secondary | ICD-10-CM | POA: Diagnosis not present

## 2021-02-08 MED ORDER — TORSEMIDE 20 MG PO TABS: 20.0000 mg | ORAL_TABLET | Freq: Every day | ORAL | 3 refills | Status: AC

## 2021-02-08 NOTE — Telephone Encounter (Signed)
Updated RX for 20 mg Daily was sent to [facility] on 02/03/21 so not sure why the didn't receive, new rx sent again

## 2021-02-08 NOTE — Telephone Encounter (Signed)
[facility] called with questions about Torosemide 40mg , this is not covered and requires prior auth, unless patient can take two 20mg  tablets.  [facility].  REF# [id number]

## 2021-02-13 DIAGNOSIS — I2510 Atherosclerotic heart disease of native coronary artery without angina pectoris: Secondary | ICD-10-CM | POA: Diagnosis not present

## 2021-02-13 DIAGNOSIS — J9621 Acute and chronic respiratory failure with hypoxia: Secondary | ICD-10-CM | POA: Diagnosis not present

## 2021-02-13 DIAGNOSIS — J189 Pneumonia, unspecified organism: Secondary | ICD-10-CM | POA: Diagnosis not present

## 2021-02-13 DIAGNOSIS — I482 Chronic atrial fibrillation, unspecified: Secondary | ICD-10-CM | POA: Diagnosis not present

## 2021-02-13 DIAGNOSIS — I5022 Chronic systolic (congestive) heart failure: Secondary | ICD-10-CM | POA: Diagnosis not present

## 2021-02-13 DIAGNOSIS — Z789 Other specified health status: Secondary | ICD-10-CM | POA: Diagnosis not present

## 2021-02-13 DIAGNOSIS — Z7409 Other reduced mobility: Secondary | ICD-10-CM | POA: Diagnosis not present

## 2021-02-14 DIAGNOSIS — I499 Cardiac arrhythmia, unspecified: Secondary | ICD-10-CM | POA: Diagnosis not present

## 2021-02-14 DIAGNOSIS — Z743 Need for continuous supervision: Secondary | ICD-10-CM | POA: Diagnosis not present

## 2021-02-14 DIAGNOSIS — R739 Hyperglycemia, unspecified: Secondary | ICD-10-CM | POA: Diagnosis not present

## 2021-02-14 DIAGNOSIS — R404 Transient alteration of awareness: Secondary | ICD-10-CM | POA: Diagnosis not present

## 2021-02-14 DIAGNOSIS — R6889 Other general symptoms and signs: Secondary | ICD-10-CM | POA: Diagnosis not present

## 2021-02-15 ENCOUNTER — Telehealth: Payer: Self-pay | Admitting: Orthopedic Surgery

## 2021-02-15 NOTE — Telephone Encounter (Signed)
Pt wife called. Pt has passed away.   CB [phone]

## 2021-02-16 ENCOUNTER — Ambulatory Visit: Payer: No Typology Code available for payment source | Admitting: Orthopedic Surgery

## 2021-02-16 DEATH — deceased

## 2021-02-23 ENCOUNTER — Encounter (HOSPITAL_COMMUNITY): Payer: No Typology Code available for payment source

## 2021-04-13 ENCOUNTER — Encounter: Payer: Medicare Other | Admitting: Internal Medicine

## 2021-11-29 ENCOUNTER — Ambulatory Visit: Payer: Medicare Other
